# Patient Record
Sex: Female | Born: 1935 | Race: Black or African American | Hispanic: No | Smoking: Never smoker
Health system: Southern US, Community
[De-identification: ages and names within clinical notes are randomized; demographics above are authoritative.]

## PROBLEM LIST (undated history)

## (undated) ENCOUNTER — Emergency Department (HOSPITAL_COMMUNITY): Admission: EM | Payer: PRIVATE HEALTH INSURANCE | Source: Home / Self Care

## (undated) DIAGNOSIS — E785 Hyperlipidemia, unspecified: Secondary | ICD-10-CM

## (undated) DIAGNOSIS — I639 Cerebral infarction, unspecified: Secondary | ICD-10-CM

## (undated) DIAGNOSIS — E119 Type 2 diabetes mellitus without complications: Secondary | ICD-10-CM

## (undated) DIAGNOSIS — Z86718 Personal history of other venous thrombosis and embolism: Secondary | ICD-10-CM

## (undated) DIAGNOSIS — H409 Unspecified glaucoma: Secondary | ICD-10-CM

## (undated) DIAGNOSIS — D649 Anemia, unspecified: Secondary | ICD-10-CM

## (undated) DIAGNOSIS — H5440 Blindness, one eye, unspecified eye: Secondary | ICD-10-CM

## (undated) DIAGNOSIS — M109 Gout, unspecified: Secondary | ICD-10-CM

## (undated) DIAGNOSIS — N183 Chronic kidney disease, stage 3 unspecified: Secondary | ICD-10-CM

## (undated) DIAGNOSIS — I10 Essential (primary) hypertension: Secondary | ICD-10-CM

## (undated) DIAGNOSIS — N189 Chronic kidney disease, unspecified: Secondary | ICD-10-CM

## (undated) DIAGNOSIS — K8061 Calculus of gallbladder and bile duct with cholecystitis, unspecified, with obstruction: Secondary | ICD-10-CM

## (undated) HISTORY — DX: Chronic kidney disease, unspecified: N18.9

## (undated) HISTORY — DX: Essential (primary) hypertension: I10

## (undated) HISTORY — DX: Cerebral infarction, unspecified: I63.9

## (undated) HISTORY — DX: Blindness, one eye, unspecified eye: H54.40

## (undated) HISTORY — DX: Personal history of other venous thrombosis and embolism: Z86.718

## (undated) HISTORY — PX: CATARACT EXTRACTION: SUR2

## (undated) HISTORY — PX: ABDOMINAL HYSTERECTOMY: SHX81

## (undated) HISTORY — DX: Unspecified glaucoma: H40.9

## (undated) HISTORY — DX: Hyperlipidemia, unspecified: E78.5

## (undated) HISTORY — DX: Anemia, unspecified: D64.9

---

## 1989-03-26 DIAGNOSIS — I639 Cerebral infarction, unspecified: Secondary | ICD-10-CM

## 1989-03-26 HISTORY — DX: Cerebral infarction, unspecified: I63.9

## 1997-11-01 ENCOUNTER — Encounter: Admission: RE | Admit: 1997-11-01 | Discharge: 1997-11-01 | Payer: Self-pay | Admitting: Internal Medicine

## 1997-11-11 ENCOUNTER — Encounter: Admission: RE | Admit: 1997-11-11 | Discharge: 1997-11-11 | Payer: Self-pay | Admitting: Internal Medicine

## 1997-11-22 ENCOUNTER — Encounter: Admission: RE | Admit: 1997-11-22 | Discharge: 1997-11-22 | Payer: Self-pay | Admitting: Internal Medicine

## 1997-12-24 ENCOUNTER — Encounter: Admission: RE | Admit: 1997-12-24 | Discharge: 1997-12-24 | Payer: Self-pay | Admitting: Internal Medicine

## 1998-02-17 ENCOUNTER — Encounter: Admission: RE | Admit: 1998-02-17 | Discharge: 1998-02-17 | Payer: Self-pay | Admitting: Internal Medicine

## 1998-02-28 ENCOUNTER — Encounter: Admission: RE | Admit: 1998-02-28 | Discharge: 1998-02-28 | Payer: Self-pay | Admitting: Internal Medicine

## 1998-04-18 ENCOUNTER — Encounter: Admission: RE | Admit: 1998-04-18 | Discharge: 1998-04-18 | Payer: Self-pay | Admitting: Internal Medicine

## 1998-04-28 ENCOUNTER — Ambulatory Visit (HOSPITAL_COMMUNITY): Admission: RE | Admit: 1998-04-28 | Discharge: 1998-04-28 | Payer: Self-pay | Admitting: Internal Medicine

## 1998-05-06 ENCOUNTER — Encounter: Payer: Self-pay | Admitting: Hematology and Oncology

## 1998-05-06 ENCOUNTER — Ambulatory Visit (HOSPITAL_COMMUNITY): Admission: RE | Admit: 1998-05-06 | Discharge: 1998-05-06 | Payer: Self-pay | Admitting: Hematology and Oncology

## 1998-05-06 ENCOUNTER — Encounter: Admission: RE | Admit: 1998-05-06 | Discharge: 1998-05-06 | Payer: Self-pay | Admitting: Hematology and Oncology

## 1998-07-08 ENCOUNTER — Encounter: Admission: RE | Admit: 1998-07-08 | Discharge: 1998-07-08 | Payer: Self-pay | Admitting: Internal Medicine

## 1998-08-11 ENCOUNTER — Encounter: Admission: RE | Admit: 1998-08-11 | Discharge: 1998-08-11 | Payer: Self-pay | Admitting: Internal Medicine

## 1998-10-15 ENCOUNTER — Encounter: Admission: RE | Admit: 1998-10-15 | Discharge: 1998-10-15 | Payer: Self-pay | Admitting: Internal Medicine

## 1998-12-11 ENCOUNTER — Encounter: Admission: RE | Admit: 1998-12-11 | Discharge: 1998-12-11 | Payer: Self-pay | Admitting: Internal Medicine

## 1998-12-31 ENCOUNTER — Encounter: Payer: Self-pay | Admitting: Internal Medicine

## 1998-12-31 ENCOUNTER — Ambulatory Visit (HOSPITAL_COMMUNITY): Admission: RE | Admit: 1998-12-31 | Discharge: 1998-12-31 | Payer: Self-pay

## 1999-01-26 ENCOUNTER — Encounter: Admission: RE | Admit: 1999-01-26 | Discharge: 1999-01-26 | Payer: Self-pay | Admitting: Internal Medicine

## 1999-02-24 ENCOUNTER — Encounter: Admission: RE | Admit: 1999-02-24 | Discharge: 1999-02-24 | Payer: Self-pay | Admitting: Hematology and Oncology

## 1999-03-11 ENCOUNTER — Encounter: Admission: RE | Admit: 1999-03-11 | Discharge: 1999-03-11 | Payer: Self-pay | Admitting: *Deleted

## 1999-04-22 ENCOUNTER — Encounter: Admission: RE | Admit: 1999-04-22 | Discharge: 1999-04-22 | Payer: Self-pay | Admitting: Hematology and Oncology

## 1999-05-29 ENCOUNTER — Encounter: Admission: RE | Admit: 1999-05-29 | Discharge: 1999-05-29 | Payer: Self-pay | Admitting: Internal Medicine

## 1999-06-12 ENCOUNTER — Encounter: Admission: RE | Admit: 1999-06-12 | Discharge: 1999-06-12 | Payer: Self-pay | Admitting: Hematology and Oncology

## 1999-07-13 ENCOUNTER — Encounter: Admission: RE | Admit: 1999-07-13 | Discharge: 1999-07-13 | Payer: Self-pay | Admitting: Internal Medicine

## 1999-08-21 ENCOUNTER — Encounter: Admission: RE | Admit: 1999-08-21 | Discharge: 1999-08-21 | Payer: Self-pay | Admitting: Internal Medicine

## 1999-09-02 ENCOUNTER — Encounter: Admission: RE | Admit: 1999-09-02 | Discharge: 1999-09-02 | Payer: Self-pay | Admitting: Internal Medicine

## 1999-09-11 ENCOUNTER — Encounter: Admission: RE | Admit: 1999-09-11 | Discharge: 1999-09-11 | Payer: Self-pay | Admitting: Internal Medicine

## 1999-09-20 ENCOUNTER — Inpatient Hospital Stay (HOSPITAL_COMMUNITY): Admission: EM | Admit: 1999-09-20 | Discharge: 1999-09-21 | Payer: Self-pay | Admitting: Emergency Medicine

## 1999-09-20 ENCOUNTER — Encounter: Payer: Self-pay | Admitting: Emergency Medicine

## 1999-11-02 ENCOUNTER — Encounter: Admission: RE | Admit: 1999-11-02 | Discharge: 1999-11-02 | Payer: Self-pay | Admitting: Internal Medicine

## 1999-12-24 ENCOUNTER — Encounter: Admission: RE | Admit: 1999-12-24 | Discharge: 1999-12-24 | Payer: Self-pay | Admitting: Internal Medicine

## 2000-02-11 ENCOUNTER — Encounter: Admission: RE | Admit: 2000-02-11 | Discharge: 2000-02-11 | Payer: Self-pay | Admitting: Hematology and Oncology

## 2000-03-24 ENCOUNTER — Encounter: Admission: RE | Admit: 2000-03-24 | Discharge: 2000-03-24 | Payer: Self-pay | Admitting: Hematology and Oncology

## 2000-03-31 ENCOUNTER — Emergency Department (HOSPITAL_COMMUNITY): Admission: EM | Admit: 2000-03-31 | Discharge: 2000-03-31 | Payer: Self-pay | Admitting: Emergency Medicine

## 2000-03-31 ENCOUNTER — Encounter: Payer: Self-pay | Admitting: Emergency Medicine

## 2000-04-04 ENCOUNTER — Encounter: Admission: RE | Admit: 2000-04-04 | Discharge: 2000-04-04 | Payer: Self-pay | Admitting: Internal Medicine

## 2000-04-25 ENCOUNTER — Encounter: Admission: RE | Admit: 2000-04-25 | Discharge: 2000-04-25 | Payer: Self-pay | Admitting: Internal Medicine

## 2000-05-26 ENCOUNTER — Encounter: Admission: RE | Admit: 2000-05-26 | Discharge: 2000-05-26 | Payer: Self-pay | Admitting: Internal Medicine

## 2000-06-06 ENCOUNTER — Encounter: Admission: RE | Admit: 2000-06-06 | Discharge: 2000-06-06 | Payer: Self-pay | Admitting: Hematology and Oncology

## 2000-06-23 ENCOUNTER — Encounter: Admission: RE | Admit: 2000-06-23 | Discharge: 2000-06-23 | Payer: Self-pay | Admitting: Internal Medicine

## 2000-07-07 ENCOUNTER — Encounter: Admission: RE | Admit: 2000-07-07 | Discharge: 2000-07-07 | Payer: Self-pay | Admitting: Internal Medicine

## 2000-08-01 ENCOUNTER — Encounter: Admission: RE | Admit: 2000-08-01 | Discharge: 2000-08-01 | Payer: Self-pay | Admitting: Internal Medicine

## 2000-09-01 ENCOUNTER — Encounter: Admission: RE | Admit: 2000-09-01 | Discharge: 2000-09-01 | Payer: Self-pay | Admitting: Internal Medicine

## 2000-10-03 ENCOUNTER — Encounter: Admission: RE | Admit: 2000-10-03 | Discharge: 2000-10-03 | Payer: Self-pay | Admitting: Internal Medicine

## 2000-11-03 ENCOUNTER — Encounter: Admission: RE | Admit: 2000-11-03 | Discharge: 2000-11-03 | Payer: Self-pay

## 2000-12-22 ENCOUNTER — Encounter: Admission: RE | Admit: 2000-12-22 | Discharge: 2000-12-22 | Payer: Self-pay | Admitting: Internal Medicine

## 2001-01-09 ENCOUNTER — Encounter: Admission: RE | Admit: 2001-01-09 | Discharge: 2001-01-09 | Payer: Self-pay | Admitting: Internal Medicine

## 2001-02-06 ENCOUNTER — Encounter: Admission: RE | Admit: 2001-02-06 | Discharge: 2001-02-06 | Payer: Self-pay | Admitting: Internal Medicine

## 2001-02-22 ENCOUNTER — Encounter: Admission: RE | Admit: 2001-02-22 | Discharge: 2001-02-22 | Payer: Self-pay | Admitting: Internal Medicine

## 2001-03-01 ENCOUNTER — Encounter: Admission: RE | Admit: 2001-03-01 | Discharge: 2001-03-01 | Payer: Self-pay | Admitting: Internal Medicine

## 2001-03-03 ENCOUNTER — Ambulatory Visit (HOSPITAL_COMMUNITY): Admission: RE | Admit: 2001-03-03 | Discharge: 2001-03-03 | Payer: Self-pay | Admitting: Internal Medicine

## 2001-03-09 ENCOUNTER — Encounter: Payer: Self-pay | Admitting: Obstetrics

## 2001-03-09 ENCOUNTER — Encounter: Admission: RE | Admit: 2001-03-09 | Discharge: 2001-03-09 | Payer: Self-pay | Admitting: Obstetrics

## 2001-03-13 ENCOUNTER — Encounter: Admission: RE | Admit: 2001-03-13 | Discharge: 2001-03-13 | Payer: Self-pay | Admitting: Internal Medicine

## 2001-03-30 ENCOUNTER — Encounter: Admission: RE | Admit: 2001-03-30 | Discharge: 2001-03-30 | Payer: Self-pay | Admitting: Internal Medicine

## 2001-05-01 ENCOUNTER — Encounter: Admission: RE | Admit: 2001-05-01 | Discharge: 2001-05-01 | Payer: Self-pay | Admitting: Internal Medicine

## 2001-05-22 ENCOUNTER — Encounter: Admission: RE | Admit: 2001-05-22 | Discharge: 2001-05-22 | Payer: Self-pay | Admitting: Internal Medicine

## 2001-06-12 ENCOUNTER — Encounter: Admission: RE | Admit: 2001-06-12 | Discharge: 2001-06-12 | Payer: Self-pay | Admitting: Internal Medicine

## 2001-06-19 ENCOUNTER — Encounter: Admission: RE | Admit: 2001-06-19 | Discharge: 2001-06-19 | Payer: Self-pay | Admitting: Internal Medicine

## 2001-06-26 ENCOUNTER — Encounter: Admission: RE | Admit: 2001-06-26 | Discharge: 2001-06-26 | Payer: Self-pay | Admitting: Internal Medicine

## 2001-07-17 ENCOUNTER — Encounter: Admission: RE | Admit: 2001-07-17 | Discharge: 2001-07-17 | Payer: Self-pay | Admitting: Internal Medicine

## 2001-07-24 ENCOUNTER — Encounter: Admission: RE | Admit: 2001-07-24 | Discharge: 2001-07-24 | Payer: Self-pay

## 2001-08-14 ENCOUNTER — Encounter: Admission: RE | Admit: 2001-08-14 | Discharge: 2001-08-14 | Payer: Self-pay

## 2001-08-28 ENCOUNTER — Encounter: Admission: RE | Admit: 2001-08-28 | Discharge: 2001-08-28 | Payer: Self-pay | Admitting: Internal Medicine

## 2001-09-18 ENCOUNTER — Encounter: Admission: RE | Admit: 2001-09-18 | Discharge: 2001-09-18 | Payer: Self-pay | Admitting: Internal Medicine

## 2001-09-25 ENCOUNTER — Encounter: Admission: RE | Admit: 2001-09-25 | Discharge: 2001-09-25 | Payer: Self-pay | Admitting: Internal Medicine

## 2001-10-04 ENCOUNTER — Encounter: Admission: RE | Admit: 2001-10-04 | Discharge: 2001-10-04 | Payer: Self-pay | Admitting: *Deleted

## 2001-10-05 ENCOUNTER — Encounter: Admission: RE | Admit: 2001-10-05 | Discharge: 2001-10-05 | Payer: Self-pay

## 2001-10-30 ENCOUNTER — Encounter: Admission: RE | Admit: 2001-10-30 | Discharge: 2001-10-30 | Payer: Self-pay | Admitting: Internal Medicine

## 2001-11-21 ENCOUNTER — Ambulatory Visit (HOSPITAL_COMMUNITY): Admission: RE | Admit: 2001-11-21 | Discharge: 2001-11-21 | Payer: Self-pay | Admitting: *Deleted

## 2001-11-21 ENCOUNTER — Encounter: Payer: Self-pay | Admitting: *Deleted

## 2001-11-27 ENCOUNTER — Encounter: Admission: RE | Admit: 2001-11-27 | Discharge: 2001-11-27 | Payer: Self-pay | Admitting: Internal Medicine

## 2002-01-04 ENCOUNTER — Encounter: Admission: RE | Admit: 2002-01-04 | Discharge: 2002-01-04 | Payer: Self-pay | Admitting: Internal Medicine

## 2002-01-08 ENCOUNTER — Encounter: Admission: RE | Admit: 2002-01-08 | Discharge: 2002-01-08 | Payer: Self-pay | Admitting: Internal Medicine

## 2002-02-05 ENCOUNTER — Encounter: Admission: RE | Admit: 2002-02-05 | Discharge: 2002-02-05 | Payer: Self-pay | Admitting: Internal Medicine

## 2002-02-19 ENCOUNTER — Encounter: Admission: RE | Admit: 2002-02-19 | Discharge: 2002-02-19 | Payer: Self-pay | Admitting: Internal Medicine

## 2002-03-19 ENCOUNTER — Encounter: Admission: RE | Admit: 2002-03-19 | Discharge: 2002-03-19 | Payer: Self-pay | Admitting: Internal Medicine

## 2002-05-10 ENCOUNTER — Encounter: Admission: RE | Admit: 2002-05-10 | Discharge: 2002-05-10 | Payer: Self-pay | Admitting: Internal Medicine

## 2002-05-21 ENCOUNTER — Encounter: Admission: RE | Admit: 2002-05-21 | Discharge: 2002-05-21 | Payer: Self-pay | Admitting: Internal Medicine

## 2002-06-11 ENCOUNTER — Encounter: Admission: RE | Admit: 2002-06-11 | Discharge: 2002-06-11 | Payer: Self-pay | Admitting: Internal Medicine

## 2002-06-25 ENCOUNTER — Encounter: Admission: RE | Admit: 2002-06-25 | Discharge: 2002-06-25 | Payer: Self-pay | Admitting: Internal Medicine

## 2002-06-27 ENCOUNTER — Encounter: Admission: RE | Admit: 2002-06-27 | Discharge: 2002-09-25 | Payer: Self-pay | Admitting: *Deleted

## 2002-07-09 ENCOUNTER — Encounter: Admission: RE | Admit: 2002-07-09 | Discharge: 2002-07-09 | Payer: Self-pay | Admitting: Internal Medicine

## 2002-09-18 ENCOUNTER — Encounter: Admission: RE | Admit: 2002-09-18 | Discharge: 2002-09-18 | Payer: Self-pay | Admitting: Internal Medicine

## 2002-09-27 ENCOUNTER — Encounter: Admission: RE | Admit: 2002-09-27 | Discharge: 2002-09-27 | Payer: Self-pay | Admitting: Internal Medicine

## 2002-10-02 ENCOUNTER — Ambulatory Visit (HOSPITAL_COMMUNITY): Admission: RE | Admit: 2002-10-02 | Discharge: 2002-10-02 | Payer: Self-pay | Admitting: Internal Medicine

## 2002-10-02 ENCOUNTER — Encounter (INDEPENDENT_AMBULATORY_CARE_PROVIDER_SITE_OTHER): Payer: Self-pay | Admitting: Cardiology

## 2002-10-04 ENCOUNTER — Encounter: Admission: RE | Admit: 2002-10-04 | Discharge: 2002-10-04 | Payer: Self-pay | Admitting: Internal Medicine

## 2002-11-05 ENCOUNTER — Encounter: Admission: RE | Admit: 2002-11-05 | Discharge: 2002-11-05 | Payer: Self-pay | Admitting: Infectious Diseases

## 2002-11-26 ENCOUNTER — Encounter: Admission: RE | Admit: 2002-11-26 | Discharge: 2002-11-26 | Payer: Self-pay | Admitting: Internal Medicine

## 2002-12-24 ENCOUNTER — Encounter: Admission: RE | Admit: 2002-12-24 | Discharge: 2002-12-24 | Payer: Self-pay | Admitting: Internal Medicine

## 2003-01-21 ENCOUNTER — Encounter: Admission: RE | Admit: 2003-01-21 | Discharge: 2003-01-21 | Payer: Self-pay | Admitting: Internal Medicine

## 2003-02-11 ENCOUNTER — Encounter: Admission: RE | Admit: 2003-02-11 | Discharge: 2003-02-11 | Payer: Self-pay | Admitting: Internal Medicine

## 2003-03-11 ENCOUNTER — Encounter: Admission: RE | Admit: 2003-03-11 | Discharge: 2003-03-11 | Payer: Self-pay | Admitting: Internal Medicine

## 2003-04-18 ENCOUNTER — Encounter: Admission: RE | Admit: 2003-04-18 | Discharge: 2003-04-18 | Payer: Self-pay | Admitting: Internal Medicine

## 2003-04-24 ENCOUNTER — Ambulatory Visit (HOSPITAL_COMMUNITY): Admission: RE | Admit: 2003-04-24 | Discharge: 2003-04-24 | Payer: Self-pay | Admitting: Internal Medicine

## 2003-05-01 ENCOUNTER — Ambulatory Visit (HOSPITAL_COMMUNITY): Admission: RE | Admit: 2003-05-01 | Discharge: 2003-05-01 | Payer: Self-pay | Admitting: Internal Medicine

## 2003-05-06 ENCOUNTER — Encounter: Payer: Self-pay | Admitting: Internal Medicine

## 2003-05-06 ENCOUNTER — Ambulatory Visit (HOSPITAL_COMMUNITY): Admission: RE | Admit: 2003-05-06 | Discharge: 2003-05-06 | Payer: Self-pay | Admitting: Internal Medicine

## 2003-05-16 ENCOUNTER — Encounter: Admission: RE | Admit: 2003-05-16 | Discharge: 2003-05-16 | Payer: Self-pay | Admitting: Internal Medicine

## 2003-05-24 ENCOUNTER — Encounter: Admission: RE | Admit: 2003-05-24 | Discharge: 2003-05-24 | Payer: Self-pay | Admitting: Internal Medicine

## 2003-05-29 ENCOUNTER — Encounter: Admission: RE | Admit: 2003-05-29 | Discharge: 2003-05-29 | Payer: Self-pay | Admitting: Internal Medicine

## 2003-06-13 ENCOUNTER — Encounter: Admission: RE | Admit: 2003-06-13 | Discharge: 2003-06-13 | Payer: Self-pay | Admitting: Internal Medicine

## 2003-06-24 ENCOUNTER — Encounter: Admission: RE | Admit: 2003-06-24 | Discharge: 2003-06-24 | Payer: Self-pay | Admitting: Internal Medicine

## 2003-07-15 ENCOUNTER — Encounter: Admission: RE | Admit: 2003-07-15 | Discharge: 2003-07-15 | Payer: Self-pay | Admitting: Internal Medicine

## 2003-08-12 ENCOUNTER — Encounter: Admission: RE | Admit: 2003-08-12 | Discharge: 2003-08-12 | Payer: Self-pay | Admitting: Internal Medicine

## 2003-08-19 ENCOUNTER — Encounter: Admission: RE | Admit: 2003-08-19 | Discharge: 2003-08-19 | Payer: Self-pay | Admitting: Internal Medicine

## 2003-09-02 ENCOUNTER — Encounter: Admission: RE | Admit: 2003-09-02 | Discharge: 2003-09-02 | Payer: Self-pay | Admitting: Internal Medicine

## 2003-09-23 ENCOUNTER — Encounter: Admission: RE | Admit: 2003-09-23 | Discharge: 2003-09-23 | Payer: Self-pay | Admitting: Internal Medicine

## 2003-09-26 ENCOUNTER — Encounter: Admission: RE | Admit: 2003-09-26 | Discharge: 2003-09-26 | Payer: Self-pay | Admitting: Internal Medicine

## 2003-10-21 ENCOUNTER — Encounter: Admission: RE | Admit: 2003-10-21 | Discharge: 2003-10-21 | Payer: Self-pay | Admitting: Internal Medicine

## 2003-11-14 ENCOUNTER — Encounter: Admission: RE | Admit: 2003-11-14 | Discharge: 2003-11-14 | Payer: Self-pay | Admitting: Internal Medicine

## 2003-12-02 ENCOUNTER — Encounter: Admission: RE | Admit: 2003-12-02 | Discharge: 2003-12-02 | Payer: Self-pay | Admitting: Internal Medicine

## 2003-12-26 ENCOUNTER — Encounter: Admission: RE | Admit: 2003-12-26 | Discharge: 2003-12-26 | Payer: Self-pay | Admitting: Internal Medicine

## 2004-01-20 ENCOUNTER — Encounter: Admission: RE | Admit: 2004-01-20 | Discharge: 2004-01-20 | Payer: Self-pay | Admitting: Internal Medicine

## 2004-03-02 ENCOUNTER — Encounter: Admission: RE | Admit: 2004-03-02 | Discharge: 2004-03-02 | Payer: Self-pay | Admitting: Internal Medicine

## 2004-03-23 ENCOUNTER — Encounter: Admission: RE | Admit: 2004-03-23 | Discharge: 2004-03-23 | Payer: Self-pay | Admitting: Internal Medicine

## 2004-04-20 ENCOUNTER — Ambulatory Visit: Payer: Self-pay | Admitting: Internal Medicine

## 2004-05-13 ENCOUNTER — Ambulatory Visit: Payer: Self-pay | Admitting: Internal Medicine

## 2004-05-18 ENCOUNTER — Ambulatory Visit: Payer: Self-pay | Admitting: Internal Medicine

## 2004-06-08 ENCOUNTER — Ambulatory Visit: Payer: Self-pay | Admitting: Internal Medicine

## 2004-07-06 ENCOUNTER — Ambulatory Visit: Payer: Self-pay | Admitting: Internal Medicine

## 2004-07-26 DIAGNOSIS — N189 Chronic kidney disease, unspecified: Secondary | ICD-10-CM

## 2004-07-26 HISTORY — DX: Chronic kidney disease, unspecified: N18.9

## 2004-07-30 ENCOUNTER — Ambulatory Visit: Payer: Self-pay | Admitting: Internal Medicine

## 2004-07-30 ENCOUNTER — Ambulatory Visit (HOSPITAL_COMMUNITY): Admission: RE | Admit: 2004-07-30 | Discharge: 2004-07-30 | Payer: Self-pay | Admitting: Internal Medicine

## 2004-08-03 ENCOUNTER — Ambulatory Visit: Payer: Self-pay | Admitting: Internal Medicine

## 2004-08-05 ENCOUNTER — Ambulatory Visit: Payer: Self-pay | Admitting: Internal Medicine

## 2004-08-12 ENCOUNTER — Ambulatory Visit: Payer: Self-pay | Admitting: Internal Medicine

## 2004-08-17 ENCOUNTER — Ambulatory Visit: Payer: Self-pay | Admitting: Internal Medicine

## 2004-09-01 ENCOUNTER — Ambulatory Visit: Payer: Self-pay | Admitting: Internal Medicine

## 2004-09-14 ENCOUNTER — Ambulatory Visit: Payer: Self-pay | Admitting: Internal Medicine

## 2004-10-12 ENCOUNTER — Ambulatory Visit: Payer: Self-pay | Admitting: Internal Medicine

## 2004-10-20 ENCOUNTER — Ambulatory Visit: Payer: Self-pay | Admitting: Internal Medicine

## 2004-11-02 ENCOUNTER — Ambulatory Visit: Payer: Self-pay | Admitting: Internal Medicine

## 2004-11-12 ENCOUNTER — Emergency Department (HOSPITAL_COMMUNITY): Admission: EM | Admit: 2004-11-12 | Discharge: 2004-11-12 | Payer: Self-pay | Admitting: Emergency Medicine

## 2004-11-13 ENCOUNTER — Emergency Department (HOSPITAL_COMMUNITY): Admission: EM | Admit: 2004-11-13 | Discharge: 2004-11-13 | Payer: Self-pay | Admitting: *Deleted

## 2004-11-30 ENCOUNTER — Ambulatory Visit: Payer: Self-pay | Admitting: Internal Medicine

## 2004-12-10 ENCOUNTER — Ambulatory Visit: Payer: Self-pay | Admitting: Internal Medicine

## 2004-12-17 ENCOUNTER — Ambulatory Visit: Payer: Self-pay | Admitting: Internal Medicine

## 2004-12-23 ENCOUNTER — Ambulatory Visit (HOSPITAL_COMMUNITY): Admission: RE | Admit: 2004-12-23 | Discharge: 2004-12-23 | Payer: Self-pay | Admitting: Internal Medicine

## 2004-12-28 ENCOUNTER — Ambulatory Visit: Payer: Self-pay | Admitting: Internal Medicine

## 2005-01-21 ENCOUNTER — Ambulatory Visit: Payer: Self-pay | Admitting: Internal Medicine

## 2005-03-01 ENCOUNTER — Ambulatory Visit: Payer: Self-pay | Admitting: Internal Medicine

## 2005-03-10 ENCOUNTER — Ambulatory Visit: Payer: Self-pay | Admitting: Internal Medicine

## 2005-03-24 ENCOUNTER — Ambulatory Visit: Payer: Self-pay | Admitting: Internal Medicine

## 2005-04-02 ENCOUNTER — Ambulatory Visit: Payer: Self-pay | Admitting: Cardiology

## 2005-04-05 ENCOUNTER — Ambulatory Visit: Payer: Self-pay | Admitting: Internal Medicine

## 2005-04-21 ENCOUNTER — Ambulatory Visit (HOSPITAL_COMMUNITY): Admission: RE | Admit: 2005-04-21 | Discharge: 2005-04-21 | Payer: Self-pay | Admitting: Internal Medicine

## 2005-04-26 ENCOUNTER — Ambulatory Visit: Payer: Self-pay | Admitting: Hospitalist

## 2005-05-31 ENCOUNTER — Ambulatory Visit: Payer: Self-pay | Admitting: Internal Medicine

## 2005-06-14 ENCOUNTER — Ambulatory Visit: Payer: Self-pay | Admitting: Internal Medicine

## 2005-07-05 ENCOUNTER — Ambulatory Visit: Payer: Self-pay | Admitting: Internal Medicine

## 2005-07-29 ENCOUNTER — Ambulatory Visit: Payer: Self-pay | Admitting: Internal Medicine

## 2005-08-23 ENCOUNTER — Ambulatory Visit: Payer: Self-pay | Admitting: Internal Medicine

## 2005-09-01 ENCOUNTER — Ambulatory Visit: Payer: Self-pay | Admitting: Cardiology

## 2005-09-27 ENCOUNTER — Ambulatory Visit: Payer: Self-pay | Admitting: Internal Medicine

## 2005-10-18 ENCOUNTER — Ambulatory Visit: Payer: Self-pay | Admitting: Internal Medicine

## 2005-10-27 ENCOUNTER — Ambulatory Visit: Payer: Self-pay | Admitting: Internal Medicine

## 2005-11-11 ENCOUNTER — Ambulatory Visit: Payer: Self-pay | Admitting: Internal Medicine

## 2005-11-18 ENCOUNTER — Ambulatory Visit (HOSPITAL_COMMUNITY): Admission: RE | Admit: 2005-11-18 | Discharge: 2005-11-18 | Payer: Self-pay | Admitting: Internal Medicine

## 2005-11-29 ENCOUNTER — Ambulatory Visit: Payer: Self-pay | Admitting: Internal Medicine

## 2005-11-29 ENCOUNTER — Ambulatory Visit: Payer: Self-pay | Admitting: Gastroenterology

## 2005-12-27 ENCOUNTER — Ambulatory Visit: Payer: Self-pay | Admitting: Gastroenterology

## 2006-01-10 ENCOUNTER — Ambulatory Visit: Payer: Self-pay | Admitting: Internal Medicine

## 2006-01-31 ENCOUNTER — Ambulatory Visit: Payer: Self-pay | Admitting: Hospitalist

## 2006-02-28 ENCOUNTER — Ambulatory Visit: Payer: Self-pay | Admitting: Internal Medicine

## 2006-03-21 ENCOUNTER — Ambulatory Visit: Payer: Self-pay | Admitting: Hospitalist

## 2006-04-12 ENCOUNTER — Ambulatory Visit: Payer: Self-pay | Admitting: Internal Medicine

## 2006-04-25 DIAGNOSIS — I639 Cerebral infarction, unspecified: Secondary | ICD-10-CM

## 2006-04-25 HISTORY — DX: Cerebral infarction, unspecified: I63.9

## 2006-05-04 ENCOUNTER — Ambulatory Visit: Payer: Self-pay | Admitting: Hospitalist

## 2006-05-04 ENCOUNTER — Observation Stay (HOSPITAL_COMMUNITY): Admission: AD | Admit: 2006-05-04 | Discharge: 2006-05-05 | Payer: Self-pay | Admitting: Hospitalist

## 2006-05-04 ENCOUNTER — Ambulatory Visit: Payer: Self-pay | Admitting: Internal Medicine

## 2006-05-09 ENCOUNTER — Ambulatory Visit: Payer: Self-pay | Admitting: Internal Medicine

## 2006-05-18 ENCOUNTER — Encounter (INDEPENDENT_AMBULATORY_CARE_PROVIDER_SITE_OTHER): Payer: Self-pay | Admitting: Unknown Physician Specialty

## 2006-05-18 ENCOUNTER — Ambulatory Visit: Payer: Self-pay | Admitting: Internal Medicine

## 2006-05-18 LAB — CONVERTED CEMR LAB
BUN: 28 mg/dL — ABNORMAL HIGH (ref 6–23)
Calcium: 8.9 mg/dL (ref 8.4–10.5)
Creatinine, Ser: 1.5 mg/dL — ABNORMAL HIGH (ref 0.40–1.20)
Glucose, Bld: 181 mg/dL — ABNORMAL HIGH (ref 70–99)
Hemoglobin: 9.6 g/dL — ABNORMAL LOW (ref 12.0–15.0)
MCHC: 33 g/dL (ref 30.0–36.0)
MCV: 93.6 fL (ref 78.0–100.0)
Potassium: 4.8 meq/L (ref 3.5–5.3)
RBC: 3.11 M/uL — ABNORMAL LOW (ref 3.87–5.11)
RDW: 13.6 % (ref 11.5–14.0)

## 2006-06-06 ENCOUNTER — Ambulatory Visit: Payer: Self-pay | Admitting: Hospitalist

## 2006-07-04 ENCOUNTER — Ambulatory Visit: Payer: Self-pay | Admitting: Internal Medicine

## 2006-07-14 ENCOUNTER — Emergency Department (HOSPITAL_COMMUNITY): Admission: EM | Admit: 2006-07-14 | Discharge: 2006-07-14 | Payer: Self-pay | Admitting: Emergency Medicine

## 2006-07-21 ENCOUNTER — Ambulatory Visit: Payer: Self-pay | Admitting: Internal Medicine

## 2006-07-27 ENCOUNTER — Ambulatory Visit: Payer: Self-pay | Admitting: Internal Medicine

## 2006-07-27 ENCOUNTER — Encounter (INDEPENDENT_AMBULATORY_CARE_PROVIDER_SITE_OTHER): Payer: Self-pay | Admitting: Pulmonary Disease

## 2006-07-27 LAB — CONVERTED CEMR LAB
Calcium: 9.3 mg/dL (ref 8.4–10.5)
Potassium: 5.1 meq/L (ref 3.5–5.3)
Sodium: 137 meq/L (ref 135–145)

## 2006-08-01 ENCOUNTER — Ambulatory Visit: Payer: Self-pay | Admitting: Internal Medicine

## 2006-08-03 ENCOUNTER — Ambulatory Visit: Payer: Self-pay | Admitting: Hospitalist

## 2006-08-22 ENCOUNTER — Ambulatory Visit: Payer: Self-pay | Admitting: Hospitalist

## 2006-09-19 ENCOUNTER — Ambulatory Visit: Payer: Self-pay | Admitting: Hospitalist

## 2006-09-19 DIAGNOSIS — I6930 Unspecified sequelae of cerebral infarction: Secondary | ICD-10-CM

## 2006-09-19 LAB — CONVERTED CEMR LAB: INR: 1.1

## 2006-10-03 ENCOUNTER — Ambulatory Visit: Payer: Self-pay | Admitting: Internal Medicine

## 2006-10-03 LAB — CONVERTED CEMR LAB: INR: 2.5

## 2006-10-31 ENCOUNTER — Ambulatory Visit: Payer: Self-pay | Admitting: Internal Medicine

## 2006-10-31 LAB — CONVERTED CEMR LAB

## 2006-11-16 ENCOUNTER — Encounter (INDEPENDENT_AMBULATORY_CARE_PROVIDER_SITE_OTHER): Payer: Self-pay | Admitting: Pulmonary Disease

## 2006-11-21 ENCOUNTER — Ambulatory Visit: Payer: Self-pay | Admitting: Hospitalist

## 2006-11-24 HISTORY — PX: TRANSTHORACIC ECHOCARDIOGRAM: SHX275

## 2006-12-01 ENCOUNTER — Telehealth (INDEPENDENT_AMBULATORY_CARE_PROVIDER_SITE_OTHER): Payer: Self-pay | Admitting: *Deleted

## 2006-12-12 ENCOUNTER — Ambulatory Visit: Payer: Self-pay | Admitting: Hospitalist

## 2006-12-12 ENCOUNTER — Encounter (INDEPENDENT_AMBULATORY_CARE_PROVIDER_SITE_OTHER): Payer: Self-pay | Admitting: Pulmonary Disease

## 2006-12-12 DIAGNOSIS — E785 Hyperlipidemia, unspecified: Secondary | ICD-10-CM

## 2006-12-12 DIAGNOSIS — I10 Essential (primary) hypertension: Secondary | ICD-10-CM

## 2006-12-12 DIAGNOSIS — D638 Anemia in other chronic diseases classified elsewhere: Secondary | ICD-10-CM

## 2006-12-12 DIAGNOSIS — E1159 Type 2 diabetes mellitus with other circulatory complications: Secondary | ICD-10-CM

## 2006-12-12 DIAGNOSIS — E119 Type 2 diabetes mellitus without complications: Secondary | ICD-10-CM | POA: Insufficient documentation

## 2006-12-12 LAB — CONVERTED CEMR LAB
BUN: 45 mg/dL — ABNORMAL HIGH (ref 6–23)
Creatinine, Ser: 2.24 mg/dL — ABNORMAL HIGH (ref 0.40–1.20)
Glucose, Bld: 127 mg/dL — ABNORMAL HIGH (ref 70–99)
HCT: 29.4 % — ABNORMAL LOW (ref 36.0–46.0)
Hemoglobin: 9.8 g/dL — ABNORMAL LOW (ref 12.0–15.0)
Hgb A1c MFr Bld: 6.8 %
MCHC: 33.2 g/dL (ref 30.0–36.0)
MCV: 92.5 fL (ref 78.0–100.0)
Potassium: 4.9 meq/L (ref 3.5–5.3)
Pro B Natriuretic peptide (BNP): 94 pg/mL (ref 0.0–100.0)
RBC: 3.18 M/uL — ABNORMAL LOW (ref 3.87–5.11)
RDW: 13.6 % (ref 11.5–14.0)

## 2006-12-22 ENCOUNTER — Ambulatory Visit (HOSPITAL_COMMUNITY): Admission: RE | Admit: 2006-12-22 | Discharge: 2006-12-22 | Payer: Self-pay | Admitting: Pulmonary Disease

## 2006-12-22 ENCOUNTER — Encounter: Payer: Self-pay | Admitting: Internal Medicine

## 2006-12-26 ENCOUNTER — Ambulatory Visit: Payer: Self-pay | Admitting: Internal Medicine

## 2006-12-26 LAB — CONVERTED CEMR LAB

## 2007-01-23 ENCOUNTER — Telehealth: Payer: Self-pay | Admitting: *Deleted

## 2007-01-30 ENCOUNTER — Ambulatory Visit: Payer: Self-pay | Admitting: Internal Medicine

## 2007-03-06 ENCOUNTER — Ambulatory Visit: Payer: Self-pay | Admitting: Infectious Disease

## 2007-03-06 LAB — CONVERTED CEMR LAB: INR: 3.6

## 2007-03-20 ENCOUNTER — Ambulatory Visit: Payer: Self-pay | Admitting: Internal Medicine

## 2007-03-20 LAB — CONVERTED CEMR LAB: INR: 2

## 2007-04-17 ENCOUNTER — Ambulatory Visit: Payer: Self-pay | Admitting: Internal Medicine

## 2007-04-17 LAB — CONVERTED CEMR LAB: INR: 3.2

## 2007-04-26 ENCOUNTER — Encounter: Payer: Self-pay | Admitting: Internal Medicine

## 2007-04-26 ENCOUNTER — Ambulatory Visit: Payer: Self-pay | Admitting: Hospitalist

## 2007-04-26 DIAGNOSIS — R609 Edema, unspecified: Secondary | ICD-10-CM

## 2007-04-26 DIAGNOSIS — I82409 Acute embolism and thrombosis of unspecified deep veins of unspecified lower extremity: Secondary | ICD-10-CM | POA: Insufficient documentation

## 2007-04-26 LAB — CONVERTED CEMR LAB: Blood Glucose, Fingerstick: 390

## 2007-05-02 ENCOUNTER — Telehealth: Payer: Self-pay | Admitting: *Deleted

## 2007-05-02 LAB — CONVERTED CEMR LAB
Alkaline Phosphatase: 197 units/L — ABNORMAL HIGH (ref 39–117)
BUN: 19 mg/dL (ref 6–23)
Basophils Relative: 1 % (ref 0–1)
Bilirubin Urine: NEGATIVE
Creatinine, Ser: 1.5 mg/dL — ABNORMAL HIGH (ref 0.40–1.20)
Eosinophils Absolute: 0.1 10*3/uL (ref 0.0–0.7)
Eosinophils Relative: 2 % (ref 0–5)
Glucose, Bld: 355 mg/dL — ABNORMAL HIGH (ref 70–99)
HCT: 35.5 % — ABNORMAL LOW (ref 36.0–46.0)
HDL: 47 mg/dL (ref >39)
Hemoglobin, Urine: NEGATIVE
Ketones, ur: NEGATIVE mg/dL
LDL Cholesterol: 111 mg/dL — ABNORMAL HIGH (ref 0–99)
Lymphs Abs: 3.1 10*3/uL (ref 0.7–3.3)
MCHC: 32.7 g/dL (ref 30.0–36.0)
MCV: 90.1 fL (ref 78.0–100.0)
Monocytes Relative: 7 % (ref 3–11)
Protein, ur: 30 mg/dL — AB
RBC: 3.94 M/uL (ref 3.87–5.11)
Specific Gravity, Urine: 1.023 (ref 1.005–1.03)
Total Bilirubin: 0.6 mg/dL (ref 0.3–1.2)
Total CHOL/HDL Ratio: 4
Triglycerides: 156 mg/dL — ABNORMAL HIGH (ref <150)
Urobilinogen, UA: 1 (ref 0.0–1.0)
VLDL: 31 mg/dL (ref 0–40)
WBC: 7 10*3/uL (ref 4.0–10.5)

## 2007-05-17 ENCOUNTER — Encounter: Payer: Self-pay | Admitting: Internal Medicine

## 2007-05-29 ENCOUNTER — Ambulatory Visit: Payer: Self-pay | Admitting: Hospitalist

## 2007-05-29 LAB — CONVERTED CEMR LAB: INR: 2.5

## 2007-06-19 ENCOUNTER — Ambulatory Visit: Payer: Self-pay | Admitting: Hospitalist

## 2007-07-24 ENCOUNTER — Ambulatory Visit: Payer: Self-pay | Admitting: Internal Medicine

## 2007-07-24 LAB — CONVERTED CEMR LAB

## 2007-08-28 ENCOUNTER — Ambulatory Visit: Payer: Self-pay | Admitting: Hospitalist

## 2007-09-12 ENCOUNTER — Telehealth (INDEPENDENT_AMBULATORY_CARE_PROVIDER_SITE_OTHER): Payer: Self-pay | Admitting: *Deleted

## 2007-09-12 ENCOUNTER — Encounter: Payer: Self-pay | Admitting: Internal Medicine

## 2007-09-12 ENCOUNTER — Ambulatory Visit: Payer: Self-pay | Admitting: Internal Medicine

## 2007-09-12 LAB — CONVERTED CEMR LAB
ALT: 11 units/L (ref 0–35)
AST: 14 units/L (ref 0–37)
Albumin: 4.1 g/dL (ref 3.5–5.2)
BUN: 18 mg/dL (ref 6–23)
Blood Glucose, Fingerstick: 236
Calcium: 9.2 mg/dL (ref 8.4–10.5)
Chloride: 104 meq/L (ref 96–112)
Hgb A1c MFr Bld: 8.5 %
Potassium: 4.4 meq/L (ref 3.5–5.3)

## 2007-09-18 ENCOUNTER — Ambulatory Visit: Payer: Self-pay | Admitting: Internal Medicine

## 2007-09-28 ENCOUNTER — Encounter: Payer: Self-pay | Admitting: Internal Medicine

## 2007-10-13 ENCOUNTER — Ambulatory Visit: Payer: Self-pay | Admitting: Hospitalist

## 2007-10-16 ENCOUNTER — Ambulatory Visit: Payer: Self-pay | Admitting: Hospitalist

## 2007-11-13 ENCOUNTER — Ambulatory Visit: Payer: Self-pay | Admitting: Internal Medicine

## 2007-11-13 LAB — CONVERTED CEMR LAB: INR: 2

## 2007-12-14 ENCOUNTER — Ambulatory Visit: Payer: Self-pay | Admitting: Internal Medicine

## 2007-12-14 LAB — CONVERTED CEMR LAB: INR: 5.1

## 2007-12-19 ENCOUNTER — Telehealth: Payer: Self-pay | Admitting: Internal Medicine

## 2007-12-25 ENCOUNTER — Encounter: Payer: Self-pay | Admitting: Internal Medicine

## 2007-12-25 ENCOUNTER — Ambulatory Visit: Payer: Self-pay | Admitting: *Deleted

## 2007-12-25 ENCOUNTER — Ambulatory Visit: Admission: RE | Admit: 2007-12-25 | Discharge: 2007-12-25 | Payer: Self-pay | Admitting: Internal Medicine

## 2007-12-25 ENCOUNTER — Ambulatory Visit: Payer: Self-pay | Admitting: Internal Medicine

## 2007-12-25 DIAGNOSIS — M79609 Pain in unspecified limb: Secondary | ICD-10-CM | POA: Insufficient documentation

## 2007-12-25 LAB — CONVERTED CEMR LAB: INR: 2.7

## 2007-12-26 ENCOUNTER — Telehealth: Payer: Self-pay | Admitting: Internal Medicine

## 2008-01-23 ENCOUNTER — Telehealth: Payer: Self-pay | Admitting: Internal Medicine

## 2008-01-29 ENCOUNTER — Ambulatory Visit: Payer: Self-pay | Admitting: Infectious Diseases

## 2008-02-07 ENCOUNTER — Encounter: Payer: Self-pay | Admitting: Internal Medicine

## 2008-02-26 ENCOUNTER — Ambulatory Visit: Payer: Self-pay | Admitting: Infectious Diseases

## 2008-02-26 ENCOUNTER — Telehealth: Payer: Self-pay | Admitting: Internal Medicine

## 2008-02-28 ENCOUNTER — Ambulatory Visit: Payer: Self-pay | Admitting: Internal Medicine

## 2008-03-25 ENCOUNTER — Ambulatory Visit: Payer: Self-pay | Admitting: Internal Medicine

## 2008-03-25 LAB — CONVERTED CEMR LAB: INR: 3.6

## 2008-04-22 ENCOUNTER — Telehealth: Payer: Self-pay | Admitting: Internal Medicine

## 2008-04-22 ENCOUNTER — Ambulatory Visit: Payer: Self-pay | Admitting: Internal Medicine

## 2008-04-22 LAB — CONVERTED CEMR LAB: INR: 2.4

## 2008-05-20 ENCOUNTER — Encounter: Payer: Self-pay | Admitting: Internal Medicine

## 2008-05-20 ENCOUNTER — Ambulatory Visit: Payer: Self-pay | Admitting: Internal Medicine

## 2008-05-20 LAB — CONVERTED CEMR LAB

## 2008-06-17 ENCOUNTER — Telehealth: Payer: Self-pay | Admitting: Internal Medicine

## 2008-06-24 ENCOUNTER — Ambulatory Visit: Payer: Self-pay | Admitting: Internal Medicine

## 2008-06-26 ENCOUNTER — Encounter: Payer: Self-pay | Admitting: Internal Medicine

## 2008-06-27 ENCOUNTER — Encounter: Payer: Self-pay | Admitting: Internal Medicine

## 2008-06-27 ENCOUNTER — Ambulatory Visit: Payer: Self-pay | Admitting: Internal Medicine

## 2008-07-01 ENCOUNTER — Telehealth: Payer: Self-pay | Admitting: Internal Medicine

## 2008-07-01 ENCOUNTER — Ambulatory Visit: Payer: Self-pay | Admitting: Internal Medicine

## 2008-07-01 LAB — CONVERTED CEMR LAB

## 2008-07-02 LAB — CONVERTED CEMR LAB
AST: 14 units/L (ref 0–37)
BUN: 29 mg/dL — ABNORMAL HIGH (ref 6–23)
Basophils Relative: 1 % (ref 0–1)
Calcium: 9 mg/dL (ref 8.4–10.5)
Chloride: 107 meq/L (ref 96–112)
Creatinine, Ser: 1.38 mg/dL — ABNORMAL HIGH (ref 0.40–1.20)
Eosinophils Absolute: 0.2 10*3/uL (ref 0.0–0.7)
Glucose, Bld: 138 mg/dL — ABNORMAL HIGH (ref 70–99)
HDL: 41 mg/dL (ref >39)
Hemoglobin: 10.6 g/dL — ABNORMAL LOW (ref 12.0–15.0)
Lymphs Abs: 2.7 10*3/uL (ref 0.7–4.0)
MCHC: 32.1 g/dL (ref 30.0–36.0)
MCV: 91.2 fL (ref 78.0–100.0)
Monocytes Absolute: 0.7 10*3/uL (ref 0.1–1.0)
Monocytes Relative: 11 % (ref 3–12)
Neutro Abs: 2.4 10*3/uL (ref 1.7–7.7)
RBC: 3.62 M/uL — ABNORMAL LOW (ref 3.87–5.11)
Total CHOL/HDL Ratio: 3.2
Triglycerides: 94 mg/dL (ref <150)

## 2008-07-10 ENCOUNTER — Encounter: Payer: Self-pay | Admitting: Internal Medicine

## 2008-07-29 ENCOUNTER — Ambulatory Visit: Payer: Self-pay | Admitting: Internal Medicine

## 2008-07-29 LAB — CONVERTED CEMR LAB: INR: 2.5

## 2008-08-27 ENCOUNTER — Telehealth: Payer: Self-pay | Admitting: Internal Medicine

## 2008-09-02 ENCOUNTER — Ambulatory Visit: Payer: Self-pay | Admitting: Internal Medicine

## 2008-09-02 ENCOUNTER — Encounter: Payer: Self-pay | Admitting: Internal Medicine

## 2008-09-02 LAB — CONVERTED CEMR LAB: Blood Glucose, Fingerstick: 227

## 2008-09-06 ENCOUNTER — Telehealth: Payer: Self-pay | Admitting: Internal Medicine

## 2008-09-09 ENCOUNTER — Ambulatory Visit: Payer: Self-pay | Admitting: *Deleted

## 2008-09-09 LAB — CONVERTED CEMR LAB: INR: 2.4

## 2008-09-11 ENCOUNTER — Encounter: Payer: Self-pay | Admitting: Internal Medicine

## 2008-09-22 ENCOUNTER — Encounter: Payer: Self-pay | Admitting: Internal Medicine

## 2008-09-30 ENCOUNTER — Telehealth: Payer: Self-pay | Admitting: Internal Medicine

## 2008-10-07 ENCOUNTER — Ambulatory Visit: Payer: Self-pay | Admitting: Internal Medicine

## 2008-10-07 LAB — CONVERTED CEMR LAB: INR: 3.1

## 2008-11-04 ENCOUNTER — Ambulatory Visit: Payer: Self-pay | Admitting: Internal Medicine

## 2008-11-04 ENCOUNTER — Telehealth: Payer: Self-pay | Admitting: Internal Medicine

## 2008-12-02 ENCOUNTER — Ambulatory Visit: Payer: Self-pay | Admitting: *Deleted

## 2008-12-02 LAB — CONVERTED CEMR LAB: INR: 2.8

## 2008-12-30 ENCOUNTER — Ambulatory Visit: Payer: Self-pay | Admitting: Internal Medicine

## 2008-12-30 LAB — CONVERTED CEMR LAB: INR: 2.9

## 2009-01-09 ENCOUNTER — Ambulatory Visit: Payer: Self-pay | Admitting: Internal Medicine

## 2009-01-09 DIAGNOSIS — H6120 Impacted cerumen, unspecified ear: Secondary | ICD-10-CM

## 2009-01-09 LAB — CONVERTED CEMR LAB: Blood Glucose, Fingerstick: 196

## 2009-01-20 ENCOUNTER — Ambulatory Visit: Payer: Self-pay | Admitting: Internal Medicine

## 2009-01-20 LAB — CONVERTED CEMR LAB

## 2009-02-13 ENCOUNTER — Telehealth: Payer: Self-pay | Admitting: Internal Medicine

## 2009-02-17 ENCOUNTER — Ambulatory Visit: Payer: Self-pay | Admitting: Internal Medicine

## 2009-02-17 LAB — CONVERTED CEMR LAB: INR: 1.6

## 2009-02-24 ENCOUNTER — Telehealth: Payer: Self-pay | Admitting: Internal Medicine

## 2009-03-10 ENCOUNTER — Ambulatory Visit: Payer: Self-pay | Admitting: Internal Medicine

## 2009-03-20 ENCOUNTER — Encounter: Payer: Self-pay | Admitting: Internal Medicine

## 2009-04-07 ENCOUNTER — Ambulatory Visit: Payer: Self-pay | Admitting: Infectious Diseases

## 2009-04-07 LAB — CONVERTED CEMR LAB

## 2009-04-28 ENCOUNTER — Telehealth: Payer: Self-pay | Admitting: Internal Medicine

## 2009-05-05 ENCOUNTER — Ambulatory Visit: Payer: Self-pay | Admitting: Internal Medicine

## 2009-05-05 LAB — CONVERTED CEMR LAB: INR: 4.8

## 2009-06-02 ENCOUNTER — Ambulatory Visit: Payer: Self-pay | Admitting: Internal Medicine

## 2009-06-02 LAB — CONVERTED CEMR LAB

## 2009-07-07 ENCOUNTER — Ambulatory Visit: Payer: Self-pay | Admitting: Internal Medicine

## 2009-07-07 LAB — CONVERTED CEMR LAB: INR: 3.2

## 2009-07-09 ENCOUNTER — Ambulatory Visit: Payer: Self-pay | Admitting: Internal Medicine

## 2009-07-09 DIAGNOSIS — M25569 Pain in unspecified knee: Secondary | ICD-10-CM

## 2009-07-09 LAB — CONVERTED CEMR LAB
BUN: 36 mg/dL — ABNORMAL HIGH (ref 6–23)
Basophils Relative: 0 % (ref 0–1)
CO2: 24 meq/L (ref 19–32)
Creatinine, Ser: 1.58 mg/dL — ABNORMAL HIGH (ref 0.40–1.20)
Eosinophils Absolute: 0.3 10*3/uL (ref 0.0–0.7)
Eosinophils Relative: 4 % (ref 0–5)
Glucose, Bld: 180 mg/dL — ABNORMAL HIGH (ref 70–99)
MCHC: 33 g/dL (ref 30.0–36.0)
MCV: 89.5 fL (ref >=78.0)
Monocytes Absolute: 0.7 10*3/uL (ref 0.1–1.0)
Monocytes Relative: 11 % (ref 3–12)
Neutrophils Relative %: 41 % — ABNORMAL LOW (ref 43–77)
RBC: 3.72 M/uL — ABNORMAL LOW (ref 3.87–5.11)
Total Bilirubin: 0.5 mg/dL (ref 0.3–1.2)
Vitamin B-12: 530 pg/mL (ref 211–911)

## 2009-08-04 ENCOUNTER — Ambulatory Visit: Payer: Self-pay | Admitting: Internal Medicine

## 2009-08-04 LAB — CONVERTED CEMR LAB

## 2009-08-14 ENCOUNTER — Ambulatory Visit: Payer: Self-pay | Admitting: Internal Medicine

## 2009-08-14 LAB — CONVERTED CEMR LAB: Blood Glucose, Fingerstick: 284

## 2009-08-22 ENCOUNTER — Telehealth: Payer: Self-pay | Admitting: Internal Medicine

## 2009-08-25 ENCOUNTER — Ambulatory Visit: Payer: Self-pay | Admitting: Internal Medicine

## 2009-08-25 LAB — CONVERTED CEMR LAB

## 2009-09-22 ENCOUNTER — Ambulatory Visit: Payer: Self-pay | Admitting: Internal Medicine

## 2009-10-20 ENCOUNTER — Ambulatory Visit: Payer: Self-pay | Admitting: Infectious Diseases

## 2009-11-10 ENCOUNTER — Ambulatory Visit: Payer: Self-pay | Admitting: Internal Medicine

## 2009-11-10 LAB — CONVERTED CEMR LAB: INR: 4.4

## 2009-11-24 ENCOUNTER — Ambulatory Visit: Payer: Self-pay | Admitting: Internal Medicine

## 2009-11-24 LAB — CONVERTED CEMR LAB
Blood Glucose, Fingerstick: 293
Hgb A1c MFr Bld: 7.7 %
INR: 2.7

## 2009-11-29 ENCOUNTER — Encounter: Payer: Self-pay | Admitting: Internal Medicine

## 2009-12-01 ENCOUNTER — Telehealth (INDEPENDENT_AMBULATORY_CARE_PROVIDER_SITE_OTHER): Payer: Self-pay | Admitting: *Deleted

## 2009-12-01 ENCOUNTER — Encounter: Payer: Self-pay | Admitting: Internal Medicine

## 2009-12-01 LAB — CONVERTED CEMR LAB
AST: 16 units/L (ref 0–37)
Albumin: 4.1 g/dL (ref 3.5–5.2)
Alkaline Phosphatase: 91 units/L (ref 39–117)
BUN: 39 mg/dL — ABNORMAL HIGH (ref 6–23)
Basophils Absolute: 0.1 10*3/uL (ref 0.0–0.1)
Basophils Relative: 1 % (ref 0–1)
Eosinophils Relative: 3 % (ref 0–5)
Glucose, Bld: 332 mg/dL — ABNORMAL HIGH (ref 70–99)
HCT: 34.3 % — ABNORMAL LOW (ref 36.0–46.0)
Lymphocytes Relative: 35 % (ref 12–46)
MCHC: 31.2 g/dL (ref 30.0–36.0)
Neutro Abs: 2.5 10*3/uL (ref 1.7–7.7)
Platelets: 257 10*3/uL (ref 150–400)
Potassium: 4.8 meq/L (ref 3.5–5.3)
RDW: 13 % (ref 11.5–15.5)
Total Bilirubin: 0.7 mg/dL (ref 0.3–1.2)

## 2009-12-29 ENCOUNTER — Ambulatory Visit: Payer: Self-pay | Admitting: Internal Medicine

## 2009-12-29 LAB — CONVERTED CEMR LAB
Blood Glucose, Fingerstick: 226
Blood Glucose, Home Monitor: 1 mg/dL
INR: 3.5

## 2010-01-19 ENCOUNTER — Ambulatory Visit: Payer: Self-pay | Admitting: Internal Medicine

## 2010-01-19 LAB — CONVERTED CEMR LAB: INR: 4.1

## 2010-02-23 ENCOUNTER — Ambulatory Visit: Payer: Self-pay | Admitting: Internal Medicine

## 2010-02-23 LAB — CONVERTED CEMR LAB

## 2010-03-16 ENCOUNTER — Ambulatory Visit: Payer: Self-pay | Admitting: Internal Medicine

## 2010-03-16 LAB — CONVERTED CEMR LAB: INR: 2

## 2010-04-13 ENCOUNTER — Ambulatory Visit: Payer: Self-pay | Admitting: Internal Medicine

## 2010-04-13 LAB — CONVERTED CEMR LAB: INR: 2.5

## 2010-04-16 ENCOUNTER — Ambulatory Visit: Payer: Self-pay | Admitting: Internal Medicine

## 2010-04-16 LAB — CONVERTED CEMR LAB
Basophils Absolute: 0.1 10*3/uL (ref 0.0–0.1)
Blood Glucose, Fingerstick: 168
CO2: 27 meq/L (ref 19–32)
Calcium: 9.4 mg/dL (ref 8.4–10.5)
Chloride: 102 meq/L (ref 96–112)
Creatinine, Ser: 1.99 mg/dL — ABNORMAL HIGH (ref 0.40–1.20)
Eosinophils Relative: 2 % (ref 0–5)
Glucose, Bld: 200 mg/dL — ABNORMAL HIGH (ref 70–99)
HCT: 33.3 % — ABNORMAL LOW (ref 36.0–46.0)
Hemoglobin: 10.6 g/dL — ABNORMAL LOW (ref 12.0–15.0)
Hgb A1c MFr Bld: 7.2 %
Lymphocytes Relative: 30 % (ref 12–46)
Lymphs Abs: 2.1 10*3/uL (ref 0.7–4.0)
Monocytes Absolute: 0.6 10*3/uL (ref 0.1–1.0)
Neutro Abs: 4.1 10*3/uL (ref 1.7–7.7)
RBC: 3.64 M/uL — ABNORMAL LOW (ref 3.87–5.11)
RDW: 12.6 % (ref 11.5–15.5)
WBC: 7 10*3/uL (ref 4.0–10.5)

## 2010-04-20 ENCOUNTER — Encounter: Payer: Self-pay | Admitting: Internal Medicine

## 2010-04-26 ENCOUNTER — Emergency Department (HOSPITAL_COMMUNITY): Admission: EM | Admit: 2010-04-26 | Discharge: 2010-04-26 | Payer: Self-pay | Admitting: Family Medicine

## 2010-05-25 ENCOUNTER — Ambulatory Visit: Payer: Self-pay | Admitting: Internal Medicine

## 2010-06-15 ENCOUNTER — Ambulatory Visit: Payer: Self-pay | Admitting: Internal Medicine

## 2010-06-15 LAB — CONVERTED CEMR LAB: INR: 2.4

## 2010-07-06 ENCOUNTER — Ambulatory Visit: Payer: Self-pay | Admitting: Internal Medicine

## 2010-07-06 LAB — CONVERTED CEMR LAB: INR: 2.6

## 2010-08-03 ENCOUNTER — Ambulatory Visit: Admission: RE | Admit: 2010-08-03 | Discharge: 2010-08-03 | Payer: Self-pay | Source: Home / Self Care

## 2010-08-03 LAB — CONVERTED CEMR LAB: INR: 2.1

## 2010-08-11 DIAGNOSIS — I639 Cerebral infarction, unspecified: Secondary | ICD-10-CM

## 2010-08-11 DIAGNOSIS — Z86718 Personal history of other venous thrombosis and embolism: Secondary | ICD-10-CM

## 2010-08-11 DIAGNOSIS — Z7901 Long term (current) use of anticoagulants: Secondary | ICD-10-CM | POA: Insufficient documentation

## 2010-08-11 HISTORY — DX: Personal history of other venous thrombosis and embolism: Z86.718

## 2010-08-25 NOTE — Progress Notes (Signed)
Summary: Refill/gh  Phone Note Refill Request   Refills Requested: Medication #1:  COLCHICINE 0.6 MG TABS One tab PO every 8 hours for 3 days Colchicine is no longer available.  Pt will need a prescription for something else.   Method Requested: Electronic Initial call taken by: Sander Nephew RN,  August 22, 2009 4:52 PM  Follow-up for Phone Call        Changed to colchrys brand name. Follow-up by: Rhea Pink  DO,  August 28, 2009 2:31 PM    New/Updated Medications: COLCRYS 0.6 MG TABS (COLCHICINE) Take 1 tablet by mouth two times a day as needed for gout flare Prescriptions: COLCRYS 0.6 MG TABS (COLCHICINE) Take 1 tablet by mouth two times a day as needed for gout flare  #30 x 0   Entered and Authorized by:   Rhea Pink  DO   Signed by:   Rhea Pink  DO on 08/28/2009   Method used:   Electronically to        [facility] [phone]* (retail)       [address]       [place], [place]  [address]       Ph: [phone]       Fax: [phone]   RxID:   [id number]

## 2010-08-25 NOTE — Assessment & Plan Note (Signed)
Summary: EST-1 MONTH CHECKUP/CH   Vital Signs:  Patient profile:   77 year old female Height:      64 inches (162.56 cm) Weight:      179.05 pounds (81.39 kg) BMI:     30.84 Temp:     97.1 degrees F (36.17 degrees C) oral Pulse rate:   73 / minute BP sitting:   164 / 69  (right arm)  Vitals Entered By: Sander Nephew RN (August 14, 2009 10:55 AM) Is Patient Diabetic? Yes Did you bring your meter with you today? Yes Pain Assessment Patient in pain? no      Nutritional Status BMI of > 30 = obese CBG Result 284  Have you ever been in a relationship where you felt threatened, hurt or afraid?No   Does patient need assistance? Functional Status Self care Ambulation Normal Comments Check up.  Did not have her Coreg all last week.  Wants meds set to go all at 1 time.   Primary Care Provider:  Rhea Pink  DO   History of Present Illness: Ms. Moroney come in for follow-up on her DM and Hypertension. She complains of wrist pain today. Otherwise she is doing well.  Depression History:      The patient denies a depressed mood most of the day and a diminished interest in her usual daily activities.         Preventive Screening-Counseling & Management  Alcohol-Tobacco     Smoking Status: never     Passive Smoke Exposure: no  Allergies: No Known Drug Allergies  Physical Exam  General:  Well-developed,well-nourished,in no acute distress; alert,appropriate and cooperative throughout examination Lungs:  Normal respiratory effort, chest expands symmetrically. Lungs are clear to auscultation, no crackles or wheezes. Heart:  Normal rate and regular rhythm. S1 and S2 normal without gallop, murmur, click, rub or other extra sounds. Abdomen:  Bowel sounds positive,abdomen soft and non-tender without masses, organomegaly or hernias noted. Msk:  tender bilateral wrists to palpation- crepitus, OA changes noted Extremities:  no edema   Impression & Recommendations:  Problem # 1:   DIABETES MELLITUS (ICD-250.00) Significantly worse HbA1C. She was intolerant to Metformin. She did not have her ACE-I in her medication bag(not taking it). She is confused about her medications today. I think she will likely need to to go on Insulin soon. I have straightened out her meds and sent scripts to the pharmacy for her complete med list so she can get them all picked up on the same schedule. I have encouraged her to meet with Barnabas Harries for DM education and MNT ASAP. Given her age and other comorbidities- I would at least like to keep her A1C between 7-8.  Her updated medication list for this problem includes:    Benazepril Hcl 40 Mg Tabs (Benazepril hcl) .Marland Kitchen... Take 1 tablet by mouth once daily    Glipizide 10 Mg Xr24h-tab (Glipizide) .Marland Kitchen... Take 1 tablet by mouth once a day    Januvia 100 Mg Tabs (Sitagliptin phosphate) .Marland Kitchen... Take 1/2(50mg ) tablet by mouth once daily unless directed otherwise.  Orders: Capillary Blood Glucose/CBG [phone])  Labs Reviewed: Creat: 1.58 (07/09/2009)     Last Eye Exam: Mild non-proliferative diabetic retinopathy.   Glaucoma- mild  (10/01/2008) Reviewed HgBA1c results: 8.5 (07/09/2009)  7.0 (01/09/2009)  Problem # 2:  HYPERTENSION, ESSENTIAL NOS (ICD-401.9) BP high today but she has not been taking her Benazepril. Will restart this and help her with mEd rec today. The following medications were removed from  the medication list:    Coreg 6.25 Mg Tabs (Carvedilol) .Marland Kitchen... Take 1 tablet by mouth two times a day Her updated medication list for this problem includes:    Furosemide 40 Mg Tabs (Furosemide) .Marland Kitchen... Take 1 tablet by mouth once a day    Benazepril Hcl 40 Mg Tabs (Benazepril hcl) .Marland Kitchen... Take 1 tablet by mouth once daily    Norvasc 10 Mg Tabs (Amlodipine besylate) .Marland Kitchen... Take 1 tablet by mouth once daily  BP today: 164/69 Prior BP: 158/78 (07/09/2009)  Labs Reviewed: K+: 5.2 (07/09/2009) Creat: : 1.58 (07/09/2009)   Chol: 131 (06/27/2008)   HDL:  41 (06/27/2008)   LDL: 71 (06/27/2008)   TG: 94 (06/27/2008)  Complete Medication List: 1)  Furosemide 40 Mg Tabs (Furosemide) .... Take 1 tablet by mouth once a day 2)  Benazepril Hcl 40 Mg Tabs (Benazepril hcl) .... Take 1 tablet by mouth once daily 3)  Glipizide 10 Mg Xr24h-tab (Glipizide) .... Take 1 tablet by mouth once a day 4)  Norvasc 10 Mg Tabs (Amlodipine besylate) .... Take 1 tablet by mouth once daily 5)  Simvastatin 40 Mg Tabs (Simvastatin) .... Take 1 tablet by mouth once daily 6)  Januvia 100 Mg Tabs (Sitagliptin phosphate) .... Take 1/2(50mg ) tablet by mouth once daily unless directed otherwise. 7)  Coumadin 7.5 Mg Tabs (Warfarin sodium) .... One tablet po daily or take as directed 8)  Colchicine 0.6 Mg Tabs (Colchicine) .... One tab po every 8 hours for 3 days 9)  Indomethacin 25 Mg Caps (Indomethacin) .... Take one tablet three times a day for 5 days 10)  Flintstones Plus Calcium Chew (Pediatric multiple vit-c-fa) .... Take as directed 11)  Chewable Calcium/d 300-100 Mg-unit Chew (Calcium-vitamin d) .... Take as directed 12)  Voltaren 1 % Gel (Diclofenac sodium) .... Apply to knees two times a day as directed  Patient Instructions: 1)  Please schedule a follow-up appointment in 1-2 months. Prescriptions: INDOMETHACIN 25 MG CAPS (INDOMETHACIN) Take one tablet three times a day for 5 days  #15 x 0   Entered and Authorized by:   Rhea Pink  DO   Signed by:   Rhea Pink  DO on 08/14/2009   Method used:   Print then Give to Patient   RxID:   [id number] COLCHICINE 0.6 MG TABS (COLCHICINE) One tab PO every 8 hours for 3 days  #12 x 0   Entered and Authorized by:   Rhea Pink  DO   Signed by:   Rhea Pink  DO on 08/14/2009   Method used:   Print then Give to Patient   [id number] COUMADIN 7.5 MG TABS (WARFARIN SODIUM) One tablet po daily or take as directed Brand medically necessary #60 Tablet x 11   Entered and Authorized by:   Rhea Pink  DO    Signed by:   Rhea Pink  DO on 08/14/2009   Method used:   Print then Give to Patient   RxID:   [id number] JANUVIA 100 MG TABS (SITAGLIPTIN PHOSPHATE) take 1/2(50mg ) tablet by mouth once daily unless directed otherwise.  #30 x 11   Entered and Authorized by:   Rhea Pink  DO   Signed by:   Rhea Pink  DO on 08/14/2009   Method used:   Print then Give to Patient   RxID:   [id number] SIMVASTATIN 40 MG TABS (SIMVASTATIN) take 1 tablet by mouth once daily  #30 x 11   Entered and Authorized by:  Rhea Pink  DO   Signed by:   Rhea Pink  DO on 08/14/2009   Method used:   Print then Give to Patient   RxID:   [id number] [place] 10 MG TABS (AMLODIPINE BESYLATE) take 1 tablet by mouth once daily  #30 x 11   Entered and Authorized by:   Rhea Pink  DO   Signed by:   Rhea Pink  DO on 08/14/2009   Method used:   Print then Give to Patient   RxID:   [id number] GLIPIZIDE 10 MG XR24H-TAB (GLIPIZIDE) Take 1 tablet by mouth once a day  #30 x 11   Entered and Authorized by:   Rhea Pink  DO   Signed by:   Rhea Pink  DO on 08/14/2009   Method used:   Print then Give to Patient   RxID:   [id number] BENAZEPRIL HCL 40 MG TABS (BENAZEPRIL HCL) take 1 tablet by mouth once daily  #30 x 11   Entered and Authorized by:   Rhea Pink  DO   Signed by:   Rhea Pink  DO on 08/14/2009   Method used:   Print then Give to Patient   RxID:   [id number] FUROSEMIDE 40 MG TABS (FUROSEMIDE) Take 1 tablet by mouth once a day  #30 x 11   Entered and Authorized by:   Rhea Pink  DO   Signed by:   Rhea Pink  DO on 08/14/2009   Method used:   Print then Give to Patient   RxID:   [id number] VOLTAREN 1 % GEL (DICLOFENAC SODIUM) Apply to knees two times a day as directed  #1 tube x 3   Entered and Authorized by:   Rhea Pink  DO   Signed by:   Rhea Pink  DO on 08/14/2009   Method used:   Print then Give to Patient   RxID:    [id number]   Prevention & Chronic Care Immunizations   Influenza vaccine: Not documented   Influenza vaccine deferral: Refused  (07/09/2009)   Influenza vaccine due: 03/26/2009    Tetanus booster: Not documented   Td booster deferral: Deferred  (01/09/2009)    Pneumococcal vaccine: Not documented   Pneumococcal vaccine deferral: Deferred  (01/09/2009)    H. zoster vaccine: Not documented   H. zoster vaccine deferral: Deferred  (01/09/2009)  Colorectal Screening   Hemoccult: Not documented   Hemoccult action/deferral: Deferred  (01/09/2009)    Colonoscopy: Not documented   Colonoscopy action/deferral: Deferred  (01/09/2009)  Other Screening   Pap smear: Not documented   Pap smear action/deferral: Not indicated-other  (01/09/2009)    Mammogram: Not documented    DXA bone density scan: Not documented   Smoking status: never  (08/14/2009)  Diabetes Mellitus   HgbA1C: 8.5  (07/09/2009)   HgbA1C action/deferral: Ordered  (01/09/2009)   Hemoglobin A1C due: 04/11/2009    Eye exam: Mild non-proliferative diabetic retinopathy.   Glaucoma- mild   (10/01/2008)   Eye exam due: 10/01/2009    Foot exam: yes  (06/27/2008)   Foot exam action/deferral: Deferred   High risk foot: Yes  (12/12/2006)   Foot care education: Done  (12/12/2006)   Foot exam due: 04/11/2009    Urine microalbumin/creatinine ratio: 196.0  (04/26/2007)   Urine microalbumin action/deferral: Not indicated  Lipids   Total Cholesterol: 131  (06/27/2008)   LDL: 71  (06/27/2008)   LDL Direct: Not documented   HDL: 41  (06/27/2008)   Triglycerides: 94  (06/27/2008)  SGOT (AST): 15  (07/09/2009)   SGPT (ALT): 10  (07/09/2009)   Alkaline phosphatase: 97  (07/09/2009)   Total bilirubin: 0.5  (07/09/2009)  Hypertension   Last Blood Pressure: 164 / 69  (08/14/2009)   Serum creatinine: 1.58  (07/09/2009)   BMP action: Deferred   Serum potassium 5.2  (A999333)   Basic metabolic panel due:  Q000111Q  Self-Management Support :   Personal Goals (by the next clinic visit) :     Personal A1C goal: 7  (07/09/2009)     Personal blood pressure goal: 130/80  (07/09/2009)     Personal LDL goal: 100  (07/09/2009)    Patient will work on the following items until the next clinic visit to reach self-care goals:     Medications and monitoring: take my medicines every day, check my blood sugar, bring all of my medications to every visit, examine my feet every day  (08/14/2009)     Eating: drink diet soda or water instead of juice or soda, eat foods that are low in salt, eat baked foods instead of fried foods  (08/14/2009)     Activity: take a 30 minute walk every day  (08/14/2009)    Diabetes self-management support: Copy of home glucose meter record, CBG self-monitoring log  (08/14/2009)   Last diabetes self-management training by diabetes educator: 07/29/2008   Last medical nutrition therapy: 09/02/2008    Hypertension self-management support: Education handout  (07/09/2009)    Lipid self-management support: Education handout  (07/09/2009)

## 2010-08-25 NOTE — Letter (Signed)
Summary: BLOOD SUGAR  BLOOD SUGAR   Imported By: Garlan Fillers 01/02/2010 08:25:45  _____________________________________________________________________  External Attachment:    Type:   Image     Comment:   External Document

## 2010-08-25 NOTE — Assessment & Plan Note (Signed)
Summary: COU/SB.  Anticoagulant Therapy Managed by: [name]. Ceasar Lund  PharmD CACP PCP: [facility] Attending: Philbert Riser MD, [place] Indication 1: TIA/CVA Indication 2: Aftercare long term use Anticoagulants V58.61, V58.83 Start date: 07/09/1992 Duration: Indefinite  Patient Assessment Reviewed by: [name] PharmD  May 25, 2010 Medication review: verified warfarin dosage & schedule,verified previous prescription medications, verified doses & any changes, verified new medications, reviewed OTC medications, reviewed OTC health products-vitamins supplements etc Complications: none Dietary changes: none   Health status changes: none   Lifestyle changes: none   Recent/future hospitalizations: none   Recent/future procedures: none   Recent/future dental: none Patient Assessment Part 2:  Have you MISSED ANY DOSES or CHANGED TABLETS?  No missed Warfarin doses or changed tablets.  Have you had any BRUISING or BLEEDING ( nose or gum bleeds,blood in urine or stool)?  No reported bruising or bleeding in nose, gums, urine, stool.  Have you STARTED or STOPPED any MEDICATIONS, including OTC meds,herbals or supplements?  No other medications or herbal supplements were started or stopped.  Have you CHANGED your DIET, especially green vegetables,or ALCOHOL intake?  No changes in diet or alcohol intake.  Have you had any ILLNESSES or HOSPITALIZATIONS?  No reported illnesses or hospitalizations  Have you had any signs of CLOTTING?(chest discomfort,dizziness,shortness of breath,arms tingling,slurred speech,swelling or redness in leg)    No chest discomfort, dizziness, shortness of breath, tingling in arm, slurred speech, swelling, or redness in leg.     Treatment  Target INR: 2.0-3.0 INR: 3.5  Date: 05/25/2010 Regimen In:  37.5mg /week INR reflects regimen in: 3.5  New  Tablet strength: : 7.5mg  Regimen Out:     Sunday: 1/2 Tablet     Monday: 1 Tablet     Tuesday: 1/2  Tablet     Wednesday: 1/2 Tablet     Thursday: 1 Tablet      Friday: 1/2 Tablet     Saturday: 1/2 Tablet Total Weekly: 33.75mg /week mg  Next INR Due: 06/15/2010 Adjusted by: [name]. Elie Confer III PharmD CACP   Return to anticoagulation clinic:  06/15/2010 Time of next visit: 1415  Hold:  1 Days     Allergies: No Known Drug Allergies

## 2010-08-25 NOTE — Assessment & Plan Note (Signed)
Summary: checkup/pcp-Chelsea Mcdaniel/hla   Vital Signs:  Patient profile:   77 year old female Height:      64 inches (162.56 cm) Weight:      178.03 pounds (80.92 kg) BMI:     30.67 Temp:     97.3 degrees F (36.28 degrees C) oral Pulse rate:   77 / minute BP sitting:   146 / 74  (right arm)  Vitals Entered By: Chelsea Nephew RN (Nov 24, 2009 11:35 AM) Is Patient Diabetic? Yes Did you bring your meter with you today? Yes Pain Assessment Patient in pain? no      Nutritional Status BMI of > 30 = obese CBG Result 293  Have you ever been in a relationship where you felt threatened, hurt or afraid?No   Does patient need assistance? Functional Status Self care Ambulation Normal Comments Check up.     Primary Care Provider:  Rhea Pink  DO   History of Present Illness: Chelsea Mcdaniel comes in today for routine check up and DM care. no complaints today. Her A1C was up at her last visit and likely related to stress and teh recent dath of her sister. she is here today to get back on track with her medicine and self management.   Depression History:      The patient denies a depressed mood most of the day and a diminished interest in her usual daily activities.        The patient denies that she feels like life is not worth living, denies that she wishes that she were dead, and denies that she has thought about ending her life.         Preventive Screening-Counseling & Management  Alcohol-Tobacco     Smoking Status: never     Passive Smoke Exposure: no  Current Medications (verified): 1)  Furosemide 40 Mg Tabs (Furosemide) .... Take 1 Tablet By Mouth Once A Day 2)  Benazepril Hcl 40 Mg Tabs (Benazepril Hcl) .... Take 1 Tablet By Mouth Once Daily 3)  Glipizide 10 Mg Xr24h-Tab (Glipizide) .... Take 1 Tablet By Mouth Once A Day 4)  Norvasc 10 Mg Tabs (Amlodipine Besylate) .... Take 1 Tablet By Mouth Once Daily 5)  Simvastatin 40 Mg Tabs (Simvastatin) .... Take 1 Tablet By Mouth Once  Daily 6)  Januvia 100 Mg Tabs (Sitagliptin Phosphate) .... Take 1/2(50mg ) Tablet By Mouth Once Daily Unless Directed Otherwise. 7)  Coumadin 7.5 Mg Tabs (Warfarin Sodium) .... One Tablet Po Daily or Take As Directed 8)  Colcrys 0.6 Mg Tabs (Colchicine) .... Take 1 Tablet By Mouth Two Times A Day As Needed For Gout Flare 9)  Indomethacin 25 Mg Caps (Indomethacin) .... Take One Tablet Three Times A Day For 5 Days 10)  Flintstones Plus Calcium  Chew (Pediatric Multiple Vit-C-Fa) .... Take As Directed 11)  Chewable Calcium/d 300-100 Mg-Unit Chew (Calcium-Vitamin D) .... Take As Directed 12)  Voltaren 1 % Gel (Diclofenac Sodium) .... Apply To Knees Two Times A Day As Directed  Allergies (verified): No Known Drug Allergies  Review of Systems      See HPI  Physical Exam  General:  Well-developed,well-nourished,in no acute distress; alert,appropriate and cooperative throughout examination Lungs:  Normal respiratory effort, chest expands symmetrically. Lungs are clear to auscultation, no crackles or wheezes. Heart:  Normal rate and regular rhythm. S1 and S2 normal without gallop, murmur, click, rub or other extra sounds. Abdomen:  Bowel sounds positive,abdomen soft and non-tender without masses, organomegaly or hernias noted.  Msk:  tender bilateral wrists to palpation- crepitus, OA changes noted Extremities:  no edema Skin:  no rashes.     Impression & Recommendations:  Problem # 1:  HYPERTENSION, ESSENTIAL NOS (ICD-401.9) Bp still more elevated than usual. If her next reading is high I will start her on additional medication. Her updated medication list for this problem includes:    Furosemide 40 Mg Tabs (Furosemide) .Marland Kitchen... Take 1 tablet by mouth once a day    Benazepril Hcl 40 Mg Tabs (Benazepril hcl) .Marland Kitchen... Take 1 tablet by mouth once daily    Norvasc 10 Mg Tabs (Amlodipine besylate) .Marland Kitchen... Take 1 tablet by mouth once daily  BP today: 146/74 Prior BP: 164/69 (08/14/2009)  Labs  Reviewed: K+: 5.2 (07/09/2009) Creat: : 1.58 (07/09/2009)   Chol: 131 (06/27/2008)   HDL: 41 (06/27/2008)   LDL: 71 (06/27/2008)   TG: 94 (06/27/2008)  Problem # 2:  DIABETES MELLITUS (ICD-250.00) A1C is improved since December. Now 7.7. Still trying to avoid insulin per patient request although I think we are likely heading in that direction sooner rather than later. She is Metform intolerant.  Her updated medication list for this problem includes:    Benazepril Hcl 40 Mg Tabs (Benazepril hcl) .Marland Kitchen... Take 1 tablet by mouth once daily    Glipizide 10 Mg Xr24h-tab (Glipizide) .Marland Kitchen... Take 1 tablet by mouth once a day    Januvia 100 Mg Tabs (Sitagliptin phosphate) .Marland Kitchen... Take 1/2(50mg ) tablet by mouth once daily unless directed otherwise.  Orders: T- Capillary Blood Glucose [id number]) T-Hgb A1C (in-house) [id number]) T-Comprehensive Metabolic Panel ([id number]) T-CBC w/Diff [id number])  Labs Reviewed: Creat: 1.58 (07/09/2009)     Last Eye Exam: Mild non-proliferative diabetic retinopathy.   Glaucoma- mild  (10/01/2008) Reviewed HgBA1c results: 7.7 (11/24/2009)  8.5 (07/09/2009)  Problem # 3:  EMBOLISM/THROMBOSIS, DEEP VSL LWR EXTRM NOS (ICD-453.40) On chronic coumadin. No hx of A-fib. Hx of CVA. She has been on coumadin for years. I am not sure what the details were behind starting it - she tells me it is was for her stroke. ideally would be better for her to stop the coumadin, in teh absence of A-Fib, but I cannot be sure she doesnt have a hypercoagulable state with a history of CVA and DVT. Likely those records would be hard to locate. Will continue for now.  Problem # 4:  HYPERLIPIDEMIA ([id number]) At goal. No change. Her updated medication list for this problem includes:    Simvastatin 40 Mg Tabs (Simvastatin) .Marland Kitchen... Take 1 tablet by mouth once daily  Labs Reviewed: SGOT: 15 (07/09/2009)   SGPT: 10 (07/09/2009)   HDL:41 (06/27/2008), 47 (04/26/2007)  LDL:71 (06/27/2008), 111  (04/26/2007)  Chol:131 (06/27/2008), 189 (04/26/2007)  Trig:94 (06/27/2008), 156 (04/26/2007)  Complete Medication List: 1)  Furosemide 40 Mg Tabs (Furosemide) .... Take 1 tablet by mouth once a day 2)  Benazepril Hcl 40 Mg Tabs (Benazepril hcl) .... Take 1 tablet by mouth once daily 3)  Glipizide 10 Mg Xr24h-tab (Glipizide) .... Take 1 tablet by mouth once a day 4)  Norvasc 10 Mg Tabs (Amlodipine besylate) .... Take 1 tablet by mouth once daily 5)  Simvastatin 40 Mg Tabs (Simvastatin) .... Take 1 tablet by mouth once daily 6)  Januvia 100 Mg Tabs (Sitagliptin phosphate) .... Take 1/2(50mg ) tablet by mouth once daily unless directed otherwise. 7)  Coumadin 7.5 Mg Tabs (Warfarin sodium) .... One tablet po daily or take as directed 8)  Colcrys 0.6 Mg Tabs (Colchicine) .... Take 1  tablet by mouth two times a day as needed for gout flare 9)  Indomethacin 25 Mg Caps (Indomethacin) .... Take one tablet three times a day for 5 days 10)  Flintstones Plus Calcium Chew (Pediatric multiple vit-c-fa) .... Take as directed 11)  Chewable Calcium/d 300-100 Mg-unit Chew (Calcium-vitamin d) .... Take as directed 12)  Voltaren 1 % Gel (Diclofenac sodium) .... Apply to knees two times a day as directed  Patient Instructions: 1)  Please schedule a follow-up appointment in 3 months.  Prevention & Chronic Care Immunizations   Influenza vaccine: Not documented   Influenza vaccine deferral: Refused  (07/09/2009)   Influenza vaccine due: 03/26/2009    Tetanus booster: Not documented   Td booster deferral: Deferred  (01/09/2009)    Pneumococcal vaccine: Not documented   Pneumococcal vaccine deferral: Deferred  (01/09/2009)    H. zoster vaccine: Not documented   H. zoster vaccine deferral: Deferred  (01/09/2009)  Colorectal Screening   Hemoccult: Not documented   Hemoccult action/deferral: Deferred  (01/09/2009)    Colonoscopy: Not documented   Colonoscopy action/deferral: Deferred  (01/09/2009)  Other  Screening   Pap smear: Not documented   Pap smear action/deferral: Not indicated-other  (01/09/2009)    Mammogram: Not documented    DXA bone density scan: Not documented   Smoking status: never  (11/24/2009)  Diabetes Mellitus   HgbA1C: 7.7  (11/24/2009)   HgbA1C action/deferral: Ordered  (01/09/2009)   Hemoglobin A1C due: 04/11/2009    Eye exam: Mild non-proliferative diabetic retinopathy.   Glaucoma- mild   (10/01/2008)   Eye exam due: 10/01/2009    Foot exam: yes  (06/27/2008)   Foot exam action/deferral: Deferred   High risk foot: Yes  (12/12/2006)   Foot care education: Done  (12/12/2006)   Foot exam due: 04/11/2009    Urine microalbumin/creatinine ratio: 196.0  (04/26/2007)   Urine microalbumin action/deferral: Not indicated  Lipids   Total Cholesterol: 131  (06/27/2008)   LDL: 71  (06/27/2008)   LDL Direct: Not documented   HDL: 41  (06/27/2008)   Triglycerides: 94  (06/27/2008)    SGOT (AST): 15  (07/09/2009)   SGPT (ALT): 10  (07/09/2009) CMP ordered    Alkaline phosphatase: 97  (07/09/2009)   Total bilirubin: 0.5  (07/09/2009)  Hypertension   Last Blood Pressure: 146 / 74  (11/24/2009)   Serum creatinine: 1.58  (07/09/2009)   BMP action: Deferred   Serum potassium 5.2  (07/09/2009) CMP ordered    Basic metabolic panel due: Q000111Q  Self-Management Support :   Personal Goals (by the next clinic visit) :     Personal A1C goal: 7  (07/09/2009)     Personal blood pressure goal: 130/80  (07/09/2009)     Personal LDL goal: 100  (07/09/2009)    Patient will work on the following items until the next clinic visit to reach self-care goals:     Medications and monitoring: take my medicines every day, check my blood sugar, examine my feet every day  (11/24/2009)     Eating: drink diet soda or water instead of juice or soda, eat more vegetables, use fresh or frozen vegetables, eat foods that are low in salt, eat baked foods instead of fried foods, eat  fruit for snacks and desserts, limit or avoid alcohol  (11/24/2009)     Activity: take a 30 minute walk every day  (11/24/2009)    Diabetes self-management support: Written self-care plan, Education handout, Pre-printed educational material, Resources for patients handout  (  11/24/2009)   Diabetes care plan printed   Diabetes education handout printed   Last diabetes self-management training by diabetes educator: 07/29/2008   Last medical nutrition therapy: 09/02/2008    Hypertension self-management support: Written self-care plan, Education handout, Pre-printed educational material, Resources for patients handout  (11/24/2009)   Hypertension self-care plan printed.   Hypertension education handout printed    Lipid self-management support: Written self-care plan, Education handout, Pre-printed educational material, Resources for patients handout  (11/24/2009)   Lipid self-care plan printed.   Lipid education handout printed      Resource handout printed.  Process Orders Check Orders Results:     [facility]: Check successful Tests Sent for requisitioning (Dec 01, 2009 10:47 PM):     11/24/2009: [facility] -- T-Comprehensive Metabolic Panel [id number] (signed)     11/24/2009: [facility] -- T-CBC w/Diff [id number] (signed)     Vital Signs:  Patient profile:   77 year old female Height:      64 inches (162.56 cm) Weight:      178.03 pounds (80.92 kg) BMI:     30.67 Temp:     97.3 degrees F (36.28 degrees C) oral Pulse rate:   77 / minute BP sitting:   146 / 74  (right arm)  Vitals Entered By: Chelsea Nephew RN (Nov 24, 2009 11:35 AM)      Laboratory Results   Blood Tests   Date/Time Received: Nov 24, 2009 12:11 PM  Date/Time Reported: Maryan Rued  Nov 24, 2009 12:11 PM   HGBA1C: 7.7%   (Normal Range: Non-Diabetic - 3-6%   Control Diabetic - 6-8%) CBG Random:: 293mg /dL  INR: 2.7   (Normal Range: 0.88-1.12    Therap INR: 2.0-3.5)

## 2010-08-25 NOTE — Assessment & Plan Note (Signed)
Summary: 261/ds261/ds  Anticoagulant Therapy Managed by: [name]. Ceasar Lund  PharmD CACP PCP: [place] Attending: Nance Pew MD, Laurene Footman Indication 1: TIA/CVA Indication 2: Aftercare long term use Anticoagulants V58.61, V58.83 Start date: 07/09/1992 Duration: Indefinite  Patient Assessment Reviewed by: [name] PharmD  April 13, 2010 Medication review: verified warfarin dosage & schedule,verified previous prescription medications, verified doses & any changes, verified new medications, reviewed OTC medications, reviewed OTC health products-vitamins supplements etc Complications: none Dietary changes: none   Health status changes: none   Lifestyle changes: none   Recent/future hospitalizations: none   Recent/future procedures: none   Recent/future dental: none Patient Assessment Part 2:  Have you MISSED ANY DOSES or CHANGED TABLETS?  No missed Warfarin doses or changed tablets.  Have you had any BRUISING or BLEEDING ( nose or gum bleeds,blood in urine or stool)?  No reported bruising or bleeding in nose, gums, urine, stool.  Have you STARTED or STOPPED any MEDICATIONS, including OTC meds,herbals or supplements?  No other medications or herbal supplements were started or stopped.  Have you CHANGED your DIET, especially green vegetables,or ALCOHOL intake?  No changes in diet or alcohol intake.  Have you had any ILLNESSES or HOSPITALIZATIONS?  No reported illnesses or hospitalizations  Have you had any signs of CLOTTING?(chest discomfort,dizziness,shortness of breath,arms tingling,slurred speech,swelling or redness in leg)    No chest discomfort, dizziness, shortness of breath, tingling in arm, slurred speech, swelling, or redness in leg.     Treatment  Target INR: 2.0-3.0 INR: 2.5  Date: 04/13/2010 Regimen In:  37.5mg /week INR reflects regimen in: 2.5  New  Tablet strength: : 7.5mg  Regimen Out:     Sunday: 1/2 Tablet     Monday: 1 Tablet     Tuesday:  1/2 Tablet     Wednesday: 1 Tablet     Thursday: 1/2 Tablet      Friday: 1 Tablet     Saturday: 1/2 Tablet Total Weekly: 37.5mg /week mg  Next INR Due: 05/11/2010 Adjusted by: [name]. Elie Confer III PharmD CACP   Return to anticoagulation clinic:  05/11/2010 Time of next visit: 115    Allergies: No Known Drug Allergies

## 2010-08-25 NOTE — Assessment & Plan Note (Signed)
Summary: COU/CH  Anticoagulant Therapy Managed by: [name]. Ceasar Lund  PharmD CACP PCP: [place] Attending: Nance Pew MD, Laurene Footman Indication 1: TIA/CVA Indication 2: Aftercare long term use Anticoagulants V58.61, V58.83 Start date: 07/09/1992 Duration: Indefinite  Patient Assessment Reviewed by: [name] PharmD  December 29, 2009 Medication review: verified warfarin dosage & schedule,verified previous prescription medications, verified doses & any changes, verified new medications, reviewed OTC medications, reviewed OTC health products-vitamins supplements etc Complications: none Dietary changes: none   Health status changes: none   Lifestyle changes: none   Recent/future hospitalizations: none   Recent/future procedures: none   Recent/future dental: none Patient Assessment Part 2:  Have you MISSED ANY DOSES or CHANGED TABLETS?  No missed Warfarin doses or changed tablets.  Have you had any BRUISING or BLEEDING ( nose or gum bleeds,blood in urine or stool)?  No reported bruising or bleeding in nose, gums, urine, stool.  Have you STARTED or STOPPED any MEDICATIONS, including OTC meds,herbals or supplements?  No other medications or herbal supplements were started or stopped.  Have you CHANGED your DIET, especially green vegetables,or ALCOHOL intake?  No changes in diet or alcohol intake.  Have you had any ILLNESSES or HOSPITALIZATIONS?  No reported illnesses or hospitalizations  Have you had any signs of CLOTTING?(chest discomfort,dizziness,shortness of breath,arms tingling,slurred speech,swelling or redness in leg)    No chest discomfort, dizziness, shortness of breath, tingling in arm, slurred speech, swelling, or redness in leg.     Treatment  Target INR: 2.0-3.0 INR: 3.5  Date: 12/29/2009 Regimen In:  45.0mg /week INR reflects regimen in: 3.5  New  Tablet strength: : 7.5mg  Regimen Out:     Sunday: 1 Tablet     Monday: 1/2 Tablet     Tuesday: 1 Tablet     Wednesday: 1/2 Tablet     Thursday: 1 Tablet      Friday: 1/2 Tablet     Saturday: 1 Tablet Total Weekly: 41.25mg /week mg  Next INR Due: 01/19/2010 Adjusted by: [name]. Elie Confer III PharmD CACP   Return to anticoagulation clinic:  01/19/2010 Time of next visit: 0945    Allergies: No Known Drug Allergies

## 2010-08-25 NOTE — Assessment & Plan Note (Signed)
Summary: COU/CH  Anticoagulant Therapy Managed by: [name]. Ceasar Lund  PharmD CACP PCP: [place] Attending: Eppie Gibson MD, [place] Indication 1: TIA/CVA Indication 2: Aftercare long term use Anticoagulants V58.61, V58.83 Start date: 07/09/1992 Duration: Indefinite  Patient Assessment Reviewed by: [name] PharmD  August 25, 2009 Medication review: verified warfarin dosage & schedule,verified previous prescription medications, verified doses & any changes, verified new medications, reviewed OTC medications, reviewed OTC health products-vitamins supplements etc Complications: none Dietary changes: none   Health status changes: none   Lifestyle changes: none   Recent/future hospitalizations: none   Recent/future procedures: none   Recent/future dental: none Patient Assessment Part 2:  Have you MISSED ANY DOSES or CHANGED TABLETS?  No missed Warfarin doses or changed tablets.  Have you had any BRUISING or BLEEDING ( nose or gum bleeds,blood in urine or stool)?  No reported bruising or bleeding in nose, gums, urine, stool.  Have you STARTED or STOPPED any MEDICATIONS, including OTC meds,herbals or supplements?  No other medications or herbal supplements were started or stopped.  Have you CHANGED your DIET, especially green vegetables,or ALCOHOL intake?  No changes in diet or alcohol intake.  Have you had any ILLNESSES or HOSPITALIZATIONS?  No reported illnesses or hospitalizations  Have you had any signs of CLOTTING?(chest discomfort,dizziness,shortness of breath,arms tingling,slurred speech,swelling or redness in leg)    No chest discomfort, dizziness, shortness of breath, tingling in arm, slurred speech, swelling, or redness in leg.     Treatment  Target INR: 2.0-3.0 INR: 2.2  Date: 08/25/2009 Regimen In:  41.25mg /week INR reflects regimen in: 2.2  New  Tablet strength: : 7.5mg  Regimen Out:     Sunday: 1 Tablet     Monday: 1/2 Tablet     Tuesday: 1  Tablet     Wednesday: 1 Tablet     Thursday: 1/2 Tablet      Friday: 1 Tablet     Saturday: 1 Tablet Total Weekly: 45.0mg /week mg  Next INR Due: 09/22/2009 Adjusted by: [name]. Elie Confer III PharmD CACP   Return to anticoagulation clinic:  09/22/2009 Time of next visit: 1100    Allergies: No Known Drug Allergies

## 2010-08-25 NOTE — Assessment & Plan Note (Signed)
Summary: FU VISIT/DS   Vital Signs:  Patient profile:   77 year old female Height:      64 inches (162.56 cm) Weight:      174 pounds (79.09 kg) BMI:     29.97 Temp:     97.3 degrees F (36.28 degrees C) oral Pulse rate:   77 / minute BP sitting:   183 / 73  (right arm)  Vitals Entered By: Sander Nephew RN (April 16, 2010 11:10 AM) Is Patient Diabetic? Yes Did you bring your meter with you today? Yes Pain Assessment Patient in pain? no      Nutritional Status BMI of 25 - 29 = overweight CBG Result 168  Have you ever been in a relationship where you felt threatened, hurt or afraid?No   Does patient need assistance? Functional Status Self care Ambulation Normal Comments Check up.   Diabetic Foot Exam  Set Next Diabetic Foot Exam here: 06/27/2009   Primary Care Provider:  Rhea Pink  DO   History of Present Illness: Chelsea Mcdaniel comes in today for a routine blood pressure check. She reports doing well with her medications. She takes her BP at home and reports that SBPs are between 120-140. She has not been on indomethacin recently and in the past this has raised her BP. She continues to have OA pain in her wrists but is able to take tylenol for general joint pain.  Depression History:      The patient denies a depressed mood most of the day and a diminished interest in her usual daily activities.         Preventive Screening-Counseling & Management  Alcohol-Tobacco     Smoking Status: never     Passive Smoke Exposure: no  Current Medications (verified): 1)  Furosemide 40 Mg Tabs (Furosemide) .... Take 1 Tablet By Mouth Once A Day 2)  Benazepril Hcl 40 Mg Tabs (Benazepril Hcl) .... Take 1 Tablet By Mouth Once Daily 3)  Glipizide 10 Mg Xr24h-Tab (Glipizide) .... Take 1 Tablet By Mouth Once A Day 4)  Norvasc 10 Mg Tabs (Amlodipine Besylate) .... Take 1 Tablet By Mouth Once Daily 5)  Simvastatin 40 Mg Tabs (Simvastatin) .... Take 1 Tablet By Mouth Once Daily 6)   Januvia 100 Mg Tabs (Sitagliptin Phosphate) .... Take 1/2(50mg ) Tablet By Mouth Once Daily Unless Directed Otherwise. 7)  Coumadin 7.5 Mg Tabs (Warfarin Sodium) .... One Tablet Po Daily or Take As Directed 8)  Colcrys 0.6 Mg Tabs (Colchicine) .... Take 1 Tablet By Mouth Two Times A Day As Needed For Gout Flare 9)  Indomethacin 25 Mg Caps (Indomethacin) .... Take One Tablet Three Times A Day For 5 Days 10)  Flintstones Plus Calcium  Chew (Pediatric Multiple Vit-C-Fa) .... Take As Directed 11)  Chewable Calcium/d 300-100 Mg-Unit Chew (Calcium-Vitamin D) .... Take As Directed 12)  Voltaren 1 % Gel (Diclofenac Sodium) .... Apply To Knees Two Times A Day As Directed  Allergies (verified): No Known Drug Allergies  Family History: Family History of CAD Female 1st degree relative <60 Family History Diabetes 1st degree relative Family History High cholesterol Family History Hypertension  Father died at the age of 37 from a ruptured cerebral blood vessel Mother died due to complications of Diabetes Mellitus  Social History: She is a widow.  She has 11 children, 5 of whom are living. She retired in 1993 from [employer].  She denies tobacco, alcohol, or drug use.  Physical Exam  General:  Well-developed,well-nourished,in no  acute distress; alert,appropriate and cooperative throughout examination Lungs:  Normal respiratory effort, chest expands symmetrically. Lungs are clear to auscultation, no crackles or wheezes. Heart:  Normal rate and regular rhythm. S1 and S2 normal without gallop, murmur, click, rub or other extra sounds. Abdomen:  Bowel sounds positive,abdomen soft and non-tender without masses, organomegaly or hernias noted. Msk:  tender bilateral wrists to palpation- crepitus, OA changes noted Skin:  no rashes.     Impression & Recommendations:  Problem # 1:  ANEMIA NEC (ICD-285.8) She has a chronic ron deficiency anemia since 2001. She has refused colonoscopy screening. I will  send stool cards and if positive will strongly encourage her to get a colonoscopy.  Hgb: 10.7 (11/24/2009)   Hct: 34.3 (11/24/2009)   Platelets: 257 (11/24/2009) RBC: 3.70 (11/24/2009)   RDW: 13.0 (11/24/2009)   WBC: 5.0 (11/24/2009) MCV: 92.7 (11/24/2009)   MCHC: 31.2 (11/24/2009) B12: 530 (07/09/2009)   TSH: 2.476 (04/26/2007)  Problem # 2:  HYPERTENSION, ESSENTIAL NOS (ICD-401.9)  Reviewed home readings- systolic between [id number] mostly. No changes to BP regimen today. Her updated medication list for this problem includes:    Furosemide 40 Mg Tabs (Furosemide) .Marland Kitchen... Take 1 tablet by mouth once a day    Benazepril Hcl 40 Mg Tabs (Benazepril hcl) .Marland Kitchen... Take 1 tablet by mouth once daily    Norvasc 10 Mg Tabs (Amlodipine besylate) .Marland Kitchen... Take 1 tablet by mouth once daily  Orders: T-Basic Metabolic Panel ([id number]) T-CBC w/Diff [phone])  BP today: 183/73 Prior BP: 146/74 (11/24/2009)  Labs Reviewed: K+: 4.8 (11/24/2009) Creat: : 1.66 (11/24/2009)   Chol: 131 (06/27/2008)   HDL: 41 (06/27/2008)   LDL: 71 (06/27/2008)   TG: 94 (06/27/2008)  Problem # 3:  DIABETES MELLITUS (ICD-250.00) She is due for her yearly eye exam- she tells me she will schedule an appointment. I am happy with her A1C being 7.2 and stable on oral meds. Will followthis in 3 months. On coumadin so no ASA. On an ACE. Following renal function. Needs lots of encouragement with medications.  Her updated medication list for this problem includes:    Benazepril Hcl 40 Mg Tabs (Benazepril hcl) .Marland Kitchen... Take 1 tablet by mouth once daily    Glipizide 10 Mg Xr24h-tab (Glipizide) .Marland Kitchen... Take 1 tablet by mouth once a day    Januvia 100 Mg Tabs (Sitagliptin phosphate) .Marland Kitchen... Take 1/2(50mg ) tablet by mouth once daily unless directed otherwise.  Labs Reviewed: Creat: 1.66 (11/24/2009)     Last Eye Exam: Mild non-proliferative diabetic retinopathy.   Glaucoma- mild  (10/01/2008) Reviewed HgBA1c results: 7.2 (04/16/2010)   7.7 (11/24/2009)  Complete Medication List: 1)  Furosemide 40 Mg Tabs (Furosemide) .... Take 1 tablet by mouth once a day 2)  Benazepril Hcl 40 Mg Tabs (Benazepril hcl) .... Take 1 tablet by mouth once daily 3)  Glipizide 10 Mg Xr24h-tab (Glipizide) .... Take 1 tablet by mouth once a day 4)  Norvasc 10 Mg Tabs (Amlodipine besylate) .... Take 1 tablet by mouth once daily 5)  Simvastatin 40 Mg Tabs (Simvastatin) .... Take 1 tablet by mouth once daily 6)  Januvia 100 Mg Tabs (Sitagliptin phosphate) .... Take 1/2(50mg ) tablet by mouth once daily unless directed otherwise. 7)  Coumadin 7.5 Mg Tabs (Warfarin sodium) .... One tablet po daily or take as directed 8)  Colcrys 0.6 Mg Tabs (Colchicine) .... Take 1 tablet by mouth two times a day as needed for gout flare 9)  Indomethacin 25 Mg Caps (Indomethacin) .... Take one tablet  three times a day for 5 days 10)  Flintstones Plus Calcium Chew (Pediatric multiple vit-c-fa) .... Take as directed 11)  Chewable Calcium/d 300-100 Mg-unit Chew (Calcium-vitamin d) .... Take as directed 12)  Voltaren 1 % Gel (Diclofenac sodium) .... Apply to knees two times a day as directed  Other Orders: Mammogram (Screening) (Mammo) Future Orders: T-Lipid Profile [id number]) ... 04/23/2010  Patient Instructions: 1)  Please schedule a follow-up appointment in 3-4 months. 2)  See Butch Penny in 3 months for 30 minute diabetes education visit.  Laboratory Results   Blood Tests   Date/Time Received: April 16, 2010 10:57 AM Date/Time Reported: Lenoria Farrier  April 16, 2010 10:57 AM   HGBA1C: 7.2%   (Normal Range: Non-Diabetic - 3-6%   Control Diabetic - 6-8%) CBG Random:: 168mg /dL       Vital Signs:  Patient profile:   77 year old female Height:      64 inches (162.56 cm) Weight:      174 pounds (79.09 kg) BMI:     29.97 Temp:     97.3 degrees F (36.28 degrees C) oral Pulse rate:   77 / minute BP sitting:   183 / 73  (right arm)  Vitals Entered  By: Sander Nephew RN (April 16, 2010 11:10 AM)  Prevention & Chronic Care Immunizations   Influenza vaccine: Not documented   Influenza vaccine deferral: Refused  (07/09/2009)   Influenza vaccine due: 03/26/2009    Tetanus booster: Not documented   Td booster deferral: Deferred  (01/09/2009)    Pneumococcal vaccine: Not documented   Pneumococcal vaccine deferral: Deferred  (01/09/2009)    H. zoster vaccine: Not documented   H. zoster vaccine deferral: Deferred  (01/09/2009)  Colorectal Screening   Hemoccult: Not documented   Hemoccult action/deferral: Deferred  (01/09/2009)    Colonoscopy: Not documented   Colonoscopy action/deferral: Deferred  (01/09/2009)  Other Screening   Pap smear: Not documented   Pap smear action/deferral: Not indicated-other  (01/09/2009)    Mammogram: Not documented   Mammogram action/deferral: Ordered  (04/16/2010)    DXA bone density scan: Not documented   DXA bone density action/deferral: Deferred  (04/16/2010)   Smoking status: never  (04/16/2010)  Diabetes Mellitus   HgbA1C: 7.2  (04/16/2010)   HgbA1C action/deferral: Ordered  (01/09/2009)   Hemoglobin A1C due: 04/11/2009    Eye exam: Mild non-proliferative diabetic retinopathy.   Glaucoma- mild   (10/01/2008)   Diabetic eye exam action/deferral: Ophthalmology referral  (04/16/2010)   Eye exam due: 10/01/2009    Foot exam: yes  (06/27/2008)   Foot exam action/deferral: Do today   High risk foot: Yes  (12/12/2006)   Foot care education: Done  (12/12/2006)   Foot exam due: 06/27/2009    Urine microalbumin/creatinine ratio: 196.0  (04/26/2007)   Urine microalbumin action/deferral: Not indicated    Diabetes flowsheet reviewed?: Yes   Progress toward A1C goal: Improved    Stage of readiness to change (diabetes management): Action  Lipids   Total Cholesterol: 131  (06/27/2008)   Lipid panel action/deferral: Lipid Panel ordered   LDL: 71  (06/27/2008)   LDL Direct: Not  documented   HDL: 41  (06/27/2008)   Triglycerides: 94  (06/27/2008)    SGOT (AST): 16  (11/24/2009)   SGPT (ALT): 10  (11/24/2009)   Alkaline phosphatase: 91  (11/24/2009)   Total bilirubin: 0.7  (11/24/2009)    Lipid flowsheet reviewed?: Yes   Progress toward LDL goal: At goal  Stage of readiness to change (lipid management): Action   Lipid comments: Patient will return for fasting labs  Hypertension   Last Blood Pressure: 183 / 73  (04/16/2010)   Serum creatinine: 1.66  (11/24/2009)   BMP action: Deferred   Serum potassium 4.8  ([id number])   Basic metabolic panel due: [id number]    Hypertension flowsheet reviewed?: Yes   Progress toward BP goal: Improved    Stage of readiness to change (hypertension management): Action  Self-Management Support :   Personal Goals (by the next clinic visit) :     Personal A1C goal: 7  (07/09/2009)     Personal blood pressure goal: 130/80  (07/09/2009)     Personal LDL goal: 100  (07/09/2009)    Patient will work on the following items until the next clinic visit to reach self-care goals:     Medications and monitoring: take my medicines every day, check my blood sugar, bring all of my medications to every visit, examine my feet every day  (04/16/2010)     Eating: drink diet soda or water instead of juice or soda, eat more vegetables, use fresh or frozen vegetables, eat foods that are low in salt, eat baked foods instead of fried foods, eat fruit for snacks and desserts, limit or avoid alcohol  (04/16/2010)     Activity: take a 30 minute walk every day  (04/16/2010)    Diabetes self-management support: Copy of home glucose meter record, CBG self-monitoring log, Written self-care plan, Resources for patients handout  (04/16/2010)   Diabetes care plan printed   Last diabetes self-management training by diabetes educator: 12/29/2009   Last medical nutrition therapy: 09/02/2008    Hypertension self-management support: Written self-care plan,  Education handout, Pre-printed educational material, Resources for patients handout  (04/16/2010)   Hypertension self-care plan printed.   Hypertension education handout printed    Lipid self-management support: Written self-care plan, Education handout, Pre-printed educational material, Resources for patients handout  (04/16/2010)   Lipid self-care plan printed.   Lipid education handout printed      Resource handout printed.   Nursing Instructions: Diabetic foot exam today Refer for screening diabetic eye exam (see order) Schedule screening mammogram (see order)    Process Orders Check Orders Results:     [facility]: Check successful Tests Sent for requisitioning (June 15, 2010 10:50 PM):     04/16/2010: [facility] -- T-Basic Metabolic Panel [id number] (signed)     04/16/2010: [facility] -- T-CBC w/Diff [id number] (signed)     04/23/2010: [facility] -- T-Lipid Profile [phone] (signed)

## 2010-08-25 NOTE — Assessment & Plan Note (Signed)
Summary: 945AM COU APPT/CH  Anticoagulant Therapy Managed by: [name]. Ceasar Lund  PharmD CACP PCP: [place] Attending: Nance Pew MD, Laurene Footman Indication 1: TIA/CVA Indication 2: Aftercare long term use Anticoagulants V58.61, V58.83 Start date: 07/09/1992 Duration: Indefinite  Patient Assessment Reviewed by: [name] PharmD  January 19, 2010 Medication review: verified warfarin dosage & schedule,verified previous prescription medications, verified doses & any changes, verified new medications, reviewed OTC medications, reviewed OTC health products-vitamins supplements etc Complications: none Dietary changes: none   Health status changes: none   Lifestyle changes: none   Recent/future hospitalizations: none   Recent/future procedures: none   Recent/future dental: none Patient Assessment Part 2:  Have you MISSED ANY DOSES or CHANGED TABLETS?  No missed Warfarin doses or changed tablets.  Have you had any BRUISING or BLEEDING ( nose or gum bleeds,blood in urine or stool)?  No reported bruising or bleeding in nose, gums, urine, stool.  Have you STARTED or STOPPED any MEDICATIONS, including OTC meds,herbals or supplements?  No other medications or herbal supplements were started or stopped.  Have you CHANGED your DIET, especially green vegetables,or ALCOHOL intake?  No changes in diet or alcohol intake.  Have you had any ILLNESSES or HOSPITALIZATIONS?  No reported illnesses or hospitalizations  Have you had any signs of CLOTTING?(chest discomfort,dizziness,shortness of breath,arms tingling,slurred speech,swelling or redness in leg)    No chest discomfort, dizziness, shortness of breath, tingling in arm, slurred speech, swelling, or redness in leg.     Treatment  Target INR: 2.0-3.0 INR: 4.1  Date: 01/19/2010 Regimen In:  41.25mg /week INR reflects regimen in: 4.1  New  Tablet strength: : 7.5mg  Regimen Out:     Sunday: 1/2 Tablet     Monday: 1 Tablet  Tuesday: 1/2 Tablet     Wednesday: 1 Tablet     Thursday: 1/2 Tablet      Friday: 1 Tablet     Saturday: 1/2 Tablet Total Weekly: 37.5mg /week mg  Next INR Due: 02/23/2010 Adjusted by: [name]. Elie Confer III PharmD CACP   Return to anticoagulation clinic:  02/23/2010 Time of next visit: 1015    Allergies: No Known Drug Allergies

## 2010-08-25 NOTE — Assessment & Plan Note (Signed)
Summary: COU/VS  Anticoagulant Therapy Managed by: [name]. Ceasar Lund  PharmD CACP PCP: [place] Attending: Eppie Gibson MD, [place] Indication 1: TIA/CVA Indication 2: Aftercare long term use Anticoagulants V58.61, V58.83 Start date: 07/09/1992 Duration: Indefinite Patient Assessment Part 2:  Have you MISSED ANY DOSES or CHANGED TABLETS?  No missed Warfarin doses or changed tablets.  Have you had any BRUISING or BLEEDING ( nose or gum bleeds,blood in urine or stool)?  No reported bruising or bleeding in nose, gums, urine, stool.  Have you STARTED or STOPPED any MEDICATIONS, including OTC meds,herbals or supplements?  No other medications or herbal supplements were started or stopped.  Have you CHANGED your DIET, especially green vegetables,or ALCOHOL intake?  No changes in diet or alcohol intake.  Have you had any ILLNESSES or HOSPITALIZATIONS?  No reported illnesses or hospitalizations  Have you had any signs of CLOTTING?(chest discomfort,dizziness,shortness of breath,arms tingling,slurred speech,swelling or redness in leg)    No chest discomfort, dizziness, shortness of breath, tingling in arm, slurred speech, swelling, or redness in leg.     Treatment  Target INR: 2.0-3.0 INR: 4.4  Date: 11/10/2009 Regimen In:  48.75mg /week INR reflects regimen in: 4.4  New  Tablet strength: : 7.5mg  Regimen Out:     Sunday: 1 Tablet     Monday: 1/2 Tablet     Tuesday: 1 Tablet     Wednesday: 1 Tablet     Thursday: 1/2 Tablet      Friday: 1 Tablet     Saturday: 1 Tablet Total Weekly: 45.0mg /week mg  Next INR Due: 11/24/2009 Adjusted by: [name]. Elie Confer III PharmD CACP   Return to anticoagulation clinic:  11/24/2009 Time of next visit: 1130  Hold:  1 Days     Allergies: No Known Drug Allergies

## 2010-08-25 NOTE — Assessment & Plan Note (Signed)
Summary: COU/CH  Anticoagulant Therapy Managed by: [name]. Ceasar Lund  PharmD CACP PCP: [place] Attending: Nance Pew MD, Hebert Soho  Indication 1: TIA/CVA Indication 2: Aftercare long term use Anticoagulants V58.61, V58.83 Start date: 07/09/1992 Duration: Indefinite  Patient Assessment Reviewed by: [name] PharmD  February 23, 2010 Medication review: verified warfarin dosage & schedule,verified previous prescription medications, verified doses & any changes, verified new medications, reviewed OTC medications, reviewed OTC health products-vitamins supplements etc Complications: none Dietary changes: none   Health status changes: none   Lifestyle changes: none   Recent/future hospitalizations: none   Recent/future procedures: none   Recent/future dental: none Patient Assessment Part 2:  Have you MISSED ANY DOSES or CHANGED TABLETS?  No missed Warfarin doses or changed tablets.  Have you had any BRUISING or BLEEDING ( nose or gum bleeds,blood in urine or stool)?  No reported bruising or bleeding in nose, gums, urine, stool.  Have you STARTED or STOPPED any MEDICATIONS, including OTC meds,herbals or supplements?  No other medications or herbal supplements were started or stopped.  Have you CHANGED your DIET, especially green vegetables,or ALCOHOL intake?  No changes in diet or alcohol intake.  Have you had any ILLNESSES or HOSPITALIZATIONS?  No reported illnesses or hospitalizations  Have you had any signs of CLOTTING?(chest discomfort,dizziness,shortness of breath,arms tingling,slurred speech,swelling or redness in leg)    No chest discomfort, dizziness, shortness of breath, tingling in arm, slurred speech, swelling, or redness in leg.     Treatment  Target INR: 2.0-3.0 INR: 2.7  Date: 02/23/2010 Regimen In:  37.5mg /week INR reflects regimen in: 2.7  New  Tablet strength: : 7.5mg  Regimen Out:     Sunday: 1/2 Tablet     Monday: 1 Tablet     Tuesday: 1/2  Tablet     Wednesday: 1 Tablet     Thursday: 1/2 Tablet      Friday: 1 Tablet     Saturday: 1/2 Tablet Total Weekly: 37.5mg /week mg  Next INR Due: 03/16/2010 Adjusted by: [name]. Elie Confer III PharmD CACP   Return to anticoagulation clinic:  03/16/2010 Time of next visit: 1015    Allergies: No Known Drug Allergies

## 2010-08-25 NOTE — Assessment & Plan Note (Signed)
Summary: DM TEACHING/DS   Vital Signs:  Patient profile:   77 year old female Weight:      174 pounds Is Patient Diabetic? Yes Did you bring your meter with you today? Yes Comments 1 slice bread with sausage, banana, coffee, vegetable juice after 9 AM    Allergies: No Known Drug Allergies  Diabetes Management Exam:    Foot Exam (with socks and/or shoes not present):       Sensory-Monofilament:          Left foot: normal          Right foot: normal   Complete Medication List: 1)  Furosemide 40 Mg Tabs (Furosemide) .... Take 1 tablet by mouth once a day 2)  Benazepril Hcl 40 Mg Tabs (Benazepril hcl) .... Take 1 tablet by mouth once daily 3)  Glipizide 10 Mg Xr24h-tab (Glipizide) .... Take 1 tablet by mouth once a day 4)  Norvasc 10 Mg Tabs (Amlodipine besylate) .... Take 1 tablet by mouth once daily 5)  Simvastatin 40 Mg Tabs (Simvastatin) .... Take 1 tablet by mouth once daily 6)  Januvia 100 Mg Tabs (Sitagliptin phosphate) .... Take 1/2(50mg ) tablet by mouth once daily unless directed otherwise. 7)  Coumadin 7.5 Mg Tabs (Warfarin sodium) .... One tablet po daily or take as directed 8)  Colcrys 0.6 Mg Tabs (Colchicine) .... Take 1 tablet by mouth two times a day as needed for gout flare 9)  Indomethacin 25 Mg Caps (Indomethacin) .... Take one tablet three times a day for 5 days 10)  Flintstones Plus Calcium Chew (Pediatric multiple vit-c-fa) .... Take as directed 11)  Chewable Calcium/d 300-100 Mg-unit Chew (Calcium-vitamin d) .... Take as directed 12)  Voltaren 1 % Gel (Diclofenac sodium) .... Apply to knees two times a day as directed  Other Orders: T-Hgb A1C (in-house) [id number]) T- Capillary Blood Glucose [id number]) DSMT(Medicare) Individual, 30 Minutes [id number])  Diabetes Self Management Training  PCP: Rhea Pink  DO Date diagnosed with diabetes: 07/27/2003 Diabetes Type: Type 2 non-insulin Current smoking Status: never  Vital Signs Todays Weight: 174lb  in in-lbs    Diabetes Medications:  Comments: Brought meter whihc was downloaded: averge of fasting blood sugars x 3 months is 114, highest readings 213 x1 later in teh day. CBg after breakast appropriate today discussed with patient    Last Physician foot exam:  04/16/2010 Next foot exam due: 04/17/2011  Estimated /Usual Carb Intake Breakfast # of Carbs/Grams oatmeal or 1 slice bread, banana, vegetable juice Lunch # of Carbs/Grams 1 sandwich Dinner # of Carbs/Grams well balanced meal  Nutrition assessment Weight change: Loss Amount of change: 5 pounds which is approrpaite over 3 months with dietary and physical activity changes she has made ETOH : No What beverages do you drink?  has cut way back on sweet tea and limits bread to 2 slice a day Diabetes Disease Process  Discussed today  Medications State name-action-dose-duration-side effects-and time to take medication: Needs review/assistance    Nutritional Management Identify what foods most often affect blood glucose: Needs review/assistance    Verbalize importance of controlling food portions: Demonstrates competencyState changes planned for home meals/snacks: Demonstrates competency    Monitoring State purpose and frequency of monitoring BG-ketones-HgbA1C  : Needs review/assistance   State target blood glucose and HgbA1C goals: Needs review/assistance    Complications  Exercise States importance of exercise: Demonstrates competencyStates effect of exercise on blood glucose: Demonstrates competencyVerbalizes safety measures for exercise related to diabetes: Demonstrates competency   Diabetes  Management Education Done: 04/16/2010    BEHAVIORAL GOAL FOLLOW UP Incorporating physical activity into lifestyle: 50%of the time  Goal attained Incorporating appropriate nutritional management: Most of the time Specific goal set today: Doesn't like water, has been drinking less sweet tea and sugar free lemade, less coffee. only 1-2  slice bread a day.  would like ot walk more, but is walking       Diabetes Self Management Support: family and clinic staff Follow-up:every 3 months for 30 minutes    Last LDL:                                                 71 (06/27/2008 9:51:00 PM)        Diabetic Foot Exam Foot Inspection Is there a history of a foot ulcer?              No Is there a foot ulcer now?              No Can the patient see the bottom of their feet?          No Are the shoes appropriate in style and fit?          Yes Is there swelling or an abnormal foot shape?          No Are the toenails long?                No Are the toenails thick?                No Are the toenails ingrown?              No Is there heavy callous build-up?              No Is there a claw toe deformity?                          No Is there elevated skin temperature?            No Is there limited ankle dorsiflexion?            No Is there foot or ankle muscle weakness?            No Do you have pain in calf while walking?           No      Diabetic Foot Care Education :Patient educated on appropriate care of diabetic feet.  Pulse Check          Right Foot          Left Foot Posterior Tibial:        not assessed            Dorsalis Pedis:        present            not assessed  High Risk Feet? No Set Next Diabetic Foot Exam here: 04/17/2011   10-g (5.07) Semmes-Weinstein Monofilament Test Performed by: Barnabas Harries RD          Right Foot          Left Foot Site 1         normal         normal Site 4         normal  normal Site 5         normal         normal Site 6         normal         normal  Impression      normal         normal

## 2010-08-25 NOTE — Assessment & Plan Note (Signed)
Summary: COU/VS  Anticoagulant Therapy Managed by: [name]. Ceasar Lund  PharmD CACP PCP: [place] Attending: Eppie Gibson MD, Lawrence Indication 1: TIA/CVA Indication 2: Aftercare long term use Anticoagulants V58.61, V58.83 Start date: 07/09/1992 Duration: Indefinite  Patient Assessment Reviewed by: [name] PharmD  August 04, 2009 Medication review: verified warfarin dosage & schedule,verified previous prescription medications, verified doses & any changes, verified new medications, reviewed OTC medications, reviewed OTC health products-vitamins supplements etc Complications: none Dietary changes: none   Health status changes: none   Lifestyle changes: none   Recent/future hospitalizations: none   Recent/future procedures: none   Recent/future dental: none Patient Assessment Part 2:  Have you MISSED ANY DOSES or CHANGED TABLETS?  No missed Warfarin doses or changed tablets.  Have you had any BRUISING or BLEEDING ( nose or gum bleeds,blood in urine or stool)?  No reported bruising or bleeding in nose, gums, urine, stool.  Have you STARTED or STOPPED any MEDICATIONS, including OTC meds,herbals or supplements?  No other medications or herbal supplements were started or stopped.  Have you CHANGED your DIET, especially green vegetables,or ALCOHOL intake?  No changes in diet or alcohol intake.  Have you had any ILLNESSES or HOSPITALIZATIONS?  No reported illnesses or hospitalizations  Have you had any signs of CLOTTING?(chest discomfort,dizziness,shortness of breath,arms tingling,slurred speech,swelling or redness in leg)    No chest discomfort, dizziness, shortness of breath, tingling in arm, slurred speech, swelling, or redness in leg.     Treatment  Target INR: 2.0-3.0 INR: 2.7  Date: 08/04/2009 Regimen In:  37.5mg /week INR reflects regimen in: 2.7  New  Tablet strength: : 7.5mg  Regimen Out:     Sunday: 1 Tablet     Monday: 1/2 Tablet     Tuesday: 1  Tablet     Wednesday: 1/2 Tablet     Thursday: 1 Tablet      Friday: 1/2 Tablet     Saturday: 1 Tablet Total Weekly: 41.25mg /week mg  Next INR Due: 08/25/2009 Adjusted by: [name]. Elie Confer III PharmD CACP   Return to anticoagulation clinic:  08/25/2009 Time of next visit: 1045   Comments: Patient counseled/cautioned regarding falls avoidance with impending inclement/icy conditions expected within next 1-2 days.  Allergies: No Known Drug Allergies

## 2010-08-25 NOTE — Assessment & Plan Note (Signed)
Summary: COU/CH  Anticoagulant Therapy Managed by: [name]. Ceasar Lund  PharmD CACP PCP: [place] Attending: Lynnae January MD, Benjamine Mola Indication 1: TIA/CVA Indication 2: Aftercare long term use Anticoagulants V58.61, V58.83 Start date: 07/09/1992 Duration: Indefinite  Patient Assessment Reviewed by: [name] PharmD  June 15, 2010 Medication review: verified warfarin dosage & schedule,verified previous prescription medications, verified doses & any changes, verified new medications, reviewed OTC medications, reviewed OTC health products-vitamins supplements etc Complications: none Dietary changes: none   Health status changes: none   Lifestyle changes: none   Recent/future hospitalizations: none   Recent/future procedures: none   Recent/future dental: none Patient Assessment Part 2:  Have you MISSED ANY DOSES or CHANGED TABLETS?  No missed Warfarin doses or changed tablets.  Have you had any BRUISING or BLEEDING ( nose or gum bleeds,blood in urine or stool)?  No reported bruising or bleeding in nose, gums, urine, stool.  Have you STARTED or STOPPED any MEDICATIONS, including OTC meds,herbals or supplements?  No other medications or herbal supplements were started or stopped.  Have you CHANGED your DIET, especially green vegetables,or ALCOHOL intake?  No changes in diet or alcohol intake.  Have you had any ILLNESSES or HOSPITALIZATIONS?  No reported illnesses or hospitalizations  Have you had any signs of CLOTTING?(chest discomfort,dizziness,shortness of breath,arms tingling,slurred speech,swelling or redness in leg)    No chest discomfort, dizziness, shortness of breath, tingling in arm, slurred speech, swelling, or redness in leg.     Treatment  Target INR: 2.0-3.0 INR: 2.4  Date: 06/15/2010 Regimen In:  33.75mg /week INR reflects regimen in: 2.4  New  Tablet strength: : 7.5mg  Regimen Out:     Sunday: 1/2 Tablet     Monday: 1 Tablet     Tuesday: 1/2  Tablet     Wednesday: 1 Tablet     Thursday: 1/2 Tablet      Friday: 1 Tablet     Saturday: 1/2 Tablet Total Weekly: 37.5mg /week mg  Next INR Due: 07/06/2010 Adjusted by: [name]. Elie Confer III PharmD CACP   Return to anticoagulation clinic:  07/06/2010 Time of next visit: 1515    Allergies: No Known Drug Allergies

## 2010-08-25 NOTE — Assessment & Plan Note (Signed)
Summary: diabetes/dmr   Vital Signs:  Patient profile:   77 year old female Weight:      179.3 pounds BMI:     30.89 Is Patient Diabetic? Yes Did you bring your meter with you today? Yes CBG Result 226 CBG Device ID [employer] Contour 2   Allergies: No Known Drug Allergies   Complete Medication List: 1)  Furosemide 40 Mg Tabs (Furosemide) .... Take 1 tablet by mouth once a day 2)  Benazepril Hcl 40 Mg Tabs (Benazepril hcl) .... Take 1 tablet by mouth once daily 3)  Glipizide 10 Mg Xr24h-tab (Glipizide) .... Take 1 tablet by mouth once a day 4)  Norvasc 10 Mg Tabs (Amlodipine besylate) .... Take 1 tablet by mouth once daily 5)  Simvastatin 40 Mg Tabs (Simvastatin) .... Take 1 tablet by mouth once daily 6)  Januvia 100 Mg Tabs (Sitagliptin phosphate) .... Take 1/2(50mg ) tablet by mouth once daily unless directed otherwise. 7)  Coumadin 7.5 Mg Tabs (Warfarin sodium) .... One tablet po daily or take as directed 8)  Colcrys 0.6 Mg Tabs (Colchicine) .... Take 1 tablet by mouth two times a day as needed for gout flare 9)  Indomethacin 25 Mg Caps (Indomethacin) .... Take one tablet three times a day for 5 days 10)  Flintstones Plus Calcium Chew (Pediatric multiple vit-c-fa) .... Take as directed 11)  Chewable Calcium/d 300-100 Mg-unit Chew (Calcium-vitamin d) .... Take as directed 12)  Voltaren 1 % Gel (Diclofenac sodium) .... Apply to knees two times a day as directed  Other Orders: DSMT(Medicare) Individual, 30 Minutes ([id number])  Diabetes Self Management Training  PCP: Rhea Pink  DO Diabetes Type: Type 2 non-insulin Other persons present: dtr in law in waiting room Current smoking Status: never  Vital Signs Todays Weight: 179.3lb  in BMI 30.89in-lbs   Assessment Daily activities: watches TV, housework Sources of Support: family, aide Special needs or Barriers: prefers wlkaer or cart to push to walk. needed refresher about foods that riase blood sugar  Potential Barriers  Visual Impairment  Risk for falls  Transportation  Economic/Supplies   transportation  Diabetes Medications:  Comments: brought meter- downloaded, average 125- mostly fasting CBGs and likely that her problem is post meal with current A1C,note A1C improved, but still above goal. tested CBG twice after meals in past months and both were in the 200s, fogot meeicnie this am in a hurry to get here, CBG was 226 3 hours after small brekafast of oatmeal. Fasting CBgs are at goal < 100- 130s x 1 month, before this were 130-150 range. she dis not want to do more than briefly look at the insulin pen today sayoing that she lans to not need insulin. Discussed how following recommendations dosiucssed today for eating healthier nad moing more may help reach ths goal.     Monitoring Self monitoring blood glucose 1 time a day Name of Meter  Ascencia Contour 2 Measures urine ketones? No  Recent Episodes of: Requiring Help from another person  Hyperglycemia : Yes Hypoglycemia: No   Wears Medical I.D. No   Estimated /Usual Carb Intake Breakfast # of Carbs/Grams oatmeal with raisins, coffee Dinner # of Carbs/Grams 2 tomato sandwiches for snack or peanutbutter crackers on ritz crackers Bedtime # of Carbs/Grams fried spam, vegetables,   Nutrition assessment ETOH : No What beverages do you drink?  sweet tea from [employer]- 12-20 ounces a day, 1 boost milkshake per day, 3 large mugs regula coffee with little b=cream, sometimes sugar sometimes splenda Diabetes  Disease Process  Discussed today State own type of diabetes: Demonstrates competencyState diabetes is treated by meal plan-exercise-medication-monitoring-education: Needs review/assistance Medications State name-action-dose-duration-side effects-and time to take medication: Needs review/assistance   State appropriate timing of food related to medication: Needs review/assistance    Nutritional Management Identify what foods most often affect  blood glucose: Needs review/assistance    Verbalize importance of controlling food portions: Needs review/assistance   State importance of spacing and not omitting meals and snacks: Demonstrates competency   State changes planned for home meals/snacks: Needs review/assistance    Monitoring State purpose and frequency of monitoring BG-ketones-HgbA1C  : Needs review/assistance   Perform glucose monitoring/ketone testing and record results correctly: Needs review/assistance    State target blood glucose and HgbA1C goals: Needs review/assistance    Complications State the causes-signs and symptoms and prevention of Hyperglycemia: Needs review/assistanceExplain proper treatment of hyperglycemia: Needs review/assistance    Exercise States effect of exercise on blood glucose: Needs review/assistance   Verbalizes safety measures for exercise related to diabetes: Needs review/assistance Lifestyle changes:Goal setting and Problem solving Identify Family/SO role in managing diabetes: Needs review/assistance Psychosocial Adjustment Name two ways of obtaining support from family/friends: Needs review/assistanceDiabetes Management Education Done: 12/29/2009    BEHAVIORAL GOALS INITIAL Incorporating appropriate nutritional management: drink 1/2 can boost per day. limit bread to 2 slices a day and ta to 1/2- 1 cup per day        Diabetes Self Management Support: family and clinic staff Follow-up:will call to arrange follow up by phone &  in office on same day as coumadin appointment

## 2010-08-25 NOTE — Assessment & Plan Note (Signed)
Summary: COU/VS  Anticoagulant Therapy Managed by: [name]. Ceasar Lund  PharmD CACP PCP: [place] Attending: Ola Spurr MD, David Indication 1: TIA/CVA Indication 2: Aftercare long term use Anticoagulants V58.61, V58.83 Start date: 07/09/1992 Duration: Indefinite  Patient Assessment Reviewed by: [name] PharmD  October 20, 2009 Medication review: verified warfarin dosage & schedule,verified previous prescription medications, verified doses & any changes, verified new medications, reviewed OTC medications, reviewed OTC health products-vitamins supplements etc Complications: none Dietary changes: none   Health status changes: none   Lifestyle changes: none   Recent/future hospitalizations: none   Recent/future procedures: none   Recent/future dental: none Patient Assessment Part 2:  Have you MISSED ANY DOSES or CHANGED TABLETS?  No missed Warfarin doses or changed tablets.  Have you had any BRUISING or BLEEDING ( nose or gum bleeds,blood in urine or stool)?  No reported bruising or bleeding in nose, gums, urine, stool.  Have you STARTED or STOPPED any MEDICATIONS, including OTC meds,herbals or supplements?  No other medications or herbal supplements were started or stopped.  Have you CHANGED your DIET, especially green vegetables,or ALCOHOL intake?  No changes in diet or alcohol intake.  Have you had any ILLNESSES or HOSPITALIZATIONS?  No reported illnesses or hospitalizations  Have you had any signs of CLOTTING?(chest discomfort,dizziness,shortness of breath,arms tingling,slurred speech,swelling or redness in leg)    No chest discomfort, dizziness, shortness of breath, tingling in arm, slurred speech, swelling, or redness in leg.     Treatment  Target INR: 2.0-3.0 INR: 3.7  Date: 09/22/2009 Regimen In:  41.25mg /week  New  Tablet strength: : 7.5mg  Regimen Out:     Sunday: 1 Tablet     Monday: 1 Tablet     Tuesday: 1 Tablet     Wednesday: 1/2 Tablet  Thursday: 1 Tablet      Friday: 1 Tablet     Saturday: 1 Tablet Total Weekly: 48.75mg /week mg  Next INR Due: 11/10/2009 Adjusted by: [name]. Daiya Tamer III PharmD CACP   Return to anticoagulation clinic:  11/10/2009 Time of next visit: 1115    Allergies: No Known Drug Allergies  Appended Document: COU/VS Patient's INR for 28-Mar-11 was 1.5 (Goal = 2. 0 - 3.0). This value was for a regimen of 41.25mg /wk; accordingly, her regimen was INCREASED to 48.75mg /wk. She had called--citing she heard me state her blood was "too THIN"---when in fact, it had been reported to her--that her blood was "too THICK". She now states she understands.

## 2010-08-25 NOTE — Assessment & Plan Note (Signed)
Summary: COU/CH  Anticoagulant Therapy Managed by: [name]. Ceasar Lund  PharmD CACP PCP: [place] Attending: Lynnae January MD, Benjamine Mola Indication 1: TIA/CVA Indication 2: Aftercare long term use Anticoagulants V58.61, V58.83 Start date: 07/09/1992 Duration: Indefinite  Patient Assessment Reviewed by: [name] PharmD  March 16, 2010 Medication review: verified warfarin dosage & schedule,verified previous prescription medications, verified doses & any changes, verified new medications, reviewed OTC medications, reviewed OTC health products-vitamins supplements etc Complications: none Dietary changes: none   Health status changes: none   Lifestyle changes: none   Recent/future hospitalizations: none   Recent/future procedures: none   Recent/future dental: none Patient Assessment Part 2:  Have you MISSED ANY DOSES or CHANGED TABLETS?  No missed Warfarin doses or changed tablets.  Have you had any BRUISING or BLEEDING ( nose or gum bleeds,blood in urine or stool)?  No reported bruising or bleeding in nose, gums, urine, stool.  Have you STARTED or STOPPED any MEDICATIONS, including OTC meds,herbals or supplements?  No other medications or herbal supplements were started or stopped.  Have you CHANGED your DIET, especially green vegetables,or ALCOHOL intake?  No changes in diet or alcohol intake.  Have you had any ILLNESSES or HOSPITALIZATIONS?  No reported illnesses or hospitalizations  Have you had any signs of CLOTTING?(chest discomfort,dizziness,shortness of breath,arms tingling,slurred speech,swelling or redness in leg)    No chest discomfort, dizziness, shortness of breath, tingling in arm, slurred speech, swelling, or redness in leg.     Treatment  Target INR: 2.0-3.0 INR: 2.0  Date: 03/16/2010 Regimen In:  37.5mg /week INR reflects regimen in: 2.0  New  Tablet strength: : 7.5mg  Regimen Out:     Sunday: 1/2 Tablet     Monday: 1 Tablet     Tuesday: 1/2  Tablet     Wednesday: 1 Tablet     Thursday: 1/2 Tablet      Friday: 1 Tablet     Saturday: 1/2 Tablet Total Weekly: 37.5mg /week mg  Next INR Due: 04/13/2010 Adjusted by: [name]. Elie Confer III PharmD CACP   Return to anticoagulation clinic:  04/13/2010 Time of next visit: 1030    Allergies: No Known Drug Allergies

## 2010-08-25 NOTE — Assessment & Plan Note (Signed)
Summary: COU/CH  Anticoagulant Therapy Managed by: [name]. Chelsea Mcdaniel  PharmD CACP PCP: [place] Attending: Rhea Pink DO Indication 1: TIA/CVA Indication 2: Aftercare long term use Anticoagulants V58.61, V58.83 Start date: 07/09/1992 Duration: Indefinite  Patient Assessment Reviewed by: [name] PharmD  Nov 24, 2009 Medication review: verified warfarin dosage & schedule,verified previous prescription medications, verified doses & any changes, verified new medications, reviewed OTC medications, reviewed OTC health products-vitamins supplements etc Complications: none Dietary changes: none   Health status changes: none   Lifestyle changes: none   Recent/future hospitalizations: none   Recent/future procedures: none   Recent/future dental: none Patient Assessment Part 2:  Have you MISSED ANY DOSES or CHANGED TABLETS?  No missed Warfarin doses or changed tablets.  Have you had any BRUISING or BLEEDING ( nose or gum bleeds,blood in urine or stool)?  No reported bruising or bleeding in nose, gums, urine, stool.  Have you STARTED or STOPPED any MEDICATIONS, including OTC meds,herbals or supplements?  No other medications or herbal supplements were started or stopped.  Have you CHANGED your DIET, especially green vegetables,or ALCOHOL intake?  No changes in diet or alcohol intake.  Have you had any ILLNESSES or HOSPITALIZATIONS?  No reported illnesses or hospitalizations  Have you had any signs of CLOTTING?(chest discomfort,dizziness,shortness of breath,arms tingling,slurred speech,swelling or redness in leg)    No chest discomfort, dizziness, shortness of breath, tingling in arm, slurred speech, swelling, or redness in leg.     Treatment  Target INR: 2.0-3.0 INR: 2.7  Date: 11/24/2009 Regimen In:  45.0mg /week INR reflects regimen in: 2.7  New  Tablet strength: : 7.5mg  Regimen Out:     Sunday: 1 Tablet     Monday: 1/2 Tablet     Tuesday: 1 Tablet  Wednesday: 1 Tablet     Thursday: 1/2 Tablet      Friday: 1 Tablet     Saturday: 1 Tablet Total Weekly: 45.0mg /week mg  Next INR Due: 12/29/2009 Adjusted by: [name]. Elie Confer III PharmD CACP   Return to anticoagulation clinic:  12/29/2009 Time of next visit: 1115    Allergies: No Known Drug Allergies

## 2010-08-25 NOTE — Letter (Signed)
Summary: DIABETIC METER DOWNLOAD  DIABETIC METER DOWNLOAD   Imported By: Enedina Finner 04/20/2010 15:45:09  _____________________________________________________________________  External Attachment:    Type:   Image     Comment:   External Document

## 2010-08-25 NOTE — Progress Notes (Signed)
Summary: diabetes support/dmr  Phone Note Outgoing Call   Call placed by: Barnabas Harries RD,CDE,  Dec 01, 2009 8:59 AM Summary of Call: called patient to let her know we had a new meter mailed ot her. She told me she found her original one. Asked her to keep the second one as a back up and to always bring both to her doctors appointments if she is using both. We also scheduled MNT for carb reveiwfor same day as DR. Groce's coumadin appointment (A1C is 7.7%)

## 2010-08-25 NOTE — Assessment & Plan Note (Signed)
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

## 2010-08-25 NOTE — Miscellaneous (Signed)
  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 (03/16/2010 12:38)      Past History:  Past Medical History: ANEMIA NEC (ICD-285.8) BLINDNESS, 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

## 2010-08-25 NOTE — Assessment & Plan Note (Signed)
Summary: COU/VS  Anticoagulant Therapy Managed by: [name]. Chelsea Mcdaniel  PharmD CACP PCP: [place] Attending: Lazarus Salines, Beth Indication 1: TIA/CVA Indication 2: Aftercare long term use Anticoagulants V58.61, V58.83 Start date: 07/09/1992 Duration: Indefinite  Patient Assessment Reviewed by: [name] PharmD  September 22, 2009 Medication review: verified warfarin dosage & schedule,verified previous prescription medications, verified doses & any changes, verified new medications, reviewed OTC medications, reviewed OTC health products-vitamins supplements etc Complications: none Dietary changes: none   Health status changes: none   Lifestyle changes: none   Recent/future hospitalizations: none   Recent/future procedures: none   Recent/future dental: none Patient Assessment Part 2:  Have you MISSED ANY DOSES or CHANGED TABLETS?  No missed Warfarin doses or changed tablets.  Have you had any BRUISING or BLEEDING ( nose or gum bleeds,blood in urine or stool)?  No reported bruising or bleeding in nose, gums, urine, stool.  Have you STARTED or STOPPED any MEDICATIONS, including OTC meds,herbals or supplements?  No other medications or herbal supplements were started or stopped.  Have you CHANGED your DIET, especially green vegetables,or ALCOHOL intake?  No changes in diet or alcohol intake.  Have you had any ILLNESSES or HOSPITALIZATIONS?  No reported illnesses or hospitalizations  Have you had any signs of CLOTTING?(chest discomfort,dizziness,shortness of breath,arms tingling,slurred speech,swelling or redness in leg)    No chest discomfort, dizziness, shortness of breath, tingling in arm, slurred speech, swelling, or redness in leg.     Treatment  Target INR: 2.0-3.0 INR: 3.7  Date: 09/22/2009 Regimen In:  45.0mg /week INR reflects regimen in: 3.7  New  Tablet strength: : 7.5mg  Regimen Out:     Sunday: 1 Tablet     Monday: 1/2 Tablet     Tuesday: 1  Tablet     Wednesday: 1/2 Tablet     Thursday: 1 Tablet      Friday: 1/2 Tablet     Saturday: 1 Tablet Total Weekly: 41.25mg /week mg  Next INR Due: 10/20/2009 Adjusted by: [name]. Elie Confer III PharmD CACP   Return to anticoagulation clinic:  10/20/2009 Time of next visit: 1130    Allergies: No Known Drug Allergies

## 2010-08-27 ENCOUNTER — Other Ambulatory Visit: Payer: Self-pay | Admitting: *Deleted

## 2010-08-27 NOTE — Assessment & Plan Note (Signed)
Summary: COU/CH  Anticoagulant Therapy Managed by: [name]. Ceasar Lund  PharmD CACP PCP: [place] Attending: Philbert Riser MD, [place] Indication 1: TIA/CVA Indication 2: Aftercare long term use Anticoagulants V58.61, V58.83 Start date: 07/09/1992 Duration: Indefinite  Patient Assessment Reviewed by: [name] PharmD  July 06, 2010 Medication review: verified warfarin dosage & schedule,verified previous prescription medications, verified doses & any changes, verified new medications, reviewed OTC medications, reviewed OTC health products-vitamins supplements etc Complications: none Dietary changes: none   Health status changes: none   Lifestyle changes: none   Recent/future hospitalizations: none   Recent/future procedures: none   Recent/future dental: none Patient Assessment Part 2:  Have you MISSED ANY DOSES or CHANGED TABLETS?  No missed Warfarin doses or changed tablets.  Have you had any BRUISING or BLEEDING ( nose or gum bleeds,blood in urine or stool)?  No reported bruising or bleeding in nose, gums, urine, stool.  Have you STARTED or STOPPED any MEDICATIONS, including OTC meds,herbals or supplements?  No other medications or herbal supplements were started or stopped.  Have you CHANGED your DIET, especially green vegetables,or ALCOHOL intake?  No changes in diet or alcohol intake.  Have you had any ILLNESSES or HOSPITALIZATIONS?  No reported illnesses or hospitalizations  Have you had any signs of CLOTTING?(chest discomfort,dizziness,shortness of breath,arms tingling,slurred speech,swelling or redness in leg)    No chest discomfort, dizziness, shortness of breath, tingling in arm, slurred speech, swelling, or redness in leg.     Treatment  Target INR: 2.0-3.0 INR: 2.6  Date: 07/06/2010 Regimen In:  37.5mg /week INR reflects regimen in: 2.6  New  Tablet strength: : 7.5mg  Regimen Out:     Sunday: 1/2 Tablet     Monday: 1 Tablet     Tuesday: 1/2  Tablet     Wednesday: 1 Tablet     Thursday: 1/2 Tablet      Friday: 1 Tablet     Saturday: 1/2 Tablet Total Weekly: 37.5mg /week mg  Next INR Due: 08/03/2010 Adjusted by: [name]. Elie Confer III PharmD CACP   Return to anticoagulation clinic:  08/03/2010 Time of next visit: 1545    Allergies: No Known Drug Allergies

## 2010-08-27 NOTE — Assessment & Plan Note (Signed)
Summary: COU/CH  Anticoagulant Therapy Managed by: [name]. Ceasar Lund  PharmD CACP PCP: [place] Attending: Lynnae January MD, Benjamine Mola Indication 1: TIA/CVA Indication 2: Aftercare long term use Anticoagulants V58.61, V58.83 Start date: 07/09/1992 Duration: Indefinite  Patient Assessment Reviewed by: [name] PharmD  August 03, 2010 Medication review: verified warfarin dosage & schedule,verified previous prescription medications, verified doses & any changes, verified new medications, reviewed OTC medications, reviewed OTC health products-vitamins supplements etc Complications: none Dietary changes: none   Health status changes: none   Lifestyle changes: none   Recent/future hospitalizations: none   Recent/future procedures: none   Recent/future dental: none Patient Assessment Part 2:  Have you MISSED ANY DOSES or CHANGED TABLETS?  No missed Warfarin doses or changed tablets.  Have you had any BRUISING or BLEEDING ( nose or gum bleeds,blood in urine or stool)?  No reported bruising or bleeding in nose, gums, urine, stool.  Have you STARTED or STOPPED any MEDICATIONS, including OTC meds,herbals or supplements?  No other medications or herbal supplements were started or stopped.  Have you CHANGED your DIET, especially green vegetables,or ALCOHOL intake?  No changes in diet or alcohol intake.  Have you had any ILLNESSES or HOSPITALIZATIONS?  No reported illnesses or hospitalizations  Have you had any signs of CLOTTING?(chest discomfort,dizziness,shortness of breath,arms tingling,slurred speech,swelling or redness in leg)    No chest discomfort, dizziness, shortness of breath, tingling in arm, slurred speech, swelling, or redness in leg.     Treatment  Target INR: 2.0-3.0 INR: 2.1  Date: 08/03/2010 Regimen In:  37.5mg /week INR reflects regimen in: 2.1  New  Tablet strength: : 7.5mg  Regimen Out:     Sunday: 1 Tablet     Monday: 1/2 Tablet     Tuesday: 1  Tablet     Wednesday: 1/2 Tablet     Thursday: 1 Tablet      Friday: 1/2 Tablet     Saturday: 1 Tablet Total Weekly: 41.25mg /week mg  Next INR Due: 08/31/2010 Adjusted by: [name]. Elie Confer III PharmD CACP   Return to anticoagulation clinic:  08/31/2010 Time of next visit: 1600    Allergies: No Known Drug Allergies

## 2010-08-29 ENCOUNTER — Other Ambulatory Visit: Payer: Self-pay | Admitting: *Deleted

## 2010-08-31 ENCOUNTER — Ambulatory Visit (INDEPENDENT_AMBULATORY_CARE_PROVIDER_SITE_OTHER): Payer: Medicare Other | Admitting: Pharmacist

## 2010-08-31 ENCOUNTER — Encounter: Payer: Self-pay | Admitting: Internal Medicine

## 2010-08-31 DIAGNOSIS — I639 Cerebral infarction, unspecified: Secondary | ICD-10-CM

## 2010-08-31 DIAGNOSIS — I6350 Cerebral infarction due to unspecified occlusion or stenosis of unspecified cerebral artery: Secondary | ICD-10-CM

## 2010-08-31 DIAGNOSIS — Z7901 Long term (current) use of anticoagulants: Secondary | ICD-10-CM

## 2010-08-31 DIAGNOSIS — Z86718 Personal history of other venous thrombosis and embolism: Secondary | ICD-10-CM

## 2010-08-31 LAB — POCT INR: INR: 1.6

## 2010-08-31 MED ORDER — WARFARIN SODIUM 7.5 MG PO TABS: 7.5000 mg | ORAL_TABLET | ORAL | Status: DC

## 2010-08-31 MED ORDER — SITAGLIPTIN PHOSPHATE 100 MG PO TABS: 50.0000 mg | ORAL_TABLET | Freq: Every day | ORAL | Status: DC

## 2010-08-31 NOTE — Progress Notes (Signed)
Anti-Coagulation Progress Note  Chelsea Mcdaniel is a 76 y.o. female who is currently on an anti-coagulation 41.25 mg per week regimen.  RECENT RESULTS: Recent results are below, the most recent result is correlated with a dose of  mg. per week: Lab Results  Component Value Date   INR 1.6 08/31/2010   INR 2.1 08/03/2010   INR 2.6 07/06/2010    ANTI-COAG DOSE:   Latest dosing instructions   Total Sun Mon Tue Wed Thu Fri Sat   45 7.5 mg 7.5 mg 7.5 mg 3.75 mg 7.5 mg 7.5 mg 3.75 mg    (7.5 mg1) (7.5 mg1) (7.5 mg1) (7.5 mg0.5) (7.5 mg1) (7.5 mg1) (7.5 mg0.5)         ANTICOAG SUMMARY: Anticoagulation Episode Summary              Current INR goal  Next INR check 09/28/2010   INR from last check 1.6 (08/31/2010)     Weekly max dose (mg)  Target end date    Indications Long-term (current) use of anticoagulants, CVA (cerebral vascular accident), Personal history of DVT (deep vein thrombosis)   INR check location  Preferred lab    Send INR reminders to [facility]   Comments        Provider Role Specialty Phone number   [name]  Internal Medicine [phone]        ANTICOAG TODAY: Anticoagulation Summary as of 08/31/2010              INR goal      Selected INR 1.6 (08/31/2010) Next INR check 09/28/2010   Weekly max dose (mg)  Target end date    Indications Long-term (current) use of anticoagulants, CVA (cerebral vascular accident), Personal history of DVT (deep vein thrombosis)    Anticoagulation Episode Summary              INR check location  Preferred lab    Send INR reminders to [facility]   Comments        Provider Role Specialty Phone number   [name]  Internal Medicine [phone]        PATIENT INSTRUCTIONS: Patient Instructions  Patient instructed to take medications as defined in the Anti-coagulation Track section of this encounter.  Patient instructed to take  today's dose.  Patient verbalized understanding of these instructions.         FOLLOW-UP Return in 4 weeks (on 09/28/2010).  [name], III Pharm.D., CACP

## 2010-08-31 NOTE — Patient Instructions (Signed)
Patient instructed to take medications as defined in the Anti-coagulation Track section of this encounter.  Patient instructed to take today's dose.  Patient verbalized understanding of these instructions.    

## 2010-09-02 ENCOUNTER — Other Ambulatory Visit: Payer: Self-pay | Admitting: Pharmacist

## 2010-09-02 ENCOUNTER — Other Ambulatory Visit: Payer: Self-pay | Admitting: Internal Medicine

## 2010-09-02 MED ORDER — GLIPIZIDE ER 10 MG PO TB24: 10.0000 mg | ORAL_TABLET | Freq: Every day | ORAL | Status: DC

## 2010-09-02 NOTE — Telephone Encounter (Signed)
Last seen 04/16/10 and 3 month F/U requested. No appt in EMR. Sent flag to front desk to sch appt.

## 2010-09-03 ENCOUNTER — Other Ambulatory Visit: Payer: Self-pay | Admitting: *Deleted

## 2010-09-07 MED ORDER — AMLODIPINE BESYLATE 10 MG PO TABS: 10.0000 mg | ORAL_TABLET | Freq: Every day | ORAL | Status: DC

## 2010-09-07 MED ORDER — BENAZEPRIL HCL 40 MG PO TABS: 40.0000 mg | ORAL_TABLET | Freq: Every day | ORAL | Status: DC

## 2010-09-18 ENCOUNTER — Encounter: Payer: Self-pay | Admitting: Internal Medicine

## 2010-09-28 ENCOUNTER — Ambulatory Visit (INDEPENDENT_AMBULATORY_CARE_PROVIDER_SITE_OTHER): Payer: Medicare Other | Admitting: Pharmacist

## 2010-09-28 DIAGNOSIS — I6350 Cerebral infarction due to unspecified occlusion or stenosis of unspecified cerebral artery: Secondary | ICD-10-CM

## 2010-09-28 DIAGNOSIS — I639 Cerebral infarction, unspecified: Secondary | ICD-10-CM

## 2010-09-28 DIAGNOSIS — Z7901 Long term (current) use of anticoagulants: Secondary | ICD-10-CM

## 2010-09-28 DIAGNOSIS — Z86718 Personal history of other venous thrombosis and embolism: Secondary | ICD-10-CM

## 2010-09-28 NOTE — Progress Notes (Signed)
Anti-Coagulation Progress Note  Chelsea Mcdaniel is a 77 y.o. female who is currently on an anti-coagulation regimen.    RECENT RESULTS: Recent results are below, the most recent result is correlated with a dose of 45 mg. per week: Lab Results  Component Value Date   INR 2.6 09/28/2010   INR 1.6 08/31/2010   INR 2.1 08/03/2010    ANTI-COAG DOSE:   Latest dosing instructions   Total Sun Mon Tue Wed Thu Fri Sat   45 7.5 mg 7.5 mg 7.5 mg 3.75 mg 7.5 mg 7.5 mg 3.75 mg    (7.5 mg1) (7.5 mg1) (7.5 mg1) (7.5 mg0.5) (7.5 mg1) (7.5 mg1) (7.5 mg0.5)         ANTICOAG SUMMARY: Anticoagulation Episode Summary              Current INR goal 2.0-3.0 Next INR check 10/26/2010   INR from last check 2.6 (09/28/2010)     Weekly max dose (mg)  Target end date Indefinite   Indications Long-term (current) use of anticoagulants, Personal history of DVT (deep vein thrombosis)   INR check location [facility] Preferred lab    Send INR reminders to [facility]   Comments        Provider Role Specialty Phone number   [name]  Internal Medicine [phone]        ANTICOAG TODAY: Anticoagulation Summary as of 09/28/2010              INR goal 2.0-3.0     Selected INR 2.6 (09/28/2010) Next INR check 10/26/2010   Weekly max dose (mg)  Target end date Indefinite   Indications Long-term (current) use of anticoagulants, Personal history of DVT (deep vein thrombosis)    Anticoagulation Episode Summary              INR check location [facility] Preferred lab    Send INR reminders to [facility]   Comments        Provider Role Specialty Phone number   [name]  Internal Medicine [phone]        PATIENT INSTRUCTIONS: Patient Instructions  Patient instructed to take medications as defined in the Anti-coagulation Track section of this encounter.  Patient instructed to take today's dose.  Patient verbalized understanding of these instructions.         FOLLOW-UP Return in 4 weeks (on 10/26/2010) for Follow up INR.  [name], III Pharm.D., CACP

## 2010-09-28 NOTE — Patient Instructions (Signed)
Patient instructed to take medications as defined in the Anti-coagulation Track section of this encounter.  Patient instructed to take today's dose.  Patient verbalized understanding of these instructions.    

## 2010-10-05 ENCOUNTER — Other Ambulatory Visit: Payer: Self-pay | Admitting: Internal Medicine

## 2010-10-08 LAB — GLUCOSE, CAPILLARY: Glucose-Capillary: 168 mg/dL — ABNORMAL HIGH (ref 70–99)

## 2010-10-12 LAB — GLUCOSE, CAPILLARY: Glucose-Capillary: 284 mg/dL — ABNORMAL HIGH (ref 70–99)

## 2010-10-13 LAB — GLUCOSE, CAPILLARY: Glucose-Capillary: 293 mg/dL — ABNORMAL HIGH (ref 70–99)

## 2010-10-26 ENCOUNTER — Ambulatory Visit: Payer: Medicare Other

## 2010-10-27 LAB — GLUCOSE, CAPILLARY: Glucose-Capillary: 225 mg/dL — ABNORMAL HIGH (ref 70–99)

## 2010-11-02 ENCOUNTER — Ambulatory Visit (INDEPENDENT_AMBULATORY_CARE_PROVIDER_SITE_OTHER): Payer: Medicare Other | Admitting: Pharmacist

## 2010-11-02 DIAGNOSIS — I639 Cerebral infarction, unspecified: Secondary | ICD-10-CM

## 2010-11-02 DIAGNOSIS — Z7901 Long term (current) use of anticoagulants: Secondary | ICD-10-CM

## 2010-11-02 DIAGNOSIS — I6350 Cerebral infarction due to unspecified occlusion or stenosis of unspecified cerebral artery: Secondary | ICD-10-CM

## 2010-11-02 DIAGNOSIS — Z86718 Personal history of other venous thrombosis and embolism: Secondary | ICD-10-CM

## 2010-11-02 NOTE — Progress Notes (Signed)
Anti-Coagulation Progress Note  Chelsea Mcdaniel is a 77 y.o. female who is currently on an anti-coagulation regimen.    RECENT RESULTS: Recent results are below, the most recent result is correlated with a dose of 45 mg. per week: Lab Results  Component Value Date   INR 2.3 11/02/2010   INR 2.6 09/28/2010   INR 1.6 08/31/2010    ANTI-COAG DOSE:   Latest dosing instructions   Total Sun Mon Tue Wed Thu Fri Sat   45 7.5 mg 7.5 mg 7.5 mg 3.75 mg 7.5 mg 7.5 mg 3.75 mg    (7.5 mg1) (7.5 mg1) (7.5 mg1) (7.5 mg0.5) (7.5 mg1) (7.5 mg1) (7.5 mg0.5)         ANTICOAG SUMMARY: Anticoagulation Episode Summary              Current INR goal 2.0-3.0 Next INR check 11/30/2010   INR from last check 2.3 (11/02/2010)     Weekly max dose (mg)  Target end date Indefinite   Indications Long-term (current) use of anticoagulants, Personal history of DVT (deep vein thrombosis)   INR check location [facility] Preferred lab    Send INR reminders to [facility]   Comments        Provider Role Specialty Phone number   [name]  Internal Medicine [phone]        ANTICOAG TODAY: Anticoagulation Summary as of 11/02/2010              INR goal 2.0-3.0     Selected INR 2.3 (11/02/2010) Next INR check 11/30/2010   Weekly max dose (mg)  Target end date Indefinite   Indications Long-term (current) use of anticoagulants, Personal history of DVT (deep vein thrombosis)    Anticoagulation Episode Summary              INR check location [facility] Preferred lab    Send INR reminders to [facility]   Comments        Provider Role Specialty Phone number   [name]  Internal Medicine [phone]        PATIENT INSTRUCTIONS: Patient Instructions  Patient instructed to take medications as defined in the Anti-coagulation Track section of this encounter.  Patient instructed to take today's dose.  Patient verbalized understanding of these instructions.         FOLLOW-UP Return in 4 weeks (on 11/30/2010) for Follow up INR.  [name], III Pharm.D., CACP

## 2010-11-02 NOTE — Patient Instructions (Signed)
Patient instructed to take medications as defined in the Anti-coagulation Track section of this encounter.  Patient instructed to take today's dose.  Patient verbalized understanding of these instructions.    

## 2010-11-03 ENCOUNTER — Other Ambulatory Visit (INDEPENDENT_AMBULATORY_CARE_PROVIDER_SITE_OTHER): Payer: Medicare Other | Admitting: *Deleted

## 2010-11-03 DIAGNOSIS — I10 Essential (primary) hypertension: Secondary | ICD-10-CM

## 2010-11-06 MED ORDER — SIMVASTATIN 40 MG PO TABS: 40.0000 mg | ORAL_TABLET | Freq: Every day | ORAL | Status: DC

## 2010-11-06 MED ORDER — AMLODIPINE BESYLATE 10 MG PO TABS: 10.0000 mg | ORAL_TABLET | Freq: Every day | ORAL | Status: DC

## 2010-11-30 ENCOUNTER — Ambulatory Visit (INDEPENDENT_AMBULATORY_CARE_PROVIDER_SITE_OTHER): Payer: Medicare Other | Admitting: Pharmacist

## 2010-11-30 DIAGNOSIS — I6350 Cerebral infarction due to unspecified occlusion or stenosis of unspecified cerebral artery: Secondary | ICD-10-CM

## 2010-11-30 DIAGNOSIS — Z7901 Long term (current) use of anticoagulants: Secondary | ICD-10-CM

## 2010-11-30 DIAGNOSIS — I639 Cerebral infarction, unspecified: Secondary | ICD-10-CM

## 2010-11-30 DIAGNOSIS — Z86718 Personal history of other venous thrombosis and embolism: Secondary | ICD-10-CM

## 2010-11-30 LAB — POCT INR: INR: 2.9

## 2010-11-30 NOTE — Patient Instructions (Signed)
Patient instructed to take medications as defined in the Anti-coagulation Track section of this encounter.  Patient instructed to take today's dose.  Patient verbalized understanding of these instructions.    

## 2010-11-30 NOTE — Progress Notes (Signed)
Reviewed Chelsea Mcdaniel's history.  Apparently she was started on coumadin several years ago for DVT and CVA.  Records are not available and this was previously reviewed by Dr. Hilma Favors.  She felt it may be better to continue the coumadin as she was presumably started on it for appropriate reasons,was tolerating it well, and we are not completely sure, if we every will be, that it is safe to stop.  We may revisit this issue further in the future if we can get hold of her old records  (A task to assign someone in the clinic given transformation to a [facility]?).  Agree with Dr. Gladstone Pih assessment and plan regarding anticoagulation management at this time.

## 2010-11-30 NOTE — Progress Notes (Signed)
Anti-Coagulation Progress Note  Chelsea Mcdaniel is a 77 y.o. female who is currently on an anti-coagulation regimen.    RECENT RESULTS: Recent results are below, the most recent result is correlated with a dose of 45 mg. per week: Lab Results  Component Value Date   INR 2.9 11/30/2010   INR 2.3 11/02/2010   INR 2.6 09/28/2010    ANTI-COAG DOSE:   Latest dosing instructions   Total Sun Mon Tue Wed Thu Fri Sat   41.25 7.5 mg 3.75 mg 7.5 mg 3.75 mg 7.5 mg 3.75 mg 7.5 mg    (7.5 mg1) (7.5 mg0.5) (7.5 mg1) (7.5 mg0.5) (7.5 mg1) (7.5 mg0.5) (7.5 mg1)         ANTICOAG SUMMARY: Anticoagulation Episode Summary              Current INR goal 2.0-3.0 Next INR check 01/04/2011   INR from last check 2.9 (11/30/2010)     Weekly max dose (mg)  Target end date Indefinite   Indications Long-term (current) use of anticoagulants, Personal history of DVT (deep vein thrombosis)   INR check location [facility] Preferred lab    Send INR reminders to [facility]   Comments        Provider Role Specialty Phone number   [name]  Internal Medicine [phone]        ANTICOAG TODAY: Anticoagulation Summary as of 11/30/2010              INR goal 2.0-3.0     Selected INR 2.9 (11/30/2010) Next INR check 01/04/2011   Weekly max dose (mg)  Target end date Indefinite   Indications Long-term (current) use of anticoagulants, Personal history of DVT (deep vein thrombosis)    Anticoagulation Episode Summary              INR check location [facility] Preferred lab    Send INR reminders to [facility]   Comments        Provider Role Specialty Phone number   [name]  Internal Medicine [phone]        PATIENT INSTRUCTIONS: Patient Instructions  Patient instructed to take medications as defined in the Anti-coagulation Track section of this encounter.  Patient instructed to take today's dose.  Patient verbalized understanding of these instructions.          FOLLOW-UP Return in 5 weeks (on 01/04/2011) for Follow up INR.  [name], III Pharm.D., CACP

## 2010-12-07 ENCOUNTER — Telehealth: Payer: Self-pay | Admitting: Licensed Clinical Social Worker

## 2010-12-07 NOTE — Telephone Encounter (Signed)
Called agency to let them know patient had appmt on 5/17 and we will be able to complete inhome paperwork at that time.

## 2010-12-09 ENCOUNTER — Ambulatory Visit: Payer: Medicare Other | Admitting: Internal Medicine

## 2010-12-10 ENCOUNTER — Ambulatory Visit (INDEPENDENT_AMBULATORY_CARE_PROVIDER_SITE_OTHER): Payer: Medicare Other | Admitting: Internal Medicine

## 2010-12-10 ENCOUNTER — Encounter: Payer: Self-pay | Admitting: Internal Medicine

## 2010-12-10 ENCOUNTER — Ambulatory Visit: Payer: Self-pay | Admitting: Internal Medicine

## 2010-12-10 VITALS — BP 195/74 | HR 73 | Temp 97.2°F | Ht 64.0 in | Wt 173.0 lb

## 2010-12-10 DIAGNOSIS — E119 Type 2 diabetes mellitus without complications: Secondary | ICD-10-CM

## 2010-12-10 DIAGNOSIS — M1A00X0 Idiopathic chronic gout, unspecified site, without tophus (tophi): Secondary | ICD-10-CM

## 2010-12-10 DIAGNOSIS — I6350 Cerebral infarction due to unspecified occlusion or stenosis of unspecified cerebral artery: Secondary | ICD-10-CM

## 2010-12-10 DIAGNOSIS — I639 Cerebral infarction, unspecified: Secondary | ICD-10-CM | POA: Insufficient documentation

## 2010-12-10 DIAGNOSIS — D6489 Other specified anemias: Secondary | ICD-10-CM

## 2010-12-10 DIAGNOSIS — I10 Essential (primary) hypertension: Secondary | ICD-10-CM

## 2010-12-10 DIAGNOSIS — M1A9XX0 Chronic gout, unspecified, without tophus (tophi): Secondary | ICD-10-CM

## 2010-12-10 DIAGNOSIS — E785 Hyperlipidemia, unspecified: Secondary | ICD-10-CM

## 2010-12-10 LAB — CBC
MCH: 29.7 pg (ref 26.0–34.0)
MCV: 90.3 fL (ref 78.0–100.0)
Platelets: 258 10*3/uL (ref 150–400)
RBC: 3.81 MIL/uL — ABNORMAL LOW (ref 3.87–5.11)
RDW: 12.8 % (ref 11.5–15.5)
WBC: 6.8 10*3/uL (ref 4.0–10.5)

## 2010-12-10 LAB — LIPID PANEL
Cholesterol: 133 mg/dL (ref 0–200)
HDL: 38 mg/dL — ABNORMAL LOW (ref >39)
Total CHOL/HDL Ratio: 3.5 Ratio

## 2010-12-10 MED ORDER — SITAGLIPTIN PHOSPHATE 100 MG PO TABS: 100.0000 mg | ORAL_TABLET | Freq: Every day | ORAL | Status: DC

## 2010-12-10 MED ORDER — SITAGLIPTIN PHOSPHATE 100 MG PO TABS: 50.0000 mg | ORAL_TABLET | Freq: Every day | ORAL | Status: DC

## 2010-12-10 MED ORDER — INDOMETHACIN 25 MG PO CAPS: 25.0000 mg | ORAL_CAPSULE | Freq: Three times a day (TID) | ORAL | Status: DC

## 2010-12-10 MED ORDER — LATANOPROST 0.005 % OP SOLN: 1.0000 [drp] | Freq: Every day | OPHTHALMIC | Status: AC

## 2010-12-10 MED ORDER — BENAZEPRIL HCL 40 MG PO TABS: 40.0000 mg | ORAL_TABLET | Freq: Every day | ORAL | Status: DC

## 2010-12-10 MED ORDER — SIMVASTATIN 40 MG PO TABS: 40.0000 mg | ORAL_TABLET | Freq: Every day | ORAL | Status: DC

## 2010-12-10 MED ORDER — COLCHICINE 0.6 MG PO TABS: 0.6000 mg | ORAL_TABLET | Freq: Two times a day (BID) | ORAL | Status: DC | PRN

## 2010-12-10 MED ORDER — GLIPIZIDE ER 10 MG PO TB24: 10.0000 mg | ORAL_TABLET | Freq: Every day | ORAL | Status: DC

## 2010-12-10 MED ORDER — FUROSEMIDE 40 MG PO TABS: 40.0000 mg | ORAL_TABLET | Freq: Every day | ORAL | Status: DC

## 2010-12-10 MED ORDER — AMLODIPINE BESYLATE 10 MG PO TABS: 10.0000 mg | ORAL_TABLET | Freq: Every day | ORAL | Status: DC

## 2010-12-10 MED ORDER — INDOMETHACIN 25 MG PO CAPS: 25.0000 mg | ORAL_CAPSULE | Freq: Two times a day (BID) | ORAL | Status: AC

## 2010-12-10 NOTE — Progress Notes (Signed)
  Subjective:    Patient ID: Chelsea Mcdaniel, female    DOB: 1927-08-14, 77 y.o.   MRN: [id number]  HPI Chelsea Mcdaniel is here today on routine follow up-Dr Hilma Favors is out today so I am seeing her. She has as her concerns primarily the need to refill her medications-has kept up wth them well, brought all of them with her to clinic, and we reviewed them together today. No issues with weakness, falls, or issues with bruising or bleeding. Remote history of large right frontal/parietal CVA with no new issues, and is currently taking coumadin HbA1C is 7.5 today, which would be more than adequate control given her age and history cerebrovascular disease. She has a good appetite, stable functional status   Review of Systems     Objective:   Physical Exam        Assessment & Plan:  Medications updated today Check CMP, CBC and Lipid profile F/u with Dr Hilma Favors in 3 months.

## 2010-12-11 LAB — COMPLETE METABOLIC PANEL WITH GFR
Albumin: 4.2 g/dL (ref 3.5–5.2)
BUN: 40 mg/dL — ABNORMAL HIGH (ref 6–23)
CO2: 22 mEq/L (ref 19–32)
Calcium: 9.4 mg/dL (ref 8.4–10.5)
Chloride: 101 mEq/L (ref 96–112)
Creat: 1.66 mg/dL — ABNORMAL HIGH (ref 0.40–1.20)
GFR, Est African American: 36 mL/min — ABNORMAL LOW (ref >60)
Potassium: 4.7 mEq/L (ref 3.5–5.3)

## 2010-12-11 NOTE — Discharge Summary (Signed)
[place]  Patient:    Chelsea Mcdaniel, Chelsea Mcdaniel                      MRN: [id number] Adm. Date:  [id number] Disc. Date: [id number] Attending:  Thomes Lolling Dictator:   Oda Kilts, M.D. CC:         Oda Kilts, M.D., [facility]                           Discharge Summary  SERVICE:  Medical teaching service.  DISCHARGE DIAGNOSES: 1. Atypical chest pain of noncardiac origin. 2. Hypertension. 3. Type 2 diabetes mellitus. 4. Hyperlipidemia. 5. Anemia.  DISCHARGE MEDICATIONS: 1. Coumadin 6 mg p.o. q.d. except on Monday and Friday on which days she    takes 6.5 mg p.o. q.d. 2. Lotensin 80 mg p.o. q.d. 3. Hydrochlorothiazide 25 mg p.o. q.d. 4. Glyburide 10 mg p.o. b.i.d. 5. Glucophage 500 mg p.o. b.i.d. 6. Cardizem 240 mg p.o. q.d. 7. Zocor 20 mg p.o. q.d.  CONSULTS:  None.  PROCEDURES:  None.  CHIEF COMPLAINT:  Right-sided chest pain x 1 day.  HISTORY OF PRESENT ILLNESS:  Chelsea Mcdaniel is a 77 year old African-American female with a history of hypertension, diabetes mellitus, obesity, and hypercholesterolemia and prior CVAs who presents complaining of a one-day history of a right-sided chest pain.  The patient reports that she woke up on the morning of admission with pain in the right lateral upper chest that is described as an ache that comes on with movement of the right arm or coughing. The pain is not associated with other physical exertion and subsides when she stops moving her arm or stops coughing.  The pain was present when she woke up at 8:30 a.m. and has been present off and on throughout the day.  She denies associated nausea, vomiting, palpitations, or diaphoresis.  The pain does slightly extend into her right shoulder and supraclavicular neck region.  PAST MEDICAL HISTORY: 1. Hypertension. 2. Type 2 diabetes mellitus with a hemoglobin A1C of 6.8 on September 02, 1999. 3. DJD. 4. 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 5. Status post CVAs in 1985 and 1993.  In 1993, she had CVAs while on aspirin    and Plavix.  She is also status post right CEA.  She denies chronic    anticoagulation.  Her INR was 2.7 on August 21, 1999. 6. Status post TAH-BSO in 1970 secondary to fibroids. 7. Hyperlipidemia with total cholesterol of 236, triglycerides 118, HDL 46,    and LDL of 166 on September 11, 1999. 8. Left lower extremity DVT with left greater saphenous vein thrombus in    1993. 9. 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  ALLERGIES:  No known drug allergies.  MEDICATIONS: 1. Coumadin 6 mg p.o. q.d. except on Mondays and Fridays on which days she    takes 6.5 mg p.o. q.d. 2. Lotensin 80 mg p.o. q.d. 3. Hydrochlorothiazide 25 mg p.o. q.d. 4. Glyburide 10 mg p.o. b.i.d. 5. Glucophage 500 mg p.o. b.i.d. 6. Diltiazem 120 mg p.o. q.d.  SOCIAL HISTORY:  She is a widow.  She has 11 children, 5 of whom are living. She retired in 1993 from [employer].  She denies tobacco, alcohol, or drug use.  FAMILY HISTORY:  Her mom is living  at the age 76 and has a pacemaker.  She is status post BKA bilaterally and has diabetes mellitus.  Her father died at the age of 54 from a ruptured cerebral blood vessel.  She has five brothers who are hypertensive.  Three of her brothers, in their 30s had MIs and coronary artery bypass grafting.  She has five sisters, all of whom have hypertension.  REVIEW OF SYSTEMS:  As per HPI.  Positive for productive cough and chest pain. Negative for shortness of breath, nausea, vomiting, fevers, chills, diarrhea, constipation, myalgias, arthralgias, headaches, weakness, paresthesias, or diaphoresis.  PHYSICAL EXAMINATION:  VITAL SIGNS:  Temperature 99.5, blood pressure 193/82, pulse 73, respiratory rate 13, O2 saturation 97% on room air.  GENERAL: She is a pleasant, jovial, overweight  African-American female lying comfortably in bed.  SKIN:  Warm and dry except for her back which is moist.  NECK:  Supple with a well-healed right CEA scar.  No lymphadenopathy, JVD, or thyromegaly.  HEENT:  Normocephalic, atraumatic.  Right pupil is round and reactive.  The left is irregular secondary to cataract extraction.  Sclerae anicteric. Extraocular movements are intact.  The oropharynx shows an upper plate of dentures.  There is no erythema or exudate.  The nares are patent bilaterally.  LUNGS:  Clear to auscultation bilaterally with good air movement.  CARDIAC:  Regular rhythm and rate.  Normal S1, S2, no murmurs or rubs.  There is an S3.  ABDOMEN:  Soft, obese.  Normoactive bowel sounds.  Nontender with no hepatosplenomegaly appreciated.  EXTREMITIES:  Show 2+ bilateral pulses throughout and no edema.  NEUROLOGIC:  Cranial nerves II-XII grossly intact.  Motor 5/5.  Sensory is intact.  Toes are downgoing bilaterally.  Deep tendon reflexes 2+.  The only exception to her cranial nerve exam is the fact that she is blind in her left eye.  LABORATORY DATA:  Portable chest x-ray shows no acute disease.   She has CEA surgical clips in the right neck.  White blood cell 7.7, hemoglobin 11.1 with MCV of 88, platelet count 296. Sodium 137, potassium 4.2, chloride 107, CO2 25, BUN 23, creatinine 1.1, glucose 136, calcium 8.8, SGOT 26, SGPT 15, alkaline phosphatase 82, total bilirubin 0.4.  PT 19.2, INR 2.1, PTT 33.  Troponin I 0.03, CK 237, MB 1.2. Total protein 7.2, albumin 3.6.  EKG shows normal sinus rhythm with a heart rate of 67.  There is first degree A-V block.  There is left axis deviation.  There are no ST-T changes.  ASSESSMENT:  Chelsea Mcdaniel is a 77 year old African-American female with multiple cardiac risk factors including diabetes, hypertension, obesity, and hyperlipidemia. Although she likely has degree of coronary artery disease, it was not suspected that the  etiology of her chest pain was secondary to  myocardial ischemia.  Her story was very atypical for myocardial ischemia as her pain was associated with movement of the right arm or coughing.  Thus it was suspected that a musculoskeletal origin was likely despite the lack of history of recent trauma or strain.  It was felt that, because of her multiple risk factors, the patient should be admitted to a telemetry bed and ruled out for an acute myocardial infarction with serial enzymes and EKGs.  All of her home medications were to be continued with the exception of Glucophage which was held in case of cardiac catheterization.  Her Cardizem was increased to 240 mg p.o. q.d. to better control her blood pressure.  HOSPITAL COURSE:  #  1 - CHEST PAIN: The patient was admitted to the medicine teaching service under the direction of Dr. Thomes Lolling for rule out of myocardial infarction with serial enzymes and EKGs.  She was not started on heparin or nitroglycerin drip.  At this time, she was also started on Celebrex 2 mg p.o. q.d. for the possibility of treating musculoskeletal pain.  Her first set of CK and MB were 237/1.2.  A repeat CK was 206 with an MB of 1.1.  A repeat EKG showed normal sinus rhythm, left axis deviation, and a heart rate of 63.  It was otherwise unchanged from the day of admission.  Because the patients enzymes were negative and the patients chest pain had resolved, it was felt that she did not require further risk stratification and did not require further workup of the chest pain as it was very suspicious for musculoskeletal chest pain. Thus, the patient was discharged on her previous medications with the exception of the change in her Cardizem to 240 mg p.o. q.d.  She was requested to report any further chest pain, shortness of breath, diaphoresis, nausea, vomiting should these occur.  #2 - HYPERTENSION:  During the hospitalization, the patients Cardizem was increased  from 120 mg p.o. q.d. to 240 mg p.o. q.d.  Her Lotensin and hydrochlorothiazide were continued at her home regimen.  On hospital #2, her blood pressure was 142/68, showing better control with this new dose of Cardizem.  Thus, the patient was discharged at this dose.  #3 - DIABETES MELLITUS:  During the patients hospitalization, Glucophage was on hold pending the need for further cardiac evaluation.  Her glyburide was continued, and the patient maintained good glucose control through her hospitalization.  She was discharged on her previous home regimen.  #4 - HYPERLIPIDEMIA:  The patient has hyperlipidemia and had not been started on a Statin agent as she was due for an hepatic panel in the [facility].  At the time of admission, she had LFTs checked that were normal.  The patient was started on Zocor at a dose of 20 mg p.o. q.d. and was discharged on this medication.  #5 - CHRONIC ANTICOAGULATION:  The patients INR was therapeutic and, thus, the patient was continued on her home regimen of 6 mg p.o. q.d. with the exception of Monday and Friday on which days her dose is 6.5 mg p.o. q.d.  DISPOSITION:  The patient was discharged to home with instructions to report any recurrence of symptoms of shortness of breath, chest pain, nausea or vomiting.  DIET:  The patient is to maintain a low-salt, low-cholesterol, low-sugar diet.  FOLLOWUP:  The patient is to follow up with Dr. Oda Kilts in [facility] as needed and as previously scheduled. DD:  10/27/99 TD:  10/27/99 Job: [id number]

## 2010-12-23 NOTE — Telephone Encounter (Signed)
Faxed on 12/10/10 to [place].

## 2011-01-04 ENCOUNTER — Ambulatory Visit (INDEPENDENT_AMBULATORY_CARE_PROVIDER_SITE_OTHER): Payer: Medicare Other | Admitting: Pharmacist

## 2011-01-04 DIAGNOSIS — I639 Cerebral infarction, unspecified: Secondary | ICD-10-CM

## 2011-01-04 DIAGNOSIS — Z86718 Personal history of other venous thrombosis and embolism: Secondary | ICD-10-CM

## 2011-01-04 DIAGNOSIS — Z7901 Long term (current) use of anticoagulants: Secondary | ICD-10-CM

## 2011-01-04 DIAGNOSIS — I6350 Cerebral infarction due to unspecified occlusion or stenosis of unspecified cerebral artery: Secondary | ICD-10-CM

## 2011-01-04 LAB — POCT INR: INR: 2.2

## 2011-01-04 NOTE — Patient Instructions (Signed)
Patient instructed to take medications as defined in the Anti-coagulation Track section of this encounter.  Patient instructed to take today's dose.  Patient verbalized understanding of these instructions.    

## 2011-01-04 NOTE — Progress Notes (Signed)
Anti-Coagulation Progress Note  Chelsea Mcdaniel is a 77 y.o. female who is currently on an anti-coagulation regimen.    RECENT RESULTS: Recent results are below, the most recent result is correlated with a dose of 41.25 mg. per week: Lab Results  Component Value Date   INR 2.20 01/04/2011   INR 2.9 11/30/2010   INR 2.3 11/02/2010    ANTI-COAG DOSE:   Latest dosing instructions   Total Sun Mon Tue Wed Thu Fri Sat   41.25 7.5 mg 3.75 mg 7.5 mg 3.75 mg 7.5 mg 3.75 mg 7.5 mg    (7.5 mg1) (7.5 mg0.5) (7.5 mg1) (7.5 mg0.5) (7.5 mg1) (7.5 mg0.5) (7.5 mg1)         ANTICOAG SUMMARY: Anticoagulation Episode Summary              Current INR goal 2.0-3.0 Next INR check 02/01/2011   INR from last check 2.20 (01/04/2011)     Weekly max dose (mg)  Target end date Indefinite   Indications Long-term (current) use of anticoagulants, Personal history of DVT (deep vein thrombosis)   [facility]    Send INR reminders to [facility]   Comments        Provider Role Specialty Phone number   [name]  Internal Medicine [phone]        ANTICOAG TODAY: Anticoagulation Summary as of 01/04/2011              INR goal 2.0-3.0     Selected INR 2.20 (01/04/2011) Next INR check 02/01/2011   Weekly max dose (mg)  Target end date Indefinite   Indications Long-term (current) use of anticoagulants, Personal history of DVT (deep vein thrombosis)    Anticoagulation Episode Summary              IN[facility] check [facility] Pref[facility]    Send INR reminders to [facility]   Comments        Provider Role Specialty Phone number   [name]  Internal Medicine [phone]        PATIENT INSTRUCTIONS: Patient Instructions  Patient instructed to take medications as defined in the Anti-coagulation Track section of this encounter.  Patient instructed to take today's dose.  Patient verbalized understanding of these instructions.          FOLLOW-UP Return in 4 weeks (on 02/01/2011) for Follow up INR.  [name], III Pharm.D., CACP

## 2011-01-05 ENCOUNTER — Telehealth: Payer: Self-pay | Admitting: Licensed Clinical Social Worker

## 2011-01-05 NOTE — Telephone Encounter (Signed)
Called patient to let her know I would follow on request for inhome aide which was faxed on 5/17.  Called [facility] and they said they never recd.  Refaxed to Richardson Landry at [place] at [phone] who said he would follow for [place] and call me back.

## 2011-01-12 ENCOUNTER — Other Ambulatory Visit: Payer: Self-pay | Admitting: Internal Medicine

## 2011-02-01 ENCOUNTER — Ambulatory Visit (INDEPENDENT_AMBULATORY_CARE_PROVIDER_SITE_OTHER): Payer: Medicare Other | Admitting: Pharmacist

## 2011-02-01 DIAGNOSIS — Z7901 Long term (current) use of anticoagulants: Secondary | ICD-10-CM

## 2011-02-01 DIAGNOSIS — I639 Cerebral infarction, unspecified: Secondary | ICD-10-CM

## 2011-02-01 DIAGNOSIS — Z86718 Personal history of other venous thrombosis and embolism: Secondary | ICD-10-CM

## 2011-02-01 DIAGNOSIS — I6350 Cerebral infarction due to unspecified occlusion or stenosis of unspecified cerebral artery: Secondary | ICD-10-CM

## 2011-02-01 LAB — POCT INR: INR: 2.5

## 2011-02-01 NOTE — Patient Instructions (Signed)
Patient instructed to take medications as defined in the Anti-coagulation Track section of this encounter.  Patient instructed to take today's dose.  Patient verbalized understanding of these instructions.    

## 2011-02-01 NOTE — Progress Notes (Signed)
Anti-Coagulation Progress Note  Chelsea Mcdaniel is a 77 y.o. female who is currently on an anti-coagulation regimen.    RECENT RESULTS: Recent results are below, the most recent result is correlated with a dose of 41.25 mg. per week: Lab Results  Component Value Date   INR 2.50 02/01/2011   INR 2.20 01/04/2011   INR 2.9 11/30/2010    ANTI-COAG DOSE:   Latest dosing instructions   Total Sun Mon Tue Wed Thu Fri Sat   41.25 7.5 mg 3.75 mg 7.5 mg 3.75 mg 7.5 mg 3.75 mg 7.5 mg    (7.5 mg1) (7.5 mg0.5) (7.5 mg1) (7.5 mg0.5) (7.5 mg1) (7.5 mg0.5) (7.5 mg1)         ANTICOAG SUMMARY: Anticoagulation Episode Summary              Current INR goal 2.0-3.0 Next INR check 02/22/2011   INR from last check 2.50 (02/01/2011)     Weekly max dose (mg)  Target end date Indefinite   Indications Long-term (current) use of anticoagulants, Personal history of DVT (deep vein thrombosis)   IN[facility] check location [facility] Preferred lab    Send INR reminders to [facility]   Comments        Provider Role Specialty Phone number   [name]  Internal Medicine [phone]        ANTICOAG TODAY: Anticoagulation Summary as of 02/01/2011              INR goal 2.0-3.0     Selected INR 2.50 (02/01/2011) Next INR check 02/22/2011   Weekly max dose (mg)  Target end date Indefinite   Indications Long-term (current) use of anticoagulants, Personal history of DVT (deep vein thrombosis)    Anticoagulation Episode Summary              INR check location [facility] Pref[facility]    Send INR reminders to [facility]   Comments        Provider Role Specialty Phone number   [name]  Internal Medicine [phone]        PATIENT INSTRUCTIONS: Patient Instructions  Patient instructed to take medications as defined in the Anti-coagulation Track section of this encounter.  Patient instructed to take today's dose.  Patient verbalized understanding of these instructions.          FOLLOW-UP Return in 3 weeks (on 02/22/2011) for Follow up INR.  [name], III Pharm.D., CACP

## 2011-02-22 ENCOUNTER — Ambulatory Visit (INDEPENDENT_AMBULATORY_CARE_PROVIDER_SITE_OTHER): Payer: Medicare Other | Admitting: Pharmacist

## 2011-02-22 ENCOUNTER — Other Ambulatory Visit: Payer: Self-pay | Admitting: Licensed Clinical Social Worker

## 2011-02-22 ENCOUNTER — Ambulatory Visit: Payer: Medicare Other | Admitting: Licensed Clinical Social Worker

## 2011-02-22 DIAGNOSIS — R2681 Unsteadiness on feet: Secondary | ICD-10-CM

## 2011-02-22 DIAGNOSIS — I639 Cerebral infarction, unspecified: Secondary | ICD-10-CM

## 2011-02-22 DIAGNOSIS — Z7901 Long term (current) use of anticoagulants: Secondary | ICD-10-CM

## 2011-02-22 DIAGNOSIS — I6350 Cerebral infarction due to unspecified occlusion or stenosis of unspecified cerebral artery: Secondary | ICD-10-CM

## 2011-02-22 DIAGNOSIS — Z86718 Personal history of other venous thrombosis and embolism: Secondary | ICD-10-CM

## 2011-02-22 DIAGNOSIS — M25561 Pain in right knee: Secondary | ICD-10-CM

## 2011-02-22 NOTE — Progress Notes (Signed)
Met with patient and daughter in law in conference room.  Patient told me that her agency [place] [facility] has gone out of business and she has been without an aide for three months.  She is needing help with medication mgmt, bathing, dressing.  Patient has blindness in left eye and also low vision in right.  She walks with unsteady gait.   Family and friends have been helping in meantime.   Best phone numbers are [phone] for daughter in law Blakleigh Waychoff and patient phone # is [phone].  Advised patient that I would facilitate referral order to [facility] immediately and that their nurse will call in one to two weeks.  Patient already has agency she would like to use and instructed her to let nurse know at assessment.

## 2011-02-22 NOTE — Progress Notes (Signed)
Anti-Coagulation Progress Note  Chelsea Mcdaniel is a 77 y.o. female who is currently on an anti-coagulation regimen.    RECENT RESULTS: Recent results are below, the most recent result is correlated with a dose of 41.25 mg. per week: Lab Results  Component Value Date   INR 2.0 02/22/2011   INR 2.50 02/01/2011   INR 2.20 01/04/2011    ANTI-COAG DOSE:   Latest dosing instructions   Total Sun Mon Tue Wed Thu Fri Sat   45 7.5 mg 7.5 mg 7.5 mg 3.75 mg 7.5 mg 3.75 mg 7.5 mg    (7.5 mg1) (7.5 mg1) (7.5 mg1) (7.5 mg0.5) (7.5 mg1) (7.5 mg0.5) (7.5 mg1)         ANTICOAG SUMMARY: Anticoagulation Episode Summary              Current INR goal 2.0-3.0 Next INR check 03/22/2011   INR from last check 2.0 (02/22/2011)     Weekly max dose (mg)  Target end date Indefinite   Indications Long-term (current) use of anticoagulants, Personal history of DVT (deep vein thrombosis)   [facility]    Send INR reminders to [facility]   Comments        Provider Role Specialty Phone number   [name]  Internal Medicine [phone]        ANTICOAG TODAY: Anticoagulation Summary as of 02/22/2011              INR goal 2.0-3.0     Selected INR 2.0 (02/22/2011) Next INR check 03/22/2011   Weekly max dose (mg)  Target end date Indefinite   Indications Long-term (current) use of anticoagulants, Personal history of DVT (deep vein thrombosis)    Anticoagulation Episode Summary              IN[facility]    Send INR reminders to [facility]   Comments        Provider Role Specialty Phone number   [name]  Internal Medicine [phone]        PATIENT INSTRUCTIONS: Patient Instructions  Patient instructed to take medications as defined in the Anti-coagulation Track section of this encounter.  Patient instructed to take today's dose.  Patient verbalized understanding of these instructions.          FOLLOW-UP Return in 4 weeks (on 03/22/2011) for Follow up INR.  [name], III Pharm.D., CACP

## 2011-02-22 NOTE — Patient Instructions (Signed)
Patient instructed to take medications as defined in the Anti-coagulation Track section of this encounter.  Patient instructed to take today's dose.  Patient verbalized understanding of these instructions.    

## 2011-02-22 NOTE — Progress Notes (Signed)
Order for inhome aide signed by attending Dr. Nance Pew and faxed this PM.

## 2011-03-01 ENCOUNTER — Telehealth: Payer: Self-pay | Admitting: Licensed Clinical Social Worker

## 2011-03-01 NOTE — Telephone Encounter (Signed)
Checked on [facility] referral for inhome aide.  Lennette Bihari said assessment had already been done and patient had chosen agency-[facility].

## 2011-03-22 ENCOUNTER — Ambulatory Visit (INDEPENDENT_AMBULATORY_CARE_PROVIDER_SITE_OTHER): Payer: Medicare Other | Admitting: Pharmacist

## 2011-03-22 DIAGNOSIS — Z86718 Personal history of other venous thrombosis and embolism: Secondary | ICD-10-CM

## 2011-03-22 DIAGNOSIS — Z7901 Long term (current) use of anticoagulants: Secondary | ICD-10-CM

## 2011-03-22 DIAGNOSIS — I639 Cerebral infarction, unspecified: Secondary | ICD-10-CM

## 2011-03-22 DIAGNOSIS — I6350 Cerebral infarction due to unspecified occlusion or stenosis of unspecified cerebral artery: Secondary | ICD-10-CM

## 2011-03-22 LAB — POCT INR: INR: 2.3

## 2011-03-22 NOTE — Patient Instructions (Signed)
Patient instructed to take medications as defined in the Anti-coagulation Track section of this encounter.  Patient instructed to take today's dose.  Patient verbalized understanding of these instructions.    

## 2011-03-22 NOTE — Progress Notes (Signed)
Anti-Coagulation Progress Note  Chelsea Mcdaniel is a 77 y.o. female who is currently on an anti-coagulation regimen.    RECENT RESULTS: Recent results are below, the most recent result is correlated with a dose of 45 mg. per week: Lab Results  Component Value Date   INR 2.30 03/22/2011   INR 2.0 02/22/2011   INR 2.50 02/01/2011    ANTI-COAG DOSE:   Latest dosing instructions   Total Sun Mon Tue Wed Thu Fri Sat   45 7.5 mg 7.5 mg 7.5 mg 3.75 mg 7.5 mg 7.5 mg 3.75 mg    (7.5 mg1) (7.5 mg1) (7.5 mg1) (7.5 mg0.5) (7.5 mg1) (7.5 mg1) (7.5 mg0.5)         ANTICOAG SUMMARY: Anticoagulation Episode Summary              Current INR goal 2.0-3.0 Next INR check 04/19/2011   INR from last check 2.30 (03/22/2011)     Weekly max dose (mg)  Target end date Indefinite   Indications Long-term (current) use of anticoagulants, Personal history of DVT (deep vein thrombosis)   [facility]    Send INR reminders to [facility]   Comments        Provider Role Specialty Phone number   [name]  Internal Medicine [phone]        ANTICOAG TODAY: Anticoagulation Summary as of 03/22/2011              INR goal 2.0-3.0     Selected INR 2.30 (03/22/2011) Next INR check 04/19/2011   Weekly max dose (mg)  Target end date Indefinite   Indications Long-term (current) use of anticoagulants, Personal history of DVT (deep vein thrombosis)    Anticoagulation Episode Summary              INR check location [facility] Preferred lab    Send INR reminders to [facility]   Comments        Provider Role Specialty Phone number   [name]  Internal Medicine [phone]        PATIENT INSTRUCTIONS: Patient Instructions  Patient instructed to take medications as defined in the Anti-coagulation Track section of this encounter.  Patient instructed to take today's dose.  Patient verbalized understanding of these instructions.          FOLLOW-UP Return in about 4 weeks (around 04/19/2011) for Follow up INR.  [name], III Pharm.D., CACP

## 2011-03-22 NOTE — Progress Notes (Signed)
Agree with the above.  Apparently, long term anticoagulation is the default posture as specifics of her event are very remote and unclear.

## 2011-03-31 ENCOUNTER — Encounter: Payer: Medicare Other | Admitting: Internal Medicine

## 2011-04-07 ENCOUNTER — Encounter: Payer: Self-pay | Admitting: Internal Medicine

## 2011-04-07 ENCOUNTER — Ambulatory Visit (INDEPENDENT_AMBULATORY_CARE_PROVIDER_SITE_OTHER): Payer: Medicare Other | Admitting: Internal Medicine

## 2011-04-07 VITALS — BP 162/78 | HR 71 | Temp 98.6°F | Wt 169.4 lb

## 2011-04-07 DIAGNOSIS — M25569 Pain in unspecified knee: Secondary | ICD-10-CM

## 2011-04-07 DIAGNOSIS — D6489 Other specified anemias: Secondary | ICD-10-CM

## 2011-04-07 DIAGNOSIS — E785 Hyperlipidemia, unspecified: Secondary | ICD-10-CM

## 2011-04-07 DIAGNOSIS — E119 Type 2 diabetes mellitus without complications: Secondary | ICD-10-CM

## 2011-04-07 DIAGNOSIS — I6350 Cerebral infarction due to unspecified occlusion or stenosis of unspecified cerebral artery: Secondary | ICD-10-CM

## 2011-04-07 DIAGNOSIS — I10 Essential (primary) hypertension: Secondary | ICD-10-CM

## 2011-04-07 LAB — GLUCOSE, CAPILLARY: Glucose-Capillary: 269 mg/dL — ABNORMAL HIGH (ref 70–99)

## 2011-04-07 NOTE — Progress Notes (Signed)
  Subjective:    Patient ID: Chelsea Mcdaniel, female    DOB: 1928-01-05, 77 y.o.   MRN: [id number]  HPI Patient is a 77 yo female with history of DM, HLD, anemia, HTN & CVA that presents today for DM check up.  She currently has no complaints.    She denies having symptoms of hypoglycemia such as sweats/palpitations/dizziness, although she does feel lightheaded if she sleeps in.  The lightheadedness resolves after eating.  She denies symptoms of hyperglycemia such as polyuria and polydipsia.    She also denies feelings of SOB, CP, tinnitus, fatigue, or joint pain.   She does endorse some mild numbness of her fingers that impedes her fine motor function, but she attributes this to finger sticks and notes that it is not an overly bothersome issue.  She also endorses occasional constipation but does not want any treatment at this time.  Review of Systems General: no fevers, chills, changes in weight, changes in appetite Skin: no rash HEENT: no vision in left eye, right eye has cataract (chronic problems s/p stroke), no hearing changes, no sore throat Pulm: no dyspnea, coughing, wheezing CV: no chest pain, palpitations, shortness of breath Abd: no abdominal pain, nausea/vomiting, diarrhea GU: no dysuria, hematuria, polyuria Ext: no arthralgias, myalgias Neuro: no weakness or tingling    Objective:   Physical Exam  Vitals: reviewd General: sitting comfortably in chair, well nourished  HEENT: no scleral icterus, no conjunctival pallor  Cardiac: RRR, no rubs, murmurs or gallops Pulm: clear to auscultation bilaterally, moving normal volumes of air, no wheezing/rales/rhonchi Abd: soft, nontender, nondistended, BS normoactive Ext: warm and well perfused, no pedal edema bilaterally Neuro: alert and oriented X3, strength and sensation to light touch equal in bilateral upper and lower extremities, normal gait & cerebellar function     Assessment & Plan:  Case and care discussed with Dr.  Nance Pew. Please see problem oriented charting for further detail.

## 2011-04-07 NOTE — Assessment & Plan Note (Signed)
Patient currently denies symptoms of anemia & does not want to pursue a colonoscopy at this point.  Plan: If patient becomes symptomatic, we will pursue further work up.

## 2011-04-07 NOTE — Assessment & Plan Note (Signed)
Blood pressure elevated today, but she notes that she has not taken her medications today since she was worried about getting her things together to come to doctor.  She denies symptoms of HA/dizziness/CP.  Echo in 2008 suggests abnormalities with LV relaxation, but EF was 55-60%.  Patient reports history of pedal edema that improved after lasix initiation.   Plan: Monitor BP and continue current regimen of Amlodipine, Benazepril & lasix.

## 2011-04-07 NOTE — Assessment & Plan Note (Signed)
Labs reviewed at this visit from May 2012.  Cholesterol/Lipids are at goal.  Plan: continue statin therapy.

## 2011-04-07 NOTE — Assessment & Plan Note (Addendum)
HbA1c is elevated today at 8.4.  Patient is compliant with medications.  No new complaints.  Given that risk of hypoglycemia is a greater risk in someone of her age group, I will not alter her anti-glycemic regimen at this time.  Plan: Continue current diabetes regimen of januvia & glipizide. Eye exam scheduled for May 24, 2011- she will have records forwarded to [place]. Lipid panal was drawn at last visit in May and is WNL. Foot exam was done today. Patient refused flu, pneumovax & tetanus vaccinations at this time. Patient is not a smoker. There is no role for checking urine microalbumin at this time given that patient has mild baseline chronic renal insufficiency and she is already on an ACE inhibitor, which we will continue.

## 2011-04-07 NOTE — Assessment & Plan Note (Signed)
Denies knee pain today.  No active issues.

## 2011-04-07 NOTE — Assessment & Plan Note (Signed)
No new problems.  Patient is currently on chronic anticoagulation and is being seen at our coumadin clinic.  Her next appointment is 04/19/11.  Plan: Clarify reason for coumadin vs ASA anticoagulation given patient's age.

## 2011-04-07 NOTE — Patient Instructions (Signed)
-  Great job taking all of your medications!   -Please continue all of your current medications.  If you experience any new problems or symptoms, please call the clinic at [phone].  -Please return in 3 months for a diabetes follow up.  -Please have the results from your eye exam sent to [place] after your appointment in October.

## 2011-04-08 NOTE — Progress Notes (Signed)
I agree with assessment and plan as per Dr. Kapadia. 

## 2011-04-19 ENCOUNTER — Ambulatory Visit: Payer: Medicare Other

## 2011-05-03 ENCOUNTER — Ambulatory Visit (INDEPENDENT_AMBULATORY_CARE_PROVIDER_SITE_OTHER): Payer: Medicare Other | Admitting: Pharmacist

## 2011-05-03 ENCOUNTER — Other Ambulatory Visit: Payer: Self-pay | Admitting: Internal Medicine

## 2011-05-03 DIAGNOSIS — I639 Cerebral infarction, unspecified: Secondary | ICD-10-CM

## 2011-05-03 DIAGNOSIS — I6350 Cerebral infarction due to unspecified occlusion or stenosis of unspecified cerebral artery: Secondary | ICD-10-CM

## 2011-05-03 DIAGNOSIS — Z7901 Long term (current) use of anticoagulants: Secondary | ICD-10-CM

## 2011-05-03 DIAGNOSIS — Z86718 Personal history of other venous thrombosis and embolism: Secondary | ICD-10-CM

## 2011-05-03 MED ORDER — ACCU-CHEK ADVANTAGE DIABETES KIT: PACK | Status: AC

## 2011-05-03 MED ORDER — ACCU-CHEK SOFT TOUCH LANCETS MISC: Status: DC

## 2011-05-03 MED ORDER — GLUCOSE BLOOD VI STRP: ORAL_STRIP | Status: AC

## 2011-05-03 NOTE — Patient Instructions (Signed)
Patient instructed to take medications as defined in the Anti-coagulation Track section of this encounter.  Patient instructed to take today's dose.  Patient verbalized understanding of these instructions.    

## 2011-05-03 NOTE — Progress Notes (Signed)
Anti-Coagulation Progress Note  Chelsea Mcdaniel is a 77 y.o. female who is currently on an anti-coagulation regimen.    RECENT RESULTS: Recent results are below, the most recent result is correlated with a dose of 45 mg. per week: Lab Results  Component Value Date   INR 3.40 05/03/2011   INR 2.30 03/22/2011   INR 2.0 02/22/2011    ANTI-COAG DOSE:   Latest dosing instructions   Total Sun Mon Tue Wed Thu Fri Sat   41.25 7.5 mg 3.75 mg 7.5 mg 3.75 mg 7.5 mg 3.75 mg 7.5 mg    (7.5 mg1) (7.5 mg0.5) (7.5 mg1) (7.5 mg0.5) (7.5 mg1) (7.5 mg0.5) (7.5 mg1)         ANTICOAG SUMMARY: Anticoagulation Episode Summary              Current INR goal 2.0-3.0 Next INR check 05/24/2011   INR from last check 3.40! (05/03/2011)     Weekly max dose (mg)  Target end date Indefinite   Indications Long-term (current) use of anticoagulants, Personal history of DVT (deep vein thrombosis)   [facility] check [facility]    Send INR reminders to [facility]   Comments        Provider Role Specialty Phone number   [name]  Internal Medicine [phone]        ANTICOAG TODAY: Anticoagulation Summary as of 05/03/2011              INR goal 2.0-3.0     Selected INR 3.40! (05/03/2011) Next INR check 05/24/2011   Weekly max dose (mg)  Target end date Indefinite   Indications Long-term (current) use of anticoagulants, Personal history of DVT (deep vein thrombosis)    Anticoagulation Episode Summary              INR check location [facility] Preferred lab    Send INR reminders to [facility]   Comments        Provider Role Specialty Phone number   [name]  Internal Medicine [phone]        PATIENT INSTRUCTIONS: Patient Instructions  Patient instructed to take medications as defined in the Anti-coagulation Track section of this encounter.  Patient instructed to take today's dose.  Patient verbalized understanding of these instructions.          FOLLOW-UP Return in 3 weeks (on 05/24/2011) for Follow up INR.  [name], III Pharm.D., CACP

## 2011-05-24 ENCOUNTER — Ambulatory Visit (INDEPENDENT_AMBULATORY_CARE_PROVIDER_SITE_OTHER): Payer: Medicare Other | Admitting: Pharmacist

## 2011-05-24 DIAGNOSIS — Z7901 Long term (current) use of anticoagulants: Secondary | ICD-10-CM

## 2011-05-24 DIAGNOSIS — Z86718 Personal history of other venous thrombosis and embolism: Secondary | ICD-10-CM

## 2011-05-24 DIAGNOSIS — I639 Cerebral infarction, unspecified: Secondary | ICD-10-CM

## 2011-05-24 DIAGNOSIS — I6350 Cerebral infarction due to unspecified occlusion or stenosis of unspecified cerebral artery: Secondary | ICD-10-CM

## 2011-05-24 NOTE — Patient Instructions (Signed)
Patient instructed to take medications as defined in the Anti-coagulation Track section of this encounter.  Patient instructed to take today's dose.  Patient verbalized understanding of these instructions.    

## 2011-05-24 NOTE — Progress Notes (Signed)
Anti-Coagulation Progress Note  Chelsea Mcdaniel is a 77 y.o. female who is currently on an anti-coagulation regimen.    RECENT RESULTS: Recent results are below, the most recent result is correlated with a dose of 41.25 mg. per week: Lab Results  Component Value Date   INR 2.40 05/24/2011   INR 3.40 05/03/2011   INR 2.30 03/22/2011    ANTI-COAG DOSE:   Latest dosing instructions   Total Sun Mon Tue Wed Thu Fri Sat   41.25 7.5 mg 3.75 mg 7.5 mg 3.75 mg 7.5 mg 3.75 mg 7.5 mg    (7.5 mg1) (7.5 mg0.5) (7.5 mg1) (7.5 mg0.5) (7.5 mg1) (7.5 mg0.5) (7.5 mg1)         ANTICOAG SUMMARY: Anticoagulation Episode Summary              Current INR goal 2.0-3.0 Next INR check 06/21/2011   INR from last check 2.40 (05/24/2011)     Weekly max dose (mg)  Target end date Indefinite   Indications Long-term (current) use of anticoagulants, Personal history of DVT (deep vein thrombosis)   INR check location [facility] Preferred lab    Send INR reminders to [facility]   Comments        Provider Role Specialty Phone number   [name]  Internal Medicine [phone]        ANTICOAG TODAY: Anticoagulation Summary as of 05/24/2011              INR goal 2.0-3.0     Selected INR 2.40 (05/24/2011) Next INR check 06/21/2011   Weekly max dose (mg)  Target end date Indefinite   Indications Long-term (current) use of anticoagulants, Personal history of DVT (deep vein thrombosis)    Anticoagulation Episode Summary              INR check location [facility] lab    Send INR reminders to [facility]   Comments        Provider Role Specialty Phone number   [name]  Internal Medicine [phone]        PATIENT INSTRUCTIONS: Patient Instructions  Patient instructed to take medications as defined in the Anti-coagulation Track section of this encounter.  Patient instructed to take today's dose.  Patient verbalized understanding of these instructions.         FOLLOW-UP Return in 4 weeks (on 06/21/2011) for Follow up INR.  [name], III Pharm.D., CACP

## 2011-06-21 ENCOUNTER — Ambulatory Visit (INDEPENDENT_AMBULATORY_CARE_PROVIDER_SITE_OTHER): Payer: Medicare Other | Admitting: Pharmacist

## 2011-06-21 DIAGNOSIS — I6350 Cerebral infarction due to unspecified occlusion or stenosis of unspecified cerebral artery: Secondary | ICD-10-CM

## 2011-06-21 DIAGNOSIS — I639 Cerebral infarction, unspecified: Secondary | ICD-10-CM

## 2011-06-21 DIAGNOSIS — Z86718 Personal history of other venous thrombosis and embolism: Secondary | ICD-10-CM

## 2011-06-21 DIAGNOSIS — Z7901 Long term (current) use of anticoagulants: Secondary | ICD-10-CM

## 2011-06-21 LAB — POCT INR: INR: 2.1

## 2011-06-21 MED ORDER — WARFARIN SODIUM 7.5 MG PO TABS: ORAL_TABLET | ORAL | Status: DC

## 2011-06-21 NOTE — Progress Notes (Signed)
Anti-Coagulation Progress Note  Chelsea Mcdaniel is a 77 y.o. female who is currently on an anti-coagulation regimen.    RECENT RESULTS: Recent results are below, the most recent result is correlated with a dose of 41.25 mg. per week: Lab Results  Component Value Date   INR 2.1 06/21/2011   INR 2.40 05/24/2011   INR 3.40 05/03/2011    ANTI-COAG DOSE:   Latest dosing instructions   Total Sun Mon Tue Wed Thu Fri Sat   48.75 7.5 mg 7.5 mg 7.5 mg 3.75 mg 7.5 mg 7.5 mg 7.5 mg    (7.5 mg1) (7.5 mg1) (7.5 mg1) (7.5 mg0.5) (7.5 mg1) (7.5 mg1) (7.5 mg1)         ANTICOAG SUMMARY: Anticoagulation Episode Summary              Current INR goal 2.0-3.0 Next INR check 07/12/2011   INR from last check 2.1 (06/21/2011)     Weekly max dose (mg)  Target end date Indefinite   Indications Long-term (current) use of anticoagulants, Personal history of DVT (deep vein thrombosis)   IN[facility] check location [facility] Pref[facility]    Send INR reminders to [facility] IMP   Comments        Provider Role Specialty Phone number   [name]  Internal Medicine [phone]        ANTICOAG TODAY: Anticoagulation Summary as of 06/21/2011              INR goal 2.0-3.0     Selected INR 2.1 (06/21/2011) Next INR check 07/12/2011   Weekly max dose (mg)  Target end date Indefinite   Indications Long-term (current) use of anticoagulants, Personal history of DVT (deep vein thrombosis)    Anticoagulation Episode Summary              INR check location [facility] Pref[facility] lab    Send INR reminders to [facility]   Comments        Provider Role Specialty Phone number   [name]  Internal Medicine [phone]        PATIENT INSTRUCTIONS: Patient Instructions  Patient instructed to take medications as defined in the Anti-coagulation Track section of this encounter.  Patient instructed to take today's dose.  Patient verbalized understanding of these instructions.    Patient instructed to take 1 tablet by mouth on Sundays; Mondays; Tuesdays; Thursdays; Fridays and Saturdays; on Wednesday of each week--take ONE-HALF (1/2) tablet.       FOLLOW-UP Return in 3 weeks (on 07/12/2011) for Follow up INR.  [name], III Pharm.D., CACP

## 2011-06-21 NOTE — Patient Instructions (Signed)
Patient instructed to take medications as defined in the Anti-coagulation Track section of this encounter.  Patient instructed to take today's dose.  Patient verbalized understanding of these instructions.  Patient instructed to take 1 tablet by mouth on Sundays; Mondays; Tuesdays; Thursdays; Fridays and Saturdays; on Wednesday of each week--take ONE-HALF (1/2) tablet.

## 2011-06-22 NOTE — Progress Notes (Signed)
RX CALLED TO PHARMACY  

## 2011-06-22 NOTE — Progress Notes (Signed)
E-PRESCRIBE WAS DOWN YESTERDAY; WARFARIN RX CALLED TO [facility].

## 2011-07-12 ENCOUNTER — Ambulatory Visit (INDEPENDENT_AMBULATORY_CARE_PROVIDER_SITE_OTHER): Payer: Medicare Other | Admitting: Pharmacist

## 2011-07-12 DIAGNOSIS — I639 Cerebral infarction, unspecified: Secondary | ICD-10-CM

## 2011-07-12 DIAGNOSIS — Z86718 Personal history of other venous thrombosis and embolism: Secondary | ICD-10-CM

## 2011-07-12 DIAGNOSIS — Z7901 Long term (current) use of anticoagulants: Secondary | ICD-10-CM

## 2011-07-12 DIAGNOSIS — I6350 Cerebral infarction due to unspecified occlusion or stenosis of unspecified cerebral artery: Secondary | ICD-10-CM

## 2011-07-12 NOTE — Progress Notes (Signed)
Anti-Coagulation Progress Note  Chelsea Mcdaniel is a 77 y.o. female who is currently on an anti-coagulation regimen.    RECENT RESULTS: Recent results are below, the most recent result is correlated with a dose of 41.25 mg. per week: Lab Results  Component Value Date   INR 4.3 07/12/2011   INR 2.1 06/21/2011   INR 2.40 05/24/2011    ANTI-COAG DOSE:   Latest dosing instructions   Total Sun Mon Tue Wed Thu Fri Sat   37.5 7.5 mg 3.75 mg 3.75 mg 3.75 mg 7.5 mg 3.75 mg 7.5 mg    (7.5 mg1) (7.5 mg0.5) (7.5 mg0.5) (7.5 mg0.5) (7.5 mg1) (7.5 mg0.5) (7.5 mg1)         ANTICOAG SUMMARY: Anticoagulation Episode Summary              Current INR goal 2.0-3.0 Next INR check 08/02/2011   INR from last check 4.3! (07/12/2011)     Weekly max dose (mg)  Target end date Indefinite   Indications Long-term (current) use of anticoagulants, Personal history of DVT (deep vein thrombosis)   INR check location [facility] Pref[facility]    Send INR reminders to [facility]   Comments        Provider Role Specialty Phone number   [name]  Internal Medicine [phone]        ANTICOAG TODAY: Anticoagulation Summary as of 07/12/2011              INR goal 2.0-3.0     Selected INR 4.3! (07/12/2011) Next INR check 08/02/2011   Weekly max dose (mg)  Target end date Indefinite   Indications Long-term (current) use of anticoagulants, Personal history of DVT (deep vein thrombosis)    Anticoagulation Episode Summary              IN[facility] check [facility] Pref[facility]    Send INR reminders to [facility]   Comments        Provider Role Specialty Phone number   [name]  Internal Medicine [phone]        PATIENT INSTRUCTIONS: Patient Instructions  Patient instructed to take medications as defined in the Anti-coagulation Track section of this encounter.  Patient instructed to take today's dose.  Patient verbalized understanding of these instructions.          FOLLOW-UP Return in 3 weeks (on 08/02/2011) for Follow up INR.  [name], III Pharm.D., CACP

## 2011-07-12 NOTE — Patient Instructions (Signed)
Patient instructed to take medications as defined in the Anti-coagulation Track section of this encounter.  Patient instructed to take today's dose.  Patient verbalized understanding of these instructions.    

## 2011-07-28 ENCOUNTER — Ambulatory Visit (INDEPENDENT_AMBULATORY_CARE_PROVIDER_SITE_OTHER): Payer: Medicare Other | Admitting: Internal Medicine

## 2011-07-28 ENCOUNTER — Encounter: Payer: Self-pay | Admitting: Internal Medicine

## 2011-07-28 VITALS — BP 166/84 | HR 79 | Temp 97.1°F | Wt 173.7 lb

## 2011-07-28 DIAGNOSIS — Z86718 Personal history of other venous thrombosis and embolism: Secondary | ICD-10-CM

## 2011-07-28 DIAGNOSIS — M25569 Pain in unspecified knee: Secondary | ICD-10-CM

## 2011-07-28 DIAGNOSIS — I10 Essential (primary) hypertension: Secondary | ICD-10-CM

## 2011-07-28 DIAGNOSIS — E785 Hyperlipidemia, unspecified: Secondary | ICD-10-CM

## 2011-07-28 DIAGNOSIS — E119 Type 2 diabetes mellitus without complications: Secondary | ICD-10-CM

## 2011-07-28 DIAGNOSIS — M25561 Pain in right knee: Secondary | ICD-10-CM

## 2011-07-28 LAB — GLUCOSE, CAPILLARY: Glucose-Capillary: 240 mg/dL — ABNORMAL HIGH (ref 70–99)

## 2011-07-28 MED ORDER — DICLOFENAC SODIUM 1 % TD GEL: 1.0000 "application " | Freq: Two times a day (BID) | TRANSDERMAL | Status: DC

## 2011-07-28 NOTE — Progress Notes (Addendum)
I discussed patient's care with resident Dr. Victorio Palm.  I agree with the clinical findings and plans as outlined in her note.

## 2011-07-28 NOTE — Patient Instructions (Addendum)
-  Great job taking all of your medications!   -Please be sure to schedule a visit with Butch Penny Plyler to help with your diet.  Your sugar is a little higher than we would like, so let's try to change your diet.  -Continue monitoring your blood pressure at home and bring the diary with you.  -Please continue all of your current medications. If you experience any new problems or symptoms, please call the clinic at [phone].   -Please return in 6-8 weeks for a hypertension follow up and in 3 months for a diabetes follow up.   -Please be sure to visit your eye doctor as they recommended.   -At your next appointment, we will discuss any role for discontinuing warfarin.

## 2011-07-28 NOTE — Assessment & Plan Note (Signed)
Lab Results  Component Value Date   NA 136 12/10/2010   K 4.7 12/10/2010   CL 101 12/10/2010   CO2 22 12/10/2010   BUN 40* 12/10/2010   CREATININE 1.66* 12/10/2010   CREATININE 1.99* 04/16/2010    BP Readings from Last 3 Encounters:  07/28/11 166/84  04/07/11 162/78  12/10/10 195/74    Assessment: Hypertension control:  moderately elevated  Progress toward goals:  unchanged Barriers to meeting goals:  no barriers identified  Plan: Hypertension treatment:  continue current medications; Patient has a log of her BP measurements from home (which her RN aid takes on MWF).  Some BPs are significantly above gao (SBP=170s), but majority are within goal (SBP 100-130), with even a few below goal (90/60 this AM).  Because of these values, I am hesitant to start another antihypertensive at this point.  I asked her to continue her diary, and return in 6-8 weeks for BP f/u.  I suspect she may have a component of white coat HTN, but of note, she tells me that the monitor used by her RN aid is one that checks BP at the wrist.

## 2011-07-28 NOTE — Assessment & Plan Note (Signed)
Stable, pt taking simvastatin as directed.  Will check CMET to assess liver fxn today.

## 2011-07-28 NOTE — Progress Notes (Signed)
Subjective:   Patient ID: Chelsea Mcdaniel female   DOB: 10/01/1927 78 y.o.   MRN: [id number]  HPI: [name] is a 78 y.o. woman who presents for DM follow up.  She has no complaints today.  DM: -RN aid checks CBGs MWF before breakfast and around 4pm; Glucometer-Contour -No low CBGs; denies HA, dizziness, palpitations, chills, polyuria, polydipsia, burning of feet -Blurry vision in right eye occasionally, but she has cataract in this eye -Good appetite with dietary indiscretions (Thanksgiving/Christmas) -Has not been attending silver sneakers for exercise because of transportation issues  HTN: -RN aid checks BP MWF.  She brought log with her today.  Highest SBP = 178 over last 4-5 months.  Lowest SBP = 90.  Patient has had several normotensive readings.  Will have copy of diary scanned into system.    Chronic Anticoagulation: Patient recalls h/o DVT about 20 years ago and has been on coumadin since.  She would like to discontinue if possible.   B/l Knee Pain: Worse with standing, not so bad that she wants pain killers.  Voltaryn gel relieves pain.  Of note, she has recently had a cough (around Christmas) that has since resolved.  She used diabetic tussin during the illness.  She did not have fever.   Past Medical History  Diagnosis Date  . Diabetes mellitus   . Hyperlipidemia   . Hypertension   . CVA (cerebrovascular accident)  October 2007     CT of the head atrophy with multiple remote insults noted but no definite acute findings  . Blindness of left eye      likely related to stroke, left cataract removed from that eye with complications  . Anemia      normocytic anemia with baseline hemoglobin 10-11  . Chronic kidney disease 2006     left renal artery stenosis with probable hemodynamic significance, kidneys are normal in morphology without focal lesions or hydronephrosis this is based but cannot A. of the abdomen with and without contrast done on the 31st 2006    Current Outpatient Prescriptions  Medication Sig Dispense Refill  . amLODipine (NORVASC) 10 MG tablet Take 1 tablet (10 mg total) by mouth daily. For blood pressure  30 tablet  11  . benazepril (LOTENSIN) 40 MG tablet Take 1 tablet (40 mg total) by mouth daily.  90 tablet  3  . Blood Glucose Monitoring Suppl (ACCU-CHEK ADVANTAGE DIABETES) kit Use as instructed  1 each  0  . Calcium-Vitamin D (CHEWABLE CALCIUM/D) 300 MG CHEW Chew by mouth. Take as directed       . diclofenac sodium (VOLTAREN) 1 % GEL Apply 1 application topically 2 (two) times daily. To knees  1 Tube  5  . furosemide (LASIX) 40 MG tablet Take 1 tablet (40 mg total) by mouth daily.  30 tablet  11  . glipiZIDE (GLUCOTROL) 10 MG 24 hr tablet Take 1 tablet (10 mg total) by mouth daily.  30 tablet  11  . glucose blood (ACCU-CHEK AVIVA) test strip Use as instructed  100 each  12  . Lancets (ACCU-CHEK SOFT TOUCH) lancets Use as instructed  100 each  12  . latanoprost (XALATAN) 0.005 % ophthalmic solution Place 1 drop into both eyes at bedtime. Prescribed by Optho  2.5 mL  11  . simvastatin (ZOCOR) 40 MG tablet Take 1 tablet (40 mg total) by mouth at bedtime.  30 tablet  11  . sitaGLIPtan (JANUVIA) 100 MG tablet Take 0.5 tablets (50 mg  total) by mouth daily.  15 tablet  11  . warfarin (COUMADIN) 7.5 MG tablet Take as directed by anticoagulation clinic provider.  60 tablet  11  . colchicine (COLCRYS) 0.6 MG tablet Take 1 tablet (0.6 mg total) by mouth 2 (two) times daily as needed. For gout flare  30 tablet  11  . indomethacin (INDOCIN) 25 MG capsule Take 1 capsule (25 mg total) by mouth 3 (three) times daily. For 5 days  30 capsule  11   Family History  Problem Relation Age of Onset  . Heart disease Mother   . Diabetes Mother   . Hyperlipidemia Mother   . Hypertension Mother   . Heart disease Father   . Diabetes Father   . Hyperlipidemia Father   . Hypertension Father    History   Social History  . Marital Status: Widowed     Spouse Name: N/A    Number of Children: N/A  . Years of Education: N/A   Social History Main Topics  . Smoking status: Never Smoker   . Smokeless tobacco: None  . Alcohol Use: No  . Drug Use: No  . Sexually Active: None   Other Topics Concern  . None   Social History Narrative    Patient is a widow. She has 11 children 5 of who are living. She is retired in 1993 from [employer]. She denies tobacco alcohol or drug use.   Review of Systems: Constitutional: Denies fever, chills, diaphoresis, appetite change and fatigue.  HEENT: Denies photophobia, eye pain, redness, hearing loss, ear pain and tinnitus.   Respiratory: Denies SOB, DOE, cough, chest tightness,  and wheezing.   Cardiovascular: Denies chest pain, palpitations and leg swelling.  Gastrointestinal: Denies nausea, vomiting, abdominal pain, diarrhea, constipation, blood in stool and abdominal distention.  Genitourinary: Denies dysuria, urgency, frequency, hematuria, flank pain and difficulty urinating.  Musculoskeletal: Denies myalgias, back pain, and gait problem.  Skin: Denies pallor, rash and wound.  Neurological: Denies dizziness, seizures, syncope, weakness, light-headedness, numbness and headaches.   Objective:  Physical Exam: Filed Vitals:   07/28/11 1353 07/28/11 1458  BP: 187/86 166/84  Pulse: 79   Temp: 97.1 F (36.2 C)   TempSrc: Oral   Weight: 173 lb 11.2 oz (78.79 kg)    Constitutional: Vital signs reviewed.  Patient is a well-developed and well-nourished woman in no acute distress and cooperative with exam.  Mouth: no erythema or exudates, MMM Eyes: Left eye not reactive to light (chronically blind in left eye), EOMI, conjunctivae normal, No scleral icterus.  Neck: Supple, Trachea midline normal ROM, No JVD, mass, thyromegaly  Cardiovascular: RRR, S1 normal, S2 normal, no MRG, pulses symmetric and intact bilaterally Pulmonary/Chest: CTAB, no wheezes, rales, or rhonchi Abdominal: Soft. Non-tender,  non-distended, bowel sounds are normal, no masses, organomegaly, or guarding present.  Musculoskeletal: No joint deformities, erythema, or stiffness, ROM full and no nontender (in b/l knees) Hematology: no cervical, inginal, or axillary adenopathy.  Neurological: A&O x3, Strength is normal and symmetric bilaterally, cranial nerve II-XII are grossly intact, no focal motor deficit, sensory intact to light touch bilaterally.  Skin: Warm, dry and intact. No rash, cyanosis, or clubbing.   Assessment & Plan:  Case and care discussed with Dr. Marinda Elk.  Please see problem oriented charting for further detail.  Patient to return in 6-8 weeks for HTN check.

## 2011-07-28 NOTE — Assessment & Plan Note (Signed)
Patient is on coumadin therapy for h/o DVT.  She does not recall the exact history surrounding this event, but has been on coumadin for 20 years.  I will have to check echart/centricity/paper chart to further evaluate need for continued anticoagulation.  Given her age and vision impairment, she would likely benefit from discontinuing.

## 2011-07-28 NOTE — Assessment & Plan Note (Signed)
Lab Results  Component Value Date   HGBA1C 8.2 07/28/2011   HGBA1C 7.2 04/16/2010   CREATININE 1.66* 12/10/2010   CREATININE 1.99* 04/16/2010   MICROALBUR 12.60* 04/26/2007   MICRALBCREAT 196.0* 04/26/2007   CHOL 133 12/10/2010   HDL 38* 12/10/2010   TRIG 104 12/10/2010   Last eye exam and foot exam:    Component Value Date/Time   HMDIABEYEEXA mild non-proliferative diabetic retinopathy;glaucoma mils 07/13/2011   HMDIABFOOTEX done 03/2011    Assessment: Diabetes control: not controlled Progress toward goals: improved Barriers to meeting goals: visual impairment and dietary indiscretions   Plan: Diabetes treatment: continue current medications Refer to: diabetes educator for medical nutrition therapy Instruction/counseling given: reminded to bring blood glucose meter & log to each visit, reminded to bring medications to each visit, discussed the need for weight loss and discussed diet At this visit, we will also check microalbumin-Cr ratio & CMET to reassess renal fxn.  LDL was 74 in May 2012.   Patient to return in 3 months for DM follow up.  She is not a great candidate for insulin therapy given visual impairment and inability to check frequent CBGs.  She is also fearful of using a needle on her own.  She notes that she will try to eat better, and has agreed to visit with Debera Lat.  Given her renal function, antiglycemic options are limited, and she is already on 2 oral medications.

## 2011-07-29 LAB — MICROALBUMIN / CREATININE URINE RATIO: Microalb Creat Ratio: 195.2 mg/g — ABNORMAL HIGH (ref 0.0–30.0)

## 2011-07-29 LAB — COMPREHENSIVE METABOLIC PANEL
ALT: 9 U/L (ref 0–35)
Albumin: 4.2 g/dL (ref 3.5–5.2)
CO2: 27 mEq/L (ref 19–32)
Calcium: 9.2 mg/dL (ref 8.4–10.5)
Chloride: 100 mEq/L (ref 96–112)
Glucose, Bld: 189 mg/dL — ABNORMAL HIGH (ref 70–99)
Potassium: 5.1 mEq/L (ref 3.5–5.3)
Sodium: 135 mEq/L (ref 135–145)
Total Bilirubin: 0.5 mg/dL (ref 0.3–1.2)
Total Protein: 6.8 g/dL (ref 6.0–8.3)

## 2011-08-02 ENCOUNTER — Ambulatory Visit (INDEPENDENT_AMBULATORY_CARE_PROVIDER_SITE_OTHER): Payer: Medicare Other | Admitting: Pharmacist

## 2011-08-02 DIAGNOSIS — Z86718 Personal history of other venous thrombosis and embolism: Secondary | ICD-10-CM

## 2011-08-02 DIAGNOSIS — Z7901 Long term (current) use of anticoagulants: Secondary | ICD-10-CM

## 2011-08-02 DIAGNOSIS — I6350 Cerebral infarction due to unspecified occlusion or stenosis of unspecified cerebral artery: Secondary | ICD-10-CM

## 2011-08-02 DIAGNOSIS — I639 Cerebral infarction, unspecified: Secondary | ICD-10-CM

## 2011-08-02 LAB — POCT INR: INR: 1.9

## 2011-08-02 NOTE — Patient Instructions (Signed)
Patient instructed to take medications as defined in the Anti-coagulation Track section of this encounter.  Patient instructed to take today's dose.  Patient verbalized understanding of these instructions.    

## 2011-08-02 NOTE — Progress Notes (Signed)
Anti-Coagulation Progress Note  Chelsea Mcdaniel is a 78 y.o. female who is currently on an anti-coagulation regimen.    RECENT RESULTS: Recent results are below, the most recent result is correlated with a dose of 37.5 mg. per week: Lab Results  Component Value Date   INR 1.90 08/02/2011   INR 4.3 07/12/2011   INR 2.1 06/21/2011    ANTI-COAG DOSE:   Latest dosing instructions   Total Sun Mon Tue Wed Thu Fri Sat   41.25 7.5 mg 3.75 mg 7.5 mg 3.75 mg 7.5 mg 3.75 mg 7.5 mg    (7.5 mg1) (7.5 mg0.5) (7.5 mg1) (7.5 mg0.5) (7.5 mg1) (7.5 mg0.5) (7.5 mg1)         ANTICOAG SUMMARY: Anticoagulation Episode Summary              Current INR goal 2.0-3.0 Next INR check 08/23/2011   INR from last check 1.90! (08/02/2011)     Weekly max dose (mg)  Target end date Indefinite   Indications Long-term (current) use of anticoagulants, Personal history of DVT (deep vein thrombosis)   [facility] Pref[facility]    Send INR reminders to [facility]   Comments        Provider Role Specialty Phone number   [name]  Internal Medicine [phone]        ANTICOAG TODAY: Anticoagulation Summary as of 08/02/2011              INR goal 2.0-3.0     Selected INR 1.90! (08/02/2011) Next INR check 08/23/2011   Weekly max dose (mg)  Target end date Indefinite   Indications Long-term (current) use of anticoagulants, Personal history of DVT (deep vein thrombosis)    Anticoagulation Episode Summary              INR check location [facility] lab    Send INR reminders to [facility]   Comments        Provider Role Specialty Phone number   [name]  Internal Medicine [phone]        PATIENT INSTRUCTIONS: Patient Instructions  Patient instructed to take medications as defined in the Anti-coagulation Track section of this encounter.  Patient instructed to take today's dose.  Patient verbalized understanding of these instructions.        FOLLOW-UP Return in 3 weeks (on 08/23/2011) for Follow up INR.  [name], III Pharm.D., CACP

## 2011-08-23 ENCOUNTER — Ambulatory Visit (INDEPENDENT_AMBULATORY_CARE_PROVIDER_SITE_OTHER): Payer: Medicare Other | Admitting: Pharmacist

## 2011-08-23 DIAGNOSIS — Z86718 Personal history of other venous thrombosis and embolism: Secondary | ICD-10-CM

## 2011-08-23 DIAGNOSIS — Z7901 Long term (current) use of anticoagulants: Secondary | ICD-10-CM

## 2011-08-23 LAB — POCT INR: INR: 3.2

## 2011-08-23 NOTE — Progress Notes (Signed)
Anti-Coagulation Progress Note  Chelsea Mcdaniel is a 78 y.o. female who is currently on an anti-coagulation regimen.    RECENT RESULTS: Recent results are below, the most recent result is correlated with a dose of 41.25 mg. per week: Lab Results  Component Value Date   INR 3.20 08/23/2011   INR 1.90 08/02/2011   INR 4.3 07/12/2011    ANTI-COAG DOSE:   Latest dosing instructions   Total Sun Mon Tue Wed Thu Fri Sat   37.5 7.5 mg 3.75 mg 3.75 mg 3.75 mg 7.5 mg 3.75 mg 7.5 mg    (7.5 mg1) (7.5 mg0.5) (7.5 mg0.5) (7.5 mg0.5) (7.5 mg1) (7.5 mg0.5) (7.5 mg1)         ANTICOAG SUMMARY: Anticoagulation Episode Summary              Current INR goal 2.0-3.0 Next INR check 09/20/2011   INR from last check 3.20! (08/23/2011)     Weekly max dose (mg)  Target end date Indefinite   Indications Long-term (current) use of anticoagulants, Personal history of DVT (deep vein thrombosis)   INR check location [facility] Preferred lab    Send INR reminders to [facility]   Comments        Provider Role Specialty Phone number   [name], DO  Internal Medicine [phone]        ANTICOAG TODAY: Anticoagulation Summary as of 08/23/2011              INR goal 2.0-3.0     Selected INR 3.20! (08/23/2011) Next INR check 09/20/2011   Weekly max dose (mg)  Target end date Indefinite   Indications Long-term (current) use of anticoagulants, Personal history of DVT (deep vein thrombosis)    Anticoagulation Episode Summary              INR check location [facility] lab    Send INR reminders to [facility]   Comments        Provider Role Specialty Phone number   [name], [place]  Internal Medicine [phone]        PATIENT INSTRUCTIONS: Patient Instructions  Patient instructed to take medications as defined in the Anti-coagulation Track section of this encounter.  Patient instructed to take today's dose.  Patient verbalized understanding of these  instructions.        FOLLOW-UP Return in 4 weeks (on 09/20/2011) for Follow up INR.  [name], III Pharm.D., CACP

## 2011-08-23 NOTE — Patient Instructions (Signed)
Patient instructed to take medications as defined in the Anti-coagulation Track section of this encounter.  Patient instructed to take today's dose.  Patient verbalized understanding of these instructions.    

## 2011-09-09 ENCOUNTER — Encounter: Payer: Medicare Other | Admitting: Dietician

## 2011-09-20 ENCOUNTER — Ambulatory Visit (INDEPENDENT_AMBULATORY_CARE_PROVIDER_SITE_OTHER): Payer: Medicare Other | Admitting: Pharmacist

## 2011-09-20 DIAGNOSIS — Z7901 Long term (current) use of anticoagulants: Secondary | ICD-10-CM

## 2011-09-20 DIAGNOSIS — Z86718 Personal history of other venous thrombosis and embolism: Secondary | ICD-10-CM

## 2011-09-20 NOTE — Progress Notes (Signed)
Anti-Coagulation Progress Note  Chelsea Mcdaniel is a 78 y.o. female who is currently on an anti-coagulation regimen.    RECENT RESULTS: Recent results are below, the most recent result is correlated with a dose of 37.5 mg. per week: Lab Results  Component Value Date   INR 2.5 09/20/2011   INR 3.20 08/23/2011   INR 1.90 08/02/2011    ANTI-COAG DOSE:   Latest dosing instructions   Total Sun Mon Tue Wed Thu Fri Sat   37.5 7.5 mg 3.75 mg 3.75 mg 3.75 mg 7.5 mg 3.75 mg 7.5 mg    (7.5 mg1) (7.5 mg0.5) (7.5 mg0.5) (7.5 mg0.5) (7.5 mg1) (7.5 mg0.5) (7.5 mg1)         ANTICOAG SUMMARY: Anticoagulation Episode Summary              Current INR goal 2.0-3.0 Next INR check 10/18/2011   INR from last check 2.5 (09/20/2011)     Weekly max dose (mg)  Target end date Indefinite   Indications Long-term (current) use of anticoagulants, Personal history of DVT (deep vein thrombosis)   INR check location [facility] Preferred [facility]    Send INR reminders to [facility]   Comments        Provider Role Specialty Phone number   [name], DO  Internal Medicine [phone]        ANTICOAG TODAY: Anticoagulation Summary as of 09/20/2011              INR goal 2.0-3.0     Selected INR 2.5 (09/20/2011) Next INR check 10/18/2011   Weekly max dose (mg)  Target end date Indefinite   Indications Long-term (current) use of anticoagulants, Personal history of DVT (deep vein thrombosis)    Anticoagulation Episode Summary              INR check location [facility] Preferred lab    Send INR reminders to [facility]   Comments        Provider Role Specialty Phone number   [name], DO  Internal Medicine [phone]        PATIENT INSTRUCTIONS: Patient Instructions  Patient instructed to take medications as defined in the Anti-coagulation Track section of this encounter.  Patient instructed to take today's dose.  Patient verbalized understanding of these instructions.          FOLLOW-UP Return in 4 weeks (on 10/18/2011) for Follow up INR.  [name], III Pharm.D., CACP

## 2011-09-20 NOTE — Patient Instructions (Signed)
Patient instructed to take medications as defined in the Anti-coagulation Track section of this encounter.  Patient instructed to take today's dose.  Patient verbalized understanding of these instructions.    

## 2011-09-22 ENCOUNTER — Encounter: Payer: Medicare Other | Admitting: Dietician

## 2011-10-01 ENCOUNTER — Encounter: Payer: Self-pay | Admitting: Internal Medicine

## 2011-10-18 ENCOUNTER — Ambulatory Visit: Payer: Medicare Other

## 2011-10-18 ENCOUNTER — Ambulatory Visit: Payer: Medicare Other | Admitting: Dietician

## 2011-10-18 ENCOUNTER — Telehealth: Payer: Self-pay | Admitting: Dietician

## 2011-10-18 ENCOUNTER — Ambulatory Visit (INDEPENDENT_AMBULATORY_CARE_PROVIDER_SITE_OTHER): Payer: Medicare Other | Admitting: Pharmacist

## 2011-10-18 DIAGNOSIS — Z86718 Personal history of other venous thrombosis and embolism: Secondary | ICD-10-CM

## 2011-10-18 DIAGNOSIS — Z7901 Long term (current) use of anticoagulants: Secondary | ICD-10-CM

## 2011-10-18 NOTE — Progress Notes (Signed)
Patient left prior to being seen by CDE today

## 2011-10-18 NOTE — Patient Instructions (Signed)
Patient instructed to take medications as defined in the Anti-coagulation Track section of this encounter.  Patient instructed to take today's dose.  Patient verbalized understanding of these instructions.    

## 2011-10-18 NOTE — Progress Notes (Signed)
Anti-Coagulation Progress Note  Chelsea Mcdaniel is a 78 y.o. female who is currently on an anti-coagulation regimen.    RECENT RESULTS: Recent results are below, the most recent result is correlated with a dose of 37.5 mg. per week: Lab Results  Component Value Date   INR 2.0 10/18/2011   INR 2.5 09/20/2011   INR 3.20 08/23/2011    ANTI-COAG DOSE:   Latest dosing instructions   Total Sun Mon Tue Wed Thu Fri Sat   41.25 7.5 mg 3.75 mg 7.5 mg 3.75 mg 7.5 mg 3.75 mg 7.5 mg    (7.5 mg1) (7.5 mg0.5) (7.5 mg1) (7.5 mg0.5) (7.5 mg1) (7.5 mg0.5) (7.5 mg1)         ANTICOAG SUMMARY: Anticoagulation Episode Summary              Current INR goal 2.0-3.0 Next INR check 11/15/2011   INR from last check 2.0 (10/18/2011)     Weekly max dose (mg)  Target end date Indefinite   Indications Long-term (current) use of anticoagulants, Personal history of DVT (deep vein thrombosis)   [facility] check location [facility] Preferred lab    Send INR reminders to [facility]   Comments        Provider Role Specialty Phone number   Acquanetta Chain, DO  Internal Medicine [phone]        ANTICOAG TODAY: Anticoagulation Summary as of 10/18/2011              INR goal 2.0-3.0     Selected INR 2.0 (10/18/2011) Next INR check 11/15/2011   Weekly max dose (mg)  Target end date Indefinite   Indications Long-term (current) use of anticoagulants, Personal history of DVT (deep vein thrombosis)    Anticoagulation Episode Summary              INR check location [facility] Pref[facility]    Send INR reminders to [facility]   Comments        Provider Role Specialty Phone number   Acquanetta Chain, DO  Internal Medicine [phone]        PATIENT INSTRUCTIONS: Patient Instructions  Patient instructed to take medications as defined in the Anti-coagulation Track section of this encounter.  Patient instructed to take today's dose.  Patient verbalized understanding of these  instructions.        FOLLOW-UP Return in 4 weeks (on 11/15/2011) for Follow up INR.  [name], III Pharm.D., CACP

## 2011-10-18 NOTE — Telephone Encounter (Signed)
Per Dr. Margarita Grizzle office her Last eye exam 09-08-2010. Will ask patient to schedule her yearly diabetes eye exam.

## 2011-10-18 NOTE — Progress Notes (Signed)
Addended by: Hulan Fray on: 10/18/2011 06:28 PM   Modules accepted: Orders

## 2011-11-15 ENCOUNTER — Ambulatory Visit (INDEPENDENT_AMBULATORY_CARE_PROVIDER_SITE_OTHER): Payer: PRIVATE HEALTH INSURANCE | Admitting: Pharmacist

## 2011-11-15 DIAGNOSIS — Z86718 Personal history of other venous thrombosis and embolism: Secondary | ICD-10-CM

## 2011-11-15 DIAGNOSIS — Z7901 Long term (current) use of anticoagulants: Secondary | ICD-10-CM

## 2011-11-15 LAB — POCT INR: INR: 2.1

## 2011-11-15 NOTE — Progress Notes (Signed)
Anti-Coagulation Progress Note  Chelsea MELDOY Mcdaniel is a 78 y.o. female who is currently on an anti-coagulation regimen.    RECENT RESULTS: Recent results are below, the most recent result is correlated with a dose of 41.25 mg. per week: Lab Results  Component Value Date   INR 2.10 11/15/2011   INR 2.0 10/18/2011   INR 2.5 09/20/2011    ANTI-COAG DOSE:   Latest dosing instructions   Total Sun Mon Tue Wed Thu Fri Sat   41.25 7.5 mg 3.75 mg 7.5 mg 3.75 mg 7.5 mg 3.75 mg 7.5 mg    (7.5 mg1) (7.5 mg0.5) (7.5 mg1) (7.5 mg0.5) (7.5 mg1) (7.5 mg0.5) (7.5 mg1)         ANTICOAG SUMMARY: Anticoagulation Episode Summary              Current INR goal 2.0-3.0 Next INR check 12/06/2011   INR from last check 2.10 (11/15/2011)     Weekly max dose (mg)  Target end date Indefinite   Indications Long-term (current) use of anticoagulants, Personal history of DVT (deep vein thrombosis)   INR check location [facility] Pref[facility]    Send INR reminders to [facility]   Comments        Provider Role Specialty Phone number   Acquanetta Chain, DO  Internal Medicine [phone]        ANTICOAG TODAY: Anticoagulation Summary as of 11/15/2011              INR goal 2.0-3.0     Selected INR 2.10 (11/15/2011) Next INR check 12/06/2011   Weekly max dose (mg)  Target end date Indefinite   Indications Long-term (current) use of anticoagulants, Personal history of DVT (deep vein thrombosis)    Anticoagulation Episode Summary              INR check location [facility]    Send INR reminders to [facility]   Comments        Provider Role Specialty Phone number   Acquanetta Chain, DO  Internal Medicine [phone]        PATIENT INSTRUCTIONS: Patient Instructions  Patient instructed to take medications as defined in the Anti-coagulation Track section of this encounter.  Patient instructed to tak3 today's dose.  Patient verbalized understanding of these  instructions.        FOLLOW-UP Return in 3 weeks (on 12/06/2011) for Follow up INR.  [name], III Pharm.D., CACP

## 2011-11-15 NOTE — Patient Instructions (Signed)
Patient instructed to take medications as defined in the Anti-coagulation Track section of this encounter.  Patient instructed to tak3 today's dose.  Patient verbalized understanding of these instructions.

## 2011-12-06 ENCOUNTER — Ambulatory Visit (INDEPENDENT_AMBULATORY_CARE_PROVIDER_SITE_OTHER): Payer: PRIVATE HEALTH INSURANCE | Admitting: Pharmacist

## 2011-12-06 DIAGNOSIS — Z86718 Personal history of other venous thrombosis and embolism: Secondary | ICD-10-CM

## 2011-12-06 DIAGNOSIS — Z7901 Long term (current) use of anticoagulants: Secondary | ICD-10-CM

## 2011-12-06 LAB — POCT INR: INR: 1.8

## 2011-12-06 MED ORDER — WARFARIN SODIUM 7.5 MG PO TABS: ORAL_TABLET | ORAL | Status: DC

## 2011-12-06 NOTE — Progress Notes (Signed)
Anti-Coagulation Progress Note  Chelsea Mcdaniel is a 78 y.o. female who is currently on an anti-coagulation regimen.    RECENT RESULTS: Recent results are below, the most recent result is correlated with a dose of 41.25 mg. per week: Lab Results  Component Value Date   INR 1.80 12/06/2011   INR 2.10 11/15/2011   INR 2.0 10/18/2011    ANTI-COAG DOSE:   Latest dosing instructions   Total Sun Mon Tue Wed Thu Fri Sat   45 7.5 mg 7.5 mg 7.5 mg 3.75 mg 7.5 mg 3.75 mg 7.5 mg    (7.5 mg1) (7.5 mg1) (7.5 mg1) (7.5 mg0.5) (7.5 mg1) (7.5 mg0.5) (7.5 mg1)         ANTICOAG SUMMARY: Anticoagulation Episode Summary              Current INR goal 2.0-3.0 Next INR check 12/27/2011   INR from last check 1.80! (12/06/2011)     Weekly max dose (mg)  Target end date Indefinite   Indications Long-term (current) use of anticoagulants, Personal history of DVT (deep vein thrombosis)   INR check location [facility] Pref[facility]    Send INR reminders to [facility]   Comments        Provider Role Specialty Phone number   Acquanetta Chain, DO  Internal Medicine [phone]        ANTICOAG TODAY: Anticoagulation Summary as of 12/06/2011              INR goal 2.0-3.0     Selected INR 1.80! (12/06/2011) Next INR check 12/27/2011   Weekly max dose (mg)  Target end date Indefinite   Indications Long-term (current) use of anticoagulants, Personal history of DVT (deep vein thrombosis)    Anticoagulation Episode Summary              INR check location [facility]    Send INR reminders to [facility]   Comments        Provider Role Specialty Phone number   Acquanetta Chain, DO  Internal Medicine [phone]        PATIENT INSTRUCTIONS: Patient Instructions  Patient instructed to take medications as defined in the Anti-coagulation Track section of this encounter.  Patient instructed to take today's dose.  Patient verbalized understanding of these instructions.          FOLLOW-UP Return in 3 weeks (on 12/27/2011) for Follow up INR.  [name], III Pharm.D., CACP

## 2011-12-06 NOTE — Patient Instructions (Signed)
Patient instructed to take medications as defined in the Anti-coagulation Track section of this encounter.  Patient instructed to take today's dose.  Patient verbalized understanding of these instructions.    

## 2011-12-27 ENCOUNTER — Ambulatory Visit (INDEPENDENT_AMBULATORY_CARE_PROVIDER_SITE_OTHER): Payer: PRIVATE HEALTH INSURANCE | Admitting: Pharmacist

## 2011-12-27 DIAGNOSIS — Z7901 Long term (current) use of anticoagulants: Secondary | ICD-10-CM

## 2011-12-27 DIAGNOSIS — Z86718 Personal history of other venous thrombosis and embolism: Secondary | ICD-10-CM

## 2011-12-27 NOTE — Patient Instructions (Signed)
Patient instructed to take medications as defined in the Anti-coagulation Track section of this encounter.  Patient instructed to OMIT today's dose.  Patient verbalized understanding of these instructions.    

## 2011-12-27 NOTE — Progress Notes (Signed)
Anti-Coagulation Progress Note  Chelsea Mcdaniel is a 78 y.o. female who is currently on an anti-coagulation regimen.    RECENT RESULTS: Recent results are below, the most recent result is correlated with a dose of 41.25 mg. per week: Lab Results  Component Value Date   INR 3.90 12/27/2011   INR 1.80 12/06/2011   INR 2.10 11/15/2011    ANTI-COAG DOSE:   Latest dosing instructions   Total Sun Mon Tue Wed Thu Fri Sat   37.5 3.75 mg 7.5 mg 3.75 mg 7.5 mg 3.75 mg 7.5 mg 3.75 mg    (7.5 mg0.5) (7.5 mg1) (7.5 mg0.5) (7.5 mg1) (7.5 mg0.5) (7.5 mg1) (7.5 mg0.5)         ANTICOAG SUMMARY: Anticoagulation Episode Summary              Current INR goal 2.0-3.0 Next INR check 01/17/2012   INR from last check 3.90! (12/27/2011)     Weekly max dose (mg)  Target end date Indefinite   Indications Long-term (current) use of anticoagulants, Personal history of DVT (deep vein thrombosis)   [facility]R check location [facility]    Send INR reminders to [facility]   Comments        Provider Role Specialty Phone number   Acquanetta Chain, DO  Internal Medicine [phone]        ANTICOAG TODAY: Anticoagulation Summary as of 12/27/2011              INR goal 2.0-3.0     Selected INR 3.90! (12/27/2011) Next INR check 01/17/2012   Weekly max dose (mg)  Target end date Indefinite   Indications Long-term (current) use of anticoagulants, Personal history of DVT (deep vein thrombosis)    Anticoagulation Episode Summary              INR check location [facility] Pref[facility]    Send INR reminders to [facility]   Comments        Provider Role Specialty Phone number   Acquanetta Chain, DO  Internal Medicine [phone]        PATIENT INSTRUCTIONS: Patient Instructions  Patient instructed to take medications as defined in the Anti-coagulation Track section of this encounter.  Patient instructed to OMIT today's dose.  Patient verbalized understanding of these  instructions.        FOLLOW-UP Return in 3 weeks (on 01/17/2012) for Follow up INR at 3:30PM.  [name], III Pharm.D., CACP

## 2012-01-04 ENCOUNTER — Telehealth: Payer: Self-pay | Admitting: *Deleted

## 2012-01-04 NOTE — Telephone Encounter (Signed)
Agree with plan to try tylenol.  If no relief she will call in for a formal appointment.  She is already scheduled to see Dr. Cathren Laine on 6/18 so if the tylenol is effective she will still be examined in 1 week.

## 2012-01-04 NOTE — Telephone Encounter (Signed)
Pt called with c/o rt side wrist pain.  No injury, onset last night.  Denies redness, swelling. Hurts with movement. No history of pain in this area.  No radiation of pain. She has not taken any med for pain so I suggested tylenol and if she does not have any relief please call back and we will see her in clinic to evaluate.   Please advise.

## 2012-01-05 ENCOUNTER — Other Ambulatory Visit: Payer: Self-pay | Admitting: *Deleted

## 2012-01-05 MED ORDER — AMLODIPINE BESYLATE 10 MG PO TABS: 10.0000 mg | ORAL_TABLET | Freq: Every day | ORAL | Status: DC

## 2012-01-11 ENCOUNTER — Encounter: Payer: PRIVATE HEALTH INSURANCE | Admitting: Internal Medicine

## 2012-01-17 ENCOUNTER — Ambulatory Visit (INDEPENDENT_AMBULATORY_CARE_PROVIDER_SITE_OTHER): Payer: PRIVATE HEALTH INSURANCE | Admitting: Pharmacist

## 2012-01-17 ENCOUNTER — Other Ambulatory Visit: Payer: Self-pay | Admitting: *Deleted

## 2012-01-17 DIAGNOSIS — Z86718 Personal history of other venous thrombosis and embolism: Secondary | ICD-10-CM

## 2012-01-17 DIAGNOSIS — Z7901 Long term (current) use of anticoagulants: Secondary | ICD-10-CM

## 2012-01-17 LAB — POCT INR: INR: 2.1

## 2012-01-17 NOTE — Patient Instructions (Signed)
Patient instructed to take medications as defined in the Anti-coagulation Track section of this encounter.  Patient instructed to take today's dose.  Patient verbalized understanding of these instructions.    

## 2012-01-17 NOTE — Progress Notes (Signed)
Anti-Coagulation Progress Note  Shizuko HAIDYNN JINKERSON is a 78 y.o. female who is currently on an anti-coagulation regimen.    RECENT RESULTS: Recent results are below, the most recent result is correlated with a dose of 37.5 mg. per week: Lab Results  Component Value Date   INR 2.10 01/17/2012   INR 3.90 12/27/2011   INR 1.80 12/06/2011    ANTI-COAG DOSE:   Latest dosing instructions   Total Sun Mon Tue Wed Thu Fri Sat   41.25 3.75 mg 7.5 mg 7.5 mg 7.5 mg 3.75 mg 7.5 mg 3.75 mg    (7.5 mg0.5) (7.5 mg1) (7.5 mg1) (7.5 mg1) (7.5 mg0.5) (7.5 mg1) (7.5 mg0.5)         ANTICOAG SUMMARY: Anticoagulation Episode Summary              Current INR goal 2.0-3.0 Next INR check 02/14/2012   INR from last check 2.10 (01/17/2012)     Weekly max dose (mg)  Target end date Indefinite   Indications Long-term (current) use of anticoagulants, Personal history of DVT (deep vein thrombosis)   [facility]R check location [facility]    Send INR reminders to [facility]   Comments        Provider Role Specialty Phone number   Acquanetta Chain, DO  Internal Medicine [phone]        ANTICOAG TODAY: Anticoagulation Summary as of 01/17/2012              INR goal 2.0-3.0     Selected INR 2.10 (01/17/2012) Next INR check 02/14/2012   Weekly max dose (mg)  Target end date Indefinite   Indications Long-term (current) use of anticoagulants, Personal history of DVT (deep vein thrombosis)    Anticoagulation Episode Summary              INR check location [facility] Pref[facility]    Send INR reminders to [facility]   Comments        Provider Role Specialty Phone number   Acquanetta Chain, DO  Internal Medicine [phone]        PATIENT INSTRUCTIONS: Patient Instructions  Patient instructed to take medications as defined in the Anti-coagulation Track section of this encounter.  Patient instructed to take today's dose.  Patient verbalized understanding of these  instructions.        FOLLOW-UP Return in 4 weeks (on 02/14/2012) for Follow up INR at 3:45PM.  [name], III Pharm.D., CACP

## 2012-01-18 MED ORDER — BENAZEPRIL HCL 40 MG PO TABS: 40.0000 mg | ORAL_TABLET | Freq: Every day | ORAL | Status: DC

## 2012-02-14 ENCOUNTER — Ambulatory Visit (INDEPENDENT_AMBULATORY_CARE_PROVIDER_SITE_OTHER): Payer: PRIVATE HEALTH INSURANCE | Admitting: Pharmacist

## 2012-02-14 ENCOUNTER — Other Ambulatory Visit: Payer: Self-pay | Admitting: *Deleted

## 2012-02-14 DIAGNOSIS — Z86718 Personal history of other venous thrombosis and embolism: Secondary | ICD-10-CM

## 2012-02-14 DIAGNOSIS — E119 Type 2 diabetes mellitus without complications: Secondary | ICD-10-CM

## 2012-02-14 DIAGNOSIS — E785 Hyperlipidemia, unspecified: Secondary | ICD-10-CM

## 2012-02-14 DIAGNOSIS — I10 Essential (primary) hypertension: Secondary | ICD-10-CM

## 2012-02-14 DIAGNOSIS — Z7901 Long term (current) use of anticoagulants: Secondary | ICD-10-CM

## 2012-02-14 LAB — POCT INR: INR: 2.3

## 2012-02-14 NOTE — Progress Notes (Signed)
Anti-Coagulation Progress Note  Chelsea Mcdaniel is a 78 y.o. female who is currently on an anti-coagulation regimen.    RECENT RESULTS: Recent results are below, the most recent result is correlated with a dose of 41.25 mg. per week: Lab Results  Component Value Date   INR 2.30 02/14/2012   INR 2.10 01/17/2012   INR 3.90 12/27/2011    ANTI-COAG DOSE:   Latest dosing instructions   Total Sun Mon Tue Wed Thu Fri Sat   41.25 3.75 mg 7.5 mg 7.5 mg 7.5 mg 3.75 mg 7.5 mg 3.75 mg    (7.5 mg0.5) (7.5 mg1) (7.5 mg1) (7.5 mg1) (7.5 mg0.5) (7.5 mg1) (7.5 mg0.5)         ANTICOAG SUMMARY: Anticoagulation Episode Summary              Current INR goal 2.0-3.0 Next INR check 03/13/2012   INR from last check 2.30 (02/14/2012)     Weekly max dose (mg)  Target end date Indefinite   Indications Long-term (current) use of anticoagulants, Personal history of DVT (deep vein thrombosis)   INR check location [facility]    Send INR reminders to [facility]   Comments        Provider Role Specialty Phone number   Acquanetta Chain, DO  Internal Medicine [phone]        ANTICOAG TODAY: Anticoagulation Summary as of 02/14/2012              INR goal 2.0-3.0     Selected INR 2.30 (02/14/2012) Next INR check 03/13/2012   Weekly max dose (mg)  Target end date Indefinite   Indications Long-term (current) use of anticoagulants, Personal history of DVT (deep vein thrombosis)    Anticoagulation Episode Summary              INR check location [facility]    Send INR reminders to [facility]   Comments        Provider Role Specialty Phone number   Acquanetta Chain, DO  Internal Medicine [phone]        PATIENT INSTRUCTIONS: Patient Instructions  Patient instructed to take medications as defined in the Anti-coagulation Track section of this encounter.  Patient instructed to take today's dose.  Patient verbalized understanding of these  instructions.        FOLLOW-UP Return in 4 weeks (on 03/13/2012) for Follow up INR at 1615h.  [name], III Pharm.D., CACP

## 2012-02-14 NOTE — Patient Instructions (Signed)
Patient instructed to take medications as defined in the Anti-coagulation Track section of this encounter.  Patient instructed to take today's dose.  Patient verbalized understanding of these instructions.    

## 2012-02-15 NOTE — Progress Notes (Signed)
Agree with plan 

## 2012-02-16 ENCOUNTER — Encounter: Payer: Self-pay | Admitting: Internal Medicine

## 2012-02-16 ENCOUNTER — Ambulatory Visit (INDEPENDENT_AMBULATORY_CARE_PROVIDER_SITE_OTHER): Payer: PRIVATE HEALTH INSURANCE | Admitting: Internal Medicine

## 2012-02-16 VITALS — BP 148/62 | HR 74 | Temp 97.0°F | Ht 64.0 in | Wt 166.2 lb

## 2012-02-16 DIAGNOSIS — M79643 Pain in unspecified hand: Secondary | ICD-10-CM

## 2012-02-16 DIAGNOSIS — M25569 Pain in unspecified knee: Secondary | ICD-10-CM

## 2012-02-16 DIAGNOSIS — E119 Type 2 diabetes mellitus without complications: Secondary | ICD-10-CM

## 2012-02-16 DIAGNOSIS — Z79899 Other long term (current) drug therapy: Secondary | ICD-10-CM

## 2012-02-16 DIAGNOSIS — D649 Anemia, unspecified: Secondary | ICD-10-CM

## 2012-02-16 DIAGNOSIS — I129 Hypertensive chronic kidney disease with stage 1 through stage 4 chronic kidney disease, or unspecified chronic kidney disease: Secondary | ICD-10-CM

## 2012-02-16 DIAGNOSIS — I10 Essential (primary) hypertension: Secondary | ICD-10-CM

## 2012-02-16 DIAGNOSIS — N184 Chronic kidney disease, stage 4 (severe): Secondary | ICD-10-CM | POA: Insufficient documentation

## 2012-02-16 DIAGNOSIS — M79609 Pain in unspecified limb: Secondary | ICD-10-CM

## 2012-02-16 DIAGNOSIS — M19049 Primary osteoarthritis, unspecified hand: Secondary | ICD-10-CM | POA: Insufficient documentation

## 2012-02-16 DIAGNOSIS — E785 Hyperlipidemia, unspecified: Secondary | ICD-10-CM

## 2012-02-16 DIAGNOSIS — D6489 Other specified anemias: Secondary | ICD-10-CM

## 2012-02-16 DIAGNOSIS — Z7901 Long term (current) use of anticoagulants: Secondary | ICD-10-CM

## 2012-02-16 MED ORDER — ROSUVASTATIN CALCIUM 5 MG PO TABS: 5.0000 mg | ORAL_TABLET | Freq: Every day | ORAL | Status: DC

## 2012-02-16 MED ORDER — SIMVASTATIN 20 MG PO TABS: 20.0000 mg | ORAL_TABLET | Freq: Every day | ORAL | Status: DC

## 2012-02-16 MED ORDER — GLIPIZIDE ER 10 MG PO TB24: 10.0000 mg | ORAL_TABLET | Freq: Every day | ORAL | Status: DC

## 2012-02-16 MED ORDER — FUROSEMIDE 40 MG PO TABS: 40.0000 mg | ORAL_TABLET | Freq: Every day | ORAL | Status: DC

## 2012-02-16 NOTE — Assessment & Plan Note (Addendum)
Patient's d/c summary from 09/20/99 reads:  Discharge summary from 09/20/1999 reads: Status post CVAs in 1985 and 1993.  In 1993, she had CVAs while on aspirin and Plavix.  She is also status post right CEA. Left lower extremity DVT with left greater saphenous vein thrombus in 1993.  For this reason, we will continue anticoagulation for now, but as she continues to age, given her vision impairment, we may soon d/c anticoagulation if her fall risk increases.

## 2012-02-16 NOTE — Assessment & Plan Note (Addendum)
Lab Results  Component Value Date   HGBA1C 7.6 02/16/2012   HGBA1C 7.2 04/16/2010   CREATININE 1.58* 07/28/2011   CREATININE 1.99* 04/16/2010   MICROALBUR 23.75* 07/28/2011   MICRALBCREAT 195.2* 07/28/2011   CHOL 133 12/10/2010   HDL 38* 12/10/2010   TRIG 104 12/10/2010    Last eye exam and foot exam:    Component Value Date/Time   HMDIABEYEEXA mild non-proliferative diabetic retinopathy;glaucoma mils 10/01/2008   HMDIABFOOTEX done 04/16/2010    Assessment: Diabetes control: not controlled Progress toward goals: improved Barriers to meeting goals: transportation problems, lack of understanding of disease management and visual impairment  Plan: Diabetes treatment: continue current medications Refer to: diabetes educator for medical nutrition therapy Instruction/counseling given: reminded to bring blood glucose meter & log to each visit, reminded to bring medications to each visit, discussed diet and other instruction/counseling: encouraged patient to decrease dessert intake and try to re-schedule with Debera Lat; Given her age and visual limitations, my HbA1c goal for her is <8.  Insulin is not an ideal option as she cannot see and needs help from RN's aid.    Patient to have records from Dr. Ricki Miller sent to [place], as she follows with him q21months given h/o primary open angle glaucoma.

## 2012-02-16 NOTE — Assessment & Plan Note (Signed)
GFR = 40, baseline Cr ~1.6.  Likely related to diabetic & hypertensive nephropathy.    Check BMET today.

## 2012-02-16 NOTE — Patient Instructions (Signed)
-  Please stop taking simvastatin, and pick up rosuvastatin (also called Crestor) - we are changing this medication because of its interaction with another one of your medications.  -Please be sure to follow up with your opthalmologist, Dr. Ricki Miller, this month and have records sent to [place].  -Please try to schedule an appointment with Butch Penny, our dietician.   Please be sure to bring all of your medications with you to every visit.  Should you have any new or worsening symptoms, please be sure to call the clinic at [phone].

## 2012-02-16 NOTE — Progress Notes (Signed)
Subjective:   Patient ID: Chelsea Mcdaniel female   DOB: [birth year] 78 y.o.   MRN: [id number]  HPI: [name] is a pleasant 78 y.o. woman with history of DM, HTN, HLD, CVA and CKD that presents for routine follow up.  Her only complaint today is a sensation of her fingers feeling swollen, though have not appeared swollen.  She denies any loss of strength or feelings of numbness or tingling.  She reports that she has felt this problem for months, if not years.  Right hand feels worse than left.  Her wedding band does not feel any tighter than usual.    Regarding her DM, her health aid continues to check her CBGs BID on MWF (once before breakfast, and a second time in the evening).  We could not download her glucometer, but review of the meter suggests CBGs no lower than 130 and as high as the 300s.  She has tried to meet with [facility] twice, but reports that it hasn't worked out for one reason or another.  She notes she does regularly eat pies/cakes/pudding/etc.  She has no symptoms of hypoglycemia including HA, dizziness, or diaphoresis.  No symptoms of hyperglycemia such has polyuria, polydipsia or polyphagia.    Regarding HTN, the RN aid continues to check her BP MWF, but she does not have her log with her.  At her last visit, patient has SBP as low as 90.    Chronic Anticoagulation: Patient recalls h/o DVT about 20 years ago and has been on coumadin since.  She also has h/o of 2 CVAs per her report.  B/l knee and back pain: knee pain responds well to voltaren gel.  Low back pain only occurs when doing chores, such as making the bed.  Finally, she reports a recent gout flare 2 weeks ago, that has since resolved after taking colchicine and indomethicin.   Current Outpatient Prescriptions  Medication Sig Dispense Refill  . benazepril (LOTENSIN) 40 MG tablet Take 1 tablet (40 mg total) by mouth daily.  90 tablet  0  . Blood Glucose Monitoring Suppl (ACCU-CHEK ADVANTAGE DIABETES) kit Use as  instructed  1 each  0  . Calcium-Vitamin D (CHEWABLE CALCIUM/D) 300 MG CHEW Chew by mouth. Take as directed       . colchicine 0.6 MG tablet Take 0.6 mg by mouth 2 (two) times daily as needed. For gout flare up      . diclofenac sodium (VOLTAREN) 1 % GEL Apply 1 application topically 2 (two) times daily. To knees  1 Tube  5  . furosemide (LASIX) 40 MG tablet Take 1 tablet (40 mg total) by mouth daily.  90 tablet  3  . glipiZIDE (GLUCOTROL XL) 10 MG 24 hr tablet Take 1 tablet (10 mg total) by mouth daily.  90 tablet  3  . glucose blood (ACCU-CHEK AVIVA) test strip Use as instructed  100 each  12  . indomethacin (INDOCIN) 25 MG capsule Take 1 capsule (25 mg total) by mouth 3 (three) times daily. For 5 days  30 capsule  11  . Lancets (ACCU-CHEK SOFT TOUCH) lancets Use as instructed  100 each  12  . warfarin (COUMADIN) 7.5 MG tablet Take as directed by anticoagulation clinic provider.  60 tablet  11  . DISCONTD: simvastatin (ZOCOR) 20 MG tablet Take 1 tablet (20 mg total) by mouth at bedtime.  90 tablet  3  . amLODipine (NORVASC) 10 MG tablet Take 1 tablet (10 mg total) by  mouth daily. For blood pressure  30 tablet  11  . benazepril (LOTENSIN) 40 MG tablet Take 1 tablet (40 mg total) by mouth daily.  90 tablet  3  . colchicine (COLCRYS) 0.6 MG tablet Take 1 tablet (0.6 mg total) by mouth 2 (two) times daily as needed. For gout flare  30 tablet  11  . furosemide (LASIX) 40 MG tablet Take 1 tablet (40 mg total) by mouth daily.  30 tablet  0  . rosuvastatin (CRESTOR) 5 MG tablet Take 1 tablet (5 mg total) by mouth at bedtime.  90 tablet  3  . simvastatin (ZOCOR) 40 MG tablet Take 1 tablet (40 mg total) by mouth at bedtime.  30 tablet  0  . sitaGLIPtan (JANUVIA) 100 MG tablet Take 0.5 tablets (50 mg total) by mouth daily.  15 tablet  11  . DISCONTD: amLODipine (NORVASC) 10 MG tablet Take 1 tablet (10 mg total) by mouth daily.  90 tablet  0   Review of Systems: Constitutional: Denies fever, chills,  diaphoresis, appetite change and fatigue.  HEENT: Denies photophobia, eye pain, redness, hearing loss, ear pain, congestion, sore throat, rhinorrhea, sneezing, mouth sores, trouble swallowing, neck pain, neck stiffness and tinnitus.   Respiratory: Denies SOB, DOE, cough, chest tightness,  and wheezing.   Cardiovascular: Denies chest pain, palpitations and leg swelling.  Gastrointestinal: Denies nausea, vomiting, abdominal pain, diarrhea, constipation, blood in stool and abdominal distention.  Genitourinary: Denies dysuria, urgency, frequency, hematuria, flank pain and difficulty urinating.  Musculoskeletal: Denies myalgias and gait problem.  Skin: Denies pallor, rash and wound.  Neurological: Denies dizziness, seizures, syncope, weakness, light-headedness, numbness and headaches.  Psychiatric/Behavioral: Denies suicidal ideation, mood changes, confusion, nervousness, sleep disturbance and agitation  Objective:  Physical Exam: Filed Vitals:   02/16/12 1536 02/16/12 1637  BP: 156/72 148/62  Pulse: 83 74  Temp: 97 F (36.1 C)   TempSrc: Oral   Height: 5\' 4"  (1.626 m)   Weight: 166 lb 3.2 oz (75.388 kg)    Constitutional: Vital signs reviewed.  Patient is a well-developed and well-nourished woman in no acute distress and cooperative with exam.  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 Non-tender, non-distended, bowel sounds are normal, no masses, organomegaly, or guarding present.  Musculoskeletal: No joint deformities, erythema, or stiffness, ROM full and no nontender  Neurological: A&O x3, Strength is normal and symmetric bilaterally, cranial nerve II-XII are grossly intact, no focal motor deficit,  sensory intact to light touch bilaterally.  Skin: Warm, dry and intact. No rash, cyanosis, or clubbing.  Psychiatric: Normal mood and affect. speech and behavior is normal. Judgment and thought content normal. Cognition and memory are normal.   Assessment & Plan:   Case and care discussed with Dr. Murlean Caller.  Patient to return in 3 months for DM follow up.  Please see problem oriented charting for further details.

## 2012-02-16 NOTE — Assessment & Plan Note (Signed)
Well managed with Voltaren gel.  Asked that she try gel when back hurts as well.

## 2012-02-16 NOTE — Assessment & Plan Note (Signed)
Lab Results  Component Value Date   NA 135 07/28/2011   K 5.1 07/28/2011   CL 100 07/28/2011   CO2 27 07/28/2011   BUN 27* 07/28/2011   CREATININE 1.58* 07/28/2011   CREATININE 1.99* 04/16/2010    BP Readings from Last 3 Encounters:  02/16/12 148/62  07/28/11 166/84  04/07/11 162/78    Assessment: Hypertension control:  mildly elevated  Progress toward goals: improved Barriers to meeting goals:  visual impairment  Plan: Hypertension treatment:  continue current medications Patient does not have BP log with her today - will bring at next visit.  Given that most of the pressures from her log at her last visit were wnl, we will make no changes today.  Also, today she had intially gone to the ED and checked in, and then rushed to clinic once clinic RN realized she was in the wrong place.  Will continue to monitor.

## 2012-02-16 NOTE — Assessment & Plan Note (Signed)
In 11/2010, patient's LDL=74, which is at goal of <100 given h/o CVA and DM.  Patient is taking simvastatin 40 as well as amlodipine, which interact.  Today I will d/c simvastatin 40 and start rosuvastatin 5 mg qHS.  Check lipid panel in 3 months.

## 2012-02-16 NOTE — Assessment & Plan Note (Signed)
No s/s of anemia.  No role for colonoscopy.  May consider rechecking CBC at next visit.

## 2012-02-16 NOTE — Assessment & Plan Note (Signed)
Patient reports right hand is worse than left.  Patient has full strength and sensation in b/l hands.  Hands appear grossly normal without any swelling.  No neck or back pain pain.  Will continue to monitor.Marland Kitchen

## 2012-02-17 ENCOUNTER — Telehealth: Payer: Self-pay | Admitting: Dietician

## 2012-02-17 LAB — BASIC METABOLIC PANEL
BUN: 34 mg/dL — ABNORMAL HIGH (ref 6–23)
CO2: 24 mEq/L (ref 19–32)
Glucose, Bld: 179 mg/dL — ABNORMAL HIGH (ref 70–99)
Potassium: 4.8 mEq/L (ref 3.5–5.3)
Sodium: 139 mEq/L (ref 135–145)

## 2012-02-18 NOTE — Telephone Encounter (Signed)
Scheduled for Mnt for her diabetes on Monday the 5th of august.

## 2012-02-25 ENCOUNTER — Other Ambulatory Visit: Payer: Self-pay | Admitting: *Deleted

## 2012-02-25 MED ORDER — COLCHICINE 0.6 MG PO TABS: 0.6000 mg | ORAL_TABLET | Freq: Two times a day (BID) | ORAL | Status: DC | PRN

## 2012-02-28 ENCOUNTER — Ambulatory Visit (INDEPENDENT_AMBULATORY_CARE_PROVIDER_SITE_OTHER): Payer: PRIVATE HEALTH INSURANCE | Admitting: Dietician

## 2012-02-28 ENCOUNTER — Other Ambulatory Visit: Payer: Self-pay | Admitting: *Deleted

## 2012-02-28 DIAGNOSIS — E119 Type 2 diabetes mellitus without complications: Secondary | ICD-10-CM

## 2012-02-28 MED ORDER — INDOMETHACIN 25 MG PO CAPS: 25.0000 mg | ORAL_CAPSULE | Freq: Three times a day (TID) | ORAL | Status: DC

## 2012-02-28 NOTE — Progress Notes (Signed)
Medical Nutrition Therapy:  Appt start time: 1430 end time:  [id number]. last visit with patient:   Assessment:  Primary concerns today: Blood sugar control.  78 year old woman with type 2 diabetes, A1C of 7.6% recently on glipizide and unsure about januvia dose She is hear for MNT for her diabetes which is currently at said goal of < 8.0%.  Blood sugar 189 today in office @ 3 PM with no food since 930 am today. Patient reports she forgot her diabetes pill today.  Usual eating pattern includes 3 meals and 3 snacks per day. Diet recall reveals foods that are calorically dense and nutrient poor such as sweet tea/cakes, crackers, sausage and is lacking vegetables, high quality protein and fruit.  Usual physical activity includes does not walk or do much physical activity and is willing to start. Lowest blood sugar 131, most other in past 2 weeks > 150 fasting and midday high 200s and 300s.   Progress Towards Goal(s):  In progress.   Nutritional Diagnosis:  NB-1.1 Food and nutrition-related knowledge deficit As related to lack of prior exposure to nutrtion informaion and competing values.  As evidenced by patient report. NB-2.1 Physical inactivity As related to sedentary activities and limited sight.  As evidenced by patient report of daily activities.    Intervention:   1-Nutrition education about importance of activity in diabetes control and overall health and appropriate activity and amounts for her. 2- Nutrition counseling about acceptable portions and amounts of sweet and fatty foods 3- coordination of care- clarify januvia dose  Monitoring/Evaluation:  Dietary intake, exercise, blood sugar, and body weight in 4 week(s).

## 2012-02-28 NOTE — Patient Instructions (Addendum)
Please be sure to walk every day.  Instead of [employer] sweet tea- try unsweetened tea with splenda or equal  Instead of [employer] sausage and egg biscuit- try egg biscuit  Limit sweets to once a day- eats fruit instead.  Best to limit chocolate milk to 1/2 cup 3-4 times a day- this is enough for a snack with one peanut butter cracker  Eggs are fine to eat instead of meat. Boiled eggs make great snacks, add to sandwich, eat for breakfast and eats mixed with vegetables and cheese for dinner.  Let's make a follow up in 4 weeks. I should have appointment in September 16th- 19th. Or after septmber 22nd.  Low fat chocolate milk

## 2012-03-13 ENCOUNTER — Ambulatory Visit (INDEPENDENT_AMBULATORY_CARE_PROVIDER_SITE_OTHER): Payer: PRIVATE HEALTH INSURANCE | Admitting: Pharmacist

## 2012-03-13 DIAGNOSIS — Z7901 Long term (current) use of anticoagulants: Secondary | ICD-10-CM

## 2012-03-13 DIAGNOSIS — Z86718 Personal history of other venous thrombosis and embolism: Secondary | ICD-10-CM

## 2012-03-13 LAB — POCT INR: INR: 2.6

## 2012-03-13 NOTE — Progress Notes (Signed)
Anti-Coagulation Progress Note  Chelsea Mcdaniel is a 78 y.o. female who is currently on an anti-coagulation regimen.    RECENT RESULTS: Recent results are below, the most recent result is correlated with a dose of 41.25 mg. per week: Lab Results  Component Value Date   INR 2.60 03/13/2012   INR 2.30 02/14/2012   INR 2.10 01/17/2012    ANTI-COAG DOSE:   Latest dosing instructions   Total Sun Mon Tue Wed Thu Fri Sat   41.25 3.75 mg 7.5 mg 7.5 mg 7.5 mg 3.75 mg 7.5 mg 3.75 mg    (7.5 mg0.5) (7.5 mg1) (7.5 mg1) (7.5 mg1) (7.5 mg0.5) (7.5 mg1) (7.5 mg0.5)         ANTICOAG SUMMARY: Anticoagulation Episode Summary              Current INR goal 2.0-3.0 Next INR check 04/10/2012   INR from last check 2.60 (03/13/2012)     Weekly max dose (mg)  Target end date Indefinite   Indications Long-term (current) use of anticoagulants, Personal history of DVT (deep vein thrombosis)   INR check location [facility] Preferred lab    Send INR reminders to [facility]   Comments        Provider Role Specialty Phone number   Acquanetta Chain, DO  Internal Medicine [phone]        ANTICOAG TODAY: Anticoagulation Summary as of 03/13/2012              INR goal 2.0-3.0     Selected INR 2.60 (03/13/2012) Next INR check 04/10/2012   Weekly max dose (mg)  Target end date Indefinite   Indications Long-term (current) use of anticoagulants, Personal history of DVT (deep vein thrombosis)    Anticoagulation Episode Summary              INR check location [facility] Preferred lab    Send INR reminders to [facility]   Comments        Provider Role Specialty Phone number   Acquanetta Chain, DO  Internal Medicine [phone]        PATIENT INSTRUCTIONS: Patient Instructions  Patient instructed to take medications as defined in the Anti-coagulation Track section of this encounter.  Patient instructed to take today's dose.  Patient verbalized understanding of these  instructions.        FOLLOW-UP Return in 4 weeks (on 04/10/2012) for Follow up INR at 4:00PM.  [name], III Pharm.D., CACP

## 2012-03-13 NOTE — Patient Instructions (Signed)
Patient instructed to take medications as defined in the Anti-coagulation Track section of this encounter.  Patient instructed to take today's dose.  Patient verbalized understanding of these instructions.    

## 2012-04-10 ENCOUNTER — Ambulatory Visit (INDEPENDENT_AMBULATORY_CARE_PROVIDER_SITE_OTHER): Payer: PRIVATE HEALTH INSURANCE | Admitting: Pharmacist

## 2012-04-10 DIAGNOSIS — Z86718 Personal history of other venous thrombosis and embolism: Secondary | ICD-10-CM

## 2012-04-10 DIAGNOSIS — Z7901 Long term (current) use of anticoagulants: Secondary | ICD-10-CM

## 2012-04-10 NOTE — Progress Notes (Signed)
Anti-Coagulation Progress Note  Chelsea Mcdaniel is a 78 y.o. female who is currently on an anti-coagulation regimen.    RECENT RESULTS: Recent results are below, the most recent result is correlated with a dose of 41.25 mg. per week: Lab Results  Component Value Date   INR 2.10 04/10/2012   INR 2.60 03/13/2012   INR 2.30 02/14/2012    ANTI-COAG DOSE:   Latest dosing instructions   Total Sun Mon Tue Wed Thu Fri Sat   45 3.75 mg 7.5 mg 7.5 mg 7.5 mg 7.5 mg 7.5 mg 3.75 mg    (7.5 mg0.5) (7.5 mg1) (7.5 mg1) (7.5 mg1) (7.5 mg1) (7.5 mg1) (7.5 mg0.5)         ANTICOAG SUMMARY: Anticoagulation Episode Summary              Current INR goal 2.0-3.0 Next INR check 05/08/2012   INR from last check 2.10 (04/10/2012)     Weekly max dose (mg)  Target end date Indefinite   Indications Long-term (current) use of anticoagulants, Personal history of DVT (deep vein thrombosis)   INR check location [facility] Preferred lab    Send INR reminders to [facility]   Comments        Provider Role Specialty Phone number   Acquanetta Chain, DO  Internal Medicine [phone]        ANTICOAG TODAY: Anticoagulation Summary as of 04/10/2012              INR goal 2.0-3.0     Selected INR 2.10 (04/10/2012) Next INR check 05/08/2012   Weekly max dose (mg)  Target end date Indefinite   Indications Long-term (current) use of anticoagulants, Personal history of DVT (deep vein thrombosis)    Anticoagulation Episode Summary              INR check location [facility] Preferred lab    Send INR reminders to [facility]   Comments        Provider Role Specialty Phone number   Acquanetta Chain, DO  Internal Medicine [phone]        PATIENT INSTRUCTIONS: Patient Instructions  Patient instructed to take medications as defined in the Anti-coagulation Track section of this encounter.  Patient instructed to take today's dose.  Patient verbalized understanding of these  instructions.        FOLLOW-UP Return in 4 weeks (on 05/08/2012) for Follow up INR at 3:15PM.  [name], III Pharm.D., CACP

## 2012-04-10 NOTE — Patient Instructions (Signed)
Patient instructed to take medications as defined in the Anti-coagulation Track section of this encounter.  Patient instructed to take today's dose.  Patient verbalized understanding of these instructions.    

## 2012-04-11 NOTE — Progress Notes (Signed)
Agree 

## 2012-04-24 ENCOUNTER — Other Ambulatory Visit: Payer: Self-pay | Admitting: Internal Medicine

## 2012-05-01 ENCOUNTER — Other Ambulatory Visit: Payer: Self-pay | Admitting: *Deleted

## 2012-05-01 DIAGNOSIS — I10 Essential (primary) hypertension: Secondary | ICD-10-CM

## 2012-05-01 DIAGNOSIS — E119 Type 2 diabetes mellitus without complications: Secondary | ICD-10-CM

## 2012-05-01 MED ORDER — AMLODIPINE BESYLATE 10 MG PO TABS: 10.0000 mg | ORAL_TABLET | Freq: Every day | ORAL | Status: DC

## 2012-05-01 MED ORDER — SITAGLIPTIN PHOSPHATE 100 MG PO TABS: 50.0000 mg | ORAL_TABLET | Freq: Every day | ORAL | Status: DC

## 2012-05-08 ENCOUNTER — Ambulatory Visit (INDEPENDENT_AMBULATORY_CARE_PROVIDER_SITE_OTHER): Payer: PRIVATE HEALTH INSURANCE | Admitting: Pharmacist

## 2012-05-08 DIAGNOSIS — Z7901 Long term (current) use of anticoagulants: Secondary | ICD-10-CM

## 2012-05-08 DIAGNOSIS — Z86718 Personal history of other venous thrombosis and embolism: Secondary | ICD-10-CM

## 2012-05-08 LAB — POCT INR: INR: 2.7

## 2012-05-08 NOTE — Patient Instructions (Signed)
Patient instructed to take medications as defined in the Anti-coagulation Track section of this encounter.  Patient instructed to take today's dose.  Patient verbalized understanding of these instructions.    

## 2012-05-08 NOTE — Progress Notes (Signed)
Anti-Coagulation Progress Note  Chelsea Mcdaniel is a 78 y.o. female who is currently on an anti-coagulation regimen.    RECENT RESULTS: Recent results are below, the most recent result is correlated with a dose of 45 mg. per week: Lab Results  Component Value Date   INR 2.70 05/08/2012   INR 2.10 04/10/2012   INR 2.60 03/13/2012    ANTI-COAG DOSE:   Latest dosing instructions   Total Sun Mon Tue Wed Thu Fri Sat   45 3.75 mg 7.5 mg 7.5 mg 7.5 mg 7.5 mg 7.5 mg 3.75 mg    (7.5 mg0.5) (7.5 mg1) (7.5 mg1) (7.5 mg1) (7.5 mg1) (7.5 mg1) (7.5 mg0.5)         ANTICOAG SUMMARY: Anticoagulation Episode Summary              Current INR goal 2.0-3.0 Next INR check 06/05/2012   INR from last check 2.70 (05/08/2012)     Weekly max dose (mg)  Target end date Indefinite   Indications Long-term (current) use of anticoagulants, Personal history of DVT (deep vein thrombosis)   INR check location [facility] Preferred lab    Send INR reminders to [facility]   Comments        Provider Role Specialty Phone number   Acquanetta Chain, DO  Internal Medicine [phone]        ANTICOAG TODAY: Anticoagulation Summary as of 05/08/2012              INR goal 2.0-3.0     Selected INR 2.70 (05/08/2012) Next INR check 06/05/2012   Weekly max dose (mg)  Target end date Indefinite   Indications Long-term (current) use of anticoagulants, Personal history of DVT (deep vein thrombosis)    Anticoagulation Episode Summary              INR check location [facility] Preferred lab    Send INR reminders to [facility]   Comments        Provider Role Specialty Phone number   Acquanetta Chain, DO  Internal Medicine [phone]        PATIENT INSTRUCTIONS: Patient Instructions  Patient instructed to take medications as defined in the Anti-coagulation Track section of this encounter.  Patient instructed to take today's dose.  Patient verbalized understanding of these  instructions.        FOLLOW-UP Return in 4 weeks (on 06/05/2012) for Follow up INR at 2:30PM.  [name], III Pharm.D., CACP

## 2012-05-08 NOTE — Progress Notes (Signed)
Agree with Dr. Gladstone Pih Assessment/plan for this patient.

## 2012-05-17 ENCOUNTER — Encounter: Payer: Self-pay | Admitting: Internal Medicine

## 2012-05-17 ENCOUNTER — Ambulatory Visit (INDEPENDENT_AMBULATORY_CARE_PROVIDER_SITE_OTHER): Payer: PRIVATE HEALTH INSURANCE | Admitting: Internal Medicine

## 2012-05-17 VITALS — BP 152/74 | HR 78 | Temp 98.2°F | Ht 64.0 in | Wt 166.6 lb

## 2012-05-17 DIAGNOSIS — Z79899 Other long term (current) drug therapy: Secondary | ICD-10-CM

## 2012-05-17 DIAGNOSIS — I10 Essential (primary) hypertension: Secondary | ICD-10-CM

## 2012-05-17 DIAGNOSIS — E119 Type 2 diabetes mellitus without complications: Secondary | ICD-10-CM

## 2012-05-17 DIAGNOSIS — Z7901 Long term (current) use of anticoagulants: Secondary | ICD-10-CM

## 2012-05-17 LAB — GLUCOSE, CAPILLARY: Glucose-Capillary: 194 mg/dL — ABNORMAL HIGH (ref 70–99)

## 2012-05-17 MED ORDER — METOPROLOL SUCCINATE 12.5 MG HALF TABLET: 12.5000 mg | ORAL_TABLET | Freq: Every day | ORAL | Status: DC

## 2012-05-17 NOTE — Progress Notes (Signed)
Subjective:   Patient ID: Chelsea Mcdaniel female   DOB: 1928-05-10 78 y.o.   MRN: [id number]  HPI: Chelsea Mcdaniel is a pleasant  78 y.o. woman with history of DM, HTN, HLD, CVA and CKD that presents for routine follow up.  She denies any complaints at this time.  She is feeling well and has been working on her DM by improving her diet & tries to walk more.  She reports a few episodes of feeling "funny" or lightheaded, that resolves after she eats something, but did not take her blood sugar during these episodes.  She denies CP/SOB/HA.  She denies recent falls.     Past Medical History  Diagnosis Date  . Diabetes mellitus   . Hyperlipidemia   . Hypertension   . CVA (cerebrovascular accident)  October 2007     CT of the head atrophy with multiple remote insults noted but no definite acute findings  . Blindness of left eye      likely related to stroke, left cataract removed from that eye with complications  . Anemia      normocytic anemia with baseline hemoglobin 10-11  . Chronic kidney disease 2006     left renal artery stenosis with probable hemodynamic significance, kidneys are normal in morphology without focal lesions or hydronephrosis this is based but cannot A. of the abdomen with and without contrast done on the 31st 2006   Current Outpatient Prescriptions  Medication Sig Dispense Refill  . amLODipine (NORVASC) 10 MG tablet Take 1 tablet (10 mg total) by mouth daily. For blood pressure  30 tablet  11  . benazepril (LOTENSIN) 40 MG tablet Take 1 tablet (40 mg total) by mouth daily.  90 tablet  3  . benazepril (LOTENSIN) 40 MG tablet TAKE ONE TABLET BY MOUTH EVERY DAY  90 tablet  0  . Calcium-Vitamin D (CHEWABLE CALCIUM/D) 300 MG CHEW Chew by mouth. Take as directed       . colchicine (COLCRYS) 0.6 MG tablet Take 1 tablet (0.6 mg total) by mouth 2 (two) times daily as needed. For gout flare  30 tablet  11  . colchicine 0.6 MG tablet Take 1 tablet (0.6 mg total) by mouth 2 (two)  times daily as needed. For gout flare up  30 tablet  1  . diclofenac sodium (VOLTAREN) 1 % GEL Apply 1 application topically 2 (two) times daily. To knees  1 Tube  5  . furosemide (LASIX) 40 MG tablet Take 1 tablet (40 mg total) by mouth daily.  30 tablet  0  . furosemide (LASIX) 40 MG tablet Take 1 tablet (40 mg total) by mouth daily.  90 tablet  3  . glipiZIDE (GLUCOTROL XL) 10 MG 24 hr tablet Take 1 tablet (10 mg total) by mouth daily.  90 tablet  3  . indomethacin (INDOCIN) 25 MG capsule Take 1 capsule (25 mg total) by mouth 3 (three) times daily. For 5 days  90 capsule  3  . rosuvastatin (CRESTOR) 5 MG tablet Take 1 tablet (5 mg total) by mouth at bedtime.  90 tablet  3  . simvastatin (ZOCOR) 40 MG tablet Take 1 tablet (40 mg total) by mouth at bedtime.  30 tablet  0  . sitaGLIPtin (JANUVIA) 100 MG tablet Take 0.5 tablets (50 mg total) by mouth daily.  15 tablet  11  . warfarin (COUMADIN) 7.5 MG tablet Take as directed by anticoagulation clinic provider.  60 tablet  11   Family  History  Problem Relation Age of Onset  . Heart disease Mother   . Diabetes Mother   . Hyperlipidemia Mother   . Hypertension Mother   . Heart disease Father   . Diabetes Father   . Hyperlipidemia Father   . Hypertension Father    History   Social History  . Marital Status: Widowed    Spouse Name: N/A    Number of Children: N/A  . Years of Education: N/A   Social History Main Topics  . Smoking status: Never Smoker   . Smokeless tobacco: None  . Alcohol Use: No  . Drug Use: No  . Sexually Active: None   Other Topics Concern  . None   Social History Narrative    Patient is a widow. She has 11 children 5 of who are living. She is retired in 1993 from [employer]. She denies tobacco alcohol or drug use.   Review of Systems: Constitutional: Denies fever, chills, diaphoresis, appetite change and fatigue.  HEENT: Denies photophobia, eye pain, redness, hearing loss, ear pain, congestion, sore  throat, rhinorrhea, sneezing, mouth sores, trouble swallowing, neck pain, neck stiffness and tinnitus.  Respiratory: Denies SOB, DOE, cough, chest tightness, and wheezing.  Cardiovascular: Denies chest pain, palpitations and leg swelling.  Gastrointestinal: Denies nausea, vomiting, abdominal pain, diarrhea, constipation, blood in stool and abdominal distention.  Genitourinary: Denies dysuria, urgency, frequency, hematuria, flank pain and difficulty urinating.  Musculoskeletal: Denies myalgias and gait problem.  +joint pain in hands  Skin: Denies pallor, rash and wound.  Neurological: Denies seizures, syncope, weakness,numbness and headaches.  Psychiatric/Behavioral: Denies suicidal ideation, mood changes, confusion, nervousness, sleep disturbance and agitation    Objective:  Physical Exam: Filed Vitals:   05/17/12 1551  BP: 152/74  Pulse: 78  Temp: 98.2 F (36.8 C)  TempSrc: Oral  Height: 5\' 4"  (1.626 m)  Weight: 166 lb 9.6 oz (75.569 kg)  SpO2: 97%   Constitutional: Vital signs reviewed. Patient is a well-developed and well-nourished woman in no acute distress and cooperative with exam.  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 Non-tender, non-distended, bowel sounds are normal, no masses, organomegaly, or guarding present.  Musculoskeletal: good strength, sensation & ROM in b/l hands Neurological: A&O x3, Strength is normal and symmetric bilaterally, cranial nerve II-XII are grossly intact, no focal motor deficit, sensory intact to light touch bilaterally.  Skin: Warm, dry and intact. No rash, cyanosis, or clubbing.  Psychiatric: Normal mood and affect. speech and behavior is normal. Judgment and thought content normal.  Cognition and memory are normal.    Assessment & Plan:  Case and care discussed with Dr. Ellwood Dense. Please see problem oriented charting for further details. Patient to return in 3-6 months for routine follow up.

## 2012-05-17 NOTE — Patient Instructions (Signed)
-  You may discontinue Januvia  -Start metoprolol 12.5 mg (half tablet) daily, if you have any side effects such as dizziness, heart palpitations, or fatigue, please call the clinic.  Please be sure to bring all of your medications with you to every visit.  Should you have any new or worsening symptoms, please be sure to call the clinic at [phone].

## 2012-05-18 NOTE — Assessment & Plan Note (Signed)
At this point, she has had no decline in functional status, and it is reasonable to continue anticoagulation.  Will reassess at each follow up.

## 2012-05-18 NOTE — Assessment & Plan Note (Signed)
Lab Results  Component Value Date   HGBA1C 6.9 05/17/2012   HGBA1C 7.2 04/16/2010   CREATININE 1.83* 02/16/2012   CREATININE 1.99* 04/16/2010   MICROALBUR 23.75* 07/28/2011   MICRALBCREAT 195.2* 07/28/2011   CHOL 133 12/10/2010   HDL 38* 12/10/2010   TRIG 104 12/10/2010    Last eye exam and foot exam:    Component Value Date/Time   HMDIABEYEEXA mild non-proliferative diabetic retinopathy;glaucoma mils 10/01/2008   HMDIABFOOTEX done 04/16/2010    Assessment: Diabetes control: controlled Progress toward goals: at goal Barriers to meeting goals: no barriers identified  Plan: Diabetes treatment:  Continue Glipizide 10; in setting of reported likely hypoglycemia and goal A1c<8, as well as addition of low dose beta blocker, will discontinue jaunuvia 50mg  daily and monitor. Refer to: none Instruction/counseling given: reminded to bring blood glucose meter & log to each visit and reminded to bring medications to each visit; She is followed regularly by Dr. Ricki Miller for open angle glaucoma

## 2012-05-18 NOTE — Assessment & Plan Note (Addendum)
Lab Results  Component Value Date   NA 139 02/16/2012   K 4.8 02/16/2012   CL 104 02/16/2012   CO2 24 02/16/2012   BUN 34* 02/16/2012   CREATININE 1.83* 02/16/2012   CREATININE 1.99* 04/16/2010    BP Readings from Last 3 Encounters:  05/17/12 152/74  02/16/12 148/62  07/28/11 166/84    Assessment: Hypertension control:  mildly elevated  Progress toward goals:  unchanged Barriers to meeting goals:  no barriers identified  Plan: Hypertension treatment: Patient is already on lasix 40, amlodipine 10 and benazepril 40; she prefers not to return for 6 months.  I have she is maxed out on amlodipine and benazepril.  Her HR is 78, and can tolerate betablockade.  I will have her start metoprolol XL 12.5mg  daily and she promises to return sooner if she feels ANY side effects (including but not limited to dizziness, fatigue, or diarrhea).  With initiation of this medication, I am not as concerned about monitoring her electrolytes.

## 2012-06-05 ENCOUNTER — Ambulatory Visit (INDEPENDENT_AMBULATORY_CARE_PROVIDER_SITE_OTHER): Payer: PRIVATE HEALTH INSURANCE | Admitting: Pharmacist

## 2012-06-05 DIAGNOSIS — Z86718 Personal history of other venous thrombosis and embolism: Secondary | ICD-10-CM

## 2012-06-05 DIAGNOSIS — Z7901 Long term (current) use of anticoagulants: Secondary | ICD-10-CM

## 2012-06-05 LAB — POCT INR: INR: 3.7

## 2012-06-05 NOTE — Progress Notes (Signed)
Anti-Coagulation Progress Note  Chelsea Mcdaniel is a 78 y.o. female who is currently on an anti-coagulation regimen.    RECENT RESULTS: Recent results are below, the most recent result is correlated with a dose of 45 mg. per week: Lab Results  Component Value Date   INR 3.70 06/05/2012   INR 2.70 05/08/2012   INR 2.10 04/10/2012    ANTI-COAG DOSE:   Latest dosing instructions   Total Sun Mon Tue Wed Thu Fri Sat   41.25 7.5 mg 3.75 mg 7.5 mg 3.75 mg 7.5 mg 3.75 mg 7.5 mg    (7.5 mg1) (7.5 mg0.5) (7.5 mg1) (7.5 mg0.5) (7.5 mg1) (7.5 mg0.5) (7.5 mg1)         ANTICOAG SUMMARY: Anticoagulation Episode Summary              Current INR goal 2.0-3.0 Next INR check 06/19/2012   INR from last check 3.70! (06/05/2012)     Weekly max dose (mg)  Target end date Indefinite   Indications Long-term (current) use of anticoagulants [V58.61], Personal history of DVT (deep vein thrombosis) [V12.51]   INR check location [facility] Preferred [facility]    Send INR reminders to [facility]   Comments        Provider Role Specialty Phone number   Acquanetta Chain, DO  Internal Medicine [phone]        ANTICOAG TODAY: Anticoagulation Summary as of 06/05/2012              INR goal 2.0-3.0     Selected INR 3.70! (06/05/2012) Next INR check 06/19/2012   Weekly max dose (mg)  Target end date Indefinite   Indications Long-term (current) use of anticoagulants [V58.61], Personal history of DVT (deep vein thrombosis) [V12.51]    Anticoagulation Episode Summary              INR check location [facility] Preferred lab    Send INR reminders to [facility]   Comments        Provider Role Specialty Phone number   Acquanetta Chain, DO  Internal Medicine [phone]        PATIENT INSTRUCTIONS: Patient Instructions  Patient instructed to take medications as defined in the Anti-coagulation Track section of this encounter.  Patient instructed to take today's dose.  Patient  verbalized understanding of these instructions.        FOLLOW-UP Return in 2 weeks (on 06/19/2012) for Follow up INR at [place], III Pharm.D., CACP

## 2012-06-05 NOTE — Patient Instructions (Signed)
Patient instructed to take medications as defined in the Anti-coagulation Track section of this encounter.  Patient instructed to take today's dose.  Patient verbalized understanding of these instructions.    

## 2012-06-19 ENCOUNTER — Ambulatory Visit (INDEPENDENT_AMBULATORY_CARE_PROVIDER_SITE_OTHER): Payer: PRIVATE HEALTH INSURANCE | Admitting: Pharmacist

## 2012-06-19 DIAGNOSIS — Z7901 Long term (current) use of anticoagulants: Secondary | ICD-10-CM

## 2012-06-19 DIAGNOSIS — Z86718 Personal history of other venous thrombosis and embolism: Secondary | ICD-10-CM

## 2012-06-19 NOTE — Progress Notes (Signed)
Anti-Coagulation Progress Note  Nigel KAYELYN MATTIA is a 78 y.o. female who is currently on an anti-coagulation regimen.    RECENT RESULTS: Recent results are below, the most recent result is correlated with a dose of 41.25 mg. per week: Lab Results  Component Value Date   INR 2.50 06/19/2012   INR 3.70 06/05/2012   INR 2.70 05/08/2012    ANTI-COAG DOSE:   Latest dosing instructions   Total Sun Mon Tue Wed Thu Fri Sat   41.25 7.5 mg 3.75 mg 7.5 mg 3.75 mg 7.5 mg 3.75 mg 7.5 mg    (7.5 mg1) (7.5 mg0.5) (7.5 mg1) (7.5 mg0.5) (7.5 mg1) (7.5 mg0.5) (7.5 mg1)         ANTICOAG SUMMARY: Anticoagulation Episode Summary              Current INR goal 2.0-3.0 Next INR check 07/24/2012   INR from last check 2.50 (06/19/2012)     Weekly max dose (mg)  Target end date Indefinite   Indications Long-term (current) use of anticoagulants [V58.61], Personal history of DVT (deep vein thrombosis) [V12.51]   INR check location [facility] Preferred lab    Send INR reminders to [facility]   Comments        Provider Role Specialty Phone number   Acquanetta Chain, DO  Internal Medicine [phone]        ANTICOAG TODAY: Anticoagulation Summary as of 06/19/2012              INR goal 2.0-3.0     Selected INR 2.50 (06/19/2012) Next INR check 07/24/2012   Weekly max dose (mg)  Target end date Indefinite   Indications Long-term (current) use of anticoagulants [V58.61], Personal history of DVT (deep vein thrombosis) [V12.51]    Anticoagulation Episode Summary              INR check location [facility] Preferred lab    Send INR reminders to [facility]   Comments        Provider Role Specialty Phone number   Acquanetta Chain, DO  Internal Medicine [phone]        PATIENT INSTRUCTIONS: Patient Instructions  Patient instructed to take medications as defined in the Anti-coagulation Track section of this encounter.  Patient instructed to take today's dose.    Patient verbalized understanding of these instructions.        FOLLOW-UP Return in 5 weeks (on 07/24/2012) for Follow up INR at [place], III Pharm.D., CACP

## 2012-06-19 NOTE — Patient Instructions (Signed)
Patient instructed to take medications as defined in the Anti-coagulation Track section of this encounter.  Patient instructed to take today's dose.  Patient verbalized understanding of these instructions.    

## 2012-07-24 ENCOUNTER — Ambulatory Visit (INDEPENDENT_AMBULATORY_CARE_PROVIDER_SITE_OTHER): Payer: PRIVATE HEALTH INSURANCE | Admitting: Pharmacist

## 2012-07-24 DIAGNOSIS — Z7901 Long term (current) use of anticoagulants: Secondary | ICD-10-CM

## 2012-07-24 DIAGNOSIS — Z86718 Personal history of other venous thrombosis and embolism: Secondary | ICD-10-CM

## 2012-07-24 NOTE — Patient Instructions (Signed)
Patient instructed to take medications as defined in the Anti-coagulation Track section of this encounter.  Patient instructed to take today's dose.  Patient verbalized understanding of these instructions.    

## 2012-07-24 NOTE — Progress Notes (Signed)
Anti-Coagulation Progress Note  Chelsea Mcdaniel is a 78 y.o. female who is currently on an anti-coagulation regimen.    RECENT RESULTS: Recent results are below, the most recent result is correlated with a dose of 41.25 mg. per week: Lab Results  Component Value Date   INR 1.80 07/24/2012   INR 2.50 06/19/2012   INR 3.70 06/05/2012    ANTI-COAG DOSE:   Latest dosing instructions   Total Sun Mon Tue Wed Thu Fri Sat   45 7.5 mg 3.75 mg 7.5 mg 7.5 mg 3.75 mg 7.5 mg 7.5 mg    (7.5 mg1) (7.5 mg0.5) (7.5 mg1) (7.5 mg1) (7.5 mg0.5) (7.5 mg1) (7.5 mg1)         ANTICOAG SUMMARY: Anticoagulation Episode Summary              Current INR goal 2.0-3.0 Next INR check 08/21/2012   INR from last check 1.80! (07/24/2012)     Weekly max dose (mg)  Target end date Indefinite   Indications Long-term (current) use of anticoagulants [V58.61], Personal history of DVT (deep vein thrombosis) [V12.51]   INR check location [facility] Preferred lab    Send INR reminders to [facility]   Comments        Provider Role Specialty Phone number   Acquanetta Chain, DO  Internal Medicine [phone]        ANTICOAG TODAY: Anticoagulation Summary as of 07/24/2012              INR goal 2.0-3.0     Selected INR 1.80! (07/24/2012) Next INR check 08/21/2012   Weekly max dose (mg)  Target end date Indefinite   Indications Long-term (current) use of anticoagulants [V58.61], Personal history of DVT (deep vein thrombosis) [V12.51]    Anticoagulation Episode Summary              INR check location [facility] Preferred lab    Send INR reminders to [facility]   Comments        Provider Role Specialty Phone number   Acquanetta Chain, DO  Internal Medicine [phone]        PATIENT INSTRUCTIONS: Patient Instructions  Patient instructed to take medications as defined in the Anti-coagulation Track section of this encounter.  Patient instructed to take today's dose.  Patient  verbalized understanding of these instructions.        FOLLOW-UP Return in 4 weeks (on 08/21/2012) for Follow up INR at [place], III Pharm.D., CACP

## 2012-08-02 ENCOUNTER — Other Ambulatory Visit: Payer: Self-pay | Admitting: *Deleted

## 2012-08-03 MED ORDER — BENAZEPRIL HCL 40 MG PO TABS: 40.0000 mg | ORAL_TABLET | Freq: Every day | ORAL | Status: DC

## 2012-08-21 ENCOUNTER — Ambulatory Visit (INDEPENDENT_AMBULATORY_CARE_PROVIDER_SITE_OTHER): Payer: PRIVATE HEALTH INSURANCE | Admitting: Pharmacist

## 2012-08-21 DIAGNOSIS — Z86718 Personal history of other venous thrombosis and embolism: Secondary | ICD-10-CM

## 2012-08-21 DIAGNOSIS — Z7901 Long term (current) use of anticoagulants: Secondary | ICD-10-CM

## 2012-08-21 LAB — POCT INR: INR: 3.9

## 2012-08-21 NOTE — Progress Notes (Signed)
Anti-Coagulation Progress Note  Chelsea Mcdaniel is a 79 y.o. female who is currently on an anti-coagulation regimen.    RECENT RESULTS: Recent results are below, the most recent result is correlated with a dose of 45 mg. per week: Lab Results  Component Value Date   INR 3.90 08/21/2012   INR 1.80 07/24/2012   INR 2.50 06/19/2012    ANTI-COAG DOSE:   Latest dosing instructions   Total Sun Mon Tue Wed Thu Fri Sat   41.25 7.5 mg 3.75 mg 7.5 mg 3.75 mg 7.5 mg 3.75 mg 7.5 mg    (7.5 mg1) (7.5 mg0.5) (7.5 mg1) (7.5 mg0.5) (7.5 mg1) (7.5 mg0.5) (7.5 mg1)         ANTICOAG SUMMARY: Anticoagulation Episode Summary              Current INR goal 2.0-3.0 Next INR check 09/11/2012   INR from last check 3.90! (08/21/2012)     Weekly max dose (mg)  Target end date Indefinite   Indications Long-term (current) use of anticoagulants [V58.61], Personal history of DVT (deep vein thrombosis) [V12.51]   INR check location [facility] Preferred lab    Send INR reminders to [facility]   Comments        Provider Role Specialty Phone number   Acquanetta Chain, DO  Internal Medicine [phone]        ANTICOAG TODAY: Anticoagulation Summary as of 08/21/2012              INR goal 2.0-3.0     Selected INR 3.90! (08/21/2012) Next INR check 09/11/2012   Weekly max dose (mg)  Target end date Indefinite   Indications Long-term (current) use of anticoagulants [V58.61], Personal history of DVT (deep vein thrombosis) [V12.51]    Anticoagulation Episode Summary              INR check location [facility] Preferred lab    Send INR reminders to [facility]   Comments        Provider Role Specialty Phone number   Acquanetta Chain, DO  Internal Medicine [phone]        PATIENT INSTRUCTIONS: Patient Instructions  Patient instructed to take medications as defined in the Anti-coagulation Track section of this encounter.  Patient instructed to take today's dose.  Patient  verbalized understanding of these instructions.        FOLLOW-UP Return in about 3 weeks (around 09/11/2012) for Follow up INR at 2:15PM.  [name], III Pharm.D., CACP

## 2012-08-21 NOTE — Patient Instructions (Signed)
Patient instructed to take medications as defined in the Anti-coagulation Track section of this encounter.  Patient instructed to take today's dose.  Patient verbalized understanding of these instructions.    

## 2012-08-25 ENCOUNTER — Other Ambulatory Visit: Payer: Self-pay | Admitting: *Deleted

## 2012-08-25 DIAGNOSIS — I10 Essential (primary) hypertension: Secondary | ICD-10-CM

## 2012-08-25 MED ORDER — AMLODIPINE BESYLATE 10 MG PO TABS: 10.0000 mg | ORAL_TABLET | Freq: Every day | ORAL | Status: DC

## 2012-09-11 ENCOUNTER — Ambulatory Visit (INDEPENDENT_AMBULATORY_CARE_PROVIDER_SITE_OTHER): Payer: PRIVATE HEALTH INSURANCE | Admitting: Pharmacist

## 2012-09-11 DIAGNOSIS — Z7901 Long term (current) use of anticoagulants: Secondary | ICD-10-CM

## 2012-09-11 DIAGNOSIS — Z86718 Personal history of other venous thrombosis and embolism: Secondary | ICD-10-CM

## 2012-09-11 NOTE — Progress Notes (Signed)
Anti-Coagulation Progress Note  Chelsea Mcdaniel is a 79 y.o. female who is currently on an anti-coagulation regimen.    RECENT RESULTS: Recent results are below, the most recent result is correlated with a dose of 41.25 mg. per week: Lab Results  Component Value Date   INR 4.50 09/11/2012   INR 3.90 08/21/2012   INR 1.80 07/24/2012    ANTI-COAG DOSE: Anticoagulation Dose Instructions as of 09/11/2012     Sun Mon Tue Wed Thu Fri Sat   New Dose 7.5 mg 3.75 mg 3.75 mg 3.75 mg 7.5 mg 3.75 mg 3.75 mg       ANTICOAG SUMMARY: Anticoagulation Episode Summary   Current INR goal 2.0-3.0  Next INR check 09/25/2012  INR from last check 4.50! (09/11/2012)  Weekly max dose   Target end date Indefinite  [facility] check location [facility]  Preferred lab   Send INR reminders to [facility]   Indications  Long-term (current) use of anticoagulants [V58.61] Personal history of DVT (deep vein thrombosis) [V12.51]        Comments       Anticoagulation Care Providers   Provider Role Specialty Phone number   Acquanetta Chain, DO  Internal Medicine [phone]      ANTICOAG TODAY: Anticoagulation Summary as of 09/11/2012   INR goal 2.0-3.0  Selected INR 4.50! (09/11/2012)  Next INR check 09/25/2012  Target end date Indefinite   Indications  Long-term (current) use of anticoagulants [V58.61] Personal history of DVT (deep vein thrombosis) [V12.51]      Anticoagulation Episode Summary   [facility] check location [facility]   Preferred lab    Send INR reminders to [facility]   Comments     Anticoagulation Care Providers   Provider Role Specialty Phone number   Acquanetta Chain, DO  Internal Medicine [phone]      PATIENT INSTRUCTIONS: Patient Instructions  Patient instructed to take medications as defined in the Anti-coagulation Track section of this encounter.  Patient instructed to OMIT/HOLD today's dose.  Patient verbalized understanding of these instructions.        FOLLOW-UP Return in 2 weeks (on 09/25/2012) for Follow up INR at 2:45PM.  [name], III Pharm.D., CACP

## 2012-09-11 NOTE — Patient Instructions (Signed)
Patient instructed to take medications as defined in the Anti-coagulation Track section of this encounter.  Patient instructed to OMIT/HOLD today's dose.  Patient verbalized understanding of these instructions.    

## 2012-09-25 ENCOUNTER — Ambulatory Visit: Payer: PRIVATE HEALTH INSURANCE

## 2012-09-26 ENCOUNTER — Ambulatory Visit: Payer: PRIVATE HEALTH INSURANCE

## 2012-10-02 ENCOUNTER — Ambulatory Visit (INDEPENDENT_AMBULATORY_CARE_PROVIDER_SITE_OTHER): Payer: PRIVATE HEALTH INSURANCE | Admitting: Pharmacist

## 2012-10-02 DIAGNOSIS — Z7901 Long term (current) use of anticoagulants: Secondary | ICD-10-CM

## 2012-10-02 DIAGNOSIS — Z86718 Personal history of other venous thrombosis and embolism: Secondary | ICD-10-CM

## 2012-10-02 MED ORDER — WARFARIN SODIUM 7.5 MG PO TABS: ORAL_TABLET | ORAL | Status: DC

## 2012-10-02 NOTE — Addendum Note (Signed)
Addended by: [name] B on: 10/02/2012 04:22 PM   Modules accepted: Orders

## 2012-10-02 NOTE — Patient Instructions (Signed)
Patient instructed to take medications as defined in the Anti-coagulation Track section of this encounter.  Patient instructed to take today's dose.  Patient verbalized understanding of these instructions.    

## 2012-10-02 NOTE — Progress Notes (Signed)
Anti-Coagulation Progress Note  Chelsea Mcdaniel is a 79 y.o. female who is currently on an anti-coagulation regimen.    RECENT RESULTS: Recent results are below, the most recent result is correlated with a dose of 33.75 mg. per week: Lab Results  Component Value Date   INR 3.40 10/02/2012   INR 4.50 09/11/2012   INR 3.90 08/21/2012    ANTI-COAG DOSE: Anticoagulation Dose Instructions as of 10/02/2012     Sun Mon Tue Wed Thu Fri Sat   New Dose 7.5 mg 3.75 mg 3.75 mg 3.75 mg 3.75 mg 3.75 mg 3.75 mg       ANTICOAG SUMMARY: Anticoagulation Episode Summary   Current INR goal 2.0-3.0  Next INR check 10/23/2012  INR from last check 3.40! (10/02/2012)  Weekly max dose   Target end date Indefinite  [facility] check location [facility]  Preferred lab   Send INR reminders to [facility]   Indications  Long-term (current) use of anticoagulants [V58.61] Personal history of DVT (deep vein thrombosis) [V12.51]        Comments       Anticoagulation Care Providers   Provider Role Specialty Phone number   Acquanetta Chain, DO  Internal Medicine [phone]      ANTICOAG TODAY: Anticoagulation Summary as of 10/02/2012   INR goal 2.0-3.0  Selected INR 3.40! (10/02/2012)  Next INR check 10/23/2012  Target end date Indefinite   Indications  Long-term (current) use of anticoagulants [V58.61] Personal history of DVT (deep vein thrombosis) [V12.51]      Anticoagulation Episode Summary   [facility] check location [facility]   Preferred lab    Send INR reminders to [facility]   Comments     Anticoagulation Care Providers   Provider Role Specialty Phone number   Acquanetta Chain, DO  Internal Medicine [phone]      PATIENT INSTRUCTIONS: Patient Instructions  Patient instructed to take medications as defined in the Anti-coagulation Track section of this encounter.  Patient instructed to take today's dose.  Patient verbalized understanding of these instructions.        FOLLOW-UP Return in 3 weeks (on 10/23/2012) for Follow up INR at 3:15PM.  [name], III Pharm.D., CACP

## 2012-10-03 NOTE — Progress Notes (Signed)
Dr. Gladstone Pih assessment and plan were reviewed and I agree with his documented plan.

## 2012-10-04 ENCOUNTER — Ambulatory Visit (INDEPENDENT_AMBULATORY_CARE_PROVIDER_SITE_OTHER): Payer: PRIVATE HEALTH INSURANCE | Admitting: Internal Medicine

## 2012-10-04 ENCOUNTER — Encounter: Payer: Self-pay | Admitting: Internal Medicine

## 2012-10-04 VITALS — BP 130/61 | HR 64 | Temp 97.0°F | Wt 164.2 lb

## 2012-10-04 DIAGNOSIS — I10 Essential (primary) hypertension: Secondary | ICD-10-CM

## 2012-10-04 DIAGNOSIS — Z79899 Other long term (current) drug therapy: Secondary | ICD-10-CM

## 2012-10-04 DIAGNOSIS — Z7901 Long term (current) use of anticoagulants: Secondary | ICD-10-CM

## 2012-10-04 DIAGNOSIS — E119 Type 2 diabetes mellitus without complications: Secondary | ICD-10-CM

## 2012-10-04 DIAGNOSIS — M19049 Primary osteoarthritis, unspecified hand: Secondary | ICD-10-CM

## 2012-10-04 DIAGNOSIS — E785 Hyperlipidemia, unspecified: Secondary | ICD-10-CM

## 2012-10-04 LAB — BASIC METABOLIC PANEL
CO2: 27 mEq/L (ref 19–32)
Calcium: 9.2 mg/dL (ref 8.4–10.5)
Sodium: 136 mEq/L (ref 135–145)

## 2012-10-04 LAB — LIPID PANEL
HDL: 44 mg/dL (ref >39)
Total CHOL/HDL Ratio: 3.1 Ratio

## 2012-10-04 LAB — GLUCOSE, CAPILLARY: Glucose-Capillary: 262 mg/dL — ABNORMAL HIGH (ref 70–99)

## 2012-10-04 MED ORDER — SITAGLIPTIN PHOSPHATE 50 MG PO TABS: 50.0000 mg | ORAL_TABLET | Freq: Every day | ORAL | Status: DC

## 2012-10-04 NOTE — Assessment & Plan Note (Signed)
Continues to c/o bilateral hand pain, most likely secondary to osteoarthritis. Would benefit from PT/OT to help her with picking up objects/buttoning her clothes etc.

## 2012-10-04 NOTE — Progress Notes (Signed)
Subjective:   Patient ID: Chelsea Mcdaniel female   DOB: March 07, 1928 79 y.o.   MRN: [id number]  HPI: Chelsea Mcdaniel is a 79 y.o. woman with history of DM, HTN, HLD, CVA and CKD that presents for routine follow up.   She denies any complaints at this time. She is feeling well and continues to work on her DM by improving her diet & tries to walk more. She denies sx of hypoglycemia today, such as feeling lightheaded or with diaphoresis.  She denies CP/SOB/HA. She denies recent falls.    Past Medical History  Diagnosis Date  . Diabetes mellitus   . Hyperlipidemia   . Hypertension   . CVA (cerebrovascular accident)  October 2007     CT of the head atrophy with multiple remote insults noted but no definite acute findings  . Blindness of left eye      likely related to stroke, left cataract removed from that eye with complications  . Anemia      normocytic anemia with baseline hemoglobin 10-11  . Chronic kidney disease 2006     left renal artery stenosis with probable hemodynamic significance, kidneys are normal in morphology without focal lesions or hydronephrosis this is based but cannot A. of the abdomen with and without contrast done on the 31st 2006   Current Outpatient Prescriptions  Medication Sig Dispense Refill  . amLODipine (NORVASC) 10 MG tablet Take 1 tablet (10 mg total) by mouth daily. For blood pressure  90 tablet  1  . benazepril (LOTENSIN) 40 MG tablet Take 1 tablet (40 mg total) by mouth daily.  90 tablet  0  . Calcium-Vitamin D (CHEWABLE CALCIUM/D) 300 MG CHEW Chew by mouth. Take as directed       . colchicine 0.6 MG tablet Take 1 tablet (0.6 mg total) by mouth 2 (two) times daily as needed. For gout flare up  30 tablet  1  . diclofenac sodium (VOLTAREN) 1 % GEL Apply 1 application topically 2 (two) times daily. To knees  1 Tube  5  . furosemide (LASIX) 40 MG tablet Take 1 tablet (40 mg total) by mouth daily.  90 tablet  3  . glipiZIDE (GLUCOTROL XL) 10 MG 24 hr tablet  Take 1 tablet (10 mg total) by mouth daily.  90 tablet  3  . indomethacin (INDOCIN) 25 MG capsule Take 1 capsule (25 mg total) by mouth 3 (three) times daily. For 5 days  90 capsule  3  . metoprolol succinate (TOPROL XL) 12.5 mg TB24 Take 0.5 tablets (12.5 mg total) by mouth daily.  30 tablet  11  . rosuvastatin (CRESTOR) 5 MG tablet Take 1 tablet (5 mg total) by mouth at bedtime.  90 tablet  3  . warfarin (COUMADIN) 7.5 MG tablet Take as directed by anticoagulation clinic provider.  60 tablet  11   No current facility-administered medications for this visit.   Family History  Problem Relation Age of Onset  . Heart disease Mother   . Diabetes Mother   . Hyperlipidemia Mother   . Hypertension Mother   . Heart disease Father   . Diabetes Father   . Hyperlipidemia Father   . Hypertension Father    History   Social History  . Marital Status: Widowed    Spouse Name: N/A    Number of Children: N/A  . Years of Education: N/A   Social History Main Topics  . Smoking status: Never Smoker   . Smokeless tobacco: Not  on file  . Alcohol Use: No  . Drug Use: No  . Sexually Active: Not on file   Other Topics Concern  . Not on file   Social History Narrative    Patient is a widow. She has 11 children 5 of who are living. She is retired in 1993 from [employer]. She denies tobacco alcohol or drug use.   Review of Systems: Constitutional: Denies fever, chills, diaphoresis, appetite change and fatigue.  HEENT: Denies photophobia, eye pain, redness, hearing loss, ear pain, congestion, sore throat, rhinorrhea, sneezing, mouth sores, trouble swallowing, neck pain, neck stiffness and tinnitus.   Respiratory: Denies SOB, DOE, cough, chest tightness,  and wheezing.   Cardiovascular: Denies chest pain, palpitations and leg swelling.  Gastrointestinal: Denies nausea, vomiting, abdominal pain, diarrhea, constipation, blood in stool and abdominal distention.  Genitourinary: Denies dysuria, urgency,  frequency, hematuria, flank pain and difficulty urinating.  Musculoskeletal: Denies myalgias and gait problem. Chronic b/l joint pain in hands Skin: Denies pallor, rash and wound.  Neurological: Denies dizziness, seizures, syncope, weakness, light-headedness, numbness and headaches.  Psychiatric/Behavioral: Denies suicidal ideation, mood changes, confusion, nervousness, sleep disturbance and agitation  Objective:  Physical Exam: Filed Vitals:   10/04/12 1511  BP: 130/61  Pulse: 64  Temp: 97 F (36.1 C)  TempSrc: Oral  Weight: 164 lb 3.2 oz (74.481 kg)  SpO2: 95%   Constitutional: Vital signs reviewed.  Patient is a well-developed and well-nourished woman in no acute distress and cooperative with exam.  Head: Normocephalic and atraumatic Mouth: no erythema or exudates, MMM Eyes: Left eye not reactive to light (chronically blind in left eye)  Neck: Supple, Trachea midline normal ROM, No JVD, mass, thyromegaly, or carotid bruit present.  Cardiovascular: RRR, S1 normal, S2 normal, no MRG, pulses symmetric and intact bilaterally Pulmonary/Chest: CTAB, no wheezes, rales, or rhonchi Abdominal: Soft. Non-tender, non-distended, bowel sounds are normal, no masses, organomegaly, or guarding present.  Musculoskeletal: No joint deformities, erythema, or stiffness, ROM full and no nontender Hematology: no cervical, inginal, or axillary adenopathy.  Neurological: A&O x3, Strength is normal and symmetric bilaterally, cranial nerve II (except left eye)-XII are grossly intact, no focal motor deficit, sensory intact to light touch bilaterally.  Skin: Warm, dry and intact. No rash, cyanosis, or clubbing.  Psychiatric: Normal mood and affect. speech and behavior is normal. Judgment and thought content normal. Cognition and memory are normal.   Assessment & Plan:  Case and care discussed with Dr.  Lynnae January Please see problem oriented charting for further details. Patient to return in 3 months for DM  follow up.

## 2012-10-04 NOTE — Assessment & Plan Note (Signed)
Continues to do well without decline in functional status or fall risk. Reasonable to continue anticoagulation at this time.

## 2012-10-04 NOTE — Patient Instructions (Signed)
General Instructions: -Restart Januvia 50mg  daily.  Continue to check your blood sugars daily.  -We are going to have you go to physical therapy to help with your hand function  -We are going to check your cholesterol today.  Please be sure to bring all of your medications with you to every visit.  Should you have any new or worsening symptoms, please be sure to call the clinic at [phone].   Treatment Goals:  Goals (1 Years of Data) as of 10/04/12         As of Today 05/17/12 02/16/12 02/16/12 07/28/11     Blood Pressure    . Blood Pressure < 140/90  130/61 152/74 148/62 156/72 166/84     Result Component    . HEMOGLOBIN A1C < 8.0  9.2 6.9 7.6  8.2    . LDL CALC < 100            Progress Toward Treatment Goals:  Treatment Goal 10/04/2012  Hemoglobin A1C deteriorated  Blood pressure at goal    Self Care Goals & Plans:  Self Care Goal 10/04/2012  Manage my medications take my medicines as prescribed; bring my medications to every visit  Monitor my health keep track of my blood glucose; bring my glucose meter and log to each visit  Eat healthy foods eat foods that are low in salt; eat baked foods instead of fried foods    Home Blood Glucose Monitoring 10/04/2012  Check my blood sugar once a day  When to check my blood sugar before breakfast     Care Management & Community Referrals: Physical Therapy

## 2012-10-04 NOTE — Assessment & Plan Note (Signed)
Baseline Cr ~1.6, likely related to diabetic and hypertensive nephropathy. Check BMET today.

## 2012-10-04 NOTE — Assessment & Plan Note (Signed)
Compliant with crestor daily. Check lipid profile today.

## 2012-10-04 NOTE — Assessment & Plan Note (Signed)
BP Readings from Last 3 Encounters:  10/04/12 130/61  05/17/12 152/74  02/16/12 148/62    Lab Results  Component Value Date   NA 139 02/16/2012   K 4.8 02/16/2012   CREATININE 1.83* 02/16/2012    Assessment:  Blood pressure control: controlled  Progress toward BP goal:  at goal  Comments: without dizziness/hypotension  Plan:  Medications:  continue current medications - amlodipine 10, metoprolol 12.5, benazepril 40  Educational resources provided: brochure;video  Self management tools provided: home blood pressure logbook

## 2012-10-04 NOTE — Assessment & Plan Note (Signed)
Lab Results  Component Value Date   HGBA1C 9.2 10/04/2012   HGBA1C 6.9 05/17/2012   HGBA1C 7.6 02/16/2012     Assessment:  Diabetes control: poor control (HgbA1C >9%)  Progress toward A1C goal:  deteriorated  Comments: deteriorated because januvia 50mg  was d/c at last visit  Plan:  Medications: Continue glipizide 10, restart januvia 10 (will not increase glipizide because increased risk of hypoglycemia)  Home glucose monitoring:   Frequency: once a day   Timing: before breakfast  Instruction/counseling given: reminded to bring blood glucose meter & log to each visit and reminded to bring medications to each visit  Educational resources provided: brochure  Self management tools provided: copy of home glucose meter download;home glucose logbook  Other plans: She is followed regularly by Dr. Ricki Miller for open angle glaucoma

## 2012-10-23 ENCOUNTER — Ambulatory Visit (INDEPENDENT_AMBULATORY_CARE_PROVIDER_SITE_OTHER): Payer: PRIVATE HEALTH INSURANCE | Admitting: Pharmacist

## 2012-10-23 DIAGNOSIS — Z7901 Long term (current) use of anticoagulants: Secondary | ICD-10-CM

## 2012-10-23 DIAGNOSIS — Z86718 Personal history of other venous thrombosis and embolism: Secondary | ICD-10-CM

## 2012-10-23 LAB — POCT INR: INR: 1.2

## 2012-10-23 NOTE — Progress Notes (Signed)
Anti-Coagulation Progress Note  Chelsea Mcdaniel is a 79 y.o. female who is currently on an anti-coagulation regimen.    RECENT RESULTS: Recent results are below, the most recent result is correlated with a dose of 30 mg. per week: Lab Results  Component Value Date   INR 1.20 10/23/2012   INR 3.40 10/02/2012   INR 4.50 09/11/2012    ANTI-COAG DOSE: Anticoagulation Dose Instructions as of 10/23/2012     Sun Mon Tue Wed Thu Fri Sat   New Dose 3.75 mg 7.5 mg 3.75 mg 7.5 mg 3.75 mg 7.5 mg 3.75 mg       ANTICOAG SUMMARY: Anticoagulation Episode Summary   Current INR goal 2.0-3.0  Next INR check 11/13/2012  INR from last check 1.20! (10/23/2012)  Weekly max dose   Target end date Indefinite  [facility] check location [facility]  Preferred lab   Send INR reminders to [facility]   Indications  Long-term (current) use of anticoagulants [V58.61] Personal history of DVT (deep vein thrombosis) [V12.51]        Comments       Anticoagulation Care Providers   Provider Role Specialty Phone number   Acquanetta Chain, DO  Internal Medicine [phone]      ANTICOAG TODAY: Anticoagulation Summary as of 10/23/2012   INR goal 2.0-3.0  Selected INR 1.20! (10/23/2012)  Next INR check 11/13/2012  Target end date Indefinite   Indications  Long-term (current) use of anticoagulants [V58.61] Personal history of DVT (deep vein thrombosis) [V12.51]      Anticoagulation Episode Summary   [facility] check location [facility]   Preferred lab    Send INR reminders to [facility]   Comments     Anticoagulation Care Providers   Provider Role Specialty Phone number   Acquanetta Chain, DO  Internal Medicine [phone]      PATIENT INSTRUCTIONS: Patient Instructions  Patient instructed to take medications as defined in the Anti-coagulation Track section of this encounter.  Patient instructed to take today's dose.  Patient verbalized understanding of these instructions.        FOLLOW-UP Return in 3 weeks (on 11/13/2012) for Follow up INR at [place], III Pharm.D., CACP

## 2012-10-23 NOTE — Patient Instructions (Signed)
Patient instructed to take medications as defined in the Anti-coagulation Track section of this encounter.  Patient instructed to take today's dose.  Patient verbalized understanding of these instructions.    

## 2012-10-26 NOTE — Progress Notes (Signed)
I have reviewed the note and plan as documented by Dr. Elie Confer.

## 2012-11-02 ENCOUNTER — Ambulatory Visit: Payer: PRIVATE HEALTH INSURANCE | Attending: Internal Medicine | Admitting: Occupational Therapy

## 2012-11-02 ENCOUNTER — Ambulatory Visit: Payer: PRIVATE HEALTH INSURANCE | Admitting: Occupational Therapy

## 2012-11-02 DIAGNOSIS — IMO0001 Reserved for inherently not codable concepts without codable children: Secondary | ICD-10-CM | POA: Insufficient documentation

## 2012-11-02 DIAGNOSIS — R279 Unspecified lack of coordination: Secondary | ICD-10-CM | POA: Insufficient documentation

## 2012-11-02 DIAGNOSIS — M6281 Muscle weakness (generalized): Secondary | ICD-10-CM | POA: Insufficient documentation

## 2012-11-02 NOTE — Addendum Note (Signed)
Addended by: Othella Boyer on: 11/02/2012 07:31 PM   Modules accepted: Orders

## 2012-11-13 ENCOUNTER — Ambulatory Visit (INDEPENDENT_AMBULATORY_CARE_PROVIDER_SITE_OTHER): Payer: PRIVATE HEALTH INSURANCE | Admitting: Pharmacist

## 2012-11-13 DIAGNOSIS — Z86718 Personal history of other venous thrombosis and embolism: Secondary | ICD-10-CM

## 2012-11-13 DIAGNOSIS — Z7901 Long term (current) use of anticoagulants: Secondary | ICD-10-CM

## 2012-11-13 MED ORDER — WARFARIN SODIUM 7.5 MG PO TABS: ORAL_TABLET | ORAL | Status: DC

## 2012-11-13 NOTE — Progress Notes (Signed)
Anti-Coagulation Progress Note  Chelsea Mcdaniel is a 79 y.o. female who is currently on an anti-coagulation regimen.    RECENT RESULTS: Recent results are below, the most recent result is correlated with a dose of 37.5 mg. per week: Lab Results  Component Value Date   INR 2.10 11/13/2012   INR 1.20 10/23/2012   INR 3.40 10/02/2012    ANTI-COAG DOSE: Anticoagulation Dose Instructions as of 11/13/2012     Sun Mon Tue Wed Thu Fri Sat   New Dose 3.75 mg 7.5 mg 7.5 mg 7.5 mg 3.75 mg 7.5 mg 3.75 mg       ANTICOAG SUMMARY: Anticoagulation Episode Summary   Current INR goal 2.0-3.0  Next INR check 12/04/2012  INR from last check 2.10 (11/13/2012)  Weekly max dose   Target end date Indefinite  [facility]  Preferred lab   Send INR reminders to [facility]   Indications  Long-term (current) use of anticoagulants [V58.61] Personal history of DVT (deep vein thrombosis) [V12.51]        Comments       Anticoagulation Care Providers   Provider Role Specialty Phone number   Acquanetta Chain, DO  Internal Medicine [phone]      ANTICOAG TODAY: Anticoagulation Summary as of 11/13/2012   INR goal 2.0-3.0  Selected INR 2.10 (11/13/2012)  Next INR check 12/04/2012  Target end date Indefinite   Indications  Long-term (current) use of anticoagulants [V58.61] Personal history of DVT (deep vein thrombosis) [V12.51]      Anticoagulation Episode Summary   [facility] check location [facility]   Preferred lab    Send INR reminders to [facility]   Comments     Anticoagulation Care Providers   Provider Role Specialty Phone number   Acquanetta Chain, DO  Internal Medicine [phone]      PATIENT INSTRUCTIONS: Patient Instructions  Patient instructed to take medications as defined in the Anti-coagulation Track section of this encounter.  Patient instructed to take today's dose.  Patient verbalized understanding of these instructions.        FOLLOW-UP Return in 3 weeks (on 12/04/2012) for Follow up INR at 3:30PM.  [name], III Pharm.D., CACP

## 2012-11-13 NOTE — Patient Instructions (Signed)
Patient instructed to take medications as defined in the Anti-coagulation Track section of this encounter.  Patient instructed to take today's dose.  Patient verbalized understanding of these instructions.    

## 2012-11-14 ENCOUNTER — Ambulatory Visit: Payer: PRIVATE HEALTH INSURANCE | Admitting: *Deleted

## 2012-11-16 ENCOUNTER — Ambulatory Visit: Payer: PRIVATE HEALTH INSURANCE | Admitting: *Deleted

## 2012-11-20 ENCOUNTER — Ambulatory Visit: Payer: PRIVATE HEALTH INSURANCE | Admitting: *Deleted

## 2012-11-22 ENCOUNTER — Ambulatory Visit: Payer: PRIVATE HEALTH INSURANCE | Admitting: *Deleted

## 2012-11-28 ENCOUNTER — Ambulatory Visit: Payer: PRIVATE HEALTH INSURANCE | Attending: Internal Medicine | Admitting: Occupational Therapy

## 2012-11-28 DIAGNOSIS — R279 Unspecified lack of coordination: Secondary | ICD-10-CM | POA: Insufficient documentation

## 2012-11-28 DIAGNOSIS — IMO0001 Reserved for inherently not codable concepts without codable children: Secondary | ICD-10-CM | POA: Insufficient documentation

## 2012-11-28 DIAGNOSIS — M6281 Muscle weakness (generalized): Secondary | ICD-10-CM | POA: Insufficient documentation

## 2012-11-30 ENCOUNTER — Ambulatory Visit: Payer: PRIVATE HEALTH INSURANCE | Admitting: Occupational Therapy

## 2012-12-01 ENCOUNTER — Other Ambulatory Visit: Payer: Self-pay | Admitting: *Deleted

## 2012-12-03 MED ORDER — BENAZEPRIL HCL 40 MG PO TABS: 40.0000 mg | ORAL_TABLET | Freq: Every day | ORAL | Status: DC

## 2012-12-04 ENCOUNTER — Ambulatory Visit (INDEPENDENT_AMBULATORY_CARE_PROVIDER_SITE_OTHER): Payer: PRIVATE HEALTH INSURANCE | Admitting: Pharmacist

## 2012-12-04 DIAGNOSIS — Z7901 Long term (current) use of anticoagulants: Secondary | ICD-10-CM

## 2012-12-04 DIAGNOSIS — Z86718 Personal history of other venous thrombosis and embolism: Secondary | ICD-10-CM

## 2012-12-04 LAB — POCT INR: INR: 2.7

## 2012-12-04 NOTE — Patient Instructions (Signed)
Patient instructed to take medications as defined in the Anti-coagulation Track section of this encounter.  Patient instructed to take today's dose.  Patient verbalized understanding of these instructions.    

## 2012-12-04 NOTE — Progress Notes (Signed)
Anti-Coagulation Progress Note  Chelsea Mcdaniel is a 79 y.o. female who is currently on an anti-coagulation regimen.    RECENT RESULTS: Recent results are below, the most recent result is correlated with a dose of 41.25 mg. per week: Lab Results  Component Value Date   INR 2.70 12/04/2012   INR 2.10 11/13/2012   INR 1.20 10/23/2012    ANTI-COAG DOSE: Anticoagulation Dose Instructions as of 12/04/2012     Sun Mon Tue Wed Thu Fri Sat   New Dose 3.75 mg 7.5 mg 7.5 mg 7.5 mg 3.75 mg 7.5 mg 3.75 mg       ANTICOAG SUMMARY: Anticoagulation Episode Summary   Current INR goal 2.0-3.0  Next INR check 01/01/2013  INR from last check 2.70 (12/04/2012)  Weekly max dose   Target end date Indefinite  [facility] check location [facility]  Preferred lab   Send INR reminders to [facility]   Indications  Long-term (current) use of anticoagulants [V58.61] Personal history of DVT (deep vein thrombosis) [V12.51]        Comments       Anticoagulation Care Providers   Provider Role Specialty Phone number   Acquanetta Chain, DO  Internal Medicine [phone]      ANTICOAG TODAY: Anticoagulation Summary as of 12/04/2012   INR goal 2.0-3.0  Selected INR 2.70 (12/04/2012)  Next INR check 01/01/2013  Target end date Indefinite   Indications  Long-term (current) use of anticoagulants [V58.61] Personal history of DVT (deep vein thrombosis) [V12.51]      Anticoagulation Episode Summary   [facility] check location [facility]   Preferred lab    Send INR reminders to [facility]   Comments     Anticoagulation Care Providers   Provider Role Specialty Phone number   Acquanetta Chain, DO  Internal Medicine [phone]      PATIENT INSTRUCTIONS: Patient Instructions  Patient instructed to take medications as defined in the Anti-coagulation Track section of this encounter.  Patient instructed to take today's dose.  Patient verbalized understanding of these instructions.        FOLLOW-UP Return in 4 weeks (on 01/01/2013) for Follow up INR at [place], III Pharm.D., CACP

## 2012-12-05 ENCOUNTER — Ambulatory Visit: Payer: PRIVATE HEALTH INSURANCE | Admitting: Occupational Therapy

## 2012-12-07 ENCOUNTER — Ambulatory Visit: Payer: PRIVATE HEALTH INSURANCE | Admitting: Occupational Therapy

## 2013-01-01 ENCOUNTER — Ambulatory Visit (INDEPENDENT_AMBULATORY_CARE_PROVIDER_SITE_OTHER): Payer: PRIVATE HEALTH INSURANCE | Admitting: Pharmacist

## 2013-01-01 DIAGNOSIS — Z7901 Long term (current) use of anticoagulants: Secondary | ICD-10-CM

## 2013-01-01 DIAGNOSIS — Z86718 Personal history of other venous thrombosis and embolism: Secondary | ICD-10-CM

## 2013-01-01 LAB — POCT INR: INR: 3

## 2013-01-01 NOTE — Progress Notes (Signed)
Anti-Coagulation Progress Note  Chelsea Mcdaniel is a 79 y.o. female who is currently on an anti-coagulation regimen.    RECENT RESULTS: Recent results are below, the most recent result is correlated with a dose of 41.25 mg. per week: Lab Results  Component Value Date   INR 3.00 01/01/2013   INR 2.70 12/04/2012   INR 2.10 11/13/2012    ANTI-COAG DOSE: Anticoagulation Dose Instructions as of 01/01/2013     Sun Mon Tue Wed Thu Fri Sat   New Dose 3.75 mg 7.5 mg 3.75 mg 7.5 mg 3.75 mg 7.5 mg 3.75 mg       ANTICOAG SUMMARY: Anticoagulation Episode Summary   Current INR goal 2.0-3.0  Next INR check 01/22/2013  INR from last check 3.00 (01/01/2013)  Weekly max dose   Target end date Indefinite  [facility] check location [facility]  Preferred lab   Send INR reminders to [facility]   Indications  Long-term (current) use of anticoagulants [V58.61] Personal history of DVT (deep vein thrombosis) [V12.51]        Comments       Anticoagulation Care Providers   Provider Role Specialty Phone number   Acquanetta Chain, DO  Internal Medicine [phone]      ANTICOAG TODAY: Anticoagulation Summary as of 01/01/2013   INR goal 2.0-3.0  Selected INR 3.00 (01/01/2013)  Next INR check 01/22/2013  Target end date Indefinite   Indications  Long-term (current) use of anticoagulants [V58.61] Personal history of DVT (deep vein thrombosis) [V12.51]      Anticoagulation Episode Summary   [facility] check location [facility]   Preferred lab    Send INR reminders to [facility]   Comments     Anticoagulation Care Providers   Provider Role Specialty Phone number   Acquanetta Chain, DO  Internal Medicine [phone]      PATIENT INSTRUCTIONS: Patient Instructions  Patient instructed to take medications as defined in the Anti-coagulation Track section of this encounter.  Patient instructed to take today's dose.  Patient verbalized understanding of these instructions.        FOLLOW-UP Return in 3 weeks (on 01/22/2013) for Follow up INR at 3:45PM.  [name], III Pharm.D., CACP

## 2013-01-01 NOTE — Patient Instructions (Signed)
Patient instructed to take medications as defined in the Anti-coagulation Track section of this encounter.  Patient instructed to take today's dose.  Patient verbalized understanding of these instructions.    

## 2013-01-22 ENCOUNTER — Ambulatory Visit (INDEPENDENT_AMBULATORY_CARE_PROVIDER_SITE_OTHER): Payer: PRIVATE HEALTH INSURANCE | Admitting: Pharmacist

## 2013-01-22 DIAGNOSIS — Z7901 Long term (current) use of anticoagulants: Secondary | ICD-10-CM

## 2013-01-22 DIAGNOSIS — Z86718 Personal history of other venous thrombosis and embolism: Secondary | ICD-10-CM

## 2013-01-22 MED ORDER — WARFARIN SODIUM 7.5 MG PO TABS: ORAL_TABLET | ORAL | Status: DC

## 2013-01-22 NOTE — Patient Instructions (Signed)
Patient instructed to take medications as defined in the Anti-coagulation Track section of this encounter.  Patient instructed to take today's dose.  Patient verbalized understanding of these instructions.    

## 2013-01-22 NOTE — Progress Notes (Signed)
Anti-Coagulation Progress Note  Chelsea Mcdaniel is a 79 y.o. female who is currently on an anti-coagulation regimen.    RECENT RESULTS: Recent results are below, the most recent result is correlated with a dose of 37.5 mg. per week: Lab Results  Component Value Date   INR 3.30 01/22/2013   INR 3.00 01/01/2013   INR 2.70 12/04/2012    ANTI-COAG DOSE: Anticoagulation Dose Instructions as of 01/22/2013     Sun Mon Tue Wed Thu Fri Sat   New Dose 3.75 mg 7.5 mg 3.75 mg 3.75 mg 7.5 mg 3.75 mg 3.75 mg       ANTICOAG SUMMARY: Anticoagulation Episode Summary   Current INR goal 2.0-3.0  Next INR check 02/12/2013  INR from last check 3.30! (01/22/2013)  Weekly max dose   Target end date Indefinite  [facility] check location [facility]  Preferred lab   Send INR reminders to [facility]   Indications  Long-term (current) use of anticoagulants [V58.61] Personal history of DVT (deep vein thrombosis) [V12.51]        Comments       Anticoagulation Care Providers   Provider Role Specialty Phone number   Acquanetta Chain, DO  Internal Medicine [phone]      ANTICOAG TODAY: Anticoagulation Summary as of 01/22/2013   INR goal 2.0-3.0  Selected INR 3.30! (01/22/2013)  Next INR check 02/12/2013  Target end date Indefinite   Indications  Long-term (current) use of anticoagulants [V58.61] Personal history of DVT (deep vein thrombosis) [V12.51]      Anticoagulation Episode Summary   [facility] check location [facility]   Preferred lab    Send INR reminders to [facility]   Comments     Anticoagulation Care Providers   Provider Role Specialty Phone number   Acquanetta Chain, DO  Internal Medicine [phone]      PATIENT INSTRUCTIONS: Patient Instructions  Patient instructed to take medications as defined in the Anti-coagulation Track section of this encounter.  Patient instructed to take today's dose.  Patient verbalized understanding of these instructions.        FOLLOW-UP Return in 3 weeks (on 02/12/2013) for Follow up INR at 3:15PM.  [name], III Pharm.D., CACP

## 2013-01-24 ENCOUNTER — Other Ambulatory Visit: Payer: Self-pay | Admitting: *Deleted

## 2013-01-24 DIAGNOSIS — I10 Essential (primary) hypertension: Secondary | ICD-10-CM

## 2013-01-24 MED ORDER — FUROSEMIDE 40 MG PO TABS: 40.0000 mg | ORAL_TABLET | Freq: Every day | ORAL | Status: DC

## 2013-02-01 ENCOUNTER — Other Ambulatory Visit: Payer: Self-pay

## 2013-02-12 ENCOUNTER — Ambulatory Visit (INDEPENDENT_AMBULATORY_CARE_PROVIDER_SITE_OTHER): Payer: PRIVATE HEALTH INSURANCE | Admitting: Pharmacist

## 2013-02-12 DIAGNOSIS — Z7901 Long term (current) use of anticoagulants: Secondary | ICD-10-CM

## 2013-02-12 DIAGNOSIS — Z86718 Personal history of other venous thrombosis and embolism: Secondary | ICD-10-CM

## 2013-02-12 NOTE — Progress Notes (Signed)
Anti-Coagulation Progress Note  Chelsea Mcdaniel is a 79 y.o. female who is currently on an anti-coagulation regimen.    RECENT RESULTS: Recent results are below, the most recent result is correlated with a dose of 33.75 mg. per week: Lab Results  Component Value Date   INR 1.50 02/12/2013   INR 3.30 01/22/2013   INR 3.00 01/01/2013    ANTI-COAG DOSE: Anticoagulation Dose Instructions as of 02/12/2013     [name] Tue Wed Thu Fri Sat   New Dose 3.75 mg 7.5 mg 7.5 mg 3.75 mg 7.5 mg 3.75 mg 3.75 mg       ANTICOAG SUMMARY: Anticoagulation Episode Summary   Current INR goal 2.0-3.0  Next INR check 02/26/2013  INR from last check 1.50! (02/12/2013)  Weekly max dose   Target end date Indefinite  [facility] check location [facility]  Preferred lab   Send INR reminders to [facility]   Indications  Long-term (current) use of anticoagulants [V58.61] Personal history of DVT (deep vein thrombosis) [V12.51]        Comments       Anticoagulation Care Providers   Provider Role Specialty Phone number   Acquanetta Chain, DO  Internal Medicine [phone]      ANTICOAG TODAY: Anticoagulation Summary as of 02/12/2013   INR goal 2.0-3.0  Selected INR 1.50! (02/12/2013)  Next INR check 02/26/2013  Target end date Indefinite   Indications  Long-term (current) use of anticoagulants [V58.61] Personal history of DVT (deep vein thrombosis) [V12.51]      Anticoagulation Episode Summary   [facility] check location [facility]   Preferred lab    Send INR reminders to [facility]   Comments     Anticoagulation Care Providers   Provider Role Specialty Phone number   Acquanetta Chain, DO  Internal Medicine [phone]      PATIENT INSTRUCTIONS: Patient Instructions  Patient instructed to take medications as defined in the Anti-coagulation Track section of this encounter.  Patient instructed to take today's dose.  Patient verbalized understanding of these instructions.        FOLLOW-UP Return in 2 weeks (on 02/26/2013) for Follow up INR at 3:15PM.  [name], III Pharm.D., CACP

## 2013-02-12 NOTE — Patient Instructions (Signed)
Patient instructed to take medications as defined in the Anti-coagulation Track section of this encounter.  Patient instructed to take today's dose.  Patient verbalized understanding of these instructions.    

## 2013-02-26 ENCOUNTER — Ambulatory Visit (INDEPENDENT_AMBULATORY_CARE_PROVIDER_SITE_OTHER): Payer: PRIVATE HEALTH INSURANCE | Admitting: Pharmacist

## 2013-02-26 DIAGNOSIS — Z86718 Personal history of other venous thrombosis and embolism: Secondary | ICD-10-CM

## 2013-02-26 DIAGNOSIS — Z7901 Long term (current) use of anticoagulants: Secondary | ICD-10-CM

## 2013-02-26 LAB — POCT INR: INR: 2

## 2013-02-26 NOTE — Progress Notes (Signed)
Anti-Coagulation Progress Note  Chelsea Mcdaniel is a 79 y.o. female who is currently on an anti-coagulation regimen.    RECENT RESULTS: Recent results are below, the most recent result is correlated with a dose of 37.5 mg. per week: Lab Results  Component Value Date   INR 2.0 02/26/2013   INR 1.50 02/12/2013   INR 3.30 01/22/2013    ANTI-COAG DOSE: Anticoagulation Dose Instructions as of 02/26/2013     Sun Mon Tue Wed Thu Fri Sat   New Dose 7.5 mg 3.75 mg 7.5 mg 3.75 mg 7.5 mg 3.75 mg 7.5 mg       ANTICOAG SUMMARY: Anticoagulation Episode Summary   Current INR goal 2.0-3.0  Next INR check 03/19/2013  INR from last check 2.0 (02/26/2013)  Weekly max dose   Target end date Indefinite  [facility] check location [facility]  Preferred lab   Send INR reminders to [facility]   Indications  Long-term (current) use of anticoagulants [V58.61] Personal history of DVT (deep vein thrombosis) [V12.51]        Comments       Anticoagulation Care Providers   Provider Role Specialty Phone number   Acquanetta Chain, DO  Internal Medicine [phone]      ANTICOAG TODAY: Anticoagulation Summary as of 02/26/2013   INR goal 2.0-3.0  Selected INR 2.0 (02/26/2013)  Next INR check 03/19/2013  Target end date Indefinite   Indications  Long-term (current) use of anticoagulants [V58.61] Personal history of DVT (deep vein thrombosis) [V12.51]      Anticoagulation Episode Summary   [facility] check location [facility]   Preferred lab    Send INR reminders to [facility]   Comments     Anticoagulation Care Providers   Provider Role Specialty Phone number   Acquanetta Chain, DO  Internal Medicine [phone]      PATIENT INSTRUCTIONS: Patient Instructions  Patient instructed to take medications as defined in the Anti-coagulation Track section of this encounter.  Patient instructed to take today's dose.  Patient verbalized understanding of these instructions.       FOLLOW-UP Return  in 3 weeks (on 03/19/2013) for Follow up INR at 3:30PM.  [name], III Pharm.D., CACP

## 2013-02-26 NOTE — Patient Instructions (Signed)
Patient instructed to take medications as defined in the Anti-coagulation Track section of this encounter.  Patient instructed to take today's dose.  Patient verbalized understanding of these instructions.    

## 2013-03-07 ENCOUNTER — Encounter: Payer: PRIVATE HEALTH INSURANCE | Admitting: Internal Medicine

## 2013-03-09 ENCOUNTER — Other Ambulatory Visit: Payer: Self-pay | Admitting: Internal Medicine

## 2013-03-14 ENCOUNTER — Other Ambulatory Visit: Payer: Self-pay | Admitting: Internal Medicine

## 2013-03-16 NOTE — Progress Notes (Signed)
This encounter was created in error - please disregard.

## 2013-03-19 ENCOUNTER — Other Ambulatory Visit: Payer: Self-pay | Admitting: Internal Medicine

## 2013-03-19 ENCOUNTER — Ambulatory Visit (INDEPENDENT_AMBULATORY_CARE_PROVIDER_SITE_OTHER): Payer: PRIVATE HEALTH INSURANCE | Admitting: Pharmacist

## 2013-03-19 DIAGNOSIS — Z86718 Personal history of other venous thrombosis and embolism: Secondary | ICD-10-CM

## 2013-03-19 DIAGNOSIS — Z7901 Long term (current) use of anticoagulants: Secondary | ICD-10-CM

## 2013-03-19 LAB — POCT INR: INR: 3.1

## 2013-03-19 MED ORDER — WARFARIN SODIUM 7.5 MG PO TABS: ORAL_TABLET | ORAL | Status: DC

## 2013-03-19 NOTE — Progress Notes (Signed)
Anti-Coagulation Progress Note  Chelsea Mcdaniel is a 79 y.o. female who is currently on an anti-coagulation regimen.    RECENT RESULTS: Recent results are below, the most recent result is correlated with a dose of 41.25 mg. per week: Lab Results  Component Value Date   INR 3.10 03/19/2013   INR 2.0 02/26/2013   INR 1.50 02/12/2013    ANTI-COAG DOSE: Anticoagulation Dose Instructions as of 03/19/2013     Sun Mon Tue Wed Thu Fri Sat   New Dose 3.75 mg 3.75 mg 7.5 mg 3.75 mg 7.5 mg 3.75 mg 7.5 mg       ANTICOAG SUMMARY: Anticoagulation Episode Summary   Current INR goal 2.0-3.0  Next INR check 04/09/2013  INR from last check 3.10! (03/19/2013)  Weekly max dose   Target end date Indefinite  [facility] check location [facility]  Preferred lab   Send INR reminders to [facility]   Indications  Long-term (current) use of anticoagulants [V58.61] Personal history of DVT (deep vein thrombosis) [V12.51]        Comments       Anticoagulation Care Providers   Provider Role Specialty Phone number   Acquanetta Chain, DO  Internal Medicine [phone]      ANTICOAG TODAY: Anticoagulation Summary as of 03/19/2013   INR goal 2.0-3.0  Selected INR 3.10! (03/19/2013)  Next INR check 04/09/2013  Target end date Indefinite   Indications  Long-term (current) use of anticoagulants [V58.61] Personal history of DVT (deep vein thrombosis) [V12.51]      Anticoagulation Episode Summary   [facility] check location [facility]   Preferred lab    Send INR reminders to [facility]   Comments     Anticoagulation Care Providers   Provider Role Specialty Phone number   Acquanetta Chain, DO  Internal Medicine [phone]      PATIENT INSTRUCTIONS: Patient Instructions  Patient instructed to take medications as defined in the Anti-coagulation Track section of this encounter.  Patient instructed to take today's dose.  Patient verbalized understanding of these instructions.        FOLLOW-UP Return in 3 weeks (on 04/09/2013) for Follow up INR at [place], III Pharm.D., CACP

## 2013-03-19 NOTE — Patient Instructions (Signed)
Patient instructed to take medications as defined in the Anti-coagulation Track section of this encounter.  Patient instructed to take today's dose.  Patient verbalized understanding of these instructions.    

## 2013-04-09 ENCOUNTER — Ambulatory Visit (INDEPENDENT_AMBULATORY_CARE_PROVIDER_SITE_OTHER): Payer: PRIVATE HEALTH INSURANCE | Admitting: Pharmacist

## 2013-04-09 DIAGNOSIS — Z7901 Long term (current) use of anticoagulants: Secondary | ICD-10-CM

## 2013-04-09 DIAGNOSIS — Z86718 Personal history of other venous thrombosis and embolism: Secondary | ICD-10-CM

## 2013-04-09 NOTE — Progress Notes (Signed)
Anti-Coagulation Progress Note  Vergil Gosch is a 79 y.o. female who is currently on an anti-coagulation regimen.    RECENT RESULTS: Recent results are below, the most recent result is correlated with a dose of 37.5 mg. per week: Lab Results  Component Value Date   INR 1.50 04/09/2013   INR 3.10 03/19/2013   INR 2.0 02/26/2013    ANTI-COAG DOSE: Anticoagulation Dose Instructions as of 04/09/2013     [name] Tue Wed Thu Fri Sat   New Dose 3.75 mg 7.5 mg 7.5 mg 7.5 mg 7.5 mg 3.75 mg 7.5 mg       ANTICOAG SUMMARY: Anticoagulation Episode Summary   Current INR goal 2.0-3.0  Next INR check 04/23/2013  INR from last check 1.50! (04/09/2013)  Weekly max dose   Target end date Indefinite  [facility] check location [facility]  Preferred lab   Send INR reminders to [facility]   Indications  Long-term (current) use of anticoagulants [V58.61] Personal history of DVT (deep vein thrombosis) [V12.51]        Comments       Anticoagulation Care Providers   Provider Role Specialty Phone number   Acquanetta Chain, DO  Internal Medicine [phone]      ANTICOAG TODAY: Anticoagulation Summary as of 04/09/2013   INR goal 2.0-3.0  Selected INR 1.50! (04/09/2013)  Next INR check 04/23/2013  Target end date Indefinite   Indications  Long-term (current) use of anticoagulants [V58.61] Personal history of DVT (deep vein thrombosis) [V12.51]      Anticoagulation Episode Summary   [facility] check location [facility]   Preferred lab    Send INR reminders to [facility]   Comments     Anticoagulation Care Providers   Provider Role Specialty Phone number   Acquanetta Chain, DO  Internal Medicine [phone]      PATIENT INSTRUCTIONS: Patient Instructions  Patient instructed to take medications as defined in the Anti-coagulation Track section of this encounter.  Patient instructed to take today's dose.  Patient verbalized understanding of these instructions.        FOLLOW-UP Return in 2 weeks (on 04/23/2013) for Follow up INR at [place], III Pharm.D., CACP

## 2013-04-09 NOTE — Patient Instructions (Signed)
Patient instructed to take medications as defined in the Anti-coagulation Track section of this encounter.  Patient instructed to take today's dose.  Patient verbalized understanding of these instructions.    

## 2013-04-11 ENCOUNTER — Ambulatory Visit (INDEPENDENT_AMBULATORY_CARE_PROVIDER_SITE_OTHER): Payer: PRIVATE HEALTH INSURANCE | Admitting: Internal Medicine

## 2013-04-11 ENCOUNTER — Encounter: Payer: Self-pay | Admitting: Internal Medicine

## 2013-04-11 VITALS — BP 174/72 | HR 70 | Temp 97.1°F | Ht 62.5 in | Wt 167.7 lb

## 2013-04-11 DIAGNOSIS — E119 Type 2 diabetes mellitus without complications: Secondary | ICD-10-CM

## 2013-04-11 DIAGNOSIS — I10 Essential (primary) hypertension: Secondary | ICD-10-CM

## 2013-04-11 LAB — GLUCOSE, CAPILLARY: Glucose-Capillary: 163 mg/dL — ABNORMAL HIGH (ref 70–99)

## 2013-04-11 MED ORDER — FUROSEMIDE 40 MG PO TABS: 40.0000 mg | ORAL_TABLET | Freq: Every day | ORAL | Status: DC

## 2013-04-11 MED ORDER — FUROSEMIDE 20 MG PO TABS: ORAL_TABLET | ORAL | Status: DC

## 2013-04-11 MED ORDER — FUROSEMIDE 20 MG PO TABS: 20.0000 mg | ORAL_TABLET | Freq: Every day | ORAL | Status: DC

## 2013-04-11 NOTE — Patient Instructions (Addendum)
General Instructions: -Today I am going to check your kidney function -We are adding another dose of lasix to help control your blood pressure - continue taking 40mg  lasix in the morning, and then take 20mg  of lasix before supper - continue to record your blood pressures  Please be sure to bring all of your medications with you to every visit.  Should you have any new or worsening symptoms, please be sure to call the clinic at [phone].    Treatment Goals:  Goals (1 Years of Data) as of 04/11/13         As of Today 10/04/12 05/17/12 02/16/12 12/10/10     Blood Pressure    . Blood Pressure < 140/90  174/72 130/61 152/74 148/62      Result Component    . HEMOGLOBIN A1C < 8.0  6.9 9.2 6.9 7.6     . LDL CALC < 100   69   74      Progress Toward Treatment Goals:  Treatment Goal 04/11/2013  Hemoglobin A1C -  Blood pressure unchanged    Self Care Goals & Plans:  Self Care Goal 04/11/2013  Manage my medications take my medicines as prescribed; bring my medications to every visit; refill my medications on time  Monitor my health -  Eat healthy foods eat foods that are low in salt; eat baked foods instead of fried foods    Home Blood Glucose Monitoring 10/04/2012  Check my blood sugar once a day  When to check my blood sugar before breakfast     Care Management & Community Referrals:

## 2013-04-11 NOTE — Assessment & Plan Note (Addendum)
BP Readings from Last 3 Encounters:  04/11/13 174/72  10/04/12 130/61  05/17/12 152/74    Lab Results  Component Value Date   NA 136 10/04/2012   K 4.6 10/04/2012   CREATININE 1.43* 10/04/2012    Assessment: Blood pressure control: moderately elevated Progress toward BP goal:  unchanged (based on log brought with patient with SBPs consistently around 160-180)  Plan: Medications:  given h/o stroke & high functional status, I believe we should be a bit more aggressive with her BP mgmt; she is already on benazepril 40, amlodipine 10, metoprolol 12.5, and lasix 40; not likely to get much benefit by increasing benazepril, and do not want increase BB given DM (risk not to recognize hypoglycemia) & increased risk of orthostasis, plus it wouldn't be very useful for BP; we will have her take lasix 40mg  qAM and 20mg  qSupper (to avoid having to urinate through the night) and recheck in 1 month (also with b/l LE edema) Educational resources provided: brochure Self management tools provided: home blood pressure logbook Other plans: BMET today, and repeat at follow up

## 2013-04-11 NOTE — Progress Notes (Signed)
Subjective:   Patient ID: Chelsea Mcdaniel female   DOB: May 25, 1928 79 y.o.   MRN: [id number]  HPI: [name] is a 79 y.o. woman with history of DM, HTN, HLD, CVA and CKD that presents for routine follow up.   She has no complaints. No falls/dizziness/symptomatic hypoglycemia. Brings all of her medications with her, which her aid helps with. No new pains, but has chronic difficulties with holding items with b/l hands (has undergone OT which has helped some).  Lives alone, but daughter near by, and brings her to her appts. Able to feed self, aid helps with cooking/bathing. Sleeping well. No problems with mood.   On long term coumadin, follows with Dr. Elie Confer - knows about recent dose changes given low INR. No over s/s of bleeding.  Review of Systems: Constitutional: Denies fever, chills, diaphoresis, appetite change and fatigue.  HEENT: Denies photophobia, eye pain, redness, hearing loss, ear pain, congestion, sore throat, rhinorrhea, sneezing, mouth sores, trouble swallowing, neck pain, neck stiffness and tinnitus.  Respiratory: Denies SOB, DOE, cough, chest tightness, and wheezing.  Cardiovascular: Denies chest pain, palpitations and leg swelling.  Gastrointestinal: Denies nausea, vomiting, abdominal pain, diarrhea, constipation,blood in stool and abdominal distention.  Genitourinary: Denies dysuria, urgency, frequency, hematuria, flank pain and difficulty urinating.  Musculoskeletal: Denies myalgias, back pain, joint swelling, arthralgias and gait problem.  Skin: Denies pallor, rash and wound.  Neurological: Denies dizziness, seizures, syncope, weakness, lightheadedness, numbness and headaches.   Past Medical History  Diagnosis Date  . Diabetes mellitus   . Hyperlipidemia   . Hypertension   . CVA (cerebrovascular accident)  October 2007     CT of the head atrophy with multiple remote insults noted but no definite acute findings  . Blindness of left eye      likely related to  stroke, left cataract removed from that eye with complications  . Anemia      normocytic anemia with baseline hemoglobin 10-11  . Chronic kidney disease 2006     left renal artery stenosis with probable hemodynamic significance, kidneys are normal in morphology without focal lesions or hydronephrosis this is based but cannot A. of the abdomen with and without contrast done on the 31st 2006   Current Outpatient Prescriptions  Medication Sig Dispense Refill  . amLODipine (NORVASC) 10 MG tablet Take 1 tablet (10 mg total) by mouth daily. For blood pressure  90 tablet  1  . benazepril (LOTENSIN) 40 MG tablet Take 1 tablet (40 mg total) by mouth daily.  90 tablet  1  . Calcium-Vitamin D (CHEWABLE CALCIUM/D) 300 MG CHEW Chew by mouth. Take as directed       . colchicine 0.6 MG tablet Take 1 tablet (0.6 mg total) by mouth 2 (two) times daily as needed. For gout flare up  30 tablet  1  . CRESTOR 5 MG tablet TAKE ONE TABLET BY MOUTH AT BEDTIME  90 tablet  0  . diclofenac sodium (VOLTAREN) 1 % GEL Apply 1 application topically 2 (two) times daily. To knees  1 Tube  5  . furosemide (LASIX) 40 MG tablet Take 1 tablet (40 mg total) by mouth daily.  90 tablet  3  . glipiZIDE (GLUCOTROL XL) 10 MG 24 hr tablet TAKE ONE TABLET BY MOUTH EVERY DAY  90 tablet  0  . indomethacin (INDOCIN) 25 MG capsule Take 1 capsule (25 mg total) by mouth 3 (three) times daily. For 5 days  90 capsule  3  . latanoprost (XALATAN)  0.005 % ophthalmic solution       . metoprolol succinate (TOPROL XL) 12.5 mg TB24 Take 0.5 tablets (12.5 mg total) by mouth daily.  30 tablet  11  . sitaGLIPtin (JANUVIA) 50 MG tablet Take 1 tablet (50 mg total) by mouth daily.  90 tablet  2  . warfarin (COUMADIN) 7.5 MG tablet Take as directed by anticoagulation clinic provider. Take 1/2 tablet daily EXCEPT on Tues/Thurs/Sat--then take 1 tablet.  30 tablet  2   No current facility-administered medications for this visit.   Family History  Problem  Relation Age of Onset  . Heart disease Mother   . Diabetes Mother   . Hyperlipidemia Mother   . Hypertension Mother   . Heart disease Father   . Diabetes Father   . Hyperlipidemia Father   . Hypertension Father    History   Social History  . Marital Status: Widowed    Spouse Name: N/A    Number of Children: N/A  . Years of Education: N/A   Social History Main Topics  . Smoking status: Never Smoker   . Smokeless tobacco: None  . Alcohol Use: No  . Drug Use: No  . Sexual Activity: None   Other Topics Concern  . None   Social History Narrative    Patient is a widow. She has 11 children 5 of who are living. She is retired in 1993 from [employer]. She denies tobacco alcohol or drug use.    Objective:  Physical Exam: Filed Vitals:   04/11/13 1537 04/11/13 1538  BP: 174/72   Pulse: 70   Temp: 97.1 F (36.2 C)   TempSrc: Oral   Height: 5' 2.5" (1.588 m)   Weight: 167 lb 11.2 oz (76.068 kg)   SpO2:  98%   General: pleasant, appears as stated age HEENT: nonreactive left pupil (s/p stroke) Cardiac: RRR, no rubs, murmurs or gallops Pulm: clear to auscultation bilaterally, moving normal volumes of air Abd: soft, nontender, nondistended, BS normoactive Ext: warm and well perfused, b/l 1+ pitting pretibial edema Neuro: alert and oriented X3, cranial nerves II-XII grossly intact  Assessment & Plan:   Case and care discussed with Dr. Ellwood Dense.  Please see problem oriented charting for further details. Patient to return in 1 month for BP follow up.

## 2013-04-11 NOTE — Assessment & Plan Note (Signed)
Lab Results  Component Value Date   HGBA1C 6.9 04/11/2013   HGBA1C 9.2 10/04/2012   HGBA1C 6.9 05/17/2012     Assessment: Diabetes control: good control (HgbA1C at goal) Progress toward A1C goal:   improved  Plan: Medications:  cont januvia & glipizide Home glucose monitoring: Frequency:  1-3 times daily as needed Instruction/counseling given: reminded to bring blood glucose meter & log to each visit and reminded to bring medications to each visit Educational resources provided: brochure Self management tools provided: copy of home glucose meter download

## 2013-04-12 ENCOUNTER — Telehealth: Payer: Self-pay | Admitting: Dietician

## 2013-04-12 ENCOUNTER — Encounter: Payer: Self-pay | Admitting: Internal Medicine

## 2013-04-12 LAB — BASIC METABOLIC PANEL
CO2: 28 mEq/L (ref 19–32)
Chloride: 102 mEq/L (ref 96–112)
Glucose, Bld: 151 mg/dL — ABNORMAL HIGH (ref 70–99)
Potassium: 5.2 mEq/L (ref 3.5–5.3)
Sodium: 139 mEq/L (ref 135–145)

## 2013-04-12 NOTE — Telephone Encounter (Signed)
Dr.Brewington's office said they'd fax [place] her most recent eye exam result as soon as possible.

## 2013-04-20 ENCOUNTER — Other Ambulatory Visit: Payer: Self-pay | Admitting: *Deleted

## 2013-04-20 DIAGNOSIS — I10 Essential (primary) hypertension: Secondary | ICD-10-CM

## 2013-04-20 MED ORDER — AMLODIPINE BESYLATE 10 MG PO TABS: 10.0000 mg | ORAL_TABLET | Freq: Every day | ORAL | Status: DC

## 2013-04-23 ENCOUNTER — Ambulatory Visit (INDEPENDENT_AMBULATORY_CARE_PROVIDER_SITE_OTHER): Payer: PRIVATE HEALTH INSURANCE | Admitting: Pharmacist

## 2013-04-23 DIAGNOSIS — Z86718 Personal history of other venous thrombosis and embolism: Secondary | ICD-10-CM

## 2013-04-23 DIAGNOSIS — Z7901 Long term (current) use of anticoagulants: Secondary | ICD-10-CM

## 2013-04-23 NOTE — Patient Instructions (Signed)
Patient instructed to take medications as defined in the Anti-coagulation Track section of this encounter.  Patient instructed to take today's dose.  Patient verbalized understanding of these instructions.    

## 2013-04-23 NOTE — Progress Notes (Signed)
Anti-Coagulation Progress Note  Chelsea Mcdaniel is a 79 y.o. female who is currently on an anti-coagulation regimen.    RECENT RESULTS: Recent results are below, the most recent result is correlated with a dose of 45 mg. per week: Lab Results  Component Value Date   INR 3.40 04/23/2013   INR 1.50 04/09/2013   INR 3.10 03/19/2013    ANTI-COAG DOSE: Anticoagulation Dose Instructions as of 04/23/2013     [name] Tue Wed Thu Fri Sat   New Dose 7.5 mg 3.75 mg 7.5 mg 3.75 mg 7.5 mg 3.75 mg 7.5 mg       ANTICOAG SUMMARY: Anticoagulation Episode Summary   Current INR goal 2.0-3.0  Next INR check 05/14/2013  INR from last check 3.40! (04/23/2013)  Weekly max dose   Target end date Indefinite  [facility]  Preferred lab   Send INR reminders to [facility]   Indications  Long-term (current) use of anticoagulants [V58.61] Personal history of DVT (deep vein thrombosis) [V12.51]        Comments       Anticoagulation Care Providers   Provider Role Specialty Phone number   Acquanetta Chain, DO  Internal Medicine [phone]      ANTICOAG TODAY: Anticoagulation Summary as of 04/23/2013   INR goal 2.0-3.0  Selected INR 3.40! (04/23/2013)  Next INR check 05/14/2013  Target end date Indefinite   Indications  Long-term (current) use of anticoagulants [V58.61] Personal history of DVT (deep vein thrombosis) [V12.51]      Anticoagulation Episode Summary   [facility] check [facility]   Preferred lab    Send INR reminders to [facility]   Comments     Anticoagulation Care Providers   Provider Role Specialty Phone number   Acquanetta Chain, DO  Internal Medicine [phone]      PATIENT INSTRUCTIONS: Patient Instructions  Patient instructed to take medications as defined in the Anti-coagulation Track section of this encounter.  Patient instructed to take today's dose.  Patient verbalized understanding of these instructions.        FOLLOW-UP Return in 3 weeks (on 05/14/2013) for Follow up INR at 4:15pm.  [name], III Pharm.D., CACP

## 2013-04-25 ENCOUNTER — Other Ambulatory Visit: Payer: Self-pay | Admitting: *Deleted

## 2013-04-30 NOTE — Telephone Encounter (Signed)
closed

## 2013-05-14 ENCOUNTER — Ambulatory Visit (INDEPENDENT_AMBULATORY_CARE_PROVIDER_SITE_OTHER): Payer: Medicare Other | Admitting: Pharmacist

## 2013-05-14 DIAGNOSIS — Z86718 Personal history of other venous thrombosis and embolism: Secondary | ICD-10-CM

## 2013-05-14 DIAGNOSIS — Z7901 Long term (current) use of anticoagulants: Secondary | ICD-10-CM

## 2013-05-14 NOTE — Patient Instructions (Signed)
Anti-Coagulation Progress Note  Chelsea Mcdaniel is a 79 y.o. female who is currently on an anti-coagulation regimen.    RECENT RESULTS: Recent results are below, the most recent result is correlated with a dose of 41.25 mg. per week: Lab Results  Component Value Date   INR 2.50 05/14/2013   INR 3.40 04/23/2013   INR 1.50 04/09/2013    ANTI-COAG DOSE: Anticoagulation Dose Instructions as of 05/14/2013     [name] Tue Wed Thu Fri Sat   New Dose 7.5 mg 3.75 mg 7.5 mg 3.75 mg 7.5 mg 3.75 mg 7.5 mg       ANTICOAG SUMMARY: @ANTICOAGSUMMARY @  ANTICOAG TODAY: @ANTICOAGTODAY @  PATIENT INSTRUCTIONS: @PATINSTR @   FOLLOW-UP No Follow-up on file.  [name], III Pharm.D., CACP

## 2013-05-31 ENCOUNTER — Other Ambulatory Visit: Payer: Self-pay

## 2013-06-04 ENCOUNTER — Ambulatory Visit (INDEPENDENT_AMBULATORY_CARE_PROVIDER_SITE_OTHER): Payer: PRIVATE HEALTH INSURANCE | Admitting: Pharmacist

## 2013-06-04 DIAGNOSIS — Z86718 Personal history of other venous thrombosis and embolism: Secondary | ICD-10-CM

## 2013-06-04 DIAGNOSIS — Z7901 Long term (current) use of anticoagulants: Secondary | ICD-10-CM

## 2013-06-04 LAB — POCT INR: INR: 2.5

## 2013-06-04 NOTE — Progress Notes (Signed)
Anti-Coagulation Progress Note  Chelsea Mcdaniel is a 80 y.o. female who is currently on an anti-coagulation regimen.    RECENT RESULTS: Recent results are below, the most recent result is correlated with a dose of 41.25 mg. per week: Lab Results  Component Value Date   INR 2.50 06/04/2013   INR 2.50 05/14/2013   INR 3.40 04/23/2013    ANTI-COAG DOSE: Anticoagulation Dose Instructions as of 06/04/2013     [name] Tue Wed Thu Fri Sat   New Dose 7.5 mg 3.75 mg 7.5 mg 3.75 mg 7.5 mg 3.75 mg 7.5 mg       ANTICOAG SUMMARY: Anticoagulation Episode Summary   Current INR goal 2.0-3.0  Next INR check 06/25/2013  INR from last check 2.50 (06/04/2013)  Weekly max dose   Target end date Indefinite  [facility]  Preferred lab   Send INR reminders to [facility]   Indications  Long-term (current) use of anticoagulants [V58.61] Personal history of DVT (deep vein thrombosis) [V12.51]        Comments       Anticoagulation Care Providers   Provider Role Specialty Phone number   Acquanetta Chain, DO  Internal Medicine [phone]      ANTICOAG TODAY: Anticoagulation Summary as of 06/04/2013   INR goal 2.0-3.0  Selected INR 2.50 (06/04/2013)  Next INR check 06/25/2013  Target end date Indefinite   Indications  Long-term (current) use of anticoagulants [V58.61] Personal history of DVT (deep vein thrombosis) [V12.51]      Anticoagulation Episode Summary   [facility] check [facility]   Preferred lab    Send INR reminders to [facility]   Comments     Anticoagulation Care Providers   Provider Role Specialty Phone number   Acquanetta Chain, DO  Internal Medicine [phone]      PATIENT INSTRUCTIONS: Patient Instructions  Patient instructed to take medications as defined in the Anti-coagulation Track section of this encounter.  Patient instructed to take today's dose.  Patient verbalized understanding of these instructions.        FOLLOW-UP Return in 3 weeks (on 06/25/2013) for Follow up INR at [place], III Pharm.D., CACP

## 2013-06-04 NOTE — Patient Instructions (Signed)
Patient instructed to take medications as defined in the Anti-coagulation Track section of this encounter.  Patient instructed to take today's dose.  Patient verbalized understanding of these instructions.    

## 2013-06-05 NOTE — Progress Notes (Signed)
I have reviewed Dr. Gladstone Pih note.  Reason for anticoagulation is DVT.  INR is therapeutic.

## 2013-06-08 ENCOUNTER — Other Ambulatory Visit: Payer: Self-pay | Admitting: *Deleted

## 2013-06-08 DIAGNOSIS — I10 Essential (primary) hypertension: Secondary | ICD-10-CM

## 2013-06-09 MED ORDER — METOPROLOL SUCCINATE 12.5 MG HALF TABLET: 12.5000 mg | ORAL_TABLET | Freq: Every day | ORAL | Status: DC

## 2013-06-11 NOTE — Telephone Encounter (Signed)
Metoprolol rx called to [facility].

## 2013-06-20 ENCOUNTER — Other Ambulatory Visit: Payer: Self-pay | Admitting: *Deleted

## 2013-06-20 MED ORDER — BENAZEPRIL HCL 40 MG PO TABS: 40.0000 mg | ORAL_TABLET | Freq: Every day | ORAL | Status: DC

## 2013-06-25 ENCOUNTER — Ambulatory Visit (INDEPENDENT_AMBULATORY_CARE_PROVIDER_SITE_OTHER): Payer: PRIVATE HEALTH INSURANCE | Admitting: Pharmacist

## 2013-06-25 DIAGNOSIS — Z86718 Personal history of other venous thrombosis and embolism: Secondary | ICD-10-CM

## 2013-06-25 DIAGNOSIS — Z7901 Long term (current) use of anticoagulants: Secondary | ICD-10-CM

## 2013-06-25 LAB — POCT INR: INR: 2.4

## 2013-06-25 MED ORDER — WARFARIN SODIUM 7.5 MG PO TABS: ORAL_TABLET | ORAL | Status: DC

## 2013-06-25 NOTE — Patient Instructions (Signed)
Patient instructed to take medications as defined in the Anti-coagulation Track section of this encounter.  Patient instructed to take today's dose.  Patient verbalized understanding of these instructions.    

## 2013-06-25 NOTE — Progress Notes (Signed)
Anti-Coagulation Progress Note  Chelsea Mcdaniel is a 79 y.o. female who is currently on an anti-coagulation regimen.    RECENT RESULTS: Recent results are below, the most recent result is correlated with a dose of 41.25 mg. per week: Lab Results  Component Value Date   INR 2.40 06/25/2013   INR 2.50 06/04/2013   INR 2.50 05/14/2013    ANTI-COAG DOSE: Anticoagulation Dose Instructions as of 06/25/2013     [name] Tue Wed Thu Fri Sat   New Dose 7.5 mg 3.75 mg 7.5 mg 3.75 mg 7.5 mg 3.75 mg 7.5 mg       ANTICOAG SUMMARY: Anticoagulation Episode Summary   Current INR goal 2.0-3.0  Next INR check 07/23/2013  INR from last check 2.40 (06/25/2013)  Weekly max dose   Target end date Indefinite  [facility]  Preferred lab   Send INR reminders to [facility]   Indications  Long-term (current) use of anticoagulants [V58.61] Personal history of DVT (deep vein thrombosis) [V12.51]        Comments       Anticoagulation Care Providers   Provider Role Specialty Phone number   Acquanetta Chain, DO  Internal Medicine [phone]      ANTICOAG TODAY: Anticoagulation Summary as of 06/25/2013   INR goal 2.0-3.0  Selected INR 2.40 (06/25/2013)  Next INR check 07/23/2013  Target end date Indefinite   Indications  Long-term (current) use of anticoagulants [V58.61] Personal history of DVT (deep vein thrombosis) [V12.51]      Anticoagulation Episode Summary   [facility] check [facility]   Preferred lab    Send INR reminders to [facility]   Comments     Anticoagulation Care Providers   Provider Role Specialty Phone number   Acquanetta Chain, DO  Internal Medicine [phone]      PATIENT INSTRUCTIONS: Patient Instructions  Patient instructed to take medications as defined in the Anti-coagulation Track section of this encounter.  Patient instructed to take today's dose.  Patient verbalized understanding of these instructions.        FOLLOW-UP Return in 4 weeks (on 07/23/2013) for Follow  up INR at [place], III Pharm.D., CACP

## 2013-06-27 ENCOUNTER — Other Ambulatory Visit: Payer: Self-pay | Admitting: *Deleted

## 2013-06-27 MED ORDER — ROSUVASTATIN CALCIUM 5 MG PO TABS: ORAL_TABLET | ORAL | Status: DC

## 2013-07-02 ENCOUNTER — Other Ambulatory Visit: Payer: Self-pay | Admitting: *Deleted

## 2013-07-02 MED ORDER — GLIPIZIDE ER 10 MG PO TB24: ORAL_TABLET | ORAL | Status: DC

## 2013-07-02 NOTE — Telephone Encounter (Signed)
Pt states she's out of med; thought she had requested a refill on the 4th. Thanks

## 2013-07-02 NOTE — Telephone Encounter (Signed)
Pt informed of refill. °

## 2013-07-23 ENCOUNTER — Ambulatory Visit (INDEPENDENT_AMBULATORY_CARE_PROVIDER_SITE_OTHER): Payer: PRIVATE HEALTH INSURANCE | Admitting: Pharmacist

## 2013-07-23 DIAGNOSIS — Z7901 Long term (current) use of anticoagulants: Secondary | ICD-10-CM

## 2013-07-23 DIAGNOSIS — Z86718 Personal history of other venous thrombosis and embolism: Secondary | ICD-10-CM

## 2013-07-23 NOTE — Patient Instructions (Signed)
Patient instructed to take medications as defined in the Anti-coagulation Track section of this encounter.  Patient instructed to take today's dose.  Patient verbalized understanding of these instructions.    

## 2013-07-23 NOTE — Progress Notes (Signed)
Anti-Coagulation Progress Note  Chelsea Mcdaniel is a 79 y.o. female who is currently on an anti-coagulation regimen.    RECENT RESULTS: Recent results are below, the most recent result is correlated with a dose of 41.25 mg. per week: Lab Results  Component Value Date   INR 2.10 07/23/2013   INR 2.40 06/25/2013   INR 2.50 06/04/2013    ANTI-COAG DOSE: Anticoagulation Dose Instructions as of 07/23/2013     [name] Tue Wed Thu Fri Sat   New Dose 7.5 mg 7.5 mg 7.5 mg 3.75 mg 7.5 mg 3.75 mg 7.5 mg       ANTICOAG SUMMARY: Anticoagulation Episode Summary   Current INR goal 2.0-3.0  Next INR check 08/20/2013  INR from last check 2.10 (07/23/2013)  Weekly max dose   Target end date Indefinite  [facility]  Preferred lab   Send INR reminders to [facility]   Indications  Long-term (current) use of anticoagulants [V58.61] Personal history of DVT (deep vein thrombosis) [V12.51]        Comments       Anticoagulation Care Providers   Provider Role Specialty Phone number   Acquanetta Chain, DO  Internal Medicine [phone]      ANTICOAG TODAY: Anticoagulation Summary as of 07/23/2013   INR goal 2.0-3.0  Selected INR 2.10 (07/23/2013)  Next INR check 08/20/2013  Target end date Indefinite   Indications  Long-term (current) use of anticoagulants [V58.61] Personal history of DVT (deep vein thrombosis) [V12.51]      Anticoagulation Episode Summary   [facility] check [facility]   Preferred lab    Send INR reminders to [facility]TICOAG [facility]   Comments     Anticoagulation Care Providers   Provider Role Specialty Phone number   Acquanetta Chain, DO  Internal Medicine [phone]      PATIENT INSTRUCTIONS: Patient Instructions  Patient instructed to take medications as defined in the Anti-coagulation Track section of this encounter.  Patient instructed to take today's dose.  Patient verbalized understanding of these instructions.        FOLLOW-UP Return in 4 weeks (on 08/20/2013) for Follow up INR at [place], III Pharm.D., CACP

## 2013-08-01 ENCOUNTER — Other Ambulatory Visit: Payer: Self-pay | Admitting: *Deleted

## 2013-08-01 DIAGNOSIS — E119 Type 2 diabetes mellitus without complications: Secondary | ICD-10-CM

## 2013-08-02 MED ORDER — SITAGLIPTIN PHOSPHATE 50 MG PO TABS: 50.0000 mg | ORAL_TABLET | Freq: Every day | ORAL | Status: DC

## 2013-08-15 ENCOUNTER — Encounter: Payer: Self-pay | Admitting: Internal Medicine

## 2013-08-15 ENCOUNTER — Ambulatory Visit (INDEPENDENT_AMBULATORY_CARE_PROVIDER_SITE_OTHER): Payer: PRIVATE HEALTH INSURANCE | Admitting: Internal Medicine

## 2013-08-15 VITALS — BP 148/78 | HR 92 | Temp 97.2°F | Ht 62.75 in | Wt 173.1 lb

## 2013-08-15 DIAGNOSIS — Z0000 Encounter for general adult medical examination without abnormal findings: Secondary | ICD-10-CM

## 2013-08-15 DIAGNOSIS — I10 Essential (primary) hypertension: Secondary | ICD-10-CM

## 2013-08-15 DIAGNOSIS — E119 Type 2 diabetes mellitus without complications: Secondary | ICD-10-CM

## 2013-08-15 LAB — POCT GLYCOSYLATED HEMOGLOBIN (HGB A1C): Hemoglobin A1C: 8.1

## 2013-08-15 LAB — GLUCOSE, CAPILLARY: GLUCOSE-CAPILLARY: 203 mg/dL — AB (ref 70–99)

## 2013-08-15 MED ORDER — FUROSEMIDE 40 MG PO TABS: 40.0000 mg | ORAL_TABLET | Freq: Two times a day (BID) | ORAL | Status: DC

## 2013-08-15 NOTE — Assessment & Plan Note (Signed)
Lab Results  Component Value Date   HGBA1C 8.1 08/15/2013   HGBA1C 6.9 04/11/2013   HGBA1C 9.2 10/04/2012     Assessment: Diabetes control: fair control Progress toward A1C goal:  at goal (near goal)  Plan: Medications:  continue current medications - januvia 50, glipizide 10 Home glucose monitoring: Frequency:  daily Timing:  before breakfast Instruction/counseling given: reminded to bring blood glucose meter & log to each visit and reminded to bring medications to each visit Educational resources provided: brochure;handout Self management tools provided: copy of home glucose meter download

## 2013-08-15 NOTE — Progress Notes (Signed)
Subjective:   Patient ID: Chelsea Mcdaniel female   DOB: 10/05/1927 80 y.o.   MRN: [id number]  Chief Complaint  Patient presents with  . Follow-up    Doing well.  . Medication Refill    HPI: [name] is a 80 y.o. woman with history of DM, HTN, HLD, CVA and CKD that presents for routine follow up.   She has no complaints today. She has been out of lasix x1 month because was taking 1 pill qAM and 2pill qPM and could only get 90 - interestingly, she was given 40mg  pills from the pharmacy, but our records had 20mg  pills.  She denies dizziness or lightheadedness.  Review of home BPs suggest SBPs as high as 190, but better controlled in Nov/Dec (SBP 107-160) when she had lasix.    Regarding DM, she checks CBGs daily, no s/s of hypoglycemia. Min 107, Max 238, avg 178.   HM: Tdap, pneumonia, flu, foot, A1c  Review of Systems: Constitutional: Denies fever, chills, diaphoresis, appetite change and fatigue.  HEENT: Denies photophobia, eye pain, redness, hearing loss, ear pain, congestion, sore throat, rhinorrhea, sneezing, mouth sores, trouble swallowing, neck pain, neck stiffness and tinnitus.  Respiratory: Denies SOB, DOE, cough, chest tightness, and wheezing.  Cardiovascular: Denies chest pain, palpitations and leg swelling.  Gastrointestinal: Denies nausea, vomiting, abdominal pain, diarrhea, constipation,blood in stool and abdominal distention.  Genitourinary: Denies dysuria, urgency, frequency, hematuria, flank pain and difficulty urinating.   Skin: Denies pallor, rash and wound.  Neurological: Denies dizziness, seizures, syncope, weakness, lightheadedness, numbness and headaches.     Past Medical History  Diagnosis Date  . Diabetes mellitus   . Hyperlipidemia   . Hypertension   . CVA (cerebrovascular accident)  October 2007     CT of the head atrophy with multiple remote insults noted but no definite acute findings  . Blindness of left eye      likely related to stroke, left  cataract removed from that eye with complications  . Anemia      normocytic anemia with baseline hemoglobin 10-11  . Chronic kidney disease 2006     left renal artery stenosis with probable hemodynamic significance, kidneys are normal in morphology without focal lesions or hydronephrosis this is based but cannot A. of the abdomen with and without contrast done on the 31st 2006  . Glaucoma    Current Outpatient Prescriptions  Medication Sig Dispense Refill  . amLODipine (NORVASC) 10 MG tablet Take 1 tablet (10 mg total) by mouth daily. For blood pressure  90 tablet  1  . benazepril (LOTENSIN) 40 MG tablet Take 1 tablet (40 mg total) by mouth daily.  90 tablet  1  . Calcium-Vitamin D (CHEWABLE CALCIUM/D) 300 MG CHEW Chew by mouth. Take as directed       . colchicine 0.6 MG tablet Take 1 tablet (0.6 mg total) by mouth 2 (two) times daily as needed. For gout flare up  30 tablet  1  . diclofenac sodium (VOLTAREN) 1 % GEL Apply 1 application topically 2 (two) times daily. To knees  1 Tube  5  . furosemide (LASIX) 20 MG tablet Take 40mg  (2 tablets) in the morning, and 20mg  (1 tablet) with supper.  90 tablet  11  . glipiZIDE (GLUCOTROL XL) 10 MG 24 hr tablet TAKE ONE TABLET BY MOUTH EVERY DAY  90 tablet  2  . indomethacin (INDOCIN) 25 MG capsule Take 1 capsule (25 mg total) by mouth 3 (three) times daily. For 5  days  90 capsule  3  . latanoprost (XALATAN) 0.005 % ophthalmic solution       . metoprolol succinate (TOPROL-XL) 12.5 mg TB24 24 hr tablet Take 0.5 tablets (12.5 mg total) by mouth daily.  90 tablet  4  . rosuvastatin (CRESTOR) 5 MG tablet TAKE ONE TABLET BY MOUTH AT BEDTIME  90 tablet  2  . sitaGLIPtin (JANUVIA) 50 MG tablet Take 1 tablet (50 mg total) by mouth daily.  90 tablet  4  . warfarin (COUMADIN) 7.5 MG tablet Take as directed by anticoagulation clinic provider. Take 1/2 tablet daily Monday/Wednesday; 1 tablet all other days.  30 tablet  2   No current facility-administered  medications for this visit.   Family History  Problem Relation Age of Onset  . Heart disease Mother   . Diabetes Mother   . Hyperlipidemia Mother   . Hypertension Mother   . Heart disease Father   . Diabetes Father   . Hyperlipidemia Father   . Hypertension Father    History   Social History  . Marital Status: Widowed    Spouse Name: N/A    Number of Children: N/A  . Years of Education: N/A   Social History Main Topics  . Smoking status: Never Smoker   . Smokeless tobacco: None  . Alcohol Use: No  . Drug Use: No  . Sexual Activity: None   Other Topics Concern  . None   Social History Narrative    Patient is a widow. She has 11 children 5 of who are living. She is retired in 1993 from [employer]. She denies tobacco alcohol or drug use.    Objective:  Physical Exam: Filed Vitals:   08/15/13 1423  BP: 148/78  Pulse: 92  Temp: 97.2 F (36.2 C)  TempSrc: Oral  Height: 5' 2.75" (1.594 m)  Weight: 173 lb 1.6 oz (78.518 kg)  SpO2: 98%   General: pleasant, appears as stated age  HEENT: nonreactive left pupil (s/p stroke)  Cardiac: RRR, no rubs, murmurs or gallops  Pulm: clear to auscultation bilaterally, moving normal volumes of air  Abd: soft, nontender, nondistended, BS normoactive  Ext: warm and well perfused Neuro: alert and oriented X3, cranial nerves II-XII grossly intact   Assessment & Plan:  Case and care discussed with Dr. Lynnae January.  Please see problem oriented charting for further details. Patient to return in 3 months for routine f/u.

## 2013-08-15 NOTE — Assessment & Plan Note (Signed)
Patient declines all vaccinations

## 2013-08-15 NOTE — Assessment & Plan Note (Signed)
BP Readings from Last 3 Encounters:  08/15/13 148/78  04/11/13 174/72  10/04/12 130/61    Lab Results  Component Value Date   NA 139 04/11/2013   K 5.2 04/11/2013   CREATININE 1.65* 04/11/2013    Assessment: Blood pressure control:   mildly elevated Progress toward BP goal:   improved Comments: Out of lasix x 1 month - had been taking 40 qAM and 80  qPM  Plan: Medications:  continue amlodipine 10, benazepril 40 ,restart lasix at 40 BID and check BMET tdoay Educational resources provided: brochure;handout;video

## 2013-08-15 NOTE — Patient Instructions (Signed)
General Instructions:  -Continue taking your diabetes medications as you have been -Regarding your blood pressure, continue amlodipine 10mg  daily and benazepril 40mg  daily, and restart lasix (also called furosemide, your water pill) 40mg  twice daily  -Thanks for bringing your medications and your blood pressure and glucose logs!!!!  Please be sure to bring all of your medications with you to every visit.  Should you have any new or worsening symptoms, please be sure to call the clinic at [phone].   Treatment Goals:  Goals (1 Years of Data) as of 08/15/13         As of Today 04/11/13 10/04/12 05/17/12 12/10/10     Blood Pressure    . Blood Pressure < 140/90  148/78 174/72 130/61 152/74      Result Component    . HEMOGLOBIN A1C < 8.0  8.1 6.9 9.2 6.9     . LDL CALC < 100    69  74      Progress Toward Treatment Goals:  Treatment Goal 08/15/2013  Hemoglobin A1C at goal  Blood pressure improved    Self Care Goals & Plans:  Self Care Goal 08/15/2013  Manage my medications take my medicines as prescribed; bring my medications to every visit; refill my medications on time; follow the sick day instructions if I am sick  Monitor my health keep track of my blood glucose; bring my glucose meter and log to each visit; keep track of my blood pressure; check my feet daily  Eat healthy foods eat more vegetables; eat fruit for snacks and desserts; eat foods that are low in salt; eat smaller portions; drink diet soda or water instead of juice or soda  Be physically active find an activity I enjoy; take a walk every day  Meeting treatment goals maintain the current self-care plan    Home Blood Glucose Monitoring 10/04/2012  Check my blood sugar once a day  When to check my blood sugar before breakfast

## 2013-08-16 LAB — BASIC METABOLIC PANEL
BUN: 28 mg/dL — AB (ref 6–23)
CO2: 25 mEq/L (ref 19–32)
Calcium: 8.9 mg/dL (ref 8.4–10.5)
Chloride: 107 mEq/L (ref 96–112)
Creat: 1.46 mg/dL — ABNORMAL HIGH (ref 0.50–1.10)
Glucose, Bld: 189 mg/dL — ABNORMAL HIGH (ref 70–99)
Potassium: 4.5 mEq/L (ref 3.5–5.3)
Sodium: 138 mEq/L (ref 135–145)

## 2013-08-16 NOTE — Progress Notes (Signed)
Case discussed with Dr. Sharda soon after the resident saw the patient.  We reviewed the resident's history and exam and pertinent patient test results.  I agree with the assessment, diagnosis, and plan of care documented in the resident's note. 

## 2013-08-20 ENCOUNTER — Ambulatory Visit: Payer: PRIVATE HEALTH INSURANCE

## 2013-08-27 ENCOUNTER — Ambulatory Visit (INDEPENDENT_AMBULATORY_CARE_PROVIDER_SITE_OTHER): Payer: PRIVATE HEALTH INSURANCE | Admitting: Pharmacist

## 2013-08-27 DIAGNOSIS — Z86718 Personal history of other venous thrombosis and embolism: Secondary | ICD-10-CM

## 2013-08-27 DIAGNOSIS — Z7901 Long term (current) use of anticoagulants: Secondary | ICD-10-CM

## 2013-08-27 LAB — POCT INR: INR: 2.3

## 2013-08-27 MED ORDER — WARFARIN SODIUM 7.5 MG PO TABS: ORAL_TABLET | ORAL | Status: DC

## 2013-08-27 NOTE — Progress Notes (Signed)
Anti-Coagulation Progress Note  Chelsea Mcdaniel is a 80 y.o. female who is currently on an anti-coagulation regimen.    RECENT RESULTS: Recent results are below, the most recent result is correlated with a dose of 45 mg. per week: Lab Results  Component Value Date   INR 2.30 08/27/2013   INR 2.10 07/23/2013   INR 2.40 06/25/2013    ANTI-COAG DOSE: Anticoagulation Dose Instructions as of 08/27/2013     Sun Mon Tue Wed Thu Fri Sat   New Dose 7.5 mg 7.5 mg 7.5 mg 3.75 mg 7.5 mg 7.5 mg 7.5 mg       ANTICOAG SUMMARY: Anticoagulation Episode Summary   Current INR goal 2.0-3.0  Next INR check 09/24/2013  INR from last check 2.30 (08/27/2013)  Weekly max dose   Target end date Indefinite  [facility] check location [facility]  Preferred lab   Send INR reminders to ANTICOAG [facility]   Indications  Long-term (current) use of anticoagulants [V58.61] Personal history of DVT (deep vein thrombosis) [V12.51]        Comments       Anticoagulation Care Providers   Provider Role Specialty Phone number   Acquanetta Chain, DO  Internal Medicine [phone]      ANTICOAG TODAY: Anticoagulation Summary as of 08/27/2013   INR goal 2.0-3.0  Selected INR 2.30 (08/27/2013)  Next INR check 09/24/2013  Target end date Indefinite   Indications  Long-term (current) use of anticoagulants [V58.61] Personal history of DVT (deep vein thrombosis) [V12.51]      Anticoagulation Episode Summary   [facility] check location [facility]   Preferred lab    Send INR reminders to ANTICOAG [facility]   Comments     Anticoagulation Care Providers   Provider Role Specialty Phone number   Acquanetta Chain, DO  Internal Medicine [phone]      PATIENT INSTRUCTIONS: Patient Instructions  Patient instructed to take medications as defined in the Anti-coagulation Track section of this encounter.  Patient instructed to take today's dose.  Patient verbalized understanding of these instructions.       FOLLOW-UP Return in  4 weeks (on 09/24/2013) for Follow up INR at 4:15PM.  [name], III Pharm.D., CACP

## 2013-08-27 NOTE — Patient Instructions (Signed)
Patient instructed to take medications as defined in the Anti-coagulation Track section of this encounter.  Patient instructed to take today's dose.  Patient verbalized understanding of these instructions.    

## 2013-09-24 ENCOUNTER — Ambulatory Visit: Payer: PRIVATE HEALTH INSURANCE

## 2013-10-01 ENCOUNTER — Other Ambulatory Visit: Payer: Self-pay | Admitting: *Deleted

## 2013-10-01 ENCOUNTER — Ambulatory Visit (INDEPENDENT_AMBULATORY_CARE_PROVIDER_SITE_OTHER): Payer: PRIVATE HEALTH INSURANCE | Admitting: Pharmacist

## 2013-10-01 DIAGNOSIS — Z86718 Personal history of other venous thrombosis and embolism: Secondary | ICD-10-CM

## 2013-10-01 DIAGNOSIS — Z7901 Long term (current) use of anticoagulants: Secondary | ICD-10-CM

## 2013-10-02 LAB — POCT INR: INR: 2.5

## 2013-10-02 NOTE — Progress Notes (Signed)
Anti-Coagulation Progress Note  Chelsea Mcdaniel is a 80 y.o. female who is currently on an anti-coagulation regimen.    RECENT RESULTS: Recent results are below, the most recent result is correlated with a dose of 48.75 mg. per week: Lab Results  Component Value Date   INR 2.50 10/02/2013   INR 2.30 08/27/2013   INR 2.10 07/23/2013    ANTI-COAG DOSE: Anticoagulation Dose Instructions as of 10/01/2013     Sun Mon Tue Wed Thu Fri Sat   New Dose 7.5 mg 7.5 mg 7.5 mg 3.75 mg 7.5 mg 7.5 mg 7.5 mg       ANTICOAG SUMMARY: Anticoagulation Episode Summary   Current INR goal 2.0-3.0  Next INR check 10/29/2013  INR from last check 2.30 (08/27/2013)  Most recent INR 2.50 (10/02/2013)  Weekly max dose   Target end date Indefinite  [facility] check [facility]  Preferred lab   Send INR reminders to ANTICOAG [facility]   Indications  Long-term (current) use of anticoagulants [V58.61] Personal history of DVT (deep vein thrombosis) [V12.51]        Comments       Anticoagulation Care Providers   Provider Role Specialty Phone number   Acquanetta Chain, DO  Internal Medicine [phone]      ANTICOAG TODAY: Anticoagulation Summary as of 10/01/2013   INR goal 2.0-3.0  Selected INR 2.30 (08/27/2013)  Next INR check 10/29/2013  Target end date Indefinite   Indications  Long-term (current) use of anticoagulants [V58.61] Personal history of DVT (deep vein thrombosis) [V12.51]      Anticoagulation Episode Summary   [facility] check location [facility]   Preferred lab    Send INR reminders to ANTICOAG [facility]   Comments     Anticoagulation Care Providers   Provider Role Specialty Phone number   Acquanetta Chain, DO  Internal Medicine [phone]      PATIENT INSTRUCTIONS: Patient Instructions  Patient instructed to take medications as defined in the Anti-coagulation Track section of this encounter.  Patient instructed to take today's dose.  Patient verbalized understanding of these  instructions.       FOLLOW-UP Return in 4 weeks (on 10/29/2013) for Follow up INR at 3:30PM.  [name], III Pharm.D., CACP

## 2013-10-02 NOTE — Patient Instructions (Signed)
Patient instructed to take medications as defined in the Anti-coagulation Track section of this encounter.  Patient instructed to take today's dose.  Patient verbalized understanding of these instructions.    

## 2013-10-03 MED ORDER — COLCHICINE 0.6 MG PO TABS: ORAL_TABLET | ORAL | Status: DC

## 2013-10-29 ENCOUNTER — Ambulatory Visit (INDEPENDENT_AMBULATORY_CARE_PROVIDER_SITE_OTHER): Payer: PRIVATE HEALTH INSURANCE | Admitting: Pharmacist

## 2013-10-29 DIAGNOSIS — Z86718 Personal history of other venous thrombosis and embolism: Secondary | ICD-10-CM

## 2013-10-29 DIAGNOSIS — Z7901 Long term (current) use of anticoagulants: Secondary | ICD-10-CM

## 2013-10-29 LAB — POCT INR: INR: 1.8

## 2013-10-29 NOTE — Progress Notes (Signed)
INTERNAL MEDICINE TEACHING ATTENDING ADDENDUM - Aldine Contes M.D  Duration- indefinite, Indication- DVT, INR- subtherapeutic. Agree with Dr. Gladstone Pih recommendations as outlined in his note. Pt to f/u in 14 days for repeat INR

## 2013-10-29 NOTE — Patient Instructions (Signed)
Patient instructed to take medications as defined in the Anti-coagulation Track section of this encounter.  Patient instructed to take today's dose.  Patient verbalized understanding of these instructions.    

## 2013-10-29 NOTE — Progress Notes (Signed)
Anti-Coagulation Progress Note  Chelsea Mcdaniel is a 80 y.o. female who is currently on an anti-coagulation regimen.    RECENT RESULTS: Recent results are below, the most recent result is correlated with a dose of 48.75 mg. per week: Lab Results  Component Value Date   INR 1.80 10/29/2013   INR 2.50 10/02/2013   INR 2.30 08/27/2013    ANTI-COAG DOSE: Anticoagulation Dose Instructions as of 10/29/2013     Sun Mon Tue Wed Thu Fri Sat   New Dose 7.5 mg 7.5 mg 7.5 mg 7.5 mg 7.5 mg 7.5 mg 7.5 mg       ANTICOAG SUMMARY: Anticoagulation Episode Summary   Current INR goal 2.0-3.0  Next INR check 11/12/2013  INR from last check 1.80! (10/29/2013)  Weekly max dose   Target end date Indefinite  [facility] check [facility]  Preferred lab   Send INR reminders to ANTICOAG [facility]   Indications  Long-term (current) use of anticoagulants [V58.61] Personal history of DVT (deep vein thrombosis) [V12.51]        Comments       Anticoagulation Care Providers   Provider Role Specialty Phone number   Acquanetta Chain, DO  Internal Medicine [phone]      ANTICOAG TODAY: Anticoagulation Summary as of 10/29/2013   INR goal 2.0-3.0  Selected INR 1.80! (10/29/2013)  Next INR check 11/12/2013  Target end date Indefinite   Indications  Long-term (current) use of anticoagulants [V58.61] Personal history of DVT (deep vein thrombosis) [V12.51]      Anticoagulation Episode Summary   [facility] check [facility]   Preferred lab    Send INR reminders to ANTICOAG [facility]   Comments     Anticoagulation Care Providers   Provider Role Specialty Phone number   Acquanetta Chain, DO  Internal Medicine [phone]      PATIENT INSTRUCTIONS: Patient Instructions  Patient instructed to take medications as defined in the Anti-coagulation Track section of this encounter.  Patient instructed to take today's dose.  Patient verbalized understanding of these instructions.        FOLLOW-UP Return in 2 weeks (on 11/12/2013) for Follow up INR at [place], III Pharm.D., CACP

## 2013-10-30 ENCOUNTER — Other Ambulatory Visit: Payer: Self-pay | Admitting: *Deleted

## 2013-10-30 DIAGNOSIS — M109 Gout, unspecified: Secondary | ICD-10-CM

## 2013-10-30 NOTE — Telephone Encounter (Signed)
Last filled 4/7 (written 3/11 with 1 refilled)

## 2013-11-01 DIAGNOSIS — M1A30X0 Chronic gout due to renal impairment, unspecified site, without tophus (tophi): Secondary | ICD-10-CM | POA: Insufficient documentation

## 2013-11-01 MED ORDER — COLCHICINE 0.6 MG PO TABS: ORAL_TABLET | ORAL | Status: DC

## 2013-11-01 NOTE — Assessment & Plan Note (Signed)
Patient with history of gout-  Requesting refill for colchicine.  Given her renal function, I called to make sure she is not taking medication more than every 2 weeks - her last flare was in March, and prior to that, she cannot recall.  She assures me she does not take medication frequently but likes to keep it on hand.  Will send refill today.

## 2013-11-12 ENCOUNTER — Ambulatory Visit (INDEPENDENT_AMBULATORY_CARE_PROVIDER_SITE_OTHER): Payer: PRIVATE HEALTH INSURANCE | Admitting: Pharmacist

## 2013-11-12 DIAGNOSIS — Z7901 Long term (current) use of anticoagulants: Secondary | ICD-10-CM

## 2013-11-12 DIAGNOSIS — Z86718 Personal history of other venous thrombosis and embolism: Secondary | ICD-10-CM

## 2013-11-12 LAB — POCT INR: INR: 3.3

## 2013-11-12 NOTE — Progress Notes (Signed)
Anti-Coagulation Progress Note  Chelsea Mcdaniel is a 80 y.o. female who is currently on an anti-coagulation regimen.    RECENT RESULTS: Recent results are below, the most recent result is correlated with a dose of 52.5 mg. per week: Lab Results  Component Value Date   INR 3.30 11/12/2013   INR 1.80 10/29/2013   INR 2.50 10/02/2013    ANTI-COAG DOSE: Anticoagulation Dose Instructions as of 11/12/2013     Sun Mon Tue Wed Thu Fri Sat   New Dose 7.5 mg 3.75 mg 7.5 mg 7.5 mg 7.5 mg 7.5 mg 7.5 mg       ANTICOAG SUMMARY: Anticoagulation Episode Summary   Current INR goal 2.0-3.0  Next INR check 12/03/2013  INR from last check 3.30! (11/12/2013)  Weekly max dose   Target end date Indefinite  [facility] check location [facility]  Preferred lab   Send INR reminders to ANTICOAG [facility]   Indications  Long-term (current) use of anticoagulants [V58.61] Personal history of DVT (deep vein thrombosis) [V12.51]        Comments       Anticoagulation Care Providers   Provider Role Specialty Phone number   Acquanetta Chain, DO  Internal Medicine [phone]      ANTICOAG TODAY: Anticoagulation Summary as of 11/12/2013   INR goal 2.0-3.0  Selected INR 3.30! (11/12/2013)  Next INR check 12/03/2013  Target end date Indefinite   Indications  Long-term (current) use of anticoagulants [V58.61] Personal history of DVT (deep vein thrombosis) [V12.51]      Anticoagulation Episode Summary   [facility] check [facility]   Preferred lab    Send INR reminders to ANTICOAG [facility]   Comments     Anticoagulation Care Providers   Provider Role Specialty Phone number   Acquanetta Chain, DO  Internal Medicine [phone]      PATIENT INSTRUCTIONS: Patient Instructions  Patient instructed to take medications as defined in the Anti-coagulation Track section of this encounter.  Patient instructed to take today's dose.  Patient verbalized understanding of these instructions.        FOLLOW-UP Return in 3 weeks (on 12/03/2013) for Follow up INR at [place], III Pharm.D., CACP

## 2013-11-12 NOTE — Patient Instructions (Signed)
Patient instructed to take medications as defined in the Anti-coagulation Track section of this encounter.  Patient instructed to take today's dose.  Patient verbalized understanding of these instructions.    

## 2013-11-13 NOTE — Progress Notes (Signed)
[name] has had an unprovoked DVT as well as several CVAs with failure of ASA/plavix and a CEA, for this reason, patient has been on chronic anticoag but with plans to reassess her risks at each visit given her age and vision impairment.

## 2013-11-13 NOTE — Progress Notes (Signed)
Indication: History of deep venous thrombosis.  Duration: Unclear.  INR: Above target.  Agree with Chelsea Mcdaniel assessment and plan.  It is unclear if Chelsea Mcdaniel has been informed of the recommendations on anticoagulation duration for her deep venous thrombosis so that she can make a decision on continuing if she feels this is in her best interest.  I will ask Chelsea Mcdaniel to address with Chelsea Mcdaniel at the next follow-up visit.

## 2013-12-03 ENCOUNTER — Ambulatory Visit (INDEPENDENT_AMBULATORY_CARE_PROVIDER_SITE_OTHER): Payer: PRIVATE HEALTH INSURANCE | Admitting: Pharmacist

## 2013-12-03 DIAGNOSIS — Z86718 Personal history of other venous thrombosis and embolism: Secondary | ICD-10-CM

## 2013-12-03 DIAGNOSIS — Z7901 Long term (current) use of anticoagulants: Secondary | ICD-10-CM

## 2013-12-03 LAB — POCT INR: INR: 3.6

## 2013-12-03 NOTE — Progress Notes (Signed)
Anti-Coagulation Progress Note  Chelsea Mcdaniel is a 80 y.o. female who is currently on an anti-coagulation regimen.    RECENT RESULTS: Recent results are below, the most recent result is correlated with a dose of 45.75 mg. per week: Lab Results  Component Value Date   INR 3.60 12/03/2013   INR 3.30 11/12/2013   INR 1.80 10/29/2013    ANTI-COAG DOSE: Anticoagulation Dose Instructions as of 12/03/2013     [name] Tue Wed Thu Fri Sat   New Dose 7.5 mg 3.75 mg 7.5 mg 3.75 mg 7.5 mg 7.5 mg 7.5 mg       ANTICOAG SUMMARY: Anticoagulation Episode Summary   Current INR goal 2.0-3.0  Next INR check 12/31/2013  INR from last check 3.60! (12/03/2013)  Weekly max dose   Target end date Indefinite  [facility] check [facility]  Preferred lab   Send INR reminders to ANTICOAG [facility]   Indications  Long-term (current) use of anticoagulants [V58.61] Personal history of DVT (deep vein thrombosis) [V12.51]        Comments       Anticoagulation Care Providers   Provider Role Specialty Phone number   Acquanetta Chain, DO  Internal Medicine [phone]      ANTICOAG TODAY: Anticoagulation Summary as of 12/03/2013   INR goal 2.0-3.0  Selected INR 3.60! (12/03/2013)  Next INR check 12/31/2013  Target end date Indefinite   Indications  Long-term (current) use of anticoagulants [V58.61] Personal history of DVT (deep vein thrombosis) [V12.51]      Anticoagulation Episode Summary   [facility] check [facility]   Preferred lab    Send INR reminders to ANTICOAG [facility]   Comments     Anticoagulation Care Providers   Provider Role Specialty Phone number   Acquanetta Chain, DO  Internal Medicine [phone]      PATIENT INSTRUCTIONS: Patient Instructions  Patient instructed to take medications as defined in the Anti-coagulation Track section of this encounter.  Patient instructed to OMIT/HOLD today's dose.  Patient verbalized understanding of these instructions.        FOLLOW-UP Return in 4 weeks (on 12/31/2013) for Follow up INR at [place], III Pharm.D., CACP

## 2013-12-03 NOTE — Patient Instructions (Signed)
Patient instructed to take medications as defined in the Anti-coagulation Track section of this encounter.  Patient instructed to OMIT/HOLD today's dose.  Patient verbalized understanding of these instructions.    

## 2013-12-04 NOTE — Progress Notes (Signed)
Indication: History of deep venous thrombosis. Duration: Unclear. INR: Above target. Agree with Dr. Gladstone Pih assessment and plan.  It is unclear if Chelsea Mcdaniel has been informed of the recommendations on anticoagulation duration for her deep venous thrombosis so that she can make a decision on continuing if she feels this is in her best interest. This will be discussed with Dr. Burnard Bunting at the follow-up visit scheduled for May 21.

## 2013-12-06 ENCOUNTER — Encounter: Payer: Self-pay | Admitting: Internal Medicine

## 2013-12-13 ENCOUNTER — Encounter: Payer: Self-pay | Admitting: Internal Medicine

## 2013-12-13 ENCOUNTER — Ambulatory Visit (INDEPENDENT_AMBULATORY_CARE_PROVIDER_SITE_OTHER): Payer: PRIVATE HEALTH INSURANCE | Admitting: Internal Medicine

## 2013-12-13 VITALS — BP 167/74 | HR 61 | Temp 97.1°F | Wt 162.9 lb

## 2013-12-13 DIAGNOSIS — I10 Essential (primary) hypertension: Secondary | ICD-10-CM

## 2013-12-13 DIAGNOSIS — N183 Chronic kidney disease, stage 3 unspecified: Secondary | ICD-10-CM

## 2013-12-13 DIAGNOSIS — E785 Hyperlipidemia, unspecified: Secondary | ICD-10-CM

## 2013-12-13 DIAGNOSIS — I129 Hypertensive chronic kidney disease with stage 1 through stage 4 chronic kidney disease, or unspecified chronic kidney disease: Secondary | ICD-10-CM

## 2013-12-13 DIAGNOSIS — E119 Type 2 diabetes mellitus without complications: Secondary | ICD-10-CM

## 2013-12-13 LAB — COMPREHENSIVE METABOLIC PANEL
ALBUMIN: 3.8 g/dL (ref 3.5–5.2)
ALT: 8 U/L (ref 0–35)
AST: 16 U/L (ref 0–37)
Alkaline Phosphatase: 87 U/L (ref 39–117)
BUN: 34 mg/dL — AB (ref 6–23)
CHLORIDE: 102 meq/L (ref 96–112)
CO2: 26 meq/L (ref 19–32)
CREATININE: 1.4 mg/dL — AB (ref 0.50–1.10)
Calcium: 9.4 mg/dL (ref 8.4–10.5)
Glucose, Bld: 151 mg/dL — ABNORMAL HIGH (ref 70–99)
POTASSIUM: 4.6 meq/L (ref 3.5–5.3)
Sodium: 137 mEq/L (ref 135–145)
Total Bilirubin: 0.5 mg/dL (ref 0.2–1.2)
Total Protein: 7 g/dL (ref 6.0–8.3)

## 2013-12-13 LAB — CBC
HCT: 33.4 % — ABNORMAL LOW (ref 36.0–46.0)
HEMOGLOBIN: 10.8 g/dL — AB (ref 12.0–15.0)
MCH: 29.3 pg (ref 26.0–34.0)
MCHC: 32.3 g/dL (ref 30.0–36.0)
MCV: 90.5 fL (ref 78.0–100.0)
Platelets: 272 10*3/uL (ref 150–400)
RBC: 3.69 MIL/uL — AB (ref 3.87–5.11)
RDW: 13.3 % (ref 11.5–15.5)
WBC: 5.5 10*3/uL (ref 4.0–10.5)

## 2013-12-13 LAB — GLUCOSE, CAPILLARY: GLUCOSE-CAPILLARY: 170 mg/dL — AB (ref 70–99)

## 2013-12-13 LAB — POCT GLYCOSYLATED HEMOGLOBIN (HGB A1C): Hemoglobin A1C: 6.9

## 2013-12-13 MED ORDER — AMLODIPINE BESYLATE 10 MG PO TABS: 10.0000 mg | ORAL_TABLET | Freq: Every day | ORAL | Status: DC

## 2013-12-13 NOTE — Patient Instructions (Signed)
General Instructions: Your blood pressure medications include : Metoprolol 12.5mg  (half a pill) daily, Furosemide 40mg  twice daily (water pill), Benazepril 40mg  daily and amlodipine 10mg  daily - you don't have the amlodipine with you today, please get this medication from the pharmacy!  Your diabetes is doing great! You are taking glipizide 10mg  daily and januvia 50mg  daily for this - keep it up!  For cholesterol, you are taking crestor 5mg  daily.  Thank you for bringing your medicines today. This helps [place] keep you safe from mistakes.  Please be sure to bring all of your medications with you to every visit.  Should you have any new or worsening symptoms, please be sure to call the clinic at [phone].    Progress Toward Treatment Goals:  Treatment Goal 12/13/2013  Hemoglobin A1C at goal  Blood pressure -    Self Care Goals & Plans:  Self Care Goal 12/13/2013  Manage my medications -  Monitor my health -  Eat healthy foods -  Be physically active -  Meeting treatment goals maintain the current self-care plan    Home Blood Glucose Monitoring 12/13/2013  Check my blood sugar no home glucose monitoring  When to check my blood sugar -

## 2013-12-13 NOTE — Assessment & Plan Note (Signed)
Continue statin, cmet & lipid panel today

## 2013-12-13 NOTE — Assessment & Plan Note (Signed)
Renal function and Hb today.

## 2013-12-13 NOTE — Assessment & Plan Note (Signed)
Lab Results  Component Value Date   HGBA1C 6.9 12/13/2013   HGBA1C 8.1 08/15/2013   HGBA1C 6.9 04/11/2013     Assessment: Diabetes control: good control (HgbA1C at goal) Progress toward A1C goal:  at goal  Plan: Medications:  continue current medications - glipizide 10, januvia 50 Home glucose monitoring: Frequency: no home glucose monitoring Timing:   Instruction/counseling given: reminded to bring medications to each visit

## 2013-12-13 NOTE — Assessment & Plan Note (Signed)
BP Readings from Last 3 Encounters:  12/13/13 167/74  08/15/13 148/78  04/11/13 174/72    Lab Results  Component Value Date   NA 138 08/15/2013   K 4.5 08/15/2013   CREATININE 1.46* 08/15/2013    Assessment: Blood pressure control: moderately elevated Progress toward BP goal:   deteriorated  Comments: did not have amlodipine medication  Plan: Medications:  continue current medications - Benazepril 40, lasix 40 bid, metoprolol 12.5mg  daily, restart amlodipine 10 Other plans: CMET, CBC today

## 2013-12-13 NOTE — Progress Notes (Signed)
Subjective:   Patient ID: Chelsea Mcdaniel female   DOB: [birth year] 80 y.o.   MRN: [id number]   HPI: [name] is a 80 y.o. woman with history of DM, HTN, HLD, CVA and CKD that presents for routine follow up.   Hasn't been checking CBG because no aid currently.  No s/s of hypoglycemia such as dizziness, heart palpitations, light headedness, confusion.  No s/s of hyperglycemia such as polyuria or polydipsia.    INR 3.6 on 12/03/13  No falls ,sleeping well.  No recent gout flare.  Review of Systems: Constitutional: Denies fever, chills, diaphoresis, appetite change and fatigue.  HEENT: Denies photophobia, eye pain, redness, hearing loss, ear pain, congestion, sore throat, rhinorrhea, sneezing, mouth sores, trouble swallowing, neck pain, neck stiffness and tinnitus.  Respiratory: Denies SOB, DOE, cough, chest tightness, and wheezing.  Cardiovascular: Denies chest pain, palpitations and leg swelling.  Gastrointestinal: Denies nausea, vomiting, abdominal pain, diarrhea, constipation,blood in stool and abdominal distention.  Genitourinary: Denies dysuria, urgency, frequency, hematuria, flank pain and difficulty urinating.  Musculoskeletal: Denies myalgias, back pain, joint swelling, arthralgias and gait problem.  Skin: Denies pallor, rash and wound.  Neurological: Denies dizziness, seizures, syncope, weakness, lightheadedness, numbness and headaches.  Hematological: No obvious s/s of bleeding   Past Medical History  Diagnosis Date  . Diabetes mellitus   . Hyperlipidemia   . Hypertension   . CVA (cerebrovascular accident)  October 2007     CT of the head atrophy with multiple remote insults noted but no definite acute findings  . Blindness of left eye      likely related to stroke, left cataract removed from that eye with complications  . Anemia      normocytic anemia with baseline hemoglobin 10-11  . Chronic kidney disease 2006     left renal artery stenosis with probable  hemodynamic significance, kidneys are normal in morphology without focal lesions or hydronephrosis this is based but cannot A. of the abdomen with and without contrast done on the 31st 2006  . Glaucoma   . Personal history of DVT (deep vein thrombosis) 08/11/2010   Current Outpatient Prescriptions  Medication Sig Dispense Refill  . amLODipine (NORVASC) 10 MG tablet Take 1 tablet (10 mg total) by mouth daily. For blood pressure  90 tablet  1  . benazepril (LOTENSIN) 40 MG tablet Take 1 tablet (40 mg total) by mouth daily.  90 tablet  1  . Calcium-Vitamin D (CHEWABLE CALCIUM/D) 300 MG CHEW Chew by mouth. Take as directed       . colchicine 0.6 MG tablet Initial 1.2mg  (2 tabs) at first sign of gout flare, followed in 1 hour with 0.6mg   10 tablet  1  . diclofenac sodium (VOLTAREN) 1 % GEL Apply 1 application topically 2 (two) times daily. To knees  1 Tube  5  . furosemide (LASIX) 40 MG tablet Take 1 tablet (40 mg total) by mouth 2 (two) times daily. Take 40mg  (2 tablets) in the morning, and 20mg  (1 tablet) with supper.  90 tablet  11  . glipiZIDE (GLUCOTROL XL) 10 MG 24 hr tablet TAKE ONE TABLET BY MOUTH EVERY DAY  90 tablet  2  . latanoprost (XALATAN) 0.005 % ophthalmic solution       . metoprolol succinate (TOPROL-XL) 12.5 mg TB24 24 hr tablet Take 0.5 tablets (12.5 mg total) by mouth daily.  90 tablet  4  . rosuvastatin (CRESTOR) 5 MG tablet TAKE ONE TABLET BY MOUTH AT BEDTIME  90 tablet  2  . sitaGLIPtin (JANUVIA) 50 MG tablet Take 1 tablet (50 mg total) by mouth daily.  90 tablet  4  . warfarin (COUMADIN) 7.5 MG tablet Take as directed by anticoagulation clinic provider. Take 1/2 tablet on Wednesdays; 1 tablet all other days.  30 tablet  2   No current facility-administered medications for this visit.   Family History  Problem Relation Age of Onset  . Heart disease Mother   . Diabetes Mother   . Hyperlipidemia Mother   . Hypertension Mother   . Heart disease Father   . Diabetes Father     . Hyperlipidemia Father   . Hypertension Father    History   Social History  . Marital Status: Widowed    Spouse Name: N/A    Number of Children: N/A  . Years of Education: N/A   Social History Main Topics  . Smoking status: Never Smoker   . Smokeless tobacco: None  . Alcohol Use: No  . Drug Use: No  . Sexual Activity: None   Other Topics Concern  . None   Social History Narrative    Patient is a widow. She has 11 children 5 of who are living. She is retired in 1993 from [employer]. She denies tobacco alcohol or drug use.    Objective:  Physical Exam: Filed Vitals:   12/13/13 1550  BP: 187/79  Pulse: 60  Temp: 97.1 F (36.2 C)  TempSrc: Oral  Weight: 162 lb 14.4 oz (73.891 kg)  SpO2: 98%   General: pleasant, appear as stated age, no distress HEENT: no scleral icterus, no conjunctival pallor, left eye blind Cardiac: RRR, no rubs, murmurs or gallops Pulm: clear to auscultation bilaterally, moving normal volumes of air Abd: soft, nontender, nondistended, BS present Ext: warm and well perfused, 1+ pretibial edema Neuro: alert and oriented X3, cranial nerves II-XII grossly intact  Assessment & Plan:  Case and care discussed with Dr. Marinda Elk.  Please see problem oriented charting for further details. Patient to return in 1 month for blood pressure follow up.

## 2013-12-14 NOTE — Progress Notes (Signed)
Case discussed with Dr. Sharda soon after the resident saw the patient.  We reviewed the resident's history and exam and pertinent patient test results.  I agree with the assessment, diagnosis, and plan of care documented in the resident's note. 

## 2013-12-20 ENCOUNTER — Encounter: Payer: Self-pay | Admitting: Internal Medicine

## 2013-12-24 ENCOUNTER — Ambulatory Visit (INDEPENDENT_AMBULATORY_CARE_PROVIDER_SITE_OTHER): Payer: PRIVATE HEALTH INSURANCE | Admitting: Pharmacist

## 2013-12-24 DIAGNOSIS — Z7901 Long term (current) use of anticoagulants: Secondary | ICD-10-CM

## 2013-12-24 DIAGNOSIS — Z86718 Personal history of other venous thrombosis and embolism: Secondary | ICD-10-CM

## 2013-12-24 LAB — POCT INR: INR: 1.8

## 2013-12-24 NOTE — Progress Notes (Signed)
Anti-Coagulation Progress Note  Chelsea Mcdaniel is a 80 y.o. female who reports to the clinic for monitoring of anticoagulation treatment.    RECENT RESULTS: Recent results are below, the most recent result is correlated with a dose of 48.75 mg. per week (increased weekly dose by approx. 8%). Of note, patient reports increased dietary vitamin K intake and noncompliance. Education reinforced around consistent diet and the importance of medication adherence. Lab Results  Component Value Date   INR 1.8 12/24/2013   INR 3.60 12/03/2013   INR 3.30 11/12/2013    ANTI-COAG DOSE: Anticoagulation Dose Instructions as of 12/24/2013     [name] Tue Wed Thu Fri Sat   New Dose 7.5 mg 7.5 mg 7.5 mg 3.75 mg 7.5 mg 7.5 mg 7.5 mg       ANTICOAG SUMMARY: Anticoagulation Episode Summary   Current INR goal    Next INR check 01/07/2014   INR from last check 1.8 (12/24/2013)   Weekly max dose    Target end date    INR check location    Preferred lab    Send INR reminders to       Comments         ANTICOAG TODAY: Anticoagulation Summary as of 12/24/2013   INR goal    Selected INR 1.8 (12/24/2013)   Next INR check 01/07/2014   Target end date     Anticoagulation Episode Summary   INR check location    Preferred lab    Send INR reminders to    Comments       PATIENT INSTRUCTIONS: Patient Instructions  Patient instructed to take medications as defined in the Anti-coagulation Track section of this encounter.  Patient instructed to take today's dose.  Patient verbalized understanding of these instructions.       FOLLOW-UP Return in about 2 weeks (around 01/07/2014) for Follow up INR at 1530.  Flossie Dibble, PharmD BCPS, BCACP

## 2013-12-24 NOTE — Patient Instructions (Signed)
Patient instructed to take medications as defined in the Anti-coagulation Track section of this encounter.  Patient instructed to take today's dose.  Patient verbalized understanding of these instructions.    

## 2013-12-25 ENCOUNTER — Other Ambulatory Visit: Payer: Self-pay | Admitting: *Deleted

## 2013-12-25 MED ORDER — BENAZEPRIL HCL 40 MG PO TABS: 40.0000 mg | ORAL_TABLET | Freq: Every day | ORAL | Status: DC

## 2013-12-26 ENCOUNTER — Other Ambulatory Visit: Payer: Self-pay | Admitting: *Deleted

## 2013-12-26 DIAGNOSIS — I10 Essential (primary) hypertension: Secondary | ICD-10-CM

## 2013-12-27 MED ORDER — METOPROLOL SUCCINATE ER 25 MG PO TB24: 12.5000 mg | ORAL_TABLET | Freq: Every day | ORAL | Status: DC

## 2013-12-27 MED ORDER — METOPROLOL SUCCINATE 12.5 MG HALF TABLET
12.5000 mg | ORAL_TABLET | Freq: Every day | ORAL | Status: DC
Start: ? — End: 2013-12-27

## 2013-12-27 NOTE — Telephone Encounter (Signed)
Refill approved - nurse to call in. 

## 2013-12-27 NOTE — Telephone Encounter (Signed)
I corrected the metoprolol prescription - patient has been on metoprolol XL 25 mg one-half tablet daily for a daily dose of 12.5 mg.

## 2013-12-27 NOTE — Telephone Encounter (Signed)
Talked with pharmacy - discard Rx 12.5mg  #90 and instead use correct Rx 25mg  #45 per Dr Marinda Elk.

## 2013-12-31 ENCOUNTER — Ambulatory Visit: Payer: PRIVATE HEALTH INSURANCE

## 2014-01-07 ENCOUNTER — Encounter: Payer: Self-pay | Admitting: Internal Medicine

## 2014-01-07 ENCOUNTER — Ambulatory Visit: Payer: PRIVATE HEALTH INSURANCE

## 2014-01-07 ENCOUNTER — Ambulatory Visit (INDEPENDENT_AMBULATORY_CARE_PROVIDER_SITE_OTHER): Payer: PRIVATE HEALTH INSURANCE | Admitting: Internal Medicine

## 2014-01-07 VITALS — BP 188/83 | HR 66 | Temp 98.0°F | Ht 62.0 in | Wt 163.2 lb

## 2014-01-07 DIAGNOSIS — I129 Hypertensive chronic kidney disease with stage 1 through stage 4 chronic kidney disease, or unspecified chronic kidney disease: Secondary | ICD-10-CM

## 2014-01-07 DIAGNOSIS — I10 Essential (primary) hypertension: Secondary | ICD-10-CM

## 2014-01-07 DIAGNOSIS — E119 Type 2 diabetes mellitus without complications: Secondary | ICD-10-CM

## 2014-01-07 DIAGNOSIS — M62541 Muscle wasting and atrophy, not elsewhere classified, right hand: Secondary | ICD-10-CM

## 2014-01-07 DIAGNOSIS — G5600 Carpal tunnel syndrome, unspecified upper limb: Secondary | ICD-10-CM | POA: Insufficient documentation

## 2014-01-07 LAB — GLUCOSE, CAPILLARY: GLUCOSE-CAPILLARY: 242 mg/dL — AB (ref 70–99)

## 2014-01-07 NOTE — Progress Notes (Addendum)
Subjective:   Patient ID: Chelsea Mcdaniel female   DOB: 08-20-1927 80 y.o.   MRN: [id number]  HPI: Chelsea Mcdaniel is a 80 y.o. woman with PMH significant for HTN, DM, HLD, H/O CVA in [id number] s/p right CEA, CKD stage III, and H/O DVT X2 on indefinite anticoagulation comes to the office for a follow up on her BP.  Patient reports that she "forgot" to take all her medications today, including her BP medications.  Patient reports that she has been having problems using her right hand for the last 5-6 months. Symptoms started insidiously. First she noticed difficulty picking up the weights with her right hand. Then gradually she noticed having difficulty to write with her right hand and she states that she can "no longer" write and if she could write it is not readable. She also reports difficulty eating using fork and knife with her right hand. She states that she could no longer comb her hair using right hand. She reports that her symptoms have been progressively getting worse and that she is concerned about her symptoms. She denies any pain in her right wrist/elbow/shoulder/neck and denies any trauma. She denies any tingling/numbness in her hands and denies any weakness in right upper extremity.  She denies any history of carpal tunnel syndrome.   She denies any other symptoms.  Past Medical History  Diagnosis Date  . Diabetes mellitus   . Hyperlipidemia   . Hypertension   . CVA (cerebrovascular accident)  October 2007     CT of the head atrophy with multiple remote insults noted but no definite acute findings  . Blindness of left eye      likely related to stroke, left cataract removed from that eye with complications  . Anemia      normocytic anemia with baseline hemoglobin 10-11  . Chronic kidney disease 2006     left renal artery stenosis with probable hemodynamic significance, kidneys are normal in morphology without focal lesions or hydronephrosis this is based but cannot A. of the  abdomen with and without contrast done on the 31st 2006  . Glaucoma   . Personal history of DVT (deep vein thrombosis) 08/11/2010   Current Outpatient Prescriptions  Medication Sig Dispense Refill  . amLODipine (NORVASC) 10 MG tablet Take 1 tablet (10 mg total) by mouth daily. For blood pressure  90 tablet  1  . benazepril (LOTENSIN) 40 MG tablet Take 1 tablet (40 mg total) by mouth daily.  90 tablet  0  . Calcium-Vitamin D (CHEWABLE CALCIUM/D) 300 MG CHEW Chew by mouth. Take as directed       . colchicine 0.6 MG tablet Initial 1.2mg  (2 tabs) at first sign of gout flare, followed in 1 hour with 0.6mg   10 tablet  1  . diclofenac sodium (VOLTAREN) 1 % GEL Apply 1 application topically 2 (two) times daily. To knees  1 Tube  5  . furosemide (LASIX) 40 MG tablet Take 1 tablet (40 mg total) by mouth 2 (two) times daily. Take 40mg  (2 tablets) in the morning, and 20mg  (1 tablet) with supper.  90 tablet  11  . glipiZIDE (GLUCOTROL XL) 10 MG 24 hr tablet TAKE ONE TABLET BY MOUTH EVERY DAY  90 tablet  2  . latanoprost (XALATAN) 0.005 % ophthalmic solution       . LUMIGAN 0.01 % SOLN       . metoprolol succinate (TOPROL-XL) 25 MG 24 hr tablet Take 0.5 tablets (12.5 mg total) by mouth  daily.  45 tablet  0  . rosuvastatin (CRESTOR) 5 MG tablet TAKE ONE TABLET BY MOUTH AT BEDTIME  90 tablet  2  . sitaGLIPtin (JANUVIA) 50 MG tablet Take 1 tablet (50 mg total) by mouth daily.  90 tablet  4  . warfarin (COUMADIN) 7.5 MG tablet Take as directed by anticoagulation clinic provider. Take 1/2 tablet on Wednesdays; 1 tablet all other days.  30 tablet  2   No current facility-administered medications for this visit.   Family History  Problem Relation Age of Onset  . Heart disease Mother   . Diabetes Mother   . Hyperlipidemia Mother   . Hypertension Mother   . Heart disease Father   . Diabetes Father   . Hyperlipidemia Father   . Hypertension Father    History   Social History  . Marital Status: Widowed     Spouse Name: N/A    Number of Children: N/A  . Years of Education: N/A   Social History Main Topics  . Smoking status: Never Smoker   . Smokeless tobacco: None  . Alcohol Use: No  . Drug Use: No  . Sexual Activity: None   Other Topics Concern  . None   Social History Narrative    Patient is a widow. She has 11 children 5 of who are living. She is retired in 1993 from [employer]. She denies tobacco alcohol or drug use.   Review of Systems: Pertinent items are noted in HPI. Objective:  Physical Exam: Filed Vitals:   01/07/14 1415  BP: 188/83  Pulse: 66  Temp: 98 F (36.7 C)  TempSrc: Oral  Height: 5\' 2"  (1.575 m)  Weight: 163 lb 3.2 oz (74.027 kg)  SpO2: 98%   Constitutional: Vital signs reviewed.  Patient is a well-developed and well-nourished and is in no acute distress and cooperative with exam.  Alert and oriented x3.  Neck: Supple, normal ROM. No TTP noted over the C-spine. Cardiovascular: RRR, S1 normal, S2 normal, no MRG Pulmonary/Chest: normal respiratory effort, CTAB, no wheezes, rales, or rhonchi Extremities: No pedal edema.  Neurological: A&O x3, Strength is normal and symmetric bilaterally in all four extremities. Prominent and visible thenar atrophy noted in the right hand.  Compared to the left hand, the hypothenar eminence on the left hand is less prominent. The overall bulk of the forearm group of muscles appears to be symmetrical in both hands. Patient unable to attain full supination of the right forearm, unable to do flexion at the right wrist. Slightly decreased ROM with extension in right hand. The movement of the right thumb appear to be moderately affected. Phalen's test unable to asses because of inability to flex the right wrist. The Tinel's test on the right hand triggers slight pain along the median nerve distribution. Manual compression of the carpal tunnel on the right wrist was negative. Patient has difficulty picking up objects and taking it to  the mouth, mimicking eating. Patient has difficulty combing her hair. Patients has difficulty holding the pain and writing and is difficult to understand what she wrote.   After a written consent from the patient, two pictures of the patients hand demonstrating thenar atrophy, were taken with my mobile device in the presence of my nurse, Lela Sturdivant. Unfortnately, as I didn't have an existing haiku app on my phone, I could not transfer the images to EPIC. After consulting with Micheal Likens and Catarina Hartshorn of IT department (who stated that there is no other alternative  to transfer the images to [facility]), the images from the mobile were deleted.  Assessment & Plan:

## 2014-01-07 NOTE — Assessment & Plan Note (Addendum)
See my HPI and Physical examination for further details and examination. Unclear etiology. Possibilities etiologies include - Carpal tunnel syndrome (cannot explain inability to supinate), Brachial Plexopathy involving C8/T1 nerve roots, Cervical compression myelopathy involving lower cervical nerve roots vs Chronic compression of the medial nerve vs Pure LMN type motor neuron disease . Discussed with the attending regarding further management and plan.  Plans: EMG, NCS to further differentiate between the possibilities. Neurology referral to perform EMG and also for further recommendations and management. Follow up in 6 weeks.

## 2014-01-07 NOTE — Assessment & Plan Note (Signed)
Persistent Isolated Systolic HTN. SBP elevated, in the setting of non-compliance (patient did not take any of her BP meds today). Patient is on a multidrug regimen for HTN. Discussed with the attending regarding further management and plan.  Plans: Continue current regimen. Educated about medication compliance. Follow up in 6 weeks.

## 2014-01-07 NOTE — Assessment & Plan Note (Signed)
Well controlled.  Plans: Continue current regimen. 

## 2014-01-07 NOTE — Patient Instructions (Signed)
Take all the medications as recommended. 

## 2014-01-08 NOTE — Progress Notes (Signed)
Patient ID: Chelsea Mcdaniel, female   DOB: 08/18/1927, 80 y.o.   MRN: [id number]

## 2014-01-08 NOTE — Progress Notes (Signed)
Case discussed with Dr. Boggala at the time of the visit.  We reviewed the resident's history and exam and pertinent patient test results.  I agree with the assessment, diagnosis, and plan of care documented in the resident's note. 

## 2014-01-28 ENCOUNTER — Ambulatory Visit (INDEPENDENT_AMBULATORY_CARE_PROVIDER_SITE_OTHER): Payer: PRIVATE HEALTH INSURANCE | Admitting: Pharmacist

## 2014-01-28 DIAGNOSIS — Z7901 Long term (current) use of anticoagulants: Secondary | ICD-10-CM

## 2014-01-28 LAB — POCT INR: INR: 3

## 2014-01-28 NOTE — Progress Notes (Signed)
Anti-Coagulation Progress Note  Chelsea Mcdaniel is a 80 y.o. female who is currently on an anti-coagulation regimen.    RECENT RESULTS: Recent results are below, the most recent result is correlated with a dose of 48.75 mg. per week: Lab Results  Component Value Date   INR 3.00 01/28/2014   INR 1.8 12/24/2013   INR 3.60 12/03/2013    ANTI-COAG DOSE: Anticoagulation Dose Instructions as of 01/28/2014     Sun Mon Tue Wed Thu Fri Sat   New Dose 7.5 mg 7.5 mg 3.75 mg 7.5 mg 7.5 mg 3.75 mg 7.5 mg    Description       OMIT today's dose.        ANTICOAG SUMMARY: Anticoagulation Episode Summary   Current INR goal    Next INR check 02/11/2014   INR from last check 3.00 (01/28/2014)   Weekly max dose    Target end date    INR check location    Preferred lab    Send INR reminders to       Comments         ANTICOAG TODAY: Anticoagulation Summary as of 01/28/2014   INR goal    Selected INR 3.00 (01/28/2014)   Next INR check 02/11/2014   Target end date     Anticoagulation Episode Summary   INR check location    Preferred lab    Send INR reminders to    Comments       PATIENT INSTRUCTIONS: Patient Instructions  Patient instructed to take medications as defined in the Anti-coagulation Track section of this encounter.  Patient instructed to OMIT today's dose.  Patient verbalized understanding of these instructions.       FOLLOW-UP Return in 2 weeks (on 02/11/2014) for Follow up INR at 3:15PM.  [name], III Pharm.D., CACP

## 2014-01-28 NOTE — Patient Instructions (Signed)
Patient instructed to take medications as defined in the Anti-coagulation Track section of this encounter.  Patient instructed to OMIT today's dose.  Patient verbalized understanding of these instructions.    

## 2014-01-28 NOTE — Progress Notes (Signed)
INTERNAL MEDICINE TEACHING ATTENDING ADDENDUM - Maximillian Habibi M.D  Duration- indefinite, Indication- recurrent CVAs, DVT, INR- therapeutic. Agree with Dr. Gladstone Pih recommendations as outlined in his note.

## 2014-02-08 ENCOUNTER — Ambulatory Visit (INDEPENDENT_AMBULATORY_CARE_PROVIDER_SITE_OTHER): Payer: PRIVATE HEALTH INSURANCE | Admitting: Neurology

## 2014-02-08 ENCOUNTER — Telehealth: Payer: Self-pay | Admitting: Neurology

## 2014-02-08 ENCOUNTER — Encounter (INDEPENDENT_AMBULATORY_CARE_PROVIDER_SITE_OTHER): Payer: Self-pay

## 2014-02-08 ENCOUNTER — Encounter: Payer: Self-pay | Admitting: Neurology

## 2014-02-08 DIAGNOSIS — E119 Type 2 diabetes mellitus without complications: Secondary | ICD-10-CM

## 2014-02-08 DIAGNOSIS — R29898 Other symptoms and signs involving the musculoskeletal system: Secondary | ICD-10-CM | POA: Insufficient documentation

## 2014-02-08 DIAGNOSIS — I6350 Cerebral infarction due to unspecified occlusion or stenosis of unspecified cerebral artery: Secondary | ICD-10-CM

## 2014-02-08 NOTE — Telephone Encounter (Signed)
Spoke to patient and explained that she needs to have MRI first before nerve conduction and EMG test per DR. Krista Blue. Advised patient to call back once her MRI has been scheduled so we can get her scheduled for her NCV/EMG test.

## 2014-02-08 NOTE — Progress Notes (Signed)
PATIENT: Chelsea Mcdaniel DOB: 28-May-1928  HISTORICAL  Chelsea Mcdaniel  is a 80 y.o. Right handed woman referred by her primary care physician Boggala for evaluation of right arm weakness, right hand muscle atrophy  She PMH significant for HTN, DM, HLD, H/O CVA in 1985, involving her left side,1993 s/p right CEA, CKD stage III, and H/O DVT X2 on indefinite anticoagulation.  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 She has no significant numbness of her right hand.  She denies neck pain, no gait difficulty, no incontinence.   She denies right elbow, shoulder, wrist, hand pain, now she has left elbow, shoulder pain 1 weeks, she attributed it to gout,   She denies significant gait difficulty, no bowel bladder incontinence, no bilateral lower extremity paresthesia,  REVIEW OF SYSTEMS: Full 14 system review of systems performed and notable only for swelling, loss of left vision, constipation, joint pain, feeling cold,  ALLERGIES: No Known Allergies  HOME MEDICATIONS: Current Outpatient Prescriptions on File Prior to Visit  Medication Sig Dispense Refill  . amLODipine (NORVASC) 10 MG tablet Take 1 tablet (10 mg total) by mouth daily. For blood pressure  90 tablet  1  . benazepril (LOTENSIN) 40 MG tablet Take 1 tablet (40 mg total) by mouth daily.  90 tablet  0  . Calcium-Vitamin D (CHEWABLE CALCIUM/D) 300 MG CHEW Chew by mouth. Take as directed       . colchicine 0.6 MG tablet Initial 1.2mg  (2 tabs) at first sign of gout flare, followed in 1 hour with 0.6mg   10 tablet  1  . diclofenac sodium (VOLTAREN) 1 % GEL Apply 1 application topically 2 (two) times daily. To knees  1 Tube  5  . furosemide (LASIX) 40 MG tablet Take 1 tablet (40 mg total) by mouth 2 (two) times  daily. Take 40mg  (2 tablets) in the morning, and 20mg  (1 tablet) with supper.  90 tablet  11  . glipiZIDE (GLUCOTROL XL) 10 MG 24 hr tablet TAKE ONE TABLET BY MOUTH EVERY DAY  90 tablet  2  . latanoprost (XALATAN) 0.005 % ophthalmic solution       . LUMIGAN 0.01 % SOLN       . metoprolol succinate (TOPROL-XL) 25 MG 24 hr tablet Take 0.5 tablets (12.5 mg total) by mouth daily.  45 tablet  0  . rosuvastatin (CRESTOR) 5 MG tablet TAKE ONE TABLET BY MOUTH AT BEDTIME  90 tablet  2  . sitaGLIPtin (JANUVIA) 50 MG tablet Take 1 tablet (50 mg total) by mouth daily.  90 tablet  4  . warfarin (COUMADIN) 7.5 MG tablet Take as directed by anticoagulation clinic provider. Take 1/2 tablet on Wednesdays; 1 tablet all other days.  30 tablet  2   No current facility-administered medications on file prior to visit.    PAST MEDICAL HISTORY: Past Medical History  Diagnosis Date  . Diabetes mellitus   . Hyperlipidemia   . Hypertension   . CVA (cerebrovascular accident)  October 2007     CT of the head atrophy with multiple remote insults noted but no definite acute findings  . Blindness of left eye      likely related to stroke, left cataract removed from that  eye with complications  . Anemia      normocytic anemia with baseline hemoglobin 10-11  . Chronic kidney disease 2006     left renal artery stenosis with probable hemodynamic significance, kidneys are normal in morphology without focal lesions or hydronephrosis this is based but cannot A. of the abdomen with and without contrast done on the 31st 2006  . Glaucoma   . Personal history of DVT (deep vein thrombosis) 08/11/2010    PAST SURGICAL HISTORY: Past Surgical History  Procedure Laterality Date  . Transthoracic echocardiogram   May 2008     left ventricular systolic function normal EF estimated range of 55-60%, no definite diagnostic evidence of left ventricular regional wall motion abnormalities, Doppler parameters consistent with abnormal left  ventricular relaxation, findings suggestive of possible bicuspid aortic valve and aortic valve thickness mildly increase    FAMILY HISTORY: Family History  Problem Relation Age of Onset  . Heart disease Mother   . Diabetes Mother   . Hyperlipidemia Mother   . Hypertension Mother   . Heart disease Father   . Diabetes Father   . Hyperlipidemia Father   . Hypertension Father     SOCIAL HISTORY:  History   Social History  . Marital Status: Widowed    Spouse Name: N/A    Number of Children: N/A  . Years of Education: N/A   Occupational History  . Not on file.   Social History Main Topics  . Smoking status: Never Smoker   . Smokeless tobacco: Not on file  . Alcohol Use: No  . Drug Use: No  . Sexual Activity: Not on file   Other Topics Concern  . Not on file   Social History Narrative    Patient is a widow. She has 11 children 5 of who are living. She is retired in 1993 from [employer]. She denies tobacco alcohol or drug use.     PHYSICAL EXAM   Filed Vitals:    Not recorded    Cannot calculate BMI with a height equal to zero.   Generalized: In no acute distress  Neck: Supple, no carotid bruits   Cardiac: Regular rate rhythm  Pulmonary: Clear to auscultation bilaterally  Musculoskeletal: No deformity  Neurological examination  Mentation: Alert oriented to time, place, history taking, and causual conversation  Cranial nerve II-XII: Right Pupil was round round reactive to light, able to read moderate print. Left pupil was irregular, left exotropia, no light perception. Visual field were full on confrontational test. Bilateral fundi were sharp.  Facial sensation and strength were normal. Hearing was intact to finger rubbing bilaterally. Uvula tongue midline.  Head turning and shoulder shrug and were normal and symmetric.Tongue protrusion into cheek strength was normal.  Motor: limited exam on left ar due to left shoulder elbow pain, no significant  weakness noticed, right elbow flexion 4+, shoulder abduction 4, external rotation4, elbow extension 4+ wrist flexion and extension 5 grip 5-, ,   Sensory: Intact to fine touch, pinprick, preserved vibratory sensation, and proprioception at toes.  Coordination: Normal finger to nose, heel-to-shin bilaterally there was no truncal ataxia  Gait: Rising up from seated position without assistance, normal stance,  mild Mcdaniel gait,  smooth turning, able to perform tiptoe, and heel walking Romberg signs: Negative  Deep tendon reflexes: Brachioradialis 2/2, biceps 2/2, triceps 2/2, patellar 2/2, Achilles 2/2, plantar responses were flexor bilaterally.   DIAGNOSTIC DATA (LABS, IMAGING, TESTING) - I reviewed patient records, labs, notes, testing and imaging myself  where available.  Lab Results  Component Value Date   WBC 5.5 12/13/2013   HGB 10.8* 12/13/2013   HCT 33.4* 12/13/2013   MCV 90.5 12/13/2013   PLT 272 12/13/2013      Component Value Date/Time   NA 137 12/13/2013 1635   K 4.6 12/13/2013 1635   CL 102 12/13/2013 1635   CO2 26 12/13/2013 1635   GLUCOSE 151* 12/13/2013 1635   BUN 34* 12/13/2013 1635   CREATININE 1.40* 12/13/2013 1635   CREATININE 1.99* 04/16/2010 1724   CALCIUM 9.4 12/13/2013 1635   PROT 7.0 12/13/2013 1635   ALBUMIN 3.8 12/13/2013 1635   AST 16 12/13/2013 1635   ALT 8 12/13/2013 1635   ALKPHOS 87 12/13/2013 1635   BILITOT 0.5 12/13/2013 1635   GFRNONAA 30* 12/10/2010 1117   GFRAA 36* 12/10/2010 1117   Lab Results  Component Value Date   CHOL 135 10/04/2012   HDL 44 10/04/2012   LDLCALC 69 10/04/2012   TRIG 109 10/04/2012   CHOLHDL 3.1 10/04/2012   Lab Results  Component Value Date   HGBA1C 6.9 12/13/2013   Lab Results  Component Value Date   VITAMINB12 530 07/09/2009   Lab Results  Component Value Date   TSH 2.476 04/26/2007    ASSESSMENT AND PLAN  Chelsea Mcdaniel is a 80 y.o. female complains of Left shoulder, left elbow pain, six-month history of gradual worsening  right arm weakness, on today's examination, no sensory loss found, weakness was mainly in the right proximal arm muscles, brief survey of right first dorsal interossei, pronator teres, deltoid, biceps, showed no significant active denervations, mild chronic neuropathic changes.  Ddx, right cervical radiculopathy, left hemisphere pathology,  EMG nerve conduction study   MRI cervical spine   return to clinic in 3-4 weeks   Marcial Pacas, M.D. Ph.D.  [facility] [address] [place], [place] [address] [phone]

## 2014-02-11 ENCOUNTER — Ambulatory Visit (INDEPENDENT_AMBULATORY_CARE_PROVIDER_SITE_OTHER): Payer: PRIVATE HEALTH INSURANCE | Admitting: Pharmacist

## 2014-02-11 DIAGNOSIS — Z5181 Encounter for therapeutic drug level monitoring: Secondary | ICD-10-CM

## 2014-02-11 DIAGNOSIS — Z7902 Long term (current) use of antithrombotics/antiplatelets: Secondary | ICD-10-CM

## 2014-02-11 LAB — POCT INR: INR: 3

## 2014-02-11 NOTE — Progress Notes (Signed)
Anti-Coagulation Progress Note  Chelsea Mcdaniel is a 80 y.o. female who is currently on an anti-coagulation regimen.    RECENT RESULTS: Recent results are below, the most recent result is correlated with a dose of 45 mg. per week: Lab Results  Component Value Date   INR 3.00 02/11/2014   INR 3.00 01/28/2014   INR 1.8 12/24/2013    ANTI-COAG DOSE: Anticoagulation Dose Instructions as of 02/11/2014     [name] Tue Wed Thu Fri Sat   New Dose 7.5 mg 3.75 mg 3.75 mg 7.5 mg 7.5 mg 3.75 mg 7.5 mg       ANTICOAG SUMMARY: Anticoagulation Episode Summary   Current INR goal    Next INR check 03/04/2014   INR from last check 3.00 (02/11/2014)   Weekly max dose    Target end date    INR check location    Preferred lab    Send INR reminders to       Comments         ANTICOAG TODAY: Anticoagulation Summary as of 02/11/2014   INR goal    Selected INR 3.00 (02/11/2014)   Next INR check 03/04/2014   Target end date     Anticoagulation Episode Summary   INR check location    Preferred lab    Send INR reminders to    Comments       PATIENT INSTRUCTIONS: Patient Instructions  Patient instructed to take medications as defined in the Anti-coagulation Track section of this encounter.  Patient instructed to OMIT today's dose.  Patient verbalized understanding of these instructions.       FOLLOW-UP Return in about 3 weeks (around 03/04/2014) for Follow up INR at 3:45PM.  [name], III Pharm.D., CACP

## 2014-02-11 NOTE — Patient Instructions (Signed)
Patient instructed to take medications as defined in the Anti-coagulation Track section of this encounter.  Patient instructed to OMIT today's dose.  Patient verbalized understanding of these instructions.    

## 2014-02-12 ENCOUNTER — Other Ambulatory Visit: Payer: Self-pay | Admitting: *Deleted

## 2014-02-12 DIAGNOSIS — E119 Type 2 diabetes mellitus without complications: Secondary | ICD-10-CM

## 2014-02-12 MED ORDER — GLUCOSE BLOOD VI STRP: ORAL_STRIP | Status: DC

## 2014-02-12 MED ORDER — ACCU-CHEK SOFT TOUCH LANCETS MISC: Status: DC

## 2014-02-13 ENCOUNTER — Telehealth: Payer: Self-pay | Admitting: *Deleted

## 2014-02-13 NOTE — Telephone Encounter (Signed)
Dr Redmond Pulling, pt has an old contour meter, [place] aide states sometimes it does not work well and someone called and said pt could get a new one, she would like a new one but wants it sent to her pharmacy, could we go ahead and order a new meter?

## 2014-02-14 ENCOUNTER — Other Ambulatory Visit: Payer: Self-pay | Admitting: *Deleted

## 2014-02-14 DIAGNOSIS — I10 Essential (primary) hypertension: Secondary | ICD-10-CM

## 2014-02-14 NOTE — Telephone Encounter (Signed)
Refill Rx from [facility] states Lasix 40 mg one tab daily.  Pt called and she is taking Lasix 40 mg 1 in AM and 2 in PM ( she prefers to take the 2 at night )

## 2014-02-15 ENCOUNTER — Other Ambulatory Visit: Payer: Self-pay | Admitting: Internal Medicine

## 2014-02-15 MED ORDER — ACCU-CHEK AVIVA DEVI: Status: AC

## 2014-02-15 NOTE — Telephone Encounter (Signed)
Accu-Chek Aviva meter ordered.

## 2014-02-19 ENCOUNTER — Encounter: Payer: PRIVATE HEALTH INSURANCE | Admitting: Neurology

## 2014-02-19 ENCOUNTER — Encounter: Payer: PRIVATE HEALTH INSURANCE | Admitting: Radiology

## 2014-02-19 ENCOUNTER — Ambulatory Visit
Admission: RE | Admit: 2014-02-19 | Discharge: 2014-02-19 | Disposition: A | Discharge status: Home / Self Care | Payer: PRIVATE HEALTH INSURANCE | Source: Ambulatory Visit | Attending: Neurology | Admitting: Neurology

## 2014-02-19 DIAGNOSIS — I6350 Cerebral infarction due to unspecified occlusion or stenosis of unspecified cerebral artery: Secondary | ICD-10-CM

## 2014-02-19 DIAGNOSIS — R29898 Other symptoms and signs involving the musculoskeletal system: Secondary | ICD-10-CM

## 2014-02-19 DIAGNOSIS — E119 Type 2 diabetes mellitus without complications: Secondary | ICD-10-CM

## 2014-02-19 NOTE — Telephone Encounter (Signed)
This refill will be resent with corrected directions.

## 2014-02-20 ENCOUNTER — Other Ambulatory Visit: Payer: Self-pay | Admitting: *Deleted

## 2014-02-20 DIAGNOSIS — I10 Essential (primary) hypertension: Secondary | ICD-10-CM

## 2014-02-20 MED ORDER — FUROSEMIDE 40 MG PO TABS: ORAL_TABLET | ORAL | Status: DC

## 2014-02-20 MED ORDER — GLIPIZIDE ER 10 MG PO TB24: ORAL_TABLET | ORAL | Status: DC

## 2014-02-20 NOTE — Telephone Encounter (Signed)
Please correct the sig.

## 2014-02-21 ENCOUNTER — Telehealth: Payer: Self-pay | Admitting: Neurology

## 2014-02-21 DIAGNOSIS — R29898 Other symptoms and signs involving the musculoskeletal system: Secondary | ICD-10-CM

## 2014-02-21 DIAGNOSIS — M62541 Muscle wasting and atrophy, not elsewhere classified, right hand: Secondary | ICD-10-CM

## 2014-02-21 NOTE — Telephone Encounter (Signed)
I fail to reach patient, could not Leave message,   Hinton Dyer, Please give her an appt for EMG/NCS, also tell her abnormal MRI cervical, will go over result at her follow up visit.    At C3-4: disc bulging and facet hypertrophy with moderate spinal stenosis and severe biforaminal stenosis; hazy T2 hyperintensity at C3 and C4 levels may indicate edema or gliosis  2. At C5-6: disc bulging and facet hypertrophy with mild spinal stenosis and severe biforaminal stenosis  3. At C4-5: disc bulging and facet hypertrophy with mild spinal stenosis and moderate biforaminal stenosis  4. At C6-7: uncovertebral joint hypertrophy and facet hypertrophy with moderate biforaminal stenosis  5. At C2-3: facet hypertrophy and uncovertebral joint hypertrophy with severe right foraminal stenosis  6. Multi-level degenerative changes including: Degenerative pannus formation posterior to the dens, without contact upon the spinal cord. 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 Degenerative spondylosis and disc bulging from C2-3 to C5-6. Reversal of normal cervical curvature. Degenerative endplate disease with marrow edema at C5-6 anteriorly.

## 2014-02-22 NOTE — Telephone Encounter (Signed)
Patient is sch for her ncv/ emg Ivin Booty has contacted and scheduled.

## 2014-02-22 NOTE — Addendum Note (Signed)
Addended by: Truddie Crumble on: 02/22/2014 04:55 PM   Modules accepted: Orders

## 2014-02-27 ENCOUNTER — Ambulatory Visit (INDEPENDENT_AMBULATORY_CARE_PROVIDER_SITE_OTHER): Payer: PRIVATE HEALTH INSURANCE | Admitting: Internal Medicine

## 2014-02-27 ENCOUNTER — Other Ambulatory Visit: Payer: Self-pay | Admitting: *Deleted

## 2014-02-27 ENCOUNTER — Encounter: Payer: Self-pay | Admitting: Internal Medicine

## 2014-02-27 VITALS — BP 135/67 | HR 65 | Temp 97.2°F | Ht 62.0 in | Wt 163.1 lb

## 2014-02-27 DIAGNOSIS — M62541 Muscle wasting and atrophy, not elsewhere classified, right hand: Secondary | ICD-10-CM

## 2014-02-27 DIAGNOSIS — M25569 Pain in unspecified knee: Secondary | ICD-10-CM

## 2014-02-27 DIAGNOSIS — Z0000 Encounter for general adult medical examination without abnormal findings: Secondary | ICD-10-CM

## 2014-02-27 DIAGNOSIS — E119 Type 2 diabetes mellitus without complications: Secondary | ICD-10-CM

## 2014-02-27 DIAGNOSIS — M19049 Primary osteoarthritis, unspecified hand: Secondary | ICD-10-CM

## 2014-02-27 DIAGNOSIS — I10 Essential (primary) hypertension: Secondary | ICD-10-CM

## 2014-02-27 DIAGNOSIS — M109 Gout, unspecified: Secondary | ICD-10-CM

## 2014-02-27 DIAGNOSIS — G5600 Carpal tunnel syndrome, unspecified upper limb: Secondary | ICD-10-CM

## 2014-02-27 LAB — GLUCOSE, CAPILLARY: GLUCOSE-CAPILLARY: 200 mg/dL — AB (ref 70–99)

## 2014-02-27 LAB — POCT GLYCOSYLATED HEMOGLOBIN (HGB A1C): Hemoglobin A1C: 6.8

## 2014-02-27 MED ORDER — COLCHICINE 0.6 MG PO TABS: ORAL_TABLET | ORAL | Status: DC

## 2014-02-27 NOTE — Assessment & Plan Note (Signed)
Right hand weakness but no hand pain.  No joint swelling appreciable in the hands.  3rd digit PIP of the right hand is minimally TTP.

## 2014-02-27 NOTE — Assessment & Plan Note (Signed)
BP Readings from Last 3 Encounters:  02/27/14 135/67  01/07/14 188/83  12/13/13 167/74    Lab Results  Component Value Date   NA 137 12/13/2013   K 4.6 12/13/2013   CREATININE 1.40* 12/13/2013    Assessment: Blood pressure control: mildly elevated Progress toward BP goal:   unchanged.   Comments: Initial BP 173/26mmHg, repeat 135/45mmHg.  She has not taken her BP medications yet.  She reports compliance but seems to take them later in the day.  She cannot name them and has left the medications at home but she knows there is one pill that she only takes half of (Toprol XL).  Furthermore, she has a nurse who comes to her home to check BP and assist with medications.  Her medications are delivered from the pharmacy in blister packs.  I will not increase medications as she reports compliance and repeat BP improved.  Goal BP in her age range is < 150/90 and I would not want to risk her dropping too low.  Plan: Medications:  continue current medications:  Toprol XL 12.5mg  daily, Lasix 20mg  in AM and 40mg  in PM, Lotensin 40mg  daily, Norvasc 10mg  daily Educational resources provided: brochure Other plans: RTC in 3 months for BP check

## 2014-02-27 NOTE — Assessment & Plan Note (Signed)
Lab Results  Component Value Date   HGBA1C 6.8 02/27/2014   HGBA1C 6.9 12/13/2013   HGBA1C 8.1 08/15/2013     Assessment: Diabetes control: good control (HgbA1C at goal) Progress toward A1C goal:  at goal Comments:  Reports compliance with meds.  Did not bring her meter but says she checks daily and fasting values are usually around 160.  She denies lows (below 80) or symptoms of hypoglycemia or hyperglycemia.   A nurse also visits her home to assist with this.  Plan: Medications:  continue current medications:  Januvia 50mg  daily and glipizide 10mg  daily Home glucose monitoring: Frequency: 2 times a day Timing:   Instruction/counseling given: reminded to bring blood glucose meter & log to each visit and reminded to bring medications to each visit Educational resources provided: brochure Other plans: RTC in 3 months for follow-up and HgbA1c check

## 2014-02-27 NOTE — Assessment & Plan Note (Addendum)
No knee pain currently.  Knees are non-tender to palpation with full ROM.

## 2014-02-27 NOTE — Assessment & Plan Note (Addendum)
She has been evaluated by neuro, Dr. Krista Blue who ordered MRI followed by EMG.  MRI is abnormal and reveals multilevel degenerative changes which may be contributing to the patients complaints.  Negative tinel's, no hand pain making CTS unlikely.  - EMG/NCS and follow-up with neuro is scheduled for 03/21/14 - will await neuro recommendations after MRI

## 2014-02-27 NOTE — Assessment & Plan Note (Signed)
Patient has declined vaccines in the past.  Today I offered Pneumococcal and Shingles and she declines due to fear of shots.  I will continue to address at future visits.

## 2014-02-27 NOTE — Patient Instructions (Signed)
1. Please follow-up with the neurologist on 03/21/2014.   2. Please take all medications as prescribed.     3. If you have worsening of your symptoms or new symptoms arise, please call the clinic [phone] or go to the ER immediately if symptoms are severe.   Return to see me in 3 months or sooner if you need to.  Please try to bring all your medicines next time. This helps [place] take good care of you and stops mistakes from medicines that could hurt you.

## 2014-02-27 NOTE — Assessment & Plan Note (Addendum)
Patient w/o gout symptoms at present but has flares occasionally. Last flare was a month ago.  She would like refill of Colcrys.  Recent creatinine better than baseline so I am comfortable prescribing a small amount.  She has been advised to only use during a flare and not long-term.

## 2014-02-27 NOTE — Progress Notes (Signed)
   Subjective:    Patient ID: Chelsea Mcdaniel, female    DOB: [birth year], 80 y.o.   MRN: [id number]  HPI Comments: Chelsea Mcdaniel is an 80 year old with PMH of HTN, CVA, DM (HgbA1c 6.01 Dec 2013), OA, CKD3, anemia, gout and hx of DVT (on chronic coumadin) here for routine check-up and c/o loss of grip strength in right hand 5-6 months and soreness in left biceps x 1 month.  No preceding trauma.  No joint pain or swelling (except when she gets gout, last attack was last month).  She has been seen for this complaint before and has been evaluated by Neuro recently.  She had a cervical MRI c/w multilevel degenerative disease w/ disc bulging and foraminal stenosis.  She will follow-up with neuro for EMG in three weeks.  Walking around on her own at home w/o problem.  Uses cane for long distance.  No recent falls.   Lives alone but children, grandkids and other family in [place].  Please see problem oriented charting for update of chronic medical conditions.         Review of Systems  Constitutional: Negative for fever, chills and appetite change.  Respiratory: Negative for shortness of breath.   Cardiovascular: Positive for leg swelling. Negative for chest pain and palpitations.       Occasional, responds to elevation  Gastrointestinal: Positive for constipation. Negative for nausea, vomiting, abdominal pain, diarrhea and blood in stool.       Occasional   Genitourinary: Negative for dysuria, frequency and hematuria.  Musculoskeletal: Positive for myalgias. Negative for arthralgias, back pain, joint swelling and neck pain.       Left upper arm.  Neurological: Negative for dizziness, syncope and light-headedness.  Hematological: Does not bruise/bleed easily.  Psychiatric/Behavioral: Negative for sleep disturbance and dysphoric mood.       Objective:   Physical Exam  Vitals reviewed. Constitutional: She is oriented to person, place, and time. She appears well-developed. No distress.  HENT:  Head:  Normocephalic and atraumatic.  Mouth/Throat: Oropharynx is clear and moist. No oropharyngeal exudate.  Eyes: EOM are normal.  Right eye PERRL; non-reactive left pupil noted on prior exams.  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  Neck: Normal range of motion. Neck supple.  Cardiovascular: Normal rate, regular rhythm and normal heart sounds.  Exam reveals no gallop and no friction rub.   No murmur heard. Pulmonary/Chest: Effort normal and breath sounds normal. No respiratory distress. She has no wheezes. She has no rales.  Abdominal: Soft. Bowel sounds are normal. She exhibits no distension. There is no tenderness.  Musculoskeletal: Normal range of motion. She exhibits no edema and no tenderness.  Upper arm strength is intact.  She has full ROM.  Slightly TTP just above left antecubital fossa; no bruising, no bicep atrophy.  Neurological: She is alert and oriented to person, place, and time. No cranial nerve deficit. She exhibits normal muscle tone.  Slightly decreased left hand grip; otherwise strength and sensation equal and intact upper and lower extremities.  She does exhibit thenar atropy (B/L); negative Tinel's.  Skin: Skin is warm. She is not diaphoretic.  Psychiatric: She has a normal mood and affect. Her behavior is normal.          Assessment & Plan:  Please see problem based assessment and plan.

## 2014-02-28 ENCOUNTER — Other Ambulatory Visit: Payer: Self-pay | Admitting: Internal Medicine

## 2014-02-28 DIAGNOSIS — E785 Hyperlipidemia, unspecified: Secondary | ICD-10-CM

## 2014-02-28 DIAGNOSIS — M791 Myalgia, unspecified site: Secondary | ICD-10-CM

## 2014-02-28 NOTE — Progress Notes (Signed)
Attending physician note: Presenting problems, physical findings, medications, reviewed with resident physician Dr. Duwaine Maxin on the day of the patient's visit and I concur with her management plan. Murriel Hopper, M.D., [place]

## 2014-03-01 ENCOUNTER — Ambulatory Visit: Payer: PRIVATE HEALTH INSURANCE | Admitting: Neurology

## 2014-03-04 ENCOUNTER — Ambulatory Visit (INDEPENDENT_AMBULATORY_CARE_PROVIDER_SITE_OTHER): Payer: PRIVATE HEALTH INSURANCE | Admitting: Pharmacist

## 2014-03-04 DIAGNOSIS — I639 Cerebral infarction, unspecified: Secondary | ICD-10-CM

## 2014-03-04 DIAGNOSIS — I6350 Cerebral infarction due to unspecified occlusion or stenosis of unspecified cerebral artery: Secondary | ICD-10-CM

## 2014-03-04 DIAGNOSIS — Z7901 Long term (current) use of anticoagulants: Secondary | ICD-10-CM

## 2014-03-04 LAB — POCT INR: INR: 3

## 2014-03-04 NOTE — Progress Notes (Signed)
Anti-Coagulation Progress Note  Chelsea Mcdaniel is a 80 y.o. female who is currently on an anti-coagulation regimen.    RECENT RESULTS: Recent results are below, the most recent result is correlated with a dose of 41.25 mg. per week: Lab Results  Component Value Date   INR 3.00 03/04/2014   INR 3.00 02/11/2014   INR 3.00 01/28/2014    ANTI-COAG DOSE: Anticoagulation Dose Instructions as of 03/04/2014     [name] Tue Wed Thu Fri Sat   New Dose 7.5 mg 3.75 mg 7.5 mg 3.75 mg 7.5 mg 3.75 mg 3.75 mg       ANTICOAG SUMMARY: Anticoagulation Episode Summary   Current INR goal    Next INR check 03/25/2014   INR from last check 3.00 (03/04/2014)   Weekly max dose    Target end date    INR check location    Preferred lab    Send INR reminders to       Comments         ANTICOAG TODAY: Anticoagulation Summary as of 03/04/2014   INR goal    Selected INR 3.00 (03/04/2014)   Next INR check 03/25/2014   Target end date     Anticoagulation Episode Summary   INR check location    Preferred lab    Send INR reminders to    Comments       PATIENT INSTRUCTIONS: Patient Instructions  Patient instructed to take medications as defined in the Anti-coagulation Track section of this encounter.  Patient instructed to take today's dose.  Patient verbalized understanding of these instructions.       FOLLOW-UP Return in 3 weeks (on 03/25/2014) for Follow up INR at 4:30pm.  [name], III Pharm.D., CACP

## 2014-03-04 NOTE — Patient Instructions (Signed)
Patient instructed to take medications as defined in the Anti-coagulation Track section of this encounter.  Patient instructed to take today's dose.  Patient verbalized understanding of these instructions.    

## 2014-03-05 MED ORDER — ROSUVASTATIN CALCIUM 5 MG PO TABS: ORAL_TABLET | ORAL | Status: DC

## 2014-03-20 ENCOUNTER — Other Ambulatory Visit: Payer: Self-pay | Admitting: Internal Medicine

## 2014-03-21 ENCOUNTER — Encounter (INDEPENDENT_AMBULATORY_CARE_PROVIDER_SITE_OTHER): Payer: Self-pay

## 2014-03-21 ENCOUNTER — Ambulatory Visit (INDEPENDENT_AMBULATORY_CARE_PROVIDER_SITE_OTHER): Payer: PRIVATE HEALTH INSURANCE | Admitting: Neurology

## 2014-03-21 DIAGNOSIS — I6350 Cerebral infarction due to unspecified occlusion or stenosis of unspecified cerebral artery: Secondary | ICD-10-CM

## 2014-03-21 DIAGNOSIS — M62541 Muscle wasting and atrophy, not elsewhere classified, right hand: Secondary | ICD-10-CM

## 2014-03-21 DIAGNOSIS — Z0289 Encounter for other administrative examinations: Secondary | ICD-10-CM

## 2014-03-21 DIAGNOSIS — M545 Low back pain, unspecified: Secondary | ICD-10-CM

## 2014-03-21 DIAGNOSIS — G5600 Carpal tunnel syndrome, unspecified upper limb: Secondary | ICD-10-CM

## 2014-03-21 DIAGNOSIS — E119 Type 2 diabetes mellitus without complications: Secondary | ICD-10-CM

## 2014-03-21 DIAGNOSIS — R29898 Other symptoms and signs involving the musculoskeletal system: Secondary | ICD-10-CM

## 2014-03-21 NOTE — Progress Notes (Signed)
PATIENT: Chelsea Mcdaniel DOB: 07/12/1928  HISTORICAL  Chelsea Mcdaniel  is a 80 y.o. Right handed woman referred by her primary care physician Chelsea Mcdaniel for evaluation of right arm weakness, right hand muscle atrophy  She PMH significant for HTN, DM, HLD, H/O CVA in 1985, involving her left side,1993 s/p right CEA, CKD stage III, and H/O DVT X2 on indefinite anticoagulation.  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 She has no significant numbness of her right hand.  She denies neck pain, no gait difficulty, no incontinence.   She denies right elbow, shoulder, wrist, hand pain, now she has left elbow, shoulder pain 1 weeks, she attributed it to gout,   She denies significant gait difficulty, no bowel bladder incontinence, no bilateral lower extremity paresthesia,  Update March 21 2014  Patient return for electrodiagnostic study today, which has demonstrates severe bilateral carpal tunnel syndromes, also chronic right cervical radiculopathy  I also reviewed MRI cervical spine with patient and her family 1. At C3-4: disc bulging and facet hypertrophy with moderate spinal stenosis and severe biforaminal stenosis; hazy T2 hyperintensity at C3 and C4 levels may indicate edema or gliosis  2. At C5-6: disc bulging and facet hypertrophy with mild spinal stenosis and severe biforaminal stenosis  3. At C4-5: disc bulging and facet hypertrophy with mild spinal stenosis and moderate biforaminal stenosis  4. At C6-7: uncovertebral joint hypertrophy and facet hypertrophy with moderate biforaminal stenosis  5. At C2-3: facet hypertrophy and uncovertebral joint hypertrophy with severe right foraminal stenosis  6. Multi-level degenerative changes including: Degenerative pannus  formation posterior to the dens, without contact upon the spinal cord. 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 Degenerative spondylosis and disc bulging from C2-3 to C5-6. Reversal of normal cervical curvature. Degenerative endplate disease with marrow edema at C5-6 anteriorly.    She denies significant neck pain, complains of chronic low back pain, mild worsening gait difficulty, occasionally bladder incontinence, also complains of low back pain,  REVIEW OF SYSTEMS: Full 14 system review of systems performed and notable only for swelling, loss of left vision, constipation, joint pain, feeling cold,  ALLERGIES: No Known Allergies  HOME MEDICATIONS: Current Outpatient Prescriptions on File Prior to Visit  Medication Sig Dispense Refill  . amLODipine (NORVASC) 10 MG tablet Take 1 tablet (10 mg total) by mouth daily. For blood pressure  90 tablet  1  . benazepril (LOTENSIN) 40 MG tablet Take 1 tablet (40 mg total) by mouth daily.  90 tablet  0  . Blood Glucose Monitoring Suppl (ACCU-CHEK AVIVA) device Use as instructed  1 each  0  . Calcium-Vitamin D (CHEWABLE CALCIUM/D) 300 MG CHEW Chew by mouth. Take as directed       . colchicine 0.6 MG tablet Initial 1.2mg  (2 tabs) at first sign of gout flare, followed in 1 hour with 0.6mg   10 tablet  1  . diclofenac sodium (VOLTAREN) 1 % GEL Apply 1 application topically 2 (two) times daily. To knees  1 Tube  5  . furosemide (LASIX) 40 MG tablet Take 40mg  in the morning and 20mg  with supper.  60 tablet  0  . glipiZIDE (GLUCOTROL XL) 10 MG 24 hr tablet TAKE ONE TABLET BY  MOUTH EVERY DAY  90 tablet  0  . glucose blood (ACCU-CHEK AVIVA PLUS) test strip Use 1 time daily to check blood sugar. diag code 250.00. Non-insulin dependent  100 each  3  . Lancets (ACCU-CHEK SOFT TOUCH) lancets Use 1 time daily to check blood sugar. diag code 250.00.non-insulin dependent  100 each  3  . latanoprost (XALATAN) 0.005 % ophthalmic solution       .  LUMIGAN 0.01 % SOLN       . metoprolol succinate (TOPROL-XL) 25 MG 24 hr tablet Take 0.5 tablets (12.5 mg total) by mouth daily.  45 tablet  0  . rosuvastatin (CRESTOR) 5 MG tablet TAKE ONE TABLET BY MOUTH AT BEDTIME  90 tablet  2  . sitaGLIPtin (JANUVIA) 50 MG tablet Take 1 tablet (50 mg total) by mouth daily.  90 tablet  4  . warfarin (COUMADIN) 7.5 MG tablet Take as directed by anticoagulation clinic provider. Take 1/2 tablet on Wednesdays; 1 tablet all other days.  30 tablet  2   No current facility-administered medications on file prior to visit.    PAST MEDICAL HISTORY: Past Medical History  Diagnosis Date  . Diabetes mellitus   . Hyperlipidemia   . Hypertension   . CVA (cerebrovascular accident)  October 2007     CT of the head atrophy with multiple remote insults noted but no definite acute findings  . Blindness of left eye      likely related to stroke, left cataract removed from that eye with complications  . Anemia      normocytic anemia with baseline hemoglobin 10-11  . Chronic kidney disease 2006     left renal artery stenosis with probable hemodynamic significance, kidneys are normal in morphology without focal lesions or hydronephrosis this is based but cannot A. of the abdomen with and without contrast done on the 31st 2006  . Glaucoma   . Personal history of DVT (deep vein thrombosis) 08/11/2010    PAST SURGICAL HISTORY: Past Surgical History  Procedure Laterality Date  . Transthoracic echocardiogram   May 2008     left ventricular systolic function normal EF estimated range of 55-60%, no definite diagnostic evidence of left ventricular regional wall motion abnormalities, Doppler parameters consistent with abnormal left ventricular relaxation, findings suggestive of possible bicuspid aortic valve and aortic valve thickness mildly increase    FAMILY HISTORY: Family History  Problem Relation Age of Onset  . Heart disease Mother   . Diabetes Mother   .  Hyperlipidemia Mother   . Hypertension Mother   . Heart disease Father   . Diabetes Father   . Hyperlipidemia Father   . Hypertension Father     SOCIAL HISTORY:  History   Social History  . Marital Status: Widowed    Spouse Name: N/A    Number of Children: N/A  . Years of Education: N/A   Occupational History  . Not on file.   Social History Main Topics  . Smoking status: Never Smoker   . Smokeless tobacco: Not on file  . Alcohol Use: No  . Drug Use: No  . Sexual Activity: Not on file   Other Topics Concern  . Not on file   Social History Narrative    Patient is a widow. She has 11 children 5 of who are living. She is retired in 1993 from [employer]. She denies tobacco alcohol or drug use.     PHYSICAL EXAM   There were no vitals filed  for this visit.  Not recorded    There is no weight on file to calculate BMI.   Generalized: In no acute distress  Neck: Supple, no carotid bruits   Cardiac: Regular rate rhythm  Pulmonary: Clear to auscultation bilaterally  Musculoskeletal: No deformity  Neurological examination  Mentation: Alert oriented to time, place, history taking, and causual conversation  Cranial nerve II-XII: Right Pupil was round round reactive to light, able to read moderate print. Left pupil was irregular, left exotropia, no light perception. Visual field were full on confrontational test. Bilateral fundi were sharp.  Facial sensation and strength were normal. Hearing was intact to finger rubbing bilaterally. Uvula tongue midline.  Head turning and shoulder shrug and were normal and symmetric.Tongue protrusion into cheek strength was normal.  Motor: limited exam on left ar due to left shoulder elbow pain, no significant weakness noticed, right elbow flexion 4+, shoulder abduction 4, external rotation4, elbow extension 4+ wrist flexion and extension 5 grip 5-, ,   Sensory: Intact to fine touch, pinprick, preserved vibratory sensation, and  proprioception at toes.  Coordination: Normal finger to nose, heel-to-shin bilaterally there was no truncal ataxia  Gait: Rising up from seated position without assistance, normal stance,  mild stiff gait,  smooth turning, able to perform tiptoe, and heel walking Romberg signs: Negative  Deep tendon reflexes: Brachioradialis 2/2, biceps 2/2, triceps 2/2, patellar 2/2, Achilles 2/2, plantar responses were flexor bilaterally.   DIAGNOSTIC DATA (LABS, IMAGING, TESTING) - I reviewed patient records, labs, notes, testing and imaging myself where available.  Lab Results  Component Value Date   WBC 5.5 12/13/2013   HGB 10.8* 12/13/2013   HCT 33.4* 12/13/2013   MCV 90.5 12/13/2013   PLT 272 12/13/2013      Component Value Date/Time   NA 137 12/13/2013 1635   K 4.6 12/13/2013 1635   CL 102 12/13/2013 1635   CO2 26 12/13/2013 1635   GLUCOSE 151* 12/13/2013 1635   BUN 34* 12/13/2013 1635   CREATININE 1.40* 12/13/2013 1635   CREATININE 1.99* 04/16/2010 1724   CALCIUM 9.4 12/13/2013 1635   PROT 7.0 12/13/2013 1635   ALBUMIN 3.8 12/13/2013 1635   AST 16 12/13/2013 1635   ALT 8 12/13/2013 1635   ALKPHOS 87 12/13/2013 1635   BILITOT 0.5 12/13/2013 1635   GFRNONAA 30* 12/10/2010 1117   GFRAA 36* 12/10/2010 1117   Lab Results  Component Value Date   CHOL 135 10/04/2012   HDL 44 10/04/2012   LDLCALC 69 10/04/2012   TRIG 109 10/04/2012   CHOLHDL 3.1 10/04/2012   Lab Results  Component Value Date   HGBA1C 6.8 02/27/2014   Lab Results  Component Value Date   VITAMINB12 530 07/09/2009   Lab Results  Component Value Date   TSH 2.476 04/26/2007    ASSESSMENT AND PLAN  Chelsea Mcdaniel is a 80 y.o. female complains of Left shoulder, left elbow pain, six-month history of gradual worsening right arm weakness, mild gait difficulty, bladder incontinence, electrodiagnostic study has demonstrated severe bilateral carpal tunnel syndromes, cervical spine has demonstrated severe degenerative disc disease, multilevel  foraminal stenosis, mild to moderate canal stenosis, most severe at C3-4, C4-5, C5-6 level, there was also evidence of chronic right lumbar radiculopathy  After discussed with patient and her family, I will complete evaluation with MRI of lumbar spine, Return to clinic in one month   Chelsea Mcdaniel, M.D. Ph.D.  [facility] [address] [place], [place] [address] [phone]

## 2014-03-22 NOTE — Procedures (Addendum)
   NCS (NERVE CONDUCTION STUDY) WITH EMG (ELECTROMYOGRAPHY) REPORT   STUDY DATE: March 21 2014 PATIENT NAME: Chelsea Mcdaniel DOB: 06-28-1928 MRN: [id number]    TECHNOLOGIST: Laretta Alstrom ELECTROMYOGRAPHER: Marcial Pacas M.D.  CLINICAL INFORMATION:  80 years old [place] American female, with past medical history of chronic neck pain, presenting progressive worsening bilateral hands muscle weakness, atrophy  FINDINGS: NERVE CONDUCTION STUDY: Bilateral median sensory, and motor responses were absent.  Bilateral ulnar sensory and motor responses were present, and within normal limits.  Right peroneal sensory response was absent, right peroneal, tibial motor responses showed moderately decreased C. map amplitude, with normal distal latency, conduction velocity. Right tibial H. reflexes were absent.   NEEDLE ELECTROMYOGRAPHY: Selected needle examination was performed at right upper extremity muscles, right cervical paraspinal muscles. Right lower extremity muscles, right lumbosacral  Right abductor pollicis brevis: Increased insertion activity, no spontaneous activity, enlarged complex motor unit potential, with decreased recruitment patterns  Right first dorsal interossei: Increased insertion activity, 1 plus spontaneous activity,enlarged complex motor unit potential, with decreased recruitment patterns  Right extensor digital communis, pronator teres, biceps, triceps,: Normally insertion activity, no spontaneous activity, mildly enlarged complex motor unit potential, with decreased recruitment patterns   There was no spontaneous activity at right cervical paraspinal muscles, right C5, 6, 7  Selected needle examination was also performed at the right lower extremity muscles, there was evidence of chronic neuropathic changes at right tibialis anterior tibialis posterior, medial gastrocnemius, vastus lateralis  There was no spontaneous activity at right lumbosacral paraspinal muscles right  L4-5 S1  IMPRESSION:   This is an abnormal study. There was electrodiagnostic evidence of chronic right cervical radiculopathy, mainly involving right C8 T1, there was also evidence of severe bilateral carpal tunnel syndromes.  INTERPRETING PHYSICIAN:   Marcial Pacas M.D. Ph.D. [facility] [address] [place], [place] [address] [phone]

## 2014-03-25 ENCOUNTER — Ambulatory Visit (INDEPENDENT_AMBULATORY_CARE_PROVIDER_SITE_OTHER): Payer: PRIVATE HEALTH INSURANCE | Admitting: Pharmacist

## 2014-03-25 DIAGNOSIS — Z7902 Long term (current) use of antithrombotics/antiplatelets: Secondary | ICD-10-CM

## 2014-03-25 DIAGNOSIS — I6330 Cerebral infarction due to thrombosis of unspecified cerebral artery: Secondary | ICD-10-CM

## 2014-03-25 LAB — POCT INR: INR: 1.4

## 2014-03-25 NOTE — Patient Instructions (Signed)
Patient instructed to take medications as defined in the Anti-coagulation Track section of this encounter.  Patient instructed to take today's dose.  Patient verbalized understanding of these instructions.    

## 2014-03-25 NOTE — Progress Notes (Signed)
Anti-Coagulation Progress Note  Chelsea Mcdaniel is a 80 y.o. female who is currently on an anti-coagulation regimen.    RECENT RESULTS: Recent results are below, the most recent result is correlated with a dose of 30 mg per week (Patient was supposed to have been on 37.5mg /week but missed one dose by mistake 8/30): Lab Results  Component Value Date   INR 1.4 03/25/2014   INR 3.00 03/04/2014   INR 3.00 02/11/2014    ANTI-COAG DOSE: Anticoagulation Dose Instructions as of 03/25/2014     [name] Tue Wed Thu Fri Sat   New Dose 7.5 mg 3.75 mg 7.5 mg 3.75 mg 7.5 mg 3.75 mg 3.75 mg       ANTICOAG SUMMARY: Anticoagulation Episode Summary   Current INR goal    Next INR check 04/15/2014   INR from last check 1.4 (03/25/2014)   Weekly max dose    Target end date    INR check location    Preferred lab    Send INR reminders to       Comments         ANTICOAG TODAY: Anticoagulation Summary as of 03/25/2014   INR goal    Selected INR 1.4 (03/25/2014)   Next INR check 04/15/2014   Target end date     Anticoagulation Episode Summary   INR check location    Preferred lab    Send INR reminders to    Comments       PATIENT INSTRUCTIONS: Patient Instructions  Patient instructed to take medications as defined in the Anti-coagulation Track section of this encounter.  Patient instructed to take today's dose.  Patient verbalized understanding of these instructions.       FOLLOW-UP Return in 3 weeks (on 04/15/2014) for Follow-up INR at [place], PharmD Clinical Pharmacy Resident

## 2014-03-27 NOTE — Progress Notes (Signed)
Indication: Previus DVT and CVAs on plavix and ASA. Duration: As long as benefits outweigh risks. INR: Below target. Agree with Dr. Halina Maidens assessment and plan.

## 2014-03-29 ENCOUNTER — Ambulatory Visit
Admission: RE | Admit: 2014-03-29 | Discharge: 2014-03-29 | Disposition: A | Discharge status: Home / Self Care | Payer: PRIVATE HEALTH INSURANCE | Source: Ambulatory Visit | Attending: Neurology | Admitting: Neurology

## 2014-03-29 DIAGNOSIS — M545 Low back pain, unspecified: Secondary | ICD-10-CM

## 2014-03-29 DIAGNOSIS — M62541 Muscle wasting and atrophy, not elsewhere classified, right hand: Secondary | ICD-10-CM

## 2014-03-29 DIAGNOSIS — R29898 Other symptoms and signs involving the musculoskeletal system: Secondary | ICD-10-CM

## 2014-03-29 DIAGNOSIS — M549 Dorsalgia, unspecified: Secondary | ICD-10-CM

## 2014-04-02 ENCOUNTER — Telehealth: Payer: Self-pay | Admitting: Neurology

## 2014-04-02 NOTE — Telephone Encounter (Signed)
I have called her,  abnormal MRI scan the lumbar spine showing prominent spondylitic changes about most severe at L4-5 where there is severe right-sided foraminal narrowing and likely encroachment on the L4 nerve root. There is also bilateral foraminal narrowing at L3-4 and L2-3.  She has an appt in Apr 25 2014.

## 2014-04-15 ENCOUNTER — Ambulatory Visit (INDEPENDENT_AMBULATORY_CARE_PROVIDER_SITE_OTHER): Payer: PRIVATE HEALTH INSURANCE | Admitting: Pharmacist

## 2014-04-15 ENCOUNTER — Other Ambulatory Visit: Payer: Self-pay | Admitting: Internal Medicine

## 2014-04-15 DIAGNOSIS — Z7901 Long term (current) use of anticoagulants: Secondary | ICD-10-CM

## 2014-04-15 DIAGNOSIS — I10 Essential (primary) hypertension: Secondary | ICD-10-CM

## 2014-04-15 LAB — POCT INR: INR: 3

## 2014-04-15 NOTE — Progress Notes (Signed)
Indication: Previus DVT and CVAs while on plavix and ASA. Duration: As long as benefits outweigh risks. INR: At target. Agree with Dr. Gladstone Pih assessment and plan.

## 2014-04-15 NOTE — Patient Instructions (Signed)
Patient instructed to take medications as defined in the Anti-coagulation Track section of this encounter.  Patient instructed to take today's dose.  Patient verbalized understanding of these instructions.    

## 2014-04-15 NOTE — Progress Notes (Signed)
Anti-Coagulation Progress Note  Chelsea Mcdaniel is a 80 y.o. female who is currently on an anti-coagulation regimen.    RECENT RESULTS: Recent results are below, the most recent result is correlated with a dose of 41.25 mg. per week: Lab Results  Component Value Date   INR 3.00 04/15/2014   INR 1.4 03/25/2014   INR 3.00 03/04/2014    ANTI-COAG DOSE: Anticoagulation Dose Instructions as of 04/15/2014     [name] Tue Wed Thu Fri Sat   New Dose 3.75 mg 7.5 mg 3.75 mg 7.5 mg 3.75 mg 7.5 mg 3.75 mg       ANTICOAG SUMMARY: Anticoagulation Episode Summary   Current INR goal    Next INR check 05/06/2014   INR from last check 3.00 (04/15/2014)   Weekly max dose    Target end date    INR check location    Preferred lab    Send INR reminders to       Comments         ANTICOAG TODAY: Anticoagulation Summary as of 04/15/2014   INR goal    Selected INR 3.00 (04/15/2014)   Next INR check 05/06/2014   Target end date     Anticoagulation Episode Summary   INR check location    Preferred lab    Send INR reminders to    Comments       PATIENT INSTRUCTIONS: Patient Instructions  Patient instructed to take medications as defined in the Anti-coagulation Track section of this encounter.  Patient instructed to take today's dose.  Patient verbalized understanding of these instructions.       FOLLOW-UP Return in 3 weeks (on 05/06/2014) for Follow up INR at 4:15PM.  [name], III Pharm.D., CACP

## 2014-04-23 ENCOUNTER — Other Ambulatory Visit: Payer: Self-pay | Admitting: *Deleted

## 2014-04-23 DIAGNOSIS — I10 Essential (primary) hypertension: Secondary | ICD-10-CM

## 2014-04-24 MED ORDER — AMLODIPINE BESYLATE 10 MG PO TABS: 10.0000 mg | ORAL_TABLET | Freq: Every day | ORAL | Status: DC

## 2014-04-25 ENCOUNTER — Ambulatory Visit (INDEPENDENT_AMBULATORY_CARE_PROVIDER_SITE_OTHER): Payer: PRIVATE HEALTH INSURANCE | Admitting: Neurology

## 2014-04-25 ENCOUNTER — Encounter: Payer: Self-pay | Admitting: Neurology

## 2014-04-25 ENCOUNTER — Encounter (INDEPENDENT_AMBULATORY_CARE_PROVIDER_SITE_OTHER): Payer: Self-pay

## 2014-04-25 VITALS — BP 154/69 | HR 69 | Ht 62.0 in | Wt 160.0 lb

## 2014-04-25 DIAGNOSIS — M62541 Muscle wasting and atrophy, not elsewhere classified, right hand: Secondary | ICD-10-CM

## 2014-04-25 DIAGNOSIS — G5603 Carpal tunnel syndrome, bilateral upper limbs: Secondary | ICD-10-CM

## 2014-04-25 DIAGNOSIS — G5602 Carpal tunnel syndrome, left upper limb: Secondary | ICD-10-CM

## 2014-04-25 DIAGNOSIS — G5601 Carpal tunnel syndrome, right upper limb: Secondary | ICD-10-CM

## 2014-04-25 DIAGNOSIS — M5412 Radiculopathy, cervical region: Secondary | ICD-10-CM

## 2014-04-25 DIAGNOSIS — M5416 Radiculopathy, lumbar region: Secondary | ICD-10-CM

## 2014-04-25 NOTE — Progress Notes (Signed)
PATIENT: Chelsea Mcdaniel DOB: 1927-09-04  HISTORICAL  Chelsea Mcdaniel  is a 80 y.o. Right handed woman referred by her primary care physician Boggala for evaluation of right arm weakness, right hand muscle atrophy  She PMH significant for HTN, DM, HLD, H/O CVA in 1985, involving her left side,1993 s/p right CEA, CKD stage III, and H/O DVT X2 on indefinite anticoagulation.  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 She has no significant numbness of her right hand.  She denies neck pain, no gait difficulty, no incontinence.   She denies right elbow, shoulder, wrist, hand pain, now she has left elbow, shoulder pain 1 weeks, she attributed it to gout,   She denies significant gait difficulty, no bowel bladder incontinence, no bilateral lower extremity paresthesia,  Update March 21 2014  Patient return for electrodiagnostic study today, which has demonstrates severe bilateral carpal tunnel syndromes, also chronic right cervical radiculopathy  I also reviewed MRI cervical spine with patient and her family 1. At C3-4: disc bulging and facet hypertrophy with moderate spinal stenosis and severe biforaminal stenosis; hazy T2 hyperintensity at C3 and C4 levels may indicate edema or gliosis  2. At C5-6: disc bulging and facet hypertrophy with mild spinal stenosis and severe biforaminal stenosis  3. At C4-5: disc bulging and facet hypertrophy with mild spinal stenosis and moderate biforaminal stenosis  4. At C6-7: uncovertebral joint hypertrophy and facet hypertrophy with moderate biforaminal stenosis  5. At C2-3: facet hypertrophy and uncovertebral joint hypertrophy with severe right foraminal stenosis  6. Multi-level degenerative changes including: Degenerative pannus  formation posterior to the dens, without contact upon the spinal cord. 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 Degenerative spondylosis and disc bulging from C2-3 to C5-6. Reversal of normal cervical curvature. Degenerative endplate disease with marrow edema at C5-6 anteriorly.    She denies significant neck pain, complains of chronic low back pain, mild worsening gait difficulty, occasionally bladder incontinence, also complains of low back pain,  UPDATE Apr 25 2014: She denies significant pain, she is not as active as she used to be, she lives by herself in an apartment, she has an Environmental consultant coming in 5 days a week about 2 hours,   She has mild gait difficulty, but denies bowel bladder incontinence, no significant neck, lower back pain,  We have reviewed MRI of cervical, and MRI lumbar spine: prominent spondylitic changes about most severe at L4-5 where there is severe right-sided foraminal narrowing and likely encroachment on the L4 nerve root. There is also bilateral foraminal narrowing at L3-4 and L2-3.  MRI cervical: Multi-level degenerative changes including: Degenerative pannus formation posterior to the dens, without contact upon the spinal cord. 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 Degenerative spondylosis and disc bulging from C2-3 to C5-6. Reversal of normal cervical curvature. Degenerative endplate disease with marrow edema at C5-6 anteriorly, moderate canal stenosis at C 3 and 4, severe bilateral foraminal stenosis, at C3-4, C 5 sixths, hazy T 2 hyperdensity,  Her complains of right hand weakness, are a combination of right cervical radiculopathy, and severe right carpal tunnel syndromes . REVIEW OF SYSTEMS: Full 14 system review of systems performed and notable only for swelling, loss of left  vision, constipation, joint pain, feeling cold,  ALLERGIES: No Known Allergies  HOME MEDICATIONS: Current Outpatient Prescriptions on  File Prior to Visit  Medication Sig Dispense Refill  . amLODipine (NORVASC) 10 MG tablet Take 1 tablet (10 mg total) by mouth daily. For blood pressure  90 tablet  1  . benazepril (LOTENSIN) 40 MG tablet Take 1 tablet (40 mg total) by mouth daily.  90 tablet  3  . Blood Glucose Monitoring Suppl (ACCU-CHEK AVIVA) device Use as instructed  1 each  0  . Calcium-Vitamin D (CHEWABLE CALCIUM/D) 300 MG CHEW Chew by mouth. Take as directed       . colchicine 0.6 MG tablet Initial 1.2mg  (2 tabs) at first sign of gout flare, followed in 1 hour with 0.6mg   10 tablet  1  . diclofenac sodium (VOLTAREN) 1 % GEL Apply 1 application topically 2 (two) times daily. To knees  1 Tube  5  . furosemide (LASIX) 40 MG tablet Take 40mg  in the morning and 20mg  with supper.  60 tablet  0  . glipiZIDE (GLUCOTROL XL) 10 MG 24 hr tablet TAKE ONE TABLET BY MOUTH EVERY DAY  90 tablet  0  . glucose blood (ACCU-CHEK AVIVA PLUS) test strip Use 1 time daily to check blood sugar. diag code 250.00. Non-insulin dependent  100 each  3  . Lancets (ACCU-CHEK SOFT TOUCH) lancets Use 1 time daily to check blood sugar. diag code 250.00.non-insulin dependent  100 each  3  . latanoprost (XALATAN) 0.005 % ophthalmic solution       . LUMIGAN 0.01 % SOLN       . metoprolol succinate (TOPROL-XL) 25 MG 24 hr tablet Take 0.5 tablets (12.5 mg total) by mouth daily.  45 tablet  0  . rosuvastatin (CRESTOR) 5 MG tablet TAKE ONE TABLET BY MOUTH AT BEDTIME  90 tablet  2  . sitaGLIPtin (JANUVIA) 50 MG tablet Take 1 tablet (50 mg total) by mouth daily.  90 tablet  4  . warfarin (COUMADIN) 7.5 MG tablet Take as directed by anticoagulation clinic provider. Take 1/2 tablet on Wednesdays; 1 tablet all other days.  30 tablet  2   No current facility-administered medications on file prior to visit.    PAST MEDICAL HISTORY: Past Medical History  Diagnosis Date  . Diabetes mellitus   . Hyperlipidemia   . Hypertension   . CVA (cerebrovascular accident)   October 2007     CT of the head atrophy with multiple remote insults noted but no definite acute findings  . Blindness of left eye      likely related to stroke, left cataract removed from that eye with complications  . Anemia      normocytic anemia with baseline hemoglobin 10-11  . Chronic kidney disease 2006     left renal artery stenosis with probable hemodynamic significance, kidneys are normal in morphology without focal lesions or hydronephrosis this is based but cannot A. of the abdomen with and without contrast done on the 31st 2006  . Glaucoma   . Personal history of DVT (deep vein thrombosis) 08/11/2010    PAST SURGICAL HISTORY: Past Surgical History  Procedure Laterality Date  . Transthoracic echocardiogram   May 2008     left ventricular systolic function normal EF estimated range of 55-60%, no definite diagnostic evidence of left ventricular regional wall motion abnormalities, Doppler parameters consistent with abnormal left ventricular relaxation, findings suggestive of possible bicuspid aortic valve and aortic valve thickness mildly increase  FAMILY HISTORY: Family History  Problem Relation Age of Onset  . Heart disease Mother   . Diabetes Mother   . Hyperlipidemia Mother   . Hypertension Mother   . Heart disease Father   . Diabetes Father   . Hyperlipidemia Father   . Hypertension Father     SOCIAL HISTORY:  History   Social History  . Marital Status: Widowed    Spouse Name: N/A    Number of Children: N/A  . Years of Education: N/A   Occupational History  . Not on file.   Social History Main Topics  . Smoking status: Never Smoker   . Smokeless tobacco: Never Used  . Alcohol Use: No  . Drug Use: No  . Sexual Activity: Not on file   Other Topics Concern  . Not on file   Social History Narrative    Patient is a widow. She has 11 children 5 of who are living. She is retired in 1993 from [employer]. She denies tobacco alcohol or drug use.      PHYSICAL EXAM   Filed Vitals:   04/25/14 1436  BP: 154/69  Pulse: 69  Height: 5\' 2"  (1.575 m)  Weight: 160 lb (72.576 kg)    Not recorded    Body mass index is 29.26 kg/(m^2).   Generalized: In no acute distress  Neck: Supple, no carotid bruits   Cardiac: Regular rate rhythm  Pulmonary: Clear to auscultation bilaterally  Musculoskeletal: No deformity  Neurological examination  Mentation: Alert oriented to time, place, history taking, and causual conversation  Cranial nerve II-XII: Right Pupil was round round reactive to light, able to read moderate print. Left pupil was irregular, left exotropia, no light perception. Visual field were full on confrontational test. Bilateral fundi were sharp.  Facial sensation and strength were normal. Hearing was intact to finger rubbing bilaterally. Uvula tongue midline.  Head turning and shoulder shrug and were normal and symmetric.Tongue protrusion into cheek strength was normal.  Motor: limited exam on left ar due to left shoulder elbow pain, no significant weakness noticed, right elbow flexion 4+, shoulder abduction 4, external rotation4, elbow extension 4+ wrist flexion and extension 5 grip 5-, she has no significant bilateral lower extremity proximal and distal weakness  Sensory: Intact to fine touch, pinprick, preserved vibratory sensation, and proprioception at toes.  Coordination: Normal finger to nose, heel-to-shin bilaterally there was no truncal ataxia  Gait: Rising up from seated position without assistance, normal stance,  mild stiff gait,  smooth turning, she was able to stand on tiptoe, heels.  Deep tendon reflexes: Brachioradialis 2/2, biceps 2/2, triceps 2/2, patellar 2/2, Achilles trace, plantar responses were flexor bilaterally.   DIAGNOSTIC DATA (LABS, IMAGING, TESTING) - I reviewed patient records, labs, notes, testing and imaging myself where available.  Lab Results  Component Value Date   WBC 5.5  12/13/2013   HGB 10.8* 12/13/2013   HCT 33.4* 12/13/2013   MCV 90.5 12/13/2013   PLT 272 12/13/2013      Component Value Date/Time   NA 137 12/13/2013 1635   K 4.6 12/13/2013 1635   CL 102 12/13/2013 1635   CO2 26 12/13/2013 1635   GLUCOSE 151* 12/13/2013 1635   BUN 34* 12/13/2013 1635   CREATININE 1.40* 12/13/2013 1635   CREATININE 1.99* 04/16/2010 1724   CALCIUM 9.4 12/13/2013 1635   PROT 7.0 12/13/2013 1635   ALBUMIN 3.8 12/13/2013 1635   AST 16 12/13/2013 1635   ALT 8 12/13/2013 1635  ALKPHOS 87 12/13/2013 1635   BILITOT 0.5 12/13/2013 1635   GFRNONAA 30* 12/10/2010 1117   GFRAA 36* 12/10/2010 1117   Lab Results  Component Value Date   CHOL 135 10/04/2012   HDL 44 10/04/2012   LDLCALC 69 10/04/2012   TRIG 109 10/04/2012   CHOLHDL 3.1 10/04/2012   Lab Results  Component Value Date   HGBA1C 6.8 02/27/2014   Lab Results  Component Value Date   VITAMINB12 530 07/09/2009   Lab Results  Component Value Date   TSH 2.476 04/26/2007    ASSESSMENT AND PLAN  Princie Ivens is a 80 y.o. female complains of Left shoulder, left elbow pain, six-month history of gradual worsening right arm weakness, mild gait difficulty, bladder incontinence, electrodiagnostic study has demonstrated severe bilateral carpal tunnel syndromes, cervical spine has demonstrated severe degenerative disc disease, multilevel foraminal stenosis, moderate canal stenosis, most severe at C3-4, C4-5, C5-6 level, there was also evidence of chronic right lumbar radiculopathy, MRI lumbar spine: prominent spondylitic changes about most severe at L4-5 where there is severe right-sided foraminal narrowing and likely encroachment on the L4 nerve root. There is also bilateral foraminal narrowing at L3-4 and L2-3.  Her complains of right arm weakness, combination of right cervical radiculopathy, severe right carpal tunnel syndromes, she also has severe left-sided carpal tunnel syndromes,  She is not in significant pain, symptoms has been fairly  stable, I have advised her to return to clinic for new issues,  Marcial Pacas, M.D. Ph.D.  [facility] [address], [place], [place] [address] [phone]

## 2014-05-03 ENCOUNTER — Other Ambulatory Visit: Payer: Self-pay | Admitting: Internal Medicine

## 2014-05-06 ENCOUNTER — Ambulatory Visit (INDEPENDENT_AMBULATORY_CARE_PROVIDER_SITE_OTHER): Payer: PRIVATE HEALTH INSURANCE | Admitting: Pharmacist

## 2014-05-06 DIAGNOSIS — Z7901 Long term (current) use of anticoagulants: Secondary | ICD-10-CM

## 2014-05-06 LAB — POCT INR: INR: 2.1

## 2014-05-06 NOTE — Patient Instructions (Signed)
Patient instructed to take medications as defined in the Anti-coagulation Track section of this encounter.  Patient instructed to take today's dose.  Patient verbalized understanding of these instructions.    

## 2014-05-06 NOTE — Progress Notes (Signed)
Anti-Coagulation Progress Note  Chelsea Mcdaniel is a 80 y.o. female who is currently on an anti-coagulation regimen.    RECENT RESULTS: Recent results are below, the most recent result is correlated with a dose of 37.5 mg. per week: Lab Results  Component Value Date   INR 2.1 05/06/2014   INR 3.00 04/15/2014   INR 1.4 03/25/2014    ANTI-COAG DOSE: Anticoagulation Dose Instructions as of 05/06/2014     [name] Tue Wed Thu Fri Sat   New Dose 3.75 mg 7.5 mg 3.75 mg 7.5 mg 3.75 mg 7.5 mg 3.75 mg       ANTICOAG SUMMARY: Anticoagulation Episode Summary   Current INR goal    Next INR check 05/27/2014   INR from last check 2.1 (05/06/2014)   Weekly max dose    Target end date    INR check location    Preferred lab    Send INR reminders to       Comments         ANTICOAG TODAY: Anticoagulation Summary as of 05/06/2014   INR goal    Selected INR 2.1 (05/06/2014)   Next INR check 05/27/2014   Target end date     Anticoagulation Episode Summary   INR check location    Preferred lab    Send INR reminders to    Comments       PATIENT INSTRUCTIONS: Patient Instructions  Patient instructed to take medications as defined in the Anti-coagulation Track section of this encounter.  Patient instructed to take today's dose.  Patient verbalized understanding of these instructions.        FOLLOW-UP Return in 3 weeks (on 05/27/2014) for follow-up INR at [place], PharmD Clinical Pharmacy Resident

## 2014-05-10 NOTE — Progress Notes (Signed)
On coumadin for VTE and CVA.  INR at goal.  I have reviewed the anticoagulation note.

## 2014-05-15 ENCOUNTER — Other Ambulatory Visit: Payer: Self-pay | Admitting: Internal Medicine

## 2014-05-15 MED ORDER — WARFARIN SODIUM 7.5 MG PO TABS: ORAL_TABLET | ORAL | Status: DC

## 2014-05-16 NOTE — Telephone Encounter (Signed)
rec'd call from dr Loura Back, per dr Redmond Pulling called the pharmacy and clarified pt's warfarin script.

## 2014-05-27 ENCOUNTER — Ambulatory Visit (INDEPENDENT_AMBULATORY_CARE_PROVIDER_SITE_OTHER): Payer: PRIVATE HEALTH INSURANCE | Admitting: Pharmacist

## 2014-05-27 DIAGNOSIS — Z7901 Long term (current) use of anticoagulants: Secondary | ICD-10-CM

## 2014-05-27 DIAGNOSIS — I639 Cerebral infarction, unspecified: Secondary | ICD-10-CM

## 2014-05-27 LAB — POCT INR: INR: 2.3

## 2014-05-27 NOTE — Progress Notes (Signed)
Anti-Coagulation Progress Note  Chelsea Mcdaniel is a 80 y.o. female who is currently on an anti-coagulation regimen.    RECENT RESULTS: Recent results are below, the most recent result is correlated with a dose of 37.5 mg. per week: Lab Results  Component Value Date   INR 2.30 05/27/2014   INR 2.1 05/06/2014   INR 3.00 04/15/2014    ANTI-COAG DOSE: Anticoagulation Dose Instructions as of 05/27/2014      [name] Tue Wed Thu Fri Sat   New Dose 3.75 mg 7.5 mg 3.75 mg 7.5 mg 3.75 mg 7.5 mg 3.75 mg       ANTICOAG SUMMARY: Anticoagulation Episode Summary    Current INR goal    Next INR check 06/17/2014   INR from last check 2.30 (05/27/2014)   Weekly max dose    Target end date    INR check location    Preferred lab    Send INR reminders to       Comments         ANTICOAG TODAY: Anticoagulation Summary as of 05/27/2014    INR goal    Selected INR 2.30 (05/27/2014)   Next INR check 06/17/2014   Target end date     Anticoagulation Episode Summary    INR check location    Preferred lab    Send INR reminders to    Comments       PATIENT INSTRUCTIONS: Patient Instructions  Patient instructed to take medications as defined in the Anti-coagulation Track section of this encounter.  Patient instructed to take today's dose.  Patient verbalized understanding of these instructions.       FOLLOW-UP Return in 3 weeks (on 06/17/2014) for Follow up INR at 3:15PM.  [name], III Pharm.D., CACP

## 2014-05-27 NOTE — Patient Instructions (Signed)
Patient instructed to take medications as defined in the Anti-coagulation Track section of this encounter.  Patient instructed to take today's dose.  Patient verbalized understanding of these instructions.    

## 2014-05-28 NOTE — Progress Notes (Signed)
Patient on anticoagulation for DVT and CVA.  INR at goal.  I have reviewed Dr. Gladstone Pih note.

## 2014-06-12 ENCOUNTER — Encounter: Payer: Self-pay | Admitting: Internal Medicine

## 2014-06-12 ENCOUNTER — Ambulatory Visit (INDEPENDENT_AMBULATORY_CARE_PROVIDER_SITE_OTHER): Payer: PRIVATE HEALTH INSURANCE | Admitting: Internal Medicine

## 2014-06-12 VITALS — BP 143/55 | HR 66 | Temp 97.5°F | Ht 62.0 in | Wt 160.4 lb

## 2014-06-12 DIAGNOSIS — E785 Hyperlipidemia, unspecified: Secondary | ICD-10-CM

## 2014-06-12 DIAGNOSIS — N183 Chronic kidney disease, stage 3 unspecified: Secondary | ICD-10-CM

## 2014-06-12 DIAGNOSIS — Z0000 Encounter for general adult medical examination without abnormal findings: Secondary | ICD-10-CM

## 2014-06-12 DIAGNOSIS — I10 Essential (primary) hypertension: Secondary | ICD-10-CM

## 2014-06-12 DIAGNOSIS — D649 Anemia, unspecified: Secondary | ICD-10-CM

## 2014-06-12 DIAGNOSIS — E119 Type 2 diabetes mellitus without complications: Secondary | ICD-10-CM

## 2014-06-12 LAB — POCT GLYCOSYLATED HEMOGLOBIN (HGB A1C): HEMOGLOBIN A1C: 6.6

## 2014-06-12 LAB — GLUCOSE, CAPILLARY: Glucose-Capillary: 234 mg/dL — ABNORMAL HIGH (ref 70–99)

## 2014-06-12 NOTE — Progress Notes (Signed)
Case discussed with Dr. Wilson soon after the resident saw the patient. We reviewed the resident's history and exam and pertinent patient test results. I agree with the assessment, diagnosis, and plan of care documented in the resident's note. 

## 2014-06-12 NOTE — Progress Notes (Signed)
   Subjective:    Patient ID: Chelsea Mcdaniel, female    DOB: 1928-05-07, 80 y.o.   MRN: [id number]  HPI Comments: Chelsea Mcdaniel is an 80 year old with PMH of HTN, hx of DVT and CVA (on chronic coumadin), DM (HgbA1c 6.02 Mar 2014), OA, CKD3, anemia and gout here for routine check-up.  Please see problem based assessment and plan for update of chronic conditions.      Review of Systems  Constitutional: Negative for fever, chills and appetite change.  Respiratory: Negative for shortness of breath.   Cardiovascular: Negative for chest pain and palpitations.  Gastrointestinal: Negative for nausea, vomiting, diarrhea, constipation and blood in stool.  Genitourinary: Negative for dysuria and hematuria.  Neurological: Negative for syncope and light-headedness.       Objective:   Physical Exam  Constitutional: She is oriented to person, place, and time. She appears well-developed. No distress.  HENT:  Head: Normocephalic and atraumatic.  Mouth/Throat: Oropharynx is clear and moist. No oropharyngeal exudate.  Eyes: EOM are normal.  Right eye PERRL; non-reactive left pupil noted on prior exams.  Cardiovascular: Normal rate and regular rhythm.  Exam reveals no gallop and no friction rub.   No murmur heard. Pulmonary/Chest: Effort normal and breath sounds normal. No respiratory distress. She has no wheezes. She has no rales.  Abdominal: Soft. Bowel sounds are normal. She exhibits no distension. There is no tenderness. There is no rebound.  Musculoskeletal: Normal range of motion. She exhibits no edema or tenderness.  Neurological: She is alert and oriented to person, place, and time. No cranial nerve deficit.  Skin: Skin is warm. She is not diaphoretic.  Psychiatric: She has a normal mood and affect. Her behavior is normal.  Vitals reviewed.         Assessment & Plan:  Please see problem based assessment and plan.

## 2014-06-13 NOTE — Assessment & Plan Note (Signed)
She declines flu, shingles vaccines.  Will refer to ophthalmology.

## 2014-06-13 NOTE — Assessment & Plan Note (Addendum)
Lab closed prior to end of visit.  No S&S of blood loss.  She follows with Dr. Elie Confer for management of coumadin/INR checks.  Will need CBC next visit.

## 2014-06-13 NOTE — Assessment & Plan Note (Signed)
Lab closed prior to end of our visit.  Will need lipid panel next visit and consideration of need to continue statin in patient of this age. If she continues to tolerate will plan to continue.

## 2014-06-13 NOTE — Assessment & Plan Note (Signed)
Lab closed prior to the end of our visit.  Will need BMP next visit.

## 2014-06-13 NOTE — Assessment & Plan Note (Addendum)
Lab Results  Component Value Date   HGBA1C 6.6 06/12/2014   HGBA1C 6.8 02/27/2014   HGBA1C 6.9 12/13/2013     Assessment: Diabetes control:  well controlled Progress toward A1C goal:   at goal Comments: She brought her meter but it was not downloaded prior to visit.  A1c continues to improve and she reports no CBGs < 100 or > 200 so will continue current therapy.  Plan: Medications:  continue current medications: Januvia 50mg  daily, glipizide 10mg  daily Home glucose monitoring: 2-3 times per day Frequency:   Timing:   Instruction/counseling given: reminded to get eye exam Educational resources provided: brochure (has information ) Self management tools provided: copy of home glucose meter download Other plans: Recheck A1c in 3 months.  Check BMP next visit.  CrCl 34 based on most recent Cr (6 months ago).  Januvia 50mg  can be used with Cr > 30.  If her renal function has worsened, will need to decrease to 25mg  daily.  Will plan to check labs next month (or sooner if she needs to come to clinic for another reason).

## 2014-06-13 NOTE — Assessment & Plan Note (Signed)
BP Readings from Last 3 Encounters:  06/12/14 143/55  04/25/14 154/69  02/27/14 135/67    Lab Results  Component Value Date   NA 137 12/13/2013   K 4.6 12/13/2013   CREATININE 1.40* 12/13/2013    Assessment: Blood pressure control:  controlled Progress toward BP goal:   at goal (<150/90 for > age 78)  Plan: Medications:  continue current medications:Toprol XL 12.5mg  daily, Lasix 40mg  AM 20mg  PM, Lotensin 40mg  daily, Norvasc 10mg  daily Educational resources provided:  (has information) Other plans: Lab closed before our visit ended.  Will plan for BMP next visit.

## 2014-06-17 ENCOUNTER — Ambulatory Visit (INDEPENDENT_AMBULATORY_CARE_PROVIDER_SITE_OTHER): Payer: PRIVATE HEALTH INSURANCE | Admitting: Pharmacist

## 2014-06-17 ENCOUNTER — Other Ambulatory Visit: Payer: Self-pay | Admitting: Internal Medicine

## 2014-06-17 DIAGNOSIS — Z7901 Long term (current) use of anticoagulants: Secondary | ICD-10-CM

## 2014-06-17 DIAGNOSIS — Z86718 Personal history of other venous thrombosis and embolism: Secondary | ICD-10-CM

## 2014-06-17 LAB — POCT INR: INR: 2

## 2014-06-17 NOTE — Progress Notes (Signed)
Anti-Coagulation Progress Note  Rhen Marck is a 80 y.o. female who is currently on an anti-coagulation regimen.    RECENT RESULTS: Recent results are below, the most recent result is correlated with a dose of 37.5 mg. per week: Lab Results  Component Value Date   INR 2.00 06/17/2014   INR 2.30 05/27/2014   INR 2.1 05/06/2014    ANTI-COAG DOSE: Anticoagulation Dose Instructions as of 06/17/2014      [name] Tue Wed Thu Fri Sat   New Dose 3.75 mg 7.5 mg 3.75 mg 7.5 mg 3.75 mg 7.5 mg 3.75 mg       ANTICOAG SUMMARY: Anticoagulation Episode Summary    Current INR goal    Next INR check 07/08/2014   INR from last check 2.00 (06/17/2014)   Weekly max dose    Target end date    INR check location    Preferred lab    Send INR reminders to       Comments         ANTICOAG TODAY: Anticoagulation Summary as of 06/17/2014    INR goal    Selected INR 2.00 (06/17/2014)   Next INR check 07/08/2014   Target end date     Anticoagulation Episode Summary    INR check location    Preferred lab    Send INR reminders to    Comments       PATIENT INSTRUCTIONS: Patient Instructions  Patient instructed to take medications as defined in the Anti-coagulation Track section of this encounter.  Patient instructed to take today's dose.  Patient verbalized understanding of these instructions.       FOLLOW-UP Return in 3 weeks (on 07/08/2014) for Follow up INR at 3:30PM.  [name], III Pharm.D., CACP

## 2014-06-17 NOTE — Patient Instructions (Signed)
Patient instructed to take medications as defined in the Anti-coagulation Track section of this encounter.  Patient instructed to take today's dose.  Patient verbalized understanding of these instructions.    

## 2014-07-08 ENCOUNTER — Ambulatory Visit (INDEPENDENT_AMBULATORY_CARE_PROVIDER_SITE_OTHER): Payer: PRIVATE HEALTH INSURANCE | Admitting: Pharmacist

## 2014-07-08 DIAGNOSIS — Z7901 Long term (current) use of anticoagulants: Secondary | ICD-10-CM

## 2014-07-08 LAB — POCT INR: INR: 1.3

## 2014-07-08 NOTE — Progress Notes (Signed)
Anti-Coagulation Progress Note  Chelsea Mcdaniel is a 80 y.o. female who is currently on an anti-coagulation regimen.    RECENT RESULTS: Recent results are below, the most recent result is correlated with a dose of 37.5 mg. per week: Lab Results  Component Value Date   INR 1.30 07/08/2014   INR 2.00 06/17/2014   INR 2.30 05/27/2014    ANTI-COAG DOSE: Anticoagulation Dose Instructions as of 07/08/2014      [name] Tue Wed Thu Fri Sat   New Dose 3.75 mg 7.5 mg 7.5 mg 7.5 mg 3.75 mg 7.5 mg 3.75 mg       ANTICOAG SUMMARY: Anticoagulation Episode Summary    Current INR goal    Next INR check 08/05/2014   INR from last check 1.30 (07/08/2014)   Weekly max dose    Target end date    INR check location    Preferred lab    Send INR reminders to       Comments         ANTICOAG TODAY: Anticoagulation Summary as of 07/08/2014    INR goal    Selected INR 1.30 (07/08/2014)   Next INR check 08/05/2014   Target end date     Anticoagulation Episode Summary    INR check location    Preferred lab    Send INR reminders to    Comments       PATIENT INSTRUCTIONS: Patient Instructions  Patient instructed to take medications as defined in the Anti-coagulation Track section of this encounter.  Patient instructed to take today's dose.  Patient verbalized understanding of these instructions.       FOLLOW-UP Return in 4 weeks (on 08/05/2014) for Follow up INR at 3:30PM.  [name], III Pharm.D., CACP

## 2014-07-08 NOTE — Patient Instructions (Signed)
Patient instructed to take medications as defined in the Anti-coagulation Track section of this encounter.  Patient instructed to take today's dose.  Patient verbalized understanding of these instructions.    

## 2014-07-29 ENCOUNTER — Other Ambulatory Visit: Payer: Self-pay | Admitting: Internal Medicine

## 2014-07-29 DIAGNOSIS — E119 Type 2 diabetes mellitus without complications: Secondary | ICD-10-CM | POA: Diagnosis not present

## 2014-07-30 DIAGNOSIS — E119 Type 2 diabetes mellitus without complications: Secondary | ICD-10-CM | POA: Diagnosis not present

## 2014-07-31 DIAGNOSIS — E119 Type 2 diabetes mellitus without complications: Secondary | ICD-10-CM | POA: Diagnosis not present

## 2014-08-02 DIAGNOSIS — E119 Type 2 diabetes mellitus without complications: Secondary | ICD-10-CM | POA: Diagnosis not present

## 2014-08-05 ENCOUNTER — Ambulatory Visit: Payer: Medicare Other

## 2014-08-05 ENCOUNTER — Other Ambulatory Visit: Payer: Self-pay | Admitting: Internal Medicine

## 2014-08-05 DIAGNOSIS — E119 Type 2 diabetes mellitus without complications: Secondary | ICD-10-CM | POA: Diagnosis not present

## 2014-08-06 DIAGNOSIS — E119 Type 2 diabetes mellitus without complications: Secondary | ICD-10-CM | POA: Diagnosis not present

## 2014-08-07 DIAGNOSIS — E119 Type 2 diabetes mellitus without complications: Secondary | ICD-10-CM | POA: Diagnosis not present

## 2014-08-08 DIAGNOSIS — E119 Type 2 diabetes mellitus without complications: Secondary | ICD-10-CM | POA: Diagnosis not present

## 2014-08-09 DIAGNOSIS — E119 Type 2 diabetes mellitus without complications: Secondary | ICD-10-CM | POA: Diagnosis not present

## 2014-08-12 ENCOUNTER — Other Ambulatory Visit: Payer: Self-pay | Admitting: Internal Medicine

## 2014-08-12 DIAGNOSIS — E119 Type 2 diabetes mellitus without complications: Secondary | ICD-10-CM | POA: Diagnosis not present

## 2014-08-13 DIAGNOSIS — E119 Type 2 diabetes mellitus without complications: Secondary | ICD-10-CM | POA: Diagnosis not present

## 2014-08-14 DIAGNOSIS — E119 Type 2 diabetes mellitus without complications: Secondary | ICD-10-CM | POA: Diagnosis not present

## 2014-08-15 DIAGNOSIS — E119 Type 2 diabetes mellitus without complications: Secondary | ICD-10-CM | POA: Diagnosis not present

## 2014-08-16 DIAGNOSIS — E119 Type 2 diabetes mellitus without complications: Secondary | ICD-10-CM | POA: Diagnosis not present

## 2014-08-20 DIAGNOSIS — E119 Type 2 diabetes mellitus without complications: Secondary | ICD-10-CM | POA: Diagnosis not present

## 2014-08-21 DIAGNOSIS — E119 Type 2 diabetes mellitus without complications: Secondary | ICD-10-CM | POA: Diagnosis not present

## 2014-08-22 ENCOUNTER — Encounter: Payer: Self-pay | Admitting: Internal Medicine

## 2014-08-22 ENCOUNTER — Ambulatory Visit (INDEPENDENT_AMBULATORY_CARE_PROVIDER_SITE_OTHER): Payer: Medicare Other | Admitting: Internal Medicine

## 2014-08-22 VITALS — BP 155/49 | HR 69 | Temp 97.4°F | Ht 62.0 in

## 2014-08-22 DIAGNOSIS — D631 Anemia in chronic kidney disease: Secondary | ICD-10-CM

## 2014-08-22 DIAGNOSIS — N183 Chronic kidney disease, stage 3 unspecified: Secondary | ICD-10-CM

## 2014-08-22 DIAGNOSIS — E785 Hyperlipidemia, unspecified: Secondary | ICD-10-CM

## 2014-08-22 DIAGNOSIS — M25562 Pain in left knee: Secondary | ICD-10-CM

## 2014-08-22 DIAGNOSIS — E119 Type 2 diabetes mellitus without complications: Secondary | ICD-10-CM | POA: Diagnosis not present

## 2014-08-22 DIAGNOSIS — I129 Hypertensive chronic kidney disease with stage 1 through stage 4 chronic kidney disease, or unspecified chronic kidney disease: Secondary | ICD-10-CM

## 2014-08-22 DIAGNOSIS — D649 Anemia, unspecified: Secondary | ICD-10-CM

## 2014-08-22 DIAGNOSIS — I10 Essential (primary) hypertension: Secondary | ICD-10-CM | POA: Diagnosis not present

## 2014-08-22 DIAGNOSIS — D539 Nutritional anemia, unspecified: Secondary | ICD-10-CM | POA: Diagnosis not present

## 2014-08-22 LAB — CBC WITH DIFFERENTIAL/PLATELET
BASOS ABS: 0.1 10*3/uL (ref 0.0–0.1)
BASOS PCT: 1 % (ref 0–1)
EOS PCT: 1 % (ref 0–5)
Eosinophils Absolute: 0.1 10*3/uL (ref 0.0–0.7)
HCT: 31.6 % — ABNORMAL LOW (ref 36.0–46.0)
Hemoglobin: 10 g/dL — ABNORMAL LOW (ref 12.0–15.0)
LYMPHS ABS: 1.8 10*3/uL (ref 0.7–4.0)
Lymphocytes Relative: 28 % (ref 12–46)
MCH: 29.2 pg (ref 26.0–34.0)
MCHC: 31.6 g/dL (ref 30.0–36.0)
MCV: 92.4 fL (ref 78.0–100.0)
MONO ABS: 0.6 10*3/uL (ref 0.1–1.0)
MPV: 9 fL (ref 8.6–12.4)
Monocytes Relative: 9 % (ref 3–12)
NEUTROS ABS: 4 10*3/uL (ref 1.7–7.7)
Neutrophils Relative %: 61 % (ref 43–77)
Platelets: 307 10*3/uL (ref 150–400)
RBC: 3.42 MIL/uL — ABNORMAL LOW (ref 3.87–5.11)
RDW: 13.4 % (ref 11.5–15.5)
WBC: 6.5 10*3/uL (ref 4.0–10.5)

## 2014-08-22 LAB — COMPLETE METABOLIC PANEL WITH GFR
ALBUMIN: 3.7 g/dL (ref 3.5–5.2)
ALK PHOS: 95 U/L (ref 39–117)
ALT: 9 U/L (ref 0–35)
AST: 15 U/L (ref 0–37)
BUN: 33 mg/dL — AB (ref 6–23)
CO2: 25 mEq/L (ref 19–32)
Calcium: 8.7 mg/dL (ref 8.4–10.5)
Chloride: 103 mEq/L (ref 96–112)
Creat: 1.74 mg/dL — ABNORMAL HIGH (ref 0.50–1.10)
GFR, EST AFRICAN AMERICAN: 31 mL/min — AB
GFR, EST NON AFRICAN AMERICAN: 27 mL/min — AB
GLUCOSE: 257 mg/dL — AB (ref 70–99)
Potassium: 4.7 mEq/L (ref 3.5–5.3)
Sodium: 137 mEq/L (ref 135–145)
Total Bilirubin: 0.7 mg/dL (ref 0.2–1.2)
Total Protein: 6.6 g/dL (ref 6.0–8.3)

## 2014-08-22 LAB — ANEMIA PANEL
%SAT: 9 % — ABNORMAL LOW (ref 20–55)
ABS RETIC: 44.5 10*3/uL (ref 19.0–186.0)
FERRITIN: 283 ng/mL (ref 10–291)
Folate: >20 ng/mL
Iron: 20 ug/dL — ABNORMAL LOW (ref 42–145)
RBC.: 3.42 MIL/uL — AB (ref 3.87–5.11)
RETIC CT PCT: 1.3 % (ref 0.4–2.3)
TIBC: 217 ug/dL — AB (ref 250–470)
UIBC: 197 ug/dL (ref 125–400)
Vitamin B-12: 456 pg/mL (ref 211–911)

## 2014-08-22 LAB — LIPID PANEL
Cholesterol: 122 mg/dL (ref 0–200)
HDL: 40 mg/dL (ref >39)
LDL CALC: 64 mg/dL (ref 0–99)
TRIGLYCERIDES: 89 mg/dL (ref <150)
Total CHOL/HDL Ratio: 3.1 Ratio
VLDL: 18 mg/dL (ref 0–40)

## 2014-08-22 MED ORDER — ACETAMINOPHEN 325 MG PO TABS: 650.0000 mg | ORAL_TABLET | Freq: Four times a day (QID) | ORAL | Status: DC | PRN

## 2014-08-22 NOTE — Patient Instructions (Signed)
-  Take tylenol 650 mg every 4-6 hrs as needed for pain -Will check you bloodwork today -Try to rest and put an ace wrap on your knee -If your pain does not improve, we can refer you to sports medicine where they can do imaging/steroid injection -We can also prescribe you tramadol for pain if you need it, please let us know -Nice meeting you today, you are due back in 1 month for diabetes check-up   General Instructions:   Please bring your medicines with you each time you come to clinic.  Medicines may include prescription medications, over-the-counter medications, herbal remedies, eye drops, vitamins, or other pills.   Progress Toward Treatment Goals:  Treatment Goal 02/27/2014  Hemoglobin A1C at goal  Blood pressure -    Self Care Goals & Plans:  Self Care Goal 06/12/2014  Manage my medications take my medicines as prescribed; bring my medications to every visit; refill my medications on time  Monitor my health keep track of my blood glucose; bring my glucose meter and log to each visit  Eat healthy foods drink diet soda or water instead of juice or soda; eat more vegetables; eat foods that are low in salt; eat baked foods instead of fried foods; eat fruit for snacks and desserts  Be physically active -  Meeting treatment goals -    Home Blood Glucose Monitoring 02/27/2014  Check my blood sugar 2 times a day  When to check my blood sugar -     Care Management & Community Referrals:  No flowsheet data found.

## 2014-08-22 NOTE — Progress Notes (Signed)
Patient ID: Chelsea Mcdaniel, female   DOB: March 22, 1928, 81 y.o.   MRN: [id number]     Subjective:   Patient ID: Chelsea Mcdaniel female   DOB: Mar 07, 1928 81 y.o.   MRN: [id number]  HPI: [name] is a 81 y.o. pleasant woman with past medical history of hypertension, hyperlipidemia, non-insulin Type 2 DM, CKD Stage 3, chronic normocytic anemia, non-tophaceous gout, CVA, and history of DVT on coumadin who presents with chief complaint of left knee pain.   She reports having left knee pain that began two days ago without preceding fall/injury/trauma. The pain is located on the anterior knee with more pain on the medial side and worse with getting up from a sitted position and better with rest. She denies fever, chills,  swelling, warmth, or erythema of her knee. She denies locking, giving out of her knee, or difficulty with ambulation which she uses a walker at baseline. She had xray imaging of left left knee in 1999 that revealed meniscal calcification (laterally) and spurring of the patella. She denies LE weakness, paraesthesias, calf pain, or hip/back pain. She has history of DVT and is compliant with taking coumadin. She has history of gout of her wrist and great toe with no recent flares and takes colchicine as needed. She denies having chronic knee pain however per chart review she has had history of bilateral knee pain thought to be due to osteoarthritis. She has never had intra-articular steroid knee injection before. She has been using OTC tylenol with mild relief and ace bandage with moderate relief. She has not applied warm or cold compresses.    She is compliant with taking lasix, norvasc, toprol-xl, and benazepril for hypertension. She has chronic blurry vision but denies chest pain, headache, LE edema, or lightheadedness.   She is compliant with taking crestor for hyperlipidemia with no myalgias.   She has history of chronic normocytic anemia with no prior anemia panel. She is s/p  hysterectomy and has never had screening colonoscopy. She has CKD Stage 3. She denies GI/GU bleeding and is not currently on iron replacement.     Past Medical History  Diagnosis Date  . Diabetes mellitus   . Hyperlipidemia   . Hypertension   . CVA (cerebrovascular accident)  October 2007     CT of the head atrophy with multiple remote insults noted but no definite acute findings  . Blindness of left eye      likely related to stroke, left cataract removed from that eye with complications  . Anemia      normocytic anemia with baseline hemoglobin 10-11  . Chronic kidney disease 2006     left renal artery stenosis with probable hemodynamic significance, kidneys are normal in morphology without focal lesions or hydronephrosis this is based but cannot A. of the abdomen with and without contrast done on the 31st 2006  . Glaucoma   . Personal history of DVT (deep vein thrombosis) 08/11/2010   Current Outpatient Prescriptions  Medication Sig Dispense Refill  . ACCU-CHEK SOFTCLIX LANCETS lancets Use 1 time daily to check blood sugar. 100 each 1  . amLODipine (NORVASC) 10 MG tablet Take 1 tablet (10 mg total) by mouth daily. For blood pressure 90 tablet 1  . benazepril (LOTENSIN) 40 MG tablet Take 1 tablet (40 mg total) by mouth daily. 90 tablet 3  . Blood Glucose Monitoring Suppl (ACCU-CHEK AVIVA) device Use as instructed 1 each 0  . Calcium-Vitamin D (CHEWABLE CALCIUM/D) 300 MG CHEW Chew by mouth. Take  as directed     . colchicine 0.6 MG tablet take 2 tabs at first sign of gout flare, followed in 1 hour with 1 tab 10 tablet 0  . diclofenac sodium (VOLTAREN) 1 % GEL Apply 1 application topically 2 (two) times daily. To knees 1 Tube 5  . furosemide (LASIX) 40 MG tablet Take 40mg  in the morning and 20mg  with supper. 60 tablet 2  . glipiZIDE (GLUCOTROL XL) 10 MG 24 hr tablet TAKE ONE TABLET BY MOUTH EVERY DAY 90 tablet 0  . glucose blood (ACCU-CHEK AVIVA PLUS) test strip Use 1 time daily to check  blood sugar. diag code 250.00. Non-insulin dependent 100 each 3  . latanoprost (XALATAN) 0.005 % ophthalmic solution     . LUMIGAN 0.01 % SOLN     . metoprolol succinate (TOPROL-XL) 25 MG 24 hr tablet Take 0.5 tablets (12.5 mg total) by mouth daily. 45 tablet 0  . rosuvastatin (CRESTOR) 5 MG tablet TAKE ONE TABLET BY MOUTH AT BEDTIME 90 tablet 2  . sitaGLIPtin (JANUVIA) 50 MG tablet Take 1 tablet (50 mg total) by mouth daily. 90 tablet 4  . warfarin (COUMADIN) 7.5 MG tablet Take as directed by anticoagulation clinic provider. Take 1/2 tablet on Sunday, Tues, Thurs and Saturday.  Take 1 tablet on Mon, Wed, Friday. 30 tablet 2   No current facility-administered medications for this visit.   Family History  Problem Relation Age of Onset  . Heart disease Mother   . Diabetes Mother   . Hyperlipidemia Mother   . Hypertension Mother   . Heart disease Father   . Diabetes Father   . Hyperlipidemia Father   . Hypertension Father    History   Social History  . Marital Status: Widowed    Spouse Name: N/A    Number of Children: N/A  . Years of Education: N/A   Social History Main Topics  . Smoking status: Never Smoker   . Smokeless tobacco: Never Used  . Alcohol Use: No  . Drug Use: No  . Sexual Activity: None   Other Topics Concern  . None   Social History Narrative    Patient is a widow. She has 11 children 5 of who are living. She is retired in 1993 from [employer]. She denies tobacco alcohol or drug use.   Review of Systems: Review of Systems  Constitutional: Negative for fever, chills and weight loss.  HENT: Negative for congestion.   Eyes: Positive for blurred vision (chronic).  Respiratory: Negative for cough, shortness of breath and wheezing.   Cardiovascular: Negative for chest pain, palpitations and leg swelling.  Gastrointestinal: Negative for nausea, vomiting, diarrhea and blood in stool.  Genitourinary: Negative for dysuria, urgency, frequency and hematuria.    Musculoskeletal: Positive for joint pain (left knee for past 2 days). Negative for myalgias, back pain and falls.  Neurological: Negative for dizziness, sensory change, focal weakness and headaches.  Endo/Heme/Allergies: Negative for polydipsia. Does not bruise/bleed easily.    Objective:  Physical Exam: Filed Vitals:   08/22/14 1343  BP: 155/49  Pulse: 69  Temp: 97.4 F (36.3 C)  TempSrc: Oral  Height: 5\' 2"  (1.575 m)  SpO2: 100%   Physical Exam  Constitutional: She is oriented to person, place, and time. She appears well-developed and well-nourished. No distress.  HENT:  Head: Normocephalic and atraumatic.  Eyes: EOM are normal.  Neck: Normal range of motion. Neck supple.  Cardiovascular: Normal rate and regular rhythm.   Pulmonary/Chest: Effort normal  and breath sounds normal. No respiratory distress. She has no wheezes. She has no rales.  Abdominal: Soft. Bowel sounds are normal. She exhibits no distension. There is no tenderness. There is no rebound.  Musculoskeletal: She exhibits tenderness (anterior left knee (worse at mediial side)). She exhibits no edema.  Reduced ROM of left knee with no erythema, warmth, or swelling. No instability.   Neurological: She is alert and oriented to person, place, and time.  Normal 5/5 muscle strength and normal sensation to light touch.   Skin: Skin is warm and dry. No rash noted. She is not diaphoretic. No erythema. No pallor.  Psychiatric: She has a normal mood and affect. Her behavior is normal. Judgment normal.    Assessment & Plan:   Please see problem list for problem-based assessment and plan

## 2014-08-23 DIAGNOSIS — E119 Type 2 diabetes mellitus without complications: Secondary | ICD-10-CM | POA: Diagnosis not present

## 2014-08-23 LAB — VITAMIN D 25 HYDROXY (VIT D DEFICIENCY, FRACTURES): Vit D, 25-Hydroxy: 27 ng/mL — ABNORMAL LOW (ref 30–100)

## 2014-08-23 MED ORDER — VITAMIN D 50 MCG (2000 UT) PO TABS: 2000.0000 [IU] | ORAL_TABLET | Freq: Every day | ORAL | Status: DC

## 2014-08-24 DIAGNOSIS — M25562 Pain in left knee: Secondary | ICD-10-CM | POA: Insufficient documentation

## 2014-08-24 DIAGNOSIS — E559 Vitamin D deficiency, unspecified: Secondary | ICD-10-CM | POA: Insufficient documentation

## 2014-08-24 NOTE — Assessment & Plan Note (Addendum)
Assessment: Pt with history of non-tophaceous gout and chronic b/l knee pain due to probable osteoarthritis with 2-day history of acute left knee pain without preceding injury/fall/trauma and no signs of infection/inflammation most likely due to patellofemoral pain syndrome vs  worsening of knee osteoarthritis vs medial meniscus tear.      Plan: -Pt declines xray imaging and sports medicine referral at this time -Pt instructed to take OTC acetaminophen 650 mg Q 6 hr PRN pain (avoid NSAIDs in setting of CKD Stage 3 and aspirin in setting of history of gout) -Pt instructed to continue using walker, limit weight bearing, and wear ace strap on knee -Pt instructed to return if symptoms worsen or do not improve

## 2014-08-24 NOTE — Assessment & Plan Note (Signed)
Assessment: Pt with last lipid panel on 10/04/12 that was normal compliant with moderate-intensity statin therapy with no 10-yr calculated ASCVD risk due to age >82.   Plan:  -Obtain annual lipid panel ---> normal -Continue rosuvastatin 5 mg daily  -Obtain CMP---> normal liver function -Continue to monitor for myalgias

## 2014-08-24 NOTE — Assessment & Plan Note (Signed)
Assessment: Pt is s/p hysterectomy with CKD Stage 3 on chronic AC therapy with no prior screening colonoscopy with chronic normocytic anemia with baseline Hg 11 with no prior anemia panel who presents with no active bleeding or hemodynamic instability.    Plan: -Obtain CBC w/diff ---> Hg 10 below baseline 11 -Obtain anemia panel ----> consistent with ACD due to CKD Stage 3, no indication for iron replacement   -Pt follows with Dr. Elie Confer for coumadin management -Pt declines screening colonoscopy (pt is 81 yo old)

## 2014-08-24 NOTE — Assessment & Plan Note (Addendum)
Assessment: Pt with CKD Stage 3 and associated ACD on ACEi therapy for proteinuria who presents with stable renal function and no oliguria.    Plan:  -Obtain CMP ---> Cr 1.74 with GFR 31 -Last microalbumin on 07/29/11 with 195.2 mg of protein, consider repeating at next visit  -Continue benazepril 40 mg daily for proteinuria -Obtain 25-OH vitamin D level ---->  27 (vitamin D insufficiency), start vitamin D 2000 U daily and obtain DEXA scan if pt agreeable at next visit

## 2014-08-24 NOTE — Assessment & Plan Note (Signed)
Assessment: Pt with moderately well-controlled hypertension compliant with four-class (BB, ACEi, diuretic, & CCB) anti-hypertensive therapy who presents with blood pressure of 155/49.  Plan:  -BP 155/49 not at goal <140/90 -Continue amlodipine 10 mg daily, benazepril 40 mg daily, metoprolol succinate 12.5 mg daily, and furosemide 40 mg in AM and 20 mg in PM -Obtain CMP ---> stable CKD Stage 3 with Cr 1.74 and GFR 31

## 2014-08-26 DIAGNOSIS — M109 Gout, unspecified: Secondary | ICD-10-CM | POA: Diagnosis not present

## 2014-08-26 NOTE — Addendum Note (Signed)
Addended by: Truddie Crumble on: 08/26/2014 10:42 AM   Modules accepted: Orders

## 2014-08-27 DIAGNOSIS — M109 Gout, unspecified: Secondary | ICD-10-CM | POA: Diagnosis not present

## 2014-08-28 DIAGNOSIS — M109 Gout, unspecified: Secondary | ICD-10-CM | POA: Diagnosis not present

## 2014-08-29 DIAGNOSIS — M109 Gout, unspecified: Secondary | ICD-10-CM | POA: Diagnosis not present

## 2014-08-30 DIAGNOSIS — M109 Gout, unspecified: Secondary | ICD-10-CM | POA: Diagnosis not present

## 2014-08-31 NOTE — Progress Notes (Signed)
Case discussed with Dr. Rabbani soon after the resident saw the patient.  We reviewed the resident's history and exam and pertinent patient test results.  I agree with the assessment, diagnosis, and plan of care documented in the resident's note. 

## 2014-09-02 ENCOUNTER — Ambulatory Visit (INDEPENDENT_AMBULATORY_CARE_PROVIDER_SITE_OTHER): Payer: Medicare Other | Admitting: Pharmacist

## 2014-09-02 DIAGNOSIS — M109 Gout, unspecified: Secondary | ICD-10-CM | POA: Diagnosis not present

## 2014-09-02 DIAGNOSIS — Z7901 Long term (current) use of anticoagulants: Secondary | ICD-10-CM

## 2014-09-02 LAB — POCT INR: INR: 3

## 2014-09-02 NOTE — Progress Notes (Signed)
Anti-Coagulation Progress Note  Chelsea Mcdaniel is a 81 y.o. female who is currently on an anti-coagulation regimen.    RECENT RESULTS: Recent results are below, the most recent result is correlated with a dose of 41.25 mg. per week: Lab Results  Component Value Date   INR 3.0 09/02/2014   INR 1.30 07/08/2014   INR 2.00 06/17/2014    ANTI-COAG DOSE: Anticoagulation Dose Instructions as of 09/02/2014      [name] Tue Wed Thu Fri Sat   New Dose 3.75 mg 7.5 mg 7.5 mg 7.5 mg 3.75 mg 7.5 mg 3.75 mg       ANTICOAG SUMMARY: Anticoagulation Episode Summary    Current INR goal    Next INR check 09/23/2014   INR from last check 3.0 (09/02/2014)   Weekly max dose    Target end date    INR check location    Preferred lab    Send INR reminders to       Comments         ANTICOAG TODAY: Anticoagulation Summary as of 09/02/2014    INR goal    Selected INR 3.0 (09/02/2014)   Next INR check 09/23/2014   Target end date     Anticoagulation Episode Summary    INR check location    Preferred lab    Send INR reminders to    Comments       PATIENT INSTRUCTIONS: Patient Instructions  Patient instructed to take medications as defined in the Anti-coagulation Track section of this encounter.  Patient instructed to take today's dose.  Patient verbalized understanding of these instructions.       FOLLOW-UP Return in 3 weeks (on 09/23/2014) for Follow up INR at 3:15pm.  [name], III Pharm.D., CACP

## 2014-09-02 NOTE — Patient Instructions (Signed)
Patient instructed to take medications as defined in the Anti-coagulation Track section of this encounter.  Patient instructed to take today's dose.  Patient verbalized understanding of these instructions.    

## 2014-09-03 DIAGNOSIS — M109 Gout, unspecified: Secondary | ICD-10-CM | POA: Diagnosis not present

## 2014-09-04 ENCOUNTER — Other Ambulatory Visit: Payer: Self-pay | Admitting: Internal Medicine

## 2014-09-04 DIAGNOSIS — M109 Gout, unspecified: Secondary | ICD-10-CM | POA: Diagnosis not present

## 2014-09-05 DIAGNOSIS — M109 Gout, unspecified: Secondary | ICD-10-CM | POA: Diagnosis not present

## 2014-09-06 DIAGNOSIS — M109 Gout, unspecified: Secondary | ICD-10-CM | POA: Diagnosis not present

## 2014-09-09 DIAGNOSIS — M109 Gout, unspecified: Secondary | ICD-10-CM | POA: Diagnosis not present

## 2014-09-10 DIAGNOSIS — M109 Gout, unspecified: Secondary | ICD-10-CM | POA: Diagnosis not present

## 2014-09-11 DIAGNOSIS — M109 Gout, unspecified: Secondary | ICD-10-CM | POA: Diagnosis not present

## 2014-09-12 DIAGNOSIS — M109 Gout, unspecified: Secondary | ICD-10-CM | POA: Diagnosis not present

## 2014-09-13 DIAGNOSIS — M109 Gout, unspecified: Secondary | ICD-10-CM | POA: Diagnosis not present

## 2014-09-16 DIAGNOSIS — M109 Gout, unspecified: Secondary | ICD-10-CM | POA: Diagnosis not present

## 2014-09-17 DIAGNOSIS — M109 Gout, unspecified: Secondary | ICD-10-CM | POA: Diagnosis not present

## 2014-09-18 DIAGNOSIS — M109 Gout, unspecified: Secondary | ICD-10-CM | POA: Diagnosis not present

## 2014-09-19 DIAGNOSIS — M109 Gout, unspecified: Secondary | ICD-10-CM | POA: Diagnosis not present

## 2014-09-20 DIAGNOSIS — M109 Gout, unspecified: Secondary | ICD-10-CM | POA: Diagnosis not present

## 2014-09-23 ENCOUNTER — Ambulatory Visit (INDEPENDENT_AMBULATORY_CARE_PROVIDER_SITE_OTHER): Payer: Medicare Other | Admitting: Pharmacist

## 2014-09-23 DIAGNOSIS — Z7901 Long term (current) use of anticoagulants: Secondary | ICD-10-CM | POA: Diagnosis not present

## 2014-09-23 DIAGNOSIS — M109 Gout, unspecified: Secondary | ICD-10-CM | POA: Diagnosis not present

## 2014-09-23 LAB — POCT INR: INR: 3.2

## 2014-09-23 NOTE — Patient Instructions (Signed)
Patient instructed to take medications as defined in the Anti-coagulation Track section of this encounter.  Patient instructed to take today's dose.  Patient verbalized understanding of these instructions.    

## 2014-09-23 NOTE — Progress Notes (Signed)
Anti-Coagulation Progress Note  Chelsea Mcdaniel is a 81 y.o. female who is currently on an anti-coagulation regimen.  RECENT RESULTS:  Recent results are below, the most recent result is correlated with a dose of 45 mg. per week:  Lab Results  Component Value Date   INR 3.2 09/23/2014   INR 3.0 09/02/2014   INR 1.30 07/08/2014    ANTI-COAG DOSE:  Anticoagulation Dose Instructions as of 09/23/2014      Sun Mon Tue Wed Thu Fri Sat   New Dose 7.5 mg 3.75 mg 7.5 mg 3.75 mg 7.5 mg 3.75 mg 7.5 mg      ANTICOAG SUMMARY:  Anticoagulation Episode Summary    Current INR goal    Next INR check 10/14/2014   INR from last check 3.2 (09/23/2014)   Weekly max dose    Target end date    INR check location    Preferred lab    Send INR reminders to       Comments         ANTICOAG TODAY:  Anticoagulation Summary as of 09/23/2014    INR goal    Selected INR 3.2 (09/23/2014)   Next INR check 10/14/2014   Target end date     Anticoagulation Episode Summary    INR check location    Preferred lab    Send INR reminders to    Comments       PATIENT INSTRUCTIONS:  Patient Instructions  Patient instructed to take medications as defined in the Anti-coagulation Track section of this encounter.  Patient instructed to take today's dose.  Patient verbalized understanding of these instructions.        FOLLOW-UP  Return in 3 weeks (on 10/14/2014) for Follow up INR at 3:30pm.     Thank you for allowing pharmacy to be part of this patient's care team  [place], Pharm.D Clinical Pharmacy Resident Pager: [phone] 09/23/2014 .3:58 PM

## 2014-09-24 DIAGNOSIS — M109 Gout, unspecified: Secondary | ICD-10-CM | POA: Diagnosis not present

## 2014-09-25 ENCOUNTER — Ambulatory Visit (INDEPENDENT_AMBULATORY_CARE_PROVIDER_SITE_OTHER): Payer: Medicare Other | Admitting: Internal Medicine

## 2014-09-25 ENCOUNTER — Encounter: Payer: Self-pay | Admitting: Internal Medicine

## 2014-09-25 VITALS — BP 133/58 | HR 68 | Temp 97.5°F | Ht 62.0 in | Wt 157.3 lb

## 2014-09-25 DIAGNOSIS — E119 Type 2 diabetes mellitus without complications: Secondary | ICD-10-CM

## 2014-09-25 DIAGNOSIS — Z0000 Encounter for general adult medical examination without abnormal findings: Secondary | ICD-10-CM

## 2014-09-25 DIAGNOSIS — I10 Essential (primary) hypertension: Secondary | ICD-10-CM | POA: Diagnosis not present

## 2014-09-25 DIAGNOSIS — M109 Gout, unspecified: Secondary | ICD-10-CM | POA: Diagnosis not present

## 2014-09-25 DIAGNOSIS — N189 Chronic kidney disease, unspecified: Secondary | ICD-10-CM

## 2014-09-25 DIAGNOSIS — E1122 Type 2 diabetes mellitus with diabetic chronic kidney disease: Secondary | ICD-10-CM

## 2014-09-25 DIAGNOSIS — Z780 Asymptomatic menopausal state: Secondary | ICD-10-CM | POA: Diagnosis not present

## 2014-09-25 LAB — POCT GLYCOSYLATED HEMOGLOBIN (HGB A1C): HEMOGLOBIN A1C: 6.9

## 2014-09-25 LAB — GLUCOSE, CAPILLARY: GLUCOSE-CAPILLARY: 263 mg/dL — AB (ref 70–99)

## 2014-09-25 MED ORDER — AMLODIPINE BESYLATE 10 MG PO TABS: 10.0000 mg | ORAL_TABLET | Freq: Every day | ORAL | Status: DC

## 2014-09-25 MED ORDER — GLIPIZIDE ER 10 MG PO TB24: ORAL_TABLET | ORAL | Status: DC

## 2014-09-25 MED ORDER — SITAGLIPTIN PHOSPHATE 50 MG PO TABS: 50.0000 mg | ORAL_TABLET | Freq: Every day | ORAL | Status: DC

## 2014-09-25 NOTE — Assessment & Plan Note (Signed)
She declines flu, shingles vaccines.   DEXA:  Referral placed today Ophthalmology:  She has an appt this month.

## 2014-09-25 NOTE — Assessment & Plan Note (Addendum)
BP Readings from Last 3 Encounters:  09/25/14 133/58  08/22/14 155/49  06/12/14 143/55    Lab Results  Component Value Date   NA 137 08/22/2014   K 4.7 08/22/2014   CREATININE 1.74* 08/22/2014    Assessment: Blood pressure control:  well controlled Progress toward BP goal:   at goal Comments: Patient's BP log reviewed for past few months.  Most values normotensive and reflect excellent control (avg 120-130 SBP, 60-70 DBP with a few moderately elevated values).  I have instructed her to take her time when going from sitting to standing because abrupt position changes may cause her lightheadedness that she occasionally experiences.  She is not lightheaded today.  Plan: Medications:  continue current medications:  Amlodipine 10mg  daily, benazepril 40mg  daily, metoprolol 12.5mg  daily, Lasix 40mg  qAM and 20mg  qPM Educational resources provided: brochure (denies) Other plans: RTC in 3 months for follow-up.

## 2014-09-25 NOTE — Progress Notes (Signed)
Internal Medicine Clinic Attending  Case discussed with Dr. Wilson soon after the resident saw the patient.  We reviewed the resident's history and exam and pertinent patient test results.  I agree with the assessment, diagnosis, and plan of care documented in the resident's note.  

## 2014-09-25 NOTE — Assessment & Plan Note (Addendum)
Lab Results  Component Value Date   HGBA1C 6.6 06/12/2014   HGBA1C 6.8 02/27/2014   HGBA1C 6.9 12/13/2013     Assessment: Diabetes control:  well controlled   Progress toward A1C goal:   at goal Comments: Compliant with medications.  She has brought her pill bottles and her pill box (her daughter in-law fills it for her weekly).  Her log book has no CBGs below 100, most are 100-200 at breakfast time.  She occasionally experiences lightheadedness that responds to a meal but she does not check her CBG at these times.  She only checks her CBG when her aide comes in the morning because she has difficulty using the machine on her own.  I have asked her to try and check it the next time she feels this way.  She will continue to eat a snack since it helps her feel better.  Neuro exam unremarkable.  If lightheadedness becomes more frequent or persists, I have asked her to come back to see me sooner.  Plan: Medications:  continue current medications:  Glipizide 10mg  daily and januvia 50mg  daily (CrCl > 30 but will keep a close eye on it; if it drops below 30, will need to decrease dose to 25mg ). Home glucose monitoring:  1-2x daily Frequency:   Timing:   Instruction/counseling given: reminded to bring medications to each visit Educational resources provided: brochure (denies) Self management tools provided: home glucose logbook Other plans: RTC in 3 months for follow-up and BMP to check renal function.

## 2014-09-25 NOTE — Patient Instructions (Signed)
General Instructions: 1. I will refer you for DEXA scan to see how strong your bones are.  Someone will call you with the appointment time.     2. Please take all medications as prescribed.  Try and check your blood sugar at least once per day and when you feel your sugar may be low.    3. If you have worsening of your symptoms or new symptoms arise, please call the clinic [phone]), or go to the ER immediately if symptoms are severe.  Please return to see me in 3 months or sooner if you have problems.    Treatment Goals:  Goals (1 Years of Data) as of 09/25/14          As of Today 08/22/14 06/12/14 04/25/14 02/27/14     Blood Pressure   . Blood Pressure < 140/90  133/58 155/49 143/55 154/69 135/67     Result Component   . HEMOGLOBIN A1C < 8.0  6.9  6.6  6.8   . LDL CALC < 100   64         Progress Toward Treatment Goals:  Treatment Goal 09/25/2014  Hemoglobin A1C at goal  Blood pressure improved    Self Care Goals & Plans:  Self Care Goal 09/25/2014  Manage my medications take my medicines as prescribed; bring my medications to every visit; refill my medications on time  Monitor my health -  Eat healthy foods drink diet soda or water instead of juice or soda; eat more vegetables; eat foods that are low in salt; eat baked foods instead of fried foods; eat fruit for snacks and desserts  Be physically active -  Meeting treatment goals -    Home Blood Glucose Monitoring 09/25/2014  Check my blood sugar 2 times a day  When to check my blood sugar before breakfast; at bedtime     Care Management & Community Referrals:  No flowsheet data found.     Hypoglycemia Hypoglycemia occurs when the glucose in your blood is too low. Glucose is a type of sugar that is your body's main energy source. Hormones, such as insulin and glucagon, control the level of glucose in the blood. Insulin lowers blood glucose and glucagon increases blood glucose. Having too much insulin in your blood  stream, or not eating enough food containing sugar, can result in hypoglycemia. Hypoglycemia can happen to people with or without diabetes. It can develop quickly and can be a medical emergency.  CAUSES   Missing or delaying meals.  Not eating enough carbohydrates at meals.  Taking too much diabetes medicine.  Not timing your oral diabetes medicine or insulin doses with meals, snacks, and exercise.  Nausea and vomiting.  Certain medicines.  Severe illnesses, such as hepatitis, kidney disorders, and certain eating disorders.  Increased activity or exercise without eating something extra or adjusting medicines.  Drinking too much alcohol.  A nerve disorder that affects body functions like your heart rate, blood pressure, and digestion (autonomic neuropathy).  A condition where the stomach muscles do not function properly (gastroparesis). Therefore, medicines and food may not absorb properly.  Rarely, a tumor of the pancreas can produce too much insulin. SYMPTOMS   Hunger.  Sweating (diaphoresis).  Change in body temperature.  Shakiness.  Headache.  Anxiety.  Lightheadedness.  Irritability.  Difficulty concentrating.  Dry mouth.  Tingling or numbness in the hands or feet.  Restless sleep or sleep disturbances.  Altered speech and coordination.  Change in mental status.  Seizures or prolonged convulsions.  Combativeness.  Drowsiness (lethargic).  Weakness.  Increased heart rate or palpitations.  Confusion.  Pale, gray skin color.  Blurred or double vision.  Fainting. DIAGNOSIS  A physical exam and medical history will be performed. Your caregiver may make a diagnosis based on your symptoms. Blood tests and other lab tests may be performed to confirm a diagnosis. Once the diagnosis is made, your caregiver will see if your signs and symptoms go away once your blood glucose is raised.  TREATMENT  Usually, you can easily treat your hypoglycemia when  you notice symptoms.  Check your blood glucose. If it is less than 70 mg/dl, take one of the following:   3-4 glucose tablets.    cup juice.    cup regular soda.   1 cup skim milk.   -1 tube of glucose gel.   5-6 hard candies.   Avoid high-fat drinks or food that may delay a rise in blood glucose levels.  Do not take more than the recommended amount of sugary foods, drinks, gel, or tablets. Doing so will cause your blood glucose to go too high.   Wait 10-15 minutes and recheck your blood glucose. If it is still less than 70 mg/dl or below your target range, repeat treatment.   Eat a snack if it is more than 1 hour until your next meal.  There may be a time when your blood glucose may go so low that you are unable to treat yourself at home when you start to notice symptoms. You may need someone to help you. You may even faint or be unable to swallow. If you cannot treat yourself, someone will need to bring you to the hospital.  [place]  If you have diabetes, follow your diabetes management plan by:  Taking your medicines as directed.  Following your exercise plan.  Following your meal plan. Do not skip meals. Eat on time.  Testing your blood glucose regularly. Check your blood glucose before and after exercise. If you exercise longer or different than usual, be sure to check blood glucose more frequently.  Wearing your medical alert jewelry that says you have diabetes.  Identify the cause of your hypoglycemia. Then, develop ways to prevent the recurrence of hypoglycemia.  Do not take a hot bath or shower right after an insulin shot.  Always carry treatment with you. Glucose tablets are the easiest to carry.  If you are going to drink alcohol, drink it only with meals.  Tell friends or family members ways to keep you safe during a seizure. This may include removing hard or sharp objects from the area or turning you on your side.  Maintain a  healthy weight. SEEK MEDICAL CARE IF:   You are having problems keeping your blood glucose in your target range.  You are having frequent episodes of hypoglycemia.  You feel you might be having side effects from your medicines.  You are not sure why your blood glucose is dropping so low.  You notice a change in vision or a new problem with your vision. SEEK IMMEDIATE MEDICAL CARE IF:   Confusion develops.  A change in mental status occurs.  The inability to swallow develops.  Fainting occurs. Document Released: 07/12/2005 Document Revised: 07/17/2013 Document Reviewed: 11/08/2011 [facility] Patient Information 2015 [place], [place]. This information is not intended to replace advice given to you by your health care provider. Make sure you discuss any questions you have with your health  care provider.   

## 2014-09-25 NOTE — Progress Notes (Signed)
   Subjective:    Patient ID: Chelsea Mcdaniel, female    DOB: Oct 27, 1927, 81 y.o.   MRN: [id number]  HPI Comments: [name] is an 81 year old with PMH of HTN, hx of DVT and CVAs (one while on ASA/plavix, now on chronic coumadin), DM (HgbA1c 6.31 May 2014), OA, CKD3, AoCD and gout here for routine check-up. Please see problem based assessment and plan for update of chronic conditions.      Review of Systems  Constitutional: Negative for fever, activity change and appetite change.  Eyes: Negative for visual disturbance.       Chronic left eye blindness but no new deficits.  Respiratory: Negative for cough and shortness of breath.   Cardiovascular: Negative for chest pain, palpitations and leg swelling.  Gastrointestinal: Negative for nausea, vomiting, abdominal pain, diarrhea, constipation and blood in stool.  Genitourinary: Negative for dysuria and hematuria.  Neurological: Positive for light-headedness. Negative for syncope and weakness.       Occasional lightheadedness, resolves when she eats a snack, sometimes positional from sitting to standing  Psychiatric/Behavioral: Negative for dysphoric mood.       Filed Vitals:   09/25/14 1323  BP: 133/58  Pulse: 68  Temp: 97.5 F (36.4 C)  TempSrc: Oral  Height: 5\' 2"  (1.575 m)  Weight: 157 lb 4.8 oz (71.351 kg)  SpO2: 100%   Objective:   Physical Exam  Constitutional: She is oriented to person, place, and time. She appears well-developed. No distress.  HENT:  Head: Normocephalic and atraumatic.  Mouth/Throat: Oropharynx is clear and moist. No oropharyngeal exudate.  Eyes: EOM are normal. No scleral icterus.  Right eye PERRL, left eye with post-op changes and unresponsive (unchanged from prior exams)  Neck: Neck supple.  Cardiovascular: Normal rate, regular rhythm, normal heart sounds and intact distal pulses.  Exam reveals no gallop and no friction rub.   No murmur heard. Pulmonary/Chest: Effort normal and breath sounds normal.  No respiratory distress. She has no wheezes. She has no rales.  Abdominal: Soft. Bowel sounds are normal. She exhibits no distension. There is no tenderness. There is no rebound.  Musculoskeletal: Normal range of motion. She exhibits no edema or tenderness.  Neurological: She is alert and oriented to person, place, and time. No cranial nerve deficit.  She is able to ambulate unassisted without difficulty.     Skin: Skin is warm. She is not diaphoretic.  She is wearing sheer stockings and there are no obvious ulcers or wounds on the feet; skin appears intact.  Psychiatric: She has a normal mood and affect. Her behavior is normal.  Vitals reviewed.         Assessment & Plan:  Please see problem based assessment and plan.

## 2014-09-26 ENCOUNTER — Telehealth: Payer: Self-pay | Admitting: *Deleted

## 2014-09-26 DIAGNOSIS — M109 Gout, unspecified: Secondary | ICD-10-CM | POA: Diagnosis not present

## 2014-09-26 NOTE — Telephone Encounter (Signed)
Call to patient to notify her about her Dexa Scan appointment on 10/02/2014 at 1:00 PM to arriver by 12:45 PM at the [facility].  Patient had family member with her to get information about the appointment.  Patient was asked not to take any Calcium or vitamins the day before the Scan and not until after the Scan is completed.  Patient and family member voiced understanding of the plan.  Sander Nephew, RN 09/26/2014 9:29 AM

## 2014-09-27 DIAGNOSIS — M109 Gout, unspecified: Secondary | ICD-10-CM | POA: Diagnosis not present

## 2014-09-30 DIAGNOSIS — M109 Gout, unspecified: Secondary | ICD-10-CM | POA: Diagnosis not present

## 2014-10-01 DIAGNOSIS — M109 Gout, unspecified: Secondary | ICD-10-CM | POA: Diagnosis not present

## 2014-10-02 ENCOUNTER — Ambulatory Visit
Admission: RE | Admit: 2014-10-02 | Discharge: 2014-10-02 | Disposition: A | Discharge status: Home / Self Care | Payer: Medicare Other | Source: Ambulatory Visit | Attending: Internal Medicine | Admitting: Internal Medicine

## 2014-10-02 DIAGNOSIS — Z780 Asymptomatic menopausal state: Secondary | ICD-10-CM

## 2014-10-02 DIAGNOSIS — M810 Age-related osteoporosis without current pathological fracture: Secondary | ICD-10-CM | POA: Diagnosis not present

## 2014-10-02 DIAGNOSIS — M109 Gout, unspecified: Secondary | ICD-10-CM | POA: Diagnosis not present

## 2014-10-03 DIAGNOSIS — H338 Other retinal detachments: Secondary | ICD-10-CM | POA: Diagnosis not present

## 2014-10-03 DIAGNOSIS — M109 Gout, unspecified: Secondary | ICD-10-CM | POA: Diagnosis not present

## 2014-10-03 DIAGNOSIS — H2702 Aphakia, left eye: Secondary | ICD-10-CM | POA: Diagnosis not present

## 2014-10-03 DIAGNOSIS — H4011X4 Primary open-angle glaucoma, indeterminate stage: Secondary | ICD-10-CM | POA: Diagnosis not present

## 2014-10-03 DIAGNOSIS — H2511 Age-related nuclear cataract, right eye: Secondary | ICD-10-CM | POA: Diagnosis not present

## 2014-10-03 LAB — HM DIABETES EYE EXAM

## 2014-10-04 DIAGNOSIS — M109 Gout, unspecified: Secondary | ICD-10-CM | POA: Diagnosis not present

## 2014-10-07 DIAGNOSIS — M109 Gout, unspecified: Secondary | ICD-10-CM | POA: Diagnosis not present

## 2014-10-08 DIAGNOSIS — M109 Gout, unspecified: Secondary | ICD-10-CM | POA: Diagnosis not present

## 2014-10-09 DIAGNOSIS — M109 Gout, unspecified: Secondary | ICD-10-CM | POA: Diagnosis not present

## 2014-10-10 DIAGNOSIS — M109 Gout, unspecified: Secondary | ICD-10-CM | POA: Diagnosis not present

## 2014-10-11 ENCOUNTER — Other Ambulatory Visit: Payer: Self-pay | Admitting: Internal Medicine

## 2014-10-11 DIAGNOSIS — M109 Gout, unspecified: Secondary | ICD-10-CM | POA: Diagnosis not present

## 2014-10-11 DIAGNOSIS — M8580 Other specified disorders of bone density and structure, unspecified site: Secondary | ICD-10-CM

## 2014-10-14 ENCOUNTER — Other Ambulatory Visit (INDEPENDENT_AMBULATORY_CARE_PROVIDER_SITE_OTHER): Payer: Medicare Other

## 2014-10-14 ENCOUNTER — Telehealth: Payer: Self-pay | Admitting: *Deleted

## 2014-10-14 ENCOUNTER — Ambulatory Visit (INDEPENDENT_AMBULATORY_CARE_PROVIDER_SITE_OTHER): Payer: Medicare Other | Admitting: Pharmacist

## 2014-10-14 DIAGNOSIS — Z7901 Long term (current) use of anticoagulants: Secondary | ICD-10-CM

## 2014-10-14 DIAGNOSIS — Z8673 Personal history of transient ischemic attack (TIA), and cerebral infarction without residual deficits: Secondary | ICD-10-CM

## 2014-10-14 DIAGNOSIS — M8580 Other specified disorders of bone density and structure, unspecified site: Secondary | ICD-10-CM

## 2014-10-14 DIAGNOSIS — M109 Gout, unspecified: Secondary | ICD-10-CM | POA: Diagnosis not present

## 2014-10-14 DIAGNOSIS — Z86718 Personal history of other venous thrombosis and embolism: Secondary | ICD-10-CM | POA: Diagnosis not present

## 2014-10-14 DIAGNOSIS — N183 Chronic kidney disease, stage 3 (moderate): Secondary | ICD-10-CM | POA: Diagnosis not present

## 2014-10-14 LAB — BASIC METABOLIC PANEL WITH GFR
BUN: 21 mg/dL (ref 6–23)
CALCIUM: 9 mg/dL (ref 8.4–10.5)
CO2: 22 meq/L (ref 19–32)
Chloride: 103 mEq/L (ref 96–112)
Creat: 1.32 mg/dL — ABNORMAL HIGH (ref 0.50–1.10)
GFR, Est African American: 43 mL/min — ABNORMAL LOW
GFR, Est Non African American: 37 mL/min — ABNORMAL LOW
GLUCOSE: 230 mg/dL — AB (ref 70–99)
Potassium: 4.1 mEq/L (ref 3.5–5.3)
Sodium: 137 mEq/L (ref 135–145)

## 2014-10-14 LAB — POCT INR: INR: 1.8

## 2014-10-14 LAB — PHOSPHORUS: PHOSPHORUS: 3 mg/dL (ref 2.3–4.6)

## 2014-10-14 MED ORDER — WARFARIN SODIUM 5 MG PO TABS
ORAL_TABLET | ORAL | Status: DC
Start: 2014-10-14 — End: 2015-03-25

## 2014-10-14 NOTE — Telephone Encounter (Signed)
Call to patient to inform her of the need for lab work .  Her Dexa Scan showed Bone weakness and Dr. Redmond Pulling would like for her to get lab work and to return for an appointment the first week of April.  Fraser Din would like to speak  with her daughter-in-law first about transportation and will call tomorrow to schedule labs and an appointment for follow up.  Patient voiced understanding of the plan.  Sander Nephew, RN 10/14/2014 2:06 PM

## 2014-10-14 NOTE — Progress Notes (Signed)
CLINICAL PHARMACY ANTICOAGULATION MANAGEMENT Chelsea Mcdaniel is a 81 y.o. female who reports to the clinic for monitoring of warfarin treatment.    Indication: CVA and DVT Duration: indefinite  Anticoagulation Clinic Visit History: Anticoagulation Episode Summary    Current INR goal    Next INR check 11/04/2014   INR from last check 1.8 (10/14/2014)   Weekly max dose    Target end date    INR check location    Preferred lab    Send INR reminders to       Comments        ASSESSMENT Recent Results: Recent results are below, the most recent result is correlated with a dose of 41.25 mg per week: Lab Results  Component Value Date   INR 1.8 10/14/2014   INR 3.2 09/23/2014   INR 3.0 09/02/2014    INR today: Subtherapeutic  Anticoagulation Dosing: INR as of 10/14/2014 and Previous Dosing Information    INR Dt INR Goal [employer] Sun Mon Tue Wed Thu Fri Sat   10/14/2014 1.8 - 41.25 mg 7.5 mg 3.75 mg 7.5 mg 3.75 mg 7.5 mg 3.75 mg 7.5 mg    Anticoagulation Dose Instructions as of 10/14/2014      Total Sun Mon Tue Wed Thu Fri Sat   New Dose 42.5 mg 5 mg 7.5 mg 5 mg 7.5 mg 5 mg 7.5 mg 5 mg     (5 mg x 1)  (5 mg x 1.5)  (5 mg x 1)  (5 mg x 1.5)  (5 mg x 1)  (5 mg x 1.5)  (5 mg x 1)                         Description        Changed tablet strength for improved flexibility with dose titration      PLAN Weekly dose was increased by 3% to 42.5 mg per week  Patient Instructions  Patient educated about medication as defined in this encounter and verbalized understanding by repeating back instructions provided.     Follow-up Return in about 3 weeks (around 11/04/2014) for 11/04/14 Follow up INR 2 pm.  Flossie Dibble, PharmD BCPS, BCACP

## 2014-10-14 NOTE — Patient Instructions (Signed)
Patient educated about medication as defined in this encounter and verbalized understanding by repeating back instructions provided.   

## 2014-10-15 ENCOUNTER — Other Ambulatory Visit: Payer: Self-pay | Admitting: Internal Medicine

## 2014-10-15 DIAGNOSIS — M1A30X0 Chronic gout due to renal impairment, unspecified site, without tophus (tophi): Secondary | ICD-10-CM

## 2014-10-15 DIAGNOSIS — M109 Gout, unspecified: Secondary | ICD-10-CM | POA: Diagnosis not present

## 2014-10-15 LAB — PTH, INTACT AND CALCIUM
CALCIUM TOTAL (PTH): 8.8 mg/dL (ref 8.7–10.3)
PTH: 209 pg/mL — AB (ref 15–65)

## 2014-10-15 LAB — VITAMIN D 25 HYDROXY (VIT D DEFICIENCY, FRACTURES): Vit D, 25-Hydroxy: 29 ng/mL — ABNORMAL LOW (ref 30–100)

## 2014-10-15 NOTE — Progress Notes (Signed)
Indication: Previous DVT and CVAs while on plavix and ASA. Duration: As long as benefits outweigh risks. INR: Below target. Agree with Dr. Julianne Rice assessment and plan.

## 2014-10-16 DIAGNOSIS — M109 Gout, unspecified: Secondary | ICD-10-CM | POA: Diagnosis not present

## 2014-10-17 DIAGNOSIS — M109 Gout, unspecified: Secondary | ICD-10-CM | POA: Diagnosis not present

## 2014-10-18 DIAGNOSIS — M109 Gout, unspecified: Secondary | ICD-10-CM | POA: Diagnosis not present

## 2014-10-21 ENCOUNTER — Other Ambulatory Visit: Payer: Self-pay | Admitting: *Deleted

## 2014-10-21 DIAGNOSIS — M109 Gout, unspecified: Secondary | ICD-10-CM | POA: Diagnosis not present

## 2014-10-22 DIAGNOSIS — M109 Gout, unspecified: Secondary | ICD-10-CM | POA: Diagnosis not present

## 2014-10-23 DIAGNOSIS — M109 Gout, unspecified: Secondary | ICD-10-CM | POA: Diagnosis not present

## 2014-10-24 ENCOUNTER — Emergency Department (HOSPITAL_COMMUNITY)
Admission: EM | Admit: 2014-10-24 | Discharge: 2014-10-24 | Disposition: A | Discharge status: Home / Self Care | Payer: Medicare Other | Attending: Emergency Medicine | Admitting: Emergency Medicine

## 2014-10-24 ENCOUNTER — Emergency Department (HOSPITAL_COMMUNITY): Payer: Medicare Other

## 2014-10-24 ENCOUNTER — Encounter (HOSPITAL_COMMUNITY): Payer: Self-pay

## 2014-10-24 DIAGNOSIS — R1013 Epigastric pain: Secondary | ICD-10-CM | POA: Diagnosis not present

## 2014-10-24 DIAGNOSIS — K8020 Calculus of gallbladder without cholecystitis without obstruction: Secondary | ICD-10-CM | POA: Diagnosis not present

## 2014-10-24 DIAGNOSIS — Z7901 Long term (current) use of anticoagulants: Secondary | ICD-10-CM | POA: Insufficient documentation

## 2014-10-24 DIAGNOSIS — I129 Hypertensive chronic kidney disease with stage 1 through stage 4 chronic kidney disease, or unspecified chronic kidney disease: Secondary | ICD-10-CM | POA: Insufficient documentation

## 2014-10-24 DIAGNOSIS — H5442 Blindness, left eye, normal vision right eye: Secondary | ICD-10-CM | POA: Diagnosis not present

## 2014-10-24 DIAGNOSIS — E785 Hyperlipidemia, unspecified: Secondary | ICD-10-CM | POA: Diagnosis not present

## 2014-10-24 DIAGNOSIS — E119 Type 2 diabetes mellitus without complications: Secondary | ICD-10-CM | POA: Insufficient documentation

## 2014-10-24 DIAGNOSIS — Z8673 Personal history of transient ischemic attack (TIA), and cerebral infarction without residual deficits: Secondary | ICD-10-CM | POA: Diagnosis not present

## 2014-10-24 DIAGNOSIS — N189 Chronic kidney disease, unspecified: Secondary | ICD-10-CM | POA: Diagnosis not present

## 2014-10-24 DIAGNOSIS — Z86718 Personal history of other venous thrombosis and embolism: Secondary | ICD-10-CM | POA: Diagnosis not present

## 2014-10-24 DIAGNOSIS — Z862 Personal history of diseases of the blood and blood-forming organs and certain disorders involving the immune mechanism: Secondary | ICD-10-CM | POA: Diagnosis not present

## 2014-10-24 DIAGNOSIS — Z79899 Other long term (current) drug therapy: Secondary | ICD-10-CM | POA: Insufficient documentation

## 2014-10-24 DIAGNOSIS — H409 Unspecified glaucoma: Secondary | ICD-10-CM | POA: Diagnosis not present

## 2014-10-24 DIAGNOSIS — K8080 Other cholelithiasis without obstruction: Secondary | ICD-10-CM | POA: Diagnosis not present

## 2014-10-24 DIAGNOSIS — K429 Umbilical hernia without obstruction or gangrene: Secondary | ICD-10-CM | POA: Diagnosis not present

## 2014-10-24 LAB — URINALYSIS, ROUTINE W REFLEX MICROSCOPIC
BILIRUBIN URINE: NEGATIVE
GLUCOSE, UA: 250 mg/dL — AB
KETONES UR: NEGATIVE mg/dL
Leukocytes, UA: NEGATIVE
Nitrite: NEGATIVE
PH: 5.5 (ref 5.0–8.0)
Protein, ur: 100 mg/dL — AB
SPECIFIC GRAVITY, URINE: 1.016 (ref 1.005–1.030)
Urobilinogen, UA: 0.2 mg/dL (ref 0.0–1.0)

## 2014-10-24 LAB — I-STAT TROPONIN, ED: Troponin i, poc: 0 ng/mL (ref 0.00–0.08)

## 2014-10-24 LAB — CBC
HCT: 31.3 % — ABNORMAL LOW (ref 36.0–46.0)
HEMOGLOBIN: 10.2 g/dL — AB (ref 12.0–15.0)
MCH: 29.4 pg (ref 26.0–34.0)
MCHC: 32.6 g/dL (ref 30.0–36.0)
MCV: 90.2 fL (ref 78.0–100.0)
Platelets: 248 10*3/uL (ref 150–400)
RBC: 3.47 MIL/uL — ABNORMAL LOW (ref 3.87–5.11)
RDW: 13.3 % (ref 11.5–15.5)
WBC: 11 10*3/uL — ABNORMAL HIGH (ref 4.0–10.5)

## 2014-10-24 LAB — COMPREHENSIVE METABOLIC PANEL
ALBUMIN: 3.9 g/dL (ref 3.5–5.2)
ALT: 14 U/L (ref 0–35)
AST: 22 U/L (ref 0–37)
Alkaline Phosphatase: 101 U/L (ref 39–117)
Anion gap: 10 (ref 5–15)
BUN: 22 mg/dL (ref 6–23)
CALCIUM: 9.5 mg/dL (ref 8.4–10.5)
CO2: 22 mmol/L (ref 19–32)
Chloride: 103 mmol/L (ref 96–112)
Creatinine, Ser: 1.41 mg/dL — ABNORMAL HIGH (ref 0.50–1.10)
GFR, EST AFRICAN AMERICAN: 38 mL/min — AB (ref >90)
GFR, EST NON AFRICAN AMERICAN: 33 mL/min — AB (ref >90)
GLUCOSE: 238 mg/dL — AB (ref 70–99)
Potassium: 4 mmol/L (ref 3.5–5.1)
SODIUM: 135 mmol/L (ref 135–145)
TOTAL PROTEIN: 7.6 g/dL (ref 6.0–8.3)
Total Bilirubin: 0.8 mg/dL (ref 0.3–1.2)

## 2014-10-24 LAB — URINE MICROSCOPIC-ADD ON

## 2014-10-24 LAB — LIPASE, BLOOD: LIPASE: 43 U/L (ref 11–59)

## 2014-10-24 MED ORDER — FENTANYL CITRATE 0.05 MG/ML IJ SOLN
25.0000 ug | Freq: Once | INTRAMUSCULAR | Status: AC
  Administered 2014-10-24: 25 ug via INTRAVENOUS
  Filled 2014-10-24: qty 2

## 2014-10-24 MED ORDER — IOHEXOL 300 MG/ML  SOLN: 25.0000 mL | INTRAMUSCULAR | Status: DC

## 2014-10-24 MED ORDER — HYDROCODONE-ACETAMINOPHEN 5-325 MG PO TABS: 1.0000 | ORAL_TABLET | Freq: Four times a day (QID) | ORAL | Status: DC | PRN

## 2014-10-24 MED ORDER — IOHEXOL 300 MG/ML  SOLN: 25.0000 mL | INTRAMUSCULAR | Status: AC

## 2014-10-24 MED ORDER — SODIUM CHLORIDE 0.9 % IV BOLUS (SEPSIS)
1000.0000 mL | Freq: Once | INTRAVENOUS | Status: AC
  Administered 2014-10-24: 1000 mL via INTRAVENOUS

## 2014-10-24 MED ORDER — HYDROMORPHONE HCL 1 MG/ML IJ SOLN
1.0000 mg | Freq: Once | INTRAMUSCULAR | Status: AC
  Administered 2014-10-24: 1 mg via INTRAVENOUS
  Filled 2014-10-24: qty 1

## 2014-10-24 MED ORDER — ONDANSETRON 4 MG PO TBDP: ORAL_TABLET | ORAL | Status: DC

## 2014-10-24 MED ORDER — IOHEXOL 300 MG/ML  SOLN
80.0000 mL | Freq: Once | INTRAMUSCULAR | Status: AC | PRN
  Administered 2014-10-24: 80 mL via INTRAVENOUS

## 2014-10-24 NOTE — ED Provider Notes (Signed)
CSN: [id number]     Arrival date & time 10/24/14  [id number] History   First MD Initiated Contact with Patient 10/24/14 [id number]     Chief Complaint  Patient presents with  . Abdominal Pain     (Consider location/radiation/quality/duration/timing/severity/associated sxs/prior Treatment) Patient is a 81 y.o. female presenting with abdominal pain. The history is provided by the patient.  Abdominal Pain Pain location:  Epigastric, periumbilical, LUQ and RUQ Pain quality: aching   Pain radiation: lower abdomen. Pain severity:  Moderate Onset quality:  Gradual Timing:  Constant Progression:  Waxing and waning Chronicity:  New Context: not previous surgeries, not recent illness, not retching, not sick contacts and not suspicious food intake   Relieved by: Mylanta. Worsened by:  Nothing tried Associated symptoms: nausea (intermittent)   Associated symptoms: no chest pain, no chills, no cough, no diarrhea, no fever, no shortness of breath and no vomiting     Past Medical History  Diagnosis Date  . Diabetes mellitus   . Hyperlipidemia   . Hypertension   . CVA (cerebrovascular accident)  October 2007     CT of the head atrophy with multiple remote insults noted but no definite acute findings  . Blindness of left eye      likely related to stroke, left cataract removed from that eye with complications  . Anemia      normocytic anemia with baseline hemoglobin 10-11  . Chronic kidney disease 2006     left renal artery stenosis with probable hemodynamic significance, kidneys are normal in morphology without focal lesions or hydronephrosis this is based but cannot A. of the abdomen with and without contrast done on the 31st 2006  . Glaucoma   . Personal history of DVT (deep vein thrombosis) 08/11/2010   Past Surgical History  Procedure Laterality Date  . Transthoracic echocardiogram   May 2008     left ventricular systolic function normal EF estimated range of 55-60%, no definite diagnostic  evidence of left ventricular regional wall motion abnormalities, Doppler parameters consistent with abnormal left ventricular relaxation, findings suggestive of possible bicuspid aortic valve and aortic valve thickness mildly increase   Family History  Problem Relation Age of Onset  . Heart disease Mother   . Diabetes Mother   . Hyperlipidemia Mother   . Hypertension Mother   . Heart disease Father   . Diabetes Father   . Hyperlipidemia Father   . Hypertension Father    History  Substance Use Topics  . Smoking status: Never Smoker   . Smokeless tobacco: Never Used  . Alcohol Use: No   OB History    No data available     Review of Systems  Constitutional: Negative for fever and chills.  Respiratory: Negative for cough and shortness of breath.   Cardiovascular: Negative for chest pain and leg swelling.  Gastrointestinal: Positive for nausea (intermittent) and abdominal pain. Negative for vomiting and diarrhea.  All other systems reviewed and are negative.     Allergies  Review of patient's allergies indicates no known allergies.  Home Medications   Prior to Admission medications   Medication Sig Start Date End Date Taking? Authorizing Provider  ACCU-CHEK SOFTCLIX LANCETS lancets Use 1 time daily to check blood sugar. 08/13/14   Francesca Oman, DO  acetaminophen (TYLENOL) 325 MG tablet Take 2 tablets (650 mg total) by mouth every 6 (six) hours as needed. 08/22/14   Juluis Mire, MD  amLODipine (NORVASC) 10 MG tablet Take 1 tablet (10 mg total)  by mouth daily. For blood pressure 09/25/14 09/24/15  Francesca Oman, DO  benazepril (LOTENSIN) 40 MG tablet Take 1 tablet (40 mg total) by mouth daily. 04/15/14   Oval Linsey, MD  Blood Glucose Monitoring Suppl (ACCU-CHEK AVIVA) device Use as instructed 02/15/14 02/15/15  Francesca Oman, DO  Calcium-Vitamin D (CHEWABLE CALCIUM/D) 300 MG CHEW Chew by mouth. Take as directed     Historical Provider, MD  Cholecalciferol (VITAMIN D) 2000 UNITS  tablet Take 1 tablet (2,000 Units total) by mouth daily. 08/23/14   Juluis Mire, MD  colchicine 0.6 MG tablet take 2 tabs at first sign of gout flare, followed in 1 hour with 1 tab 10/15/14   Oval Linsey, MD  furosemide (LASIX) 40 MG tablet Take 40mg  in the morning and 20mg  with supper. 09/04/14   Francesca Oman, DO  glipiZIDE (GLUCOTROL XL) 10 MG 24 hr tablet TAKE ONE TABLET BY MOUTH EVERY DAY 09/25/14   Francesca Oman, DO  glucose blood (ACCU-CHEK AVIVA PLUS) test strip Use 1 time daily to check blood sugar. diag code 250.00. Non-insulin dependent 02/12/14   Francesca Oman, DO  LUMIGAN 0.01 % SOLN  12/05/13   Historical Provider, MD  metoprolol succinate (TOPROL-XL) 25 MG 24 hr tablet TAKE ONE-HALF TABLET BY MOUTH EVERY DAY 09/13/14   Aldine Contes, MD  rosuvastatin (CRESTOR) 5 MG tablet TAKE ONE TABLET BY MOUTH AT BEDTIME    Nischal Narendra, MD  sitaGLIPtin (JANUVIA) 50 MG tablet Take 1 tablet (50 mg total) by mouth daily. 09/25/14   Francesca Oman, DO  warfarin (COUMADIN) 5 MG tablet Take 1 tablet by mouth daily, except 1.5 mg on Mondays, Wednesdays, and Fridays 10/14/14   Oval Linsey, MD   BP 171/71 mmHg  Pulse 94  Temp(Src) 97.9 F (36.6 C) (Oral)  Resp 20  Ht 5\' 3"  (1.6 m)  Wt 165 lb (74.844 kg)  BMI 29.24 kg/m2  SpO2 98% Physical Exam  Constitutional: She is oriented to person, place, and time. She appears well-developed and well-nourished. No distress.  HENT:  Head: Normocephalic and atraumatic.  Mouth/Throat: Oropharynx is clear and moist.  Eyes: EOM are normal. Pupils are equal, round, and reactive to light.  Neck: Normal range of motion. Neck supple.  Cardiovascular: Normal rate and regular rhythm.  Exam reveals no friction rub.   No murmur heard. Pulmonary/Chest: Effort normal and breath sounds normal. No respiratory distress. She has no wheezes. She has no rales.  Abdominal: Soft. She exhibits distension (mild). There is tenderness (RUQ, LUQ, periumbilical). There is no  rebound.  Musculoskeletal: Normal range of motion. She exhibits no edema.  Neurological: She is alert and oriented to person, place, and time.  Skin: No rash noted. She is not diaphoretic.  Nursing note and vitals reviewed.   ED Course  Procedures (including critical care time) Labs Review Labs Reviewed  CBC  COMPREHENSIVE METABOLIC PANEL  LIPASE, BLOOD  URINALYSIS, ROUTINE W REFLEX MICROSCOPIC  I-STAT [place], ED    Imaging Review Ct Abdomen Pelvis W Contrast  10/24/2014   CLINICAL DATA:  Abdominal pain  EXAM: CT ABDOMEN AND PELVIS WITH CONTRAST  TECHNIQUE: Multidetector CT imaging of the abdomen and pelvis was performed using the standard protocol following bolus administration of intravenous contrast.  CONTRAST:  31mL OMNIPAQUE IOHEXOL 300 MG/ML  SOLN  COMPARISON:  None.  FINDINGS: Lung bases are clear. Mild cardiac enlargement. Coronary calcification.  Liver and spleen are normal. Cholelithiasis without gallbladder thickening. No biliary dilatation  Kidneys and adrenal glands are normal. No renal mass or obstruction. No renal calculi. Urinary bladder normal.  Diffuse atherosclerotic calcification. No aortic aneurysm. Aorta and iliacs are patent bilaterally.  Negative for bowel obstruction. No bowel thickening or mass. Appendiceal stump is noted. This may be a small appendix or there may have been prior appendectomy. No evidence of acute appendicitis.  Moderately large periumbilical hernia containing fat. No acute findings in the hernia.  Diffuse lumbar degenerative change without focal bony lesion.  IMPRESSION: Cholelithiasis  Umbilical hernia containing fat.  No fluid or edema in the fat.   Electronically Signed   By: Franchot Gallo M.D.   On: 10/24/2014 09:15   US Abdomen Limited Ruq  10/24/2014   CLINICAL DATA:  81 year old female with right abdominal pain since last night. Cholelithiasis. Initial encounter.  EXAM: US ABDOMEN LIMITED - RIGHT UPPER QUADRANT  COMPARISON:  CT Abdomen and  Pelvis 0846 hours today.  FINDINGS: Gallbladder:  The gallbladder appears mildly to moderately distended, but the gallbladder wall remains normal at 1 mm. There is dependent sludge and/or tiny stones (image 7). No pericholecystic fluid. No sonographic Murphy sign elicited.  Common bile duct:  Diameter: 4 mm, normal  Liver:  No focal lesion identified. Within normal limits in parenchymal echogenicity. No intrahepatic biliary ductal dilatation.  Other findings: Visible right kidney remarkable for increased renal cortical echogenicity.  IMPRESSION: 1. Gallbladder sludge and gravel like stones but no strong sonographic evidence of acute cholecystitis. No biliary obstruction. 2. Evidence of chronic medical renal disease.   Electronically Signed   By: Genevie Ann M.D.   On: 10/24/2014 11:03     EKG Interpretation   Date/Time:  Thursday October 24 2014 07:16:41 EDT Ventricular Rate:  89 PR Interval:  245 QRS Duration: 95 QT Interval:  386 QTC Calculation: 470 R Axis:   -66 Text Interpretation:  Sinus rhythm Prolonged PR interval Left anterior  fascicular block Abnormal R-wave progression, early transition Left  ventricular hypertrophy Anterior Q waves, possibly due to LVH Nonspecific  T abnormalities, lateral leads T wave inversion laterally, new Confirmed  by [place] ([id number]) on 10/24/2014 7:40:44 AM      MDM   Final diagnoses:  Cholelithiasis    5F here with abdominal pain. Began last night, mildly better with Mylanta, no worsening factors. AFVSS here. LUQ, RUQ, periumbilical pain on exam with mild distention.  CT shows some GB distention, no pericholecystic fluid, no wall thickening. Cholelithiasis noted. US done, no signs of cholecystitis. Given Surgery f/u. Stable for discharge.    Evelina Bucy, MD 10/24/14 [phone]

## 2014-10-24 NOTE — ED Notes (Signed)
Pt states she started having severe achy pain on right abdominal on last evening; pt has increased throughout the night; pt c/o 7/10 pain; pt states she still have gallbladder and appendix; pt lives alone and normally ambulates by self with occasional use of walker/cane; pt A&Ox4 on arrival; pt c/o of nausea but denies vomiting; last BM 2 days ago; positive bowels sounds on arrival.

## 2014-10-24 NOTE — Discharge Instructions (Signed)
Cholelithiasis °Cholelithiasis (also called gallstones) is a form of gallbladder disease in which gallstones form in your gallbladder. The gallbladder is an organ that stores bile made in the liver, which helps digest fats. Gallstones begin as small crystals and slowly grow into stones. Gallstone pain occurs when the gallbladder spasms and a gallstone is blocking the duct. Pain can also occur when a stone passes out of the duct.  °RISK FACTORS °· Being female.   °· Having multiple pregnancies. Health care providers sometimes advise removing diseased gallbladders before future pregnancies.   °· Being obese. °· Eating a diet heavy in fried foods and fat.   °· Being older than 60 years and increasing age.   °· Prolonged use of medicines containing female hormones.   °· Having diabetes mellitus.   °· Rapidly losing weight.   °· Having a family history of gallstones (heredity).   °SYMPTOMS °· Nausea.   °· Vomiting. °· Abdominal pain.   °· Yellowing of the skin (jaundice).   °· Sudden pain. It may persist from several minutes to several hours. °· Fever.   °· Tenderness to the touch.  °In some cases, when gallstones do not move into the bile duct, people have no pain or symptoms. These are called "silent" gallstones.  °TREATMENT °Silent gallstones do not need treatment. In severe cases, emergency surgery may be required. Options for treatment include: °· Surgery to remove the gallbladder. This is the most common treatment. °· Medicines. These do not always work and may take 6-12 months or more to work. °· Shock wave treatment (extracorporeal biliary lithotripsy). In this treatment an ultrasound machine sends shock waves to the gallbladder to break gallstones into smaller pieces that can pass into the intestines or be dissolved by medicine. °HOME CARE INSTRUCTIONS  °· Only take over-the-counter or prescription medicines for pain, discomfort, or fever as directed by your health care provider.   °· Follow a low-fat diet until  seen again by your health care provider. Fat causes the gallbladder to contract, which can result in pain.   °· Follow up with your health care provider as directed. Attacks are almost always recurrent and surgery is usually required for permanent treatment.   °SEEK IMMEDIATE MEDICAL CARE IF:  °· Your pain increases and is not controlled by medicines.   °· You have a fever or persistent symptoms for more than 2-3 days.   °· You have a fever and your symptoms suddenly get worse.   °· You have persistent nausea and vomiting.   °MAKE SURE YOU:  °· Understand these instructions. °· Will watch your condition. °· Will get help right away if you are not doing well or get worse. °Document Released: 07/08/2005 Document Revised: 03/14/2013 Document Reviewed: 01/03/2013 °Exit[employer]® Patient Information ©2015 [employer]. This information is not intended to replace advice given to you by your health care provider. Make sure you discuss any questions you have with your health care provider. ° °

## 2014-10-24 NOTE — ED Notes (Signed)
MD at bedside. 

## 2014-10-25 ENCOUNTER — Telehealth: Payer: Self-pay | Admitting: Dietician

## 2014-10-25 ENCOUNTER — Telehealth: Payer: Self-pay | Admitting: Internal Medicine

## 2014-10-25 DIAGNOSIS — M109 Gout, unspecified: Secondary | ICD-10-CM | POA: Diagnosis not present

## 2014-10-25 NOTE — Telephone Encounter (Signed)
Call to patient to confirm appointment for 10/28/14 at 3:15 no vm box

## 2014-10-25 NOTE — Telephone Encounter (Signed)
Patient says she went to eye doctor recently across the street from the hospital on [address]. Will call to request the records.  Saw  Dr. Katy Fitch in march 2016. Results requested

## 2014-10-28 ENCOUNTER — Encounter: Payer: Self-pay | Admitting: Internal Medicine

## 2014-10-28 ENCOUNTER — Ambulatory Visit (INDEPENDENT_AMBULATORY_CARE_PROVIDER_SITE_OTHER): Payer: Medicare Other | Admitting: Internal Medicine

## 2014-10-28 VITALS — BP 145/62 | HR 80 | Temp 99.0°F | Wt 160.3 lb

## 2014-10-28 DIAGNOSIS — M859 Disorder of bone density and structure, unspecified: Secondary | ICD-10-CM

## 2014-10-28 DIAGNOSIS — M8580 Other specified disorders of bone density and structure, unspecified site: Secondary | ICD-10-CM | POA: Diagnosis not present

## 2014-10-28 DIAGNOSIS — M109 Gout, unspecified: Secondary | ICD-10-CM | POA: Diagnosis not present

## 2014-10-28 MED ORDER — CHOLECALCIFEROL 25 MCG (1000 UT) PO TBDP: 1000.0000 [IU] | ORAL_TABLET | Freq: Every day | ORAL | Status: DC

## 2014-10-28 NOTE — Progress Notes (Signed)
   Subjective:    Patient ID: Chelsea Mcdaniel, female    DOB: [birth year], 81 y.o.   MRN: [id number]  HPI Comments: [name] is an 81 year old woman with a PMH as below here for follow-up of lab results.  Please see problem based charting for details.    Past Medical History  Diagnosis Date  . Diabetes mellitus   . Hyperlipidemia   . Hypertension   . CVA (cerebrovascular accident)  October 2007     CT of the head atrophy with multiple remote insults noted but no definite acute findings  . Blindness of left eye      likely related to stroke, left cataract removed from that eye with complications  . Anemia      normocytic anemia with baseline hemoglobin 10-11  . Chronic kidney disease 2006     left renal artery stenosis with probable hemodynamic significance, kidneys are normal in morphology without focal lesions or hydronephrosis this is based but cannot A. of the abdomen with and without contrast done on the 31st 2006  . Glaucoma   . Personal history of DVT (deep vein thrombosis) 08/11/2010    Review of Systems  Constitutional: Negative for fever, chills and appetite change.  Respiratory: Negative for shortness of breath.   Cardiovascular: Negative for chest pain.  Gastrointestinal: Negative for nausea, vomiting, diarrhea and constipation.  Genitourinary: Negative for dysuria.       Filed Vitals:   10/28/14 1607  BP: 145/62  Pulse: 80  Temp: 99 F (37.2 C)  TempSrc: Oral  Weight: 160 lb 4.8 oz (72.712 kg)  SpO2: 98%   Objective:   Physical Exam  Constitutional: She is oriented to person, place, and time. She appears well-developed. No distress.  HENT:  Head: Normocephalic and atraumatic.  Mouth/Throat: Oropharynx is clear and moist. No oropharyngeal exudate.  Eyes: EOM are normal.  Right eye PERRL, left eye with post-op changes and unresponsive (unchanged from prior exams)   Neck: Neck supple.  Cardiovascular: Normal rate and regular rhythm.  Exam reveals no gallop  and no friction rub.   No murmur heard. Pulmonary/Chest: Effort normal and breath sounds normal. No respiratory distress. She has no wheezes. She has no rales.  Abdominal: Soft. Bowel sounds are normal. She exhibits no distension and no mass. There is no tenderness. There is no rebound and no guarding.  Musculoskeletal: Normal range of motion. She exhibits no edema or tenderness.  Neurological: She is alert and oriented to person, place, and time. No cranial nerve deficit.  Skin: Skin is warm. She is not diaphoretic.  Psychiatric: She has a normal mood and affect. Her behavior is normal. Thought content normal.  Vitals reviewed.         Assessment & Plan:  Please see problem based assessment and plan.

## 2014-10-28 NOTE — Patient Instructions (Signed)
1. Please take vitamin D 1000 units daily and calcium 1200mg  daily. We will follow-up with labs at our next visit in June.    2. Please take all medications as prescribed.     3. If you have worsening of your symptoms or new symptoms arise, please call the clinic [phone]), or go to the ER immediately if symptoms are severe.

## 2014-10-29 DIAGNOSIS — M8580 Other specified disorders of bone density and structure, unspecified site: Secondary | ICD-10-CM | POA: Insufficient documentation

## 2014-10-29 DIAGNOSIS — M109 Gout, unspecified: Secondary | ICD-10-CM | POA: Diagnosis not present

## 2014-10-29 DIAGNOSIS — M859 Disorder of bone density and structure, unspecified: Secondary | ICD-10-CM | POA: Insufficient documentation

## 2014-10-29 NOTE — Assessment & Plan Note (Addendum)
03/21 PTH 209, phosphorous 3, Ca 9, renal function stable (CKD3), vitamin D is 29.  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  She uses a cane for assistance outside of the home and has not had a recent fall. - start vitamin D 1000 units daily and calcium 1200mg  daily OTC - I will not start bisphosphonate at this time as it is not recommended with CrCl < 35 (her current GFR is 43 but it has been lower than 35 recently) and she has evidence of CKD-MBD. - repeat labs (BMP, Ca, Phos, vitamin D) at next visit in 2 months - renal function is stable and I think it is too early for referral to nephrology at this point; we will continue to work on BP and DM control.

## 2014-10-30 DIAGNOSIS — M109 Gout, unspecified: Secondary | ICD-10-CM | POA: Diagnosis not present

## 2014-10-30 NOTE — Progress Notes (Signed)
Case discussed with Dr. Wilson soon after the resident saw the patient. We reviewed the resident's history and exam and pertinent patient test results. I agree with the assessment, diagnosis, and plan of care documented in the resident's note. 

## 2014-10-31 DIAGNOSIS — M109 Gout, unspecified: Secondary | ICD-10-CM | POA: Diagnosis not present

## 2014-11-01 DIAGNOSIS — M109 Gout, unspecified: Secondary | ICD-10-CM | POA: Diagnosis not present

## 2014-11-04 DIAGNOSIS — M109 Gout, unspecified: Secondary | ICD-10-CM | POA: Diagnosis not present

## 2014-11-05 ENCOUNTER — Encounter: Payer: Self-pay | Admitting: Internal Medicine

## 2014-11-05 ENCOUNTER — Ambulatory Visit (INDEPENDENT_AMBULATORY_CARE_PROVIDER_SITE_OTHER): Payer: Medicare Other | Admitting: Internal Medicine

## 2014-11-05 ENCOUNTER — Other Ambulatory Visit: Payer: Self-pay | Admitting: Internal Medicine

## 2014-11-05 VITALS — BP 160/60 | HR 69 | Temp 98.4°F | Ht 62.0 in | Wt 160.0 lb

## 2014-11-05 DIAGNOSIS — M1A3410 Chronic gout due to renal impairment, right hand, without tophus (tophi): Secondary | ICD-10-CM

## 2014-11-05 DIAGNOSIS — M109 Gout, unspecified: Secondary | ICD-10-CM | POA: Diagnosis not present

## 2014-11-05 NOTE — Assessment & Plan Note (Signed)
Patient presentation today, very consistent with a gout flare. HAs not tried colchicine. Pain has been on going for about 72 hrs now. Also has CKD stage 3 with GFR- 38. No prior uric acid levels.  Plan- Patient to take 1.2 mg of colchicine on getting home, and then 0.6mg   One hour later. - Pt to continue at 0.6mg  once a daily, for 5 days if by tomorrow she has started having significant relief of symptoms, otherwise will cont at 0.6 BID for 5 days. - If no relief when treatment is continued or by day 4 to come in to see [place]. - Otherwise appointment in 2 weeks, consider starting uricosuric agent like allorpurinol as she has CKD3 and so uricosurics would be indicated. - Consider checking uric acid level at next visit. - Patient voiced understanding and repeated instructions back to me.

## 2014-11-05 NOTE — Progress Notes (Signed)
Internal Medicine Clinic Attending  Case discussed with Dr. Emokpae at the time of the visit.  We reviewed the resident's history and exam and pertinent patient test results.  I agree with the assessment, diagnosis, and plan of care documented in the resident's note.  

## 2014-11-05 NOTE — Patient Instructions (Signed)
We want tou to take your Gout medication called Colchicine- that 1.2 mg to start, then take another 0.6mg  in 1 hour. Then continue with 0.6mg  once a day for 5 days, if there is improvement. But if no improvement continue taking 0.6mg  two times a day for 5 days. If your pain does not get better after 5 days, call us for an appointment.  We will like to see you in 2 weeks, we will consider starting other treatment for your gout to prevent you from having more gout attacks.

## 2014-11-05 NOTE — Progress Notes (Signed)
Patient ID: Chelsea Mcdaniel, female   DOB: [birth year], 81 y.o.   MRN: [id number]   Subjective:   Patient ID: Chelsea Mcdaniel female   DOB: 26-Jan-1928 81 y.o.   MRN: [id number]  HPI: [name] is a 81 y.o. with PMh listed below. Presented today with c/o of right hand pain and swelling athst started 4 days ago. Has had similar pain in same hand years ago, didn't last this long, was relieved with tylenol. No  Trauma. Usually has gout attackes in her foot, sometimes in her neck. Last gout flare she thinks is 6-7 months ago, which resolved with he gout medication. For this episode of pain  She has taken only tylenol. Denies redness, pain is stable and not increasing in severity.  No associated fever, denies nausea, vomiting or feeling ill.   Past Medical History  Diagnosis Date  . Diabetes mellitus   . Hyperlipidemia   . Hypertension   . CVA (cerebrovascular accident)  October 2007     CT of the head atrophy with multiple remote insults noted but no definite acute findings  . Blindness of left eye      likely related to stroke, left cataract removed from that eye with complications  . Anemia      normocytic anemia with baseline hemoglobin 10-11  . Chronic kidney disease 2006     left renal artery stenosis with probable hemodynamic significance, kidneys are normal in morphology without focal lesions or hydronephrosis this is based but cannot A. of the abdomen with and without contrast done on the 31st 2006  . Glaucoma   . Personal history of DVT (deep vein thrombosis) 08/11/2010   Current Outpatient Prescriptions  Medication Sig Dispense Refill  . ACCU-CHEK SOFTCLIX LANCETS lancets Use 1 time daily to check blood sugar. 100 each 1  . acetaminophen (TYLENOL) 325 MG tablet Take 2 tablets (650 mg total) by mouth every 6 (six) hours as needed.    Marland Kitchen amLODipine (NORVASC) 10 MG tablet Take 1 tablet (10 mg total) by mouth daily. For blood pressure 90 tablet 3  . benazepril (LOTENSIN) 40 MG tablet  Take 1 tablet (40 mg total) by mouth daily. 90 tablet 3  . Blood Glucose Monitoring Suppl (ACCU-CHEK AVIVA) device Use as instructed 1 each 0  . Cholecalciferol (VITAMIN D) 2000 UNITS tablet Take 1 tablet (2,000 Units total) by mouth daily.    . Cholecalciferol 1000 UNITS TBDP Take 1,000 Units by mouth daily. 30 tablet 3  . colchicine 0.6 MG tablet take 2 tabs at first sign of gout flare, followed in 1 hour with 1 tab 10 tablet 1  . furosemide (LASIX) 40 MG tablet Take 40mg  in the morning and 20mg  with supper. 90 tablet 1  . glipiZIDE (GLUCOTROL XL) 10 MG 24 hr tablet TAKE ONE TABLET BY MOUTH EVERY DAY 90 tablet 0  . glucose blood (ACCU-CHEK AVIVA PLUS) test strip Use 1 time daily to check blood sugar. diag code 250.00. Non-insulin dependent 100 each 3  . HYDROcodone-acetaminophen (NORCO/VICODIN) 5-325 MG per tablet Take 1 tablet by mouth every 6 (six) hours as needed for moderate pain. 20 tablet 0  . LUMIGAN 0.01 % SOLN Place 1 drop into both eyes every morning.     . metoprolol succinate (TOPROL-XL) 25 MG 24 hr tablet TAKE ONE-HALF TABLET BY MOUTH EVERY DAY 45 tablet 3  . ondansetron (ZOFRAN ODT) 4 MG disintegrating tablet 1 tab sublingual every 6 PRN nausea/vomiting 10 tablet 0  .  rosuvastatin (CRESTOR) 5 MG tablet TAKE ONE TABLET BY MOUTH AT BEDTIME 90 tablet 2  . sitaGLIPtin (JANUVIA) 50 MG tablet Take 1 tablet (50 mg total) by mouth daily. 90 tablet 0  . warfarin (COUMADIN) 5 MG tablet Take 1 tablet by mouth daily, except 1.5 mg on Mondays, Wednesdays, and Fridays (Patient taking differently: Take 5-7.5 mg by mouth daily. Take 1 tablet by mouth daily, except 1 and 1/2 tablets on Mondays, Wednesdays, and Fridays) 36 tablet 3   No current facility-administered medications for this visit.   Family History  Problem Relation Age of Onset  . Heart disease Mother   . Diabetes Mother   . Hyperlipidemia Mother   . Hypertension Mother   . Heart disease Father   . Diabetes Father   .  Hyperlipidemia Father   . Hypertension Father    History   Social History  . Marital Status: Widowed    Spouse Name: N/A  . Number of Children: N/A  . Years of Education: N/A   Social History Main Topics  . Smoking status: Never Smoker   . Smokeless tobacco: Never Used  . Alcohol Use: No  . Drug Use: No  . Sexual Activity: Not on file   Other Topics Concern  . None   Social History Narrative    Patient is a widow. She has 11 children 5 of who are living. She is retired in 1993 from [employer]. She denies tobacco alcohol or drug use.   Review of Systems: CONSTITUTIONAL- No Fever,or change in appetite. SKIN- No Rash, colour changes or itching. HEAD- No Headache or dizziness. RESPIRATORY- No Cough or SOB. CARDIAC- No  DOE, or chest pain. GI- Novomiting, abd pain. URINARY- No Frequency, or dysuria. NEUROLOGIC- No  syncope.  Objective:  Physical Exam: Filed Vitals:   11/05/14 1510  BP: 160/60  Pulse: 69  Temp: 98.4 F (36.9 C)  TempSrc: Oral  Height: 5\' 2"  (1.575 m)  Weight: 160 lb (72.576 kg)  SpO2: 100%   GENERAL- alert, co-operative, appears as stated age, not in any distress. HEENT- Atraumatic, normocephalic, PERRL, EOMI, oral mucosa appears moist, CARDIAC- RRR, no murmurs, rubs or gallops. RESP- Moving equal volumes of air, and clear to auscultation bilaterally, no wheezes or crackles. ABDOMEN- Soft, nontender. BACK- Normal curvature of the spine, No tenderness along the vertebrae, no CVA tenderness. NEURO- No obvious Cr N abnormality, strenght intact globally, in a wheel chair. EXTREMITIES- Trace pitting pedal edema, right hand- swelling of 1st and 2nd fingers extending to wrist, no streaking, reduced range of motion of fingers 2/2 to pain and swelling, normal skin colour, compared to left hand, slight increased warmth, with tenderness to palpation. Other extremities appear normal.  SKIN- Warm, dry, No rash or lesion. PSYCH- Normal mood and affect,  appropriate thought content and speech.  Assessment & Plan:   The patient's case and plan of care was discussed with attending physician, Dr. Ellwood Dense.  Please see problem based charting for assessment and plan.

## 2014-11-06 DIAGNOSIS — M109 Gout, unspecified: Secondary | ICD-10-CM | POA: Diagnosis not present

## 2014-11-07 DIAGNOSIS — M109 Gout, unspecified: Secondary | ICD-10-CM | POA: Diagnosis not present

## 2014-11-08 DIAGNOSIS — M109 Gout, unspecified: Secondary | ICD-10-CM | POA: Diagnosis not present

## 2014-11-11 ENCOUNTER — Encounter: Payer: Self-pay | Admitting: *Deleted

## 2014-11-11 DIAGNOSIS — M109 Gout, unspecified: Secondary | ICD-10-CM | POA: Diagnosis not present

## 2014-11-12 DIAGNOSIS — M109 Gout, unspecified: Secondary | ICD-10-CM | POA: Diagnosis not present

## 2014-11-13 DIAGNOSIS — M109 Gout, unspecified: Secondary | ICD-10-CM | POA: Diagnosis not present

## 2014-11-14 DIAGNOSIS — M109 Gout, unspecified: Secondary | ICD-10-CM | POA: Diagnosis not present

## 2014-11-15 DIAGNOSIS — M109 Gout, unspecified: Secondary | ICD-10-CM | POA: Diagnosis not present

## 2014-11-18 ENCOUNTER — Ambulatory Visit (INDEPENDENT_AMBULATORY_CARE_PROVIDER_SITE_OTHER): Payer: Medicare Other | Admitting: Pharmacist

## 2014-11-18 DIAGNOSIS — M109 Gout, unspecified: Secondary | ICD-10-CM | POA: Diagnosis not present

## 2014-11-18 DIAGNOSIS — Z8673 Personal history of transient ischemic attack (TIA), and cerebral infarction without residual deficits: Secondary | ICD-10-CM

## 2014-11-18 DIAGNOSIS — I82509 Chronic embolism and thrombosis of unspecified deep veins of unspecified lower extremity: Secondary | ICD-10-CM

## 2014-11-18 DIAGNOSIS — Z7901 Long term (current) use of anticoagulants: Secondary | ICD-10-CM | POA: Diagnosis not present

## 2014-11-18 LAB — POCT INR: INR: 2.5

## 2014-11-18 NOTE — Patient Instructions (Signed)
Patient instructed to take medications as defined in the Anti-coagulation Track section of this encounter.  Patient instructed to take today's dose.  Patient verbalized understanding of these instructions.    

## 2014-11-18 NOTE — Progress Notes (Signed)
INTERNAL MEDICINE TEACHING ATTENDING ADDENDUM - Aldine Contes M.D  Duration- indefinite, Indication- Recurrent CVA's, DVT, INR- therapeutic. Agree with Dr. Gladstone Pih recommendations as outlined in his note.

## 2014-11-18 NOTE — Progress Notes (Signed)
Anti-Coagulation Progress Note  Chelsea Mcdaniel is a 81 y.o. female who is currently on an anti-coagulation regimen.  RECENT RESULTS:  Recent results are below, the most recent result is correlated with a dose of 42.5 mg. per week:  Lab Results  Component Value Date   INR 2.5 11/18/2014   INR 1.8 10/14/2014   INR 3.2 09/23/2014    ANTI-COAG DOSE:  Anticoagulation Dose Instructions as of 11/18/2014      [name] Tue Wed Thu Fri Sat   New Dose 5 mg 7.5 mg 5 mg 7.5 mg 5 mg 7.5 mg 5 mg    Description        Continue same regimen      ANTICOAG SUMMARY:  Anticoagulation Episode Summary    Current INR goal    Next INR check 12/09/2014   INR from last check 2.5 (11/18/2014)   Weekly max dose    Target end date    INR check location    Preferred lab    Send INR reminders to       Comments         ANTICOAG TODAY:  Anticoagulation Summary as of 11/18/2014    INR goal    Selected INR 2.5 (11/18/2014)   Next INR check 12/09/2014   Target end date     Anticoagulation Episode Summary    INR check location    Preferred lab    Send INR reminders to    Comments       PATIENT INSTRUCTIONS:  Patient Instructions  Patient instructed to take medications as defined in the Anti-coagulation Track section of this encounter.  Patient instructed to take today's dose.  Patient verbalized understanding of these instructions.       FOLLOW-UP  Return in 3 weeks (on 12/09/2014) for Follow up INR @ 2:45pm.    Thank you for allowing pharmacy to be part of this patient's care team  [place], Pharm.D Clinical Pharmacy Resident Pager: [phone] 11/18/2014 .3:02 PM

## 2014-11-19 DIAGNOSIS — M109 Gout, unspecified: Secondary | ICD-10-CM | POA: Diagnosis not present

## 2014-11-20 ENCOUNTER — Telehealth: Payer: Self-pay | Admitting: *Deleted

## 2014-11-20 DIAGNOSIS — M109 Gout, unspecified: Secondary | ICD-10-CM | POA: Diagnosis not present

## 2014-11-20 NOTE — Telephone Encounter (Signed)
Pt states she has taken Colchicine as directed and the 5 days are up.  She still is having some hand pain but also pain is up in shoulder.  She is asking if she should continue the Colchicine BID until next appointment 5/4. Pt # [id number]

## 2014-11-21 DIAGNOSIS — M109 Gout, unspecified: Secondary | ICD-10-CM | POA: Diagnosis not present

## 2014-11-21 NOTE — Telephone Encounter (Signed)
Pt informed and voices understanding 

## 2014-11-21 NOTE — Telephone Encounter (Signed)
No, please ask her to stop colchicine for now, especially given her CKD.  The pain is in both her hands and shoulders?  Not sure if this is a gout flare or related to her osteoarthritis.  Can you please ask her to try Tylenol prn for pain until she sees me next week?

## 2014-11-22 DIAGNOSIS — M109 Gout, unspecified: Secondary | ICD-10-CM | POA: Diagnosis not present

## 2014-11-25 DIAGNOSIS — M109 Gout, unspecified: Secondary | ICD-10-CM | POA: Diagnosis not present

## 2014-11-26 DIAGNOSIS — M109 Gout, unspecified: Secondary | ICD-10-CM | POA: Diagnosis not present

## 2014-11-27 ENCOUNTER — Ambulatory Visit (INDEPENDENT_AMBULATORY_CARE_PROVIDER_SITE_OTHER): Payer: Medicare Other | Admitting: Internal Medicine

## 2014-11-27 ENCOUNTER — Encounter: Payer: Self-pay | Admitting: Internal Medicine

## 2014-11-27 VITALS — BP 109/52 | HR 65 | Temp 97.6°F | Ht 62.0 in | Wt 156.3 lb

## 2014-11-27 DIAGNOSIS — M1A30X0 Chronic gout due to renal impairment, unspecified site, without tophus (tophi): Secondary | ICD-10-CM | POA: Diagnosis not present

## 2014-11-27 DIAGNOSIS — E118 Type 2 diabetes mellitus with unspecified complications: Secondary | ICD-10-CM

## 2014-11-27 DIAGNOSIS — M1A3410 Chronic gout due to renal impairment, right hand, without tophus (tophi): Secondary | ICD-10-CM

## 2014-11-27 DIAGNOSIS — N289 Disorder of kidney and ureter, unspecified: Secondary | ICD-10-CM

## 2014-11-27 DIAGNOSIS — M109 Gout, unspecified: Secondary | ICD-10-CM | POA: Diagnosis not present

## 2014-11-27 LAB — BASIC METABOLIC PANEL WITH GFR
BUN: 26 mg/dL — ABNORMAL HIGH (ref 6–23)
CALCIUM: 8.8 mg/dL (ref 8.4–10.5)
CO2: 24 mEq/L (ref 19–32)
CREATININE: 1.49 mg/dL — AB (ref 0.50–1.10)
Chloride: 101 mEq/L (ref 96–112)
GFR, EST NON AFRICAN AMERICAN: 32 mL/min — AB
GFR, Est African American: 37 mL/min — ABNORMAL LOW
Glucose, Bld: 243 mg/dL — ABNORMAL HIGH (ref 70–99)
Potassium: 4.4 mEq/L (ref 3.5–5.3)
Sodium: 138 mEq/L (ref 135–145)

## 2014-11-27 LAB — GLUCOSE, CAPILLARY: Glucose-Capillary: 269 mg/dL — ABNORMAL HIGH (ref 70–99)

## 2014-11-27 LAB — URIC ACID: Uric Acid, Serum: 9.2 mg/dL — ABNORMAL HIGH (ref 2.4–7.0)

## 2014-11-27 MED ORDER — COLCHICINE 0.6 MG PO TABS: 0.6000 mg | ORAL_TABLET | Freq: Every day | ORAL | Status: DC

## 2014-11-27 NOTE — Assessment & Plan Note (Addendum)
She continues to have pain and swelling in right 2nd and 3rd digits and now there is involvement of the right thumb.  There was decrease in pain and swelling while taking the colchicine which returned once she completed the five day course and then the thumb became swollen about 5 days ago.  She denies fevers/chills, N/V or decreased appetite.  I think this is likely gout (given her hx of gout, CKD, Lasix use) and doubt infectious etiology.   - check uric acid level - continue colchicine 0.6mg  daily (careful given CKD) until symptoms resolve but no more than 14 days; I have asked her to call me if she is still having symptoms still present after 14 days - if still symptomatic will try long oral steroid taper - she will return to see me as previously scheduled in one month (01/01/15) - will wait for resolution of acute gout to consider starting prophylactic allopurinol - She asked me for printout of foods to avoid to prevent gout; I forgot to print this prior to visit ending so I will mail it to her home.

## 2014-11-27 NOTE — Progress Notes (Signed)
Subjective:    Patient ID: Chelsea Mcdaniel, female    DOB: 09/03/1927, 81 y.o.   MRN: [id number]  HPI Comments: Chelsea Mcdaniel is an 81 year old woman with PMH as below here for follow-up of gout.  Please see problem based charting for A&P.    Past Medical History  Diagnosis Date  . Diabetes mellitus   . Hyperlipidemia   . Hypertension   . CVA (cerebrovascular accident)  October 2007     CT of the head atrophy with multiple remote insults noted but no definite acute findings  . Blindness of left eye      likely related to stroke, left cataract removed from that eye with complications  . Anemia      normocytic anemia with baseline hemoglobin 10-11  . Chronic kidney disease 2006     left renal artery stenosis with probable hemodynamic significance, kidneys are normal in morphology without focal lesions or hydronephrosis this is based but cannot A. of the abdomen with and without contrast done on the 31st 2006  . Glaucoma   . Personal history of DVT (deep vein thrombosis) 08/11/2010   Current Outpatient Prescriptions on File Prior to Visit  Medication Sig Dispense Refill  . ACCU-CHEK AVIVA PLUS test strip Use 1 time daily to check blood sugar. 100 each 11  . ACCU-CHEK SOFTCLIX LANCETS lancets Use 1 time daily to check blood sugar. 100 each 1  . acetaminophen (TYLENOL) 325 MG tablet Take 2 tablets (650 mg total) by mouth every 6 (six) hours as needed.    Marland Kitchen amLODipine (NORVASC) 10 MG tablet Take 1 tablet (10 mg total) by mouth daily. For blood pressure 90 tablet 3  . benazepril (LOTENSIN) 40 MG tablet Take 1 tablet (40 mg total) by mouth daily. 90 tablet 3  . Blood Glucose Monitoring Suppl (ACCU-CHEK AVIVA) device Use as instructed 1 each 0  . Cholecalciferol (VITAMIN D) 2000 UNITS tablet Take 1 tablet (2,000 Units total) by mouth daily.    . Cholecalciferol 1000 UNITS TBDP Take 1,000 Units by mouth daily. 30 tablet 3  . colchicine 0.6 MG tablet take 2 tabs at first sign of gout flare,  followed in 1 hour with 1 tab 10 tablet 1  . furosemide (LASIX) 40 MG tablet Take 40mg  in the morning and 20mg  with supper. 90 tablet 1  . glipiZIDE (GLUCOTROL XL) 10 MG 24 hr tablet TAKE ONE TABLET BY MOUTH EVERY DAY 90 tablet 0  . HYDROcodone-acetaminophen (NORCO/VICODIN) 5-325 MG per tablet Take 1 tablet by mouth every 6 (six) hours as needed for moderate pain. 20 tablet 0  . LUMIGAN 0.01 % SOLN Place 1 drop into both eyes every morning.     . metoprolol succinate (TOPROL-XL) 25 MG 24 hr tablet TAKE ONE-HALF TABLET BY MOUTH EVERY DAY 45 tablet 3  . ondansetron (ZOFRAN ODT) 4 MG disintegrating tablet 1 tab sublingual every 6 PRN nausea/vomiting 10 tablet 0  . rosuvastatin (CRESTOR) 5 MG tablet TAKE ONE TABLET BY MOUTH AT BEDTIME 90 tablet 2  . sitaGLIPtin (JANUVIA) 50 MG tablet Take 1 tablet (50 mg total) by mouth daily. 90 tablet 0  . warfarin (COUMADIN) 5 MG tablet Take 1 tablet by mouth daily, except 1.5 mg on Mondays, Wednesdays, and Fridays (Patient taking differently: Take 5-7.5 mg by mouth daily. Take 1 tablet by mouth daily, except 1 and 1/2 tablets on Mondays, Wednesdays, and Fridays) 36 tablet 3   No current facility-administered medications on file prior to  visit.   Review of Systems  Constitutional: Negative for fever, chills, appetite change and unexpected weight change.  Respiratory: Negative for shortness of breath.   Cardiovascular: Negative for chest pain, palpitations and leg swelling.  Gastrointestinal: Positive for constipation. Negative for nausea, vomiting and diarrhea.  Genitourinary: Negative for dysuria and hematuria.  Musculoskeletal: Positive for joint swelling.  Neurological: Negative for syncope and light-headedness.       Filed Vitals:   11/27/14 1516  BP: 109/52  Pulse: 65  Temp: 97.6 F (36.4 C)  TempSrc: Oral  Height: 5\' 2"  (1.575 m)  Weight: 156 lb 4.8 oz (70.897 kg)  SpO2: 100%   Objective:   Physical Exam  Constitutional: She is oriented to  person, place, and time. She appears well-developed. No distress.  HENT:  Head: Normocephalic and atraumatic.  Mouth/Throat: Oropharynx is clear and moist. No oropharyngeal exudate.  Eyes: EOM are normal. Pupils are equal, round, and reactive to light.  Neck: Neck supple.  Cardiovascular: Normal rate, regular rhythm, normal heart sounds and intact distal pulses.  Exam reveals no gallop and no friction rub.   No murmur heard. Pulmonary/Chest: Effort normal and breath sounds normal. No respiratory distress. She has no wheezes. She has no rales.  Abdominal: Soft. Bowel sounds are normal. She exhibits no distension and no mass. There is no tenderness. There is no rebound and no guarding.  Musculoskeletal: Normal range of motion. She exhibits edema and tenderness.  There is swelling of the 1st-3rd digits (thumb and first two fingers) of the right hand.  No erythema.  Mildly increased warmth compared to left hand.  She is tender at PIPs and DIPs of the affected digits.  Minimal reduction in strength and ROM 2/2 to pain.  Right 4th and 5th digits are normal.  Small joints of left hand, wrists, elbows, knees, ankles and small joints of feet appear normal.  Neurological: She is alert and oriented to person, place, and time. No cranial nerve deficit.  Skin: Skin is warm. She is not diaphoretic.  Psychiatric: She has a normal mood and affect. Her behavior is normal.  Vitals reviewed.         Assessment & Plan:  Please see problem based charting for A&P.

## 2014-11-27 NOTE — Patient Instructions (Addendum)
1. You can take 1 colchicine daily until your gout symptoms get better (do not take for more than two weeks).  Call me if symptoms come back after stopping colchicine.  Once your symptoms are gone we can start a new medication to help prevent gout attacks.     2. Please take all medications as prescribed.     3. If you have worsening of your symptoms or new symptoms arise, please call the clinic [phone]), or go to the ER immediately if symptoms are sever  Please come back to see me next month.  Gout Gout is an inflammatory arthritis caused by a buildup of uric acid crystals in the joints. Uric acid is a chemical that is normally present in the blood. When the level of uric acid in the blood is too high it can form crystals that deposit in your joints and tissues. This causes joint redness, soreness, and swelling (inflammation). Repeat attacks are common. Over time, uric acid crystals can form into masses (tophi) near a joint, destroying bone and causing disfigurement. Gout is treatable and often preventable. CAUSES  The disease begins with elevated levels of uric acid in the blood. Uric acid is produced by your body when it breaks down a naturally found substance called purines. Certain foods you eat, such as meats and fish, contain high amounts of purines. Causes of an elevated uric acid level include:  Being passed down from parent to child (heredity).  Diseases that cause increased uric acid production (such as obesity, psoriasis, and certain cancers).  Excessive alcohol use.  Diet, especially diets rich in meat and seafood.  Medicines, including certain cancer-fighting medicines (chemotherapy), water pills (diuretics), and aspirin.  Chronic kidney disease. The kidneys are no longer able to remove uric acid well.  Problems with metabolism. Conditions strongly associated with gout include: 1. Obesity. 2. High blood pressure. 3. High cholesterol. 4. Diabetes. Not everyone with  elevated uric acid levels gets gout. It is not understood why some people get gout and others do not. Surgery, joint injury, and eating too much of certain foods are some of the factors that can lead to gout attacks. SYMPTOMS   An attack of gout comes on quickly. It causes intense pain with redness, swelling, and warmth in a joint.  Fever can occur.  Often, only one joint is involved. Certain joints are more commonly involved:  Base of the big toe.  Knee.  Ankle.  Wrist.  Finger. Without treatment, an attack usually goes away in a few days to weeks. Between attacks, you usually will not have symptoms, which is different from many other forms of arthritis. DIAGNOSIS  Your caregiver will suspect gout based on your symptoms and exam. In some cases, tests may be recommended. The tests may include:  Blood tests.  Urine tests.  X-rays.  Joint fluid exam. This exam requires a needle to remove fluid from the joint (arthrocentesis). Using a microscope, gout is confirmed when uric acid crystals are seen in the joint fluid. TREATMENT  There are two phases to gout treatment: treating the sudden onset (acute) attack and preventing attacks (prophylaxis).  Treatment of an Acute Attack.  Medicines are used. These include anti-inflammatory medicines or steroid medicines.  An injection of steroid medicine into the affected joint is sometimes necessary.  The painful joint is rested. Movement can worsen the arthritis.  You may use warm or cold treatments on painful joints, depending which works best for you.  Treatment to Prevent Attacks.  If you suffer from frequent gout attacks, your caregiver may advise preventive medicine. These medicines are started after the acute attack subsides. These medicines either help your kidneys eliminate uric acid from your body or decrease your uric acid production. You may need to stay on these medicines for a very long time.  The early phase of treatment  with preventive medicine can be associated with an increase in acute gout attacks. For this reason, during the first few months of treatment, your caregiver may also advise you to take medicines usually used for acute gout treatment. Be sure you understand your caregiver's directions. Your caregiver may make several adjustments to your medicine dose before these medicines are effective.  Discuss dietary treatment with your caregiver or dietitian. Alcohol and drinks high in sugar and fructose and foods such as meat, poultry, and seafood can increase uric acid levels. Your caregiver or dietitian can advise you on drinks and foods that should be limited. HOME CARE INSTRUCTIONS   Do not take aspirin to relieve pain. This raises uric acid levels.  Only take over-the-counter or prescription medicines for pain, discomfort, or fever as directed by your caregiver.  Rest the joint as much as possible. When in bed, keep sheets and blankets off painful areas.  Keep the affected joint raised (elevated).  Apply warm or cold treatments to painful joints. Use of warm or cold treatments depends on which works best for you.  Use crutches if the painful joint is in your leg.  Drink enough fluids to keep your urine clear or pale yellow. This helps your body get rid of uric acid. Limit alcohol, sugary drinks, and fructose drinks.  Follow your dietary instructions. Pay careful attention to the amount of protein you eat. Your daily diet should emphasize fruits, vegetables, whole grains, and fat-free or low-fat milk products. Discuss the use of coffee, vitamin C, and cherries with your caregiver or dietitian. These may be helpful in lowering uric acid levels.  Maintain a healthy body weight. SEEK MEDICAL CARE IF:   You develop diarrhea, vomiting, or any side effects from medicines.  You do not feel better in 24 hours, or you are getting worse. SEEK IMMEDIATE MEDICAL CARE IF:   Your joint becomes suddenly more  tender, and you have chills or a fever. MAKE SURE YOU:   Understand these instructions.  Will watch your condition.  Will get help right away if you are not doing well or get worse. Document Released: 07/09/2000 Document Revised: 11/26/2013 Document Reviewed: 02/23/2012 [facility] Patient Information 2015 [place], [place]. This information is not intended to replace advice given to you by your health care provider. Make sure you discuss any questions you have with your health care provider.   Certain foods have been studied for their potential to lower uric acid levels, including:  Coffee.  Studies have found an association between coffee drinking (regular and decaf) and lower uric acid levels.  The evidence is not enough to encourage non-coffee drinkers to start, but it may give clues to new ways of treating gout in the future.  Vitamin C.  Supplements containing vitamin C may reduce the levels of uric acid in your blood.  However, vitamin as a treatment for gout. Don't assume that if a little vitamin C is good, than lots is better.  Megadoses of vitamin C may increase your bodies uric acid levels.  Cherries.  Cherries have been associated with lower levels of uric acid in studies, but it isn't clear if  they have any effect on gout signs and symptoms.  Eating more cherries and other dar-colored fruits, such as blackberries, blueberries, purple grapes and raspberries, may be a safe way to support your gout treatment.    Lifestyle/Diet Recommendations:   Drink 8 to 16 cups ( about 2 to 4 liters) of fluid each day, with at least half being water.  Avoid alcohol  Eat a moderate amount of protein, preferably from healthy sources, such as low-fat or fat-free dairy, tofu, eggs, and nut butters.  Limit you daily intake of meat, fish, and poultry to 4 to 6 ounces.  Avoid high fat meats and desserts.  Decrease you intake of shellfish, beef, lamb, pork, eggs and cheese.  Choose a good source of  vitamin C daily such as citrus fruits, strawberries, broccoli,  brussel sprouts, papaya, and cantaloupe.   Choose a good source of vitamin A every other day such as yellow fruits, or dark green/yellow vegetables.  Avoid drastic weigh reduction or fasting.  If weigh loss is desired lose it over a period of several months.  See "dietary considerations.." chart for specific food recommendations.  Dietary Considerations for people with Gout  Food with negligible purine content (0-15 mg of purine nitrogen per 100 grams food)  May use as desired except on calorie variations  Non fat milk Cocoa Cereals (except in list II) Hard candies  Buttermilk Carbonated drinks Vegetables (except in list II) Sherbet  Coffee Fruits Sugar Honey  Tea Cottage Cheese Gelatin-jell-o Salt  Fruit juice Breads Angel food Cake   Herbs/spices Jams/Jellies [employer]    Foods that do not contain excessive purine content, but must be limited due to fat content  Cream Eggs Oil and Salad Dressing  Half and Half Peanut Butter Chocolate  Whole Milk Cakes Potato Chips  Butter Ice Cream Fried Foods  Cheese Nuts Waffles, pancakes   List II: Food with moderate purine content (50-150 mg of purine nitrogen per 100 grams of food)  Limit total amount each day to 5 oz. cooked Lean meat, other than those on list III   Poultry, other than those on list III Fish, other than those on list III   Seafood, other than those on list III  These foods may be used occasionally  Peas Lentils Bran  Spinach Oatmeal Dried Beans and Peas  Asparagus Wheat Germ Mushrooms   Additional information about meat choices  Choose fish and poultry, particularly without skin, often.  Select lean, well trimmed cuts of meat.  Avoid all fatty meats, bacon , sausage, fried meats, fried fish, or poultry, luncheon meats, cold cuts, hot dogs, meats canned or frozen in gravy, spareribs and frozen and packaged prepared meats.   List III:  Foods with HIGH purine content / Foods to AVOID (150-800 mg of purine nitrogen per 100 grams of food)  Anchovies Herring Meat Broths  Liver Mackerel Meat Extracts  Kidney Scallops Meat Drippings  Sardines Wild Game Mincemeat  Sweetbreads Goose Gravy  Heart Tongue Yeast, baker's and brewers   Commercial soups made with any of the foods listed in List II or List III  In addition avoid all alcoholic beverages

## 2014-11-28 DIAGNOSIS — M109 Gout, unspecified: Secondary | ICD-10-CM | POA: Diagnosis not present

## 2014-11-28 NOTE — Progress Notes (Signed)
Medicine attending: Medical history, presenting problems, physical findings, and medications, reviewed with Dr Alex Wilson on the day of the patient visit   and I concur with her evaluation and management plan. 

## 2014-11-29 DIAGNOSIS — M109 Gout, unspecified: Secondary | ICD-10-CM | POA: Diagnosis not present

## 2014-12-02 ENCOUNTER — Other Ambulatory Visit: Payer: Self-pay | Admitting: Internal Medicine

## 2014-12-02 ENCOUNTER — Ambulatory Visit (INDEPENDENT_AMBULATORY_CARE_PROVIDER_SITE_OTHER): Payer: Medicare Other | Admitting: Pharmacist

## 2014-12-02 DIAGNOSIS — Z7901 Long term (current) use of anticoagulants: Secondary | ICD-10-CM

## 2014-12-02 DIAGNOSIS — Z86718 Personal history of other venous thrombosis and embolism: Secondary | ICD-10-CM

## 2014-12-02 DIAGNOSIS — M109 Gout, unspecified: Secondary | ICD-10-CM | POA: Diagnosis not present

## 2014-12-02 LAB — POCT INR: INR: 2.9

## 2014-12-03 DIAGNOSIS — M109 Gout, unspecified: Secondary | ICD-10-CM | POA: Diagnosis not present

## 2014-12-03 NOTE — Progress Notes (Addendum)
CLINICAL PHARMACIST ANTICOAG NOTE Chelsea Mcdaniel is a 81 y.o. female who reports to the clinic for monitoring of warfarin treatment with her daughter-in-law.   Indication: CVA and history of VTE Duration: indefinite  Anticoagulation Clinic Visit History: Anticoagulation Episode Summary    Current INR goal    Next INR check 12/09/2014   INR from last check 2.5 (11/18/2014)   Most recent INR 2.9 (12/02/2014)   Weekly max dose    Target end date    INR check location    Preferred lab    Send INR reminders to       Comments        ASSESSMENT Recent Results: Recent results are below, the most recent result is correlated with a dose of 42.5 mg per week: Lab Results  Component Value Date   INR 2.9 12/02/2014   INR 2.5 11/18/2014   INR 1.8 10/14/2014    INR today: Therapeutic  Anticoagulation Dosing: INR as of 11/18/2014 and Previous Dosing Information    INR Dt INR Goal [employer] Sun Mon Tue Wed Thu Fri Sat   11/18/2014 2.5 - 42.5 mg 5 mg 7.5 mg 5 mg 7.5 mg 5 mg 7.5 mg 5 mg    Previous description        Changed tablet strength for improved flexibility with dose titration    Anticoagulation Dose Instructions as of 11/18/2014      Total Sun Mon Tue Wed Thu Fri Sat   New Dose 42.5 mg 5 mg 7.5 mg 5 mg 7.5 mg 5 mg 7.5 mg 5 mg     (5 mg x 1)  (5 mg x 1.5)  (5 mg x 1)  (5 mg x 1.5)  (5 mg x 1)  (5 mg x 1.5)  (5 mg x 1)                         Description        Continue same regimen      PLAN Weekly dose was unchanged.  Patient reports experiencing dizziness over the past 2 days which she thinks may be due to medication and requested a medication review. She did not bring medications with her to the visit. Only recent medication initiated is colchicine. Patient is not experiencing dizziness today. She also admits she does not drink enough fluids daily. She was advised to increase fluid intake, monitor symptoms and contact clinic if they persist or worsen. She was also  advised to monitor BP and BG at home especially when she experiences these symptoms.  Follow-up No Follow-up on file.  Flossie Dibble Clinical Pharmacist  30 minutes spent face-to-face with the patient during the encounter. 50% of time spent on education. 50% of time was spent on assessment.

## 2014-12-04 DIAGNOSIS — M109 Gout, unspecified: Secondary | ICD-10-CM | POA: Diagnosis not present

## 2014-12-05 DIAGNOSIS — M109 Gout, unspecified: Secondary | ICD-10-CM | POA: Diagnosis not present

## 2014-12-06 DIAGNOSIS — M109 Gout, unspecified: Secondary | ICD-10-CM | POA: Diagnosis not present

## 2014-12-09 ENCOUNTER — Ambulatory Visit: Payer: Medicare Other

## 2014-12-09 DIAGNOSIS — M109 Gout, unspecified: Secondary | ICD-10-CM | POA: Diagnosis not present

## 2014-12-10 ENCOUNTER — Encounter: Payer: Self-pay | Admitting: Internal Medicine

## 2014-12-10 DIAGNOSIS — M109 Gout, unspecified: Secondary | ICD-10-CM | POA: Diagnosis not present

## 2014-12-10 DIAGNOSIS — M5412 Radiculopathy, cervical region: Secondary | ICD-10-CM | POA: Insufficient documentation

## 2014-12-11 DIAGNOSIS — M109 Gout, unspecified: Secondary | ICD-10-CM | POA: Diagnosis not present

## 2014-12-12 DIAGNOSIS — M109 Gout, unspecified: Secondary | ICD-10-CM | POA: Diagnosis not present

## 2014-12-13 DIAGNOSIS — M109 Gout, unspecified: Secondary | ICD-10-CM | POA: Diagnosis not present

## 2014-12-16 DIAGNOSIS — M109 Gout, unspecified: Secondary | ICD-10-CM | POA: Diagnosis not present

## 2014-12-17 DIAGNOSIS — M109 Gout, unspecified: Secondary | ICD-10-CM | POA: Diagnosis not present

## 2014-12-18 DIAGNOSIS — M109 Gout, unspecified: Secondary | ICD-10-CM | POA: Diagnosis not present

## 2014-12-19 ENCOUNTER — Other Ambulatory Visit: Payer: Self-pay | Admitting: Internal Medicine

## 2014-12-19 DIAGNOSIS — M109 Gout, unspecified: Secondary | ICD-10-CM | POA: Diagnosis not present

## 2014-12-20 DIAGNOSIS — M109 Gout, unspecified: Secondary | ICD-10-CM | POA: Diagnosis not present

## 2014-12-23 DIAGNOSIS — M109 Gout, unspecified: Secondary | ICD-10-CM | POA: Diagnosis not present

## 2014-12-24 DIAGNOSIS — M109 Gout, unspecified: Secondary | ICD-10-CM | POA: Diagnosis not present

## 2014-12-25 DIAGNOSIS — M109 Gout, unspecified: Secondary | ICD-10-CM | POA: Diagnosis not present

## 2014-12-26 DIAGNOSIS — M109 Gout, unspecified: Secondary | ICD-10-CM | POA: Diagnosis not present

## 2014-12-27 DIAGNOSIS — M109 Gout, unspecified: Secondary | ICD-10-CM | POA: Diagnosis not present

## 2014-12-30 DIAGNOSIS — M109 Gout, unspecified: Secondary | ICD-10-CM | POA: Diagnosis not present

## 2014-12-31 DIAGNOSIS — M109 Gout, unspecified: Secondary | ICD-10-CM | POA: Diagnosis not present

## 2015-01-01 ENCOUNTER — Ambulatory Visit (INDEPENDENT_AMBULATORY_CARE_PROVIDER_SITE_OTHER): Payer: Medicare Other | Admitting: Internal Medicine

## 2015-01-01 ENCOUNTER — Encounter: Payer: Self-pay | Admitting: Internal Medicine

## 2015-01-01 VITALS — BP 127/73 | HR 65 | Temp 97.6°F | Ht 62.0 in | Wt 154.6 lb

## 2015-01-01 DIAGNOSIS — E559 Vitamin D deficiency, unspecified: Secondary | ICD-10-CM

## 2015-01-01 DIAGNOSIS — M1A3410 Chronic gout due to renal impairment, right hand, without tophus (tophi): Secondary | ICD-10-CM

## 2015-01-01 DIAGNOSIS — M8580 Other specified disorders of bone density and structure, unspecified site: Secondary | ICD-10-CM

## 2015-01-01 DIAGNOSIS — I10 Essential (primary) hypertension: Secondary | ICD-10-CM | POA: Diagnosis not present

## 2015-01-01 DIAGNOSIS — Z79899 Other long term (current) drug therapy: Secondary | ICD-10-CM | POA: Diagnosis not present

## 2015-01-01 DIAGNOSIS — M109 Gout, unspecified: Secondary | ICD-10-CM | POA: Diagnosis not present

## 2015-01-01 DIAGNOSIS — E118 Type 2 diabetes mellitus with unspecified complications: Secondary | ICD-10-CM

## 2015-01-01 DIAGNOSIS — E119 Type 2 diabetes mellitus without complications: Secondary | ICD-10-CM | POA: Diagnosis not present

## 2015-01-01 DIAGNOSIS — K5900 Constipation, unspecified: Secondary | ICD-10-CM

## 2015-01-01 DIAGNOSIS — M859 Disorder of bone density and structure, unspecified: Secondary | ICD-10-CM

## 2015-01-01 DIAGNOSIS — N289 Disorder of kidney and ureter, unspecified: Secondary | ICD-10-CM | POA: Diagnosis not present

## 2015-01-01 LAB — BASIC METABOLIC PANEL WITH GFR
BUN: 27 mg/dL — ABNORMAL HIGH (ref 6–23)
CALCIUM: 9.1 mg/dL (ref 8.4–10.5)
CO2: 28 meq/L (ref 19–32)
Chloride: 101 mEq/L (ref 96–112)
Creat: 1.73 mg/dL — ABNORMAL HIGH (ref 0.50–1.10)
GFR, Est African American: 31 mL/min — ABNORMAL LOW
GFR, Est Non African American: 27 mL/min — ABNORMAL LOW
GLUCOSE: 226 mg/dL — AB (ref 70–99)
Potassium: 5.1 mEq/L (ref 3.5–5.3)
SODIUM: 138 meq/L (ref 135–145)

## 2015-01-01 LAB — GLUCOSE, CAPILLARY: Glucose-Capillary: 225 mg/dL — ABNORMAL HIGH (ref 65–99)

## 2015-01-01 LAB — URIC ACID: Uric Acid, Serum: 7.9 mg/dL — ABNORMAL HIGH (ref 2.4–7.0)

## 2015-01-01 LAB — POCT GLYCOSYLATED HEMOGLOBIN (HGB A1C): Hemoglobin A1C: 6.9

## 2015-01-01 LAB — PHOSPHORUS: PHOSPHORUS: 3.2 mg/dL (ref 2.3–4.6)

## 2015-01-01 MED ORDER — FUROSEMIDE 20 MG PO TABS: 20.0000 mg | ORAL_TABLET | Freq: Two times a day (BID) | ORAL | Status: DC

## 2015-01-01 NOTE — Patient Instructions (Signed)
1. I have decreased your Lasix.  Please take 20mg  in the morning and 20mg  in the evening.  Please stop taking Ex-lax for now and see if your bowel habits return to normal.  Try increasing water and fiber in your diet (there are some tips below).  If you are still having constipation, you can try colace as needed if you are needing it every day please let me know.   2. Please take all medications as prescribed.    3. If you have worsening of your symptoms or new symptoms arise, please call the clinic [phone]), or go to the ER immediately if symptoms are severe.   Constipation Constipation is when a person has fewer than three bowel movements a week, has difficulty having a bowel movement, or has stools that are dry, hard, or larger than normal. As people grow older, constipation is more common. If you try to fix constipation with medicines that make you have a bowel movement (laxatives), the problem may get worse. Long-term laxative use may cause the muscles of the colon to become weak. A low-fiber diet, not taking in enough fluids, and taking certain medicines may make constipation worse.  CAUSES   Certain medicines, such as antidepressants, pain medicine, iron supplements, antacids, and water pills.   Certain diseases, such as diabetes, irritable bowel syndrome (IBS), thyroid disease, or depression.   Not drinking enough water.   Not eating enough fiber-rich foods.   Stress or travel.   Lack of physical activity or exercise.   Ignoring the urge to have a bowel movement.   Using laxatives too much.  SIGNS AND SYMPTOMS   Having fewer than three bowel movements a week.   Straining to have a bowel movement.   Having stools that are hard, dry, or larger than normal.   Feeling full or bloated.   Pain in the lower abdomen.   Not feeling relief after having a bowel movement.  DIAGNOSIS  Your health care provider will take a medical history and perform a physical  exam. Further testing may be done for severe constipation. Some tests may include:  A barium enema X-ray to examine your rectum, colon, and, sometimes, your small intestine.   A sigmoidoscopy to examine your lower colon.   A colonoscopy to examine your entire colon. TREATMENT  Treatment will depend on the severity of your constipation and what is causing it. Some dietary treatments include drinking more fluids and eating more fiber-rich foods. Lifestyle treatments may include regular exercise. If these diet and lifestyle recommendations do not help, your health care provider may recommend taking over-the-counter laxative medicines to help you have bowel movements. Prescription medicines may be prescribed if over-the-counter medicines do not work.  HOME CARE INSTRUCTIONS   Eat foods that have a lot of fiber, such as fruits, vegetables, whole grains, and beans.  Limit foods high in fat and processed sugars, such as french fries, hamburgers, cookies, candies, and soda.   A fiber supplement may be added to your diet if you cannot get enough fiber from foods.   Drink enough fluids to keep your urine clear or pale yellow.   Exercise regularly or as directed by your health care provider.   Go to the restroom when you have the urge to go. Do not hold it.   Only take over-the-counter or prescription medicines as directed by your health care provider. Do not take other medicines for constipation without talking to your health care provider first.  SEEK IMMEDIATE  MEDICAL CARE IF:   You have bright red blood in your stool.   Your constipation lasts for more than 4 days or gets worse.   You have abdominal or rectal pain.   You have thin, pencil-like stools.   You have unexplained weight loss. MAKE SURE YOU:   Understand these instructions.  Will watch your condition.  Will get help right away if you are not doing well or get worse. Document Released: 04/09/2004 Document  Revised: 07/17/2013 Document Reviewed: 04/23/2013 [facility] Patient Information 2015 [place], [place]. This information is not intended to replace advice given to you by your health care provider. Make sure you discuss any questions you have with your health care provider.

## 2015-01-01 NOTE — Assessment & Plan Note (Signed)
Lab Results  Component Value Date   HGBA1C 6.9 01/01/2015   HGBA1C 6.9 09/25/2014   HGBA1C 6.6 06/12/2014     Assessment: Diabetes control:  well controlled Progress toward A1C goal:   at goal Comments: Compliant with medications.  She admits to dietary indiscretions of regular soda 2-3 twelve oz cans per week and daily sweet tea.  Plan: Medications:  continue current medications:  Glipizide 10mg  daily and Januvia 50mg  daily (CrCl> 30 but will keep a close eye on it: if it drops below 30, will need dose decreased to 25mg  daily) Home glucose monitoring: twice daily Frequency:   Timing:   Instruction/counseling given: discussed diet Other plans: RTC in 3 months.

## 2015-01-01 NOTE — Assessment & Plan Note (Addendum)
BP Readings from Last 3 Encounters:  01/01/15 127/73  11/27/14 109/52  11/05/14 160/60    Lab Results  Component Value Date   NA 138 11/27/2014   K 4.4 11/27/2014   CREATININE 1.49* 11/27/2014    Assessment: Blood pressure control:  well controlled Progress toward BP goal:   at goal Comments: Compliant with meds.  Plan: Medications:  continue current medications:  Amlodipine 10mg  daily, benazepril 40mg  daily, Toprol XL 25mg  daily, DECREASE Lasix from 40mg AM/ 20mg PM to 20mg  BID (this may help with the gout and constipation) Other plans: RTC in 3 months

## 2015-01-01 NOTE — Assessment & Plan Note (Signed)
Right third PIP still swollen but only minimally TTP.  Other joint swelling has resolved.  She says her pain improved so she did not have to take the complete two week course of colchicine.  Uric acid is elevated but she is feeling better, reports about two flairs per year.  Will hold off on introducing allopurinol (she still has a tender/swollen joint so would not initiate during this acute phase anyway).  Will continue to monitor and consider addition of allopurinol once symptoms completely resolved.

## 2015-01-01 NOTE — Assessment & Plan Note (Addendum)
x 2 months.  No other GI symptoms.  Benign abd exam.  She reports drinking sweet tea.  She does not like water.  She says she must use ex-lax twice per week to have a normal BM.   - I stressed importance of increased fiber and water - light exercise as tolerated (i.e. walking) - STOP ex-lax and see if bowel habits normalize with above changes - colace prn; she will call me if she needs to use colace daily - decrease Lasix - declined colonoscopy in the past - follow-up symptoms next visit

## 2015-01-01 NOTE — Progress Notes (Signed)
Subjective:    Patient ID: Chelsea Mcdaniel, female    DOB: 04-02-1928, 81 y.o.   MRN: [id number]  HPI Comments: Chelsea Mcdaniel is an 81 year old woman with PMH as below here for follow-up of gout. Please see problem based charting for A&P.    Past Medical History  Diagnosis Date  . Diabetes mellitus   . Hyperlipidemia   . Hypertension   . CVA (cerebrovascular accident)  October 2007     CT of the head atrophy with multiple remote insults noted but no definite acute findings  . Blindness of left eye      likely related to stroke, left cataract removed from that eye with complications  . Anemia      normocytic anemia with baseline hemoglobin 10-11  . Chronic kidney disease 2006     left renal artery stenosis with probable hemodynamic significance, kidneys are normal in morphology without focal lesions or hydronephrosis this is based but cannot A. of the abdomen with and without contrast done on the 31st 2006  . Glaucoma   . Personal history of DVT (deep vein thrombosis) 08/11/2010   Current Outpatient Prescriptions on File Prior to Visit  Medication Sig Dispense Refill  . ACCU-CHEK AVIVA PLUS test strip Use 1 time daily to check blood sugar. 100 each 11  . ACCU-CHEK SOFTCLIX LANCETS lancets Use 1 time daily to check blood sugar. 100 each 1  . acetaminophen (TYLENOL) 325 MG tablet Take 2 tablets (650 mg total) by mouth every 6 (six) hours as needed.    Marland Kitchen amLODipine (NORVASC) 10 MG tablet Take 1 tablet (10 mg total) by mouth daily. For blood pressure 90 tablet 3  . benazepril (LOTENSIN) 40 MG tablet Take 1 tablet (40 mg total) by mouth daily. 90 tablet 3  . Blood Glucose Monitoring Suppl (ACCU-CHEK AVIVA) device Use as instructed 1 each 0  . Calcium Carbonate-Vitamin D 600-400 MG-UNIT per tablet Take 1 tablet by mouth 2 (two) times daily.    . colchicine 0.6 MG tablet Take 1 tablet (0.6 mg total) by mouth daily. 14 tablet 0  . CRESTOR 5 MG tablet take 1 tablet by mouth at bedtime 90 tablet  1  . furosemide (LASIX) 40 MG tablet Take 40mg  in the morning and 20mg  with supper. 90 tablet 1  . glipiZIDE (GLUCOTROL XL) 10 MG 24 hr tablet TAKE ONE TABLET BY MOUTH EVERY DAY 90 tablet 0  . HYDROcodone-acetaminophen (NORCO/VICODIN) 5-325 MG per tablet Take 1 tablet by mouth every 6 (six) hours as needed for moderate pain. 20 tablet 0  . LUMIGAN 0.01 % SOLN Place 1 drop into both eyes every morning.     . metoprolol succinate (TOPROL-XL) 25 MG 24 hr tablet TAKE ONE-HALF TABLET BY MOUTH EVERY DAY 45 tablet 3  . sitaGLIPtin (JANUVIA) 50 MG tablet Take 1 tablet (50 mg total) by mouth daily. 90 tablet 0  . warfarin (COUMADIN) 5 MG tablet Take 1 tablet by mouth daily, except 1.5 mg on Mondays, Wednesdays, and Fridays (Patient taking differently: Take 5-7.5 mg by mouth daily. Take 1 tablet by mouth daily, except 1 and 1/2 tablets on Mondays, Wednesdays, and Fridays) 36 tablet 3   No current facility-administered medications on file prior to visit.    Review of Systems  Constitutional: Negative for fever, chills, appetite change and unexpected weight change.  HENT: Negative for hearing loss.   Eyes: Negative for visual disturbance.  Respiratory: Negative for cough and shortness of breath.  Cardiovascular: Positive for leg swelling. Negative for chest pain and palpitations.       Occasional ankle swelling  Gastrointestinal: Positive for constipation. Negative for nausea, vomiting, abdominal pain, diarrhea and blood in stool.  Genitourinary: Negative for dysuria, hematuria, vaginal bleeding and difficulty urinating.  Neurological: Negative for syncope, light-headedness and headaches.  Psychiatric/Behavioral: Negative for dysphoric mood.       Filed Vitals:   01/01/15 1327  BP: 127/73  Pulse: 65  Temp: 97.6 F (36.4 C)  TempSrc: Oral  Height: 5\' 2"  (1.575 m)  Weight: 154 lb 9.6 oz (70.126 kg)  SpO2: 100%    Objective:   Physical Exam  Constitutional: She is oriented to person, place,  and time. She appears well-developed. No distress.  HENT:  Head: Normocephalic and atraumatic.  Mouth/Throat: No oropharyngeal exudate.  Eyes: EOM are normal.  Neck: Neck supple.  Cardiovascular: Normal rate, regular rhythm and normal heart sounds.  Exam reveals no gallop and no friction rub.   No murmur heard. Pulmonary/Chest: Effort normal and breath sounds normal. No respiratory distress. She has no wheezes. She has no rales.  Abdominal: Soft. Bowel sounds are normal. She exhibits no distension and no mass. There is no tenderness. There is no rebound and no guarding.  Musculoskeletal: Normal range of motion. She exhibits edema. She exhibits no tenderness.  Third digit of right hand swollen at PIP.  Non-tender to palpation.  Other small joints of the hand are normal.  Neurological: She is alert and oriented to person, place, and time. No cranial nerve deficit.  Skin: Skin is warm. She is not diaphoretic.  Psychiatric: She has a normal mood and affect. Her behavior is normal. Judgment and thought content normal.  Vitals reviewed.         Assessment & Plan:  Please see problem based charting for A&P.

## 2015-01-02 DIAGNOSIS — M109 Gout, unspecified: Secondary | ICD-10-CM | POA: Diagnosis not present

## 2015-01-03 ENCOUNTER — Other Ambulatory Visit: Payer: Self-pay | Admitting: Internal Medicine

## 2015-01-03 DIAGNOSIS — M109 Gout, unspecified: Secondary | ICD-10-CM | POA: Diagnosis not present

## 2015-01-03 DIAGNOSIS — R7989 Other specified abnormal findings of blood chemistry: Secondary | ICD-10-CM

## 2015-01-03 NOTE — Progress Notes (Signed)
Internal Medicine Clinic Attending  Case discussed with Dr. Wilson at the time of the visit.  We reviewed the resident's history and exam and pertinent patient test results.  I agree with the assessment, diagnosis, and plan of care documented in the resident's note.  

## 2015-01-04 DIAGNOSIS — M109 Gout, unspecified: Secondary | ICD-10-CM | POA: Diagnosis not present

## 2015-01-04 LAB — VITAMIN D 1,25 DIHYDROXY
VITAMIN D 1, 25 (OH) TOTAL: 35 pg/mL (ref 18–72)
VITAMIN D3 1, 25 (OH): 35 pg/mL

## 2015-01-06 ENCOUNTER — Ambulatory Visit (INDEPENDENT_AMBULATORY_CARE_PROVIDER_SITE_OTHER): Payer: Medicare Other | Admitting: Pharmacist

## 2015-01-06 ENCOUNTER — Ambulatory Visit: Payer: Medicare Other

## 2015-01-06 ENCOUNTER — Other Ambulatory Visit (INDEPENDENT_AMBULATORY_CARE_PROVIDER_SITE_OTHER): Payer: Medicare Other

## 2015-01-06 DIAGNOSIS — R748 Abnormal levels of other serum enzymes: Secondary | ICD-10-CM

## 2015-01-06 DIAGNOSIS — R7989 Other specified abnormal findings of blood chemistry: Secondary | ICD-10-CM

## 2015-01-06 DIAGNOSIS — Z8673 Personal history of transient ischemic attack (TIA), and cerebral infarction without residual deficits: Secondary | ICD-10-CM | POA: Diagnosis not present

## 2015-01-06 DIAGNOSIS — Z7901 Long term (current) use of anticoagulants: Secondary | ICD-10-CM | POA: Diagnosis not present

## 2015-01-06 DIAGNOSIS — M109 Gout, unspecified: Secondary | ICD-10-CM | POA: Diagnosis not present

## 2015-01-06 LAB — POCT INR: INR: 3.2

## 2015-01-06 NOTE — Patient Instructions (Signed)
Patient instructed to take medications as defined in the Anti-coagulation Track section of this encounter.  Patient instructed to take today's dose.  Patient verbalized understanding of these instructions.    

## 2015-01-06 NOTE — Progress Notes (Signed)
Anti-Coagulation Progress Note  Chelsea Mcdaniel is a 81 y.o. female who is currently on an anti-coagulation regimen.    RECENT RESULTS: Recent results are below, the most recent result is correlated with a dose of 42.5 mg. per week: Lab Results  Component Value Date   INR 3.20 01/06/2015   INR 2.9 12/02/2014   INR 2.5 11/18/2014    ANTI-COAG DOSE: Anticoagulation Dose Instructions as of 01/06/2015      [name] Tue Wed Thu Fri Sat   New Dose 5 mg 7.5 mg 5 mg 5 mg 7.5 mg 5 mg 5 mg    Description        Continue same regimen       ANTICOAG SUMMARY: Anticoagulation Episode Summary    Current INR goal    Next INR check 02/03/2015   INR from last check 3.20 (01/06/2015)   Weekly max dose    Target end date    INR check location    Preferred lab    Send INR reminders to       Comments         ANTICOAG TODAY: Anticoagulation Summary as of 01/06/2015    INR goal    Selected INR 3.20 (01/06/2015)   Next INR check 02/03/2015   Target end date     Anticoagulation Episode Summary    INR check location    Preferred lab    Send INR reminders to    Comments       PATIENT INSTRUCTIONS: Patient Instructions  Patient instructed to take medications as defined in the Anti-coagulation Track section of this encounter.  Patient instructed to take today's dose.  Patient verbalized understanding of these instructions.       FOLLOW-UP Return in 4 weeks (on 02/03/2015) for Follow up INR at 2:15PM.  [name], III Pharm.D., CACP

## 2015-01-07 ENCOUNTER — Telehealth: Payer: Self-pay | Admitting: *Deleted

## 2015-01-07 DIAGNOSIS — M109 Gout, unspecified: Secondary | ICD-10-CM | POA: Diagnosis not present

## 2015-01-07 LAB — COMPLETE METABOLIC PANEL WITH GFR
ALT: 12 U/L (ref 0–35)
AST: 20 U/L (ref 0–37)
Albumin: 3.9 g/dL (ref 3.5–5.2)
Alkaline Phosphatase: 111 U/L (ref 39–117)
BUN: 34 mg/dL — AB (ref 6–23)
CO2: 26 mEq/L (ref 19–32)
CREATININE: 2.2 mg/dL — AB (ref 0.50–1.10)
Calcium: 9.2 mg/dL (ref 8.4–10.5)
Chloride: 102 mEq/L (ref 96–112)
GFR, EST NON AFRICAN AMERICAN: 20 mL/min — AB
GFR, Est African American: 23 mL/min — ABNORMAL LOW
Glucose, Bld: 222 mg/dL — ABNORMAL HIGH (ref 70–99)
Potassium: 5 mEq/L (ref 3.5–5.3)
Sodium: 139 mEq/L (ref 135–145)
Total Bilirubin: 0.7 mg/dL (ref 0.2–1.2)
Total Protein: 7 g/dL (ref 6.0–8.3)

## 2015-01-07 NOTE — Telephone Encounter (Signed)
Call to patient to notify her of need to stop and change doses of medications. Due to Kidney function problems.  Spoke with patient who said that see was unable to see well and to call her Daughter-in-law [name] who does her medications.  [name] was call and informed that due to kidney problems shown through recent labs patient would need to stop Benazepril and Lasix medications.  Patient will also need to take 1/2 tablet of her Januvia .  Daughter-in law voiced understanding and repeated changes.  Patient was also given an appointment for 01/08/2015 at 1:45 PM in the Clinics.  Daughter-in-law to bring patient.  Dr. Redmond Pulling was called and informed.  Sander Nephew, RN 01/07/2015 1:57 PM.

## 2015-01-08 ENCOUNTER — Encounter: Payer: Self-pay | Admitting: Internal Medicine

## 2015-01-08 ENCOUNTER — Ambulatory Visit: Payer: Medicare Other | Admitting: Internal Medicine

## 2015-01-08 ENCOUNTER — Ambulatory Visit (INDEPENDENT_AMBULATORY_CARE_PROVIDER_SITE_OTHER): Payer: Medicare Other | Admitting: Internal Medicine

## 2015-01-08 VITALS — BP 146/61 | HR 66 | Temp 97.5°F | Wt 154.7 lb

## 2015-01-08 DIAGNOSIS — E1122 Type 2 diabetes mellitus with diabetic chronic kidney disease: Secondary | ICD-10-CM

## 2015-01-08 DIAGNOSIS — N183 Chronic kidney disease, stage 3 unspecified: Secondary | ICD-10-CM

## 2015-01-08 DIAGNOSIS — K5900 Constipation, unspecified: Secondary | ICD-10-CM | POA: Diagnosis not present

## 2015-01-08 DIAGNOSIS — I129 Hypertensive chronic kidney disease with stage 1 through stage 4 chronic kidney disease, or unspecified chronic kidney disease: Secondary | ICD-10-CM

## 2015-01-08 DIAGNOSIS — K5901 Slow transit constipation: Secondary | ICD-10-CM

## 2015-01-08 DIAGNOSIS — I10 Essential (primary) hypertension: Secondary | ICD-10-CM

## 2015-01-08 DIAGNOSIS — M109 Gout, unspecified: Secondary | ICD-10-CM | POA: Diagnosis not present

## 2015-01-08 LAB — URINALYSIS, ROUTINE W REFLEX MICROSCOPIC
GLUCOSE, UA: 500 mg/dL — AB
Hgb urine dipstick: NEGATIVE
KETONES UR: NEGATIVE mg/dL
Nitrite: NEGATIVE
PH: 5 (ref 5.0–8.0)
Protein, ur: 100 mg/dL — AB
Specific Gravity, Urine: 1.023 (ref 1.005–1.030)
Urobilinogen, UA: 1 mg/dL (ref 0.0–1.0)

## 2015-01-08 LAB — URINE MICROSCOPIC-ADD ON

## 2015-01-08 LAB — BASIC METABOLIC PANEL
ANION GAP: 9 (ref 5–15)
BUN: 32 mg/dL — ABNORMAL HIGH (ref 6–20)
CO2: 23 mmol/L (ref 22–32)
CREATININE: 1.82 mg/dL — AB (ref 0.44–1.00)
Calcium: 9.1 mg/dL (ref 8.9–10.3)
Chloride: 103 mmol/L (ref 101–111)
GFR calc non Af Amer: 24 mL/min — ABNORMAL LOW (ref >60)
GFR, EST AFRICAN AMERICAN: 28 mL/min — AB (ref >60)
Glucose, Bld: 340 mg/dL — ABNORMAL HIGH (ref 65–99)
Potassium: 4.6 mmol/L (ref 3.5–5.1)
Sodium: 135 mmol/L (ref 135–145)

## 2015-01-08 MED ORDER — SITAGLIPTIN PHOSPHATE 25 MG PO TABS: 25.0000 mg | ORAL_TABLET | Freq: Every day | ORAL | Status: DC

## 2015-01-08 MED ORDER — SITAGLIPTIN PHOSPHATE 25 MG PO TABS: 50.0000 mg | ORAL_TABLET | Freq: Every day | ORAL | Status: DC

## 2015-01-08 MED ORDER — SENNOSIDES-DOCUSATE SODIUM 8.6-50 MG PO TABS: 1.0000 | ORAL_TABLET | Freq: Every evening | ORAL | Status: DC | PRN

## 2015-01-08 MED ORDER — SIMETHICONE 80 MG PO CHEW: 80.0000 mg | CHEWABLE_TABLET | Freq: Four times a day (QID) | ORAL | Status: DC | PRN

## 2015-01-08 NOTE — Assessment & Plan Note (Signed)
Lab Results  Component Value Date   HGBA1C 6.9 01/01/2015   HGBA1C 6.9 09/25/2014   HGBA1C 6.6 06/12/2014     Assessment: Diabetes control:  CBG elevated today, otherwise diabetes control is good.  Comments: Given renal function, needs decrease in Januvia  Plan: Medications:  continue current medications, Decrease Januvia to 25 mg daily. Continue Glipizide 10 mg daily.  Instruction/counseling given: discussed diet Other plans: RTC in 1 week. May need adjustment of medication to further compensate for decreased dose of Januvia.

## 2015-01-08 NOTE — Progress Notes (Signed)
Subjective:   Patient ID: Chelsea Mcdaniel female   DOB: 11-04-1927 81 y.o.   MRN: [id number]  HPI: Chelsea Mcdaniel is a 81 y.o. female w/ PMHx of HTN, HLD, DM type II, h/o CVA, anemia, and known CKD, presents to the clinic today for an acute visit regarding worsening renal function. Patient had BMP drawn on 01/06/15 which showed Cr of 2.20, baseline closer to 1.4-1.6. BUN also elevated at 34. Today, patient says she feels okay, she claims to be having some intermittent abdominal pain that comes on rapidly and subsides quickly as well. She describes it as a cramping type pain. She otherwise says she typically does not drink enough water throughout the day and says that her urine is sometimes somewhat concentrated, but otherwise pretty clear. No significant dark or cloudy urine that she can tell. No flank pain that she has noted and no difficulty urinating. She does admit to some dizziness and lightheadedness with standing. Since her Cr was found to be elevated, her Lasix and Benazepril has been held by her PCP yesterday . She denies any fever, chills, SOB, chest pain, nausea, vomiting, or chest pain. She also denies taking any NSAIDS or recent antibiotics.    Current Outpatient Prescriptions  Medication Sig Dispense Refill  . ACCU-CHEK AVIVA PLUS test strip Use 1 time daily to check blood sugar. 100 each 11  . ACCU-CHEK SOFTCLIX LANCETS lancets Use 1 time daily to check blood sugar. 100 each 1  . acetaminophen (TYLENOL) 325 MG tablet Take 2 tablets (650 mg total) by mouth every 6 (six) hours as needed.    Marland Kitchen amLODipine (NORVASC) 10 MG tablet Take 1 tablet (10 mg total) by mouth daily. For blood pressure 90 tablet 3  . benazepril (LOTENSIN) 40 MG tablet Take 1 tablet (40 mg total) by mouth daily. 90 tablet 3  . Blood Glucose Monitoring Suppl (ACCU-CHEK AVIVA) device Use as instructed 1 each 0  . Calcium Carbonate-Vitamin D 600-400 MG-UNIT per tablet Take 1 tablet by mouth 2 (two) times daily.    .  colchicine 0.6 MG tablet Take 1 tablet (0.6 mg total) by mouth daily. 14 tablet 0  . CRESTOR 5 MG tablet take 1 tablet by mouth at bedtime 90 tablet 1  . furosemide (LASIX) 20 MG tablet Take 1 tablet (20 mg total) by mouth 2 (two) times daily. 60 tablet 2  . glipiZIDE (GLUCOTROL XL) 10 MG 24 hr tablet TAKE ONE TABLET BY MOUTH EVERY DAY 90 tablet 0  . HYDROcodone-acetaminophen (NORCO/VICODIN) 5-325 MG per tablet Take 1 tablet by mouth every 6 (six) hours as needed for moderate pain. 20 tablet 0  . LUMIGAN 0.01 % SOLN Place 1 drop into both eyes every morning.     . metoprolol succinate (TOPROL-XL) 25 MG 24 hr tablet TAKE ONE-HALF TABLET BY MOUTH EVERY DAY 45 tablet 3  . senna-docusate (SENOKOT-S) 8.6-50 MG per tablet Take 1 tablet by mouth at bedtime as needed for mild constipation. 30 tablet 2  . simethicone (GAS-X) 80 MG chewable tablet Chew 1 tablet (80 mg total) by mouth every 6 (six) hours as needed for flatulence. 30 tablet 0  . sitaGLIPtin (JANUVIA) 50 MG tablet Take 1 tablet (50 mg total) by mouth daily. 90 tablet 0  . warfarin (COUMADIN) 5 MG tablet Take 1 tablet by mouth daily, except 1.5 mg on Mondays, Wednesdays, and Fridays (Patient taking differently: Take 5-7.5 mg by mouth daily. Take 1 tablet by mouth daily, except 1  and 1/2 tablets on Mondays, Wednesdays, and Fridays) 36 tablet 3   No current facility-administered medications for this visit.   Review of Systems  General: Denies fever, diaphoresis, appetite change, and fatigue.  Respiratory: Denies SOB, cough, and wheezing.   Cardiovascular: Denies chest pain and palpitations.  Gastrointestinal: Positive for intermittent abdominal pain. Denies nausea, vomiting, and diarrhea Musculoskeletal: Denies myalgias, arthralgias, back pain, and gait problem.  Neurological: Positive for intermittent dizziness and lightheadedness. Denies syncope, weakness, and headaches.  Psychiatric/Behavioral: Denies mood changes, sleep disturbance, and  agitation.   Objective:   Physical Exam: Filed Vitals:   01/08/15 1415  BP: 146/61  Pulse: 66  Temp: 97.5 F (36.4 C)  TempSrc: Oral  Weight: 154 lb 11.2 oz (70.171 kg)  SpO2: 100%    General: Elderly AA female, alert, cooperative, NAD. HEENT: PERRL, EOMI. Moist mucus membranes Neck: Full range of motion without pain, supple, no lymphadenopathy or carotid bruits Lungs: Clear to ascultation bilaterally, normal work of respiration, no wheezes, rales, rhonchi Heart: RRR, no murmurs, gallops, or rubs Abdomen: Soft, non-tender, non-distended, BS + Extremities: No cyanosis, clubbing, or edema Neurologic: Alert & oriented x3, cranial nerves II-XII intact, strength grossly intact, sensation intact to light touch   Assessment & Plan:   Please see problem based assessment and plan.

## 2015-01-08 NOTE — Assessment & Plan Note (Signed)
Recent Cr shown to have increased to 2.20 from her baseline which is closer to 1.6. PCP called patient yesterday, told her to hold her ACEI and Lasix. Suspect this is most likely pre-renal AKI as BUN was also quite elevated. States she does not drink much water, unclear as to why she was ever started on Lasix (BP control?). No obvious findings to suggest CHF. No peripheral edema. Repeat BMP shows Cr of 1.88 today. UA shows 100 protein.  -Continue to hold ACEI. Would hold Lasix indefinitely. -Advised adequate hydration.  -See HTN section for BP control -RTC in 1 week for repeat BMP

## 2015-01-08 NOTE — Assessment & Plan Note (Signed)
BP Readings from Last 3 Encounters:  01/08/15 146/61  01/01/15 127/73  11/27/14 109/52    Lab Results  Component Value Date   NA 135 01/08/2015   K 4.6 01/08/2015   CREATININE 1.82* 01/08/2015    Assessment: Blood pressure control:  Mildly elevated.  Comments: Lasix and Benazepril have been held 2/2 recent increase in Cr, therefore BP has increased slightly today. Patient still taking Norvasc 10 mg daily + Toprol-XL 25 mg daily.   Plan: Medications:  continue current medications. Continue to hold Lasix, Benazepril. Would completely forego restarting Lasix as patient has no clear reason for being on this. Most recent ECHO from 2008 shows normal EF. No edema on exam. Continue Norvasc + Toprol-XL.  Other plans: RTC next week. If renal function improves at next visit, can consider restarting lower dose ACEI.

## 2015-01-08 NOTE — Patient Instructions (Signed)
1. Please schedule a follow up appointment for early next week.   2. Please take all medications as previously prescribed with the following changes:  Continue to HOLD Lasix and Benazepril until you are seen in the clinic.   Take Simethicone for gas pain. STOP using Ex-Lax. Use Colace prn for constipation. Also use OTC fiber supplement.   PLEASE DRINK LOTS OF WATER!  3. If you have worsening of your symptoms or new symptoms arise, please call the clinic [phone]), or go to the ER immediately if symptoms are severe.  You have done a great job in taking all your medications. Please continue to do this.

## 2015-01-08 NOTE — Assessment & Plan Note (Signed)
Patient still w/ constipation and gas pain today. Patient describes a crampy pain with rapid onset and offset. Says she is using OTC Ex-Lax. Last bowel movement on Monday (2 days ago).  -Will give Simethicone prn for ags pain -Can try Senokot-S; addition of stool softener will likely benfit her.  -Advised increased water intake as it seems that her constipation is largely driven by dehydration.  -Also advised OTC fiber supplement (benefiber, metamucil, etc.)

## 2015-01-09 DIAGNOSIS — M109 Gout, unspecified: Secondary | ICD-10-CM | POA: Diagnosis not present

## 2015-01-09 NOTE — Addendum Note (Signed)
Addended by: Gilles Chiquito B on: 01/09/2015 11:10 AM   Modules accepted: Level of Service

## 2015-01-09 NOTE — Progress Notes (Signed)
Internal Medicine Clinic Attending  Case discussed with Dr. Jones at the time of the visit.  We reviewed the resident's history and exam and pertinent patient test results.  I agree with the assessment, diagnosis, and plan of care documented in the resident's note.  

## 2015-01-10 DIAGNOSIS — M109 Gout, unspecified: Secondary | ICD-10-CM | POA: Diagnosis not present

## 2015-01-11 DIAGNOSIS — M109 Gout, unspecified: Secondary | ICD-10-CM | POA: Diagnosis not present

## 2015-01-13 DIAGNOSIS — M109 Gout, unspecified: Secondary | ICD-10-CM | POA: Diagnosis not present

## 2015-01-14 ENCOUNTER — Encounter: Payer: Self-pay | Admitting: Internal Medicine

## 2015-01-14 ENCOUNTER — Ambulatory Visit (INDEPENDENT_AMBULATORY_CARE_PROVIDER_SITE_OTHER): Payer: Medicare Other | Admitting: Internal Medicine

## 2015-01-14 VITALS — BP 163/72 | HR 78 | Temp 97.6°F | Ht 62.0 in | Wt 156.4 lb

## 2015-01-14 DIAGNOSIS — N183 Chronic kidney disease, stage 3 unspecified: Secondary | ICD-10-CM

## 2015-01-14 DIAGNOSIS — I129 Hypertensive chronic kidney disease with stage 1 through stage 4 chronic kidney disease, or unspecified chronic kidney disease: Secondary | ICD-10-CM

## 2015-01-14 DIAGNOSIS — M109 Gout, unspecified: Secondary | ICD-10-CM | POA: Diagnosis not present

## 2015-01-14 DIAGNOSIS — I10 Essential (primary) hypertension: Secondary | ICD-10-CM

## 2015-01-14 LAB — BASIC METABOLIC PANEL
Anion gap: 9 (ref 5–15)
BUN: 19 mg/dL (ref 6–20)
CALCIUM: 9.1 mg/dL (ref 8.9–10.3)
CO2: 23 mmol/L (ref 22–32)
Chloride: 103 mmol/L (ref 101–111)
Creatinine, Ser: 1.25 mg/dL — ABNORMAL HIGH (ref 0.44–1.00)
GFR calc Af Amer: 44 mL/min — ABNORMAL LOW (ref >60)
GFR calc non Af Amer: 38 mL/min — ABNORMAL LOW (ref >60)
Glucose, Bld: 256 mg/dL — ABNORMAL HIGH (ref 65–99)
Potassium: 4.8 mmol/L (ref 3.5–5.1)
Sodium: 135 mmol/L (ref 135–145)

## 2015-01-14 NOTE — Progress Notes (Signed)
Subjective:   Patient ID: Chelsea Mcdaniel female   DOB: 1928-01-06 81 y.o.   MRN: [id number]  HPI: Chelsea Mcdaniel is a 81 y.o. female w/ PMHx of HTN, HLD, DM type II, h/o CVA, anemia, and known CKD, presents to the clinic today for a follow up visit regarding her AKI. Patient states she feels great today, absolutely no complaints. Did not take her BP medications today, BP elevated.    Current Outpatient Prescriptions  Medication Sig Dispense Refill  . ACCU-CHEK AVIVA PLUS test strip Use 1 time daily to check blood sugar. 100 each 11  . ACCU-CHEK SOFTCLIX LANCETS lancets Use 1 time daily to check blood sugar. 100 each 1  . acetaminophen (TYLENOL) 325 MG tablet Take 2 tablets (650 mg total) by mouth every 6 (six) hours as needed.    Marland Kitchen amLODipine (NORVASC) 10 MG tablet Take 1 tablet (10 mg total) by mouth daily. For blood pressure 90 tablet 3  . benazepril (LOTENSIN) 40 MG tablet Take 1 tablet (40 mg total) by mouth daily. 90 tablet 3  . Blood Glucose Monitoring Suppl (ACCU-CHEK AVIVA) device Use as instructed 1 each 0  . Calcium Carbonate-Vitamin D 600-400 MG-UNIT per tablet Take 1 tablet by mouth 2 (two) times daily.    . colchicine 0.6 MG tablet Take 1 tablet (0.6 mg total) by mouth daily. 14 tablet 0  . CRESTOR 5 MG tablet take 1 tablet by mouth at bedtime 90 tablet 1  . glipiZIDE (GLUCOTROL XL) 10 MG 24 hr tablet TAKE ONE TABLET BY MOUTH EVERY DAY 90 tablet 0  . HYDROcodone-acetaminophen (NORCO/VICODIN) 5-325 MG per tablet Take 1 tablet by mouth every 6 (six) hours as needed for moderate pain. 20 tablet 0  . LUMIGAN 0.01 % SOLN Place 1 drop into both eyes every morning.     . metoprolol succinate (TOPROL-XL) 25 MG 24 hr tablet TAKE ONE-HALF TABLET BY MOUTH EVERY DAY 45 tablet 3  . senna-docusate (SENOKOT-S) 8.6-50 MG per tablet Take 1 tablet by mouth at bedtime as needed for mild constipation. 30 tablet 2  . simethicone (GAS-X) 80 MG chewable tablet Chew 1 tablet (80 mg total) by  mouth every 6 (six) hours as needed for flatulence. 30 tablet 0  . sitaGLIPtin (JANUVIA) 25 MG tablet Take 1 tablet (25 mg total) by mouth daily. 30 tablet 2  . warfarin (COUMADIN) 5 MG tablet Take 1 tablet by mouth daily, except 1.5 mg on Mondays, Wednesdays, and Fridays (Patient taking differently: Take 5-7.5 mg by mouth daily. Take 1 tablet by mouth daily, except 1 and 1/2 tablets on Mondays, Wednesdays, and Fridays) 36 tablet 3   No current facility-administered medications for this visit.   Review of Systems  General: Denies fever, diaphoresis, appetite change, and fatigue.  Respiratory: Denies SOB, cough, and wheezing.   Cardiovascular: Denies chest pain and palpitations.  Gastrointestinal: Denies nausea, vomiting, abdominal pain, and diarrhea Musculoskeletal: Denies myalgias, arthralgias, back pain, and gait problem.  Neurological: Denies dizziness, syncope, weakness, lightheadedness, and headaches.  Psychiatric/Behavioral: Denies mood changes, sleep disturbance, and agitation.    Objective:   Physical Exam: Filed Vitals:   01/14/15 1507  BP: 202/81; Recheck 160/80's  Pulse: 78  Temp: 97.6 F (36.4 C)  TempSrc: Oral  Height: 5\' 2"  (1.575 m)  Weight: 156 lb 6.4 oz (70.943 kg)  SpO2: 100%    General: Elderly AA female, alert, cooperative, NAD. HEENT: PERRL, EOMI. Moist mucus membranes Neck: Full range of motion without  pain, supple, no lymphadenopathy or carotid bruits Lungs: Clear to ascultation bilaterally, normal work of respiration, no wheezes, rales, rhonchi Heart: RRR, no murmurs, gallops, or rubs Abdomen: Soft, non-tender, non-distended, BS + Extremities: No cyanosis, clubbing, or edema Neurologic: Alert & oriented x3, cranial nerves II-XII intact, strength grossly intact, sensation intact to light touch   Assessment & Plan:   Please see problem based assessment and plan.

## 2015-01-14 NOTE — Patient Instructions (Signed)
1. Please schedule a follow up appointment w/ your PCP in 4 weeks.   2. Please take all medications as previously prescribed with the following changes:  Continue to take Norvasc (amlodipine) 10 mg daily + Toprol-XL 25 mg daily. MAKE SURE YOU TAKE THESE WHEN YOU GET HOME.  I will call you after your labs come back and tell you whether to restart your Lotensin (benazepril).   Do not take your Lasix any more.   3. If you have worsening of your symptoms or new symptoms arise, please call the clinic [phone]), or go to the ER immediately if symptoms are severe.

## 2015-01-15 DIAGNOSIS — M109 Gout, unspecified: Secondary | ICD-10-CM | POA: Diagnosis not present

## 2015-01-15 MED ORDER — BENAZEPRIL HCL 20 MG PO TABS: 20.0000 mg | ORAL_TABLET | Freq: Every day | ORAL | Status: DC

## 2015-01-15 NOTE — Assessment & Plan Note (Signed)
Cr significantly improved today, now 1.25. Suspect AKI was prerenal.  -Resume Benazepril at 20 mg daily for now -Discontinue Lasix indefinitely. No clear indication for this.  -Recheck BMP at next visit in 4 weeks, will likely need increased dose of ACEI at that time.

## 2015-01-15 NOTE — Assessment & Plan Note (Signed)
BP Readings from Last 3 Encounters:  01/14/15 164/86  01/08/15 146/61  01/01/15 127/73    Lab Results  Component Value Date   NA 135 01/14/2015   K 4.8 01/14/2015   CREATININE 1.25* 01/14/2015    Assessment: Blood pressure control:  Elevated Comments: SBP 200's initially, 160's on recheck. Patient did not take her Norvasc or Toprol-XL this AM.   Plan: Medications:  continue current medications; Advised patient to take Norvasc 10 mg daily + Toprol-XL 25 mg daily when she returned home. Based on improved kidney function, can resume benazepril. Will start at 20 mg daily for now.  Educational resources provided: brochure (denies) Other plans: Patient to return in 4 weeks.

## 2015-01-16 DIAGNOSIS — M109 Gout, unspecified: Secondary | ICD-10-CM | POA: Diagnosis not present

## 2015-01-16 NOTE — Addendum Note (Signed)
Addended by: Gilles Chiquito B on: 01/16/2015 02:47 PM   Modules accepted: Level of Service

## 2015-01-16 NOTE — Progress Notes (Signed)
Internal Medicine Clinic Attending  Case discussed with Dr. Jones soon after the resident saw the patient.  We reviewed the resident's history and exam and pertinent patient test results.  I agree with the assessment, diagnosis, and plan of care documented in the resident's note. 

## 2015-01-17 DIAGNOSIS — M109 Gout, unspecified: Secondary | ICD-10-CM | POA: Diagnosis not present

## 2015-01-18 DIAGNOSIS — M109 Gout, unspecified: Secondary | ICD-10-CM | POA: Diagnosis not present

## 2015-01-20 DIAGNOSIS — M109 Gout, unspecified: Secondary | ICD-10-CM | POA: Diagnosis not present

## 2015-01-21 DIAGNOSIS — M109 Gout, unspecified: Secondary | ICD-10-CM | POA: Diagnosis not present

## 2015-01-22 ENCOUNTER — Telehealth: Payer: Self-pay | Admitting: *Deleted

## 2015-01-22 DIAGNOSIS — M109 Gout, unspecified: Secondary | ICD-10-CM | POA: Diagnosis not present

## 2015-01-22 NOTE — Telephone Encounter (Signed)
Pt's CNA called before lunch about recent lab work. Pt has been off benazepril till lab results are back. Talked with Dr Ronnald Ramp - creatinine are better. Pt can resume benazepril at 20mg  daily till next appt in clinic 02/03/15. Pt aware and states she understands. Rx already sent to pharmacy 01/15/15. Hilda Blades Husain Costabile RN 01/22/15 2:35PM

## 2015-01-23 DIAGNOSIS — M109 Gout, unspecified: Secondary | ICD-10-CM | POA: Diagnosis not present

## 2015-01-24 DIAGNOSIS — K8061 Calculus of gallbladder and bile duct with cholecystitis, unspecified, with obstruction: Secondary | ICD-10-CM

## 2015-01-24 DIAGNOSIS — M109 Gout, unspecified: Secondary | ICD-10-CM | POA: Diagnosis not present

## 2015-01-24 HISTORY — DX: Calculus of gallbladder and bile duct with cholecystitis, unspecified, with obstruction: K80.61

## 2015-01-25 DIAGNOSIS — M109 Gout, unspecified: Secondary | ICD-10-CM | POA: Diagnosis not present

## 2015-01-27 DIAGNOSIS — M109 Gout, unspecified: Secondary | ICD-10-CM | POA: Diagnosis not present

## 2015-01-28 DIAGNOSIS — M109 Gout, unspecified: Secondary | ICD-10-CM | POA: Diagnosis not present

## 2015-01-29 DIAGNOSIS — M109 Gout, unspecified: Secondary | ICD-10-CM | POA: Diagnosis not present

## 2015-01-30 ENCOUNTER — Other Ambulatory Visit: Payer: Self-pay | Admitting: Internal Medicine

## 2015-01-30 DIAGNOSIS — M109 Gout, unspecified: Secondary | ICD-10-CM | POA: Diagnosis not present

## 2015-01-31 DIAGNOSIS — M109 Gout, unspecified: Secondary | ICD-10-CM | POA: Diagnosis not present

## 2015-02-01 DIAGNOSIS — M109 Gout, unspecified: Secondary | ICD-10-CM | POA: Diagnosis not present

## 2015-02-03 ENCOUNTER — Encounter: Payer: Self-pay | Admitting: Internal Medicine

## 2015-02-03 ENCOUNTER — Ambulatory Visit (INDEPENDENT_AMBULATORY_CARE_PROVIDER_SITE_OTHER): Payer: Medicare Other | Admitting: Pharmacist

## 2015-02-03 ENCOUNTER — Ambulatory Visit (INDEPENDENT_AMBULATORY_CARE_PROVIDER_SITE_OTHER): Payer: Medicare Other | Admitting: Internal Medicine

## 2015-02-03 VITALS — BP 134/52 | HR 57 | Temp 97.6°F | Ht 62.0 in | Wt 155.7 lb

## 2015-02-03 DIAGNOSIS — N183 Chronic kidney disease, stage 3 unspecified: Secondary | ICD-10-CM

## 2015-02-03 DIAGNOSIS — I10 Essential (primary) hypertension: Secondary | ICD-10-CM

## 2015-02-03 DIAGNOSIS — I129 Hypertensive chronic kidney disease with stage 1 through stage 4 chronic kidney disease, or unspecified chronic kidney disease: Secondary | ICD-10-CM

## 2015-02-03 DIAGNOSIS — M1A3410 Chronic gout due to renal impairment, right hand, without tophus (tophi): Secondary | ICD-10-CM

## 2015-02-03 DIAGNOSIS — K5900 Constipation, unspecified: Secondary | ICD-10-CM | POA: Diagnosis not present

## 2015-02-03 DIAGNOSIS — E118 Type 2 diabetes mellitus with unspecified complications: Secondary | ICD-10-CM

## 2015-02-03 DIAGNOSIS — M1A30X0 Chronic gout due to renal impairment, unspecified site, without tophus (tophi): Secondary | ICD-10-CM

## 2015-02-03 DIAGNOSIS — K5901 Slow transit constipation: Secondary | ICD-10-CM

## 2015-02-03 DIAGNOSIS — M109 Gout, unspecified: Secondary | ICD-10-CM | POA: Diagnosis not present

## 2015-02-03 DIAGNOSIS — Z7901 Long term (current) use of anticoagulants: Secondary | ICD-10-CM

## 2015-02-03 DIAGNOSIS — E1122 Type 2 diabetes mellitus with diabetic chronic kidney disease: Secondary | ICD-10-CM | POA: Diagnosis not present

## 2015-02-03 LAB — POCT INR: INR: 2.1

## 2015-02-03 NOTE — Assessment & Plan Note (Addendum)
No gout symptoms since she saw me last month.  No longer needing colchicine.  Will plan to resume colchicine (low dose) and start allopurinol at next visit for prophylaxis.

## 2015-02-03 NOTE — Progress Notes (Signed)
Anti-Coagulation Progress Note  Chelsea Mcdaniel is a 81 y.o. female who is currently on an anti-coagulation regimen.    RECENT RESULTS: Recent results are below, the most recent result is correlated with a dose of 40 mg. per week: Lab Results  Component Value Date   INR 2.10 02/03/2015   INR 3.20 01/06/2015   INR 2.9 12/02/2014    ANTI-COAG DOSE: Anticoagulation Dose Instructions as of 02/03/2015      [name] Tue Wed Thu Fri Sat   New Dose 5 mg 7.5 mg 5 mg 7.5 mg 5 mg 7.5 mg 5 mg       ANTICOAG SUMMARY: Anticoagulation Episode Summary    Current INR goal    Next INR check 02/24/2015   INR from last check 2.10 (02/03/2015)   Weekly max dose    Target end date    INR check location    Preferred lab    Send INR reminders to       Comments         ANTICOAG TODAY: Anticoagulation Summary as of 02/03/2015    INR goal    Selected INR 2.10 (02/03/2015)   Next INR check 02/24/2015   Target end date     Anticoagulation Episode Summary    INR check location    Preferred lab    Send INR reminders to    Comments       PATIENT INSTRUCTIONS: Patient Instructions  Patient instructed to take medications as defined in the Anti-coagulation Track section of this encounter.  Patient instructed to take today's dose.  Patient verbalized understanding of these instructions.       FOLLOW-UP Return in 3 weeks (on 02/24/2015) for Follow up INR at 2:45PM.  [name], III Pharm.D., CACP

## 2015-02-03 NOTE — Progress Notes (Signed)
Subjective:    Patient ID: Chelsea Mcdaniel, female    DOB: November 03, 1927, 81 y.o.   MRN: [id number]  HPI Comments: [name] is an 81 year old woman with PMH as below here for follow-up chronic conditions.  Please see problem based charting for assessment and plan.     Past Medical History  Diagnosis Date  . Diabetes mellitus   . Hyperlipidemia   . Hypertension   . CVA (cerebrovascular accident)  October 2007     CT of the head atrophy with multiple remote insults noted but no definite acute findings  . Blindness of left eye      likely related to stroke, left cataract removed from that eye with complications  . Anemia      normocytic anemia with baseline hemoglobin 10-11  . Chronic kidney disease 2006     left renal artery stenosis with probable hemodynamic significance, kidneys are normal in morphology without focal lesions or hydronephrosis this is based but cannot A. of the abdomen with and without contrast done on the 31st 2006  . Glaucoma   . Personal history of DVT (deep vein thrombosis) 08/11/2010   Current Outpatient Prescriptions on File Prior to Visit  Medication Sig Dispense Refill  . ACCU-CHEK AVIVA PLUS test strip Use 1 time daily to check blood sugar. 100 each 11  . ACCU-CHEK SOFTCLIX LANCETS lancets Use 1 time daily to check blood sugar. 100 each 1  . acetaminophen (TYLENOL) 325 MG tablet Take 2 tablets (650 mg total) by mouth every 6 (six) hours as needed.    Marland Kitchen amLODipine (NORVASC) 10 MG tablet Take 1 tablet (10 mg total) by mouth daily. For blood pressure 90 tablet 3  . benazepril (LOTENSIN) 20 MG tablet Take 1 tablet (20 mg total) by mouth daily. 30 tablet 2  . Blood Glucose Monitoring Suppl (ACCU-CHEK AVIVA) device Use as instructed 1 each 0  . Calcium Carbonate-Vitamin D 600-400 MG-UNIT per tablet Take 1 tablet by mouth 2 (two) times daily.    . colchicine 0.6 MG tablet Take 1 tablet (0.6 mg total) by mouth daily. 14 tablet 0  . CRESTOR 5 MG tablet take 1 tablet  by mouth at bedtime 90 tablet 1  . glipiZIDE (GLUCOTROL XL) 10 MG 24 hr tablet TAKE ONE TABLET BY MOUTH EVERY DAY 90 tablet 0  . HYDROcodone-acetaminophen (NORCO/VICODIN) 5-325 MG per tablet Take 1 tablet by mouth every 6 (six) hours as needed for moderate pain. 20 tablet 0  . LUMIGAN 0.01 % SOLN Place 1 drop into both eyes every morning.     . metoprolol succinate (TOPROL-XL) 25 MG 24 hr tablet TAKE ONE-HALF TABLET BY MOUTH EVERY DAY 45 tablet 3  . senna-docusate (SENOKOT-S) 8.6-50 MG per tablet Take 1 tablet by mouth at bedtime as needed for mild constipation. 30 tablet 2  . simethicone (GAS-X) 80 MG chewable tablet Chew 1 tablet (80 mg total) by mouth every 6 (six) hours as needed for flatulence. 30 tablet 0  . sitaGLIPtin (JANUVIA) 25 MG tablet Take 1 tablet (25 mg total) by mouth daily. 30 tablet 2  . warfarin (COUMADIN) 5 MG tablet Take 1 tablet by mouth daily, except 1.5 mg on Mondays, Wednesdays, and Fridays (Patient taking differently: Take 5-7.5 mg by mouth daily. Take 1 tablet by mouth daily, except 1 and 1/2 tablets on Mondays, Wednesdays, and Fridays) 36 tablet 3   No current facility-administered medications on file prior to visit.    Review of Systems  Constitutional: Negative for fever, chills and appetite change.  Respiratory: Negative for cough and shortness of breath.   Cardiovascular: Negative for chest pain, palpitations and leg swelling.  Gastrointestinal: Negative for nausea, vomiting, abdominal pain, diarrhea, constipation and blood in stool.  Genitourinary: Negative for dysuria, hematuria and difficulty urinating.  Neurological: Positive for light-headedness. Negative for syncope and headaches.       Occasional lightheaded when "gets up too fast."  Psychiatric/Behavioral: Negative for dysphoric mood.    Filed Vitals:   02/03/15 1557  BP: 134/52  Pulse: 57  Temp: 97.6 F (36.4 C)  TempSrc: Oral  Height: 5\' 2"  (1.575 m)  Weight: 155 lb 11.2 oz (70.625 kg)    SpO2: 100%     Objective:   Physical Exam  Constitutional: She is oriented to person, place, and time. She appears well-developed. No distress.  HENT:  Head: Normocephalic and atraumatic.  Mouth/Throat: Oropharynx is clear and moist. No oropharyngeal exudate.  Eyes: EOM are normal. Pupils are equal, round, and reactive to light.  Neck: Neck supple.  Cardiovascular: Normal rate, regular rhythm and normal heart sounds.  Exam reveals no gallop and no friction rub.   No murmur heard. Pulmonary/Chest: Effort normal and breath sounds normal. No respiratory distress. She has no wheezes. She has no rales.  Abdominal: Soft. Bowel sounds are normal. She exhibits no distension. There is no tenderness. There is no rebound.  Musculoskeletal: Normal range of motion. She exhibits no edema or tenderness.  Neurological: She is alert and oriented to person, place, and time. No cranial nerve deficit.  Skin: Skin is warm. She is not diaphoretic.  Psychiatric: She has a normal mood and affect. Her behavior is normal. Judgment and thought content normal.          Assessment & Plan:  Please see problem based charting for assessment and plan.

## 2015-02-03 NOTE — Patient Instructions (Signed)
1. Please come back to see me on 04/02/15 for follow-up.  Come back sooner if you have problem.   2. Please take all medications as prescribed.    3. If you have worsening of your symptoms or new symptoms arise, please call the clinic [phone] or go to the ER immediately if symptoms are severe.

## 2015-02-03 NOTE — Assessment & Plan Note (Addendum)
CR improved last visit.  BMP today - Cr back up to 1.9 and K 5.4.  I called Chelsea Mcdaniel and asked that she stop benazepril and asked about symptoms.  She is urinating normally, eating/drinkg, denies vomiting or diarrhea.  She asked that I inform her daughter in law (who fills the pill box) about med changes.  I spoke with her daught-in-law Chelsea Mcdaniel and asked that she remove benazepril from the pill box.  She said she will remove.  She confirms that Chelsea Mcdaniel is no longer taking lasix and has not had vomiting or diarrhea.  She says she may not be drinking enough.  I told her I would have the front desk call her to arrange follow-up lab visit.

## 2015-02-03 NOTE — Assessment & Plan Note (Addendum)
BP Readings from Last 3 Encounters:  02/03/15 134/52  01/14/15 163/72  01/08/15 146/61    Lab Results  Component Value Date   NA 135 01/14/2015   K 4.8 01/14/2015   CREATININE 1.25* 01/14/2015    Assessment: Blood pressure control:  well controlled Progress toward BP goal:   at goal Comments: Compliant with medications.  Daughter-in-law fills box every month.  Plan: Medications:  continue current medications:  Amlodipine 10mg  daily, benazepril 20mg  daily, Toprol XL 12.5mg   Educational resources provided: brochure, handout, video Self management tools provided:   Other plans: BMP this visit.  RTC in 3 months.   ADDENDUM:  Will d/c benazepril due to AKI.  She has an aide that checks BP.  Will bring back for repeat labs but may need to keep off of ACEI.

## 2015-02-03 NOTE — Assessment & Plan Note (Signed)
Lab Results  Component Value Date   HGBA1C 6.9 01/01/2015   HGBA1C 6.9 09/25/2014   HGBA1C 6.6 06/12/2014     Assessment: Diabetes control:  well controlled Progress toward A1C goal:   at goal  Comments: She does not think she is still on Januvia.  Will check with her daughter-in-law.  Plan: Medications:  continue current medications:  Continue Glipizide 10mg  daily Home glucose monitoring:  daily Frequency:   Timing:   Instruction/counseling given: reminded to bring blood glucose meter & log to each visit Educational resources provided: brochure, handout Self management tools provided: copy of home glucose meter download Other plans: RTC in 2 months for follow-up.

## 2015-02-03 NOTE — Assessment & Plan Note (Addendum)
Improving with Senokot-S.  Advised increasing water, fruits, veg.

## 2015-02-03 NOTE — Patient Instructions (Signed)
Patient instructed to take medications as defined in the Anti-coagulation Track section of this encounter.  Patient instructed to take today's dose.  Patient verbalized understanding of these instructions.    

## 2015-02-04 ENCOUNTER — Other Ambulatory Visit: Payer: Self-pay | Admitting: Internal Medicine

## 2015-02-04 DIAGNOSIS — H2511 Age-related nuclear cataract, right eye: Secondary | ICD-10-CM | POA: Diagnosis not present

## 2015-02-04 DIAGNOSIS — H2702 Aphakia, left eye: Secondary | ICD-10-CM | POA: Diagnosis not present

## 2015-02-04 DIAGNOSIS — H338 Other retinal detachments: Secondary | ICD-10-CM | POA: Diagnosis not present

## 2015-02-04 DIAGNOSIS — H4011X3 Primary open-angle glaucoma, severe stage: Secondary | ICD-10-CM | POA: Diagnosis not present

## 2015-02-04 DIAGNOSIS — M109 Gout, unspecified: Secondary | ICD-10-CM | POA: Diagnosis not present

## 2015-02-04 LAB — BASIC METABOLIC PANEL WITH GFR
BUN: 25 mg/dL — ABNORMAL HIGH (ref 6–23)
CALCIUM: 9.1 mg/dL (ref 8.4–10.5)
CHLORIDE: 102 meq/L (ref 96–112)
CO2: 26 mEq/L (ref 19–32)
Creat: 1.91 mg/dL — ABNORMAL HIGH (ref 0.50–1.10)
GFR, Est African American: 27 mL/min — ABNORMAL LOW
GFR, Est Non African American: 24 mL/min — ABNORMAL LOW
Glucose, Bld: 153 mg/dL — ABNORMAL HIGH (ref 70–99)
Potassium: 5.4 mEq/L — ABNORMAL HIGH (ref 3.5–5.3)
SODIUM: 139 meq/L (ref 135–145)

## 2015-02-05 DIAGNOSIS — M109 Gout, unspecified: Secondary | ICD-10-CM | POA: Diagnosis not present

## 2015-02-05 NOTE — Progress Notes (Signed)
Internal Medicine Clinic Attending  Case discussed with Dr. Wilson soon after the resident saw the patient.  We reviewed the resident's history and exam and pertinent patient test results.  I agree with the assessment, diagnosis, and plan of care documented in the resident's note.  

## 2015-02-06 DIAGNOSIS — M109 Gout, unspecified: Secondary | ICD-10-CM | POA: Diagnosis not present

## 2015-02-07 ENCOUNTER — Other Ambulatory Visit: Payer: Self-pay | Admitting: Internal Medicine

## 2015-02-07 DIAGNOSIS — M109 Gout, unspecified: Secondary | ICD-10-CM | POA: Diagnosis not present

## 2015-02-08 DIAGNOSIS — M109 Gout, unspecified: Secondary | ICD-10-CM | POA: Diagnosis not present

## 2015-02-10 DIAGNOSIS — M109 Gout, unspecified: Secondary | ICD-10-CM | POA: Diagnosis not present

## 2015-02-11 ENCOUNTER — Emergency Department (HOSPITAL_COMMUNITY): Payer: Medicare Other

## 2015-02-11 ENCOUNTER — Inpatient Hospital Stay (HOSPITAL_COMMUNITY): Payer: Medicare Other

## 2015-02-11 ENCOUNTER — Encounter (HOSPITAL_COMMUNITY): Payer: Self-pay | Admitting: *Deleted

## 2015-02-11 ENCOUNTER — Inpatient Hospital Stay (HOSPITAL_COMMUNITY)
Admission: EM | Admit: 2015-02-11 | Discharge: 2015-02-12 | DRG: 444 | Disposition: A | Discharge status: Home / Self Care | Payer: Medicare Other | Attending: Internal Medicine | Admitting: Internal Medicine

## 2015-02-11 DIAGNOSIS — N184 Chronic kidney disease, stage 4 (severe): Secondary | ICD-10-CM | POA: Diagnosis not present

## 2015-02-11 DIAGNOSIS — Z86718 Personal history of other venous thrombosis and embolism: Secondary | ICD-10-CM | POA: Diagnosis not present

## 2015-02-11 DIAGNOSIS — R933 Abnormal findings on diagnostic imaging of other parts of digestive tract: Secondary | ICD-10-CM | POA: Diagnosis not present

## 2015-02-11 DIAGNOSIS — H5442 Blindness, left eye, normal vision right eye: Secondary | ICD-10-CM | POA: Diagnosis not present

## 2015-02-11 DIAGNOSIS — Z7901 Long term (current) use of anticoagulants: Secondary | ICD-10-CM

## 2015-02-11 DIAGNOSIS — E119 Type 2 diabetes mellitus without complications: Secondary | ICD-10-CM | POA: Diagnosis not present

## 2015-02-11 DIAGNOSIS — I129 Hypertensive chronic kidney disease with stage 1 through stage 4 chronic kidney disease, or unspecified chronic kidney disease: Secondary | ICD-10-CM | POA: Diagnosis present

## 2015-02-11 DIAGNOSIS — Z8673 Personal history of transient ischemic attack (TIA), and cerebral infarction without residual deficits: Secondary | ICD-10-CM

## 2015-02-11 DIAGNOSIS — D631 Anemia in chronic kidney disease: Secondary | ICD-10-CM | POA: Diagnosis present

## 2015-02-11 DIAGNOSIS — M810 Age-related osteoporosis without current pathological fracture: Secondary | ICD-10-CM | POA: Diagnosis not present

## 2015-02-11 DIAGNOSIS — M1A9XX0 Chronic gout, unspecified, without tophus (tophi): Secondary | ICD-10-CM | POA: Diagnosis not present

## 2015-02-11 DIAGNOSIS — Z79899 Other long term (current) drug therapy: Secondary | ICD-10-CM | POA: Diagnosis not present

## 2015-02-11 DIAGNOSIS — K8050 Calculus of bile duct without cholangitis or cholecystitis without obstruction: Secondary | ICD-10-CM | POA: Diagnosis not present

## 2015-02-11 DIAGNOSIS — I6930 Unspecified sequelae of cerebral infarction: Secondary | ICD-10-CM

## 2015-02-11 DIAGNOSIS — D649 Anemia, unspecified: Secondary | ICD-10-CM

## 2015-02-11 DIAGNOSIS — K2970 Gastritis, unspecified, without bleeding: Secondary | ICD-10-CM | POA: Diagnosis not present

## 2015-02-11 DIAGNOSIS — E1122 Type 2 diabetes mellitus with diabetic chronic kidney disease: Secondary | ICD-10-CM | POA: Diagnosis not present

## 2015-02-11 DIAGNOSIS — K851 Biliary acute pancreatitis: Secondary | ICD-10-CM | POA: Diagnosis not present

## 2015-02-11 DIAGNOSIS — E785 Hyperlipidemia, unspecified: Secondary | ICD-10-CM | POA: Diagnosis not present

## 2015-02-11 DIAGNOSIS — M1A30X0 Chronic gout due to renal impairment, unspecified site, without tophus (tophi): Secondary | ICD-10-CM | POA: Diagnosis present

## 2015-02-11 DIAGNOSIS — E1159 Type 2 diabetes mellitus with other circulatory complications: Secondary | ICD-10-CM | POA: Diagnosis present

## 2015-02-11 DIAGNOSIS — K8021 Calculus of gallbladder without cholecystitis with obstruction: Secondary | ICD-10-CM

## 2015-02-11 DIAGNOSIS — R1011 Right upper quadrant pain: Secondary | ICD-10-CM | POA: Diagnosis not present

## 2015-02-11 DIAGNOSIS — K8071 Calculus of gallbladder and bile duct without cholecystitis with obstruction: Secondary | ICD-10-CM | POA: Diagnosis not present

## 2015-02-11 DIAGNOSIS — R10813 Right lower quadrant abdominal tenderness: Secondary | ICD-10-CM | POA: Diagnosis not present

## 2015-02-11 DIAGNOSIS — D638 Anemia in other chronic diseases classified elsewhere: Secondary | ICD-10-CM | POA: Diagnosis present

## 2015-02-11 DIAGNOSIS — N289 Disorder of kidney and ureter, unspecified: Secondary | ICD-10-CM

## 2015-02-11 DIAGNOSIS — K8020 Calculus of gallbladder without cholecystitis without obstruction: Secondary | ICD-10-CM | POA: Diagnosis not present

## 2015-02-11 DIAGNOSIS — R934 Abnormal findings on diagnostic imaging of urinary organs: Secondary | ICD-10-CM | POA: Diagnosis not present

## 2015-02-11 DIAGNOSIS — H409 Unspecified glaucoma: Secondary | ICD-10-CM | POA: Diagnosis present

## 2015-02-11 DIAGNOSIS — I10 Essential (primary) hypertension: Secondary | ICD-10-CM

## 2015-02-11 DIAGNOSIS — K831 Obstruction of bile duct: Secondary | ICD-10-CM

## 2015-02-11 DIAGNOSIS — R109 Unspecified abdominal pain: Secondary | ICD-10-CM

## 2015-02-11 HISTORY — DX: Calculus of gallbladder and bile duct with cholecystitis, unspecified, with obstruction: K80.61

## 2015-02-11 LAB — COMPREHENSIVE METABOLIC PANEL
ALBUMIN: 3.6 g/dL (ref 3.5–5.0)
ALT: 143 U/L — ABNORMAL HIGH (ref 14–54)
ANION GAP: 10 (ref 5–15)
AST: 154 U/L — ABNORMAL HIGH (ref 15–41)
Alkaline Phosphatase: 329 U/L — ABNORMAL HIGH (ref 38–126)
BUN: 20 mg/dL (ref 6–20)
CALCIUM: 9.2 mg/dL (ref 8.9–10.3)
CHLORIDE: 104 mmol/L (ref 101–111)
CO2: 22 mmol/L (ref 22–32)
CREATININE: 1.46 mg/dL — AB (ref 0.44–1.00)
GFR, EST AFRICAN AMERICAN: 37 mL/min — AB (ref >60)
GFR, EST NON AFRICAN AMERICAN: 32 mL/min — AB (ref >60)
Glucose, Bld: 254 mg/dL — ABNORMAL HIGH (ref 65–99)
POTASSIUM: 4.1 mmol/L (ref 3.5–5.1)
Sodium: 136 mmol/L (ref 135–145)
Total Bilirubin: 5.1 mg/dL — ABNORMAL HIGH (ref 0.3–1.2)
Total Protein: 7.3 g/dL (ref 6.5–8.1)

## 2015-02-11 LAB — LIPASE, BLOOD: LIPASE: 97 U/L — AB (ref 22–51)

## 2015-02-11 LAB — URINALYSIS, ROUTINE W REFLEX MICROSCOPIC
GLUCOSE, UA: 500 mg/dL — AB
Ketones, ur: NEGATIVE mg/dL
Nitrite: NEGATIVE
SPECIFIC GRAVITY, URINE: 1.024 (ref 1.005–1.030)
Urobilinogen, UA: 1 mg/dL (ref 0.0–1.0)
pH: 5.5 (ref 5.0–8.0)

## 2015-02-11 LAB — DIFFERENTIAL
BASOS PCT: 0 % (ref 0–1)
Basophils Absolute: 0 10*3/uL (ref 0.0–0.1)
Eosinophils Absolute: 0 10*3/uL (ref 0.0–0.7)
Eosinophils Relative: 1 % (ref 0–5)
LYMPHS PCT: 9 % — AB (ref 12–46)
Lymphs Abs: 0.6 10*3/uL — ABNORMAL LOW (ref 0.7–4.0)
MONOS PCT: 8 % (ref 3–12)
Monocytes Absolute: 0.6 10*3/uL (ref 0.1–1.0)
NEUTROS ABS: 5.6 10*3/uL (ref 1.7–7.7)
NEUTROS PCT: 82 % — AB (ref 43–77)

## 2015-02-11 LAB — CBC
HEMATOCRIT: 31 % — AB (ref 36.0–46.0)
HEMOGLOBIN: 10.4 g/dL — AB (ref 12.0–15.0)
MCH: 29.9 pg (ref 26.0–34.0)
MCHC: 33.5 g/dL (ref 30.0–36.0)
MCV: 89.1 fL (ref 78.0–100.0)
Platelets: 223 10*3/uL (ref 150–400)
RBC: 3.48 MIL/uL — AB (ref 3.87–5.11)
RDW: 13 % (ref 11.5–15.5)
WBC: 6.1 10*3/uL (ref 4.0–10.5)

## 2015-02-11 LAB — URINE MICROSCOPIC-ADD ON

## 2015-02-11 LAB — PROTIME-INR
INR: 3.29 — AB (ref 0.00–1.49)
PROTHROMBIN TIME: 32.8 s — AB (ref 11.6–15.2)

## 2015-02-11 MED ORDER — SODIUM CHLORIDE 0.9 % IV SOLN
INTRAVENOUS | Status: AC
  Administered 2015-02-11: 12:00:00 via INTRAVENOUS

## 2015-02-11 MED ORDER — COLCHICINE 0.6 MG PO TABS
0.6000 mg | ORAL_TABLET | Freq: Every day | ORAL | Status: DC
  Administered 2015-02-11: 0.6 mg via ORAL
  Filled 2015-02-11 (×2): qty 1

## 2015-02-11 MED ORDER — HYDROMORPHONE HCL 1 MG/ML IJ SOLN: 0.5000 mg | INTRAMUSCULAR | Status: DC | PRN

## 2015-02-11 MED ORDER — HYDROMORPHONE HCL 1 MG/ML IJ SOLN
1.0000 mg | Freq: Once | INTRAMUSCULAR | Status: AC
  Administered 2015-02-11: 1 mg via INTRAVENOUS
  Filled 2015-02-11: qty 1

## 2015-02-11 MED ORDER — METOPROLOL SUCCINATE ER 25 MG PO TB24
12.5000 mg | ORAL_TABLET | Freq: Every day | ORAL | Status: DC
  Administered 2015-02-11 – 2015-02-12 (×2): 12.5 mg via ORAL
  Filled 2015-02-11 (×2): qty 1

## 2015-02-11 MED ORDER — ROSUVASTATIN CALCIUM 5 MG PO TABS
5.0000 mg | ORAL_TABLET | Freq: Every day | ORAL | Status: DC
  Administered 2015-02-11: 5 mg via ORAL
  Filled 2015-02-11 (×2): qty 1

## 2015-02-11 MED ORDER — ONDANSETRON HCL 4 MG/2ML IJ SOLN
4.0000 mg | Freq: Once | INTRAMUSCULAR | Status: AC
  Administered 2015-02-11: 4 mg via INTRAVENOUS
  Filled 2015-02-11: qty 2

## 2015-02-11 MED ORDER — LATANOPROST 0.005 % OP SOLN
1.0000 [drp] | Freq: Every day | OPHTHALMIC | Status: DC
  Administered 2015-02-11 – 2015-02-12 (×2): 1 [drp] via OPHTHALMIC
  Filled 2015-02-11: qty 2.5

## 2015-02-11 MED ORDER — AMLODIPINE BESYLATE 10 MG PO TABS
10.0000 mg | ORAL_TABLET | Freq: Every day | ORAL | Status: DC
  Administered 2015-02-11 – 2015-02-12 (×2): 10 mg via ORAL
  Filled 2015-02-11: qty 2
  Filled 2015-02-11: qty 1

## 2015-02-11 MED ORDER — CEFTRIAXONE SODIUM IN DEXTROSE 40 MG/ML IV SOLN
2.0000 g | INTRAVENOUS | Status: DC
Start: 2015-02-11 — End: 2015-02-12
  Administered 2015-02-11: 2 g via INTRAVENOUS
  Filled 2015-02-11 (×2): qty 50

## 2015-02-11 NOTE — Care Management Note (Signed)
Case Management Note  Patient Details  Name: Chelsea Mcdaniel MRN: [id number] Date of Birth: 24-Nov-1927  Subjective/Objective:                    Action/Plan: UR completed   Expected Discharge Date:                  Expected Discharge Plan:  Home/Self Care  In-House Referral:     Discharge planning Services     Post Acute Care Choice:    Choice offered to:     DME Arranged:    DME Agency:     HH Arranged:    [place] Agency:     Status of Service:  In process, will continue to follow  Medicare Important Message Given:    Date Medicare IM Given:    Medicare IM give by:    Date Additional Medicare IM Given:    Additional Medicare Important Message give by:     If discussed at [place] of Stay Meetings, dates discussed:    Additional Comments:  Marilu Favre, RN 02/11/2015, 2:37 PM

## 2015-02-11 NOTE — ED Notes (Signed)
Patient given apple sauce to get her oral medications down.  Patient states "I normally take my pills with a banana".  Family with patient at bedside.

## 2015-02-11 NOTE — ED Notes (Signed)
Patient arrived via EMS with c/o right upper and lower quad pain that radiates to her back.  Was seen here for the same and dx with gallstones in March.  Patient diaphoretic

## 2015-02-11 NOTE — ED Provider Notes (Signed)
CSN: [id number]     Arrival date & time 02/11/15  0351 History   First MD Initiated Contact with Patient 02/11/15 [phone]     Chief Complaint  Patient presents with  . Abdominal Pain     (Consider location/radiation/quality/duration/timing/severity/associated sxs/prior Treatment) Patient is a 81 y.o. female presenting with abdominal pain. The history is provided by the patient.  Abdominal Pain She woke up this evening with severe right upper quadrant pain with radiation to the back. There is associated nausea but no vomiting. She denies fever, chills, sweats. Nothing made the pain better nothing made it worse. She rated pain at 10/10. A similar episode occurred the previous night but resolved. During the day yesterday, there was no pain or nausea and her appetite had been good. Of note, she had been in the emergency department several months ago and has been diagnosed with gallstones but she had not seen a Psychologist, sport and exercise.  Past Medical History  Diagnosis Date  . Diabetes mellitus   . Hyperlipidemia   . Hypertension   . CVA (cerebrovascular accident)  October 2007     CT of the head atrophy with multiple remote insults noted but no definite acute findings  . Blindness of left eye      likely related to stroke, left cataract removed from that eye with complications  . Anemia      normocytic anemia with baseline hemoglobin 10-11  . Chronic kidney disease 2006     left renal artery stenosis with probable hemodynamic significance, kidneys are normal in morphology without focal lesions or hydronephrosis this is based but cannot A. of the abdomen with and without contrast done on the 31st 2006  . Glaucoma   . Personal history of DVT (deep vein thrombosis) 08/11/2010   Past Surgical History  Procedure Laterality Date  . Transthoracic echocardiogram   May 2008     left ventricular systolic function normal EF estimated range of 55-60%, no definite diagnostic evidence of left ventricular regional wall  motion abnormalities, Doppler parameters consistent with abnormal left ventricular relaxation, findings suggestive of possible bicuspid aortic valve and aortic valve thickness mildly increase   Family History  Problem Relation Age of Onset  . Heart disease Mother   . Diabetes Mother   . Hyperlipidemia Mother   . Hypertension Mother   . Heart disease Father   . Diabetes Father   . Hyperlipidemia Father   . Hypertension Father    History  Substance Use Topics  . Smoking status: Never Smoker   . Smokeless tobacco: Never Used  . Alcohol Use: No   OB History    No data available     Review of Systems  Gastrointestinal: Positive for abdominal pain.  All other systems reviewed and are negative.     Allergies  Review of patient's allergies indicates no known allergies.  Home Medications   Prior to Admission medications   Medication Sig Start Date End Date Taking? Authorizing Provider  acetaminophen (TYLENOL) 325 MG tablet Take 2 tablets (650 mg total) by mouth every 6 (six) hours as needed. 08/22/14  Yes Marjan Rabbani, MD  amLODipine (NORVASC) 10 MG tablet Take 1 tablet (10 mg total) by mouth daily. For blood pressure 09/25/14 09/24/15 Yes Alex Ronnie Derby, DO  Calcium Carbonate-Vitamin D 600-400 MG-UNIT per tablet Take 1 tablet by mouth 2 (two) times daily.   Yes Historical Provider, MD  colchicine 0.6 MG tablet Take 1 tablet (0.6 mg total) by mouth daily. 11/27/14  Yes Alex  Ronnie Derby, DO  CRESTOR 5 MG tablet take 1 tablet by mouth at bedtime 12/19/14  Yes Francesca Oman, DO  GLIPIZIDE XL 10 MG 24 hr tablet TAKE ONE TABLET BY MOUTH EVERY DAY 02/07/15  Yes Francesca Oman, DO  LUMIGAN 0.01 % SOLN Place 1 drop into both eyes every morning.  12/05/13  Yes Historical Provider, MD  metoprolol succinate (TOPROL-XL) 25 MG 24 hr tablet TAKE ONE-HALF TABLET BY MOUTH EVERY DAY 09/13/14  Yes Nischal Narendra, MD  sitaGLIPtin (JANUVIA) 25 MG tablet Take 1 tablet (25 mg total) by mouth daily. 01/08/15  Yes  Corky Sox, MD  warfarin (COUMADIN) 5 MG tablet Take 1 tablet by mouth daily, except 1.5 mg on Mondays, Wednesdays, and Fridays Patient taking differently: Take 5 mg by mouth daily.  10/14/14  Yes Oval Linsey, MD  ACCU-CHEK AVIVA PLUS test strip Use 1 time daily to check blood sugar. 11/06/14   Francesca Oman, DO  ACCU-CHEK SOFTCLIX LANCETS lancets Use 1 time daily to check blood sugar. 08/13/14   Francesca Oman, DO  benazepril (LOTENSIN) 20 MG tablet Take 1 tablet (20 mg total) by mouth daily. Patient not taking: Reported on 02/11/2015 01/15/15   Corky Sox, MD  Blood Glucose Monitoring Suppl (ACCU-CHEK AVIVA) device Use as instructed 02/15/14 02/15/15  Francesca Oman, DO  HYDROcodone-acetaminophen (NORCO/VICODIN) 5-325 MG per tablet Take 1 tablet by mouth every 6 (six) hours as needed for moderate pain. Patient not taking: Reported on 02/11/2015 10/24/14   Evelina Bucy, MD  senna-docusate (SENOKOT-S) 8.6-50 MG per tablet Take 1 tablet by mouth at bedtime as needed for mild constipation. Patient not taking: Reported on 02/11/2015 01/08/15   Corky Sox, MD  simethicone (GAS-X) 80 MG chewable tablet Chew 1 tablet (80 mg total) by mouth every 6 (six) hours as needed for flatulence. Patient not taking: Reported on 02/11/2015 01/08/15   Corky Sox, MD   BP 162/52 mmHg  Pulse 76  Resp 13  SpO2 93% Physical Exam  Nursing note and vitals reviewed.  81 year old female, resting comfortably and in no acute distress. Vital signs are significant for hypertension. Oxygen saturation is 93%, which is normal. Head is normocephalic and atraumatic. PERRLA, EOMI. Oropharynx is clear.mild scleral icterus is present. Neck is nontender and supple without adenopathy or JVD. Back is nontender and there is no CVA tenderness. Lungs are clear without rales, wheezes, or rhonchi. Chest is nontender. Heart has regular rate and rhythm without murmur. Abdomen is soft, flat, with moderate tenderness in the right upper  quadrant. There is no rebound or guarding. There are no masses or hepatosplenomegaly and peristalsis is hypoactive. Extremities have no cyanosis or edema, full range of motion is present. Skin is warm and dry without rash. Neurologic: Mental status is normal, cranial nerves are intact, there are no motor or sensory deficits.  ED Course  Procedures (including critical care time) Labs Review Results for orders placed or performed during the hospital encounter of 02/11/15  Lipase, blood  Result Value Ref Range   Lipase 97 (H) 22 - 51 U/L  Comprehensive metabolic panel  Result Value Ref Range   Sodium 136 135 - 145 mmol/L   Potassium 4.1 3.5 - 5.1 mmol/L   Chloride 104 101 - 111 mmol/L   CO2 22 22 - 32 mmol/L   Glucose, Bld 254 (H) 65 - 99 mg/dL   BUN 20 6 - 20 mg/dL   Creatinine, Ser 1.46 (H)  0.44 - 1.00 mg/dL   Calcium 9.2 8.9 - 10.3 mg/dL   Total Protein 7.3 6.5 - 8.1 g/dL   Albumin 3.6 3.5 - 5.0 g/dL   AST 154 (H) 15 - 41 U/L   ALT 143 (H) 14 - 54 U/L   Alkaline Phosphatase 329 (H) 38 - 126 U/L   Total Bilirubin 5.1 (H) 0.3 - 1.2 mg/dL   GFR calc non Af Amer 32 (L) >60 mL/min   GFR calc Af Amer 37 (L) >60 mL/min   Anion gap 10 5 - 15  CBC  Result Value Ref Range   WBC 6.1 4.0 - 10.5 K/uL   RBC 3.48 (L) 3.87 - 5.11 MIL/uL   Hemoglobin 10.4 (L) 12.0 - 15.0 g/dL   HCT 31.0 (L) 36.0 - 46.0 %   MCV 89.1 78.0 - 100.0 fL   MCH 29.9 26.0 - 34.0 pg   MCHC 33.5 30.0 - 36.0 g/dL   RDW 13.0 11.5 - 15.5 %   Platelets 223 150 - 400 K/uL  Urinalysis, Routine w reflex microscopic (not at [facility])  Result Value Ref Range   Color, Urine ORANGE (A) YELLOW   APPearance CLEAR CLEAR   Specific Gravity, Urine 1.024 1.005 - 1.030   pH 5.5 5.0 - 8.0   Glucose, UA 500 (A) NEGATIVE mg/dL   Hgb urine dipstick TRACE (A) NEGATIVE   Bilirubin Urine LARGE (A) NEGATIVE   Ketones, ur NEGATIVE NEGATIVE mg/dL   Protein, ur >300 (A) NEGATIVE mg/dL   Urobilinogen, UA 1.0 0.0 - 1.0 mg/dL   Nitrite  NEGATIVE NEGATIVE   Leukocytes, UA SMALL (A) NEGATIVE  Protime-INR  Result Value Ref Range   Prothrombin Time 32.8 (H) 11.6 - 15.2 seconds   INR 3.29 (H) 0.00 - 1.49  Differential  Result Value Ref Range   Neutrophils Relative % 82 (H) 43 - 77 %   Neutro Abs 5.6 1.7 - 7.7 K/uL   Lymphocytes Relative 9 (L) 12 - 46 %   Lymphs Abs 0.6 (L) 0.7 - 4.0 K/uL   Monocytes Relative 8 3 - 12 %   Monocytes Absolute 0.6 0.1 - 1.0 K/uL   Eosinophils Relative 1 0 - 5 %   Eosinophils Absolute 0.0 0.0 - 0.7 K/uL   Basophils Relative 0 0 - 1 %   Basophils Absolute 0.0 0.0 - 0.1 K/uL  Urine microscopic-add on  Result Value Ref Range   Squamous Epithelial / LPF FEW (A) RARE   WBC, UA 7-10 <3 WBC/hpf   RBC / HPF 3-6 <3 RBC/hpf   Bacteria, UA RARE RARE   Casts HYALINE CASTS (A) NEGATIVE   Imaging Review US Abdomen Complete  02/11/2015   CLINICAL DATA:  Right upper quadrant pain since 02/10/2015.  EXAM: ULTRASOUND ABDOMEN COMPLETE  COMPARISON:  Right upper quadrant ultrasound 10/24/2014.  FINDINGS: Gallbladder: There are some small, gravel type stones within the gallbladder but no gallbladder wall thickening or pericholecystic fluid. Sonographer reports negative Murphy's sign.  Common bile duct: Diameter: 0.5 cm. There is some increased echogenicity within the common bile duct worrisome for the presence of sludge or possibly tiny stones.  Liver: No focal lesion identified. Echogenicity appears somewhat increased. No biliary ductal dilatation is seen.  IVC: No abnormality visualized.  Pancreas: Visualized portion unremarkable.  Spleen: Size and appearance within normal limits.  Right Kidney: Length: 10.2 cm. Echogenicity within normal limits. No mass or hydronephrosis visualized.  Left Kidney: Length: 9.9 cm. Echogenicity within normal limits. No hydronephrosis visualized.  1.1 cm in diameter cyst in the lower pole is incidentally noted.  Abdominal aorta: No aneurysm visualized.  Other findings: None.  IMPRESSION:  Gallstones without evidence of cholecystitis.  Likely sludge or tiny stones within the common bile duct without intra or extrahepatic biliary ductal dilatation visualized.  Mildly increased echogenicity of the liver compatible with fatty infiltration.   Electronically Signed   By: Inge Rise M.D.   On: 02/11/2015 07:13     EKG Interpretation   Date/Time:  Tuesday February 11 2015 03:56:00 EDT Ventricular Rate:  73 PR Interval:  199 QRS Duration: 95 QT Interval:  399 QTC Calculation: 440 R Axis:   -42 Text Interpretation:  Sinus rhythm Left anterior fascicular block Abnormal  R-wave progression, early transition Left ventricular hypertrophy Anterior  Q waves, possibly due to LVH Baseline wander in lead(s) V2 V5 When  compared with ECG of 10/24/2014, No significant change was found Confirmed  by [facility]  MD, Rondi Ivy ([id number]) on 02/11/2015 6:12:50 AM      MDM   Final diagnoses:  Abdominal pain, unspecified abdominal location  Obstructive jaundice  Calculus of gallbladder with biliary obstruction but without cholecystitis  Renal insufficiency  Normochromic anemia    Right upper quadrant pain in patient with known history of cholelithiasis.laboratory workup had been completed by the time I saw her and she has significant elevation of transaminases, alkaline phosphatase, and total bilirubin strongly suggestive of choledocholithiasis with obstructive jaundice. She is also noted to have a mildly elevated lipase. Chronic anemia is unchanged from baseline and chronic renal insufficiency is also unchanged from baseline. She will be sent for ultrasound to evaluate for possible choledocholithiasis. Old records are reviewed and ultrasound on March 31 showed numerous small stones. She did have several office visits following that I did not see any mention of gallstones during those visits.Case has been discussed with internal medicine resident on-call and she will be admitted to the medicine service. Of  note, she is anticoagulated on warfarin for history of DVT with INR pending. INR will need to be corrected before ERCP can be done.    Delora Fuel, MD [id number] [phone]

## 2015-02-11 NOTE — ED Notes (Signed)
Transported to ultrasound

## 2015-02-11 NOTE — H&P (Signed)
Date: 02/11/2015               Patient Name:  Chelsea Mcdaniel MRN: [id number]  DOB: [birth year] Age / Sex: 81 y.o., female   PCP: Francesca Oman, DO         Medical Service: Internal Medicine Teaching Service         Attending Physician: Dr. Aldine Contes, MD    First Contact: Dr. Berline Lopes Pager: [phone]  Second Contact: Dr. Natasha Bence Pager: [phone]       After Hours (After 5p/  First Contact Pager: [phone]  weekends / holidays): Second Contact Pager: [phone]   Chief Complaint: abdominal pain  History of Present Illness: Chelsea Mcdaniel is a 81 yo female with PMH of DM2, HTN, HLD, DVT, and CKD3 who presents to the ED with CC of abdominal pain.  She reports that yesterday she started having 10/10 sharp RUQ abdominal pain that radiated to the back.  She endorses nausea without any vomiting.  She denied any other associated symptoms and denies any fever, chills, diarrhea or constipation.  Of note she did have a similar presentation in march where the was diagnosed with cholelithiasis without cholecystitis.  She has not had a cholecystectomy.  She has been compliant with her home medications including warfarin for her Hx of DVT.  Meds: No current facility-administered medications for this encounter.   Current Outpatient Prescriptions  Medication Sig Dispense Refill  . acetaminophen (TYLENOL) 325 MG tablet Take 2 tablets (650 mg total) by mouth every 6 (six) hours as needed.    Marland Kitchen amLODipine (NORVASC) 10 MG tablet Take 1 tablet (10 mg total) by mouth daily. For blood pressure 90 tablet 3  . Calcium Carbonate-Vitamin D 600-400 MG-UNIT per tablet Take 1 tablet by mouth 2 (two) times daily.    . colchicine 0.6 MG tablet Take 1 tablet (0.6 mg total) by mouth daily. 14 tablet 0  . CRESTOR 5 MG tablet take 1 tablet by mouth at bedtime 90 tablet 1  . GLIPIZIDE XL 10 MG 24 hr tablet TAKE ONE TABLET BY MOUTH EVERY DAY 90 tablet 0  . LUMIGAN 0.01 % SOLN Place 1 drop into both eyes every  morning.     . metoprolol succinate (TOPROL-XL) 25 MG 24 hr tablet TAKE ONE-HALF TABLET BY MOUTH EVERY DAY 45 tablet 3  . sitaGLIPtin (JANUVIA) 25 MG tablet Take 1 tablet (25 mg total) by mouth daily. 30 tablet 2  . warfarin (COUMADIN) 5 MG tablet Take 1 tablet by mouth daily, except 1.5 mg on Mondays, Wednesdays, and Fridays (Patient taking differently: Take 5 mg by mouth daily. ) 36 tablet 3  . ACCU-CHEK AVIVA PLUS test strip Use 1 time daily to check blood sugar. 100 each 11  . ACCU-CHEK SOFTCLIX LANCETS lancets Use 1 time daily to check blood sugar. 100 each 1  . benazepril (LOTENSIN) 20 MG tablet Take 1 tablet (20 mg total) by mouth daily. (Patient not taking: Reported on 02/11/2015) 30 tablet 2  . Blood Glucose Monitoring Suppl (ACCU-CHEK AVIVA) device Use as instructed 1 each 0  . HYDROcodone-acetaminophen (NORCO/VICODIN) 5-325 MG per tablet Take 1 tablet by mouth every 6 (six) hours as needed for moderate pain. (Patient not taking: Reported on 02/11/2015) 20 tablet 0  . senna-docusate (SENOKOT-S) 8.6-50 MG per tablet Take 1 tablet by mouth at bedtime as needed for mild constipation. (Patient not taking: Reported on 02/11/2015) 30 tablet 2  . simethicone (GAS-X) 80 MG chewable  tablet Chew 1 tablet (80 mg total) by mouth every 6 (six) hours as needed for flatulence. (Patient not taking: Reported on 02/11/2015) 30 tablet 0    Allergies: Allergies as of 02/11/2015  . (No Known Allergies)   Past Medical History  Diagnosis Date  . Diabetes mellitus   . Hyperlipidemia   . Hypertension   . CVA (cerebrovascular accident)  October 2007     CT of the head atrophy with multiple remote insults noted but no definite acute findings  . Blindness of left eye      likely related to stroke, left cataract removed from that eye with complications  . Anemia      normocytic anemia with baseline hemoglobin 10-11  . Chronic kidney disease 2006     left renal artery stenosis with probable hemodynamic  significance, kidneys are normal in morphology without focal lesions or hydronephrosis this is based but cannot A. of the abdomen with and without contrast done on the 31st 2006  . Glaucoma   . Personal history of DVT (deep vein thrombosis) 08/11/2010   Past Surgical History  Procedure Laterality Date  . Transthoracic echocardiogram   May 2008     left ventricular systolic function normal EF estimated range of 55-60%, no definite diagnostic evidence of left ventricular regional wall motion abnormalities, Doppler parameters consistent with abnormal left ventricular relaxation, findings suggestive of possible bicuspid aortic valve and aortic valve thickness mildly increase   Family History  Problem Relation Age of Onset  . Heart disease Mother   . Diabetes Mother   . Hyperlipidemia Mother   . Hypertension Mother   . Heart disease Father   . Diabetes Father   . Hyperlipidemia Father   . Hypertension Father    History   Social History  . Marital Status: Widowed    Spouse Name: N/A  . Number of Children: N/A  . Years of Education: N/A   Occupational History  . Not on file.   Social History Main Topics  . Smoking status: Never Smoker   . Smokeless tobacco: Never Used  . Alcohol Use: No  . Drug Use: No  . Sexual Activity: No   Other Topics Concern  . Not on file   Social History Narrative    Patient is a widow. She has 11 children 5 of who are living. She is retired in 1993 from [employer]. She denies tobacco alcohol or drug use.    Review of Systems: Review of Systems  Constitutional: Negative for fever, chills, weight loss and malaise/fatigue.  Respiratory: Negative for cough, shortness of breath and wheezing.   Cardiovascular: Negative for chest pain and leg swelling.  Gastrointestinal: Positive for nausea and abdominal pain. Negative for heartburn, vomiting, diarrhea, constipation, blood in stool and melena.  Genitourinary: Negative for dysuria and urgency.    Musculoskeletal: Positive for back pain.  Skin: Negative for rash.  Neurological: Negative for dizziness and headaches.     Physical Exam: Blood pressure 131/73, pulse 64, temperature 97.9 F (36.6 C), temperature source Oral, resp. rate 23, SpO2 96 %. Physical Exam  Constitutional: She is well-developed, well-nourished, and in no distress.  HENT:  Head: Normocephalic and atraumatic.  Eyes: Conjunctivae are normal.  Cardiovascular: Normal rate, regular rhythm and normal heart sounds.   No murmur heard. Pulmonary/Chest: Effort normal and breath sounds normal.  Abdominal: Soft. Bowel sounds are normal. She exhibits no distension. There is no tenderness. There is no rebound and no guarding.  Musculoskeletal: She  exhibits no edema.  Skin: Skin is warm and dry.  Nursing note and vitals reviewed.    Lab results: Basic Metabolic Panel:  Recent Labs  02/11/15 0400  NA 136  K 4.1  CL 104  CO2 22  GLUCOSE 254*  BUN 20  CREATININE 1.46*  CALCIUM 9.2   Liver Function Tests:  Recent Labs  02/11/15 0400  AST 154*  ALT 143*  ALKPHOS 329*  BILITOT 5.1*  PROT 7.3  ALBUMIN 3.6    Recent Labs  02/11/15 0400  LIPASE 97*   No results for input(s): AMMONIA in the last 72 hours. CBC:  Recent Labs  02/11/15 0400 02/11/15 0605  WBC 6.1  --   NEUTROABS  --  5.6  HGB 10.4*  --   HCT 31.0*  --   MCV 89.1  --   PLT 223  --    Coagulation:  Recent Labs  02/11/15 0605  LABPROT 32.8*  INR 3.29*   Urinalysis:  Recent Labs  02/11/15 0515  COLORURINE ORANGE*  LABSPEC 1.024  PHURINE 5.5  GLUCOSEU 500*  HGBUR TRACE*  BILIRUBINUR LARGE*  KETONESUR NEGATIVE  PROTEINUR >300*  UROBILINOGEN 1.0  NITRITE NEGATIVE  LEUKOCYTESUR SMALL*     Imaging results:  US Abdomen Complete  02/11/2015   CLINICAL DATA:  Right upper quadrant pain since 02/10/2015.  EXAM: ULTRASOUND ABDOMEN COMPLETE  COMPARISON:  Right upper quadrant ultrasound 10/24/2014.  FINDINGS:  Gallbladder: There are some small, gravel type stones within the gallbladder but no gallbladder wall thickening or pericholecystic fluid. Sonographer reports negative Murphy's sign.  Common bile duct: Diameter: 0.5 cm. There is some increased echogenicity within the common bile duct worrisome for the presence of sludge or possibly tiny stones.  Liver: No focal lesion identified. Echogenicity appears somewhat increased. No biliary ductal dilatation is seen.  IVC: No abnormality visualized.  Pancreas: Visualized portion unremarkable.  Spleen: Size and appearance within normal limits.  Right Kidney: Length: 10.2 cm. Echogenicity within normal limits. No mass or hydronephrosis visualized.  Left Kidney: Length: 9.9 cm. Echogenicity within normal limits. No hydronephrosis visualized. 1.1 cm in diameter cyst in the lower pole is incidentally noted.  Abdominal aorta: No aneurysm visualized.  Other findings: None.  IMPRESSION: Gallstones without evidence of cholecystitis.  Likely sludge or tiny stones within the common bile duct without intra or extrahepatic biliary ductal dilatation visualized.  Mildly increased echogenicity of the liver compatible with fatty infiltration.   Electronically Signed   By: Inge Rise M.D.   On: 02/11/2015 07:13    Other results: EKG: NSR, left axis deviation, no acute ST or T wave changes, no q waves unchanged from March.  Assessment & Plan by Problem:   Choledocholithiasis - History and labs c/w choledocholithiasis with elevated alk phos, bilirubin and biliary pain.  U/S however shows sludge and tiny stones in common bile duct without ductal dilitation. Patient may need to have a ERCP for stone removal but possible could first have a MRCP to confirm stone presence prior to this procedure, especially given need to A/C reversal for ERCP. - Consult GI for recommendations of ERCP vs MRCP. - Lipase is mildly elevated at 97, given patient's age will hydrate cautiously. - Trend  LFTS - Diet NPO     Diabetes Type 2, well controlled with CKD - Will hold home oral hypoglycemics and start SSI    Hyperlipidemia - Crestor    Anemia of chronic disease - Stable at baseline    Essential hypertension -  Currently normotensive. - Continue home medications    History of CVA (cerebrovascular accident) - Per chart review, It appears she is treated with long term A/C after multiple CVA's on ASA and plavix and when a DVT was diagnosed in 1993 it was determined to keep her on life long A/C.    Long-term (current) use of anticoagulants - On life long A/C for DVT.  INR currently supratheraputic at 3.29.  Will hold and await GI eval for given Vit K.    CKD (chronic kidney disease) stage 3, GFR 30-59 ml/min -SCr at 1.46 which is c/w previous values. If needed could restart ACEi, May consider lower dose.   Diet: NPO DVT: Warfarin Dispo: Disposition is deferred at this time, awaiting improvement of current medical problems. Anticipated discharge in approximately 2 day(s).   The patient does have a current PCP Francesca Oman, DO) and does need an [facility] hospital follow-up appointment after discharge.  The patient does not have transportation limitations that hinder transportation to clinic appointments.  Signed: Lucious Groves, DO 02/11/2015, 9:04 AM

## 2015-02-11 NOTE — Consult Note (Signed)
Reason for Consult: choledocholithiasis Referring Physician: Dr. Delfin Edis   HPI: Chelsea Mcdaniel is a 81 year old female with a history diabetes mellitus, hypertension, CKD, DVT on coumadin presenting with RUQ abdominal pain.  Symptoms began this morning around 2 AM.  She endorses to like symptoms 2 nights ago and back in March which resolved on their own.  No aggravating or alleviating factors.  No modifying factors.  Associated with nausea, no vomiting fevers or chills.  Pain is characterized as "a killer."  Severe in severity.  Time pattern is constant although now completely resolved with IV pain medication.  She was seen in the ED in March and had an [place] which showed gallstones.  After reviewing medicine notes, patient has complained of intermittent abdominal pain since then.   Work up shows a abnormal LFTs with T bilirubin 5.1.  No white count.  Abdominal US significant for stones, likely sludge/stones within the CBD without CBD dilatation.  Lipase 97.  We have been asked to evaluate for a cholecystectomy.    INR today is 3.29.    Past Medical History  Diagnosis Date  . Diabetes mellitus   . Hyperlipidemia   . Hypertension   . CVA (cerebrovascular accident)  October 2007     CT of the head atrophy with multiple remote insults noted but no definite acute findings  . Blindness of left eye      likely related to stroke, left cataract removed from that eye with complications  . Anemia      normocytic anemia with baseline hemoglobin 10-11  . Chronic kidney disease 2006     left renal artery stenosis with probable hemodynamic significance, kidneys are normal in morphology without focal lesions or hydronephrosis this is based but cannot A. of the abdomen with and without contrast done on the 31st 2006  . Glaucoma   . Personal history of DVT (deep vein thrombosis) 08/11/2010  . Calculus gallbladder and bile duct with cholecystitis with obstruction 01/2015    Past Surgical History  Procedure  Laterality Date  . Transthoracic echocardiogram   May 2008     left ventricular systolic function normal EF estimated range of 55-60%, no definite diagnostic evidence of left ventricular regional wall motion abnormalities, Doppler parameters consistent with abnormal left ventricular relaxation, findings suggestive of possible bicuspid aortic valve and aortic valve thickness mildly increase  . Abdominal hysterectomy    . Eye surgery      Family History  Problem Relation Age of Onset  . Heart disease Mother   . Diabetes Mother   . Hyperlipidemia Mother   . Hypertension Mother   . Heart disease Father   . Diabetes Father   . Hyperlipidemia Father   . Hypertension Father     Social History:  reports that she has never smoked. She has never used smokeless tobacco. She reports that she does not drink alcohol or use illicit drugs.  Allergies: No Known Allergies  Medications:  Scheduled Meds: . amLODipine  10 mg Oral Daily  . colchicine  0.6 mg Oral Daily  . latanoprost  1 drop Both Eyes Daily  . metoprolol succinate  12.5 mg Oral Daily  . rosuvastatin  5 mg Oral QHS   Continuous Infusions: . sodium chloride 100 mL/hr at 02/11/15 1216   PRN Meds:.HYDROmorphone (DILAUDID) injection   Results for orders placed or performed during the hospital encounter of 02/11/15 (from the past 48 hour(s))  Lipase, blood     Status: Abnormal  Collection Time: 02/11/15  4:00 AM  Result Value Ref Range   Lipase 97 (H) 22 - 51 U/L  Comprehensive metabolic panel     Status: Abnormal   Collection Time: 02/11/15  4:00 AM  Result Value Ref Range   Sodium 136 135 - 145 mmol/L   Potassium 4.1 3.5 - 5.1 mmol/L   Chloride 104 101 - 111 mmol/L   CO2 22 22 - 32 mmol/L   Glucose, Bld 254 (H) 65 - 99 mg/dL   BUN 20 6 - 20 mg/dL   Creatinine, Ser 1.46 (H) 0.44 - 1.00 mg/dL   Calcium 9.2 8.9 - 10.3 mg/dL   Total Protein 7.3 6.5 - 8.1 g/dL   Albumin 3.6 3.5 - 5.0 g/dL   AST 154 (H) 15 - 41 U/L   ALT 143  (H) 14 - 54 U/L   Alkaline Phosphatase 329 (H) 38 - 126 U/L   Total Bilirubin 5.1 (H) 0.3 - 1.2 mg/dL   GFR calc non Af Amer 32 (L) >60 mL/min   GFR calc Af Amer 37 (L) >60 mL/min    Comment: (NOTE) The eGFR has been calculated using the CKD EPI equation. This calculation has not been validated in all clinical situations. eGFR's persistently <60 mL/min signify possible Chronic Kidney Disease.    Anion gap 10 5 - 15  CBC     Status: Abnormal   Collection Time: 02/11/15  4:00 AM  Result Value Ref Range   WBC 6.1 4.0 - 10.5 K/uL   RBC 3.48 (L) 3.87 - 5.11 MIL/uL   Hemoglobin 10.4 (L) 12.0 - 15.0 g/dL   HCT 31.0 (L) 36.0 - 46.0 %   MCV 89.1 78.0 - 100.0 fL   MCH 29.9 26.0 - 34.0 pg   MCHC 33.5 30.0 - 36.0 g/dL   RDW 13.0 11.5 - 15.5 %   Platelets 223 150 - 400 K/uL  Urinalysis, Routine w reflex microscopic (not at [facility])     Status: Abnormal   Collection Time: 02/11/15  5:15 AM  Result Value Ref Range   Color, Urine ORANGE (A) YELLOW    Comment: BIOCHEMICALS MAY BE AFFECTED BY COLOR   APPearance CLEAR CLEAR   Specific Gravity, Urine 1.024 1.005 - 1.030   pH 5.5 5.0 - 8.0   Glucose, UA 500 (A) NEGATIVE mg/dL   Hgb urine dipstick TRACE (A) NEGATIVE   Bilirubin Urine LARGE (A) NEGATIVE   Ketones, ur NEGATIVE NEGATIVE mg/dL   Protein, ur >300 (A) NEGATIVE mg/dL   Urobilinogen, UA 1.0 0.0 - 1.0 mg/dL   Nitrite NEGATIVE NEGATIVE   Leukocytes, UA SMALL (A) NEGATIVE  Urine microscopic-add on     Status: Abnormal   Collection Time: 02/11/15  5:15 AM  Result Value Ref Range   Squamous Epithelial / LPF FEW (A) RARE   WBC, UA 7-10 <3 WBC/hpf   RBC / HPF 3-6 <3 RBC/hpf   Bacteria, UA RARE RARE   Casts HYALINE CASTS (A) NEGATIVE  Protime-INR     Status: Abnormal   Collection Time: 02/11/15  6:05 AM  Result Value Ref Range   Prothrombin Time 32.8 (H) 11.6 - 15.2 seconds   INR 3.29 (H) 0.00 - 1.49  Differential     Status: Abnormal   Collection Time: 02/11/15  6:05 AM  Result Value  Ref Range   Neutrophils Relative % 82 (H) 43 - 77 %   Neutro Abs 5.6 1.7 - 7.7 K/uL   Lymphocytes Relative 9 (L) 12 -  46 %   Lymphs Abs 0.6 (L) 0.7 - 4.0 K/uL   Monocytes Relative 8 3 - 12 %   Monocytes Absolute 0.6 0.1 - 1.0 K/uL   Eosinophils Relative 1 0 - 5 %   Eosinophils Absolute 0.0 0.0 - 0.7 K/uL   Basophils Relative 0 0 - 1 %   Basophils Absolute 0.0 0.0 - 0.1 K/uL    US Abdomen Complete  02/11/2015   CLINICAL DATA:  Right upper quadrant pain since 02/10/2015.  EXAM: ULTRASOUND ABDOMEN COMPLETE  COMPARISON:  Right upper quadrant ultrasound 10/24/2014.  FINDINGS: Gallbladder: There are some small, gravel type stones within the gallbladder but no gallbladder wall thickening or pericholecystic fluid. Sonographer reports negative Murphy's sign.  Common bile duct: Diameter: 0.5 cm. There is some increased echogenicity within the common bile duct worrisome for the presence of sludge or possibly tiny stones.  Liver: No focal lesion identified. Echogenicity appears somewhat increased. No biliary ductal dilatation is seen.  IVC: No abnormality visualized.  Pancreas: Visualized portion unremarkable.  Spleen: Size and appearance within normal limits.  Right Kidney: Length: 10.2 cm. Echogenicity within normal limits. No mass or hydronephrosis visualized.  Left Kidney: Length: 9.9 cm. Echogenicity within normal limits. No hydronephrosis visualized. 1.1 cm in diameter cyst in the lower pole is incidentally noted.  Abdominal aorta: No aneurysm visualized.  Other findings: None.  IMPRESSION: Gallstones without evidence of cholecystitis.  Likely sludge or tiny stones within the common bile duct without intra or extrahepatic biliary ductal dilatation visualized.  Mildly increased echogenicity of the liver compatible with fatty infiltration.   Electronically Signed   By: Inge Rise M.D.   On: 02/11/2015 07:13    Review of Systems  All other systems reviewed and are negative.  Blood pressure 198/66,  pulse 76, temperature 97.8 F (36.6 C), temperature source Oral, resp. rate 21, SpO2 97 %. Physical Exam  Constitutional: She is oriented to person, place, and time. She appears well-developed and well-nourished. No distress.  HENT:  Head: Normocephalic and atraumatic.  Mouth/Throat: No oropharyngeal exudate.  Cardiovascular: Normal rate, regular rhythm, normal heart sounds and intact distal pulses.  Exam reveals no gallop and no friction rub.   No murmur heard. Respiratory: Effort normal and breath sounds normal. No respiratory distress. She has no wheezes. She has no rales. She exhibits no tenderness.  GI: Soft. Bowel sounds are normal. She exhibits no distension and no mass. There is no rebound and no guarding.  Mild TTP LUQ, no murphy's sign or guarding.   Musculoskeletal: Normal range of motion. She exhibits no edema or tenderness.  Neurological: She is alert and oriented to person, place, and time.  Skin: Skin is warm and dry. No rash noted. She is not diaphoretic. No erythema. No pallor.  Psychiatric: She has a normal mood and affect. Her behavior is normal. Judgment and thought content normal.    Assessment/Plan: Symptomatic cholelithiasis Mild pancreatitis Choledocholithiasis  -await GIs work up MRCP/ERCP then cholecystectomy, although, the patient may not wish to proceed with surgery.  -follow LFTs -pain control, antiemetics, IVF -agree with holding/reversing coumadin -thank you for the consult.  Surgery will follow along.  Daune Divirgilio ANP-BC 02/11/2015, 12:56 PM

## 2015-02-11 NOTE — ED Notes (Signed)
EMS reported CBG 285 BP 146/86

## 2015-02-11 NOTE — Consult Note (Signed)
[facility] Consult: 9:15 AM 02/11/2015  LOS: 0 days    Referring Provider:  Dr Dareen Piano.    Primary Care Physician:  Duwaine Maxin, DO Primary Gastroenterologist:  unassigned    Reason for Consultation:  Pancreatits, cholelithiasis.    HPI: Chelsea Mcdaniel is a 81 y.o. female.  Hx DVT on Coumadin.  Partially blind. Osteoporosis.  2 nights ago she woke up with right abd pain.  Lasted a few hours.  Same occurrence early this AM, pain lasted til received pain meds in ED, + nausea but no emesis.  The pain is similar to that occurring in 09/2014, LFTs normal then, cholelithiasis and umbilical hernia on CT 1/61/09  In ED today:  Alk phos 329, t bili 5.1, AST/ALT 154/143.  Lipase 20. Glucose 254. INR 3. 2.   Ultrasound shows sludge and gravel in GB but no obvious cholecystitis. Chronic kidney disease.     Past Medical History  Diagnosis Date  . Diabetes mellitus   . Hyperlipidemia   . Hypertension   . CVA (cerebrovascular accident)  October 2007     CT of the head atrophy with multiple remote insults noted but no definite acute findings  . Blindness of left eye      likely related to stroke, left cataract removed from that eye with complications  . Anemia      normocytic anemia with baseline hemoglobin 10-11  . Chronic kidney disease 2006     left renal artery stenosis with probable hemodynamic significance, kidneys are normal in morphology without focal lesions or hydronephrosis this is based but cannot A. of the abdomen with and without contrast done on the 31st 2006  . Glaucoma   . Personal history of DVT (deep vein thrombosis) 08/11/2010    Past Surgical History  Procedure Laterality Date  . Transthoracic echocardiogram   May 2008     left ventricular systolic function normal EF estimated range of  55-60%, no definite diagnostic evidence of left ventricular regional wall motion abnormalities, Doppler parameters consistent with abnormal left ventricular relaxation, findings suggestive of possible bicuspid aortic valve and aortic valve thickness mildly increase    Prior to Admission medications   Medication Sig Start Date End Date Taking? Authorizing Provider  acetaminophen (TYLENOL) 325 MG tablet Take 2 tablets (650 mg total) by mouth every 6 (six) hours as needed. 08/22/14  Yes Marjan Rabbani, MD  amLODipine (NORVASC) 10 MG tablet Take 1 tablet (10 mg total) by mouth daily. For blood pressure 09/25/14 09/24/15 Yes Alex Ronnie Derby, DO  Calcium Carbonate-Vitamin D 600-400 MG-UNIT per tablet Take 1 tablet by mouth 2 (two) times daily.   Yes Historical Provider, MD  colchicine 0.6 MG tablet Take 1 tablet (0.6 mg total) by mouth daily. 11/27/14  Yes Francesca Oman, DO  CRESTOR 5 MG tablet take 1 tablet by mouth at bedtime 12/19/14  Yes Francesca Oman, DO  GLIPIZIDE XL 10 MG 24 hr tablet TAKE ONE TABLET BY MOUTH EVERY DAY 02/07/15  Yes Francesca Oman, DO  LUMIGAN 0.01 % SOLN  Place 1 drop into both eyes every morning.  12/05/13  Yes Historical Provider, MD  metoprolol succinate (TOPROL-XL) 25 MG 24 hr tablet TAKE ONE-HALF TABLET BY MOUTH EVERY DAY 09/13/14  Yes Nischal Narendra, MD  sitaGLIPtin (JANUVIA) 25 MG tablet Take 1 tablet (25 mg total) by mouth daily. 01/08/15  Yes Courtney Paris, MD  warfarin (COUMADIN) 5 MG tablet Take 1 tablet by mouth daily, except 1.5 mg on Mondays, Wednesdays, and Fridays Patient taking differently: Take 5 mg by mouth daily.  10/14/14  Yes Doneen Poisson, MD  ACCU-CHEK AVIVA PLUS test strip Use 1 time daily to check blood sugar. 11/06/14   Yolanda Manges, DO  ACCU-CHEK SOFTCLIX LANCETS lancets Use 1 time daily to check blood sugar. 08/13/14   Yolanda Manges, DO  benazepril (LOTENSIN) 20 MG tablet Take 1 tablet (20 mg total) by mouth daily. Patient not taking: Reported on 02/11/2015 01/15/15    Courtney Paris, MD  Blood Glucose Monitoring Suppl (ACCU-CHEK AVIVA) device Use as instructed 02/15/14 02/15/15  Yolanda Manges, DO  HYDROcodone-acetaminophen (NORCO/VICODIN) 5-325 MG per tablet Take 1 tablet by mouth every 6 (six) hours as needed for moderate pain. Patient not taking: Reported on 02/11/2015 10/24/14   Elwin Mocha, MD  senna-docusate (SENOKOT-S) 8.6-50 MG per tablet Take 1 tablet by mouth at bedtime as needed for mild constipation. Patient not taking: Reported on 02/11/2015 01/08/15   Courtney Paris, MD  simethicone (GAS-X) 80 MG chewable tablet Chew 1 tablet (80 mg total) by mouth every 6 (six) hours as needed for flatulence. Patient not taking: Reported on 02/11/2015 01/08/15   Courtney Paris, MD    Scheduled Meds:  Infusions:  PRN Meds:    Allergies as of 02/11/2015  . (No Known Allergies)    Family History  Problem Relation Age of Onset  . Heart disease Mother   . Diabetes Mother   . Hyperlipidemia Mother   . Hypertension Mother   . Heart disease Father   . Diabetes Father   . Hyperlipidemia Father   . Hypertension Father     History   Social History  . Marital Status: Widowed    Spouse Name: N/A  . Number of Children: N/A  . Years of Education: N/A   Occupational History  . Not on file.   Social History Main Topics  . Smoking status: Never Smoker   . Smokeless tobacco: Never Used  . Alcohol Use: No  . Drug Use: No  . Sexual Activity: No   Other Topics Concern  . Not on file   Social History Narrative    Patient is a widow. She has 11 children 5 of who are living. She is retired in 1993 from [employer]. She denies tobacco alcohol or drug use.    REVIEW OF SYSTEMS: Constitutional:  No general weakness.  No weight loss.   ENT:  No nose bleeds Pulm:  No sob or cough.   CV:  No palpitations, no LE edema.  GU:  No hematuria, no frequency GI:  Per HPI Heme:  No issues with bleeding/bruising   Transfusions:  None ever.  Neuro:  No headaches, no  peripheral tingling or numbness Derm:  No itching, no rash or sores.  Endocrine:  No sweats or chills.  No polyuria or dysuria Immunization:  Not queried.  Travel:  None beyond local counties in last few months.    PHYSICAL EXAM: Vital signs in last 24 hours: Filed Vitals:  02/11/15 0845  BP: 131/73  Pulse: 64  Temp:   Resp: 23   Wt Readings from Last 3 Encounters:  02/03/15 155 lb 11.2 oz (70.625 kg)  01/14/15 156 lb 6.4 oz (70.943 kg)  01/08/15 154 lb 11.2 oz (70.171 kg)    General: pleasant, alert Head:  No asymmetry or swelling  Eyes:  No icterus or pallor.  Strabismus on left Ears:  Not HOH  Nose:  No discharge or congestion Mouth:  Clear , moist, upper full denture, no molars below. Neck:  No mass or HSM Lungs:  Clear bil.  No cough or labored breathing Heart: RRR.  No MRG Abdomen:  Soft, ND.  Minor if any right mid to lower belly tenderness.  Umbilical hernia apparent only with valsalva.   Rectal: deferred   Musc/Skeltl: arthritic changes in hands Extremities:  No CCE  Neurologic:  Oriented x 3, no tremor, no limb weakness Skin:  No rash, sores or telangectasia.  Tattoos:  none Nodes:  No adenopathy.     Psych:  Pleasant, relaxed.   Intake/Output from previous day: 07/18 0701 - 07/19 0700 In: -  Out: 100 [Urine:100] Intake/Output this shift:    LAB RESULTS:  Recent Labs  02/11/15 0400  WBC 6.1  HGB 10.4*  HCT 31.0*  PLT 223   BMET Lab Results  Component Value Date   NA 136 02/11/2015   NA 139 02/03/2015   NA 135 01/14/2015   K 4.1 02/11/2015   K 5.4* 02/03/2015   K 4.8 01/14/2015   CL 104 02/11/2015   CL 102 02/03/2015   CL 103 01/14/2015   CO2 22 02/11/2015   CO2 26 02/03/2015   CO2 23 01/14/2015   GLUCOSE 254* 02/11/2015   GLUCOSE 153* 02/03/2015   GLUCOSE 256* 01/14/2015   BUN 20 02/11/2015   BUN 25* 02/03/2015   BUN 19 01/14/2015   CREATININE 1.46* 02/11/2015   CREATININE 1.91* 02/03/2015   CREATININE 1.25* 01/14/2015    CALCIUM 9.2 02/11/2015   CALCIUM 9.1 02/03/2015   CALCIUM 9.1 01/14/2015   LFT  Recent Labs  02/11/15 0400  PROT 7.3  ALBUMIN 3.6  AST 154*  ALT 143*  ALKPHOS 329*  BILITOT 5.1*   PT/INR Lab Results  Component Value Date   INR 3.29* 02/11/2015   INR 2.10 02/03/2015   INR 3.20 01/06/2015   Hepatitis Panel No results for input(s): HEPBSAG, HCVAB, HEPAIGM, HEPBIGM in the last 72 hours. C-Diff No components found for: CDIFF Lipase     Component Value Date/Time   LIPASE 97* 02/11/2015 0400    Drugs of Abuse  No results found for: LABOPIA, COCAINSCRNUR, LABBENZ, AMPHETMU, THCU, LABBARB   RADIOLOGY STUDIES: US Abdomen Complete  02/11/2015   CLINICAL DATA:  Right upper quadrant pain since 02/10/2015.  EXAM: ULTRASOUND ABDOMEN COMPLETE  COMPARISON:  Right upper quadrant ultrasound 10/24/2014.  FINDINGS: Gallbladder: There are some small, gravel type stones within the gallbladder but no gallbladder wall thickening or pericholecystic fluid. Sonographer reports negative Murphy's sign.  Common bile duct: Diameter: 0.5 cm. There is some increased echogenicity within the common bile duct worrisome for the presence of sludge or possibly tiny stones.  Liver: No focal lesion identified. Echogenicity appears somewhat increased. No biliary ductal dilatation is seen.  IVC: No abnormality visualized.  Pancreas: Visualized portion unremarkable.  Spleen: Size and appearance within normal limits.  Right Kidney: Length: 10.2 cm. Echogenicity within normal limits. No mass or hydronephrosis visualized.  Left Kidney: Length: 9.9  cm. Echogenicity within normal limits. No hydronephrosis visualized. 1.1 cm in diameter cyst in the lower pole is incidentally noted.  Abdominal aorta: No aneurysm visualized.  Other findings: None.  IMPRESSION: Gallstones without evidence of cholecystitis.  Likely sludge or tiny stones within the common bile duct without intra or extrahepatic biliary ductal dilatation visualized.   Mildly increased echogenicity of the liver compatible with fatty infiltration.   Electronically Signed   By: Inge Rise M.D.   On: 02/11/2015 07:13    ENDOSCOPIC STUDIES: None found.  Pt recalls colonoscopy age 52 (no pathology found).   IMPRESSION:   *  Biliary pancreatitis. No ductal dilatation on ultrasound.   *  Cholelithiasis.    *  Normocytic anemia. May be due to CKD.    *  CKD, stage 4.   *  Anticoagulated for hs DVT.     PLAN:     *  cmet in AM,  Surgical consult. Clears.     Azucena Freed  02/11/2015,  Attending MD note:   I have taken a history, examined the patient, and reviewed the chart. I agree with the Advanced Practitioner's impression and recommendations. Acute abdominal pain c/w recurrent biliary colic, she is pain free now. I think she does not have a clinical pancreatitis, just passed a stone. Will need lap chole, would get MRCP while awaiting normalization of Protime. We will then decide if ERCP prior to lap chole . IV antibiotic coverage to prevent cholangitis.  [facility] Pager # [phone]

## 2015-02-12 LAB — COMPREHENSIVE METABOLIC PANEL
ALT: 117 U/L — ABNORMAL HIGH (ref 14–54)
AST: 139 U/L — ABNORMAL HIGH (ref 15–41)
Albumin: 2.9 g/dL — ABNORMAL LOW (ref 3.5–5.0)
Alkaline Phosphatase: 341 U/L — ABNORMAL HIGH (ref 38–126)
Anion gap: 9 (ref 5–15)
BUN: 14 mg/dL (ref 6–20)
CALCIUM: 8.4 mg/dL — AB (ref 8.9–10.3)
CHLORIDE: 107 mmol/L (ref 101–111)
CO2: 21 mmol/L — ABNORMAL LOW (ref 22–32)
Creatinine, Ser: 1.25 mg/dL — ABNORMAL HIGH (ref 0.44–1.00)
GFR calc Af Amer: 44 mL/min — ABNORMAL LOW (ref >60)
GFR calc non Af Amer: 38 mL/min — ABNORMAL LOW (ref >60)
Glucose, Bld: 68 mg/dL (ref 65–99)
POTASSIUM: 3.9 mmol/L (ref 3.5–5.1)
Sodium: 137 mmol/L (ref 135–145)
TOTAL PROTEIN: 6.5 g/dL (ref 6.5–8.1)
Total Bilirubin: 4.1 mg/dL — ABNORMAL HIGH (ref 0.3–1.2)

## 2015-02-12 LAB — CBC
HCT: 28.5 % — ABNORMAL LOW (ref 36.0–46.0)
Hemoglobin: 9.4 g/dL — ABNORMAL LOW (ref 12.0–15.0)
MCH: 29.5 pg (ref 26.0–34.0)
MCHC: 33 g/dL (ref 30.0–36.0)
MCV: 89.3 fL (ref 78.0–100.0)
PLATELETS: 223 10*3/uL (ref 150–400)
RBC: 3.19 MIL/uL — AB (ref 3.87–5.11)
RDW: 13.2 % (ref 11.5–15.5)
WBC: 5.3 10*3/uL (ref 4.0–10.5)

## 2015-02-12 LAB — LIPASE, BLOOD: LIPASE: 43 U/L (ref 22–51)

## 2015-02-12 LAB — PROTIME-INR
INR: 4.52 — AB (ref 0.00–1.49)
Prothrombin Time: 41.6 seconds — ABNORMAL HIGH (ref 11.6–15.2)

## 2015-02-12 MED ORDER — SENNOSIDES-DOCUSATE SODIUM 8.6-50 MG PO TABS
2.0000 | ORAL_TABLET | Freq: Once | ORAL | Status: AC
  Administered 2015-02-12: 2 via ORAL
  Filled 2015-02-12: qty 2

## 2015-02-12 NOTE — Progress Notes (Signed)
Daily Rounding Note  02/12/2015, 10:52 AM  LOS: 1 day   SUBJECTIVE:       Tolerating clears.  Feels better.  No abdominal pain or nausea.   OBJECTIVE:         Vital signs in last 24 hours:    Temp:  [98.3 F (36.8 C)-98.7 F (37.1 C)] 98.3 F (36.8 C) (07/20 0535) Pulse Rate:  [69-72] 72 (07/20 0535) Resp:  [18-20] 18 (07/20 0535) BP: (156-173)/(58-94) 167/61 mmHg (07/20 0535) SpO2:  [91 %-98 %] 96 % (07/20 0535) Weight:  [155 lb 11.2 oz (70.625 kg)] 155 lb 11.2 oz (70.625 kg) (07/20 0800) Last BM Date:  (pt states she is "not sure") Filed Weights   02/12/15 0800  Weight: 155 lb 11.2 oz (70.625 kg)   General: looks well though frail.  comfortable   Heart: RRR Chest: clear bil.  No cough or dyspnea Abdomen: soft, NT, ND.  BS active.   Extremities: no CCE Neuro/Psych:  Pleasant, oriented x 3.  Appropriate.   Intake/Output from previous day: 07/19 0701 - 07/20 0700 In: 442 [I.V.:442] Out: -   Intake/Output this shift: Total I/O In: 240 [P.O.:240] Out: -   Lab Results:  Recent Labs  02/11/15 0400 02/12/15 0403  WBC 6.1 5.3  HGB 10.4* 9.4*  HCT 31.0* 28.5*  PLT 223 223   BMET  Recent Labs  02/11/15 0400 02/12/15 0403  NA 136 137  K 4.1 3.9  CL 104 107  CO2 22 21*  GLUCOSE 254* 68  BUN 20 14  CREATININE 1.46* 1.25*  CALCIUM 9.2 8.4*   LFT  Recent Labs  02/11/15 0400 02/12/15 0403  PROT 7.3 6.5  ALBUMIN 3.6 2.9*  AST 154* 139*  ALT 143* 117*  ALKPHOS 329* 341*  BILITOT 5.1* 4.1*   PT/INR  Recent Labs  02/11/15 0605 02/12/15 0403  LABPROT 32.8* 41.6*  INR 3.29* 4.52*   Hepatitis Panel No results for input(s): HEPBSAG, HCVAB, HEPAIGM, HEPBIGM in the last 72 hours.  Studies/Results: US Abdomen Complete 02/11/2015   CLINICAL DATA:  Right upper quadrant pain since 02/10/2015.  EXAM: ULTRASOUND ABDOMEN COMPLETE  COMPARISON:  Right upper quadrant ultrasound 10/24/2014.   FINDINGS: Gallbladder: There are some small, gravel type stones within the gallbladder but no gallbladder wall thickening or pericholecystic fluid. Sonographer reports negative Murphy's sign.  Common bile duct: Diameter: 0.5 cm. There is some increased echogenicity within the common bile duct worrisome for the presence of sludge or possibly tiny stones.  Liver: No focal lesion identified. Echogenicity appears somewhat increased. No biliary ductal dilatation is seen.  IVC: No abnormality visualized.  Pancreas: Visualized portion unremarkable.  Spleen: Size and appearance within normal limits.  Right Kidney: Length: 10.2 cm. Echogenicity within normal limits. No mass or hydronephrosis visualized.  Left Kidney: Length: 9.9 cm. Echogenicity within normal limits. No hydronephrosis visualized. 1.1 cm in diameter cyst in the lower pole is incidentally noted.  Abdominal aorta: No aneurysm visualized.  Other findings: None.  IMPRESSION: Gallstones without evidence of cholecystitis.  Likely sludge or tiny stones within the common bile duct without intra or extrahepatic biliary ductal dilatation visualized.  Mildly increased echogenicity of the liver compatible with fatty infiltration.   Electronically Signed   By: Inge Rise M.D.   On: 02/11/2015 07:13   Mr Abdomen Mrcp Wo Cm Mr 3d Recon At Scanner 02/11/2015   CLINICAL DATA:  Right-sided abdominal pain. Nausea. Elevated liver function tests.  EXAM: MRI ABDOMEN WITHOUT CONTRAST  (INCLUDING MRCP)  TECHNIQUE: Multiplanar multisequence MR imaging of the abdomen was performed. Heavily T2-weighted images of the biliary and pancreatic ducts were obtained, and three-dimensional MRCP images were rendered by post processing.  COMPARISON:  Abdominal ultrasound of earlier today. Three hundred thirty-one CTA abdominal pelvic CT.  FINDINGS: Mild to moderate motion degradation on multiple pulse sequences.  Lower chest: Mild cardiomegaly, without pericardial or pleural effusion.   Hepatobiliary: Grossly normal noncontrast appearance the liver, without intrahepatic duct dilatation. Multiple gallstones. No intra or extrahepatic biliary ductal dilatation. The common duct measures 6 mm, including on image 44 of series 16. No evidence of choledocholithiasis or obstructive mass.  Pancreas: Suspicion of pancreas divisum, with a prominent dorsal duct entering the duodenum on image 25 of series 12. No acute pancreatitis.  Spleen: Normal  Adrenals/Urinary Tract: Normal adrenal glands. Normal right kidney for age. A posterior interpolar left renal lesion measures 10 mm on image 27 of series 19 and demonstrates precontrast T1 hyperintensity. 9 mm on coronal image 70 of the prior CT.  Stomach/Bowel: Normal colon, appendix, and terminal ileum. Normal abdominal small bowel without ascites.  Vascular/Lymphatic: Normal aortic caliber.  No abdominal adenopathy.  Other: No ascites.  Ventral abdominal wall laxity contains fat.  Musculoskeletal: No acute osseous abnormality.  IMPRESSION: 1. Cholelithiasis without biliary duct dilatation or choledocholithiasis. 2. Otherwise, motion degraded exam. 3. Probable pancreas divisum, without acute pancreatitis. 4. A lower pole left renal lesion is indeterminate on this precontrast study. This could represent a hemorrhagic cyst or a solid neoplasm. Given patient age and renal insufficiency, a potential clinical strategy includes renal ultrasound followup at 1 year.   Electronically Signed   By: Abigail Miyamoto M.D.   On: 02/11/2015 18:58   Scheduled Meds: . amLODipine  10 mg Oral Daily  . cefTRIAXone (ROCEPHIN)  IV  2 g Intravenous Q24H  . colchicine  0.6 mg Oral Daily  . latanoprost  1 drop Both Eyes Daily  . metoprolol succinate  12.5 mg Oral Daily  . rosuvastatin  5 mg Oral QHS   Continuous Infusions:  PRN Meds:.HYDROmorphone (DILAUDID) injection   ASSESMENT:   *  ? Mild biliary pancreatitis.  ? Pancreas Divisum, no CBD stones, no pancreatitis changes on  MRCP.  LFTs and Lipase improved.  Clinically improved.   *  Cholelithiasis.   *  Indeterminate left renal lesion.  Stage 4 CKD.    *  Chronic coumadin for hx DVT.     PLAN   *  Advance diet.  Can resume coumadin: defer dosing to medical team. Stop Rocephin. May be able to discharge later today if tolerates po.    *  Reasonable not to pursue cholecystectomy in pt with such advanced age who desires no surgery.    *  Will sign off.     Azucena Freed  02/12/2015, 10:52 AM Pager: [phone] Attending MD note:   I have taken a history,  and reviewed the chart. I agree with the Advanced Practitioner's impression and recommendations. Pt declines lap chole. Doing better. Will need low fat diet, will be happy to see in the future.  [facility] Pager # [phone]

## 2015-02-12 NOTE — Progress Notes (Signed)
Patient seen and examined. Case d/w residents in detail.  HPI: 81 y/o female with PMH of DM, HTN, HLD, DVT on a/c, CKD stage 3 who p/w abd pain * 1 day which was sharp, 10/10, RUQ, radiating to her back assoc with nausea. No vomiting, no diarrhea, no fevers, no chills, no CP, no sob. Today she is tolerating a PO diet and her pain has resolved.   Physical Exam: Gen: AAo*3, NAd CVS: RRR, normal heart sounds Lungs: CTA b/l Abd: soft, non tender, BS + Ext: no edema  Assessment and Plan: 81 y/o female with acute onset abdominal pain likely secondary to symptomatic choledocholithiasis.  Choledocholithiasis: - Patient with elevated transaminases, ALP and elevated lipase (possible assoc mild pancreatitis given pain radiating to her back) - LFTs improving today and  Lipase normalized - Abd u/s with sludge and tiny stones without ductal dilatation - MRCP with no ductal dilatation or bile duct stones - It is possible that she passed a stone recently. - GI and surgery recommendations appreciated - Patient refuses lap chole at this time - Will advance to a low fat diet and d.c home if tolerating PO

## 2015-02-12 NOTE — Progress Notes (Signed)
Subjective: Pt seen and examined today. Denies having abdominal pain, back pain, nausea, or vomiting. No CP or SOB. No other complaints.   Objective: Vital signs in last 24 hours: Filed Vitals:   02/11/15 2206 02/12/15 0535 02/12/15 0800 02/12/15 1357  BP: 156/58 167/61  173/57  Pulse: 71 72  71  Temp: 98.6 F (37 C) 98.3 F (36.8 C)  97.9 F (36.6 C)  TempSrc: Oral Oral  Oral  Resp: $Remo'18 18  18  'tOuGV$ Height:   '5\' 2"'$  (1.575 m)   Weight:   155 lb 11.2 oz (70.625 kg)   SpO2: 95% 96%  98%   Weight change:   Intake/Output Summary (Last 24 hours) at 02/12/15 1604 Last data filed at 02/12/15 1400  Gross per 24 hour  Intake    480 ml  Output      0 ml  Net    480 ml   Physical Exam  Constitutional: She is well-developed, well-nourished, and in no distress.  HENT:  Head: Normocephalic and atraumatic.  Eyes: Conjunctivae are normal.  Cardiovascular: Normal rate, regular rhythm and normal heart sounds.  No murmur heard. Pulmonary/Chest: Effort normal and breath sounds normal.  Abdominal: Soft. Bowel sounds are normal. She exhibits no distension. There is no tenderness. There is no rebound and no guarding.  Musculoskeletal: She exhibits no edema.  Skin: Skin is warm and dry.  Nursing note and vitals reviewed.   Lab Results: Basic Metabolic Panel:  Recent Labs Lab 02/11/15 0400 02/12/15 0403  NA 136 137  K 4.1 3.9  CL 104 107  CO2 22 21*  GLUCOSE 254* 68  BUN 20 14  CREATININE 1.46* 1.25*  CALCIUM 9.2 8.4*   Liver Function Tests:  Recent Labs Lab 02/11/15 0400 02/12/15 0403  AST 154* 139*  ALT 143* 117*  ALKPHOS 329* 341*  BILITOT 5.1* 4.1*  PROT 7.3 6.5  ALBUMIN 3.6 2.9*    Recent Labs Lab 02/11/15 0400 02/12/15 0403  LIPASE 97* 43   No results for input(s): AMMONIA in the last 168 hours. CBC:  Recent Labs Lab 02/11/15 0400 02/11/15 0605 02/12/15 0403  WBC 6.1  --  5.3  NEUTROABS  --  5.6  --   HGB 10.4*  --  9.4*  HCT 31.0*  --  28.5*    MCV 89.1  --  89.3  PLT 223  --  223   Cardiac Enzymes: No results for input(s): CKTOTAL, CKMB, CKMBINDEX, TROPONINI in the last 168 hours. BNP: No results for input(s): PROBNP in the last 168 hours. D-Dimer: No results for input(s): DDIMER in the last 168 hours. CBG: No results for input(s): GLUCAP in the last 168 hours. Hemoglobin A1C: No results for input(s): HGBA1C in the last 168 hours. Fasting Lipid Panel: No results for input(s): CHOL, HDL, LDLCALC, TRIG, CHOLHDL, LDLDIRECT in the last 168 hours. Thyroid Function Tests: No results for input(s): TSH, T4TOTAL, FREET4, T3FREE, THYROIDAB in the last 168 hours. Coagulation:  Recent Labs Lab 02/11/15 0605 02/12/15 0403  LABPROT 32.8* 41.6*  INR 3.29* 4.52*   Anemia Panel: No results for input(s): VITAMINB12, FOLATE, FERRITIN, TIBC, IRON, RETICCTPCT in the last 168 hours. Urine Drug Screen: Drugs of Abuse  No results found for: LABOPIA, COCAINSCRNUR, LABBENZ, AMPHETMU, THCU, LABBARB  Alcohol Level: No results for input(s): ETH in the last 168 hours. Urinalysis:  Recent Labs Lab 02/11/15 0515  COLORURINE ORANGE*  LABSPEC 1.024  PHURINE 5.5  GLUCOSEU 500*  HGBUR TRACE*  BILIRUBINUR LARGE*  KETONESUR NEGATIVE  PROTEINUR >300*  UROBILINOGEN 1.0  NITRITE NEGATIVE  LEUKOCYTESUR SMALL*   Misc. Labs:   Micro Results: No results found for this or any previous visit (from the past 240 hour(s)). Studies/Results: US Abdomen Complete  02/11/2015   CLINICAL DATA:  Right upper quadrant pain since 02/10/2015.  EXAM: ULTRASOUND ABDOMEN COMPLETE  COMPARISON:  Right upper quadrant ultrasound 10/24/2014.  FINDINGS: Gallbladder: There are some small, gravel type stones within the gallbladder but no gallbladder wall thickening or pericholecystic fluid. Sonographer reports negative Murphy's sign.  Common bile duct: Diameter: 0.5 cm. There is some increased echogenicity within the common bile duct worrisome for the presence of  sludge or possibly tiny stones.  Liver: No focal lesion identified. Echogenicity appears somewhat increased. No biliary ductal dilatation is seen.  IVC: No abnormality visualized.  Pancreas: Visualized portion unremarkable.  Spleen: Size and appearance within normal limits.  Right Kidney: Length: 10.2 cm. Echogenicity within normal limits. No mass or hydronephrosis visualized.  Left Kidney: Length: 9.9 cm. Echogenicity within normal limits. No hydronephrosis visualized. 1.1 cm in diameter cyst in the lower pole is incidentally noted.  Abdominal aorta: No aneurysm visualized.  Other findings: None.  IMPRESSION: Gallstones without evidence of cholecystitis.  Likely sludge or tiny stones within the common bile duct without intra or extrahepatic biliary ductal dilatation visualized.  Mildly increased echogenicity of the liver compatible with fatty infiltration.   Electronically Signed   By: Inge Rise M.D.   On: 02/11/2015 07:13   Mr Abdomen Mrcp Wo Cm  02/11/2015   CLINICAL DATA:  Right-sided abdominal pain. Nausea. Elevated liver function tests.  EXAM: MRI ABDOMEN WITHOUT CONTRAST  (INCLUDING MRCP)  TECHNIQUE: Multiplanar multisequence MR imaging of the abdomen was performed. Heavily T2-weighted images of the biliary and pancreatic ducts were obtained, and three-dimensional MRCP images were rendered by post processing.  COMPARISON:  Abdominal ultrasound of earlier today. Three hundred thirty-one CTA abdominal pelvic CT.  FINDINGS: Mild to moderate motion degradation on multiple pulse sequences.  Lower chest: Mild cardiomegaly, without pericardial or pleural effusion.  Hepatobiliary: Grossly normal noncontrast appearance the liver, without intrahepatic duct dilatation. Multiple gallstones. No intra or extrahepatic biliary ductal dilatation. The common duct measures 6 mm, including on image 44 of series 16. No evidence of choledocholithiasis or obstructive mass.  Pancreas: Suspicion of pancreas divisum, with a  prominent dorsal duct entering the duodenum on image 25 of series 12. No acute pancreatitis.  Spleen: Normal  Adrenals/Urinary Tract: Normal adrenal glands. Normal right kidney for age. A posterior interpolar left renal lesion measures 10 mm on image 27 of series 19 and demonstrates precontrast T1 hyperintensity. 9 mm on coronal image 70 of the prior CT.  Stomach/Bowel: Normal colon, appendix, and terminal ileum. Normal abdominal small bowel without ascites.  Vascular/Lymphatic: Normal aortic caliber.  No abdominal adenopathy.  Other: No ascites.  Ventral abdominal wall laxity contains fat.  Musculoskeletal: No acute osseous abnormality.  IMPRESSION: 1. Cholelithiasis without biliary duct dilatation or choledocholithiasis. 2. Otherwise, motion degraded exam. 3. Probable pancreas divisum, without acute pancreatitis. 4. A lower pole left renal lesion is indeterminate on this precontrast study. This could represent a hemorrhagic cyst or a solid neoplasm. Given patient age and renal insufficiency, a potential clinical strategy includes renal ultrasound followup at 1 year.   Electronically Signed   By: Abigail Miyamoto M.D.   On: 02/11/2015 18:58   Mr 3d Recon At Scanner  02/11/2015   CLINICAL DATA:  Right-sided abdominal pain. Nausea.  Elevated liver function tests.  EXAM: MRI ABDOMEN WITHOUT CONTRAST  (INCLUDING MRCP)  TECHNIQUE: Multiplanar multisequence MR imaging of the abdomen was performed. Heavily T2-weighted images of the biliary and pancreatic ducts were obtained, and three-dimensional MRCP images were rendered by post processing.  COMPARISON:  Abdominal ultrasound of earlier today. Three hundred thirty-one CTA abdominal pelvic CT.  FINDINGS: Mild to moderate motion degradation on multiple pulse sequences.  Lower chest: Mild cardiomegaly, without pericardial or pleural effusion.  Hepatobiliary: Grossly normal noncontrast appearance the liver, without intrahepatic duct dilatation. Multiple gallstones. No intra or  extrahepatic biliary ductal dilatation. The common duct measures 6 mm, including on image 44 of series 16. No evidence of choledocholithiasis or obstructive mass.  Pancreas: Suspicion of pancreas divisum, with a prominent dorsal duct entering the duodenum on image 25 of series 12. No acute pancreatitis.  Spleen: Normal  Adrenals/Urinary Tract: Normal adrenal glands. Normal right kidney for age. A posterior interpolar left renal lesion measures 10 mm on image 27 of series 19 and demonstrates precontrast T1 hyperintensity. 9 mm on coronal image 70 of the prior CT.  Stomach/Bowel: Normal colon, appendix, and terminal ileum. Normal abdominal small bowel without ascites.  Vascular/Lymphatic: Normal aortic caliber.  No abdominal adenopathy.  Other: No ascites.  Ventral abdominal wall laxity contains fat.  Musculoskeletal: No acute osseous abnormality.  IMPRESSION: 1. Cholelithiasis without biliary duct dilatation or choledocholithiasis. 2. Otherwise, motion degraded exam. 3. Probable pancreas divisum, without acute pancreatitis. 4. A lower pole left renal lesion is indeterminate on this precontrast study. This could represent a hemorrhagic cyst or a solid neoplasm. Given patient age and renal insufficiency, a potential clinical strategy includes renal ultrasound followup at 1 year.   Electronically Signed   By: Abigail Miyamoto M.D.   On: 02/11/2015 18:58   Medications: I have reviewed the patient's current medications. Scheduled Meds: . amLODipine  10 mg Oral Daily  . colchicine  0.6 mg Oral Daily  . latanoprost  1 drop Both Eyes Daily  . metoprolol succinate  12.5 mg Oral Daily  . rosuvastatin  5 mg Oral QHS   Continuous Infusions:  PRN Meds:.HYDROmorphone (DILAUDID) injection Assessment/Plan: Principal Problem:   Choledocholithiasis Active Problems:   Diabetes   Hyperlipidemia   Anemia of chronic disease   Essential hypertension   History of CVA (cerebrovascular accident)   Long-term (current) use of  anticoagulants   CKD (chronic kidney disease) stage 3, GFR 30-59 ml/min   Chronic gout due to renal impairment without tophus  Cholelithiasis History and labs c/w choledocholithiasis with elevated alk phos, bilirubin and biliary pain. U/S however shows sludge and tiny stones in common bile duct without ductal dilitation.  LFTs trending down today. MR 3D and MRCP suggested cholelithiasis without biliary duct dilation or choledocholithiasis and a lower lobe L renal cyst which could represent hemorrhagic cyst or solid neoplasm. It is suggesting renal US at 1 yr. Pts PCP Dr. Redmond Pulling to be informed. She has an appt on 8/3. -surgery and GI saw the patient, She decided not to have cholecystectomy done. As per their standpoint, patient can be sent home.  -pt doing well and tolerating po intake, likely d/c today. She is to go home on a low fat diet.    Diabetes Type 2, well controlled with CKD - Will hold home oral hypoglycemics and start SSI   Hyperlipidemia - Crestor   Anemia of chronic disease - Stable at baseline   Essential hypertension - Currently normotensive. - Continue home medications  History of CVA (cerebrovascular accident) - Per chart review, It appears she is treated with long term A/C after multiple CVA's on ASA and plavix and when a DVT was diagnosed in 1993 it was determined to keep her on life long A/C.   Long-term (current) use of anticoagulants - On life long A/C for DVT. INR currently supratheraputic at 4.52.    CKD (chronic kidney disease) stage 3, GFR 30-59 ml/min -SCr at 1.25 today. Stable.   Diet: NPO   Dispo: Disposition is deferred at this time, awaiting improvement of current medical problems.  Anticipated discharge in approximately 0 day(s).   The patient does have a current PCP Francesca Oman, DO) and does need an [facility] hospital follow-up appointment after discharge.  The patient does not have transportation limitations that hinder transportation  to clinic appointments.  .Services Needed at time of discharge: Y = Yes, Blank = No PT:   OT:   RN:   Equipment:   Other:     LOS: 1 day   Shela Leff, MD 02/12/2015, 4:04 PM

## 2015-02-12 NOTE — Discharge Instructions (Signed)
Cholelithiasis Cholelithiasis (also called gallstones) is a form of gallbladder disease in which gallstones form in your gallbladder. The gallbladder is an organ that stores bile made in the liver, which helps digest fats. Gallstones begin as small crystals and slowly grow into stones. Gallstone pain occurs when the gallbladder spasms and a gallstone is blocking the duct. Pain can also occur when a stone passes out of the duct.  RISK FACTORS  Being female.   Having multiple pregnancies. Health care providers sometimes advise removing diseased gallbladders before future pregnancies.   Being obese.  Eating a diet heavy in fried foods and fat.   Being older than 78 years and increasing age.   Prolonged use of medicines containing female hormones.   Having diabetes mellitus.   Rapidly losing weight.   Having a family history of gallstones (heredity).  SYMPTOMS  Nausea.   Vomiting.  Abdominal pain.   Yellowing of the skin (jaundice).   Sudden pain. It may persist from several minutes to several hours.  Fever.   Tenderness to the touch. In some cases, when gallstones do not move into the bile duct, people have no pain or symptoms. These are called "silent" gallstones.  TREATMENT Silent gallstones do not need treatment. In severe cases, emergency surgery may be required. Options for treatment include:  Surgery to remove the gallbladder. This is the most common treatment.  Medicines. These do not always work and may take 6-12 months or more to work.  Shock wave treatment (extracorporeal biliary lithotripsy). In this treatment an ultrasound machine sends shock waves to the gallbladder to break gallstones into smaller pieces that can pass into the intestines or be dissolved by medicine. HOME CARE INSTRUCTIONS   Only take over-the-counter or prescription medicines for pain, discomfort, or fever as directed by your health care provider.   Follow a low-fat diet until  seen again by your health care provider. Fat causes the gallbladder to contract, which can result in pain.   Follow up with your health care provider as directed. Attacks are almost always recurrent and surgery is usually required for permanent treatment.  SEEK IMMEDIATE MEDICAL CARE IF:   Your pain increases and is not controlled by medicines.   You have a fever or persistent symptoms for more than 2-3 days.   You have a fever and your symptoms suddenly get worse.   You have persistent nausea and vomiting.  MAKE SURE YOU:   Understand these instructions.  Will watch your condition.  Will get help right away if you are not doing well or get worse. Document Released: 07/08/2005 Document Revised: 03/14/2013 Document Reviewed: 01/03/2013 [facility] Patient Information 2015 [place], [place]. This information is not intended to replace advice given to you by your health care provider. Make sure you discuss any questions you have with your health care provider.   FOLLOW UP  Dr. Redmond Pulling on 02/26/2015 at 2:15pm  [place] [id number] [phone]

## 2015-02-12 NOTE — Progress Notes (Signed)
Patient ID: Chelsea Mcdaniel, female   DOB: 1928-05-31, 81 y.o.   MRN: [id number]    Subjective: Pt feels well today.  No pain.  Tolerating clear liquids  Objective: Vital signs in last 24 hours: Temp:  [97.8 F (36.6 C)-98.7 F (37.1 C)] 98.3 F (36.8 C) (07/20 0535) Pulse Rate:  [63-76] 72 (07/20 0535) Resp:  [18-29] 18 (07/20 0535) BP: (154-198)/(53-94) 167/61 mmHg (07/20 0535) SpO2:  [91 %-99 %] 96 % (07/20 0535) Weight:  [70.625 kg (155 lb 11.2 oz)] 70.625 kg (155 lb 11.2 oz) (07/20 0800) Last BM Date:  (pt states she is "not sure")  Intake/Output from previous day: 07/19 0701 - 07/20 0700 In: 442 [I.V.:442] Out: -  Intake/Output this shift: Total I/O In: 240 [P.O.:240] Out: -   PE: Abd: soft, NT, ND, +BS  Lab Results:   Recent Labs  02/11/15 0400 02/12/15 0403  WBC 6.1 5.3  HGB 10.4* 9.4*  HCT 31.0* 28.5*  PLT 223 223   BMET  Recent Labs  02/11/15 0400 02/12/15 0403  NA 136 137  K 4.1 3.9  CL 104 107  CO2 22 21*  GLUCOSE 254* 68  BUN 20 14  CREATININE 1.46* 1.25*  CALCIUM 9.2 8.4*   PT/INR  Recent Labs  02/11/15 0605 02/12/15 0403  LABPROT 32.8* 41.6*  INR 3.29* 4.52*   CMP     Component Value Date/Time   NA 137 02/12/2015 0403   K 3.9 02/12/2015 0403   CL 107 02/12/2015 0403   CO2 21* 02/12/2015 0403   GLUCOSE 68 02/12/2015 0403   BUN 14 02/12/2015 0403   CREATININE 1.25* 02/12/2015 0403   CREATININE 1.91* 02/03/2015 1658   CALCIUM 8.4* 02/12/2015 0403   CALCIUM 8.8 10/14/2014 1553   PROT 6.5 02/12/2015 0403   ALBUMIN 2.9* 02/12/2015 0403   AST 139* 02/12/2015 0403   ALT 117* 02/12/2015 0403   ALKPHOS 341* 02/12/2015 0403   BILITOT 4.1* 02/12/2015 0403   GFRNONAA 38* 02/12/2015 0403   GFRNONAA 24* 02/03/2015 1658   GFRAA 44* 02/12/2015 0403   GFRAA 27* 02/03/2015 1658   Lipase     Component Value Date/Time   LIPASE 43 02/12/2015 0403       Studies/Results: US Abdomen Complete  02/11/2015   CLINICAL DATA:  Right  upper quadrant pain since 02/10/2015.  EXAM: ULTRASOUND ABDOMEN COMPLETE  COMPARISON:  Right upper quadrant ultrasound 10/24/2014.  FINDINGS: Gallbladder: There are some small, gravel type stones within the gallbladder but no gallbladder wall thickening or pericholecystic fluid. Sonographer reports negative Murphy's sign.  Common bile duct: Diameter: 0.5 cm. There is some increased echogenicity within the common bile duct worrisome for the presence of sludge or possibly tiny stones.  Liver: No focal lesion identified. Echogenicity appears somewhat increased. No biliary ductal dilatation is seen.  IVC: No abnormality visualized.  Pancreas: Visualized portion unremarkable.  Spleen: Size and appearance within normal limits.  Right Kidney: Length: 10.2 cm. Echogenicity within normal limits. No mass or hydronephrosis visualized.  Left Kidney: Length: 9.9 cm. Echogenicity within normal limits. No hydronephrosis visualized. 1.1 cm in diameter cyst in the lower pole is incidentally noted.  Abdominal aorta: No aneurysm visualized.  Other findings: None.  IMPRESSION: Gallstones without evidence of cholecystitis.  Likely sludge or tiny stones within the common bile duct without intra or extrahepatic biliary ductal dilatation visualized.  Mildly increased echogenicity of the liver compatible with fatty infiltration.   Electronically Signed   By: Inge Rise M.D.  On: 02/11/2015 07:13   Mr Abdomen Mrcp Wo Cm  02/11/2015   CLINICAL DATA:  Right-sided abdominal pain. Nausea. Elevated liver function tests.  EXAM: MRI ABDOMEN WITHOUT CONTRAST  (INCLUDING MRCP)  TECHNIQUE: Multiplanar multisequence MR imaging of the abdomen was performed. Heavily T2-weighted images of the biliary and pancreatic ducts were obtained, and three-dimensional MRCP images were rendered by post processing.  COMPARISON:  Abdominal ultrasound of earlier today. Three hundred thirty-one CTA abdominal pelvic CT.  FINDINGS: Mild to moderate motion  degradation on multiple pulse sequences.  Lower chest: Mild cardiomegaly, without pericardial or pleural effusion.  Hepatobiliary: Grossly normal noncontrast appearance the liver, without intrahepatic duct dilatation. Multiple gallstones. No intra or extrahepatic biliary ductal dilatation. The common duct measures 6 mm, including on image 44 of series 16. No evidence of choledocholithiasis or obstructive mass.  Pancreas: Suspicion of pancreas divisum, with a prominent dorsal duct entering the duodenum on image 25 of series 12. No acute pancreatitis.  Spleen: Normal  Adrenals/Urinary Tract: Normal adrenal glands. Normal right kidney for age. A posterior interpolar left renal lesion measures 10 mm on image 27 of series 19 and demonstrates precontrast T1 hyperintensity. 9 mm on coronal image 70 of the prior CT.  Stomach/Bowel: Normal colon, appendix, and terminal ileum. Normal abdominal small bowel without ascites.  Vascular/Lymphatic: Normal aortic caliber.  No abdominal adenopathy.  Other: No ascites.  Ventral abdominal wall laxity contains fat.  Musculoskeletal: No acute osseous abnormality.  IMPRESSION: 1. Cholelithiasis without biliary duct dilatation or choledocholithiasis. 2. Otherwise, motion degraded exam. 3. Probable pancreas divisum, without acute pancreatitis. 4. A lower pole left renal lesion is indeterminate on this precontrast study. This could represent a hemorrhagic cyst or a solid neoplasm. Given patient age and renal insufficiency, a potential clinical strategy includes renal ultrasound followup at 1 year.   Electronically Signed   By: Abigail Miyamoto M.D.   On: 02/11/2015 18:58   Mr 3d Recon At Scanner  02/11/2015   CLINICAL DATA:  Right-sided abdominal pain. Nausea. Elevated liver function tests.  EXAM: MRI ABDOMEN WITHOUT CONTRAST  (INCLUDING MRCP)  TECHNIQUE: Multiplanar multisequence MR imaging of the abdomen was performed. Heavily T2-weighted images of the biliary and pancreatic ducts were  obtained, and three-dimensional MRCP images were rendered by post processing.  COMPARISON:  Abdominal ultrasound of earlier today. Three hundred thirty-one CTA abdominal pelvic CT.  FINDINGS: Mild to moderate motion degradation on multiple pulse sequences.  Lower chest: Mild cardiomegaly, without pericardial or pleural effusion.  Hepatobiliary: Grossly normal noncontrast appearance the liver, without intrahepatic duct dilatation. Multiple gallstones. No intra or extrahepatic biliary ductal dilatation. The common duct measures 6 mm, including on image 44 of series 16. No evidence of choledocholithiasis or obstructive mass.  Pancreas: Suspicion of pancreas divisum, with a prominent dorsal duct entering the duodenum on image 25 of series 12. No acute pancreatitis.  Spleen: Normal  Adrenals/Urinary Tract: Normal adrenal glands. Normal right kidney for age. A posterior interpolar left renal lesion measures 10 mm on image 27 of series 19 and demonstrates precontrast T1 hyperintensity. 9 mm on coronal image 70 of the prior CT.  Stomach/Bowel: Normal colon, appendix, and terminal ileum. Normal abdominal small bowel without ascites.  Vascular/Lymphatic: Normal aortic caliber.  No abdominal adenopathy.  Other: No ascites.  Ventral abdominal wall laxity contains fat.  Musculoskeletal: No acute osseous abnormality.  IMPRESSION: 1. Cholelithiasis without biliary duct dilatation or choledocholithiasis. 2. Otherwise, motion degraded exam. 3. Probable pancreas divisum, without acute pancreatitis. 4. A lower  pole left renal lesion is indeterminate on this precontrast study. This could represent a hemorrhagic cyst or a solid neoplasm. Given patient age and renal insufficiency, a potential clinical strategy includes renal ultrasound followup at 1 year.   Electronically Signed   By: Abigail Miyamoto M.D.   On: 02/11/2015 18:58    Anti-infectives: Anti-infectives    Start     Dose/Rate Route Frequency Ordered Stop   02/11/15 1400   cefTRIAXone (ROCEPHIN) 2 g in dextrose 5 % 50 mL IVPB - Premix     2 g 100 mL/hr over 30 Minutes Intravenous Every 24 hours 02/11/15 1316         Assessment/Plan  1. Gallstones, elevated LFTs -MRCP was negative for CBD obstruction -patient is pain free -she does not want an operation at her age.  She may continue to have similar episodes to this in the future.  At this point, her diet may be advanced as tolerated from our standpoint and further care per primary service, unless the patient changes her mind. -recommend low fat diet to help minimize episodes of biliary colic.   LOS: 1 day    Candiace West E 02/12/2015, 9:12 AM Pager: [phone]

## 2015-02-12 NOTE — Progress Notes (Signed)
Discharge instructions gone over with patient and family. Home medications reviewed. Follow up appointment is made. Low fat diet instructions and information given. Diet, activity, and reasons to call the doctor gone over. Patient verbalized understanding of instructions.

## 2015-02-12 NOTE — Discharge Summary (Signed)
Name: Chelsea Mcdaniel MRN: [id number] DOB: [birth year] 81 y.o. PCP: Francesca Oman, DO  Date of Admission: 02/11/2015  3:51 AM Date of Discharge: 02/16/2015 Attending Physician: No att. providers found  Discharge Diagnosis:  Principal Problem:   Choledocholithiasis Active Problems:   Diabetes   Hyperlipidemia   Anemia of chronic disease   Essential hypertension   History of CVA (cerebrovascular accident)   Long-term (current) use of anticoagulants   CKD (chronic kidney disease) stage 3, GFR 30-59 ml/min   Chronic gout due to renal impairment without tophus  Discharge Medications:   Medication List    STOP taking these medications        benazepril 20 MG tablet  Commonly known as:  LOTENSIN      TAKE these medications        ACCU-CHEK AVIVA device  Use as instructed     ACCU-CHEK AVIVA PLUS test strip  Generic drug:  glucose blood  Use 1 time daily to check blood sugar.     ACCU-CHEK SOFTCLIX LANCETS lancets  Use 1 time daily to check blood sugar.     acetaminophen 325 MG tablet  Commonly known as:  TYLENOL  Take 2 tablets (650 mg total) by mouth every 6 (six) hours as needed.     amLODipine 10 MG tablet  Commonly known as:  NORVASC  Take 1 tablet (10 mg total) by mouth daily. For blood pressure     Calcium Carbonate-Vitamin D 600-400 MG-UNIT per tablet  Take 1 tablet by mouth 2 (two) times daily.     colchicine 0.6 MG tablet  Take 1 tablet (0.6 mg total) by mouth daily.     CRESTOR 5 MG tablet  Generic drug:  rosuvastatin  take 1 tablet by mouth at bedtime     GLIPIZIDE XL 10 MG 24 hr tablet  Generic drug:  glipiZIDE  TAKE ONE TABLET BY MOUTH EVERY DAY     HYDROcodone-acetaminophen 5-325 MG per tablet  Commonly known as:  NORCO/VICODIN  Take 1 tablet by mouth every 6 (six) hours as needed for moderate pain.     LUMIGAN 0.01 % Soln  Generic drug:  bimatoprost  Place 1 drop into both eyes every morning.     metoprolol succinate 25 MG 24 hr tablet    Commonly known as:  TOPROL-XL  TAKE ONE-HALF TABLET BY MOUTH EVERY DAY     senna-docusate 8.6-50 MG per tablet  Commonly known as:  Senokot-S  Take 1 tablet by mouth at bedtime as needed for mild constipation.     simethicone 80 MG chewable tablet  Commonly known as:  GAS-X  Chew 1 tablet (80 mg total) by mouth every 6 (six) hours as needed for flatulence.     sitaGLIPtin 25 MG tablet  Commonly known as:  JANUVIA  Take 1 tablet (25 mg total) by mouth daily.     warfarin 5 MG tablet  Commonly known as:  COUMADIN  Take 1 tablet by mouth daily, except 1.5 mg on Mondays, Wednesdays, and Fridays        Disposition and follow-up:   Chelsea Mcdaniel was discharged from [facility] in Stable condition.  At the hospital follow up visit please address:  1.  Cholelithiasis Any episodes of abdominal pain, nausea, or vomiting? Any fever or chills?   Of note: MR 3D and MRCP showed lower lobe L renal cyst which could represent hemorrhagic cyst or solid neoplasm. It is suggesting renal US at  1 yr.   2.  Labs / imaging needed at time of follow-up: Renal ultrasound in 32yr  3.  Pending labs/ test needing follow-up: None  Follow-up Appointments: Follow-up Information    Follow up with Duwaine Maxin, DO. Go on 02/26/2015.   Specialty:  Internal Medicine   Why:  Appointment at 2:15 pm   Contact information:   [place] [id number] [phone]       Discharge Instructions: Discharge Instructions    Discharge diet:    Complete by:  As directed   LOW FAT     Discharge diet:    Complete by:  As directed   LOW FAT DIET     Increase activity slowly    Complete by:  As directed      Increase activity slowly    Complete by:  As directed            Consultations: Treatment Team:  Md Edison Pace, MD   Surgery : Saverio Danker PA-C   GI: Dr. Lowella Bandy. [place]   Procedures Performed:  US Abdomen Complete  02/11/2015   CLINICAL DATA:  Right upper quadrant pain  since 02/10/2015.  EXAM: ULTRASOUND ABDOMEN COMPLETE  COMPARISON:  Right upper quadrant ultrasound 10/24/2014.  FINDINGS: Gallbladder: There are some small, gravel type stones within the gallbladder but no gallbladder wall thickening or pericholecystic fluid. Sonographer reports negative Murphy's sign.  Common bile duct: Diameter: 0.5 cm. There is some increased echogenicity within the common bile duct worrisome for the presence of sludge or possibly tiny stones.  Liver: No focal lesion identified. Echogenicity appears somewhat increased. No biliary ductal dilatation is seen.  IVC: No abnormality visualized.  Pancreas: Visualized portion unremarkable.  Spleen: Size and appearance within normal limits.  Right Kidney: Length: 10.2 cm. Echogenicity within normal limits. No mass or hydronephrosis visualized.  Left Kidney: Length: 9.9 cm. Echogenicity within normal limits. No hydronephrosis visualized. 1.1 cm in diameter cyst in the lower pole is incidentally noted.  Abdominal aorta: No aneurysm visualized.  Other findings: None.  IMPRESSION: Gallstones without evidence of cholecystitis.  Likely sludge or tiny stones within the common bile duct without intra or extrahepatic biliary ductal dilatation visualized.  Mildly increased echogenicity of the liver compatible with fatty infiltration.   Electronically Signed   By: Inge Rise M.D.   On: 02/11/2015 07:13   Mr Abdomen Mrcp Wo Cm  02/11/2015   CLINICAL DATA:  Right-sided abdominal pain. Nausea. Elevated liver function tests.  EXAM: MRI ABDOMEN WITHOUT CONTRAST  (INCLUDING MRCP)  TECHNIQUE: Multiplanar multisequence MR imaging of the abdomen was performed. Heavily T2-weighted images of the biliary and pancreatic ducts were obtained, and three-dimensional MRCP images were rendered by post processing.  COMPARISON:  Abdominal ultrasound of earlier today. Three hundred thirty-one CTA abdominal pelvic CT.  FINDINGS: Mild to moderate motion degradation on multiple  pulse sequences.  Lower chest: Mild cardiomegaly, without pericardial or pleural effusion.  Hepatobiliary: Grossly normal noncontrast appearance the liver, without intrahepatic duct dilatation. Multiple gallstones. No intra or extrahepatic biliary ductal dilatation. The common duct measures 6 mm, including on image 44 of series 16. No evidence of choledocholithiasis or obstructive mass.  Pancreas: Suspicion of pancreas divisum, with a prominent dorsal duct entering the duodenum on image 25 of series 12. No acute pancreatitis.  Spleen: Normal  Adrenals/Urinary Tract: Normal adrenal glands. Normal right kidney for age. A posterior interpolar left renal lesion measures 10 mm on image 27 of series 19 and demonstrates  precontrast T1 hyperintensity. 9 mm on coronal image 70 of the prior CT.  Stomach/Bowel: Normal colon, appendix, and terminal ileum. Normal abdominal small bowel without ascites.  Vascular/Lymphatic: Normal aortic caliber.  No abdominal adenopathy.  Other: No ascites.  Ventral abdominal wall laxity contains fat.  Musculoskeletal: No acute osseous abnormality.  IMPRESSION: 1. Cholelithiasis without biliary duct dilatation or choledocholithiasis. 2. Otherwise, motion degraded exam. 3. Probable pancreas divisum, without acute pancreatitis. 4. A lower pole left renal lesion is indeterminate on this precontrast study. This could represent a hemorrhagic cyst or a solid neoplasm. Given patient age and renal insufficiency, a potential clinical strategy includes renal ultrasound followup at 1 year.   Electronically Signed   By: Abigail Miyamoto M.D.   On: 02/11/2015 18:58   Mr 3d Recon At Scanner  02/11/2015   CLINICAL DATA:  Right-sided abdominal pain. Nausea. Elevated liver function tests.  EXAM: MRI ABDOMEN WITHOUT CONTRAST  (INCLUDING MRCP)  TECHNIQUE: Multiplanar multisequence MR imaging of the abdomen was performed. Heavily T2-weighted images of the biliary and pancreatic ducts were obtained, and  three-dimensional MRCP images were rendered by post processing.  COMPARISON:  Abdominal ultrasound of earlier today. Three hundred thirty-one CTA abdominal pelvic CT.  FINDINGS: Mild to moderate motion degradation on multiple pulse sequences.  Lower chest: Mild cardiomegaly, without pericardial or pleural effusion.  Hepatobiliary: Grossly normal noncontrast appearance the liver, without intrahepatic duct dilatation. Multiple gallstones. No intra or extrahepatic biliary ductal dilatation. The common duct measures 6 mm, including on image 44 of series 16. No evidence of choledocholithiasis or obstructive mass.  Pancreas: Suspicion of pancreas divisum, with a prominent dorsal duct entering the duodenum on image 25 of series 12. No acute pancreatitis.  Spleen: Normal  Adrenals/Urinary Tract: Normal adrenal glands. Normal right kidney for age. A posterior interpolar left renal lesion measures 10 mm on image 27 of series 19 and demonstrates precontrast T1 hyperintensity. 9 mm on coronal image 70 of the prior CT.  Stomach/Bowel: Normal colon, appendix, and terminal ileum. Normal abdominal small bowel without ascites.  Vascular/Lymphatic: Normal aortic caliber.  No abdominal adenopathy.  Other: No ascites.  Ventral abdominal wall laxity contains fat.  Musculoskeletal: No acute osseous abnormality.  IMPRESSION: 1. Cholelithiasis without biliary duct dilatation or choledocholithiasis. 2. Otherwise, motion degraded exam. 3. Probable pancreas divisum, without acute pancreatitis. 4. A lower pole left renal lesion is indeterminate on this precontrast study. This could represent a hemorrhagic cyst or a solid neoplasm. Given patient age and renal insufficiency, a potential clinical strategy includes renal ultrasound followup at 1 year.   Electronically Signed   By: Abigail Miyamoto M.D.   On: 02/11/2015 18:58    Admission HPI: Chelsea Mcdaniel is a 81 yo female with PMH of DM2, HTN, HLD, DVT, and CKD3 who presents to the ED with CC  of abdominal pain. She reports that yesterday she started having 10/10 sharp RUQ abdominal pain that radiated to the back. She endorses nausea without any vomiting. She denied any other associated symptoms and denies any fever, chills, diarrhea or constipation. Of note she did have a similar presentation in march where the was diagnosed with cholelithiasis without cholecystitis. She has not had a cholecystectomy. She has been compliant with her home medications including warfarin for her Hx of DVT.  Hospital Course by problem list: Principal Problem:   Choledocholithiasis Active Problems:   Diabetes   Hyperlipidemia   Anemia of chronic disease   Essential hypertension   History of CVA (cerebrovascular  accident)   Long-term (current) use of anticoagulants   CKD (chronic kidney disease) stage 3, GFR 30-59 ml/min   Chronic gout due to renal impairment without tophus   Cholelithiasis Patient presented with right upper quadrant abdominal pain radiating to the back. Pain was accompanied by nausea. No leukocytosis. However, history and labs c/w choledocholithiasis with elevated alk phos, bilirubin and biliary pain. Patient's pain was managed with Dilaudid and nausea managed with Zofran. On the day of admission she was also given Ceftriaxone 2g IV. U/S results however showed sludge and tiny stones in the common bile duct without ductal dilitation.MR 3D and MRCP suggested cholelithiasis without biliary duct dilation or choledocholithiasis and a lower lobe L renal cyst which could represent hemorrhagic cyst or solid neoplasm. It suggested renal US at 1 yr. Pt to follow up with PCP Dr. Redmond Pulling on 8/3. Surgery and Gastroenterology were consulted and they saw the patient. Patient decided not to have cholecystectomy done. As per their standpoint, patient could be discharged home because on 7/20 she was asymptomatic, no abdominal pain or nausea. LFTs trended down since admission. As per GI recommendation, pt  went home a low fat diet.    Diabetes Type 2, well controlled with CKD - Held home oral hypoglycemics and started SSI   Hyperlipidemia - Crestor   Anemia of chronic disease - Stable at baseline   Essential hypertension - Currently normotensive. - Continue home medications   History of CVA (cerebrovascular accident) - Per chart review, It appears she is treated with long term A/C after multiple CVA's on ASA and plavix and when a DVT was diagnosed in 1993 it was determined to keep her on life long A/C.   Long-term (current) use of anticoagulants - On life long A/C for DVT.We held home med Coumadin in the setting of supratherapeutic INR at admission (3.29).    CKD (chronic kidney disease) stage 3, GFR 30-59 ml/min -Stable. SCr was 1.46 on admission, consistent with previous values. On 7/20 SCr improved (1.25).   Discharge Vitals:   BP 173/57 mmHg  Pulse 71  Temp(Src) 97.9 F (36.6 C) (Oral)  Resp 18  Ht $R'5\' 2"'Pb$  (1.575 m)  Wt 155 lb 11.2 oz (70.625 kg)  BMI 28.47 kg/m2  SpO2 98%  Discharge Labs:  No results found for this or any previous visit (from the past 24 hour(s)).  Signed: Shela Leff, MD 02/16/2015, 6:34 PM    Services Ordered on Discharge: None Equipment Ordered on Discharge: None

## 2015-02-13 DIAGNOSIS — M109 Gout, unspecified: Secondary | ICD-10-CM | POA: Diagnosis not present

## 2015-02-14 ENCOUNTER — Ambulatory Visit: Payer: Medicare Other | Admitting: Pharmacist

## 2015-02-14 DIAGNOSIS — M109 Gout, unspecified: Secondary | ICD-10-CM | POA: Diagnosis not present

## 2015-02-15 DIAGNOSIS — M109 Gout, unspecified: Secondary | ICD-10-CM | POA: Diagnosis not present

## 2015-02-17 ENCOUNTER — Ambulatory Visit (INDEPENDENT_AMBULATORY_CARE_PROVIDER_SITE_OTHER): Payer: Medicare Other | Admitting: Pharmacist

## 2015-02-17 DIAGNOSIS — Z7901 Long term (current) use of anticoagulants: Secondary | ICD-10-CM | POA: Diagnosis not present

## 2015-02-17 DIAGNOSIS — I639 Cerebral infarction, unspecified: Secondary | ICD-10-CM

## 2015-02-17 DIAGNOSIS — M109 Gout, unspecified: Secondary | ICD-10-CM | POA: Diagnosis not present

## 2015-02-17 DIAGNOSIS — Z8673 Personal history of transient ischemic attack (TIA), and cerebral infarction without residual deficits: Secondary | ICD-10-CM | POA: Diagnosis not present

## 2015-02-17 LAB — POCT INR: INR: 2.9

## 2015-02-17 NOTE — Patient Instructions (Signed)
Patient instructed to take medications as defined in the Anti-coagulation Track section of this encounter.  Patient instructed to take today's dose.  Patient verbalized understanding of these instructions.    

## 2015-02-17 NOTE — Progress Notes (Signed)
Anti-Coagulation Progress Note  Chelsea Mcdaniel is a 81 y.o. female who is currently on an anti-coagulation regimen.    RECENT RESULTS: Recent results are below, the most recent result is correlated with a dose of 42.5 mg. per week: Lab Results  Component Value Date   INR 2.90 02/17/2015   INR 4.52* 02/12/2015   INR 3.29* 02/11/2015    ANTI-COAG DOSE: Anticoagulation Dose Instructions as of 02/17/2015      [name] Tue Wed Thu Fri Sat   New Dose 5 mg 5 mg 5 mg 7.5 mg 5 mg 7.5 mg 5 mg       ANTICOAG SUMMARY: Anticoagulation Episode Summary    Current INR goal    Next INR check 03/10/2015   INR from last check 2.90 (02/17/2015)   Weekly max dose    Target end date    INR check location    Preferred lab    Send INR reminders to       Comments         ANTICOAG TODAY: Anticoagulation Summary as of 02/17/2015    INR goal    Selected INR 2.90 (02/17/2015)   Next INR check 03/10/2015   Target end date     Anticoagulation Episode Summary    INR check location    Preferred lab    Send INR reminders to    Comments       PATIENT INSTRUCTIONS: Patient Instructions  Patient instructed to take medications as defined in the Anti-coagulation Track section of this encounter.  Patient instructed to take today's dose.  Patient verbalized understanding of these instructions.       FOLLOW-UP Return in 3 weeks (on 03/10/2015) for Follow up INR at 4:15PM.  [name], III Pharm.D., CACP

## 2015-02-18 DIAGNOSIS — M109 Gout, unspecified: Secondary | ICD-10-CM | POA: Diagnosis not present

## 2015-02-19 DIAGNOSIS — M109 Gout, unspecified: Secondary | ICD-10-CM | POA: Diagnosis not present

## 2015-02-20 ENCOUNTER — Telehealth: Payer: Self-pay | Admitting: *Deleted

## 2015-02-20 DIAGNOSIS — M109 Gout, unspecified: Secondary | ICD-10-CM | POA: Diagnosis not present

## 2015-02-20 NOTE — Telephone Encounter (Signed)
Talked with [facility] CNA for pt - called about BP since hospital discharge. 02/17/15 171/75, 02/18/15 168/79, 02/19/15 190/76 and today 180/71. BP is checked about 9:30AM and pt takes her meds after lunch. Dr Redmond Pulling aware - has appt 02/26/15 with Dr Redmond Pulling and a BP med was stopped at discharge. Reassured and to call clinic if any change. Hilda Blades Eleaner Dibartolo RN 02/20/15 10:30AM

## 2015-02-21 DIAGNOSIS — M109 Gout, unspecified: Secondary | ICD-10-CM | POA: Diagnosis not present

## 2015-02-22 DIAGNOSIS — M109 Gout, unspecified: Secondary | ICD-10-CM | POA: Diagnosis not present

## 2015-02-24 DIAGNOSIS — M109 Gout, unspecified: Secondary | ICD-10-CM | POA: Diagnosis not present

## 2015-02-25 DIAGNOSIS — M109 Gout, unspecified: Secondary | ICD-10-CM | POA: Diagnosis not present

## 2015-02-26 ENCOUNTER — Encounter: Payer: Self-pay | Admitting: Internal Medicine

## 2015-02-26 ENCOUNTER — Ambulatory Visit (INDEPENDENT_AMBULATORY_CARE_PROVIDER_SITE_OTHER): Payer: Medicare Other | Admitting: Internal Medicine

## 2015-02-26 VITALS — BP 128/73 | HR 67 | Temp 97.7°F | Ht 62.0 in | Wt 155.1 lb

## 2015-02-26 DIAGNOSIS — I10 Essential (primary) hypertension: Secondary | ICD-10-CM

## 2015-02-26 DIAGNOSIS — N289 Disorder of kidney and ureter, unspecified: Secondary | ICD-10-CM | POA: Diagnosis not present

## 2015-02-26 DIAGNOSIS — E118 Type 2 diabetes mellitus with unspecified complications: Secondary | ICD-10-CM

## 2015-02-26 DIAGNOSIS — Z8719 Personal history of other diseases of the digestive system: Secondary | ICD-10-CM | POA: Diagnosis not present

## 2015-02-26 DIAGNOSIS — M109 Gout, unspecified: Secondary | ICD-10-CM | POA: Diagnosis not present

## 2015-02-26 DIAGNOSIS — K8050 Calculus of bile duct without cholangitis or cholecystitis without obstruction: Secondary | ICD-10-CM

## 2015-02-26 LAB — GLUCOSE, CAPILLARY: GLUCOSE-CAPILLARY: 258 mg/dL — AB (ref 65–99)

## 2015-02-26 NOTE — Assessment & Plan Note (Addendum)
Symptoms resolved.  Following low-fat diet.  Abd exam benign. She says she is not interested at surgery at her age.

## 2015-02-26 NOTE — Patient Instructions (Signed)
1. Please come back to see me in 2 months.   2. Please take all medications as prescribed.    3. If you have worsening of your symptoms or new symptoms arise, please call the clinic [phone]), or go to the ER immediately if symptoms are severe.

## 2015-02-26 NOTE — Progress Notes (Signed)
Subjective:    Patient ID: Chelsea Mcdaniel, female    DOB: [birth year], 81 y.o.   MRN: [id number]  HPI Comments: Chelsea Mcdaniel is an 81 year old woman with PMH as below here for hospital follow-up.  Please see problem based charting for assessment and plan.     Review of Systems  Constitutional: Negative for fever, chills and appetite change.  Respiratory: Negative for shortness of breath.   Cardiovascular: Negative for chest pain.  Gastrointestinal: Negative for nausea, vomiting, abdominal pain, diarrhea, constipation, blood in stool and abdominal distention.  Genitourinary: Negative for difficulty urinating.  Neurological: Negative for dizziness and light-headedness.       Past Medical History  Diagnosis Date  . Diabetes mellitus   . Hyperlipidemia   . Hypertension   . CVA (cerebrovascular accident)  October 2007     CT of the head atrophy with multiple remote insults noted but no definite acute findings  . Blindness of left eye      likely related to stroke, left cataract removed from that eye with complications  . Anemia      normocytic anemia with baseline hemoglobin 10-11  . Chronic kidney disease 2006     left renal artery stenosis with probable hemodynamic significance, kidneys are normal in morphology without focal lesions or hydronephrosis this is based but cannot A. of the abdomen with and without contrast done on the 31st 2006  . Glaucoma   . Personal history of DVT (deep vein thrombosis) 08/11/2010  . Calculus gallbladder and bile duct with cholecystitis with obstruction 01/2015   Current Outpatient Prescriptions on File Prior to Visit  Medication Sig Dispense Refill  . ACCU-CHEK AVIVA PLUS test strip Use 1 time daily to check blood sugar. 100 each 11  . ACCU-CHEK SOFTCLIX LANCETS lancets Use 1 time daily to check blood sugar. 100 each 1  . acetaminophen (TYLENOL) 325 MG tablet Take 2 tablets (650 mg total) by mouth every 6 (six) hours as needed.    Marland Kitchen amLODipine  (NORVASC) 10 MG tablet Take 1 tablet (10 mg total) by mouth daily. For blood pressure 90 tablet 3  . Calcium Carbonate-Vitamin D 600-400 MG-UNIT per tablet Take 1 tablet by mouth 2 (two) times daily.    . colchicine 0.6 MG tablet Take 1 tablet (0.6 mg total) by mouth daily. 14 tablet 0  . CRESTOR 5 MG tablet take 1 tablet by mouth at bedtime 90 tablet 1  . GLIPIZIDE XL 10 MG 24 hr tablet TAKE ONE TABLET BY MOUTH EVERY DAY 90 tablet 0  . HYDROcodone-acetaminophen (NORCO/VICODIN) 5-325 MG per tablet Take 1 tablet by mouth every 6 (six) hours as needed for moderate pain. 20 tablet 0  . LUMIGAN 0.01 % SOLN Place 1 drop into both eyes every morning.     . metoprolol succinate (TOPROL-XL) 25 MG 24 hr tablet TAKE ONE-HALF TABLET BY MOUTH EVERY DAY 45 tablet 3  . senna-docusate (SENOKOT-S) 8.6-50 MG per tablet Take 1 tablet by mouth at bedtime as needed for mild constipation. 30 tablet 2  . simethicone (GAS-X) 80 MG chewable tablet Chew 1 tablet (80 mg total) by mouth every 6 (six) hours as needed for flatulence. 30 tablet 0  . sitaGLIPtin (JANUVIA) 25 MG tablet Take 1 tablet (25 mg total) by mouth daily. 30 tablet 2  . warfarin (COUMADIN) 5 MG tablet Take 1 tablet by mouth daily, except 1.5 mg on Mondays, Wednesdays, and Fridays (Patient taking differently: Take 5 mg by mouth  daily. ) 36 tablet 3   No current facility-administered medications on file prior to visit.   Filed Vitals:   02/26/15 1448  BP: 128/73  Pulse: 67  Temp: 97.7 F (36.5 C)  TempSrc: Oral  Height: 5\' 2"  (1.575 m)  Weight: 155 lb 1.6 oz (70.353 kg)  SpO2: 96%   Objective:   Physical Exam  Constitutional: She is oriented to person, place, and time. She appears well-developed. No distress.  HENT:  Head: Normocephalic and atraumatic.  Mouth/Throat: Oropharynx is clear and moist. No oropharyngeal exudate.  Eyes: EOM are normal. Pupils are equal, round, and reactive to light.  Neck: Neck supple.  Cardiovascular: Normal rate,  regular rhythm and normal heart sounds.  Exam reveals no gallop and no friction rub.   No murmur heard. Pulmonary/Chest: Effort normal and breath sounds normal. No respiratory distress. She has no wheezes. She has no rales.  Abdominal: Soft. Bowel sounds are normal. She exhibits no distension. There is no tenderness. There is no rebound and no guarding.  Musculoskeletal: Normal range of motion. She exhibits no edema or tenderness.  Neurological: She is alert and oriented to person, place, and time. No cranial nerve deficit.  Skin: Skin is warm. She is not diaphoretic.  Psychiatric: She has a normal mood and affect. Her behavior is normal. Judgment and thought content normal.  Vitals reviewed.         Assessment & Plan:  Please see problem based charting for assessment and plan.

## 2015-02-27 DIAGNOSIS — N289 Disorder of kidney and ureter, unspecified: Secondary | ICD-10-CM | POA: Insufficient documentation

## 2015-02-27 DIAGNOSIS — M109 Gout, unspecified: Secondary | ICD-10-CM | POA: Diagnosis not present

## 2015-02-27 NOTE — Assessment & Plan Note (Signed)
I discussed this finding with the patient and her daughter-in-law.  Both agree that we will monitor this with follow-up US in 1 year.

## 2015-02-27 NOTE — Assessment & Plan Note (Signed)
BP Readings from Last 3 Encounters:  02/26/15 128/73  02/12/15 173/57  02/03/15 134/52    Lab Results  Component Value Date   NA 137 02/12/2015   K 3.9 02/12/2015   CREATININE 1.25* 02/12/2015    Assessment: Blood pressure control:  controlled Progress toward BP goal:   at goal Comments: Compliant with amlodipine 10mg  daily, Toprol XL 25mg  daily.  ACEI stopped recently due to AKI on CKD.  Will keep off for now.  Plan: Medications:  continue current medications: amlodipine 10mg  daily, Toprol XL 25mg  daily.  Educational resources provided: brochure (denies) Self management tools provided:   Other plans: RTC next month

## 2015-02-28 ENCOUNTER — Other Ambulatory Visit: Payer: Self-pay | Admitting: Internal Medicine

## 2015-02-28 DIAGNOSIS — M109 Gout, unspecified: Secondary | ICD-10-CM | POA: Diagnosis not present

## 2015-03-01 DIAGNOSIS — M109 Gout, unspecified: Secondary | ICD-10-CM | POA: Diagnosis not present

## 2015-03-03 DIAGNOSIS — M109 Gout, unspecified: Secondary | ICD-10-CM | POA: Diagnosis not present

## 2015-03-03 NOTE — Telephone Encounter (Signed)
Dr Redmond Pulling saw pt 02/26/15 at [facility].

## 2015-03-04 DIAGNOSIS — M109 Gout, unspecified: Secondary | ICD-10-CM | POA: Diagnosis not present

## 2015-03-05 DIAGNOSIS — M109 Gout, unspecified: Secondary | ICD-10-CM | POA: Diagnosis not present

## 2015-03-06 DIAGNOSIS — M109 Gout, unspecified: Secondary | ICD-10-CM | POA: Diagnosis not present

## 2015-03-07 DIAGNOSIS — M109 Gout, unspecified: Secondary | ICD-10-CM | POA: Diagnosis not present

## 2015-03-08 DIAGNOSIS — M109 Gout, unspecified: Secondary | ICD-10-CM | POA: Diagnosis not present

## 2015-03-10 ENCOUNTER — Ambulatory Visit (INDEPENDENT_AMBULATORY_CARE_PROVIDER_SITE_OTHER): Payer: Medicare Other | Admitting: Pharmacist

## 2015-03-10 DIAGNOSIS — M109 Gout, unspecified: Secondary | ICD-10-CM | POA: Diagnosis not present

## 2015-03-10 DIAGNOSIS — Z7901 Long term (current) use of anticoagulants: Secondary | ICD-10-CM | POA: Diagnosis not present

## 2015-03-10 LAB — POCT INR: INR: 2.4

## 2015-03-10 NOTE — Progress Notes (Signed)
Internal Medicine Clinic Attending  Case discussed with Dr. Wilson soon after the resident saw the patient.  We reviewed the resident's history and exam and pertinent patient test results.  I agree with the assessment, diagnosis, and plan of care documented in the resident's note.  

## 2015-03-10 NOTE — Progress Notes (Signed)
Anti-Coagulation Progress Note  [name] is a 81 y.o. female who is currently on an anti-coagulation regimen.    RECENT RESULTS: Recent results are below, the most recent result is correlated with a dose of 40 mg. per week: Lab Results  Component Value Date   INR 2.40 03/10/2015   INR 2.90 02/17/2015   INR 4.52* 02/12/2015    ANTI-COAG DOSE: Anticoagulation Dose Instructions as of 03/10/2015      [name] Tue Wed Thu Fri Sat   New Dose 5 mg 5 mg 5 mg 7.5 mg 5 mg 7.5 mg 5 mg       ANTICOAG SUMMARY: Anticoagulation Episode Summary    Current INR goal    Next INR check 04/07/2015   INR from last check 2.40 (03/10/2015)   Weekly max dose    Target end date    INR check location    Preferred lab    Send INR reminders to       Comments         ANTICOAG TODAY: Anticoagulation Summary as of 03/10/2015    INR goal    Selected INR 2.40 (03/10/2015)   Next INR check 04/07/2015   Target end date     Anticoagulation Episode Summary    INR check location    Preferred lab    Send INR reminders to    Comments       PATIENT INSTRUCTIONS: Patient Instructions  Patient instructed to take medications as defined in the Anti-coagulation Track section of this encounter.  Patient instructed to take today's dose.  Patient verbalized understanding of these instructions.       FOLLOW-UP Return in 4 weeks (on 04/07/2015) for Follow up INR at 4:30PM.  [name], III Pharm.D., CACP

## 2015-03-10 NOTE — Patient Instructions (Signed)
Patient instructed to take medications as defined in the Anti-coagulation Track section of this encounter.  Patient instructed to take today's dose.  Patient verbalized understanding of these instructions.    

## 2015-03-11 DIAGNOSIS — M109 Gout, unspecified: Secondary | ICD-10-CM | POA: Diagnosis not present

## 2015-03-12 DIAGNOSIS — M109 Gout, unspecified: Secondary | ICD-10-CM | POA: Diagnosis not present

## 2015-03-13 DIAGNOSIS — M109 Gout, unspecified: Secondary | ICD-10-CM | POA: Diagnosis not present

## 2015-03-14 DIAGNOSIS — M109 Gout, unspecified: Secondary | ICD-10-CM | POA: Diagnosis not present

## 2015-03-15 DIAGNOSIS — M109 Gout, unspecified: Secondary | ICD-10-CM | POA: Diagnosis not present

## 2015-03-17 DIAGNOSIS — M109 Gout, unspecified: Secondary | ICD-10-CM | POA: Diagnosis not present

## 2015-03-18 DIAGNOSIS — M109 Gout, unspecified: Secondary | ICD-10-CM | POA: Diagnosis not present

## 2015-03-19 DIAGNOSIS — M109 Gout, unspecified: Secondary | ICD-10-CM | POA: Diagnosis not present

## 2015-03-20 DIAGNOSIS — M109 Gout, unspecified: Secondary | ICD-10-CM | POA: Diagnosis not present

## 2015-03-21 DIAGNOSIS — M109 Gout, unspecified: Secondary | ICD-10-CM | POA: Diagnosis not present

## 2015-03-22 DIAGNOSIS — M109 Gout, unspecified: Secondary | ICD-10-CM | POA: Diagnosis not present

## 2015-03-24 DIAGNOSIS — M109 Gout, unspecified: Secondary | ICD-10-CM | POA: Diagnosis not present

## 2015-03-25 ENCOUNTER — Other Ambulatory Visit: Payer: Self-pay | Admitting: Internal Medicine

## 2015-03-25 DIAGNOSIS — M109 Gout, unspecified: Secondary | ICD-10-CM | POA: Diagnosis not present

## 2015-03-26 DIAGNOSIS — M109 Gout, unspecified: Secondary | ICD-10-CM | POA: Diagnosis not present

## 2015-03-27 ENCOUNTER — Telehealth: Payer: Self-pay | Admitting: Internal Medicine

## 2015-03-27 DIAGNOSIS — M109 Gout, unspecified: Secondary | ICD-10-CM | POA: Diagnosis not present

## 2015-03-27 NOTE — Telephone Encounter (Signed)
Message sent to attending pool. Request is still pending. Talked with pt.

## 2015-03-27 NOTE — Telephone Encounter (Signed)
Pt called requesting warfarin to be filled.

## 2015-03-27 NOTE — Telephone Encounter (Signed)
Pt aware.

## 2015-03-28 DIAGNOSIS — M109 Gout, unspecified: Secondary | ICD-10-CM | POA: Diagnosis not present

## 2015-03-29 DIAGNOSIS — M109 Gout, unspecified: Secondary | ICD-10-CM | POA: Diagnosis not present

## 2015-03-31 DIAGNOSIS — M109 Gout, unspecified: Secondary | ICD-10-CM | POA: Diagnosis not present

## 2015-04-01 DIAGNOSIS — M109 Gout, unspecified: Secondary | ICD-10-CM | POA: Diagnosis not present

## 2015-04-02 ENCOUNTER — Encounter: Payer: Self-pay | Admitting: Internal Medicine

## 2015-04-02 ENCOUNTER — Ambulatory Visit (INDEPENDENT_AMBULATORY_CARE_PROVIDER_SITE_OTHER): Payer: Medicare Other | Admitting: Internal Medicine

## 2015-04-02 VITALS — BP 136/73 | HR 67 | Temp 97.7°F | Ht 62.0 in | Wt 157.7 lb

## 2015-04-02 DIAGNOSIS — M109 Gout, unspecified: Secondary | ICD-10-CM | POA: Diagnosis not present

## 2015-04-02 DIAGNOSIS — I10 Essential (primary) hypertension: Secondary | ICD-10-CM | POA: Diagnosis not present

## 2015-04-02 DIAGNOSIS — E119 Type 2 diabetes mellitus without complications: Secondary | ICD-10-CM

## 2015-04-02 DIAGNOSIS — Z7901 Long term (current) use of anticoagulants: Secondary | ICD-10-CM

## 2015-04-02 DIAGNOSIS — Z79899 Other long term (current) drug therapy: Secondary | ICD-10-CM | POA: Diagnosis not present

## 2015-04-02 DIAGNOSIS — E118 Type 2 diabetes mellitus with unspecified complications: Secondary | ICD-10-CM

## 2015-04-02 DIAGNOSIS — Z86718 Personal history of other venous thrombosis and embolism: Secondary | ICD-10-CM

## 2015-04-02 LAB — GLUCOSE, CAPILLARY: Glucose-Capillary: 242 mg/dL — ABNORMAL HIGH (ref 65–99)

## 2015-04-02 LAB — POCT GLYCOSYLATED HEMOGLOBIN (HGB A1C): Hemoglobin A1C: 6.3

## 2015-04-02 NOTE — Assessment & Plan Note (Addendum)
Lab Results  Component Value Date   HGBA1C 6.3 04/02/2015   HGBA1C 6.9 01/01/2015   HGBA1C 6.9 09/25/2014     Assessment: Diabetes control:  well controlled Progress toward A1C goal:  at goal Comments: CBGs logs reveal fasting AM CBGs 90s-150.  Compliant with glipizide 10mg  daily and Januvia 25mg  daily.  Plan: Medications:  DISCONTINUE glipizide to avoid hypoglycemia.  CONTINUE Januvia 25mg  daily. Home glucose monitoring: yes Frequency: daily Timing:  qAM Instruction/counseling given: reminded to bring blood glucose meter & log to each visit Educational resources provided: brochure (denied) Self management tools provided: home glucose logbook Other plans: RTC in 3 months for follow-up.

## 2015-04-02 NOTE — Patient Instructions (Signed)
1. Your diabetes is well controlled.  Since it is so well controlled please STOP taking glipizide.  Keep taking Januvia.     2. Please take all medications as prescribed.    3. If you have worsening of your symptoms or new symptoms arise, please call the clinic [phone]), or go to the ER immediately if symptoms are severe.   Please come back to see me in 3 months.

## 2015-04-02 NOTE — Assessment & Plan Note (Signed)
Patient reports hx of two unprovoked DVTs.  Will continue coumadin for now given increased risk of VTE.  Will need continued reassessment of bleeding risk.  Currently with good functional status, no recent falls, no bleeding events.

## 2015-04-02 NOTE — Progress Notes (Signed)
Subjective:    Patient ID: Chelsea Mcdaniel, female    DOB: [birth year], 81 y.o.   MRN: [id number]  HPI Comments: Chelsea Mcdaniel is an 81 year old woman with PMH as below here for follow-up of chronic conditions.  Please see problem based charting for assessment and plan.   Past Medical History  Diagnosis Date  . Diabetes mellitus   . Hyperlipidemia   . Hypertension   . CVA (cerebrovascular accident)  October 2007     CT of the head atrophy with multiple remote insults noted but no definite acute findings  . Blindness of left eye      likely related to stroke, left cataract removed from that eye with complications  . Anemia      normocytic anemia with baseline hemoglobin 10-11  . Chronic kidney disease 2006     left renal artery stenosis with probable hemodynamic significance, kidneys are normal in morphology without focal lesions or hydronephrosis this is based but cannot A. of the abdomen with and without contrast done on the 31st 2006  . Glaucoma   . Personal history of DVT (deep vein thrombosis) 08/11/2010  . Calculus gallbladder and bile duct with cholecystitis with obstruction 01/2015    Current Outpatient Prescriptions on File Prior to Visit  Medication Sig Dispense Refill  . ACCU-CHEK AVIVA PLUS test strip Use 1 time daily to check blood sugar. 100 each 11  . ACCU-CHEK SOFTCLIX LANCETS lancets Use 1 time daily to check blood sugar. 100 each 1  . acetaminophen (TYLENOL) 325 MG tablet Take 2 tablets (650 mg total) by mouth every 6 (six) hours as needed.    Marland Kitchen amLODipine (NORVASC) 10 MG tablet Take 1 tablet (10 mg total) by mouth daily. For blood pressure 90 tablet 3  . Calcium Carbonate-Vitamin D 600-400 MG-UNIT per tablet Take 1 tablet by mouth 2 (two) times daily.    . colchicine 0.6 MG tablet Take 1 tablet (0.6 mg total) by mouth daily. 14 tablet 0  . CRESTOR 5 MG tablet take 1 tablet by mouth at bedtime 90 tablet 1  . glipiZIDE (GLUCOTROL XL) 10 MG 24 hr tablet TAKE ONE TABLET BY  MOUTH EVERY DAY 30 tablet 0  . HYDROcodone-acetaminophen (NORCO/VICODIN) 5-325 MG per tablet Take 1 tablet by mouth every 6 (six) hours as needed for moderate pain. 20 tablet 0  . LUMIGAN 0.01 % SOLN Place 1 drop into both eyes every morning.     . metoprolol succinate (TOPROL-XL) 25 MG 24 hr tablet TAKE ONE-HALF TABLET BY MOUTH EVERY DAY 45 tablet 3  . senna-docusate (SENOKOT-S) 8.6-50 MG per tablet Take 1 tablet by mouth at bedtime as needed for mild constipation. 30 tablet 2  . simethicone (GAS-X) 80 MG chewable tablet Chew 1 tablet (80 mg total) by mouth every 6 (six) hours as needed for flatulence. 30 tablet 0  . sitaGLIPtin (JANUVIA) 25 MG tablet Take 1 tablet (25 mg total) by mouth daily. 30 tablet 2  . warfarin (COUMADIN) 5 MG tablet Take 1 tablet by mouth daily, except 1.5 tablets (7.5mg ) on Wednesdays and Fridays 36 tablet 2   No current facility-administered medications on file prior to visit.    Review of Systems  Constitutional: Negative for fever, chills and appetite change.  Respiratory: Negative for shortness of breath.   Cardiovascular: Positive for leg swelling. Negative for chest pain and palpitations.       Occasional B/L ankle swelling.  Gastrointestinal: Negative for nausea, vomiting, abdominal pain  and diarrhea.  Neurological: Negative for light-headedness.  Hematological: Does not bruise/bleed easily.       Filed Vitals:   04/02/15 1350  BP: 136/73  Pulse: 67  Temp: 97.7 F (36.5 C)  TempSrc: Oral  Height: 5\' 2"  (1.575 m)  Weight: 157 lb 11.2 oz (71.532 kg)  SpO2: 100%    Objective:   Physical Exam  Constitutional: Chelsea Mcdaniel is oriented to person, place, and time. Chelsea Mcdaniel appears well-developed. No distress.  HENT:  Head: Normocephalic and atraumatic.  Mouth/Throat: Oropharynx is clear and moist. No oropharyngeal exudate.  Eyes: EOM are normal.  Left eye with post-op changes.  Neck: Neck supple.  Cardiovascular: Normal rate, regular rhythm and normal heart  sounds.  Exam reveals no gallop and no friction rub.   No murmur heard. Pulmonary/Chest: Effort normal and breath sounds normal. No respiratory distress. Chelsea Mcdaniel has no wheezes. Chelsea Mcdaniel has no rales.  Abdominal: Soft. Bowel sounds are normal. Chelsea Mcdaniel exhibits no distension. There is no tenderness. There is no rebound.  Musculoskeletal: Normal range of motion. Chelsea Mcdaniel exhibits edema. Chelsea Mcdaniel exhibits no tenderness.  Trace B/L LE edema  Neurological: Chelsea Mcdaniel is alert and oriented to person, place, and time. No cranial nerve deficit.  Strength and sensation equal and intact upper and lower extremities.  Skin: Skin is warm. Chelsea Mcdaniel is not diaphoretic.  Psychiatric: Chelsea Mcdaniel has a normal mood and affect. Her behavior is normal. Judgment and thought content normal.  Vitals reviewed.         Assessment & Plan:  Please see problem based charting for assessment and plan.

## 2015-04-02 NOTE — Progress Notes (Signed)
Internal Medicine Clinic Attending  Case discussed with Dr. Wilson soon after the resident saw the patient.  We reviewed the resident's history and exam and pertinent patient test results.  I agree with the assessment, diagnosis, and plan of care documented in the resident's note.  

## 2015-04-02 NOTE — Assessment & Plan Note (Signed)
BP Readings from Last 3 Encounters:  04/02/15 136/73  02/26/15 128/73  02/12/15 173/57    Lab Results  Component Value Date   NA 137 02/12/2015   K 3.9 02/12/2015   CREATININE 1.25* 02/12/2015    Assessment: Blood pressure control:  well controlled Progress toward BP goal:   at goal Comments: Compliant with meds.  Plan: Medications:  continue current medications:  Amlodipine 10mg  daily, Toprol XL 25mg  daily Educational resources provided: brochure (denied) Self management tools provided:   Other plans: RTC in 3 months.

## 2015-04-03 ENCOUNTER — Other Ambulatory Visit: Payer: Self-pay | Admitting: Internal Medicine

## 2015-04-03 DIAGNOSIS — M109 Gout, unspecified: Secondary | ICD-10-CM | POA: Diagnosis not present

## 2015-04-04 ENCOUNTER — Other Ambulatory Visit: Payer: Self-pay | Admitting: Internal Medicine

## 2015-04-04 DIAGNOSIS — M109 Gout, unspecified: Secondary | ICD-10-CM | POA: Diagnosis not present

## 2015-04-05 DIAGNOSIS — M109 Gout, unspecified: Secondary | ICD-10-CM | POA: Diagnosis not present

## 2015-04-06 ENCOUNTER — Other Ambulatory Visit: Payer: Self-pay | Admitting: Internal Medicine

## 2015-04-06 DIAGNOSIS — E119 Type 2 diabetes mellitus without complications: Secondary | ICD-10-CM

## 2015-04-06 MED ORDER — ACCU-CHEK AVIVA DEVI: Status: AC

## 2015-04-07 ENCOUNTER — Ambulatory Visit (INDEPENDENT_AMBULATORY_CARE_PROVIDER_SITE_OTHER): Payer: Medicare Other | Admitting: Pharmacist

## 2015-04-07 DIAGNOSIS — Z7901 Long term (current) use of anticoagulants: Secondary | ICD-10-CM

## 2015-04-07 DIAGNOSIS — M109 Gout, unspecified: Secondary | ICD-10-CM | POA: Diagnosis not present

## 2015-04-07 LAB — POCT INR: INR: 2.1

## 2015-04-07 NOTE — Patient Instructions (Signed)
Patient instructed to take medications as defined in the Anti-coagulation Track section of this encounter.  Patient instructed to tke today's dose.  Patient verbalized understanding of these instructions.

## 2015-04-07 NOTE — Progress Notes (Signed)
Anti-Coagulation Progress Note  Chelsea Mcdaniel is a 81 y.o. female who is currently on an anti-coagulation regimen.    RECENT RESULTS: Recent results are below, the most recent result is correlated with a dose of 40 mg. per week: Lab Results  Component Value Date   INR 2.10 04/07/2015   INR 2.40 03/10/2015   INR 2.90 02/17/2015    ANTI-COAG DOSE: Anticoagulation Dose Instructions as of 04/07/2015      [name] Tue Wed Thu Fri Sat   New Dose 5 mg 7.5 mg 5 mg 7.5 mg 5 mg 7.5 mg 5 mg       ANTICOAG SUMMARY: Anticoagulation Episode Summary    Current INR goal    Next INR check 04/28/2015   INR from last check 2.10 (04/07/2015)   Weekly max dose    Target end date    INR check location    Preferred lab    Send INR reminders to       Comments         ANTICOAG TODAY: Anticoagulation Summary as of 04/07/2015    INR goal    Selected INR 2.10 (04/07/2015)   Next INR check 04/28/2015   Target end date     Anticoagulation Episode Summary    INR check location    Preferred lab    Send INR reminders to    Comments       PATIENT INSTRUCTIONS: Patient Instructions  Patient instructed to take medications as defined in the Anti-coagulation Track section of this encounter.  Patient instructed to tke today's dose.  Patient verbalized understanding of these instructions.       FOLLOW-UP Return in 3 weeks (on 04/28/2015) for Follow up INR at [place], III Pharm.D., CACP

## 2015-04-08 DIAGNOSIS — M109 Gout, unspecified: Secondary | ICD-10-CM | POA: Diagnosis not present

## 2015-04-09 ENCOUNTER — Telehealth: Payer: Self-pay | Admitting: *Deleted

## 2015-04-09 DIAGNOSIS — M109 Gout, unspecified: Secondary | ICD-10-CM | POA: Diagnosis not present

## 2015-04-09 NOTE — Telephone Encounter (Signed)
Daughter of pt called - to check what med was stopped. Pt unable to find papers.  Dr Redmond Pulling on visit 04/02/15 suggest to stop glipizide and to continue with Januvia. Daughter stated she understood. Hilda Blades Brentyn Seehafer RN 04/10/15 2:30PM

## 2015-04-10 DIAGNOSIS — M109 Gout, unspecified: Secondary | ICD-10-CM | POA: Diagnosis not present

## 2015-04-11 DIAGNOSIS — M109 Gout, unspecified: Secondary | ICD-10-CM | POA: Diagnosis not present

## 2015-04-12 DIAGNOSIS — M109 Gout, unspecified: Secondary | ICD-10-CM | POA: Diagnosis not present

## 2015-04-14 DIAGNOSIS — M109 Gout, unspecified: Secondary | ICD-10-CM | POA: Diagnosis not present

## 2015-04-15 DIAGNOSIS — M109 Gout, unspecified: Secondary | ICD-10-CM | POA: Diagnosis not present

## 2015-04-16 DIAGNOSIS — M109 Gout, unspecified: Secondary | ICD-10-CM | POA: Diagnosis not present

## 2015-04-17 DIAGNOSIS — M109 Gout, unspecified: Secondary | ICD-10-CM | POA: Diagnosis not present

## 2015-04-18 DIAGNOSIS — M109 Gout, unspecified: Secondary | ICD-10-CM | POA: Diagnosis not present

## 2015-04-19 DIAGNOSIS — M109 Gout, unspecified: Secondary | ICD-10-CM | POA: Diagnosis not present

## 2015-04-21 DIAGNOSIS — M109 Gout, unspecified: Secondary | ICD-10-CM | POA: Diagnosis not present

## 2015-04-22 DIAGNOSIS — M109 Gout, unspecified: Secondary | ICD-10-CM | POA: Diagnosis not present

## 2015-04-23 DIAGNOSIS — M109 Gout, unspecified: Secondary | ICD-10-CM | POA: Diagnosis not present

## 2015-04-24 DIAGNOSIS — M109 Gout, unspecified: Secondary | ICD-10-CM | POA: Diagnosis not present

## 2015-04-25 DIAGNOSIS — M109 Gout, unspecified: Secondary | ICD-10-CM | POA: Diagnosis not present

## 2015-04-26 DIAGNOSIS — M109 Gout, unspecified: Secondary | ICD-10-CM | POA: Diagnosis not present

## 2015-04-28 ENCOUNTER — Ambulatory Visit (INDEPENDENT_AMBULATORY_CARE_PROVIDER_SITE_OTHER): Payer: Medicare Other | Admitting: Pharmacist

## 2015-04-28 DIAGNOSIS — Z7901 Long term (current) use of anticoagulants: Secondary | ICD-10-CM | POA: Diagnosis not present

## 2015-04-28 DIAGNOSIS — M109 Gout, unspecified: Secondary | ICD-10-CM | POA: Diagnosis not present

## 2015-04-28 DIAGNOSIS — Z86718 Personal history of other venous thrombosis and embolism: Secondary | ICD-10-CM

## 2015-04-28 LAB — POCT INR: INR: 2.9

## 2015-04-28 NOTE — Patient Instructions (Signed)
Patient instructed to take medications as defined in the Anti-coagulation Track section of this encounter.  Patient instructed to take today's dose.  Patient verbalized understanding of these instructions.    

## 2015-04-28 NOTE — Progress Notes (Signed)
Anti-Coagulation Progress Note  Chelsea Mcdaniel is a 81 y.o. female who is currently on an anti-coagulation regimen.    RECENT RESULTS: Recent results are below, the most recent result is correlated with a dose of 42.5 mg. per week: Lab Results  Component Value Date   INR 2.90 04/28/2015   INR 2.10 04/07/2015   INR 2.40 03/10/2015    ANTI-COAG DOSE: Anticoagulation Dose Instructions as of 04/28/2015      [name] Tue Wed Thu Fri Sat   New Dose 5 mg 5 mg 5 mg 7.5 mg 5 mg 7.5 mg 5 mg       ANTICOAG SUMMARY: Anticoagulation Episode Summary    Current INR goal    Next INR check 05/19/2015   INR from last check 2.90 (04/28/2015)   Weekly max dose    Target end date    INR check location    Preferred lab    Send INR reminders to       Comments         ANTICOAG TODAY: Anticoagulation Summary as of 04/28/2015    INR goal    Selected INR 2.90 (04/28/2015)   Next INR check 05/19/2015   Target end date     Anticoagulation Episode Summary    INR check location    Preferred lab    Send INR reminders to    Comments       PATIENT INSTRUCTIONS: Patient Instructions  Patient instructed to take medications as defined in the Anti-coagulation Track section of this encounter.  Patient instructed to take today's dose.  Patient verbalized understanding of these instructions.       FOLLOW-UP Return in 3 weeks (on 05/19/2015) for Follow up INR at 4:15PM.  [name], III Pharm.D., CACP

## 2015-04-29 DIAGNOSIS — M109 Gout, unspecified: Secondary | ICD-10-CM | POA: Diagnosis not present

## 2015-04-29 NOTE — Progress Notes (Signed)
INTERNAL MEDICINE TEACHING ATTENDING ADDENDUM - Lalla Brothers M.D  Duration- indefinite, Indication- two unprovoked DVT, INR- 2.9. Agree with pharmacy recommendations as outlined in their note.

## 2015-04-30 DIAGNOSIS — M109 Gout, unspecified: Secondary | ICD-10-CM | POA: Diagnosis not present

## 2015-05-01 DIAGNOSIS — M109 Gout, unspecified: Secondary | ICD-10-CM | POA: Diagnosis not present

## 2015-05-02 DIAGNOSIS — M109 Gout, unspecified: Secondary | ICD-10-CM | POA: Diagnosis not present

## 2015-05-03 DIAGNOSIS — M109 Gout, unspecified: Secondary | ICD-10-CM | POA: Diagnosis not present

## 2015-05-05 DIAGNOSIS — M109 Gout, unspecified: Secondary | ICD-10-CM | POA: Diagnosis not present

## 2015-05-06 DIAGNOSIS — M109 Gout, unspecified: Secondary | ICD-10-CM | POA: Diagnosis not present

## 2015-05-07 DIAGNOSIS — M109 Gout, unspecified: Secondary | ICD-10-CM | POA: Diagnosis not present

## 2015-05-08 DIAGNOSIS — M109 Gout, unspecified: Secondary | ICD-10-CM | POA: Diagnosis not present

## 2015-05-09 DIAGNOSIS — M109 Gout, unspecified: Secondary | ICD-10-CM | POA: Diagnosis not present

## 2015-05-10 DIAGNOSIS — M109 Gout, unspecified: Secondary | ICD-10-CM | POA: Diagnosis not present

## 2015-05-12 DIAGNOSIS — M109 Gout, unspecified: Secondary | ICD-10-CM | POA: Diagnosis not present

## 2015-05-13 DIAGNOSIS — M109 Gout, unspecified: Secondary | ICD-10-CM | POA: Diagnosis not present

## 2015-05-14 DIAGNOSIS — M109 Gout, unspecified: Secondary | ICD-10-CM | POA: Diagnosis not present

## 2015-05-15 DIAGNOSIS — M109 Gout, unspecified: Secondary | ICD-10-CM | POA: Diagnosis not present

## 2015-05-16 DIAGNOSIS — M109 Gout, unspecified: Secondary | ICD-10-CM | POA: Diagnosis not present

## 2015-05-17 DIAGNOSIS — M109 Gout, unspecified: Secondary | ICD-10-CM | POA: Diagnosis not present

## 2015-05-19 ENCOUNTER — Ambulatory Visit (INDEPENDENT_AMBULATORY_CARE_PROVIDER_SITE_OTHER): Payer: Medicare Other | Admitting: Pharmacist

## 2015-05-19 DIAGNOSIS — Z8673 Personal history of transient ischemic attack (TIA), and cerebral infarction without residual deficits: Secondary | ICD-10-CM | POA: Diagnosis not present

## 2015-05-19 DIAGNOSIS — Z7901 Long term (current) use of anticoagulants: Secondary | ICD-10-CM | POA: Diagnosis not present

## 2015-05-19 DIAGNOSIS — M109 Gout, unspecified: Secondary | ICD-10-CM | POA: Diagnosis not present

## 2015-05-19 LAB — POCT INR: INR: 3

## 2015-05-19 NOTE — Patient Instructions (Signed)
Patient instructed to take medications as defined in the Anti-coagulation Track section of this encounter.  Patient instructed to take today's dose.  Patient verbalized understanding of these instructions.    

## 2015-05-19 NOTE — Progress Notes (Signed)
Anti-Coagulation Progress Note  Chelsea Mcdaniel is a 81 y.o. female who is currently on an anti-coagulation regimen.    RECENT RESULTS: Recent results are below, the most recent result is correlated with a dose of 40 mg. per week: Lab Results  Component Value Date   INR 3.0 05/19/2015   INR 2.90 04/28/2015   INR 2.10 04/07/2015    ANTI-COAG DOSE: Anticoagulation Dose Instructions as of 05/19/2015      [name] Tue Wed Thu Fri Sat   New Dose 5 mg 5 mg 5 mg 7.5 mg 5 mg 5 mg 5 mg       ANTICOAG SUMMARY: Anticoagulation Episode Summary    Current INR goal    Next INR check 06/09/2015   INR from last check 3.0 (05/19/2015)   Weekly max dose    Target end date    INR check location    Preferred lab    Send INR reminders to       Comments         ANTICOAG TODAY: Anticoagulation Summary as of 05/19/2015    INR goal    Selected INR 3.0 (05/19/2015)   Next INR check 06/09/2015   Target end date     Anticoagulation Episode Summary    INR check location    Preferred lab    Send INR reminders to    Comments       PATIENT INSTRUCTIONS: Patient Instructions  Patient instructed to take medications as defined in the Anti-coagulation Track section of this encounter.  Patient instructed to take today's dose.  Patient verbalized understanding of these instructions.       FOLLOW-UP Return in 3 weeks (on 06/09/2015) for Follow up INR at 4:30PM.  [name], III Pharm.D., CACP

## 2015-05-20 DIAGNOSIS — M109 Gout, unspecified: Secondary | ICD-10-CM | POA: Diagnosis not present

## 2015-05-20 NOTE — Progress Notes (Signed)
Indication: Previous DVT and CVAs while on plavix and ASA. Duration: As long as benefits outweigh risks. INR: At target. Agree with Dr. Gladstone Pih assessment and plan.

## 2015-05-21 ENCOUNTER — Other Ambulatory Visit: Payer: Self-pay | Admitting: Student in an Organized Health Care Education/Training Program

## 2015-05-21 DIAGNOSIS — M109 Gout, unspecified: Secondary | ICD-10-CM | POA: Diagnosis not present

## 2015-05-22 DIAGNOSIS — M109 Gout, unspecified: Secondary | ICD-10-CM | POA: Diagnosis not present

## 2015-05-23 DIAGNOSIS — M109 Gout, unspecified: Secondary | ICD-10-CM | POA: Diagnosis not present

## 2015-05-23 NOTE — Telephone Encounter (Signed)
Spoke with pharmacy, they confirm receipt of refill authorization

## 2015-05-24 DIAGNOSIS — M109 Gout, unspecified: Secondary | ICD-10-CM | POA: Diagnosis not present

## 2015-05-26 DIAGNOSIS — M109 Gout, unspecified: Secondary | ICD-10-CM | POA: Diagnosis not present

## 2015-05-27 DIAGNOSIS — M109 Gout, unspecified: Secondary | ICD-10-CM | POA: Diagnosis not present

## 2015-05-28 DIAGNOSIS — M109 Gout, unspecified: Secondary | ICD-10-CM | POA: Diagnosis not present

## 2015-05-29 ENCOUNTER — Other Ambulatory Visit: Payer: Self-pay | Admitting: Internal Medicine

## 2015-05-29 DIAGNOSIS — M109 Gout, unspecified: Secondary | ICD-10-CM | POA: Diagnosis not present

## 2015-05-30 DIAGNOSIS — M109 Gout, unspecified: Secondary | ICD-10-CM | POA: Diagnosis not present

## 2015-05-31 DIAGNOSIS — M109 Gout, unspecified: Secondary | ICD-10-CM | POA: Diagnosis not present

## 2015-06-02 DIAGNOSIS — M109 Gout, unspecified: Secondary | ICD-10-CM | POA: Diagnosis not present

## 2015-06-03 DIAGNOSIS — M109 Gout, unspecified: Secondary | ICD-10-CM | POA: Diagnosis not present

## 2015-06-04 DIAGNOSIS — M109 Gout, unspecified: Secondary | ICD-10-CM | POA: Diagnosis not present

## 2015-06-04 NOTE — Telephone Encounter (Signed)
Pt recently picked up her last 90-day refill, per pharmacy they always submit another refill request when pt gets their last prescription.  Pt does not need a refill at this time and will follow up with pcp on 07/02/15.Regenia Skeeter, Samuele Storey Cassady11/9/201611:35 AM

## 2015-06-05 DIAGNOSIS — M109 Gout, unspecified: Secondary | ICD-10-CM | POA: Diagnosis not present

## 2015-06-06 DIAGNOSIS — M109 Gout, unspecified: Secondary | ICD-10-CM | POA: Diagnosis not present

## 2015-06-07 DIAGNOSIS — M109 Gout, unspecified: Secondary | ICD-10-CM | POA: Diagnosis not present

## 2015-06-09 ENCOUNTER — Ambulatory Visit (INDEPENDENT_AMBULATORY_CARE_PROVIDER_SITE_OTHER): Payer: Medicare Other | Admitting: Pharmacist

## 2015-06-09 DIAGNOSIS — Z7901 Long term (current) use of anticoagulants: Secondary | ICD-10-CM | POA: Diagnosis not present

## 2015-06-09 DIAGNOSIS — M109 Gout, unspecified: Secondary | ICD-10-CM | POA: Diagnosis not present

## 2015-06-09 LAB — POCT INR: INR: 2.1

## 2015-06-09 MED ORDER — WARFARIN SODIUM 5 MG PO TABS: ORAL_TABLET | ORAL | Status: DC

## 2015-06-09 NOTE — Patient Instructions (Signed)
Patient instructed to take medications as defined in the Anti-coagulation Track section of this encounter.  Patient instructed to take today's dose.  Patient verbalized understanding of these instructions.    

## 2015-06-09 NOTE — Progress Notes (Signed)
Anti-Coagulation Progress Note  Chelsea Mcdaniel is a 81 y.o. female who is currently on an anti-coagulation regimen.    RECENT RESULTS: Recent results are below, the most recent result is correlated with a dose of 37.5 mg. per week: Lab Results  Component Value Date   INR 2.10 06/09/2015   INR 3.0 05/19/2015   INR 2.90 04/28/2015    ANTI-COAG DOSE: Anticoagulation Dose Instructions as of 06/09/2015      [name] Tue Wed Thu Fri Sat   New Dose 5 mg 5 mg 5 mg 7.5 mg 5 mg 5 mg 5 mg       ANTICOAG SUMMARY: Anticoagulation Episode Summary    Current INR goal    Next INR check 07/07/2015   INR from last check 2.10 (06/09/2015)   Weekly max dose    Target end date    INR check location    Preferred lab    Send INR reminders to       Comments         ANTICOAG TODAY: Anticoagulation Summary as of 06/09/2015    INR goal    Selected INR 2.10 (06/09/2015)   Next INR check 07/07/2015   Target end date     Anticoagulation Episode Summary    INR check location    Preferred lab    Send INR reminders to    Comments       PATIENT INSTRUCTIONS: Patient Instructions  Patient instructed to take medications as defined in the Anti-coagulation Track section of this encounter.  Patient instructed to take today's dose.  Patient verbalized understanding of these instructions.       FOLLOW-UP Return in 4 weeks (on 07/07/2015) for Follow up INR at 3:45PM.  [name], III Pharm.D., CACP

## 2015-06-10 DIAGNOSIS — M109 Gout, unspecified: Secondary | ICD-10-CM | POA: Diagnosis not present

## 2015-06-11 ENCOUNTER — Other Ambulatory Visit: Payer: Self-pay

## 2015-06-11 DIAGNOSIS — E1122 Type 2 diabetes mellitus with diabetic chronic kidney disease: Secondary | ICD-10-CM

## 2015-06-11 DIAGNOSIS — H401133 Primary open-angle glaucoma, bilateral, severe stage: Secondary | ICD-10-CM | POA: Diagnosis not present

## 2015-06-11 DIAGNOSIS — H2511 Age-related nuclear cataract, right eye: Secondary | ICD-10-CM | POA: Diagnosis not present

## 2015-06-11 DIAGNOSIS — H2702 Aphakia, left eye: Secondary | ICD-10-CM | POA: Diagnosis not present

## 2015-06-11 DIAGNOSIS — M109 Gout, unspecified: Secondary | ICD-10-CM | POA: Diagnosis not present

## 2015-06-12 DIAGNOSIS — M109 Gout, unspecified: Secondary | ICD-10-CM | POA: Diagnosis not present

## 2015-06-12 MED ORDER — SITAGLIPTIN PHOSPHATE 25 MG PO TABS: 25.0000 mg | ORAL_TABLET | Freq: Every day | ORAL | Status: DC

## 2015-06-13 DIAGNOSIS — M109 Gout, unspecified: Secondary | ICD-10-CM | POA: Diagnosis not present

## 2015-06-14 DIAGNOSIS — M109 Gout, unspecified: Secondary | ICD-10-CM | POA: Diagnosis not present

## 2015-07-02 ENCOUNTER — Ambulatory Visit (INDEPENDENT_AMBULATORY_CARE_PROVIDER_SITE_OTHER): Payer: Medicare Other | Admitting: Internal Medicine

## 2015-07-02 ENCOUNTER — Encounter: Payer: Self-pay | Admitting: Internal Medicine

## 2015-07-02 VITALS — BP 143/64 | HR 71 | Temp 97.8°F | Ht 62.0 in | Wt 159.7 lb

## 2015-07-02 DIAGNOSIS — E118 Type 2 diabetes mellitus with unspecified complications: Secondary | ICD-10-CM

## 2015-07-02 DIAGNOSIS — E1122 Type 2 diabetes mellitus with diabetic chronic kidney disease: Secondary | ICD-10-CM

## 2015-07-02 DIAGNOSIS — M1A3410 Chronic gout due to renal impairment, right hand, without tophus (tophi): Secondary | ICD-10-CM

## 2015-07-02 DIAGNOSIS — I10 Essential (primary) hypertension: Secondary | ICD-10-CM | POA: Diagnosis not present

## 2015-07-02 DIAGNOSIS — Z7901 Long term (current) use of anticoagulants: Secondary | ICD-10-CM

## 2015-07-02 DIAGNOSIS — Z0000 Encounter for general adult medical examination without abnormal findings: Secondary | ICD-10-CM

## 2015-07-02 LAB — GLUCOSE, CAPILLARY: GLUCOSE-CAPILLARY: 264 mg/dL — AB (ref 65–99)

## 2015-07-02 LAB — POCT GLYCOSYLATED HEMOGLOBIN (HGB A1C): Hemoglobin A1C: 7.4

## 2015-07-02 MED ORDER — SITAGLIPTIN PHOSPHATE 25 MG PO TABS: 25.0000 mg | ORAL_TABLET | Freq: Every day | ORAL | Status: DC

## 2015-07-02 MED ORDER — ACCU-CHEK SOFTCLIX LANCETS MISC: Status: DC

## 2015-07-02 NOTE — Assessment & Plan Note (Addendum)
Lab Results  Component Value Date   HGBA1C 7.4 07/02/2015   HGBA1C 6.3 04/02/2015   HGBA1C 6.9 01/01/2015     Assessment: Diabetes control:  fair Progress toward A1C goal:   worsening Comments: A1c up 1 point in 3 months.  She admits to dietary indiscretion (but says this is her norm, not a recent change) of drinking soda and sweet tea. Also eats hard candy during the day.  Plan: Medications:  continue current medications:  januvia 25mg  daily Home glucose monitoring:  yes Frequency:  daily Timing:   Instruction/counseling given: reminded to bring blood glucose meter & log to each visit Educational resources provided: brochure, handout Self management tools provided:   Other plans: advised she try and cut out sweet drinks and keep all else the same. She is 65 so will liberalize her goal, however she has been fairly well controlled in past.  Hope to see improvement with cutting out sweet drinks. RTC in 3 months.

## 2015-07-02 NOTE — Patient Instructions (Addendum)
1. Your Hgb A1c has increased from 6.3 to 7.4.  Please work on cutting out your sugar-sweetened drinks (soda, sweet tea) and replace them with sugar-free or diet drinks and water.  I am hoping this change in your diet will help keep your diabetes under control.  Please try compression stockings (sold at medical supply and retail stores) to help with your ankle swelling.  Putting your feet up when you are sitting should help with this as well.    2. Please take all medications as prescribed.    3. If you have worsening of your symptoms or new symptoms arise, please call the clinic [phone]), or go to the ER immediately if symptoms are severe.   Please come back to see me in 3 months or sooner if you have problems.

## 2015-07-02 NOTE — Assessment & Plan Note (Signed)
Assessment:  No recent gout attacks.  She says she has been drinking Cherry juice and feels this is working for her.  She is not interested in starting gout ppx at this time. Plan:  Monitor for symptoms recurrence.

## 2015-07-02 NOTE — Assessment & Plan Note (Addendum)
BP Readings from Last 3 Encounters:  07/02/15 143/64  04/02/15 136/73  02/26/15 128/73    Lab Results  Component Value Date   NA 137 02/12/2015   K 3.9 02/12/2015   CREATININE 1.25* 02/12/2015    Assessment: Blood pressure control:  good control Progress toward BP goal:   at goal Comments:  She reports compliance with medications.  She does not know names but her daughter-in-law fills her pillbox once per week.  Plan: Medications:  continue current medications:  Amlodipine 10mg  and Toprol XL 25mg  daily. Educational resources provided: brochure, handout, video Self management tools provided:   Other plans: RTC in 3 months for follow-up.

## 2015-07-02 NOTE — Assessment & Plan Note (Signed)
She declines flu, PNA and zostavax. Will continue to offer at future visits.

## 2015-07-02 NOTE — Assessment & Plan Note (Signed)
Assessment:  Hx of 2 unprovoked DVTs.  No recent falls or bleeding.  Discussed need to periodically weigh risk and benefit of continuing coumadin.  She would like to continue A/C at this time.  Recent INR 2.10. Plan:  Continue coumadin.  F/u with Dr. Elie Confer for INR check.

## 2015-07-02 NOTE — Progress Notes (Signed)
Subjective:    Patient ID: Chelsea Mcdaniel, female    DOB: September 07, 1927, 81 y.o.   MRN: [id number]  HPI Comments: [name] is an 81 year old woman with PMH as below here for follow-up of DM.  Please see problem based charting for the status of this and other chronic medical conditions.    Past Medical History  Diagnosis Date  . Diabetes mellitus   . Hyperlipidemia   . Hypertension   . CVA (cerebrovascular accident) [facility])  October 2007     CT of the head atrophy with multiple remote insults noted but no definite acute findings  . Blindness of left eye      likely related to stroke, left cataract removed from that eye with complications  . Anemia      normocytic anemia with baseline hemoglobin 10-11  . Chronic kidney disease 2006     left renal artery stenosis with probable hemodynamic significance, kidneys are normal in morphology without focal lesions or hydronephrosis this is based but cannot A. of the abdomen with and without contrast done on the 31st 2006  . Glaucoma   . Personal history of DVT (deep vein thrombosis) 08/11/2010  . Calculus gallbladder and bile duct with cholecystitis with obstruction 01/2015   Current Outpatient Prescriptions on File Prior to Visit  Medication Sig Dispense Refill  . ACCU-CHEK AVIVA PLUS test strip Use 1 time daily to check blood sugar. 100 each 11  . ACCU-CHEK SOFTCLIX LANCETS lancets Use 1 time daily to check blood sugar. 100 each 1  . acetaminophen (TYLENOL) 325 MG tablet Take 2 tablets (650 mg total) by mouth every 6 (six) hours as needed.    Marland Kitchen amLODipine (NORVASC) 10 MG tablet Take 1 tablet (10 mg total) by mouth daily. For blood pressure 90 tablet 3  . Blood Glucose Monitoring Suppl (ACCU-CHEK AVIVA) device Use as instructed 1 each 0  . Calcium Carbonate-Vitamin D 600-400 MG-UNIT per tablet Take 1 tablet by mouth 2 (two) times daily.    . CRESTOR 5 MG tablet take 1 tablet by mouth at bedtime 90 tablet 1  . LUMIGAN 0.01 % SOLN Place 1 drop  into both eyes every morning.     . metoprolol succinate (TOPROL-XL) 25 MG 24 hr tablet TAKE ONE-HALF TABLET BY MOUTH EVERY DAY 45 tablet 3  . senna-docusate (SENOKOT-S) 8.6-50 MG per tablet Take 1 tablet by mouth at bedtime as needed for mild constipation. 30 tablet 2  . simethicone (GAS-X) 80 MG chewable tablet Chew 1 tablet (80 mg total) by mouth every 6 (six) hours as needed for flatulence. 30 tablet 0  . sitaGLIPtin (JANUVIA) 25 MG tablet Take 1 tablet (25 mg total) by mouth daily. 90 tablet 0  . warfarin (COUMADIN) 5 MG tablet Take 1 tablet daily--EXCEPT on Wednesdays of every week--on Wednesdays, take 1&1/2 tablets. 30 tablet 1   No current facility-administered medications on file prior to visit.    Review of Systems  Constitutional: Negative for fever, chills, appetite change and unexpected weight change.  Respiratory: Negative for cough, shortness of breath and wheezing.   Cardiovascular: Negative for chest pain, palpitations and leg swelling.  Gastrointestinal: Positive for constipation. Negative for nausea, vomiting, abdominal pain, diarrhea and blood in stool.  Endocrine: Negative for polydipsia, polyphagia and polyuria.  Genitourinary: Negative for dysuria, vaginal bleeding, vaginal discharge and difficulty urinating.  Neurological: Negative for syncope, weakness, light-headedness and headaches.  Hematological: Does not bruise/bleed easily.  Psychiatric/Behavioral: Negative for dysphoric mood.  The patient is not nervous/anxious.        Says she sometimes feels sad over friends dying, some of whom are younger than her.  However, denies anhedonia, guilt, SI.  Says she is "glad to be among the living."   No falls, no urinary or fecal incontinence, no numbness/tingling.       Filed Vitals:   07/02/15 1409 07/02/15 1457  BP: 150/66 143/64  Pulse: 75 71  Temp: 97.8 F (36.6 C)   TempSrc: Oral   Height: 5\' 2"  (1.575 m)   Weight: 159 lb 11.2 oz (72.439 kg)      Objective:    Physical Exam  Constitutional: She is oriented to person, place, and time. She appears well-developed. No distress.  HENT:  Head: Normocephalic and atraumatic.  Mouth/Throat: Oropharynx is clear and moist. No oropharyngeal exudate.  Eyes: Conjunctivae and EOM are normal. Right eye exhibits no discharge. Left eye exhibits no discharge. No scleral icterus.  Left eye with post-op changes.  Neck: Neck supple.  Cardiovascular: Normal rate, regular rhythm and normal heart sounds.  Exam reveals no gallop and no friction rub.   No murmur heard. No carotid bruits.  Pulmonary/Chest: Effort normal and breath sounds normal. No respiratory distress. She has no wheezes. She has no rales.  Abdominal: Soft. Bowel sounds are normal. She exhibits no distension. There is no tenderness. There is no rebound and no guarding.  Musculoskeletal: Normal range of motion. She exhibits edema. She exhibits no tenderness.  B/L 1+ ankle edema  Neurological: She is alert and oriented to person, place, and time. No cranial nerve deficit.  Skin: Skin is warm. She is not diaphoretic.  Psychiatric: She has a normal mood and affect. Her behavior is normal. Judgment and thought content normal.  Vitals reviewed.         Assessment & Plan:  Please see problem based charting for A&P.

## 2015-07-03 NOTE — Progress Notes (Signed)
Internal Medicine Clinic Attending  Case discussed with Dr. Wilson soon after the resident saw the patient.  We reviewed the resident's history and exam and pertinent patient test results.  I agree with the assessment, diagnosis, and plan of care documented in the resident's note.  

## 2015-08-04 ENCOUNTER — Ambulatory Visit: Payer: Self-pay

## 2015-08-06 ENCOUNTER — Encounter: Payer: Self-pay | Admitting: Gastroenterology

## 2015-08-18 ENCOUNTER — Ambulatory Visit (INDEPENDENT_AMBULATORY_CARE_PROVIDER_SITE_OTHER): Payer: Medicare Other | Admitting: Pharmacist

## 2015-08-18 DIAGNOSIS — Z86718 Personal history of other venous thrombosis and embolism: Secondary | ICD-10-CM

## 2015-08-18 DIAGNOSIS — Z7901 Long term (current) use of anticoagulants: Secondary | ICD-10-CM

## 2015-08-18 LAB — POCT INR: INR: 1.6

## 2015-08-18 MED ORDER — WARFARIN SODIUM 5 MG PO TABS: ORAL_TABLET | ORAL | Status: DC

## 2015-08-18 NOTE — Patient Instructions (Signed)
Patient educated about medication as defined in this encounter and verbalized understanding by repeating back instructions provided.   

## 2015-08-18 NOTE — Progress Notes (Signed)
Anticoagulation Management Chelsea Mcdaniel is a 82 y.o. female who reports to the clinic for monitoring of warfarin treatment.    Indication: VTE history Duration: indefinite  Anticoagulation Clinic Visit History: Patient does not report signs/symptoms of bleeding or thromboembolism, patient does state she sometimes misses her Sunday doses because health aide does not come to home on Sunday.   Anticoagulation Episode Summary    Current INR goal 2.0-3.0  Next INR check 09/01/2015  INR from last check 1.6! (08/18/2015)  Weekly max dose   Target end date   INR check location   Preferred lab   Send INR reminders to    Indications  EMBOLISM/THROMBOSIS DEEP VSL LWR EXTRM NOS (Resolved) [I82.409] Long-term (current) use of anticoagulants [Z79.01]        Comments        ASSESSMENT Recent Results: Recent results are below, the most recent result is correlated with a dose of 32.5 mg per week due to nonadherence. Lab Results  Component Value Date   INR 1.6 08/18/2015   INR 2.10 06/09/2015   INR 3.0 05/19/2015   INR today: Subtherapeutic  Anticoagulation Dosing: INR as of 08/18/2015 and Previous Dosing Information    INR Dt INR Goal Chelsea Mcdaniel Sun Mon Tue Wed Thu Fri Sat   08/18/2015 1.6 - 32.5 mg 0 mg 5 mg 5 mg 7.5 mg 5 mg 5 mg 5 mg   Patient deviated from recommended dosing.       Anticoagulation Dose Instructions as of 08/18/2015      Total Sun Mon Tue Wed Thu Fri Sat   New Dose 37.5 mg 5 mg 5 mg 7.5 mg 5 mg 5 mg 5 mg 5 mg     (5 mg x 1)  (5 mg x 1)  (5 mg x 1.5)  (5 mg x 1)  (5 mg x 1)  (5 mg x 1)  (5 mg x 1)                         Description        No change for now, reinforced adherence      PLAN Educated patient on importance of adherence. Ask patient what plan would be for Sundays without aide coming to home to help. Patient states she will work with family to assist with her remembering to take her medications on Sundays.  Patient Instructions  Patient  educated about medication as defined in this encounter and verbalized understanding by repeating back instructions provided.   Patient advised to contact clinic or seek medical attention if signs/symptoms of bleeding or thromboembolism occur.  Follow-up Return in about 2 weeks (around 09/01/2015) for Follow up INR 09/01/15 at 2:30pm.  Chelsea Mcdaniel  15 minutes spent face-to-face with the patient during the encounter. 50% of time spent on education. 50% of time was spent on assessment and plan.

## 2015-08-19 ENCOUNTER — Telehealth: Payer: Self-pay | Admitting: Internal Medicine

## 2015-08-19 NOTE — Progress Notes (Addendum)
Indication: Previous DVT and CVAs while on plavix and ASA. Duration: As long as benefits outweigh risks. INR: Below target. Agree with Dr. Julianne Rice assessment and plan.

## 2015-08-19 NOTE — Telephone Encounter (Signed)
APPT FOR 08/20/15 REMINDER CALL, NO ANSWER,LMTCB

## 2015-08-20 ENCOUNTER — Encounter: Payer: Self-pay | Admitting: Internal Medicine

## 2015-08-20 ENCOUNTER — Ambulatory Visit (INDEPENDENT_AMBULATORY_CARE_PROVIDER_SITE_OTHER): Payer: Medicare Other | Admitting: Internal Medicine

## 2015-08-20 VITALS — BP 140/60 | HR 67 | Temp 97.4°F | Ht 62.0 in | Wt 155.9 lb

## 2015-08-20 DIAGNOSIS — Z7984 Long term (current) use of oral hypoglycemic drugs: Secondary | ICD-10-CM | POA: Diagnosis not present

## 2015-08-20 DIAGNOSIS — I10 Essential (primary) hypertension: Secondary | ICD-10-CM

## 2015-08-20 DIAGNOSIS — I129 Hypertensive chronic kidney disease with stage 1 through stage 4 chronic kidney disease, or unspecified chronic kidney disease: Secondary | ICD-10-CM | POA: Diagnosis not present

## 2015-08-20 DIAGNOSIS — Z86718 Personal history of other venous thrombosis and embolism: Secondary | ICD-10-CM

## 2015-08-20 DIAGNOSIS — E1122 Type 2 diabetes mellitus with diabetic chronic kidney disease: Secondary | ICD-10-CM | POA: Diagnosis not present

## 2015-08-20 DIAGNOSIS — Z7901 Long term (current) use of anticoagulants: Secondary | ICD-10-CM

## 2015-08-20 DIAGNOSIS — N183 Chronic kidney disease, stage 3 unspecified: Secondary | ICD-10-CM

## 2015-08-20 DIAGNOSIS — M8580 Other specified disorders of bone density and structure, unspecified site: Secondary | ICD-10-CM

## 2015-08-20 DIAGNOSIS — M859 Disorder of bone density and structure, unspecified: Secondary | ICD-10-CM

## 2015-08-20 DIAGNOSIS — Z79899 Other long term (current) drug therapy: Secondary | ICD-10-CM

## 2015-08-20 DIAGNOSIS — K5900 Constipation, unspecified: Secondary | ICD-10-CM

## 2015-08-20 DIAGNOSIS — E118 Type 2 diabetes mellitus with unspecified complications: Secondary | ICD-10-CM

## 2015-08-20 LAB — GLUCOSE, CAPILLARY: GLUCOSE-CAPILLARY: 205 mg/dL — AB (ref 65–99)

## 2015-08-20 MED ORDER — GLIPIZIDE ER 2.5 MG PO TB24: 2.5000 mg | ORAL_TABLET | Freq: Every day | ORAL | Status: DC

## 2015-08-20 NOTE — Patient Instructions (Signed)
1. I am restarting your glipizide to help with blood sugar control because your morning blood sugars are still high.  Please start glipizide 2.5mg  table every morning with breakfast.  This is a very low dose.  Please continue to check your blood sugar every morning before eating/drinking.  After your start glipizide, please check blood sugar again in the afternoon for at least the first week or two to make sure it is not dropping too low (less than 90).  Please call me if you blood sugars are less than 90 or if you feel unwell with the new medication.     2. Please take all medications as prescribed.    3. If you have worsening of your symptoms or new symptoms arise, please call the clinic [phone] or go to the ER immediately if symptoms are severe.   Please come back to see me in June 2017 or sooner if you have problems.

## 2015-08-20 NOTE — Assessment & Plan Note (Addendum)
BP Readings from Last 3 Encounters:  08/20/15 140/60  07/02/15 143/64  04/02/15 136/73    Lab Results  Component Value Date   NA 137 02/12/2015   K 3.9 02/12/2015   CREATININE 1.25* 02/12/2015    Assessment: Blood pressure control:  initial slight elevation, repeat improved to 140/60 Progress toward BP goal:   at goal Comments: Reports compliance with medications.  Her daughter-in-law fills her pill box.   Plan: Medications:  continue current medications:  Amlodipine 10mg  daily, Toprol XL 12.5mg  daily Educational resources provided: brochure (denies need) Self management tools provided:   Other plans:  RTC in June 2017 for follow-up.

## 2015-08-20 NOTE — Assessment & Plan Note (Addendum)
Lab Results  Component Value Date   HGBA1C 7.4 07/02/2015   HGBA1C 6.3 04/02/2015   HGBA1C 6.9 01/01/2015     Assessment: Diabetes control:  good Progress toward A1C goal:   at goal (<8) but previously very well controlled in 6s. Comments: She brought her logs and fasting CBGs are mostly 150s-250s.  She says she has cut back on sodas.  Drinking 1 every other day instead of 4-5 per day.    Plan: Medications:  continue current medications:  Januvia 25mg  daily.  RESUME glipizide at low dose, 2.5mg  daily. Home glucose monitoring: yes Frequency:  qAM (and qPM for first 1-2 weeks after starting glipizide ER) Timing:   Instruction/counseling given: reminded to bring blood glucose meter & log to each visit Educational resources provided: brochure (denies need) Self management tools provided: home glucose logbook Other plans:  Not aiming for extremely tight control given her age and co-morbidities.  However, she has high level of functioning and previous A1c were 6 so I would like to get a bit better control by adding low dose glipizide ER, 2.5mg .  She will call me if she starts to run low, which I don't think will occur as she was on glipizide 10mg  before without dropping too low.  She will follow-up with me in June 2017.

## 2015-08-20 NOTE — Progress Notes (Signed)
Subjective:    Patient ID: Chelsea Mcdaniel, female    DOB: 1927-10-31, 82 y.o.   MRN: [id number]  HPI Comments: [name] is an 82 year old woman with PMH as below here for follow-up of HTN and DM.  Please see problem based charting for status of her chronic conditions.     Past Medical History  Diagnosis Date  . Diabetes mellitus   . Hyperlipidemia   . Hypertension   . CVA (cerebrovascular accident) [facility])  October 2007     CT of the head atrophy with multiple remote insults noted but no definite acute findings  . Blindness of left eye      likely related to stroke, left cataract removed from that eye with complications  . Anemia      normocytic anemia with baseline hemoglobin 10-11  . Chronic kidney disease 2006     left renal artery stenosis with probable hemodynamic significance, kidneys are normal in morphology without focal lesions or hydronephrosis this is based but cannot A. of the abdomen with and without contrast done on the 31st 2006  . Glaucoma   . Personal history of DVT (deep vein thrombosis) 08/11/2010  . Calculus gallbladder and bile duct with cholecystitis with obstruction 01/2015   Current Outpatient Prescriptions on File Prior to Visit  Medication Sig Dispense Refill  . ACCU-CHEK AVIVA PLUS test strip Use 1 time daily to check blood sugar. 100 each 11  . ACCU-CHEK SOFTCLIX LANCETS lancets Use 1 time daily to check blood sugar. 100 each 3  . acetaminophen (TYLENOL) 325 MG tablet Take 2 tablets (650 mg total) by mouth every 6 (six) hours as needed.    Marland Kitchen amLODipine (NORVASC) 10 MG tablet Take 1 tablet (10 mg total) by mouth daily. For blood pressure 90 tablet 3  . Blood Glucose Monitoring Suppl (ACCU-CHEK AVIVA) device Use as instructed 1 each 0  . Calcium Carbonate-Vitamin D 600-400 MG-UNIT per tablet Take 1 tablet by mouth 2 (two) times daily.    . CRESTOR 5 MG tablet take 1 tablet by mouth at bedtime 90 tablet 1  . LUMIGAN 0.01 % SOLN Place 1 drop into both eyes  every morning.     . metoprolol succinate (TOPROL-XL) 25 MG 24 hr tablet TAKE ONE-HALF TABLET BY MOUTH EVERY DAY 45 tablet 3  . senna-docusate (SENOKOT-S) 8.6-50 MG per tablet Take 1 tablet by mouth at bedtime as needed for mild constipation. 30 tablet 2  . simethicone (GAS-X) 80 MG chewable tablet Chew 1 tablet (80 mg total) by mouth every 6 (six) hours as needed for flatulence. 30 tablet 0  . sitaGLIPtin (JANUVIA) 25 MG tablet Take 1 tablet (25 mg total) by mouth daily. 90 tablet 1  . warfarin (COUMADIN) 5 MG tablet Take 1 tablet daily--EXCEPT take 1.5 tablets (7.5 mg) on Tuesdays 30 tablet 3   No current facility-administered medications on file prior to visit.    Review of Systems  Constitutional: Negative for fever, chills, appetite change and unexpected weight change.  Eyes: Positive for visual disturbance.       Decreased vision due to right eye cataract.  She is not interested in surgery until absolutely necessary.  Respiratory: Negative for cough and shortness of breath.   Cardiovascular: Negative for chest pain, palpitations and leg swelling.  Gastrointestinal: Positive for constipation. Negative for nausea, vomiting, abdominal pain, diarrhea and blood in stool.       BM q3 days.  Genitourinary: Negative for dysuria, hematuria, vaginal  bleeding, vaginal discharge and difficulty urinating.  Neurological: Negative for syncope, light-headedness and headaches.  Hematological: Does not bruise/bleed easily.  Psychiatric/Behavioral: Negative for dysphoric mood. The patient is not nervous/anxious.        Filed Vitals:   08/20/15 1604 08/20/15 1702  BP: 159/72 140/60  Pulse: 67   Temp: 97.4 F (36.3 C)   TempSrc: Oral   Height: 5\' 2"  (1.575 m)   Weight: 155 lb 14.4 oz (70.716 kg)   SpO2: 100%     Objective:   Physical Exam  Constitutional: She is oriented to person, place, and time. She appears well-developed. No distress.  HENT:  Head: Normocephalic and atraumatic.    Mouth/Throat: Oropharynx is clear and moist. No oropharyngeal exudate.  Eyes: Conjunctivae and EOM are normal. Right eye exhibits no discharge. Left eye exhibits no discharge. No scleral icterus.  Left eye w/ post-op changes.  Neck: Neck supple.  Cardiovascular: Normal rate, regular rhythm and normal heart sounds.  Exam reveals no gallop and no friction rub.   No murmur heard. Pulmonary/Chest: Effort normal and breath sounds normal. No respiratory distress. She has no wheezes. She has no rales.  Abdominal: Soft. Bowel sounds are normal. She exhibits no distension. There is no tenderness. There is no rebound and no guarding.  Musculoskeletal: Normal range of motion. She exhibits no edema or tenderness.  Neurological: She is alert and oriented to person, place, and time. No cranial nerve deficit.  Skin: Skin is warm. She is not diaphoretic.  Psychiatric: She has a normal mood and affect. Her behavior is normal. Judgment and thought content normal.  Vitals reviewed.         Assessment & Plan:  Please see problem based charting for A&P

## 2015-08-21 LAB — VITAMIN D 25 HYDROXY (VIT D DEFICIENCY, FRACTURES): Vit D, 25-Hydroxy: 29.4 ng/mL — ABNORMAL LOW (ref 30.0–100.0)

## 2015-08-21 LAB — CMP14 + ANION GAP
ALBUMIN: 3.8 g/dL (ref 3.5–4.7)
ALK PHOS: 91 IU/L (ref 39–117)
ALT: 10 IU/L (ref 0–32)
AST: 24 IU/L (ref 0–40)
Albumin/Globulin Ratio: 1.3 (ref 1.1–2.5)
Anion Gap: 17 mmol/L (ref 10.0–18.0)
BILIRUBIN TOTAL: 0.3 mg/dL (ref 0.0–1.2)
BUN / CREAT RATIO: 12 (ref 11–26)
BUN: 21 mg/dL (ref 8–27)
CHLORIDE: 100 mmol/L (ref 96–106)
CO2: 21 mmol/L (ref 18–29)
CREATININE: 1.69 mg/dL — AB (ref 0.57–1.00)
Calcium: 9.1 mg/dL (ref 8.7–10.3)
GFR calc non Af Amer: 27 mL/min/{1.73_m2} — ABNORMAL LOW (ref >59)
GFR, EST AFRICAN AMERICAN: 31 mL/min/{1.73_m2} — AB (ref >59)
GLOBULIN, TOTAL: 2.9 g/dL (ref 1.5–4.5)
Glucose: 243 mg/dL — ABNORMAL HIGH (ref 65–99)
Potassium: 4.9 mmol/L (ref 3.5–5.2)
SODIUM: 138 mmol/L (ref 134–144)
TOTAL PROTEIN: 6.7 g/dL (ref 6.0–8.5)

## 2015-08-21 LAB — PHOSPHORUS: PHOSPHORUS: 3 mg/dL (ref 2.5–4.5)

## 2015-08-24 MED ORDER — SENNOSIDES-DOCUSATE SODIUM 8.6-50 MG PO TABS: 2.0000 | ORAL_TABLET | Freq: Every evening | ORAL | Status: DC | PRN

## 2015-08-24 NOTE — Assessment & Plan Note (Signed)
Assessment:  Reports BMs q3 days.  Plan:  Increase Senokot-S from 1 pill qHS to 2 pills qHS.

## 2015-08-24 NOTE — Assessment & Plan Note (Signed)
Assessment:  She continues on coumadin for prior hx of VTE.  She denies blood loss or easy bruising.  She has high level of functioning (BADLs), uses walker at home.  She tells me she has not fallen recently (in about 20 years per her report).  I think at this point risk of clot outweighs her risk of bleed and will continue A/C.   Plan:  Continue coumadin.  INR follow-up as scheduled in our coumadin clinic.

## 2015-08-24 NOTE — Assessment & Plan Note (Signed)
Assessment:  Last BMP was about 6 months ago.  She is on appropriate doses of meds (ie Januvia) for her GFR. Plan:  Recheck BMP today.  Continue to avoid nephrotoxins and control BP and DM.

## 2015-08-24 NOTE — Assessment & Plan Note (Signed)
Assessment:  No falls.  Compliant with vit D and calcium. Plan:  Will recheck BMP, phos, vit D today.  Continue vit D and calcium.

## 2015-08-25 NOTE — Progress Notes (Signed)
Case discussed with Dr. Wilson soon after the resident saw the patient. We reviewed the resident's history and exam and pertinent patient test results. I agree with the assessment, diagnosis, and plan of care documented in the resident's note. 

## 2015-08-27 DIAGNOSIS — M109 Gout, unspecified: Secondary | ICD-10-CM | POA: Diagnosis not present

## 2015-08-28 ENCOUNTER — Telehealth: Payer: Self-pay | Admitting: *Deleted

## 2015-08-28 ENCOUNTER — Other Ambulatory Visit: Payer: Self-pay | Admitting: Internal Medicine

## 2015-08-28 DIAGNOSIS — N189 Chronic kidney disease, unspecified: Secondary | ICD-10-CM

## 2015-08-28 DIAGNOSIS — R7989 Other specified abnormal findings of blood chemistry: Secondary | ICD-10-CM

## 2015-08-28 DIAGNOSIS — M109 Gout, unspecified: Secondary | ICD-10-CM | POA: Diagnosis not present

## 2015-08-28 DIAGNOSIS — N179 Acute kidney failure, unspecified: Secondary | ICD-10-CM

## 2015-08-28 NOTE — Telephone Encounter (Signed)
Call to patient to inform her of need for additional labs per order of Dr. Redmond Pulling.   Patient to have labs done when she comes in on Monday to see Dr. Elie Confer.  Sander Nephew, RN 08/28/2015 3:12 PM.

## 2015-08-29 DIAGNOSIS — M109 Gout, unspecified: Secondary | ICD-10-CM | POA: Diagnosis not present

## 2015-08-30 DIAGNOSIS — M109 Gout, unspecified: Secondary | ICD-10-CM | POA: Diagnosis not present

## 2015-09-01 ENCOUNTER — Ambulatory Visit (INDEPENDENT_AMBULATORY_CARE_PROVIDER_SITE_OTHER): Payer: Medicare Other | Admitting: Pharmacist

## 2015-09-01 ENCOUNTER — Other Ambulatory Visit (INDEPENDENT_AMBULATORY_CARE_PROVIDER_SITE_OTHER): Payer: Medicare Other

## 2015-09-01 DIAGNOSIS — Z86718 Personal history of other venous thrombosis and embolism: Secondary | ICD-10-CM

## 2015-09-01 DIAGNOSIS — Z7901 Long term (current) use of anticoagulants: Secondary | ICD-10-CM

## 2015-09-01 DIAGNOSIS — R748 Abnormal levels of other serum enzymes: Secondary | ICD-10-CM | POA: Diagnosis not present

## 2015-09-01 DIAGNOSIS — R7989 Other specified abnormal findings of blood chemistry: Secondary | ICD-10-CM

## 2015-09-01 DIAGNOSIS — M109 Gout, unspecified: Secondary | ICD-10-CM | POA: Diagnosis not present

## 2015-09-01 LAB — POCT INR: INR: 2

## 2015-09-01 NOTE — Progress Notes (Signed)
Anti-Coagulation Progress Note  Chelsea Mcdaniel is a 82 y.o. female who is currently on an anti-coagulation regimen.    RECENT RESULTS: Recent results are below, the most recent result is correlated with a dose of 37.5 mg. per week: Lab Results  Component Value Date   INR 2.0 09/01/2015   INR 1.6 08/18/2015   INR 2.10 06/09/2015    ANTI-COAG DOSE: Anticoagulation Dose Instructions as of 09/01/2015      [name] Tue Wed Thu Fri Sat   New Dose 5 mg 7.5 mg 5 mg 7.5 mg 5 mg 7.5 mg 5 mg       ANTICOAG SUMMARY: Anticoagulation Episode Summary    Current INR goal 2.0-3.0  Next INR check 09/22/2015  INR from last check 2.0 (09/01/2015)  Weekly max dose   Target end date   INR check location   Preferred lab   Send INR reminders to    Indications  EMBOLISM/THROMBOSIS DEEP VSL LWR EXTRM NOS (Resolved) [I82.409] Long-term (current) use of anticoagulants [Z79.01]        Comments         ANTICOAG TODAY: Anticoagulation Summary as of 09/01/2015    INR goal 2.0-3.0  Selected INR 2.0 (09/01/2015)  Next INR check 09/22/2015  Target end date    Indications  EMBOLISM/THROMBOSIS DEEP VSL LWR EXTRM NOS (Resolved) [I82.409] Long-term (current) use of anticoagulants [Z79.01]      Anticoagulation Episode Summary    INR check location    Preferred lab    Send INR reminders to    Comments       PATIENT INSTRUCTIONS: Patient Instructions  Patient instructed to take medications as defined in the Anti-coagulation Track section of this encounter.  Patient instructed to take today's dose.  Patient verbalized understanding of these instructions.       FOLLOW-UP Return in 3 weeks (on 09/22/2015) for Follow up INR at [place], III Pharm.D., CACP

## 2015-09-01 NOTE — Patient Instructions (Signed)
Patient instructed to take medications as defined in the Anti-coagulation Track section of this encounter.  Patient instructed to take today's dose.  Patient verbalized understanding of these instructions.    

## 2015-09-02 DIAGNOSIS — M109 Gout, unspecified: Secondary | ICD-10-CM | POA: Diagnosis not present

## 2015-09-02 LAB — BMP8+ANION GAP
Anion Gap: 18 mmol/L (ref 10.0–18.0)
BUN / CREAT RATIO: 13 (ref 11–26)
BUN: 23 mg/dL (ref 8–27)
CO2: 22 mmol/L (ref 18–29)
CREATININE: 1.8 mg/dL — AB (ref 0.57–1.00)
Calcium: 9 mg/dL (ref 8.7–10.3)
Chloride: 101 mmol/L (ref 96–106)
GFR, EST AFRICAN AMERICAN: 29 mL/min/{1.73_m2} — AB (ref >59)
GFR, EST NON AFRICAN AMERICAN: 25 mL/min/{1.73_m2} — AB (ref >59)
Glucose: 188 mg/dL — ABNORMAL HIGH (ref 65–99)
POTASSIUM: 4.7 mmol/L (ref 3.5–5.2)
Sodium: 141 mmol/L (ref 134–144)

## 2015-09-03 DIAGNOSIS — M109 Gout, unspecified: Secondary | ICD-10-CM | POA: Diagnosis not present

## 2015-09-03 NOTE — Progress Notes (Signed)
INTERNAL MEDICINE TEACHING ATTENDING ADDENDUM - Lalla Brothers M.D  Duration- indefinite, Indication- VTE, INR- therapeutic. Agree with pharmacy recommendations as outlined in their note.

## 2015-09-04 DIAGNOSIS — M109 Gout, unspecified: Secondary | ICD-10-CM | POA: Diagnosis not present

## 2015-09-05 DIAGNOSIS — M109 Gout, unspecified: Secondary | ICD-10-CM | POA: Diagnosis not present

## 2015-09-06 DIAGNOSIS — M109 Gout, unspecified: Secondary | ICD-10-CM | POA: Diagnosis not present

## 2015-09-08 DIAGNOSIS — M109 Gout, unspecified: Secondary | ICD-10-CM | POA: Diagnosis not present

## 2015-09-09 DIAGNOSIS — M109 Gout, unspecified: Secondary | ICD-10-CM | POA: Diagnosis not present

## 2015-09-10 ENCOUNTER — Telehealth: Payer: Self-pay

## 2015-09-10 ENCOUNTER — Other Ambulatory Visit: Payer: Self-pay | Admitting: Internal Medicine

## 2015-09-10 DIAGNOSIS — M109 Gout, unspecified: Secondary | ICD-10-CM | POA: Diagnosis not present

## 2015-09-10 DIAGNOSIS — N183 Chronic kidney disease, stage 3 (moderate): Secondary | ICD-10-CM

## 2015-09-10 DIAGNOSIS — E119 Type 2 diabetes mellitus without complications: Secondary | ICD-10-CM

## 2015-09-10 NOTE — Telephone Encounter (Signed)
Thank you.  Please ask patient to come in for visit next month for urine protein and repeat BMP.

## 2015-09-10 NOTE — Telephone Encounter (Signed)
Dr. Redmond Pulling, Gonzella Lex, NP with [facility] called to report to you that the urine dip resulted with trace glucose and 2+ protein, also pt has still been taking benazepril since last OV but NP has removed the remaining from pt pill box

## 2015-09-10 NOTE — Telephone Encounter (Signed)
msg sent to front desk

## 2015-09-11 DIAGNOSIS — M109 Gout, unspecified: Secondary | ICD-10-CM | POA: Diagnosis not present

## 2015-09-12 DIAGNOSIS — M109 Gout, unspecified: Secondary | ICD-10-CM | POA: Diagnosis not present

## 2015-09-13 DIAGNOSIS — M109 Gout, unspecified: Secondary | ICD-10-CM | POA: Diagnosis not present

## 2015-09-15 ENCOUNTER — Other Ambulatory Visit: Payer: Self-pay | Admitting: Internal Medicine

## 2015-09-15 DIAGNOSIS — M109 Gout, unspecified: Secondary | ICD-10-CM | POA: Diagnosis not present

## 2015-09-15 DIAGNOSIS — H401113 Primary open-angle glaucoma, right eye, severe stage: Secondary | ICD-10-CM | POA: Diagnosis not present

## 2015-09-15 DIAGNOSIS — H338 Other retinal detachments: Secondary | ICD-10-CM | POA: Diagnosis not present

## 2015-09-15 DIAGNOSIS — H2702 Aphakia, left eye: Secondary | ICD-10-CM | POA: Diagnosis not present

## 2015-09-15 DIAGNOSIS — H2511 Age-related nuclear cataract, right eye: Secondary | ICD-10-CM | POA: Diagnosis not present

## 2015-09-16 DIAGNOSIS — M109 Gout, unspecified: Secondary | ICD-10-CM | POA: Diagnosis not present

## 2015-09-17 ENCOUNTER — Ambulatory Visit (INDEPENDENT_AMBULATORY_CARE_PROVIDER_SITE_OTHER): Payer: Medicare Other | Admitting: Internal Medicine

## 2015-09-17 ENCOUNTER — Encounter: Payer: Self-pay | Admitting: Internal Medicine

## 2015-09-17 VITALS — BP 170/64 | HR 69 | Temp 97.8°F | Ht 62.0 in | Wt 156.6 lb

## 2015-09-17 DIAGNOSIS — I129 Hypertensive chronic kidney disease with stage 1 through stage 4 chronic kidney disease, or unspecified chronic kidney disease: Secondary | ICD-10-CM

## 2015-09-17 DIAGNOSIS — M1A3710 Chronic gout due to renal impairment, right ankle and foot, without tophus (tophi): Secondary | ICD-10-CM | POA: Diagnosis not present

## 2015-09-17 DIAGNOSIS — N183 Chronic kidney disease, stage 3 unspecified: Secondary | ICD-10-CM

## 2015-09-17 DIAGNOSIS — M109 Gout, unspecified: Secondary | ICD-10-CM | POA: Diagnosis not present

## 2015-09-17 DIAGNOSIS — E118 Type 2 diabetes mellitus with unspecified complications: Secondary | ICD-10-CM

## 2015-09-17 DIAGNOSIS — E1122 Type 2 diabetes mellitus with diabetic chronic kidney disease: Secondary | ICD-10-CM | POA: Diagnosis not present

## 2015-09-17 DIAGNOSIS — I10 Essential (primary) hypertension: Secondary | ICD-10-CM

## 2015-09-17 DIAGNOSIS — Z7984 Long term (current) use of oral hypoglycemic drugs: Secondary | ICD-10-CM

## 2015-09-17 LAB — GLUCOSE, CAPILLARY: GLUCOSE-CAPILLARY: 168 mg/dL — AB (ref 65–99)

## 2015-09-17 MED ORDER — FUROSEMIDE 20 MG PO TABS: 20.0000 mg | ORAL_TABLET | Freq: Two times a day (BID) | ORAL | Status: DC

## 2015-09-17 MED ORDER — COLCHICINE 0.6 MG PO TABS: 0.3000 mg | ORAL_TABLET | Freq: Every day | ORAL | Status: DC

## 2015-09-17 NOTE — Patient Instructions (Addendum)
1. Please make a follow up appointment for 1 week.   2. Please take all medications as previously prescribed with the following changes:  Start taking Lasix 20 mg twice daily for blood pressure.   Use Colchicine 0.3 mg (1/2 tablet) once daily until foot pain resolves.   3. If you have worsening of your symptoms or new symptoms arise, please call the clinic [phone]), or go to the ER immediately if symptoms are severe.  You have done a great job in taking all your medications. Please continue to do this.   Gout Gout is an inflammatory arthritis caused by a buildup of uric acid crystals in the joints. Uric acid is a chemical that is normally present in the blood. When the level of uric acid in the blood is too high it can form crystals that deposit in your joints and tissues. This causes joint redness, soreness, and swelling (inflammation). Repeat attacks are common. Over time, uric acid crystals can form into masses (tophi) near a joint, destroying bone and causing disfigurement. Gout is treatable and often preventable. CAUSES  The disease begins with elevated levels of uric acid in the blood. Uric acid is produced by your body when it breaks down a naturally found substance called purines. Certain foods you eat, such as meats and fish, contain high amounts of purines. Causes of an elevated uric acid level include:  Being passed down from parent to child (heredity).  Diseases that cause increased uric acid production (such as obesity, psoriasis, and certain cancers).  Excessive alcohol use.  Diet, especially diets rich in meat and seafood.  Medicines, including certain cancer-fighting medicines (chemotherapy), water pills (diuretics), and aspirin.  Chronic kidney disease. The kidneys are no longer able to remove uric acid well.  Problems with metabolism. Conditions strongly associated with gout include:  Obesity.  High blood pressure.  High cholesterol.  Diabetes. Not everyone  with elevated uric acid levels gets gout. It is not understood why some people get gout and others do not. Surgery, joint injury, and eating too much of certain foods are some of the factors that can lead to gout attacks. SYMPTOMS   An attack of gout comes on quickly. It causes intense pain with redness, swelling, and warmth in a joint.  Fever can occur.  Often, only one joint is involved. Certain joints are more commonly involved:  Base of the big toe.  Knee.  Ankle.  Wrist.  Finger. Without treatment, an attack usually goes away in a few days to weeks. Between attacks, you usually will not have symptoms, which is different from many other forms of arthritis. DIAGNOSIS  Your caregiver will suspect gout based on your symptoms and exam. In some cases, tests may be recommended. The tests may include:  Blood tests.  Urine tests.  X-rays.  Joint fluid exam. This exam requires a needle to remove fluid from the joint (arthrocentesis). Using a microscope, gout is confirmed when uric acid crystals are seen in the joint fluid. TREATMENT  There are two phases to gout treatment: treating the sudden onset (acute) attack and preventing attacks (prophylaxis).  Treatment of an Acute Attack.  Medicines are used. These include anti-inflammatory medicines or steroid medicines.  An injection of steroid medicine into the affected joint is sometimes necessary.  The painful joint is rested. Movement can worsen the arthritis.  You may use warm or cold treatments on painful joints, depending which works best for you.  Treatment to Prevent Attacks.  If you suffer  from frequent gout attacks, your caregiver may advise preventive medicine. These medicines are started after the acute attack subsides. These medicines either help your kidneys eliminate uric acid from your body or decrease your uric acid production. You may need to stay on these medicines for a very long time.  The early phase of  treatment with preventive medicine can be associated with an increase in acute gout attacks. For this reason, during the first few months of treatment, your caregiver may also advise you to take medicines usually used for acute gout treatment. Be sure you understand your caregiver's directions. Your caregiver may make several adjustments to your medicine dose before these medicines are effective.  Discuss dietary treatment with your caregiver or dietitian. Alcohol and drinks high in sugar and fructose and foods such as meat, poultry, and seafood can increase uric acid levels. Your caregiver or dietitian can advise you on drinks and foods that should be limited. HOME CARE INSTRUCTIONS   Do not take aspirin to relieve pain. This raises uric acid levels.  Only take over-the-counter or prescription medicines for pain, discomfort, or fever as directed by your caregiver.  Rest the joint as much as possible. When in bed, keep sheets and blankets off painful areas.  Keep the affected joint raised (elevated).  Apply warm or cold treatments to painful joints. Use of warm or cold treatments depends on which works best for you.  Use crutches if the painful joint is in your leg.  Drink enough fluids to keep your urine clear or pale yellow. This helps your body get rid of uric acid. Limit alcohol, sugary drinks, and fructose drinks.  Follow your dietary instructions. Pay careful attention to the amount of protein you eat. Your daily diet should emphasize fruits, vegetables, whole grains, and fat-free or low-fat milk products. Discuss the use of coffee, vitamin C, and cherries with your caregiver or dietitian. These may be helpful in lowering uric acid levels.  Maintain a healthy body weight. SEEK MEDICAL CARE IF:   You develop diarrhea, vomiting, or any side effects from medicines.  You do not feel better in 24 hours, or you are getting worse. SEEK IMMEDIATE MEDICAL CARE IF:   Your joint becomes  suddenly more tender, and you have chills or a fever. MAKE SURE YOU:   Understand these instructions.  Will watch your condition.  Will get help right away if you are not doing well or get worse.   This information is not intended to replace advice given to you by your health care provider. Make sure you discuss any questions you have with your health care provider.   Document Released: 07/09/2000 Document Revised: 08/02/2014 Document Reviewed: 02/23/2012 Elsevier Interactive Patient Education [employer].

## 2015-09-17 NOTE — Progress Notes (Signed)
Subjective:   Patient ID: Chelsea Mcdaniel female   DOB: 09-04-1927 82 y.o.   MRN: [id number]  HPI: Chelsea Mcdaniel is a 82 y.o. female w/ PMHx of DM type II, HTN, HLD, anemia, CKD, h/o CVA, and previous h/o DVT on Coumadin, presents to the clinic today for a follow-up visit regarding her blood pressure. Today her BP continues to be elevated, and looking back it has been at home as well. No longer taking Benazepril, which was a confusion at home. She is also having right foot pain and swelling that started yesterday. Quite painful and tender to the touch, had to wear different shoes today because of the swelling. Has had Gout before in the same foot, isolated to the great toe. No fevers, chills, nausea, or vomiting. Otherwise no complaints. Feels great otherwise. Very pleasant.    Current Outpatient Prescriptions  Medication Sig Dispense Refill  . ACCU-CHEK AVIVA PLUS test strip Use 1 time daily to check blood sugar. 100 each 11  . ACCU-CHEK SOFTCLIX LANCETS lancets Use 1 time daily to check blood sugar. 100 each 3  . acetaminophen (TYLENOL) 325 MG tablet Take 2 tablets (650 mg total) by mouth every 6 (six) hours as needed.    Marland Kitchen amLODipine (NORVASC) 10 MG tablet Take 1 tablet (10 mg total) by mouth daily. For blood pressure 90 tablet 3  . Blood Glucose Monitoring Suppl (ACCU-CHEK AVIVA) device Use as instructed 1 each 0  . Calcium Carbonate-Vitamin D 600-400 MG-UNIT per tablet Take 1 tablet by mouth 2 (two) times daily.    . colchicine 0.6 MG tablet Take 0.5 tablets (0.3 mg total) by mouth daily. 4 tablet 0  . CRESTOR 5 MG tablet take 1 tablet by mouth at bedtime 90 tablet 1  . furosemide (LASIX) 20 MG tablet Take 1 tablet (20 mg total) by mouth 2 (two) times daily. 60 tablet 2  . glipiZIDE (GLUCOTROL XL) 2.5 MG 24 hr tablet Take 1 tablet (2.5 mg total) by mouth daily with breakfast. 30 tablet 1  . LUMIGAN 0.01 % SOLN Place 1 drop into both eyes every morning.     . metoprolol succinate  (TOPROL-XL) 25 MG 24 hr tablet TAKE ONE-HALF TABLET BY MOUTH EVERY DAY 45 tablet 3  . senna-docusate (SENOKOT-S) 8.6-50 MG tablet Take 2 tablets by mouth at bedtime as needed for mild constipation. 60 tablet 2  . simethicone (GAS-X) 80 MG chewable tablet Chew 1 tablet (80 mg total) by mouth every 6 (six) hours as needed for flatulence. 30 tablet 0  . sitaGLIPtin (JANUVIA) 25 MG tablet Take 1 tablet (25 mg total) by mouth daily. 90 tablet 1  . warfarin (COUMADIN) 5 MG tablet Take 1 tablet daily--EXCEPT take 1.5 tablets (7.5 mg) on Tuesdays 30 tablet 3   No current facility-administered medications for this visit.    Review of Systems: General: Denies fever, chills, diaphoresis, appetite change and fatigue.  Respiratory: Denies SOB, DOE, cough, and wheezing.   Cardiovascular: Denies chest pain and palpitations.  Gastrointestinal: Denies nausea, vomiting, abdominal pain, and diarrhea.  Genitourinary: Denies dysuria, increased frequency, and flank pain. Endocrine: Denies hot or cold intolerance, polyuria, and polydipsia. Musculoskeletal: Positive for right foot pain and swelling. Denies myalgias, back pain, and gait problem.  Skin: Denies pallor, rash and wounds.  Neurological: Denies dizziness, seizures, syncope, weakness, lightheadedness, numbness and headaches.  Psychiatric/Behavioral: Denies mood changes, and sleep disturbances.  Objective:   Physical Exam: Filed Vitals:   09/17/15 1336 09/17/15 1345  BP: 176/71 170/64  Pulse: 69 69  Temp: 97.8 F (36.6 C)   TempSrc: Oral   Height: 5\' 2"  (1.575 m)   Weight: 156 lb 9.6 oz (71.033 kg)   SpO2: 99%     General: Elderly female, alert, cooperative, NAD. HEENT: PERRL, EOMI. Moist mucus membranes Neck: Full range of motion without pain, supple, no lymphadenopathy or carotid bruits Lungs: Clear to ascultation bilaterally, normal work of respiration, no wheezes, rales, rhonchi Heart: RRR, no murmurs, gallops, or rubs Abdomen: Soft,  non-tender, non-distended, BS + Extremities: No cyanosis, clubbing. Bilateral pitting edema isolated to the feet, significantly worse in the right foot/ankle than the left. Right ankle/dorsum of the foot tender to the touch, most significant over the medial ankle, erythematous.  Neurologic: Alert & oriented X3, cranial nerves II-XII intact, strength grossly intact, sensation intact to light touch   Assessment & Plan:   Please see problem based assessment and plan.

## 2015-09-18 DIAGNOSIS — M109 Gout, unspecified: Secondary | ICD-10-CM | POA: Diagnosis not present

## 2015-09-18 LAB — BMP8+ANION GAP
Anion Gap: 17 mmol/L (ref 10.0–18.0)
BUN / CREAT RATIO: 11 (ref 11–26)
BUN: 18 mg/dL (ref 8–27)
CHLORIDE: 100 mmol/L (ref 96–106)
CO2: 22 mmol/L (ref 18–29)
Calcium: 8.8 mg/dL (ref 8.7–10.3)
Creatinine, Ser: 1.59 mg/dL — ABNORMAL HIGH (ref 0.57–1.00)
GFR calc non Af Amer: 29 mL/min/{1.73_m2} — ABNORMAL LOW (ref >59)
GFR, EST AFRICAN AMERICAN: 34 mL/min/{1.73_m2} — AB (ref >59)
GLUCOSE: 167 mg/dL — AB (ref 65–99)
POTASSIUM: 4.3 mmol/L (ref 3.5–5.2)
Sodium: 139 mmol/L (ref 134–144)

## 2015-09-18 LAB — MICROALBUMIN / CREATININE URINE RATIO
Creatinine, Urine: 134 mg/dL
MICROALB/CREAT RATIO: 5185.4 mg/g creat — ABNORMAL HIGH (ref 0.0–30.0)
Microalbumin, Urine: 6948.4 ug/mL

## 2015-09-18 NOTE — Progress Notes (Signed)
Internal Medicine Clinic Attending  Case discussed with Dr. Jones soon after the resident saw the patient.  We reviewed the resident's history and exam and pertinent patient test results.  I agree with the assessment, diagnosis, and plan of care documented in the resident's note. 

## 2015-09-18 NOTE — Assessment & Plan Note (Signed)
Patient appears to have gout flare in her right foot. Foot is swollen, tender, red, mostly involving the medial ankle. No systemic symptoms.  -Given Colchicine 0.3 mg daily (adjusted for CrCl) for acute flare. Given #14 pills.  -RTC in 1 week to determine if flare has resolved.

## 2015-09-18 NOTE — Assessment & Plan Note (Addendum)
Checked BMP today, Cr improved slightly, now back towards baseline (~1.6). Trend as follows: BMP Latest Ref Rng 09/17/2015 09/01/2015 08/20/2015  Glucose 65 - 99 mg/dL 167(H) 188(H) 243(H)  BUN 8 - 27 mg/dL 18 23 21   Creatinine 0.57 - 1.00 mg/dL 1.59(H) 1.80(H) 1.69(H)  BUN/Creat Ratio 11 - 26 11 13 12   Sodium 134 - 144 mmol/L 139 141 138  Potassium 3.5 - 5.2 mmol/L 4.3 4.7 4.9  Chloride 96 - 106 mmol/L 100 101 100  CO2 18 - 29 mmol/L 22 22 21   Calcium 8.7 - 10.3 mg/dL 8.8 9.0 9.1  -Start Lasix 20 mg bid for improvement in blood pressure.  -Colchicine 0.3 mg daily for Gout -RTC in 1 week  ADDENDUM: Patient with nephrotic range proteinuria on recheck of urine microalbumin/cr. (5K). Will place referral for nephrology.

## 2015-09-18 NOTE — Assessment & Plan Note (Addendum)
Lab Results  Component Value Date   HGBA1C 7.4 07/02/2015   HGBA1C 6.3 04/02/2015   HGBA1C 6.9 01/01/2015     Assessment: Comments: Taking Januvia and Glipizide.   Plan: Medications:  continue current medications Other plans: Check Urine Microalbumin/Cr today. Not due for HbA1c until March.   ADDENDUM: Microalbumin/Cr ratio 5185 mg/g, significant for nephrotic range proteinuria. Needs nephrology referral at next visit if she is not seeing nephrologist already.

## 2015-09-18 NOTE — Assessment & Plan Note (Signed)
BP Readings from Last 3 Encounters:  09/17/15 170/64  08/20/15 140/60  07/02/15 143/64    Lab Results  Component Value Date   NA 139 09/17/2015   K 4.3 09/17/2015   CREATININE 1.59* 09/17/2015    Assessment: Blood pressure control:  Elevated.  Comments: Patient on Toprol-XL + Norvasc 10 mg daily. Recently taken off of her ACEI because of labile Cr in the setting of CKD stage 3/4. CrCl 25-30 based on previous BMP. Brought in blood pressure log, appears she has been elevated at home as well.   Plan: Medications: Continue Norvasc 10 mg daily + Toprol-XL 37.5 daily. Do not think increasing BB is going to get patient to goal. Given her CKD and low GFR, suspect some of her elevated BP is related to elevated intravascular volume. Will start Lasix 20 mg bid for BP for now, may require increase in dose at next visit.  Other plans: Rechecked Cr today, Improved from last visit. Follow up in 1 week to determine if BP has improved and Gout has resolved.

## 2015-09-19 DIAGNOSIS — M109 Gout, unspecified: Secondary | ICD-10-CM | POA: Diagnosis not present

## 2015-09-20 DIAGNOSIS — M109 Gout, unspecified: Secondary | ICD-10-CM | POA: Diagnosis not present

## 2015-09-22 ENCOUNTER — Ambulatory Visit: Payer: Self-pay

## 2015-09-23 NOTE — Addendum Note (Signed)
Addended byCorky Sox on: 09/23/2015 04:50 PM   Modules accepted: Orders

## 2015-09-24 ENCOUNTER — Ambulatory Visit: Payer: Self-pay | Admitting: Pharmacist

## 2015-09-24 ENCOUNTER — Encounter: Payer: Self-pay | Admitting: Internal Medicine

## 2015-09-24 ENCOUNTER — Ambulatory Visit (INDEPENDENT_AMBULATORY_CARE_PROVIDER_SITE_OTHER): Payer: Medicare Other | Admitting: Pharmacist

## 2015-09-24 ENCOUNTER — Ambulatory Visit (INDEPENDENT_AMBULATORY_CARE_PROVIDER_SITE_OTHER): Payer: Medicare Other | Admitting: Internal Medicine

## 2015-09-24 VITALS — BP 155/61 | HR 68 | Temp 97.9°F | Ht 62.0 in | Wt 153.9 lb

## 2015-09-24 DIAGNOSIS — Z7901 Long term (current) use of anticoagulants: Secondary | ICD-10-CM

## 2015-09-24 DIAGNOSIS — I10 Essential (primary) hypertension: Secondary | ICD-10-CM | POA: Diagnosis not present

## 2015-09-24 DIAGNOSIS — E0800 Diabetes mellitus due to underlying condition with hyperosmolarity without nonketotic hyperglycemic-hyperosmolar coma (NKHHC): Secondary | ICD-10-CM

## 2015-09-24 DIAGNOSIS — E1122 Type 2 diabetes mellitus with diabetic chronic kidney disease: Secondary | ICD-10-CM | POA: Diagnosis not present

## 2015-09-24 DIAGNOSIS — Z86718 Personal history of other venous thrombosis and embolism: Secondary | ICD-10-CM | POA: Diagnosis not present

## 2015-09-24 DIAGNOSIS — N183 Chronic kidney disease, stage 3 unspecified: Secondary | ICD-10-CM

## 2015-09-24 DIAGNOSIS — I129 Hypertensive chronic kidney disease with stage 1 through stage 4 chronic kidney disease, or unspecified chronic kidney disease: Secondary | ICD-10-CM | POA: Diagnosis not present

## 2015-09-24 DIAGNOSIS — M1A3710 Chronic gout due to renal impairment, right ankle and foot, without tophus (tophi): Secondary | ICD-10-CM

## 2015-09-24 DIAGNOSIS — M1A39X0 Chronic gout due to renal impairment, multiple sites, without tophus (tophi): Secondary | ICD-10-CM | POA: Diagnosis not present

## 2015-09-24 LAB — POCT GLYCOSYLATED HEMOGLOBIN (HGB A1C): HEMOGLOBIN A1C: 7

## 2015-09-24 LAB — GLUCOSE, CAPILLARY: Glucose-Capillary: 164 mg/dL — ABNORMAL HIGH (ref 65–99)

## 2015-09-24 LAB — POCT INR: INR: 3.7

## 2015-09-24 MED ORDER — VALSARTAN 40 MG PO TABS: 40.0000 mg | ORAL_TABLET | Freq: Every day | ORAL | Status: DC

## 2015-09-24 MED ORDER — COLCHICINE 0.6 MG PO TABS: 0.3000 mg | ORAL_TABLET | Freq: Every day | ORAL | Status: DC

## 2015-09-24 MED ORDER — DILTIAZEM HCL ER 60 MG PO CP12: 60.0000 mg | ORAL_CAPSULE | Freq: Two times a day (BID) | ORAL | Status: DC

## 2015-09-24 MED ORDER — ALLOPURINOL 100 MG PO TABS: 100.0000 mg | ORAL_TABLET | Freq: Every day | ORAL | Status: DC

## 2015-09-24 NOTE — Assessment & Plan Note (Signed)
Assessment: Her presumptive gout flare resolved since her last visit. She's never had an arthrocentesis to confirm the diagnosis; I wonder if this may be pseudogout given her age and involvement of the ankles and wrists. However, she has hyperuricemia so I suspect this is likely gout. Regardless, she has had 3-4 episodes of gout flares per year so I think allopurinol is a good option for her. I've checked her uric acid level today and started her on allopurinol 100mg  as well as colchicine 0.3mg  daily to prevent a gout flare. She'll need to continue on the colchicine until September 2017, at which point we can continue pulling it. She was also on Crestor for primary preventionof hyperlipidemia but this, in conjuction with colchicine, puts her at high risk of myopathy, so I've stopped it today given her age.  Plan: -I'll check a uric acid level today; we'll check again in 2 weeks and up-titrate allopurinol 100mg  at a time for a goal uric acid of less than 6. -We'll continue colchicine 0.3mg  daily until September 2017; we may need to renally-titrate this should her kidney function worsen -Started valstartan for her proteinuria and hypertension which should have a uricosuric effect -Should she have another flare, I think an arthrocentesis would be prudent to assess for pseudogout

## 2015-09-24 NOTE — Assessment & Plan Note (Signed)
Assessment:  Her home blood pressure readings have been in the 180s over the last week. Given her newly-diagnosed proteinuria, I've stopped her amlodipine and switched her to a nodal-CCB, diltiazem. Her pulse is in the 70s today so we'll need to re-check her pressure and rate at the next visit. Also, given her proteinuria and stably elevated creatinine, I've started losartan, as ARBs have a uricosuric effect and may also help her gout. She has a standing referral to nephrology so I'm hoping she will be able to see them soon.  Plan: -Stopped amlodipine -Started losartan 50mg  daily -Started diltiazem 60mg  twice daily -Checked BMP today -Will need blood-pressure follow-up in 2 weeks -If her pressures remain elevated, we can go up on the ARB to assist with the proteinuria

## 2015-09-24 NOTE — Progress Notes (Signed)
Anticoagulation Management Chelsea Mcdaniel is a 82 y.o. female who reports to the clinic for monitoring of warfarin treatment.    Indication: DVT Duration: indefinite  Anticoagulation Clinic Visit History: Patient does not report signs/symptoms of bleeding or thromboembolism  Anticoagulation Episode Summary    Current INR goal 2.0-3.0  Next INR check 10/06/2015  INR from last check 3.7! (09/24/2015)  Weekly max dose   Target end date   INR check location   Preferred lab   Send INR reminders to    Indications  EMBOLISM/THROMBOSIS DEEP VSL LWR EXTRM NOS (Resolved) [I82.409] Long-term (current) use of anticoagulants [Z79.01]        Comments        ASSESSMENT Recent Results: The most recent result is correlated with 42.5 mg per week: Lab Results  Component Value Date   INR 3.7 09/24/2015   INR 2.0 09/01/2015   INR 1.6 08/18/2015    Anticoagulation Dosing: INR as of 09/24/2015 and Previous Dosing Information    INR Dt INR Goal [employer] Sun Mon Tue Wed Thu Fri Sat   09/24/2015 3.7 2.0-3.0 42.5 mg 5 mg 7.5 mg 5 mg 7.5 mg 5 mg 7.5 mg 5 mg    Anticoagulation Dose Instructions as of 09/24/2015      Total Sun Mon Tue Wed Thu Fri Sat   New Dose 40 mg 5 mg 7.5 mg 5 mg 5 mg 5 mg 7.5 mg 5 mg     (5 mg x 1)  (5 mg x 1.5)  (5 mg x 1)  (5 mg x 1)  (5 mg x 1)  (5 mg x 1.5)  (5 mg x 1)                           INR today: Supratherapeutic  PLAN Weekly dose was decreased by 6% to 40 mg per week  Patient Instructions  Handout provided through [employer]  Patient advised to contact clinic or seek medical attention if signs/symptoms of bleeding or thromboembolism occur.  Patient verbalized understanding by repeating back information and was advised to contact me if further medication-related questions arise. Patient was also provided an information handout.  Follow-up Return in about 12 days (around 10/06/2015) for Follow up INR 10/06/2015 at [place],  PharmD. Clinical Pharmacy Resident  10 minutes spent face-to-face with the patient during the encounter.

## 2015-09-24 NOTE — Patient Instructions (Signed)
Handout provided through [facility]

## 2015-09-24 NOTE — Patient Instructions (Addendum)
Ms. Calafiore,  I'm glad to hear your gout has gotten so much better!  We've started you on a medicine called allopurinol to help prevent gout attacks. We'll need to check your Uric Acid level today and also in 2 weeks so we can figure out how much allopurinol you should take. Continue taking the colchicine 0.3mg  daily as well.  We've also stopped some medications. Your amlodipine can worsen your kidney function so we've stopped it. The Crestor, along with colchicine, can worsen bad muscle pains, so we've stopped that as well.  Instead, we've added diltiazem and losartan; these can actually help your kidney function, your high blood pressure, and your gout.  Take care, and we'll see you in 2 weeks, Dr. Melburn Hake

## 2015-09-24 NOTE — Progress Notes (Signed)
Patient ID: Chelsea Mcdaniel, female   DOB: 02-24-1928, 82 y.o.   MRN: [id number] [facility] Subjective:   Patient ID: Chelsea Mcdaniel female   DOB: Nov 09, 1927 82 y.o.   MRN: [id number]  HPI: [name] is a 82 y.o. female with a relevant history of non-insulin-dependent type 2 diabetes, hypertension, unprovoked DVT on indefinite warfarin, chronic kidney disease stage III, and presumptive gout presenting for follow-up of gout flare and proteuria.  At the last visit, Dr. Ronnald Ramp prescribed her colchicine 0.3mg  daily.  Since then, her right ankle pain has completely resolved. She tells me she gets 3-4 gout attacks he year, and her ankles and wrists.. She has never been on allopurinol in the past.  I have reviewed her medications and she is not smoking.  Please see the assessment and plan for the status of the patient's chronic medical problems.  Review of Systems  Constitutional: Negative for fever, chills and weight loss.  Respiratory: Negative for shortness of breath.   Cardiovascular: Negative for palpitations and leg swelling.  Musculoskeletal: Positive for joint pain. Negative for myalgias.   Objective:  Physical Exam: Filed Vitals:   09/24/15 1457  BP: 155/61  Pulse: 68  Temp: 97.9 F (36.6 C)  TempSrc: Oral  Weight: 153 lb 14.4 oz (69.809 kg)  SpO2: 99%   General: resting in chair comfortably, appropriately conversational Cardiac: regular rate and rhythm, no rubs, murmurs or gallops Pulm: breathing well, clear to auscultation bilaterally Ext: right ankle with 2+ pitting edema but it is neither hot nor red, and is non-tender to passive motion. Bilateral hands appear normal without synovitis or tophi.  Assessment & Plan:  Case discussed with Dr. Lynnae January  Essential hypertension Assessment:  Her home blood pressure readings have been in the 180s over the last week. Given her newly-diagnosed proteinuria, I've stopped her amlodipine and switched her  to a nodal-CCB, diltiazem. Her pulse is in the 70s today so we'll need to re-check her pressure and rate at the next visit. Also, given her proteinuria and stably elevated creatinine, I've started losartan, as ARBs have a uricosuric effect and may also help her gout. She has a standing referral to nephrology so I'm hoping she will be able to see them soon.  Plan: -Stopped amlodipine -Started losartan 50mg  daily -Started diltiazem 60mg  twice daily -Checked BMP today -Will need blood-pressure follow-up in 2 weeks -If her pressures remain elevated, we can go up on the ARB to assist with the proteinuria  Chronic gout due to renal impairment without tophus Assessment: Her presumptive gout flare resolved since her last visit. She's never had an arthrocentesis to confirm the diagnosis; I wonder if this may be pseudogout given her age and involvement of the ankles and wrists. However, she has hyperuricemia so I suspect this is likely gout. Regardless, she has had 3-4 episodes of gout flares per year so I think allopurinol is a good option for her. I've checked her uric acid level today and started her on allopurinol 100mg  as well as colchicine 0.3mg  daily to prevent a gout flare. She'll need to continue on the colchicine until September 2017, at which point we can continue pulling it. She was also on Crestor for primary preventionof hyperlipidemia but this, in conjuction with colchicine, puts her at high risk of myopathy, so I've stopped it today given her age.  Plan: -I'll check a uric acid level today; we'll check again in 2 weeks and up-titrate allopurinol 100mg  at a  time for a goal uric acid of less than 6. -We'll continue colchicine 0.3mg  daily until September 2017; we may need to renally-titrate this should her kidney function worsen -Started valstartan for her proteinuria and hypertension which should have a uricosuric effect -Should she have another flare, I think an arthrocentesis would be prudent  to assess for pseudogout   Medications Ordered Meds ordered this encounter  Medications  . diltiazem (CARDIZEM SR) 60 MG 12 hr capsule    Sig: Take 1 capsule (60 mg total) by mouth 2 (two) times daily.    Dispense:  60 capsule    Refill:  11  . colchicine 0.6 MG tablet    Sig: Take 0.5 tablets (0.3 mg total) by mouth daily.    Dispense:  30 tablet    Refill:  3  . allopurinol (ZYLOPRIM) 100 MG tablet    Sig: Take 1 tablet (100 mg total) by mouth daily.    Dispense:  90 tablet    Refill:  1  . valsartan (DIOVAN) 40 MG tablet    Sig: Take 1 tablet (40 mg total) by mouth daily.    Dispense:  30 tablet    Refill:  11   Other Orders Orders Placed This Encounter  Procedures  . Uric acid  . BMP8+Anion Gap  . POCT HgB A1C (CPT [phone])   Follow Up: Return in about 2 weeks (around 10/08/2015).

## 2015-09-25 LAB — BMP8+ANION GAP
Anion Gap: 18 mmol/L (ref 10.0–18.0)
BUN/Creatinine Ratio: 13 (ref 11–26)
BUN: 19 mg/dL (ref 8–27)
CO2: 24 mmol/L (ref 18–29)
Calcium: 8.9 mg/dL (ref 8.7–10.3)
Chloride: 99 mmol/L (ref 96–106)
Creatinine, Ser: 1.48 mg/dL — ABNORMAL HIGH (ref 0.57–1.00)
GFR calc Af Amer: 37 mL/min/{1.73_m2} — ABNORMAL LOW (ref >59)
GFR, EST NON AFRICAN AMERICAN: 32 mL/min/{1.73_m2} — AB (ref >59)
Glucose: 164 mg/dL — ABNORMAL HIGH (ref 65–99)
Potassium: 4.7 mmol/L (ref 3.5–5.2)
SODIUM: 141 mmol/L (ref 134–144)

## 2015-09-25 LAB — URIC ACID: Uric Acid: 6.7 mg/dL (ref 2.5–7.1)

## 2015-09-26 ENCOUNTER — Telehealth: Payer: Self-pay | Admitting: Internal Medicine

## 2015-09-26 NOTE — Telephone Encounter (Signed)
Requesting the nurse to call back regarding warfarin.

## 2015-09-26 NOTE — Telephone Encounter (Signed)
Spoke w/ daugh in law, went over med changes

## 2015-09-26 NOTE — Progress Notes (Signed)
Internal Medicine Clinic Attending  Case discussed with Dr. Flores at the time of the visit.  We reviewed the resident's history and exam and pertinent patient test results.  I agree with the assessment, diagnosis, and plan of care documented in the resident's note. 

## 2015-09-29 ENCOUNTER — Telehealth: Payer: Self-pay | Admitting: Internal Medicine

## 2015-09-29 DIAGNOSIS — M109 Gout, unspecified: Secondary | ICD-10-CM | POA: Diagnosis not present

## 2015-09-29 NOTE — Telephone Encounter (Signed)
Pt is confused on her medication. On her paperwork it states losartan was going to be started but valsartan was called in to the pharmacy.

## 2015-09-30 ENCOUNTER — Other Ambulatory Visit: Payer: Self-pay | Admitting: Internal Medicine

## 2015-09-30 DIAGNOSIS — M1A30X0 Chronic gout due to renal impairment, unspecified site, without tophus (tophi): Secondary | ICD-10-CM

## 2015-09-30 DIAGNOSIS — M109 Gout, unspecified: Secondary | ICD-10-CM | POA: Diagnosis not present

## 2015-09-30 DIAGNOSIS — N183 Chronic kidney disease, stage 3 unspecified: Secondary | ICD-10-CM

## 2015-09-30 MED ORDER — LOSARTAN POTASSIUM 25 MG PO TABS: 25.0000 mg | ORAL_TABLET | Freq: Every day | ORAL | Status: DC

## 2015-09-30 NOTE — Telephone Encounter (Signed)
Dr. Melburn Hake, did you intend to start Losartan 50mg  as well?  Looks like it from office note but it didn't get ordered.

## 2015-10-01 DIAGNOSIS — M109 Gout, unspecified: Secondary | ICD-10-CM | POA: Diagnosis not present

## 2015-10-02 DIAGNOSIS — M109 Gout, unspecified: Secondary | ICD-10-CM | POA: Diagnosis not present

## 2015-10-03 DIAGNOSIS — M109 Gout, unspecified: Secondary | ICD-10-CM | POA: Diagnosis not present

## 2015-10-04 DIAGNOSIS — M109 Gout, unspecified: Secondary | ICD-10-CM | POA: Diagnosis not present

## 2015-10-06 ENCOUNTER — Ambulatory Visit (INDEPENDENT_AMBULATORY_CARE_PROVIDER_SITE_OTHER): Payer: Medicare Other | Admitting: Internal Medicine

## 2015-10-06 ENCOUNTER — Ambulatory Visit (INDEPENDENT_AMBULATORY_CARE_PROVIDER_SITE_OTHER): Payer: Medicare Other | Admitting: Pharmacist

## 2015-10-06 ENCOUNTER — Encounter: Payer: Self-pay | Admitting: Internal Medicine

## 2015-10-06 VITALS — BP 136/72 | HR 67 | Temp 98.0°F | Ht 62.0 in | Wt 149.7 lb

## 2015-10-06 DIAGNOSIS — Z86718 Personal history of other venous thrombosis and embolism: Secondary | ICD-10-CM

## 2015-10-06 DIAGNOSIS — N183 Chronic kidney disease, stage 3 unspecified: Secondary | ICD-10-CM

## 2015-10-06 DIAGNOSIS — Z7901 Long term (current) use of anticoagulants: Secondary | ICD-10-CM | POA: Diagnosis not present

## 2015-10-06 DIAGNOSIS — I129 Hypertensive chronic kidney disease with stage 1 through stage 4 chronic kidney disease, or unspecified chronic kidney disease: Secondary | ICD-10-CM | POA: Diagnosis not present

## 2015-10-06 DIAGNOSIS — I10 Essential (primary) hypertension: Secondary | ICD-10-CM

## 2015-10-06 DIAGNOSIS — M1A30X0 Chronic gout due to renal impairment, unspecified site, without tophus (tophi): Secondary | ICD-10-CM | POA: Diagnosis not present

## 2015-10-06 DIAGNOSIS — M1A3310 Chronic gout due to renal impairment, right wrist, without tophus (tophi): Secondary | ICD-10-CM | POA: Diagnosis not present

## 2015-10-06 LAB — GLUCOSE, CAPILLARY: Glucose-Capillary: 162 mg/dL — ABNORMAL HIGH (ref 65–99)

## 2015-10-06 LAB — POCT INR: INR: 4.2

## 2015-10-06 MED ORDER — LOSARTAN POTASSIUM 50 MG PO TABS: 50.0000 mg | ORAL_TABLET | Freq: Every day | ORAL | Status: DC

## 2015-10-06 MED ORDER — LOSARTAN POTASSIUM 25 MG PO TABS: 25.0000 mg | ORAL_TABLET | Freq: Every day | ORAL | Status: DC

## 2015-10-06 NOTE — Patient Instructions (Signed)
Patient instructed to take medications as defined in the Anti-coagulation Track section of this encounter.  Patient instructed to omit tomorrow's dose.  Patient verbalized understanding of these instructions.

## 2015-10-06 NOTE — Progress Notes (Signed)
Patient ID: Chelsea Mcdaniel, female   DOB: 25-Aug-1927, 82 y.o.   MRN: [id number] [facility] Subjective:   Patient ID: Chelsea Mcdaniel female   DOB: 1928/07/07 82 y.o.   MRN: [id number]  HPI: [name] is a very friendly 82 y.o. female with non-insulin-dependent type 2 diabetes, unprovoked deep venous thrombosis on warfarin indefinitely, chronic kidney disease stage III, hypertension, and presumptive gout presenting to clinic for follow-up of gout and hypertension.  Gout: She has been compliant with allopurinol 100mg  and colchicine 0.3mg  daily without any side effects such as diarrhea or rash. She has not had a gout attack since starting allopurinol two weeks ago.  Hypertension: Her home pressures have been elevated in the 150-170s/80s-90s. She's been compliant with all of her medications and denies any side effects.  She is not a smoker and I have reviewed her medications with her today.  Please see the assessment and plan for the status of the patient's chronic medical problems.  Review of Systems  Constitutional: Negative for fever, chills, weight loss and malaise/fatigue.  Respiratory: Negative for cough and shortness of breath.   Cardiovascular: Positive for leg swelling. Negative for chest pain.  Musculoskeletal: Negative for joint pain.  Skin: Negative for rash.  Neurological: Negative for headaches.  Psychiatric/Behavioral: Negative for depression.   Objective:  Physical Exam: Filed Vitals:   10/06/15 1440 10/06/15 1542  BP: 190/94 136/72  Pulse: 75 67  Temp: 98 F (36.7 C)   TempSrc: Oral   Height: 5\' 2"  (1.575 m)   Weight: 149 lb 11.2 oz (67.903 kg)   SpO2: 100%    General: very friendly lady resting in chair comfortably, appropriately conversational Cardiac: regular rate and rhythm, no rubs, murmurs or gallops Pulm: breathing well, clear to auscultation bilaterally Abd: bowel sounds normal, soft, nondistended, non-tender Ext: warm and  well perfused, right foot with 1+ pitting edema, left foot without edema Lymph: no cervical or supraclavicular lymphadenopathy Skin: no tophi on hands or elbows Neuro: alert and oriented X3, cranial nerves II-XII grossly intact, moving all extremities well   Assessment & Plan:  Case discussed with Dr. Lynnae January  Chronic gout due to renal impairment without tophus With hopes of preventing future gout attacks, two weeks ago we started allopurinol 100mg  daily along with renally-dosed colchicine 0.3mg  daily. She's tolerated the medicine well and denies any diarrhea or rash. Her uric acid at that time was 6.7. I'm checking another uric acid level today and will titrate the allopurinol accordingly. I've also stopped her furosemide because she appears euvolemic; I'm hoping this will help prevent future gout attacks as well. I think her right lower extremity edema is from post-phlebitis syndrome.  Essential hypertension Today she was normotensive at 140/80. I've stopped her furosemide because she appears euvolemic and I fear this may trigger another gout attack. She'll continue losartan 25mg  daily and diltiazem 60mg  daily. Should she be hypertensive in the future, I think we should up-titrate the losartan for its renally-protective and uricosuric benefits.   Medications Ordered Meds ordered this encounter  Medications  . DISCONTD: losartan (COZAAR) 50 MG tablet    Sig: Take 1 tablet (50 mg total) by mouth daily.    Dispense:  30 tablet    Refill:  11  . losartan (COZAAR) 25 MG tablet    Sig: Take 1 tablet (25 mg total) by mouth daily.    Dispense:  30 tablet    Refill:  11   Other Orders Orders  Placed This Encounter  Procedures  . Uric acid  . BMP8+Anion Gap  . CBC no Diff   Follow Up: Return in about 2 weeks (around 10/20/2015).

## 2015-10-06 NOTE — Patient Instructions (Addendum)
Chelsea Mcdaniel,  It was great to see you today!  I'm glad to hear you haven't had another gout attack.  As I explained, the allopurinol helps lower the crystal level to prevent gout attacks. During the first 3-6 months, the allopurinol can actually increase the risk of another attack before it starts to prevent them, so you also have to take the colchicine to prevent a gout attack for the beginning of taking the allopurinol.  I've checked your crystal level today and we can increase the allopurinol more if we need to.  Your blood pressure looked great today so we can stop the furosemide. Keep taking the other medications and I'll re-check it in 2 weeks.  I've also stopped the furosemide because this can worsen gout and I don't think you need it.  Take care and we'll see you back in 2 weeks, Dr. Melburn Hake

## 2015-10-06 NOTE — Assessment & Plan Note (Signed)
Today she was normotensive at 140/80. I've stopped her furosemide because she appears euvolemic and I fear this may trigger another gout attack. She'll continue losartan 25mg  daily and diltiazem 60mg  daily. Should she be hypertensive in the future, I think we should up-titrate the losartan for its renally-protective and uricosuric benefits.

## 2015-10-06 NOTE — Progress Notes (Signed)
Anti-Coagulation Progress Note  Denielle SHATERICA KUSAK is a 82 y.o. female who is currently on an anti-coagulation regimen.    RECENT RESULTS: Recent results are below, the most recent result is correlated with a dose of 40 mg. per week: Lab Results  Component Value Date   INR 4.2 10/06/2015   INR 3.7 09/24/2015   INR 2.0 09/01/2015    ANTI-COAG DOSE: Anticoagulation Dose Instructions as of 10/06/2015      [name] Tue Wed Thu Fri Sat   New Dose 5 mg 5 mg 5 mg 5 mg 5 mg 5 mg 5 mg       ANTICOAG SUMMARY: Anticoagulation Episode Summary    Current INR goal 2.0-3.0  Next INR check 10/27/2015  INR from last check 4.2! (10/06/2015)  Weekly max dose   Target end date   INR check location   Preferred lab   Send INR reminders to    Indications  EMBOLISM/THROMBOSIS DEEP VSL LWR EXTRM NOS (Resolved) [I82.409] Long-term (current) use of anticoagulants [Z79.01]        Comments         ANTICOAG TODAY: Anticoagulation Summary as of 10/06/2015    INR goal 2.0-3.0  Selected INR 4.2! (10/06/2015)  Next INR check 10/27/2015  Target end date    Indications  EMBOLISM/THROMBOSIS DEEP VSL LWR EXTRM NOS (Resolved) [I82.409] Long-term (current) use of anticoagulants [Z79.01]      Anticoagulation Episode Summary    INR check location    Preferred lab    Send INR reminders to    Comments       PATIENT INSTRUCTIONS: Patient Instructions  Patient instructed to take medications as defined in the Anti-coagulation Track section of this encounter.  Patient instructed to omit tomorrow's dose.  Patient verbalized understanding of these instructions.       FOLLOW-UP Return in about 3 weeks (around 10/27/2015) for f/u INR at 3:00.  Darl Pikes, PharmD Clinical Pharmacist- Resident Pager: [phone]  [name], III Pharm.D., CACP

## 2015-10-06 NOTE — Assessment & Plan Note (Signed)
With hopes of preventing future gout attacks, two weeks ago we started allopurinol 100mg  daily along with renally-dosed colchicine 0.3mg  daily. She's tolerated the medicine well and denies any diarrhea or rash. Her uric acid at that time was 6.7. I'm checking another uric acid level today and will titrate the allopurinol accordingly. I've also stopped her furosemide because she appears euvolemic; I'm hoping this will help prevent future gout attacks as well. I think her right lower extremity edema is from post-phlebitis syndrome.

## 2015-10-07 ENCOUNTER — Telehealth: Payer: Self-pay | Admitting: Internal Medicine

## 2015-10-07 DIAGNOSIS — M1A3710 Chronic gout due to renal impairment, right ankle and foot, without tophus (tophi): Secondary | ICD-10-CM

## 2015-10-07 DIAGNOSIS — M1A30X0 Chronic gout due to renal impairment, unspecified site, without tophus (tophi): Secondary | ICD-10-CM

## 2015-10-07 LAB — URIC ACID: URIC ACID: 6.1 mg/dL (ref 2.5–7.1)

## 2015-10-07 LAB — BMP8+ANION GAP
Anion Gap: 18 mmol/L (ref 10.0–18.0)
BUN/Creatinine Ratio: 22 (ref 11–26)
BUN: 33 mg/dL — ABNORMAL HIGH (ref 8–27)
CALCIUM: 9 mg/dL (ref 8.7–10.3)
CO2: 23 mmol/L (ref 18–29)
CREATININE: 1.53 mg/dL — AB (ref 0.57–1.00)
Chloride: 103 mmol/L (ref 96–106)
GFR, EST AFRICAN AMERICAN: 35 mL/min/{1.73_m2} — AB (ref >59)
GFR, EST NON AFRICAN AMERICAN: 31 mL/min/{1.73_m2} — AB (ref >59)
Glucose: 155 mg/dL — ABNORMAL HIGH (ref 65–99)
Potassium: 4.2 mmol/L (ref 3.5–5.2)
Sodium: 144 mmol/L (ref 134–144)

## 2015-10-07 LAB — CBC
HEMOGLOBIN: 10.6 g/dL — AB (ref 11.1–15.9)
Hematocrit: 33.9 % — ABNORMAL LOW (ref 34.0–46.6)
MCH: 29.3 pg (ref 26.6–33.0)
MCHC: 31.3 g/dL — AB (ref 31.5–35.7)
MCV: 94 fL (ref 79–97)
Platelets: 323 10*3/uL (ref 150–379)
RBC: 3.62 x10E6/uL — ABNORMAL LOW (ref 3.77–5.28)
RDW: 13.8 % (ref 12.3–15.4)
WBC: 5.2 10*3/uL (ref 3.4–10.8)

## 2015-10-07 MED ORDER — ALLOPURINOL 100 MG PO TABS: 150.0000 mg | ORAL_TABLET | Freq: Every day | ORAL | Status: DC

## 2015-10-07 NOTE — Progress Notes (Signed)
Internal Medicine Clinic Attending  Case discussed with Dr. Flores at the time of the visit.  We reviewed the resident's history and exam and pertinent patient test results.  I agree with the assessment, diagnosis, and plan of care documented in the resident's note. 

## 2015-10-07 NOTE — Telephone Encounter (Signed)
Patient requesting a DME ORDER for a Raised toliet seat as well as Diabetic Shoes.

## 2015-10-07 NOTE — Telephone Encounter (Signed)
PATIENT'S DAUGHTER, JOANNA WOULD LIKE YOU TO CALL HER [phone], Jazzma'S DOCTOR CALLED HER TODAY ABOUT CHANGING HER MEDS BUT SHE DID UNDERSTAND WHAT HE WAS SAYING.

## 2015-10-07 NOTE — Telephone Encounter (Signed)
Spoke with Ms. Razon's daughter, she has been given direction to take 1.5 tablets Allopurinol daily until the follow-up appointment on 3/27, directions read back and clear.

## 2015-10-07 NOTE — Telephone Encounter (Signed)
Chelsea Mcdaniel uric acid level was just a touch elevated at 6.1 so I've increased her allopurinol dose from 100 to 150mg  before we check another uric acid level in 2 weels. When I called her to let her know, she told me this was like "pouring water over a duck's back," and would have her daughter-in-law, Chelsea Mcdaniel, call.  If she calls, please let her know to simply take one and a half allopurinol tablets daily until we see her in 2 weeks.  Loleta Chance

## 2015-10-07 NOTE — Telephone Encounter (Signed)
Left message with female to have [place] call me back.

## 2015-10-09 ENCOUNTER — Other Ambulatory Visit: Payer: Self-pay | Admitting: Internal Medicine

## 2015-10-09 DIAGNOSIS — Z8673 Personal history of transient ischemic attack (TIA), and cerebral infarction without residual deficits: Secondary | ICD-10-CM

## 2015-10-09 NOTE — Telephone Encounter (Signed)
I placed DME order for raised toilet seat.  Diabetic shoes are a little tougher to obtain.  Does she have paperwork to complete for the shoes?

## 2015-10-17 ENCOUNTER — Telehealth: Payer: Self-pay | Admitting: Internal Medicine

## 2015-10-17 NOTE — Telephone Encounter (Signed)
APPT. REMINDER CALL, NO ANSWER, NO VOICE MAIL °

## 2015-10-20 ENCOUNTER — Ambulatory Visit (INDEPENDENT_AMBULATORY_CARE_PROVIDER_SITE_OTHER): Payer: Medicare Other | Admitting: Pharmacist

## 2015-10-20 ENCOUNTER — Ambulatory Visit (INDEPENDENT_AMBULATORY_CARE_PROVIDER_SITE_OTHER): Payer: Medicare Other | Admitting: Internal Medicine

## 2015-10-20 VITALS — BP 144/78 | HR 72 | Temp 97.4°F | Wt 150.2 lb

## 2015-10-20 DIAGNOSIS — E1122 Type 2 diabetes mellitus with diabetic chronic kidney disease: Secondary | ICD-10-CM | POA: Diagnosis not present

## 2015-10-20 DIAGNOSIS — I10 Essential (primary) hypertension: Secondary | ICD-10-CM

## 2015-10-20 DIAGNOSIS — R5382 Chronic fatigue, unspecified: Secondary | ICD-10-CM | POA: Diagnosis not present

## 2015-10-20 DIAGNOSIS — Z7901 Long term (current) use of anticoagulants: Secondary | ICD-10-CM

## 2015-10-20 DIAGNOSIS — N183 Chronic kidney disease, stage 3 unspecified: Secondary | ICD-10-CM

## 2015-10-20 DIAGNOSIS — Z79899 Other long term (current) drug therapy: Secondary | ICD-10-CM

## 2015-10-20 DIAGNOSIS — I129 Hypertensive chronic kidney disease with stage 1 through stage 4 chronic kidney disease, or unspecified chronic kidney disease: Secondary | ICD-10-CM

## 2015-10-20 DIAGNOSIS — Z86718 Personal history of other venous thrombosis and embolism: Secondary | ICD-10-CM | POA: Diagnosis not present

## 2015-10-20 DIAGNOSIS — R5383 Other fatigue: Secondary | ICD-10-CM | POA: Insufficient documentation

## 2015-10-20 DIAGNOSIS — R0989 Other specified symptoms and signs involving the circulatory and respiratory systems: Secondary | ICD-10-CM

## 2015-10-20 DIAGNOSIS — E0800 Diabetes mellitus due to underlying condition with hyperosmolarity without nonketotic hyperglycemic-hyperosmolar coma (NKHHC): Secondary | ICD-10-CM

## 2015-10-20 DIAGNOSIS — Z7984 Long term (current) use of oral hypoglycemic drugs: Secondary | ICD-10-CM

## 2015-10-20 DIAGNOSIS — M1A30X0 Chronic gout due to renal impairment, unspecified site, without tophus (tophi): Secondary | ICD-10-CM

## 2015-10-20 LAB — POCT INR: INR: 2.9

## 2015-10-20 MED ORDER — LOSARTAN POTASSIUM 50 MG PO TABS: 50.0000 mg | ORAL_TABLET | Freq: Every day | ORAL | Status: DC

## 2015-10-20 NOTE — Patient Instructions (Signed)
1. Please schedule a follow up appointment for 4 weeks.   2. Please take all medications as previously prescribed with the following changes:  Increase Losartan to 50 mg daily. I have sent this prescription to your pharmacy.   3. If you have worsening of your symptoms or new symptoms arise, please call the clinic [phone]), or go to the ER immediately if symptoms are severe.

## 2015-10-20 NOTE — Patient Instructions (Signed)
Patient instructed to take medications as defined in the Anti-coagulation Track section of this encounter.  Patient instructed to take today's dose.  Patient verbalized understanding of these instructions.    

## 2015-10-20 NOTE — Progress Notes (Signed)
Subjective:   Patient ID: Chelsea Mcdaniel female   DOB: 1927/10/06 82 y.o.   MRN: [id number]  HPI: [name] is a 82 y.o. female w/ PMHx of DM type II, HTN, HLD, anemia, CKD, h/o CVA, and previous h/o DVT on Coumadin, presents to the clinic today for a follow-up visit regarding her blood pressure and renal function. Patient says she is doing well today but is feeling tired. Recently had some of her BP medications changed and feels like she is doing well with these recent changes. No issues with Gout currently.    Current Outpatient Prescriptions  Medication Sig Dispense Refill  . ACCU-CHEK AVIVA PLUS test strip Use 1 time daily to check blood sugar. 100 each 11  . ACCU-CHEK SOFTCLIX LANCETS lancets Use 1 time daily to check blood sugar. 100 each 3  . acetaminophen (TYLENOL) 325 MG tablet Take 2 tablets (650 mg total) by mouth every 6 (six) hours as needed.    Marland Kitchen allopurinol (ZYLOPRIM) 100 MG tablet Take 1.5 tablets (150 mg total) by mouth daily. 90 tablet 3  . Blood Glucose Monitoring Suppl (ACCU-CHEK AVIVA) device Use as instructed 1 each 0  . Calcium Carbonate-Vitamin D 600-400 MG-UNIT per tablet Take 1 tablet by mouth 2 (two) times daily.    . colchicine 0.6 MG tablet Take 0.5 tablets (0.3 mg total) by mouth daily. 30 tablet 3  . diltiazem (CARDIZEM SR) 60 MG 12 hr capsule Take 1 capsule (60 mg total) by mouth 2 (two) times daily. 60 capsule 11  . glipiZIDE (GLUCOTROL XL) 2.5 MG 24 hr tablet Take 1 tablet (2.5 mg total) by mouth daily with breakfast. 30 tablet 1  . losartan (COZAAR) 25 MG tablet Take 1 tablet (25 mg total) by mouth daily. 30 tablet 11  . LUMIGAN 0.01 % SOLN Place 1 drop into both eyes every morning.     . metoprolol succinate (TOPROL-XL) 25 MG 24 hr tablet TAKE ONE-HALF TABLET BY MOUTH EVERY DAY 45 tablet 3  . senna-docusate (SENOKOT-S) 8.6-50 MG tablet Take 2 tablets by mouth at bedtime as needed for mild constipation. 60 tablet 2  . simethicone (GAS-X) 80 MG  chewable tablet Chew 1 tablet (80 mg total) by mouth every 6 (six) hours as needed for flatulence. 30 tablet 0  . sitaGLIPtin (JANUVIA) 25 MG tablet Take 1 tablet (25 mg total) by mouth daily. 90 tablet 1  . warfarin (COUMADIN) 5 MG tablet Take 1 tablet daily--EXCEPT take 1.5 tablets (7.5 mg) on Tuesdays 30 tablet 3   No current facility-administered medications for this visit.    Review of Systems  General: Positive for fatigue. Denies fever, diaphoresis, appetite change.  Respiratory: Denies SOB, cough, and wheezing.   Cardiovascular: Denies chest pain and palpitations.  Gastrointestinal: Denies nausea, vomiting, abdominal pain, and diarrhea Musculoskeletal: Denies myalgias, arthralgias, back pain, and gait problem.  Neurological: Denies dizziness, syncope, weakness, lightheadedness, and headaches.  Psychiatric/Behavioral: Denies mood changes, sleep disturbance, and agitation.   Objective:   Physical Exam: Filed Vitals:   10/20/15 1508  BP: 144/84  Pulse: 72  Temp: 97.4 F (36.3 C)  TempSrc: Oral  Weight: 150 lb 3.2 oz (68.13 kg)  SpO2: 123XX123   Initial systolic BP discrepancy in arms (R>L), however, not present when checked with a manual cuff.   General: Elderly female, alert, cooperative, NAD. HEENT: PERRL, EOMI. Moist mucus membranes Neck: Full range of motion without pain, supple, no lymphadenopathy or carotid bruits Lungs: Clear to ascultation  bilaterally, normal work of respiration, no wheezes, rales, rhonchi Heart: RRR, no murmurs, gallops, or rubs Abdomen: Soft, non-tender, non-distended, BS + Extremities: No cyanosis, clubbing. Bilateral pitting edema isolated to the feet, significantly worse in the right foot/ankle than the left. Right ankle/dorsum of the foot tender to the touch, most significant over the medial ankle, erythematous.  Neurologic: Alert & oriented X3, cranial nerves II-XII intact, strength grossly intact, sensation intact to light  touch   Assessment & Plan:   Please see problem based assessment and plan.

## 2015-10-20 NOTE — Progress Notes (Signed)
Anti-Coagulation Progress Note  Chelsea Mcdaniel is a 82 y.o. female who is currently on an anti-coagulation regimen.    RECENT RESULTS: Recent results are below, the most recent result is correlated with a dose of 35 mg. per week: Lab Results  Component Value Date   INR 2.90 10/20/2015   INR 4.2 10/06/2015   INR 3.7 09/24/2015    ANTI-COAG DOSE: Anticoagulation Dose Instructions as of 10/20/2015      [name] Tue Wed Thu Fri Sat   New Dose 5 mg 2.5 mg 5 mg 5 mg 2.5 mg 5 mg 5 mg       ANTICOAG SUMMARY: Anticoagulation Episode Summary    Current INR goal 2.0-3.0  Next INR check 11/17/2015  INR from last check 2.90 (10/20/2015)  Weekly max dose   Target end date   INR check location   Preferred lab   Send INR reminders to    Indications  EMBOLISM/THROMBOSIS DEEP VSL LWR EXTRM NOS (Resolved) [I82.409] Long-term (current) use of anticoagulants [Z79.01]        Comments         ANTICOAG TODAY: Anticoagulation Summary as of 10/20/2015    INR goal 2.0-3.0  Selected INR 2.90 (10/20/2015)  Next INR check 11/17/2015  Target end date    Indications  EMBOLISM/THROMBOSIS DEEP VSL LWR EXTRM NOS (Resolved) [I82.409] Long-term (current) use of anticoagulants [Z79.01]      Anticoagulation Episode Summary    INR check location    Preferred lab    Send INR reminders to    Comments       PATIENT INSTRUCTIONS: Patient Instructions  Patient instructed to take medications as defined in the Anti-coagulation Track section of this encounter.  Patient instructed to take today's dose.  Patient verbalized understanding of these instructions.       FOLLOW-UP Return in 4 weeks (on 11/17/2015) for Follow up INR while seeing PCP .  [name], III Pharm.D., CACP

## 2015-10-21 ENCOUNTER — Telehealth: Payer: Self-pay | Admitting: Internal Medicine

## 2015-10-21 DIAGNOSIS — N183 Chronic kidney disease, stage 3 (moderate): Secondary | ICD-10-CM | POA: Diagnosis not present

## 2015-10-21 DIAGNOSIS — E0800 Diabetes mellitus due to underlying condition with hyperosmolarity without nonketotic hyperglycemic-hyperosmolar coma (NKHHC): Secondary | ICD-10-CM | POA: Diagnosis not present

## 2015-10-21 NOTE — Assessment & Plan Note (Signed)
Lab Results  Component Value Date   HGBA1C 7.0 09/24/2015   HGBA1C 7.4 07/02/2015   HGBA1C 6.3 04/02/2015     Assessment: Diabetes control:  Well controlled Progress toward A1C goal:   At goal Comments: Taking Glipizide 2.5 mg daily and Januvia 25 mg daily  Plan: Medications:  continue current medications Instruction/counseling given: reminded to bring medications to each visit, discussed foot care and discussed diet Other plans: RC in 4 weeks

## 2015-10-21 NOTE — Assessment & Plan Note (Signed)
Stable. No current gout flare. Taking Colchicine 0.3 mg daily + Allopurinol 150 mg daily. Cr stable, continue doses as listed. Most recent uric acid 6.1.

## 2015-10-21 NOTE — Assessment & Plan Note (Signed)
BP initially elevated to 99991111 systolic, but with significant discrepancy in UE's. After repeat with manual cuff, systolic [id number]. Currently taking Toprol-XL 12.5 mg once daily, Cozaar 25 mg daily, and Cardizem SR 60 mg bid. BMP checked, Cr stable.  -Increase Losartan to 50 mg daily -RTC in 4 weeks

## 2015-10-21 NOTE — Assessment & Plan Note (Signed)
Significant discrepancy in SBP in UE's initially with machine cuff (>30 mm Hg). Rechecked with manual cuff, no discrepancy. If this is truly an issue in the future, unlikely to require further management given Chelsea Mcdaniel is currently on Coumadin and unlikely to be a surgical candidate if there is stenosis, etc. given her CKD and chronic illnesses. Have discussed this possible discrepancy with her. No current symptoms to suggest dissection, etc. Pulses intact bilaterally.  -Recheck cuff pressure in arms bilaterally at next clinic visit

## 2015-10-21 NOTE — Telephone Encounter (Signed)
Pt's request for a Elevated Commode seat faxed on 10/09/2015 and again on 10/20/2015 to [facility].  Also contacted the pt to let her be aware order has been faxed.  If patient has any further questions please have her to contact [facility] @ [phone] and press the prompt for DME Supplies or she can speak to a Representative directly about delivery.

## 2015-10-21 NOTE — Assessment & Plan Note (Signed)
Cr stable 

## 2015-10-21 NOTE — Assessment & Plan Note (Signed)
Patient admits to daily fatigue. Most likely related to polypharmacy vs anemia of chronic disease/CKD. Checked Vit B12, normal, TSH pending. -RTC in 4 weeks.

## 2015-10-22 ENCOUNTER — Other Ambulatory Visit: Payer: Self-pay | Admitting: Internal Medicine

## 2015-10-22 LAB — BMP8+ANION GAP
ANION GAP: 18 mmol/L (ref 10.0–18.0)
BUN/Creatinine Ratio: 13 (ref 11–26)
BUN: 19 mg/dL (ref 8–27)
CALCIUM: 9.1 mg/dL (ref 8.7–10.3)
CHLORIDE: 103 mmol/L (ref 96–106)
CO2: 21 mmol/L (ref 18–29)
Creatinine, Ser: 1.48 mg/dL — ABNORMAL HIGH (ref 0.57–1.00)
GFR, EST AFRICAN AMERICAN: 37 mL/min/{1.73_m2} — AB (ref >59)
GFR, EST NON AFRICAN AMERICAN: 32 mL/min/{1.73_m2} — AB (ref >59)
GLUCOSE: 141 mg/dL — AB (ref 65–99)
POTASSIUM: 4.4 mmol/L (ref 3.5–5.2)
SODIUM: 142 mmol/L (ref 134–144)

## 2015-10-22 LAB — VITAMIN B12: VITAMIN B 12: 713 pg/mL (ref 211–946)

## 2015-10-22 LAB — TSH: TSH: 3.79 u[IU]/mL (ref 0.450–4.500)

## 2015-10-22 NOTE — Progress Notes (Signed)
I have reviewed Dr. Gladstone Pih note.  Patient is on [facility] for VTE.  INR at high end of goal and coumadin decreased.

## 2015-10-22 NOTE — Telephone Encounter (Signed)
Called, lm for rtc 

## 2015-10-22 NOTE — Progress Notes (Signed)
Internal Medicine Clinic Attending  Case discussed with Dr. Jones at the time of the visit.  We reviewed the resident's history and exam and pertinent patient test results.  I agree with the assessment, diagnosis, and plan of care documented in the resident's note.  

## 2015-10-22 NOTE — Telephone Encounter (Signed)
Pt daughter in law requesting the nurse to call back regarding Lasix.

## 2015-10-23 ENCOUNTER — Telehealth: Payer: Self-pay | Admitting: Internal Medicine

## 2015-10-23 NOTE — Telephone Encounter (Signed)
DAUGHTER IN LAW [phone]  ,[place] HAS QUESTION ABOUT MED FOR [place]

## 2015-10-23 NOTE — Telephone Encounter (Signed)
Ask about furosemide, d'cd 3/13, explained

## 2015-10-24 ENCOUNTER — Other Ambulatory Visit: Payer: Self-pay | Admitting: Student in an Organized Health Care Education/Training Program

## 2015-10-24 NOTE — Telephone Encounter (Signed)
Last appt 10/20/15 saw Dr Ronnald Ramp and Dr Elie Confer.

## 2015-10-27 ENCOUNTER — Ambulatory Visit: Payer: Self-pay

## 2015-10-27 DIAGNOSIS — H2511 Age-related nuclear cataract, right eye: Secondary | ICD-10-CM | POA: Diagnosis not present

## 2015-10-27 DIAGNOSIS — H2702 Aphakia, left eye: Secondary | ICD-10-CM | POA: Diagnosis not present

## 2015-10-27 DIAGNOSIS — H401113 Primary open-angle glaucoma, right eye, severe stage: Secondary | ICD-10-CM | POA: Diagnosis not present

## 2015-10-27 DIAGNOSIS — H338 Other retinal detachments: Secondary | ICD-10-CM | POA: Diagnosis not present

## 2015-11-03 ENCOUNTER — Other Ambulatory Visit: Payer: Self-pay | Admitting: Internal Medicine

## 2015-11-17 ENCOUNTER — Ambulatory Visit (INDEPENDENT_AMBULATORY_CARE_PROVIDER_SITE_OTHER): Payer: Medicare Other | Admitting: Pharmacist

## 2015-11-17 ENCOUNTER — Encounter: Payer: Self-pay | Admitting: Internal Medicine

## 2015-11-17 ENCOUNTER — Ambulatory Visit (INDEPENDENT_AMBULATORY_CARE_PROVIDER_SITE_OTHER): Payer: Medicare Other | Admitting: Internal Medicine

## 2015-11-17 ENCOUNTER — Ambulatory Visit (HOSPITAL_COMMUNITY)
Admission: RE | Admit: 2015-11-17 | Discharge: 2015-11-17 | Disposition: A | Discharge status: Home / Self Care | Payer: Medicare Other | Source: Ambulatory Visit | Attending: Internal Medicine | Admitting: Internal Medicine

## 2015-11-17 VITALS — BP 207/84 | HR 65 | Ht 62.0 in | Wt 148.1 lb

## 2015-11-17 DIAGNOSIS — Z7901 Long term (current) use of anticoagulants: Secondary | ICD-10-CM

## 2015-11-17 DIAGNOSIS — I10 Essential (primary) hypertension: Secondary | ICD-10-CM | POA: Diagnosis not present

## 2015-11-17 DIAGNOSIS — R0989 Other specified symptoms and signs involving the circulatory and respiratory systems: Secondary | ICD-10-CM | POA: Diagnosis not present

## 2015-11-17 DIAGNOSIS — Z86718 Personal history of other venous thrombosis and embolism: Secondary | ICD-10-CM

## 2015-11-17 DIAGNOSIS — R030 Elevated blood-pressure reading, without diagnosis of hypertension: Secondary | ICD-10-CM | POA: Diagnosis not present

## 2015-11-17 LAB — POCT INR: INR: 2

## 2015-11-17 LAB — GLUCOSE, CAPILLARY: GLUCOSE-CAPILLARY: 235 mg/dL — AB (ref 65–99)

## 2015-11-17 MED ORDER — LOSARTAN POTASSIUM 100 MG PO TABS: 100.0000 mg | ORAL_TABLET | Freq: Every day | ORAL | Status: DC

## 2015-11-17 NOTE — Progress Notes (Signed)
Anti-Coagulation Progress Note  Chelsea Mcdaniel is a 82 y.o. female who is currently on an anti-coagulation regimen.    RECENT RESULTS: Recent results are below, the most recent result is correlated with a dose of 30 mg. per week: Lab Results  Component Value Date   INR 2.0 11/17/2015   INR 2.90 10/20/2015   INR 4.2 10/06/2015    ANTI-COAG DOSE: Anticoagulation Dose Instructions as of 11/17/2015      [name] Tue Wed Thu Fri Sat   New Dose 5 mg 2.5 mg 5 mg 5 mg 5 mg 5 mg 5 mg       ANTICOAG SUMMARY: Anticoagulation Episode Summary    Current INR goal 2.0-3.0  Next INR check 12/08/2015  INR from last check 2.0 (11/17/2015)  Weekly max dose   Target end date   INR check location   Preferred lab   Send INR reminders to    Indications  EMBOLISM/THROMBOSIS DEEP VSL LWR EXTRM NOS (Resolved) [I82.409] Long-term (current) use of anticoagulants [Z79.01]        Comments         ANTICOAG TODAY: Anticoagulation Summary as of 11/17/2015    INR goal 2.0-3.0  Selected INR 2.0 (11/17/2015)  Next INR check 12/08/2015  Target end date    Indications  EMBOLISM/THROMBOSIS DEEP VSL LWR EXTRM NOS (Resolved) [I82.409] Long-term (current) use of anticoagulants [Z79.01]      Anticoagulation Episode Summary    INR check location    Preferred lab    Send INR reminders to    Comments       PATIENT INSTRUCTIONS: Patient Instructions  Patient instructed to take medications as defined in the Anti-coagulation Track section of this encounter.  Patient instructed to take today's dose.  Patient verbalized understanding of these instructions.       FOLLOW-UP Return in 3 weeks (on 12/08/2015) for Follow up INR at [place], III Pharm.D., CACP

## 2015-11-17 NOTE — Progress Notes (Signed)
Subjective:   Patient ID: Chelsea Mcdaniel female   DOB: June 17, 1928 82 y.o.   MRN: [id number]  HPI: [name] is a 82 y.o. with past medical history as outlined below who presents to clinic for BP follow up.   Please see problem list for status of the pt's chronic medical problems.  Past Medical History  Diagnosis Date  . Diabetes mellitus   . Hyperlipidemia   . Hypertension   . CVA (cerebrovascular accident) [facility])  October 2007     CT of the head atrophy with multiple remote insults noted but no definite acute findings  . Blindness of left eye      likely related to stroke, left cataract removed from that eye with complications  . Anemia      normocytic anemia with baseline hemoglobin 10-11  . Chronic kidney disease 2006     left renal artery stenosis with probable hemodynamic significance, kidneys are normal in morphology without focal lesions or hydronephrosis this is based but cannot A. of the abdomen with and without contrast done on the 31st 2006  . Glaucoma   . Personal history of DVT (deep vein thrombosis) 08/11/2010  . Calculus gallbladder and bile duct with cholecystitis with obstruction 01/2015   Current Outpatient Prescriptions  Medication Sig Dispense Refill  . ACCU-CHEK AVIVA PLUS test strip Use 1 time daily to check blood sugar. 100 each 11  . ACCU-CHEK SOFTCLIX LANCETS lancets Use 1 time daily to check blood sugar. 100 each 3  . acetaminophen (TYLENOL) 325 MG tablet Take 2 tablets (650 mg total) by mouth every 6 (six) hours as needed.    Marland Kitchen allopurinol (ZYLOPRIM) 100 MG tablet Take 1.5 tablets (150 mg total) by mouth daily. 90 tablet 3  . Blood Glucose Monitoring Suppl (ACCU-CHEK AVIVA) device Use as instructed 1 each 0  . Calcium Carbonate-Vitamin D 600-400 MG-UNIT per tablet Take 1 tablet by mouth 2 (two) times daily.    . colchicine 0.6 MG tablet Take 0.5 tablets (0.3 mg total) by mouth daily. 30 tablet 3  . diltiazem (CARDIZEM SR) 60 MG 12 hr capsule Take  1 capsule (60 mg total) by mouth 2 (two) times daily. 60 capsule 11  . glipiZIDE (GLUCOTROL XL) 2.5 MG 24 hr tablet Take 1 tablet (2.5 mg total) by mouth daily with breakfast. 30 tablet 2  . losartan (COZAAR) 50 MG tablet Take 1 tablet (50 mg total) by mouth daily. 30 tablet 5  . LUMIGAN 0.01 % SOLN Place 1 drop into both eyes every morning.     . metoprolol succinate (TOPROL-XL) 25 MG 24 hr tablet TAKE ONE-HALF TABLET BY MOUTH EVERY DAY 45 tablet 3  . senna-docusate (SENOKOT-S) 8.6-50 MG tablet Take 2 tablets by mouth at bedtime as needed for mild constipation. 60 tablet 2  . simethicone (GAS-X) 80 MG chewable tablet Chew 1 tablet (80 mg total) by mouth every 6 (six) hours as needed for flatulence. 30 tablet 0  . sitaGLIPtin (JANUVIA) 25 MG tablet Take 1 tablet (25 mg total) by mouth daily. 90 tablet 1  . warfarin (COUMADIN) 5 MG tablet Take one pill daily except take one half pill (2.5mg ) on Mondays and Thursdays 30 tablet 3   No current facility-administered medications for this visit.   Family History  Problem Relation Age of Onset  . Heart disease Mother   . Diabetes Mother   . Hyperlipidemia Mother   . Hypertension Mother   . Heart disease Father   .  Diabetes Father   . Hyperlipidemia Father   . Hypertension Father    Social History   Social History  . Marital Status: Widowed    Spouse Name: N/A  . Number of Children: N/A  . Years of Education: N/A   Social History Main Topics  . Smoking status: Never Smoker   . Smokeless tobacco: Never Used  . Alcohol Use: No  . Drug Use: No  . Sexual Activity: No   Other Topics Concern  . Not on file   Social History Narrative    Patient is a widow. She has 11 children 5 of who are living. She is retired in 1993 from [employer]. She denies tobacco alcohol or drug use.   Review of Systems: Review of Systems  Constitutional: Negative for fever, chills, weight loss and malaise/fatigue.  Eyes: Negative for blurred vision.    Respiratory: Negative for shortness of breath.   Cardiovascular: Negative for chest pain.  Gastrointestinal: Negative for abdominal pain.  Genitourinary: Negative for frequency.  Musculoskeletal: Negative for back pain.    Objective:  Physical Exam: Filed Vitals:   11/17/15 1336  BP: 236/96  Pulse: 69  Height: 5\' 2"  (1.575 m)  Weight: 148 lb 1.6 oz (67.178 kg)  SpO2: 100%   Physical Exam  Constitutional: She appears well-developed and well-nourished. No distress.  HENT:  Head: Normocephalic and atraumatic.  Nose: Nose normal.  Cardiovascular: Normal rate, regular rhythm and normal heart sounds.  Exam reveals no gallop and no friction rub.   No murmur heard. Equal radial pulses b/l    Pulmonary/Chest: Effort normal and breath sounds normal. No respiratory distress. She has no wheezes. She has no rales.  Abdominal: Soft. Bowel sounds are normal. She exhibits no distension. There is no tenderness. There is no rebound and no guarding.  Skin: Skin is warm and dry. No rash noted. She is not diaphoretic. No erythema. No pallor.   Assessment & Plan:   Please see problem based assessment and plan.

## 2015-11-17 NOTE — Patient Instructions (Signed)
Patient instructed to take medications as defined in the Anti-coagulation Track section of this encounter.  Patient instructed to take today's dose.  Patient verbalized understanding of these instructions.    

## 2015-11-17 NOTE — Progress Notes (Signed)
Indication: Previous DVT and CVAs while on plavix and ASA. Duration: As long as benefits outweigh risks. INR: At target. Agree with Dr. Gladstone Pih assessment and plan.

## 2015-11-17 NOTE — Patient Instructions (Signed)
Start taking losartan 100mg  daily.   Keep checking your blood pressure in your right arm daily and recording it in your log book.

## 2015-11-18 NOTE — Assessment & Plan Note (Signed)
Blood pressure checked in both arms today again and there was a discrepancy. Left arm BP was 178/88 and right arm BP was 207/84. Pt is completely asymptomatic and hemodynamically stable.   - ordered chest xray which came back negative for mediastinal widening.  - can consider TEE to further evaluate for aortic dissection, however it is invasive and likely would not change management.

## 2015-11-18 NOTE — Assessment & Plan Note (Signed)
BP Readings from Last 3 Encounters:  11/17/15 207/84  10/20/15 144/78  10/06/15 136/72    Lab Results  Component Value Date   NA 142 10/20/2015   K 4.4 10/20/2015   CREATININE 1.48* 10/20/2015    Assessment: Blood pressure control:  uncontrolled Progress toward BP goal:   not at goal Comments: losartan was recently increased to 50mg  last visit 1 month ago. Her BP log checked in her rt arm ranges from 128/71-202/81. Pt denies dizziness, HAs, and pain behind eyes. Her daughter fills her pill box for her. I personally reviewed her pill box and confirmed that she was taking the increased losartan 50mg  dose.   Plan: Medications:  Continue cardizem 60mg  BID, toprol 12.5 xl qd. Increased losartan to 100mg  daily.  Educational resources provided:  (DECLINED) Self management tools provided:   Other plans: f/u in 2 months when her next hemoglobin a1c is due.

## 2015-11-19 NOTE — Progress Notes (Signed)
Case discussed with Dr. Truong at the time of the visit.  We reviewed the resident's history and exam and pertinent patient test results.  I agree with the assessment, diagnosis, and plan of care documented in the resident's note. 

## 2015-11-24 NOTE — Addendum Note (Signed)
Addended by: Orson Gear on: 11/24/2015 04:08 PM   Modules accepted: Orders

## 2015-12-08 ENCOUNTER — Ambulatory Visit (INDEPENDENT_AMBULATORY_CARE_PROVIDER_SITE_OTHER): Payer: Medicare Other | Admitting: Pharmacist

## 2015-12-08 DIAGNOSIS — Z86718 Personal history of other venous thrombosis and embolism: Secondary | ICD-10-CM

## 2015-12-08 DIAGNOSIS — H338 Other retinal detachments: Secondary | ICD-10-CM | POA: Diagnosis not present

## 2015-12-08 DIAGNOSIS — H2511 Age-related nuclear cataract, right eye: Secondary | ICD-10-CM | POA: Diagnosis not present

## 2015-12-08 DIAGNOSIS — Z7901 Long term (current) use of anticoagulants: Secondary | ICD-10-CM

## 2015-12-08 DIAGNOSIS — H2702 Aphakia, left eye: Secondary | ICD-10-CM | POA: Diagnosis not present

## 2015-12-08 DIAGNOSIS — Z8673 Personal history of transient ischemic attack (TIA), and cerebral infarction without residual deficits: Secondary | ICD-10-CM

## 2015-12-08 LAB — POCT INR: INR: 1

## 2015-12-08 NOTE — Patient Instructions (Signed)
Patient instructed to take medications as defined in the Anti-coagulation Track section of this encounter.  Patient instructed to take today's dose.  Patient verbalized understanding of these instructions.    

## 2015-12-08 NOTE — Progress Notes (Signed)
Anti-Coagulation Progress Note  Chelsea Mcdaniel is a 82 y.o. female who is currently on an anti-coagulation regimen.    RECENT RESULTS: Recent results are below, the most recent result is correlated with a dose of 32.5 mg. per week: Lab Results  Component Value Date   INR 1.00 12/08/2015   INR 2.0 11/17/2015   INR 2.90 10/20/2015    ANTI-COAG DOSE: Anticoagulation Dose Instructions as of 12/08/2015      [name] Tue Wed Thu Fri Sat   New Dose 5 mg 7.5 mg 5 mg 5 mg 7.5 mg 5 mg 5 mg       ANTICOAG SUMMARY: Anticoagulation Episode Summary    Current INR goal 2.0-3.0  Next INR check 12/29/2015  INR from last check 1.00! (12/08/2015)  Weekly max dose   Target end date   INR check location   Preferred lab   Send INR reminders to    Indications  EMBOLISM/THROMBOSIS DEEP VSL LWR EXTRM NOS (Resolved) [I82.409] Long-term (current) use of anticoagulants [Z79.01]        Comments         ANTICOAG TODAY: Anticoagulation Summary as of 12/08/2015    INR goal 2.0-3.0  Selected INR 1.00! (12/08/2015)  Next INR check 12/29/2015  Target end date    Indications  EMBOLISM/THROMBOSIS DEEP VSL LWR EXTRM NOS (Resolved) [I82.409] Long-term (current) use of anticoagulants [Z79.01]      Anticoagulation Episode Summary    INR check location    Preferred lab    Send INR reminders to    Comments       PATIENT INSTRUCTIONS: Patient Instructions  Patient instructed to take medications as defined in the Anti-coagulation Track section of this encounter.  Patient instructed to take today's dose.  Patient verbalized understanding of these instructions.       FOLLOW-UP Return in 3 weeks (on 12/29/2015) for Follow up INR at [place], III Pharm.D., CACP  f

## 2015-12-09 NOTE — Progress Notes (Signed)
INTERNAL MEDICINE TEACHING ATTENDING ADDENDUM - Shaida Route M.D  Duration- indefinte, Indication- recurrent CVAs, DVT, INR- subtherapeutic. Agree with pharmacy recommendations as outlined in their note.

## 2015-12-15 DIAGNOSIS — R5383 Other fatigue: Secondary | ICD-10-CM | POA: Diagnosis not present

## 2015-12-15 DIAGNOSIS — N183 Chronic kidney disease, stage 3 (moderate): Secondary | ICD-10-CM | POA: Diagnosis not present

## 2015-12-15 DIAGNOSIS — R809 Proteinuria, unspecified: Secondary | ICD-10-CM | POA: Diagnosis not present

## 2015-12-15 DIAGNOSIS — I10 Essential (primary) hypertension: Secondary | ICD-10-CM | POA: Diagnosis not present

## 2015-12-29 ENCOUNTER — Ambulatory Visit (INDEPENDENT_AMBULATORY_CARE_PROVIDER_SITE_OTHER): Payer: Medicare Other | Admitting: Pharmacist

## 2015-12-29 DIAGNOSIS — Z7901 Long term (current) use of anticoagulants: Secondary | ICD-10-CM | POA: Diagnosis not present

## 2015-12-29 DIAGNOSIS — Z8673 Personal history of transient ischemic attack (TIA), and cerebral infarction without residual deficits: Secondary | ICD-10-CM

## 2015-12-29 DIAGNOSIS — Z86718 Personal history of other venous thrombosis and embolism: Secondary | ICD-10-CM | POA: Diagnosis not present

## 2015-12-29 LAB — POCT INR: INR: 2

## 2015-12-29 NOTE — Patient Instructions (Signed)
Daughter is to provide the patient for dinner this evening--"something dark, green and leafy" in an attempt to further drive down the INR. Additionally, daughter was instructed to go to a pharmacy and purchase OTC "Flinstone's multivitamin Complete"--Gummies formulation, which has phytodianone/vitamin K1 in the formulation. Instructed to provide the patient TWO of these "gummies" tonight, and one tomorrow prior to procedure at 1100h. OMIT tomorrow's dose of warfarin, i.e. No warfarin before going to the dentist tomorrow.  Patient instructed to take medications as defined in the Anti-coagulation Track section of this encounter.  Patient instructed to OMIT tomorrow's dose (had already taken today's dose).   Patient verbalized understanding of these instructions.

## 2015-12-29 NOTE — Progress Notes (Signed)
Anti-Coagulation Progress Note  Chelsea Mcdaniel is a 82 y.o. female who is currently on an anti-coagulation regimen.    RECENT RESULTS: Recent results are below, the most recent result is correlated with a dose of 40 mg. per week: Lab Results  Component Value Date   INR 2.0 12/29/2015   INR 1.00 12/08/2015   INR 2.0 11/17/2015    ANTI-COAG DOSE: Anticoagulation Dose Instructions as of 12/29/2015      [name] Tue Wed Thu Fri Sat   New Dose 5 mg 7.5 mg Hold 5 mg 7.5 mg 5 mg 5 mg       ANTICOAG SUMMARY: Anticoagulation Episode Summary    Current INR goal 2.0-3.0  Next INR check 12/29/2015  INR from last check 2.0 (12/29/2015)  Weekly max dose   Target end date   INR check location   Preferred lab   Send INR reminders to    Indications  EMBOLISM/THROMBOSIS DEEP VSL LWR EXTRM NOS (Resolved) [I82.409] Long-term (current) use of anticoagulants [Z79.01]        Comments         ANTICOAG TODAY: Anticoagulation Summary as of 12/29/2015    INR goal 2.0-3.0  Selected INR 2.0 (12/29/2015)  Next INR check   Target end date    Indications  EMBOLISM/THROMBOSIS DEEP VSL LWR EXTRM NOS (Resolved) [I82.409] Long-term (current) use of anticoagulants [Z79.01]      Anticoagulation Episode Summary    INR check location    Preferred lab    Send INR reminders to    Comments       PATIENT INSTRUCTIONS: Patient Instructions  Daughter is to provide the patient for dinner this evening--"something dark, green and leafy" in an attempt to further drive down the INR. Additionally, daughter was instructed to go to a pharmacy and purchase OTC "Flinstone's multivitamin Complete"--Gummies formulation, which has phytodianone/vitamin K1 in the formulation. Instructed to provide the patient TWO of these "gummies" tonight, and one tomorrow prior to procedure at 1100h. OMIT tomorrow's dose of warfarin, i.e. No warfarin before going to the dentist tomorrow.  Patient instructed to take medications as defined  in the Anti-coagulation Track section of this encounter.  Patient instructed to OMIT tomorrow's dose (had already taken today's dose).   Patient verbalized understanding of these instructions.       FOLLOW-UP Return in 2 weeks (on 01/12/2016) for Follow up INR at [place], III Pharm.D., CACP

## 2015-12-29 NOTE — Progress Notes (Signed)
INTERNAL MEDICINE TEACHING ATTENDING ADDENDUM - Jermari Tamargo M.D  Duration- indefinite, Indication- recurrent CVA, DVT, INR- therapeutic. Agree with pharmacy recommendations as outlined in their note.     

## 2016-01-07 ENCOUNTER — Other Ambulatory Visit: Payer: Self-pay | Admitting: Internal Medicine

## 2016-01-07 ENCOUNTER — Encounter: Payer: Self-pay | Admitting: *Deleted

## 2016-01-07 NOTE — Telephone Encounter (Signed)
I will defer refilling this medication until she can be seen tomorrow by her former PCP, Dr. Redmond Pulling, She was started on this medication back in January and wanted to make sure that she was not having episodes of hypoglycemia prior to continuing it.

## 2016-01-08 ENCOUNTER — Ambulatory Visit: Payer: Self-pay | Admitting: Internal Medicine

## 2016-01-09 ENCOUNTER — Ambulatory Visit: Payer: Self-pay | Admitting: Internal Medicine

## 2016-01-09 ENCOUNTER — Encounter: Payer: Self-pay | Admitting: Internal Medicine

## 2016-01-09 ENCOUNTER — Ambulatory Visit (INDEPENDENT_AMBULATORY_CARE_PROVIDER_SITE_OTHER): Payer: Medicare Other | Admitting: Internal Medicine

## 2016-01-09 VITALS — BP 187/67 | HR 59 | Temp 98.7°F | Ht 62.0 in | Wt 145.0 lb

## 2016-01-09 DIAGNOSIS — R0989 Other specified symptoms and signs involving the circulatory and respiratory systems: Secondary | ICD-10-CM

## 2016-01-09 DIAGNOSIS — N183 Chronic kidney disease, stage 3 unspecified: Secondary | ICD-10-CM

## 2016-01-09 DIAGNOSIS — E1122 Type 2 diabetes mellitus with diabetic chronic kidney disease: Secondary | ICD-10-CM

## 2016-01-09 DIAGNOSIS — Z7984 Long term (current) use of oral hypoglycemic drugs: Secondary | ICD-10-CM | POA: Diagnosis not present

## 2016-01-09 LAB — POCT GLYCOSYLATED HEMOGLOBIN (HGB A1C): HEMOGLOBIN A1C: 6.4

## 2016-01-09 LAB — GLUCOSE, CAPILLARY: GLUCOSE-CAPILLARY: 196 mg/dL — AB (ref 65–99)

## 2016-01-09 NOTE — Assessment & Plan Note (Signed)
Assessment On manual recheck, her blood pressure on her left arm was 152/82 and right arm was 138/80. She denies chest pain, difficulty breathing, blurry vision. She is unable to tell me which medication she takes for her blood pressure.  I have a low clinical suspicion to suspect any anatomic anomaly of her aortic vasculature. CTA done 10 years ago did not show any signs of aneurysm or dissection. Chest x-ray done within the last 6 months not show signs of a widened mediastinum. Given her age and co-morbidities, it is quite plausible for this difference to be accounted for by vascular disease.  Plan -Stop reassessing.

## 2016-01-09 NOTE — Telephone Encounter (Signed)
She missed her appt with me on 06/15.  It looks like she should have enough glipizide to last through 02/03/16.  Can you please ask her to reschedule her appt?

## 2016-01-09 NOTE — Assessment & Plan Note (Addendum)
Assessment She was referred to nephrology given nephrotic range proteinuria seen on most recent urine microalbumin/creatinine. She is following up with a kidney doctor those on able to verbalize to me his recommendations. We will work on retrieving records from her last office visit with [employer]  Plan -Check urine microalbumin/creatinine ratio today to increase diagnostic yield for nephrotic range proteinuria  ADDENDUM 01/11/2016  1:51 PM:  Urine microalbumin creatinine ratio elevated at 3765.3, consistent with prior value of 5185.4 at 3 months ago. No change in management as she is already on ACEi/ARB.

## 2016-01-09 NOTE — Patient Instructions (Addendum)
STOP glipizide XL and check your blood sugar once daily.  Please see me back next month to see how you do off this medication.

## 2016-01-09 NOTE — Assessment & Plan Note (Signed)
Assessment Upon assuming her care as a new PCP, it was brought to attention that she needed a refill of glipizide XL 2.5 mg. She has been on sitagliptin 25 mg daily. She does not have a glucometer with her today to review her blood sugars though tells me they are doing quite well. She does not report signs of hypoglycemia/hyperglycemia, like blurry vision, polyuria, polydipsia, weakness.  A1c in office today 6.5, far improved from 7.0 back in March 2017. Given the concern for adverse effects associated with aggressive glycemic control, I'm comfortable with leaving her in the A1c 7-8 range.  Plan -Discontinued glipizide XL 2.5mg  daily with plan to reassess her blood sugars at follow-up.

## 2016-01-09 NOTE — Progress Notes (Signed)
   Subjective:    Patient ID: Chelsea Mcdaniel, female    DOB: Feb 17, 1928, 82 y.o.   MRN: [id number]  HPI Chelsea Mcdaniel is a 82 year old female who presents today for blood pressure recheck. Please see assessment & plan for status of chronic medical problems.    Review of Systems     Objective:   Physical Exam  Constitutional: She is oriented to person, place, and time. She appears well-developed and well-nourished.  HENT:  Head: Normocephalic and atraumatic.  Eyes: Conjunctivae are normal. No scleral icterus.  Cardiovascular: Normal rate and regular rhythm.   Pulmonary/Chest: Effort normal. No respiratory distress.  Neurological: She is alert and oriented to person, place, and time.  Skin: Skin is warm and dry.          Assessment & Plan:

## 2016-01-10 LAB — MICROALBUMIN / CREATININE URINE RATIO
Creatinine, Urine: 211.3 mg/dL
MICROALB/CREAT RATIO: 3765.3 mg/g creat — ABNORMAL HIGH (ref 0.0–30.0)
Microalbumin, Urine: 7956.1 ug/mL

## 2016-01-11 ENCOUNTER — Telehealth: Payer: Self-pay | Admitting: Internal Medicine

## 2016-01-11 NOTE — Addendum Note (Signed)
Addended by: Riccardo Dubin on: 01/11/2016 01:51 PM   Modules accepted: Miquel Dunn

## 2016-01-11 NOTE — Telephone Encounter (Signed)
Dr. Elie Confer, this patient's daughter-in-law told me that the patient is scheduled for dental work on June 20. She is unclear as to what the exact procedure is though the dentist did tell her that she needs to adjust her warfarin dosing.  From your visit on 6/5, I saw that you recommended the following. -Night prior to procedure: eat gummy multivitamins x 2 -Day of procedure: eat gummy multivitamin x 1 and skip warfarin  Is this correct? If so, we can recommend the following in preparation for the procedure scheduled later this week.  Thank you.

## 2016-01-12 NOTE — Progress Notes (Signed)
Internal Medicine Clinic Attending  Case discussed with Dr. Patel,Rushil at the time of the visit.  We reviewed the resident's history and exam and pertinent patient test results.  I agree with the assessment, diagnosis, and plan of care documented in the resident's note.  

## 2016-01-20 ENCOUNTER — Telehealth: Payer: Self-pay | Admitting: Internal Medicine

## 2016-01-20 NOTE — Telephone Encounter (Signed)
States pt lost all meds needs refills  [facility]

## 2016-01-21 ENCOUNTER — Other Ambulatory Visit: Payer: Self-pay | Admitting: Internal Medicine

## 2016-01-22 NOTE — Telephone Encounter (Signed)
As this patient was seen by me and deemed necessary for their treatment, I will refill this prescription for sitagliptin.

## 2016-01-22 NOTE — Telephone Encounter (Signed)
Spoke w/ daugh in law, pharm is getting meds together and will deliver today, needed appt w/ dr Elie Confer, will be in tomorrow, will call for problems

## 2016-01-23 ENCOUNTER — Ambulatory Visit (INDEPENDENT_AMBULATORY_CARE_PROVIDER_SITE_OTHER): Payer: Medicare Other | Admitting: Pharmacist

## 2016-01-23 ENCOUNTER — Other Ambulatory Visit: Payer: Self-pay | Admitting: Internal Medicine

## 2016-01-23 DIAGNOSIS — Z7901 Long term (current) use of anticoagulants: Secondary | ICD-10-CM | POA: Diagnosis not present

## 2016-01-23 DIAGNOSIS — Z86718 Personal history of other venous thrombosis and embolism: Secondary | ICD-10-CM | POA: Diagnosis not present

## 2016-01-23 LAB — POCT INR: INR: 1.7

## 2016-01-23 NOTE — Progress Notes (Signed)
Anticoagulation Management Chelsea Mcdaniel is a 82 y.o. female who reports to the clinic for monitoring of warfarin treatment.    Indication: DVThistory Duration: indefinite  Anticoagulation Clinic Visit History: Patient does not report signs/symptoms of bleeding or thromboembolism  Anticoagulation Episode Summary    Current INR goal 2.0-3.0  Next INR check 02/02/2016  INR from last check 1.7! (01/23/2016)  Weekly max dose   Target end date   INR check location   Preferred lab   Send INR reminders to    Indications  EMBOLISM/THROMBOSIS DEEP VSL LWR EXTRM NOS (Resolved) [I82.409] Long-term (current) use of anticoagulants [Z79.01]        Comments        ASSESSMENT Recent Results: The most recent result is correlated with 40 mg per week: Lab Results  Component Value Date   INR 1.7 01/23/2016   INR 2.0 12/29/2015   INR 1.00 12/08/2015   Anticoagulation Dosing: INR as of 01/23/2016 and Previous Dosing Information    INR Dt INR Goal [employer] Sun Mon Tue Wed Thu Fri Sat   01/23/2016 1.7 2.0-3.0 40 mg 5 mg 7.5 mg 5 mg 5 mg 7.5 mg 5 mg 5 mg    Previous description        Will OMIT dose scheduled for 12/30/15 as ONE TIME ONLY omitted dose.     Anticoagulation Dose Instructions as of 01/23/2016      Total Sun Mon Tue Wed Thu Fri Sat   New Dose 42.5 mg 5 mg 7.5 mg 5 mg 5 mg 7.5 mg 7.5 mg 5 mg     (5 mg x 1)  (5 mg x 1.5)  (5 mg x 1)  (5 mg x 1)  (5 mg x 1.5)  (5 mg x 1.5)  (5 mg x 1)                         Description        Unable to tell whether patient has missed a dose, but may have---daughter states that patient's medications went missing Monday.       INR today: Subtherapeutic  PLAN Weekly dose was increased by 6% to 42.5 mg per week  Patient Instructions  Patient and daughter educated about medication as defined in this encounter and verbalized understanding by repeating back instructions provided.    Patient advised to contact clinic or seek medical  attention if signs/symptoms of bleeding or thromboembolism occur.  Patient verbalized understanding by repeating back information and was advised to contact me if further medication-related questions arise. Patient was also provided an information handout.  Follow-up 2 weeks  Kim,Jennifer J  15 minutes spent face-to-face with the patient during the encounter. 50% of time spent on education. 50% of time was spent on assessment and plan.

## 2016-01-23 NOTE — Patient Instructions (Addendum)
Patient and daughter educated about medication as defined in this encounter and verbalized understanding by repeating back instructions provided.

## 2016-01-23 NOTE — Progress Notes (Signed)
Reviewed Thanks DrG 

## 2016-01-23 NOTE — Telephone Encounter (Signed)
As this patient was seen by me and deemed necessary for their treatment, I will refill this prescription for metoprolol XL.

## 2016-01-29 DIAGNOSIS — R809 Proteinuria, unspecified: Secondary | ICD-10-CM | POA: Diagnosis not present

## 2016-01-29 DIAGNOSIS — N189 Chronic kidney disease, unspecified: Secondary | ICD-10-CM | POA: Diagnosis not present

## 2016-01-29 DIAGNOSIS — N183 Chronic kidney disease, stage 3 (moderate): Secondary | ICD-10-CM | POA: Diagnosis not present

## 2016-01-29 DIAGNOSIS — I10 Essential (primary) hypertension: Secondary | ICD-10-CM | POA: Diagnosis not present

## 2016-01-29 DIAGNOSIS — N184 Chronic kidney disease, stage 4 (severe): Secondary | ICD-10-CM | POA: Diagnosis not present

## 2016-02-01 ENCOUNTER — Emergency Department (HOSPITAL_COMMUNITY): Payer: Medicare Other

## 2016-02-01 ENCOUNTER — Emergency Department (HOSPITAL_COMMUNITY)
Admission: EM | Admit: 2016-02-01 | Discharge: 2016-02-01 | Disposition: A | Discharge status: Home / Self Care | Payer: Medicare Other | Attending: Emergency Medicine | Admitting: Emergency Medicine

## 2016-02-01 ENCOUNTER — Encounter (HOSPITAL_COMMUNITY): Payer: Self-pay | Admitting: *Deleted

## 2016-02-01 ENCOUNTER — Telehealth: Payer: Self-pay | Admitting: Internal Medicine

## 2016-02-01 DIAGNOSIS — E119 Type 2 diabetes mellitus without complications: Secondary | ICD-10-CM | POA: Diagnosis not present

## 2016-02-01 DIAGNOSIS — I10 Essential (primary) hypertension: Secondary | ICD-10-CM | POA: Diagnosis not present

## 2016-02-01 DIAGNOSIS — Z7984 Long term (current) use of oral hypoglycemic drugs: Secondary | ICD-10-CM | POA: Insufficient documentation

## 2016-02-01 DIAGNOSIS — Z7901 Long term (current) use of anticoagulants: Secondary | ICD-10-CM | POA: Insufficient documentation

## 2016-02-01 DIAGNOSIS — I639 Cerebral infarction, unspecified: Secondary | ICD-10-CM | POA: Diagnosis not present

## 2016-02-01 DIAGNOSIS — Z79899 Other long term (current) drug therapy: Secondary | ICD-10-CM | POA: Diagnosis not present

## 2016-02-01 DIAGNOSIS — D649 Anemia, unspecified: Secondary | ICD-10-CM | POA: Diagnosis not present

## 2016-02-01 DIAGNOSIS — N289 Disorder of kidney and ureter, unspecified: Secondary | ICD-10-CM

## 2016-02-01 LAB — BASIC METABOLIC PANEL
ANION GAP: 7 (ref 5–15)
BUN: 27 mg/dL — ABNORMAL HIGH (ref 6–20)
CALCIUM: 8.7 mg/dL — AB (ref 8.9–10.3)
CO2: 23 mmol/L (ref 22–32)
Chloride: 106 mmol/L (ref 101–111)
Creatinine, Ser: 2.31 mg/dL — ABNORMAL HIGH (ref 0.44–1.00)
GFR, EST AFRICAN AMERICAN: 21 mL/min — AB (ref >60)
GFR, EST NON AFRICAN AMERICAN: 18 mL/min — AB (ref >60)
GLUCOSE: 188 mg/dL — AB (ref 65–99)
POTASSIUM: 4.7 mmol/L (ref 3.5–5.1)
Sodium: 136 mmol/L (ref 135–145)

## 2016-02-01 LAB — CBC WITH DIFFERENTIAL/PLATELET
BASOS ABS: 0 10*3/uL (ref 0.0–0.1)
BASOS PCT: 1 %
EOS PCT: 4 %
Eosinophils Absolute: 0.2 10*3/uL (ref 0.0–0.7)
HCT: 31 % — ABNORMAL LOW (ref 36.0–46.0)
Hemoglobin: 9.8 g/dL — ABNORMAL LOW (ref 12.0–15.0)
LYMPHS ABS: 1.9 10*3/uL (ref 0.7–4.0)
LYMPHS PCT: 38 %
MCH: 30.4 pg (ref 26.0–34.0)
MCHC: 31.6 g/dL (ref 30.0–36.0)
MCV: 96.3 fL (ref 78.0–100.0)
Monocytes Absolute: 0.4 10*3/uL (ref 0.1–1.0)
Monocytes Relative: 9 %
Neutro Abs: 2.4 10*3/uL (ref 1.7–7.7)
Neutrophils Relative %: 48 %
PLATELETS: 235 10*3/uL (ref 150–400)
RBC: 3.22 MIL/uL — AB (ref 3.87–5.11)
RDW: 13.5 % (ref 11.5–15.5)
WBC: 4.9 10*3/uL (ref 4.0–10.5)

## 2016-02-01 LAB — URINE MICROSCOPIC-ADD ON: RBC / HPF: NONE SEEN RBC/hpf (ref 0–5)

## 2016-02-01 LAB — URINALYSIS, ROUTINE W REFLEX MICROSCOPIC
Bilirubin Urine: NEGATIVE
Glucose, UA: 100 mg/dL — AB
Ketones, ur: NEGATIVE mg/dL
LEUKOCYTES UA: NEGATIVE
NITRITE: NEGATIVE
PROTEIN: 100 mg/dL — AB
SPECIFIC GRAVITY, URINE: 1.013 (ref 1.005–1.030)
pH: 7.5 (ref 5.0–8.0)

## 2016-02-01 LAB — PROTIME-INR
INR: 2.05 — AB (ref 0.00–1.49)
PROTHROMBIN TIME: 23 s — AB (ref 11.6–15.2)

## 2016-02-01 MED ORDER — SODIUM CHLORIDE 0.9 % IV BOLUS (SEPSIS)
500.0000 mL | Freq: Once | INTRAVENOUS | Status: AC
  Administered 2016-02-01: 500 mL via INTRAVENOUS

## 2016-02-01 NOTE — Telephone Encounter (Signed)
Spoke with Dr. Jeanell Sparrow (ED physician). Patient here with family, found to have elevated Cr above baseline and BP up. Concern that she may not be taking medications correctly. She has been hypertensive at past visits. Discussed case with Dr. Jeanell Sparrow and will get patient into clinic either tomorrow or Tuesday for BP recheck and titration of medications as well as repeat bmet. Will forward to clinic staff.  Albin Felling, MD, MPH Internal Medicine Resident, PGY-III Pager: [phone]

## 2016-02-01 NOTE — Discharge Instructions (Signed)
Make sure all meds are taken as prescribed. Recheck with her doctor tomorrow for recheck creatinine.

## 2016-02-01 NOTE — ED Provider Notes (Signed)
CSN: [id number]     Arrival date & time 02/01/16  1003 History   First MD Initiated Contact with Patient 02/01/16 1013     Chief Complaint  Patient presents with  . Hypertension     (Consider location/radiation/quality/duration/timing/severity/associated sxs/prior Treatment) HPI 82 y.o. Female with history of dm, hypertension, cva presents today with increased bp and some ongoing  Confusion which may be worse.  NO fever, lateralized deficits, nausea, vomiting and diarrhe.  Denies any complaints.   Past Medical History  Diagnosis Date  . Diabetes mellitus   . Hyperlipidemia   . Hypertension   . CVA (cerebrovascular accident) [facility])  October 2007     CT of the head atrophy with multiple remote insults noted but no definite acute findings  . Blindness of left eye      likely related to stroke, left cataract removed from that eye with complications  . Anemia      normocytic anemia with baseline hemoglobin 10-11  . Chronic kidney disease 2006     left renal artery stenosis with probable hemodynamic significance, kidneys are normal in morphology without focal lesions or hydronephrosis this is based but cannot A. of the abdomen with and without contrast done on the 31st 2006  . Glaucoma   . Personal history of DVT (deep vein thrombosis) 08/11/2010  . Calculus gallbladder and bile duct with cholecystitis with obstruction 01/2015   Past Surgical History  Procedure Laterality Date  . Transthoracic echocardiogram   May 2008     left ventricular systolic function normal EF estimated range of 55-60%, no definite diagnostic evidence of left ventricular regional wall motion abnormalities, Doppler parameters consistent with abnormal left ventricular relaxation, findings suggestive of possible bicuspid aortic valve and aortic valve thickness mildly increase  . Abdominal hysterectomy    . Eye surgery     Family History  Problem Relation Age of Onset  . Heart disease Mother   . Diabetes Mother   .  Hyperlipidemia Mother   . Hypertension Mother   . Heart disease Father   . Diabetes Father   . Hyperlipidemia Father   . Hypertension Father    Social History  Substance Use Topics  . Smoking status: Never Smoker   . Smokeless tobacco: Never Used  . Alcohol Use: No   OB History    No data available     Review of Systems  All other systems reviewed and are negative.     Allergies  Review of patient's allergies indicates no known allergies.  Home Medications   Prior to Admission medications   Medication Sig Start Date End Date Taking? Authorizing Provider  acetaminophen (TYLENOL) 325 MG tablet Take 2 tablets (650 mg total) by mouth every 6 (six) hours as needed. Patient taking differently: Take 650 mg by mouth every 6 (six) hours as needed for moderate pain.  08/22/14  Yes Juluis Mire, MD  allopurinol (ZYLOPRIM) 100 MG tablet Take 1.5 tablets (150 mg total) by mouth daily. 10/07/15 10/06/16 Yes Loleta Chance, MD  Calcium Carbonate-Vitamin D 600-400 MG-UNIT per tablet Take 1 tablet by mouth 2 (two) times daily.   Yes Historical Provider, MD  cloNIDine (CATAPRES - DOSED IN MG/24 HR) 0.3 mg/24hr patch Place 1 patch onto the skin every Thursday. 01/29/16  Yes Historical Provider, MD  colchicine 0.6 MG tablet Take 0.5 tablets (0.3 mg total) by mouth daily. 09/24/15  Yes Loleta Chance, MD  diltiazem (CARDIZEM SR) 60 MG 12 hr capsule Take 1 capsule (60 mg  total) by mouth 2 (two) times daily. 09/24/15  Yes Loleta Chance, MD  JANUVIA 25 MG tablet TAKE 1 TABLET BY MOUTH EVERY DAY 01/22/16  Yes Riccardo Dubin, MD  losartan (COZAAR) 100 MG tablet Take 1 tablet (100 mg total) by mouth daily. 11/17/15 11/16/16 Yes Norman Herrlich, MD  LUMIGAN 0.01 % SOLN Place 1 drop into both eyes every morning.  12/05/13  Yes Historical Provider, MD  magnesium hydroxide (MILK OF MAGNESIA) 400 MG/5ML suspension Take 30 mLs by mouth daily as needed for mild constipation or indigestion.   Yes Historical Provider, MD   metoprolol succinate (TOPROL-XL) 100 MG 24 hr tablet Take 100 mg by mouth daily. 01/29/16  Yes Historical Provider, MD  Sennosides (EX-LAX PO) Take 5 mLs by mouth daily as needed (for laxative).   Yes Historical Provider, MD  simethicone (GAS-X) 80 MG chewable tablet Chew 1 tablet (80 mg total) by mouth every 6 (six) hours as needed for flatulence. 01/08/15  Yes Corky Sox, MD  warfarin (COUMADIN) 5 MG tablet Take one pill daily except take one half pill (2.5mg ) on Mondays and Thursdays Patient taking differently: Take 5-7.5 mg by mouth every morning. Takes 7.5mg  on mon, thurs, fri Takes 5 mg all other days 10/24/15  Yes Bartholomew Crews, MD  ACCU-CHEK AVIVA PLUS test strip Use 1 time daily to check blood sugar. 11/06/14   Francesca Oman, DO  ACCU-CHEK SOFTCLIX LANCETS lancets Use 1 time daily to check blood sugar. 07/02/15   Francesca Oman, DO  Blood Glucose Monitoring Suppl (ACCU-CHEK AVIVA) device Use as instructed 04/06/15 04/05/16  Francesca Oman, DO  metoprolol succinate (TOPROL-XL) 25 MG 24 hr tablet TAKE ONE-HALF TABLET BY MOUTH EVERY DAY 01/23/16   Riccardo Dubin, MD  senna-docusate (SENOKOT-S) 8.6-50 MG tablet Take 2 tablets by mouth at bedtime as needed for mild constipation. 08/24/15   Francesca Oman, DO   BP 195/70 mmHg  Pulse 48  Temp(Src) 97.9 F (36.6 C) (Rectal)  Resp 18  SpO2 99% Physical Exam  Constitutional: She is oriented to person, place, and time. She appears well-developed and well-nourished.  HENT:  Head: Normocephalic and atraumatic.  Right Ear: External ear normal.  Left Ear: External ear normal.  Nose: Nose normal.  Mouth/Throat: Oropharynx is clear and moist.  Eyes: Conjunctivae and EOM are normal.  Neck: Normal range of motion. Neck supple.  Cardiovascular: Normal rate, regular rhythm, normal heart sounds and intact distal pulses.   Pulmonary/Chest: Effort normal and breath sounds normal.  Abdominal: Soft. Bowel sounds are normal.  Musculoskeletal: Normal range  of motion.  Neurological: She is alert and oriented to person, place, and time. She has normal reflexes.  Skin: Skin is warm and dry.  Psychiatric: She has a normal mood and affect. Her behavior is normal. Judgment and thought content normal.  Nursing note and vitals reviewed.   ED Course  Procedures (including critical care time) Labs Review Labs Reviewed  CBC WITH DIFFERENTIAL/PLATELET - Abnormal; Notable for the following:    RBC 3.22 (*)    Hemoglobin 9.8 (*)    HCT 31.0 (*)    All other components within normal limits  BASIC METABOLIC PANEL - Abnormal; Notable for the following:    Glucose, Bld 188 (*)    BUN 27 (*)    Creatinine, Ser 2.31 (*)    Calcium 8.7 (*)    GFR calc non Af Amer 18 (*)    GFR calc Af Wyvonnia Lora  21 (*)    All other components within normal limits  URINALYSIS, ROUTINE W REFLEX MICROSCOPIC (NOT AT [facility])  PROTIME-INR    Imaging Review Ct Head Wo Contrast  02/01/2016  CLINICAL DATA:  CVA EXAM: CT HEAD WITHOUT CONTRAST TECHNIQUE: Contiguous axial images were obtained from the base of the skull through the vertex without intravenous contrast. COMPARISON:  05/04/2006 FINDINGS: There is encephalomalacia in the right frontal and parietal lobes. Lacune or infarcts in the left basal ganglia are stable. There is no mass effect, midline shift, or acute intracranial hemorrhage. Trace fluid in the bilateral mastoid air cells. No skull fracture. IMPRESSION: No acute intracranial pathology. Electronically Signed   By: Marybelle Killings M.D.   On: 02/01/2016 14:23   I have personally reviewed and evaluated these images and lab results as part of my medical decision-making.   EKG Interpretation   Date/Time:  Sunday February 01 2016 10:38:36 EDT Ventricular Rate:  59 PR Interval:    QRS Duration: 114 QT Interval:  441 QTC Calculation: 437 R Axis:   -56 Text Interpretation:  Sinus rhythm Prolonged PR interval Left anterior  fascicular block Abnormal R-wave progression, early  transition Left  ventricular hypertrophy Anterior Q waves, possibly due to LVH No  significant change since last tracing Confirmed by Roberta Kelly MD, Andee Poles  [id number]) on 02/01/2016 12:16:39 PM      MDM   Final diagnoses:  Essential hypertension  Renal insufficiency  Anemia, unspecified anemia type    82 y.o. Female h.o. Hypertension presents today with increased bp and creatinine.  Unclear if taking meds regularly.  Plan iv fluid, recheck creatinine tomorrow, daughter in law will assure meds being taken as prescribed. Discussed with Dr. Arcelia Jew and she will place note to pmd in epic.  Discussed with daughter and son and they are in agreement with plan.     Pattricia Boss, MD 02/01/16 [phone]

## 2016-02-01 NOTE — ED Notes (Signed)
Pt arrives with c/o htn. Pt is currently asymptomatic, denies h/a, blurred vision. Family is concerned rt pt not being "mentally there". Pt is a&o x4.

## 2016-02-02 ENCOUNTER — Ambulatory Visit (INDEPENDENT_AMBULATORY_CARE_PROVIDER_SITE_OTHER): Payer: Medicare Other | Admitting: Pharmacist

## 2016-02-02 DIAGNOSIS — Z86718 Personal history of other venous thrombosis and embolism: Secondary | ICD-10-CM

## 2016-02-02 DIAGNOSIS — Z7901 Long term (current) use of anticoagulants: Secondary | ICD-10-CM

## 2016-02-02 LAB — POCT INR: INR: 2.1

## 2016-02-02 NOTE — Progress Notes (Signed)
INTERNAL MEDICINE TEACHING ATTENDING ADDENDUM - Lucious Groves, DO  Duration- indefinate, Indication- 2 unproveded DVT, INR- 2.1. Agree with pharmacy recommendations as outlined in their note. Low normal INR, slight increase, follow up in 3 weeks.

## 2016-02-02 NOTE — Patient Instructions (Signed)
Patient instructed to take medications as defined in the Anti-coagulation Track section of this encounter.  Patient instructed to take today's dose.  Patient verbalized understanding of these instructions.    

## 2016-02-02 NOTE — Telephone Encounter (Signed)
Dr. Arcelia Jew,  I added the patient to the [facility] schedule for Wednesday at 2:45. Is this okay, or does she need to go back to the ED?

## 2016-02-02 NOTE — Progress Notes (Signed)
Anti-Coagulation Progress Note  Chelsea Mcdaniel is a 82 y.o. female who is currently on an anti-coagulation regimen.    RECENT RESULTS: Recent results are below, the most recent result is correlated with a dose of 42.5 mg. per week: Lab Results  Component Value Date   INR 2.10 02/02/2016   INR 2.05* 02/01/2016   INR 1.7 01/23/2016    ANTI-COAG DOSE: Anticoagulation Dose Instructions as of 02/02/2016      Sun Mon Tue Wed Thu Fri Sat   New Dose 7.5 mg 5 mg 7.5 mg 5 mg 7.5 mg 5 mg 7.5 mg       ANTICOAG SUMMARY: Anticoagulation Episode Summary    Current INR goal 2.0-3.0  Next INR check 02/23/2016  INR from last check 2.10 (02/02/2016)  Weekly max dose   Target end date   INR check location   Preferred lab   Send INR reminders to    Indications  EMBOLISM/THROMBOSIS DEEP VSL LWR EXTRM NOS (Resolved) [I82.409] Long-term (current) use of anticoagulants [Z79.01]        Comments         ANTICOAG TODAY: Anticoagulation Summary as of 02/02/2016    INR goal 2.0-3.0  Selected INR 2.10 (02/02/2016)  Next INR check 02/23/2016  Target end date    Indications  EMBOLISM/THROMBOSIS DEEP VSL LWR EXTRM NOS (Resolved) [I82.409] Long-term (current) use of anticoagulants [Z79.01]      Anticoagulation Episode Summary    INR check location    Preferred lab    Send INR reminders to    Comments       PATIENT INSTRUCTIONS: Patient Instructions  Patient instructed to take medications as defined in the Anti-coagulation Track section of this encounter.  Patient instructed to take today's dose.  Patient verbalized understanding of these instructions.       FOLLOW-UP Return in 3 weeks (on 02/23/2016) for Follow up INR at 2:15PM.  [name], III Pharm.D., CACP

## 2016-02-02 NOTE — Telephone Encounter (Signed)
Message was sent to triage.  Currently no openings left for today or tomorrow.

## 2016-02-02 NOTE — Telephone Encounter (Signed)
Wednesday is fine. Thanks!

## 2016-02-03 ENCOUNTER — Other Ambulatory Visit: Payer: Self-pay | Admitting: Nephrology

## 2016-02-03 DIAGNOSIS — H2511 Age-related nuclear cataract, right eye: Secondary | ICD-10-CM | POA: Diagnosis not present

## 2016-02-03 DIAGNOSIS — R1310 Dysphagia, unspecified: Secondary | ICD-10-CM

## 2016-02-03 DIAGNOSIS — H2702 Aphakia, left eye: Secondary | ICD-10-CM | POA: Diagnosis not present

## 2016-02-03 DIAGNOSIS — H338 Other retinal detachments: Secondary | ICD-10-CM | POA: Diagnosis not present

## 2016-02-03 DIAGNOSIS — H401113 Primary open-angle glaucoma, right eye, severe stage: Secondary | ICD-10-CM | POA: Diagnosis not present

## 2016-02-04 ENCOUNTER — Ambulatory Visit (INDEPENDENT_AMBULATORY_CARE_PROVIDER_SITE_OTHER): Payer: Medicare Other | Admitting: Internal Medicine

## 2016-02-04 VITALS — BP 133/60 | HR 48 | Temp 97.5°F | Ht 62.0 in | Wt 143.7 lb

## 2016-02-04 DIAGNOSIS — I10 Essential (primary) hypertension: Secondary | ICD-10-CM | POA: Diagnosis not present

## 2016-02-04 DIAGNOSIS — I152 Hypertension secondary to endocrine disorders: Secondary | ICD-10-CM

## 2016-02-04 DIAGNOSIS — E1159 Type 2 diabetes mellitus with other circulatory complications: Secondary | ICD-10-CM | POA: Diagnosis not present

## 2016-02-04 DIAGNOSIS — Z7984 Long term (current) use of oral hypoglycemic drugs: Secondary | ICD-10-CM

## 2016-02-04 LAB — GLUCOSE, CAPILLARY: GLUCOSE-CAPILLARY: 134 mg/dL — AB (ref 65–99)

## 2016-02-04 NOTE — Patient Instructions (Addendum)
It was a pleasure seeing you today. Thank you for choosing [facility] for your healthcare needs.  -- Please stop the metoprolol  -- Please return in 2 weeks for blood pressure and medication check -- Will get blood work today

## 2016-02-04 NOTE — Assessment & Plan Note (Signed)
Presents as a follow-up from an emergency visit where her BP was elevated with her systolic nearing [id number]. She had no symptoms at this time but her lab work was significant for an elevated creatine. This HTN urgency was in the setting of medication non-compliance. The patients daughter says she is forgetful and does not remember to take her medication on a daily basis. When she does follow her medication regimen her BP is at goal. In the office today her BP was 133/60. She has no additional acute complaints at today's visit and denies headaches, SOB, CP, n/v and abdominal pain or changes in her bowel and bladder habits. She also denies fevers, chills and weight loss.   At her visit today her pulse was 48 and in the emergency room she was also bradycardic. At this time we will d/c her metoprolol XR as we feel the risks of a fall are greater than any potential BP benefit from this medication.   -- BMET to f/u on ED labs -- Decrease metoprolol XR -- Follow-up in 2 weeks for BP and medication check

## 2016-02-04 NOTE — Progress Notes (Signed)
CC: BP follow-up HPI: [name] is a 82 y.o. female with a h/o of HTN, DM, CKD stage III and anemia who presents for an ED follow-up visit for hypertensive urgency.   Please see problem-based charting for status of medical issues pertinent to this visit.     Past Medical History  Diagnosis Date  . Diabetes mellitus   . Hyperlipidemia   . Hypertension   . CVA (cerebrovascular accident) [facility])  October 2007     CT of the head atrophy with multiple remote insults noted but no definite acute findings  . Blindness of left eye      likely related to stroke, left cataract removed from that eye with complications  . Anemia      normocytic anemia with baseline hemoglobin 10-11  . Chronic kidney disease 2006     left renal artery stenosis with probable hemodynamic significance, kidneys are normal in morphology without focal lesions or hydronephrosis this is based but cannot A. of the abdomen with and without contrast done on the 31st 2006  . Glaucoma   . Personal history of DVT (deep vein thrombosis) 08/11/2010  . Calculus gallbladder and bile duct with cholecystitis with obstruction 01/2015   Current Outpatient Rx  Name  Route  Sig  Dispense  Refill  . ACCU-CHEK AVIVA PLUS test strip      Use 1 time daily to check blood sugar.   100 each   11     This prescription was filled today. Any refills au ...   . ACCU-CHEK SOFTCLIX LANCETS lancets      Use 1 time daily to check blood sugar.   100 each   3     This prescription was filled today. Any refills au ...   . acetaminophen (TYLENOL) 325 MG tablet   Oral   Take 2 tablets (650 mg total) by mouth every 6 (six) hours as needed. Patient taking differently: Take 650 mg by mouth every 6 (six) hours as needed for moderate pain.          Marland Kitchen allopurinol (ZYLOPRIM) 100 MG tablet   Oral   Take 1.5 tablets (150 mg total) by mouth daily.   90 tablet   3   . Blood Glucose Monitoring Suppl (ACCU-CHEK AVIVA) device      Use  as instructed   1 each   0   . Calcium Carbonate-Vitamin D 600-400 MG-UNIT per tablet   Oral   Take 1 tablet by mouth 2 (two) times daily.         . cloNIDine (CATAPRES - DOSED IN MG/24 HR) 0.3 mg/24hr patch   Transdermal   Place 1 patch onto the skin every Thursday.      6   . colchicine 0.6 MG tablet   Oral   Take 0.5 tablets (0.3 mg total) by mouth daily.   30 tablet   3   . diltiazem (CARDIZEM SR) 60 MG 12 hr capsule   Oral   Take 1 capsule (60 mg total) by mouth 2 (two) times daily.   60 capsule   11   . JANUVIA 25 MG tablet      TAKE 1 TABLET BY MOUTH EVERY DAY   90 tablet   1   . losartan (COZAAR) 100 MG tablet   Oral   Take 1 tablet (100 mg total) by mouth daily.   30 tablet   5   . LUMIGAN 0.01 % SOLN  Both Eyes   Place 1 drop into both eyes every morning.          . magnesium hydroxide (MILK OF MAGNESIA) 400 MG/5ML suspension   Oral   Take 30 mLs by mouth daily as needed for mild constipation or indigestion.         . metoprolol succinate (TOPROL-XL) 25 MG 24 hr tablet      TAKE ONE-HALF TABLET BY MOUTH EVERY DAY   45 tablet   3   . senna-docusate (SENOKOT-S) 8.6-50 MG tablet   Oral   Take 2 tablets by mouth at bedtime as needed for mild constipation.   60 tablet   2   . Sennosides (EX-LAX PO)   Oral   Take 5 mLs by mouth daily as needed (for laxative).         . simethicone (GAS-X) 80 MG chewable tablet   Oral   Chew 1 tablet (80 mg total) by mouth every 6 (six) hours as needed for flatulence.   30 tablet   0   . warfarin (COUMADIN) 5 MG tablet      Take one pill daily except take one half pill (2.5mg ) on Mondays and Thursdays Patient taking differently: Take 5-7.5 mg by mouth every morning. Takes 7.5mg  on mon, thurs, fri Takes 5 mg all other days   30 tablet   3     Review of Systems: A complete ROS was negative except as per HPI.  Physical Exam: Filed Vitals:   02/04/16 1516  BP: 133/60  Pulse: 48  Temp: 97.5  F (36.4 C)  TempSrc: Oral  Height: 5\' 2"  (1.575 m)  Weight: 143 lb 11.2 oz (65.182 kg)  SpO2: 99%   General appearance: alert and cooperative Head: Normocephalic, without obvious abnormality, atraumatic Lungs: clear to auscultation bilaterally Heart: bradycardia, regular rhythm w/o murmurs, rubs or gallops Abdomen: soft, non-tender; bowel sounds normal; no masses,  no organomegaly Extremities: extremities normal, atraumatic, no cyanosis or edema  Assessment & Plan:  See encounters tab for problem based medical decision making. Patient seen with Dr. Angelia Mould  Signed: Ophelia Shoulder, MD 02/04/2016, 4:16 PM  Pager: [phone]

## 2016-02-05 ENCOUNTER — Observation Stay (HOSPITAL_COMMUNITY)
Admission: EM | Admit: 2016-02-05 | Discharge: 2016-02-07 | Disposition: A | Discharge status: Home / Self Care | Payer: Medicare Other | Attending: Internal Medicine | Admitting: Internal Medicine

## 2016-02-05 ENCOUNTER — Other Ambulatory Visit: Payer: Self-pay

## 2016-02-05 ENCOUNTER — Encounter (HOSPITAL_COMMUNITY): Payer: Self-pay

## 2016-02-05 ENCOUNTER — Emergency Department (HOSPITAL_COMMUNITY): Payer: Medicare Other

## 2016-02-05 DIAGNOSIS — E1122 Type 2 diabetes mellitus with diabetic chronic kidney disease: Secondary | ICD-10-CM | POA: Insufficient documentation

## 2016-02-05 DIAGNOSIS — E785 Hyperlipidemia, unspecified: Secondary | ICD-10-CM | POA: Insufficient documentation

## 2016-02-05 DIAGNOSIS — R001 Bradycardia, unspecified: Secondary | ICD-10-CM | POA: Diagnosis not present

## 2016-02-05 DIAGNOSIS — N189 Chronic kidney disease, unspecified: Secondary | ICD-10-CM | POA: Diagnosis not present

## 2016-02-05 DIAGNOSIS — I160 Hypertensive urgency: Secondary | ICD-10-CM

## 2016-02-05 DIAGNOSIS — Z79899 Other long term (current) drug therapy: Secondary | ICD-10-CM | POA: Diagnosis not present

## 2016-02-05 DIAGNOSIS — I499 Cardiac arrhythmia, unspecified: Secondary | ICD-10-CM | POA: Diagnosis not present

## 2016-02-05 DIAGNOSIS — Z7901 Long term (current) use of anticoagulants: Secondary | ICD-10-CM | POA: Insufficient documentation

## 2016-02-05 DIAGNOSIS — N184 Chronic kidney disease, stage 4 (severe): Secondary | ICD-10-CM | POA: Diagnosis present

## 2016-02-05 DIAGNOSIS — Z8673 Personal history of transient ischemic attack (TIA), and cerebral infarction without residual deficits: Secondary | ICD-10-CM | POA: Diagnosis not present

## 2016-02-05 DIAGNOSIS — Z7984 Long term (current) use of oral hypoglycemic drugs: Secondary | ICD-10-CM | POA: Insufficient documentation

## 2016-02-05 DIAGNOSIS — I129 Hypertensive chronic kidney disease with stage 1 through stage 4 chronic kidney disease, or unspecified chronic kidney disease: Secondary | ICD-10-CM | POA: Diagnosis not present

## 2016-02-05 DIAGNOSIS — I10 Essential (primary) hypertension: Secondary | ICD-10-CM

## 2016-02-05 HISTORY — DX: Cerebral infarction, unspecified: I63.9

## 2016-02-05 HISTORY — DX: Gout, unspecified: M10.9

## 2016-02-05 HISTORY — DX: Chronic kidney disease, stage 3 (moderate): N18.3

## 2016-02-05 HISTORY — DX: Type 2 diabetes mellitus without complications: E11.9

## 2016-02-05 HISTORY — DX: Chronic kidney disease, stage 3 unspecified: N18.30

## 2016-02-05 LAB — BMP8+ANION GAP
Anion Gap: 14 mmol/L (ref 10.0–18.0)
BUN/Creatinine Ratio: 11 — ABNORMAL LOW (ref 12–28)
BUN: 23 mg/dL (ref 8–27)
CALCIUM: 9.1 mg/dL (ref 8.7–10.3)
CHLORIDE: 102 mmol/L (ref 96–106)
CO2: 25 mmol/L (ref 18–29)
Creatinine, Ser: 2.05 mg/dL — ABNORMAL HIGH (ref 0.57–1.00)
GFR, EST AFRICAN AMERICAN: 25 mL/min/{1.73_m2} — AB (ref >59)
GFR, EST NON AFRICAN AMERICAN: 21 mL/min/{1.73_m2} — AB (ref >59)
GLUCOSE: 135 mg/dL — AB (ref 65–99)
POTASSIUM: 4.9 mmol/L (ref 3.5–5.2)
SODIUM: 141 mmol/L (ref 134–144)

## 2016-02-05 LAB — CBC WITH DIFFERENTIAL/PLATELET
BASOS ABS: 0.1 10*3/uL (ref 0.0–0.1)
BASOS PCT: 1 %
EOS ABS: 0.2 10*3/uL (ref 0.0–0.7)
EOS PCT: 4 %
HCT: 30.9 % — ABNORMAL LOW (ref 36.0–46.0)
HEMOGLOBIN: 10 g/dL — AB (ref 12.0–15.0)
Lymphocytes Relative: 40 %
Lymphs Abs: 1.8 10*3/uL (ref 0.7–4.0)
MCH: 30.6 pg (ref 26.0–34.0)
MCHC: 32.4 g/dL (ref 30.0–36.0)
MCV: 94.5 fL (ref 78.0–100.0)
MONO ABS: 0.5 10*3/uL (ref 0.1–1.0)
MONOS PCT: 11 %
NEUTROS ABS: 2 10*3/uL (ref 1.7–7.7)
Neutrophils Relative %: 44 %
Platelets: 192 10*3/uL (ref 150–400)
RBC: 3.27 MIL/uL — ABNORMAL LOW (ref 3.87–5.11)
RDW: 13.4 % (ref 11.5–15.5)
WBC: 4.5 10*3/uL (ref 4.0–10.5)

## 2016-02-05 LAB — COMPREHENSIVE METABOLIC PANEL
ALT: 14 U/L (ref 14–54)
ANION GAP: 7 (ref 5–15)
AST: 27 U/L (ref 15–41)
Albumin: 3.1 g/dL — ABNORMAL LOW (ref 3.5–5.0)
Alkaline Phosphatase: 77 U/L (ref 38–126)
BILIRUBIN TOTAL: 0.9 mg/dL (ref 0.3–1.2)
BUN: 28 mg/dL — ABNORMAL HIGH (ref 6–20)
CALCIUM: 8.5 mg/dL — AB (ref 8.9–10.3)
CO2: 22 mmol/L (ref 22–32)
Chloride: 106 mmol/L (ref 101–111)
Creatinine, Ser: 2.39 mg/dL — ABNORMAL HIGH (ref 0.44–1.00)
GFR calc non Af Amer: 17 mL/min — ABNORMAL LOW (ref >60)
GFR, EST AFRICAN AMERICAN: 20 mL/min — AB (ref >60)
Glucose, Bld: 188 mg/dL — ABNORMAL HIGH (ref 65–99)
POTASSIUM: 5.2 mmol/L — AB (ref 3.5–5.1)
SODIUM: 135 mmol/L (ref 135–145)
TOTAL PROTEIN: 5.7 g/dL — AB (ref 6.5–8.1)

## 2016-02-05 LAB — GLUCOSE, CAPILLARY
GLUCOSE-CAPILLARY: 240 mg/dL — AB (ref 65–99)
Glucose-Capillary: 222 mg/dL — ABNORMAL HIGH (ref 65–99)

## 2016-02-05 LAB — I-STAT TROPONIN, ED: Troponin i, poc: 0 ng/mL (ref 0.00–0.08)

## 2016-02-05 LAB — PROTIME-INR
INR: 2.97 — AB (ref 0.00–1.49)
Prothrombin Time: 30.4 seconds — ABNORMAL HIGH (ref 11.6–15.2)

## 2016-02-05 LAB — TSH: TSH: 2.703 u[IU]/mL (ref 0.350–4.500)

## 2016-02-05 LAB — TROPONIN I: Troponin I: <0.03 ng/mL (ref <0.03)

## 2016-02-05 MED ORDER — HYDRALAZINE HCL 20 MG/ML IJ SOLN: 10.0000 mg | INTRAMUSCULAR | Status: DC | PRN

## 2016-02-05 MED ORDER — CLONIDINE HCL 0.3 MG/24HR TD PTWK: 0.3000 mg | MEDICATED_PATCH | TRANSDERMAL | Status: DC

## 2016-02-05 MED ORDER — GLUCAGON HCL RDNA (DIAGNOSTIC) 1 MG IJ SOLR
5.0000 mg | Freq: Once | INTRAVENOUS | Status: AC
  Administered 2016-02-05: 5 mg via INTRAVENOUS
  Filled 2016-02-05: qty 5

## 2016-02-05 MED ORDER — LATANOPROST 0.005 % OP SOLN
1.0000 [drp] | Freq: Every day | OPHTHALMIC | Status: DC
  Administered 2016-02-05 – 2016-02-06 (×2): 1 [drp] via OPHTHALMIC
  Filled 2016-02-05: qty 2.5

## 2016-02-05 MED ORDER — AMLODIPINE BESYLATE 10 MG PO TABS: 10.0000 mg | ORAL_TABLET | Freq: Every day | ORAL | Status: DC

## 2016-02-05 MED ORDER — SODIUM CHLORIDE 0.9% FLUSH
3.0000 mL | Freq: Two times a day (BID) | INTRAVENOUS | Status: DC
  Administered 2016-02-05 – 2016-02-06 (×3): 3 mL via INTRAVENOUS

## 2016-02-05 MED ORDER — WARFARIN SODIUM 5 MG PO TABS
5.0000 mg | ORAL_TABLET | Freq: Once | ORAL | Status: AC
  Administered 2016-02-05: 5 mg via ORAL
  Filled 2016-02-05: qty 1

## 2016-02-05 MED ORDER — ALLOPURINOL 100 MG PO TABS
150.0000 mg | ORAL_TABLET | Freq: Every day | ORAL | Status: DC
  Administered 2016-02-05 – 2016-02-07 (×3): 150 mg via ORAL
  Filled 2016-02-05 (×3): qty 2

## 2016-02-05 MED ORDER — INSULIN ASPART 100 UNIT/ML ~~LOC~~ SOLN
0.0000 [IU] | Freq: Three times a day (TID) | SUBCUTANEOUS | Status: DC
  Administered 2016-02-05: 3 [IU] via SUBCUTANEOUS
  Administered 2016-02-06: 1 [IU] via SUBCUTANEOUS

## 2016-02-05 MED ORDER — DORZOLAMIDE HCL-TIMOLOL MAL 2-0.5 % OP SOLN
1.0000 [drp] | Freq: Two times a day (BID) | OPHTHALMIC | Status: DC
  Administered 2016-02-05 – 2016-02-07 (×5): 1 [drp] via OPHTHALMIC
  Filled 2016-02-05: qty 10

## 2016-02-05 MED ORDER — LOSARTAN POTASSIUM 50 MG PO TABS: 100.0000 mg | ORAL_TABLET | Freq: Every day | ORAL | Status: DC

## 2016-02-05 MED ORDER — CALCIUM CARBONATE-VITAMIN D 500-200 MG-UNIT PO TABS
1.0000 | ORAL_TABLET | Freq: Two times a day (BID) | ORAL | Status: DC
Start: 2016-02-05 — End: 2016-02-07
  Administered 2016-02-05 – 2016-02-07 (×4): 1 via ORAL
  Filled 2016-02-05 (×4): qty 1

## 2016-02-05 MED ORDER — AMLODIPINE BESYLATE 5 MG PO TABS
5.0000 mg | ORAL_TABLET | Freq: Every day | ORAL | Status: DC
  Administered 2016-02-05 – 2016-02-07 (×3): 5 mg via ORAL
  Filled 2016-02-05 (×3): qty 1

## 2016-02-05 MED ORDER — WARFARIN - PHARMACIST DOSING INPATIENT
Freq: Every day | Status: DC
  Administered 2016-02-05 – 2016-02-06 (×2)

## 2016-02-05 MED ORDER — SENNOSIDES-DOCUSATE SODIUM 8.6-50 MG PO TABS: 2.0000 | ORAL_TABLET | Freq: Every evening | ORAL | Status: DC | PRN

## 2016-02-05 MED ORDER — DEXTROSE 5 % IV SOLN
1.0000 mg/h | INTRAVENOUS | Status: DC
Start: 2016-02-05 — End: 2016-02-06
  Administered 2016-02-05: 1 mg/h via INTRAVENOUS
  Filled 2016-02-05: qty 5

## 2016-02-05 MED ORDER — INSULIN ASPART 100 UNIT/ML ~~LOC~~ SOLN
0.0000 [IU] | Freq: Every day | SUBCUTANEOUS | Status: DC
  Administered 2016-02-05: 2 [IU] via SUBCUTANEOUS
  Administered 2016-02-06: 3 [IU] via SUBCUTANEOUS

## 2016-02-05 NOTE — Progress Notes (Signed)
Internal Medicine Clinic Attending  I saw and evaluated the patient.  I personally confirmed the key portions of the history and exam documented by Dr. Lovena Le and I reviewed pertinent patient test results.  The assessment, diagnosis, and plan were formulated together and I agree with the documentation in the resident's note. We stressed improtance on taking medication with Chelsea Mcdaniel and her daughter, however since her nephrologist started her Metoprolol succinate 100mg  daily one month ago she appears to maintain a HR in the upper 40s-50s.  While she does have long standing hypertension, given her age and other medications (notably diltiazem) we fill this medication is too high risk to continue and will discontinue the medication.

## 2016-02-05 NOTE — H&P (Signed)
Date: 02/05/2016               Patient Name:  Chelsea Mcdaniel MRN: [id number]  DOB: 10-24-1927 Age / Sex: 82 y.o., female   PCP: Riccardo Dubin, MD         Medical Service: Internal Medicine Teaching Service         Attending Physician: Dr. Eppie Gibson    First Contact: Melissa Montane, MS4 Pager: [phone]  Second Contact: Dr. Arcelia Jew Pager: [phone]       After Hours (After 5p/  First Contact Pager: [phone]  weekends / holidays): Second Contact Pager: [phone]   Chief Complaint: Dizziness, slow HR  History of Present Illness: Chelsea Mcdaniel is a 82yo woman with PMHx of HTN, hyperlipidemia, DVTs on coumadin, type 2 DM, prior CVAs, and CKD stage 3 who presents today after home aide found that her HR was low in the 30s-40s. Patient reports feeling dizzy when getting up and walking around. She notes feeling "dizzy in the head" and "wobbly." She denies losing consciousness or vertigo. She was actually seen in the ED on 7/9 and bradycardic at 50 and then was seen in the [facility] yesterday and noted to be bradycardic in the 40s at that time. The patient and her daughter, who takes care of her medications, were recommended to stop her Metoprolol. Daughter states her last dose of Metoprolol was yesterday morning (7/12). Patient denies any headaches, chest pain, SOB, diaphoresis, nausea, vomiting, cough, dysuria, or fevers. Her daughter reports the patient has been more confused over the last few months by "forgetting things" such as what she needs at the grocery store or names of things.   Meds: No current facility-administered medications for this encounter.   Current Outpatient Prescriptions  Medication Sig Dispense Refill  . ACCU-CHEK AVIVA PLUS test strip Use 1 time daily to check blood sugar. 100 each 11  . ACCU-CHEK SOFTCLIX LANCETS lancets Use 1 time daily to check blood sugar. 100 each 3  . acetaminophen (TYLENOL) 325 MG tablet Take 2 tablets (650 mg total) by mouth every 6 (six) hours as needed.  (Patient taking differently: Take 650 mg by mouth every 6 (six) hours as needed for moderate pain. )    . allopurinol (ZYLOPRIM) 100 MG tablet Take 1.5 tablets (150 mg total) by mouth daily. 90 tablet 3  . Blood Glucose Monitoring Suppl (ACCU-CHEK AVIVA) device Use as instructed 1 each 0  . Calcium Carbonate-Vitamin D 600-400 MG-UNIT per tablet Take 1 tablet by mouth 2 (two) times daily.    . cloNIDine (CATAPRES - DOSED IN MG/24 HR) 0.3 mg/24hr patch Place 1 patch onto the skin every Thursday.  6  . colchicine 0.6 MG tablet Take 0.5 tablets (0.3 mg total) by mouth daily. 30 tablet 3  . diltiazem (CARDIZEM SR) 60 MG 12 hr capsule Take 1 capsule (60 mg total) by mouth 2 (two) times daily. 60 capsule 11  . dorzolamide-timolol (COSOPT) 22.3-6.8 MG/ML ophthalmic solution Place 1 drop into both eyes 2 (two) times daily.    Marland Kitchen JANUVIA 25 MG tablet TAKE 1 TABLET BY MOUTH EVERY DAY 90 tablet 1  . losartan (COZAAR) 100 MG tablet Take 1 tablet (100 mg total) by mouth daily. 30 tablet 5  . LUMIGAN 0.01 % SOLN Place 1 drop into both eyes every morning.     . magnesium hydroxide (MILK OF MAGNESIA) 400 MG/5ML suspension Take 30 mLs by mouth daily as needed for mild constipation or indigestion.    Marland Kitchen  metoprolol succinate (TOPROL-XL) 100 MG 24 hr tablet Take 100 mg by mouth daily. Take with or immediately following a meal.    . metoprolol succinate (TOPROL-XL) 25 MG 24 hr tablet TAKE ONE-HALF TABLET BY MOUTH EVERY DAY 45 tablet 3  . senna-docusate (SENOKOT-S) 8.6-50 MG tablet Take 2 tablets by mouth at bedtime as needed for mild constipation. 60 tablet 2  . Sennosides (EX-LAX PO) Take 5 mLs by mouth daily as needed (for laxative).    . simethicone (GAS-X) 80 MG chewable tablet Chew 1 tablet (80 mg total) by mouth every 6 (six) hours as needed for flatulence. 30 tablet 0  . warfarin (COUMADIN) 5 MG tablet Take 5-7.5 mg by mouth See admin instructions. 5 mg on Monday Wednesday Friday. 7.5 mg all other days.    Marland Kitchen  warfarin (COUMADIN) 5 MG tablet Take one pill daily except take one half pill (2.5mg ) on Mondays and Thursdays (Patient taking differently: Take 5-7.5 mg by mouth every morning. Takes 7.5mg  on mon, thurs, fri Takes 5 mg all other days) 30 tablet 3    Allergies: Allergies as of 02/05/2016  . (No Known Allergies)   Past Medical History  Diagnosis Date  . Diabetes mellitus   . Hyperlipidemia   . Hypertension   . CVA (cerebrovascular accident) [facility])  October 2007     CT of the head atrophy with multiple remote insults noted but no definite acute findings  . Blindness of left eye      likely related to stroke, left cataract removed from that eye with complications  . Anemia      normocytic anemia with baseline hemoglobin 10-11  . Chronic kidney disease 2006     left renal artery stenosis with probable hemodynamic significance, kidneys are normal in morphology without focal lesions or hydronephrosis this is based but cannot A. of the abdomen with and without contrast done on the 31st 2006  . Glaucoma   . Personal history of DVT (deep vein thrombosis) 08/11/2010  . Calculus gallbladder and bile duct with cholecystitis with obstruction 01/2015    Family History: Mother- heart disease, DM, HTN, hyperlipidemia. Father- DM, heart disease, HTN.  Social History: Widowed. Had 11 children, 5 living. Never smoker. No alcohol use. No drug use. Lives at home with daughter.   Review of Systems: A complete ROS was negative except as per HPI.   Physical Exam: Blood pressure 153/59, pulse 37, temperature 98.1 F (36.7 C), temperature source Oral, resp. rate 20, height 5\' 4"  (1.626 m), SpO2 96 %. General: thin elderly woman sitting up in bed, NAD, pleasant  HEENT: [place]/AT, EOMI, sclera anicteric, mucus membranes moist CV: Bradycardic in 30s-40s. Irregularly irregular rhythm. No murmurs appreciated. Pulm: CTA bilaterally, breaths non-labored  Abd: BS+, soft, non-tender Ext: warm, no edema Neuro: alert  and oriented to person and place  EKG: Junctional rhythm. New RBBB and LPFB. HR 39.   CXR: Cardiomegaly. No other acute abnormalities.   Assessment & Plan by Problem:  Symptomatic Bradycardia: She presented dizziness found to have a HR in the 30s-40s. Her EKG on admission shows a new RBBB and LPFB, which was not present on her EKG from four days ago.There is concern for ischemia as the cause of her bradycardia. Her initial troponin was negative. She is not complaining on any chest pain, SOB, nausea/vomiting or diaphoresis. Cardiology was consulted and have started her on a glucagon gtt while awaiting "wash out" of her meds. Will check echocardiogram. It is unclear whether patient  discontinued the metoprolol after her clinic visit on 7/12, however her bradycardia could be secondary to medication as well as her home Diltiazem. We will discontinue her home metoprolol and diltiazam until we elucidate the cause of her bradycardia. We do not believe her bradycardia is 2/2 infection because patient elicits no symptoms that would suggest UTI, pneumonia or other infection, and she has no fever or elevated WBC.  - Discontinue metoprolol and diltiazam  - Cardiology consulted, appreciate recs - Continue glucagon gtt - On telemetry - Trend trops - Repeat EKG in AM - Check TSH>>normal - Check bmet and Mg in AM  HTN: Patient's home BP log revealed that her baseline systolics are [id number] even on home diltiazam and metoprolol. We will try to reduce blood pressure slowly in the outpatient setting to reduce risk of stroke and ACS. - d/c metoprolol and diltiazam  - Start amlodipine 5mg  - Continue home Clonidine 0.3 mg patch - Hydralazine PRN SBP >180  Type 2 DM: Last A1c 6.4 in June 2017. She is on Januvia at home. - Hold home Januvia - Start sensitive ISS  Constipation: - Continue senokot 2 8.6-50 mg tablets at night  History of DVTs: - Warfarin per pharmacy - PT/ INR in am  Gout: - Continue  home Allopurinol    Diet: Heart healthy DVT PPx: Warfarin  Dispo: Admit patient to Observation with expected length of stay less than 2 midnights.  Signed: Juliet Rude, MD 02/05/2016, 1:16 PM  Pager: [phone]

## 2016-02-05 NOTE — H&P (Signed)
Date: 02/05/2016               Patient Name:  Chelsea Mcdaniel MRN: [id number]  DOB: 12/16/1927 Age / Sex: 82 y.o., female   PCP: Riccardo Dubin, MD              Medical Service: Internal Medicine Teaching Service              Attending Physician: Dr. Oval Linsey, MD    First Contact: Melissa Montane, MS 4 Pager: [phone]  Second Contact: Dr. Albin Felling Pager: [phone]            After Hours (After 5p/  First Contact Pager: [phone]  weekends / holidays): Second Contact Pager: [phone]   Chief Complaint:  Health aid had patient admitted for bradycardia  History of Present Illness:  [name] is an 82yo F with a hx of DM, HTN, CVA and CKD who presents to the ED for symptomatic bradycardia that her health aid at home noted on a routine exam.  Patient endorsed dizziness, or lightheadedness, upon standing to eat breakfast earlier in the day, but she denies syncope.  She denies chest pain, nausea/vomiting, diaphoresis or SOB.  Patient is alert to person and place, but not time.  Daughter in law is in the room and states that patient is pretty much at baseline, as she has had a gradual cognitive decline over the past couple months with increasing forgetfulness and misplacing things. Patient also states that she "feels fine."  Patient presented to the ED on 02/01/16, 4 days ago, for hypertensive urgency with systolics in the [id number] and bradycardia that was asymptomatic.  They believed this was due to medication noncompliance since patient forgets to take medications occasionally.  They d/c'ed her metoprolol XR because they felt she was at fall risk.  Patient is unsure of whether she actually discontinued this medication, but she had not taken any medication today before she came to the hospital.  Patient's BP log shows baseline systolics are in the 123XX123 range.    Patient is chronically anticoagulated for history of unprovoked DVT with an INR of 2.05 4 days ago.  She endorses 40 pounds of weight loss  in the past year, and believes this is from not being able to eat with dentures. She denies night sweats. Family history significant for heat attack in brother and one of her children.  Patient denies any h/o of smoking or drinking.   She denies any fever, dysuria, polyuria, hemoglobinuria, bloody stools, vomiting, cough or headache.  Meds: Current Facility-Administered Medications  Medication Dose Route Frequency Provider Last Rate Last Dose  . cloNIDine (CATAPRES - Dosed in mg/24 hr) patch 0.3 mg  0.3 mg Transdermal Q Thu Carly J Rivet, MD      . losartan (COZAAR) tablet 100 mg  100 mg Oral Daily Carly Montey Hora, MD       Current Outpatient Prescriptions  Medication Sig Dispense Refill  . ACCU-CHEK AVIVA PLUS test strip Use 1 time daily to check blood sugar. 100 each 11  . ACCU-CHEK SOFTCLIX LANCETS lancets Use 1 time daily to check blood sugar. 100 each 3  . acetaminophen (TYLENOL) 325 MG tablet Take 2 tablets (650 mg total) by mouth every 6 (six) hours as needed. (Patient taking differently: Take 650 mg by mouth every 6 (six) hours as needed for moderate pain. )    . allopurinol (ZYLOPRIM) 100 MG tablet Take 1.5 tablets (150 mg total) by mouth daily. [place]  tablet 3  . Blood Glucose Monitoring Suppl (ACCU-CHEK AVIVA) device Use as instructed 1 each 0  . Calcium Carbonate-Vitamin D 600-400 MG-UNIT per tablet Take 1 tablet by mouth 2 (two) times daily.    . cloNIDine (CATAPRES - DOSED IN MG/24 HR) 0.3 mg/24hr patch Place 1 patch onto the skin every Thursday.  6  . colchicine 0.6 MG tablet Take 0.5 tablets (0.3 mg total) by mouth daily. 30 tablet 3  . diltiazem (CARDIZEM SR) 60 MG 12 hr capsule Take 1 capsule (60 mg total) by mouth 2 (two) times daily. 60 capsule 11  . dorzolamide-timolol (COSOPT) 22.3-6.8 MG/ML ophthalmic solution Place 1 drop into both eyes 2 (two) times daily.    Marland Kitchen JANUVIA 25 MG tablet TAKE 1 TABLET BY MOUTH EVERY DAY 90 tablet 1  . losartan (COZAAR) 100 MG tablet Take 1 tablet  (100 mg total) by mouth daily. 30 tablet 5  . LUMIGAN 0.01 % SOLN Place 1 drop into both eyes every morning.     . magnesium hydroxide (MILK OF MAGNESIA) 400 MG/5ML suspension Take 30 mLs by mouth daily as needed for mild constipation or indigestion.    . metoprolol succinate (TOPROL-XL) 100 MG 24 hr tablet Take 100 mg by mouth daily. Take with or immediately following a meal.    . metoprolol succinate (TOPROL-XL) 25 MG 24 hr tablet TAKE ONE-HALF TABLET BY MOUTH EVERY DAY 45 tablet 3  . senna-docusate (SENOKOT-S) 8.6-50 MG tablet Take 2 tablets by mouth at bedtime as needed for mild constipation. 60 tablet 2  . Sennosides (EX-LAX PO) Take 5 mLs by mouth daily as needed (for laxative).    . simethicone (GAS-X) 80 MG chewable tablet Chew 1 tablet (80 mg total) by mouth every 6 (six) hours as needed for flatulence. 30 tablet 0  . warfarin (COUMADIN) 5 MG tablet Take 5-7.5 mg by mouth See admin instructions. 5 mg on Monday Wednesday Friday. 7.5 mg all other days.    Marland Kitchen warfarin (COUMADIN) 5 MG tablet Take one pill daily except take one half pill (2.5mg ) on Mondays and Thursdays (Patient taking differently: Take 5-7.5 mg by mouth every morning. Takes 7.5mg  on mon, thurs, fri Takes 5 mg all other days) 30 tablet 3    Allergies: Allergies as of 02/05/2016  . (No Known Allergies)   Past Medical History  Diagnosis Date  . Diabetes mellitus   . Hyperlipidemia   . Hypertension   . CVA (cerebrovascular accident) [facility])  October 2007     CT of the head atrophy with multiple remote insults noted but no definite acute findings  . Blindness of left eye      likely related to stroke, left cataract removed from that eye with complications  . Anemia      normocytic anemia with baseline hemoglobin 10-11  . Chronic kidney disease 2006     left renal artery stenosis with probable hemodynamic significance, kidneys are normal in morphology without focal lesions or hydronephrosis this is based but cannot A. of the  abdomen with and without contrast done on the 31st 2006  . Glaucoma   . Personal history of DVT (deep vein thrombosis) 08/11/2010  . Calculus gallbladder and bile duct with cholecystitis with obstruction 01/2015   Past Surgical History  Procedure Laterality Date  . Transthoracic echocardiogram   May 2008     left ventricular systolic function normal EF estimated range of 55-60%, no definite diagnostic evidence of left ventricular regional wall motion abnormalities, Doppler  parameters consistent with abnormal left ventricular relaxation, findings suggestive of possible bicuspid aortic valve and aortic valve thickness mildly increase  . Abdominal hysterectomy    . Eye surgery     Family History  Problem Relation Age of Onset  . Heart disease Mother   . Diabetes Mother   . Hyperlipidemia Mother   . Hypertension Mother   . Heart disease Father   . Diabetes Father   . Hyperlipidemia Father   . Hypertension Father    Social History   Social History  . Marital Status: Widowed    Spouse Name: N/A  . Number of Children: N/A  . Years of Education: N/A   Occupational History  . Not on file.   Social History Main Topics  . Smoking status: Never Smoker   . Smokeless tobacco: Never Used  . Alcohol Use: No  . Drug Use: No  . Sexual Activity: No   Other Topics Concern  . Not on file   Social History Narrative    Patient is a widow. She has 11 children 5 of who are living. She is retired in 1993 from [employer]. She denies tobacco alcohol or drug use.    Review of Systems: Constitutional: positive for weight loss Eyes: positive for cataracts, negative for icterus and irritation Respiratory:  negative for dyspnea on exertion, hemoptysis, pleurisy/chest pain and sputum Cardiovascular: negative for chest pain, exertional chest pressure/discomfort, fatigue, irregular heart beat, lower extremity edema and paroxysmal nocturnal dyspnea Gastrointestinal: negative for abdominal pain,  diarrhea, melena, nausea and vomiting Genitourinary:negative for dysuria, frequency and hematuria Musculoskeletal:negative for arthralgias and back pain Neurological: negative for coordination problems, gait problems, headaches and seizures   Physical Exam: Blood pressure 167/70, pulse 41, temperature 98.1 F (36.7 C), temperature source Oral, resp. rate 18, height 5\' 4"  (1.626 m), SpO2 98 %.   Gen: elderly laying in bed in NAD Head: AT/[place] CV: bradycardic, irregular rhythm, no murmurs noted Lungs: CTAB Abd: soft, bowel sounds present, no masses, some tenderness in the upper right quadrant on deep palpation Ext: no cyanosis or edema noted Skin: no rashes, bruises or lesions noted Neuro: CN II-XII intact, strength intact and symmetric in upper and lower ext.  Lab results: Troponin [facility] Test)  Recent Labs  02/05/16 1123  TROPIPOC 0.00   CMP Latest Ref Rng 02/05/2016 02/04/2016 02/01/2016  Glucose 65 - 99 mg/dL 188(H) 135(H) 188(H)  BUN 6 - 20 mg/dL 28(H) 23 27(H)  Creatinine 0.44 - 1.00 mg/dL 2.39(H) 2.05(H) 2.31(H)  Sodium 135 - 145 mmol/L 135 141 136  Potassium 3.5 - 5.1 mmol/L 5.2(H) 4.9 4.7  Chloride 101 - 111 mmol/L 106 102 106  CO2 22 - 32 mmol/L 22 25 23   Calcium 8.9 - 10.3 mg/dL 8.5(L) 9.1 8.7(L)  Total Protein 6.5 - 8.1 g/dL 5.7(L) - -  Total Bilirubin 0.3 - 1.2 mg/dL 0.9 - -  Alkaline Phos 38 - 126 U/L 77 - -  AST 15 - 41 U/L 27 - -  ALT 14 - 54 U/L 14 - -   CBC Latest Ref Rng 02/05/2016 02/01/2016 10/06/2015  WBC 4.0 - 10.5 K/uL 4.5 4.9 5.2  Hemoglobin 12.0 - 15.0 g/dL 10.0(L) 9.8(L) -  Hematocrit 36.0 - 46.0 % 30.9(L) 31.0(L) 33.9(L)  Platelets 150 - 400 K/uL 192 235 323    .   Imaging results:  Dg Chest Portable 1 View  02/05/2016  CLINICAL DATA:  82 year old female with bradycardia. EXAM: PORTABLE CHEST 1 VIEW COMPARISON:  11/17/2015 and prior chest radiographs FINDINGS: Cardiomegaly and mild peribronchial thickening again noted. There is no evidence  of focal airspace disease, pulmonary edema, suspicious pulmonary nodule/mass, pleural effusion, or pneumothorax. No acute bony abnormalities are identified. Surgical clips overlying the right neck again noted. IMPRESSION: Cardiomegaly without evidence of acute cardiopulmonary disease. Electronically Signed   By: Margarette Canada M.D.   On: 02/05/2016 12:11    Other results:   Assessment & Plan by Problem:  Ms. Vogt is an 82 yo F who presents to the ED with HTN and bradycardia noted by her home health aid.  Bradycardia:  Patients was dizzy or lightheaded earlier in the day suggesting she is symptomatic from her bradycardia.  Her EKG on admission shows a RBBB and left left posterior fascicular block with ST depressions in the inferior leads, which was not present on her EKG from four days ago.  Her troponin was 0 on admission and she is not complaining on any chest pain, SOB, nausea/vomiting or diaphoresis. Cardiology was consulted due to patients complex EKG and our concern for possible ischemia.  It is unclear whether patient discontinued the metoprolol after her ED visit on 7/9, however her bradycardia could be secondary to medication. We will discontinue patient's metoprolol and diltiazam until we elucidate the cause of her bradycardia. We do not believe her bradycardia is 2/2 infection because patient elicits no symptoms that would suggest UTI, pneumonia or other infection, and she has no fever or elevated WBC.  -consulted cardiology, appreciate recs -telemetry -possible ischemia, trend trops q6h -repeat EKG  -discontinue metoprolol and diltiazam during admission -CBC and BMP in am -TSH -glucagon 5mg  IV, then 1mg /hr IV   Hypertension: Patients log revealed that her baseline systolics are 123XX123 even on home diltiazam and metoprolol.  We will try to reduce blood pressure slowly in the outpatient setting to reduce risk of stroke and ACS. -d/c metoprolol and diltiazam  -amlodipine  5mg  -hydaralazine 10mg  injection  Diabetes Mellitus type 2:  -Novalog 0-5 U at bedtime and 0-9 U with meals  Constipation: -senokot 2 8.6-50 mg tablets at night  History of DVT: -warfarin per pharmacy -protime INR in am  Gout: -colchicine .6mg    This is a Careers information officer Note.  The care of the patient was discussed with Dr. Arcelia Jew and the assessment and plan was formulated with their assistance.  Please see their note for official documentation of the patient encounter.   Signed: Elesa Hacker, Med Student 02/05/2016, 2:09 PM

## 2016-02-05 NOTE — ED Notes (Signed)
Pt presents with "woozy" feeling upon awakening this am.  Per EMS, pt was pale on their arrival, pt received 464mL of NS in route.  Pt was noted to be bradycardic.  Pt denies any pain, discomfort or shortness of breath.

## 2016-02-05 NOTE — ED Provider Notes (Signed)
CSN: [id number]     Arrival date & time 02/05/16  1039 History   First MD Initiated Contact with Patient 02/05/16 1047     Chief Complaint  Patient presents with  . Bradycardia      HPI  Patient presents for evaluation of "feeling woozy". She states that the last 24 hours whenever she sits or stands she feels like it is a she is going to pass out. This is relieved with laying supine. When recently seen and evaluated here, as well as an internal medicine residency clinic. Her instructions were 2 days ago were to hold her Lopressor. She is unable to tell me if she held it. She states she is taking her medicines like I'm supposed to". History of hypertension. Does take Cardizem, as well as Lopressor. None of these are new medications for her.  No fall or injury. No shortness of breath. No chest pain. No new edema, PND, orthopnea. Is more comfortably laying supine.  Past Medical History  Diagnosis Date  . Diabetes mellitus   . Hyperlipidemia   . Hypertension   . CVA (cerebrovascular accident) [facility])  October 2007     CT of the head atrophy with multiple remote insults noted but no definite acute findings  . Blindness of left eye      likely related to stroke, left cataract removed from that eye with complications  . Anemia      normocytic anemia with baseline hemoglobin 10-11  . Chronic kidney disease 2006     left renal artery stenosis with probable hemodynamic significance, kidneys are normal in morphology without focal lesions or hydronephrosis this is based but cannot A. of the abdomen with and without contrast done on the 31st 2006  . Glaucoma   . Personal history of DVT (deep vein thrombosis) 08/11/2010  . Calculus gallbladder and bile duct with cholecystitis with obstruction 01/2015   Past Surgical History  Procedure Laterality Date  . Transthoracic echocardiogram   May 2008     left ventricular systolic function normal EF estimated range of 55-60%, no definite diagnostic evidence  of left ventricular regional wall motion abnormalities, Doppler parameters consistent with abnormal left ventricular relaxation, findings suggestive of possible bicuspid aortic valve and aortic valve thickness mildly increase  . Abdominal hysterectomy    . Eye surgery     Family History  Problem Relation Age of Onset  . Heart disease Mother   . Diabetes Mother   . Hyperlipidemia Mother   . Hypertension Mother   . Heart disease Father   . Diabetes Father   . Hyperlipidemia Father   . Hypertension Father    Social History  Substance Use Topics  . Smoking status: Never Smoker   . Smokeless tobacco: Never Used  . Alcohol Use: No   OB History    No data available     Review of Systems  Constitutional: Negative for fever, chills, diaphoresis, appetite change and fatigue.  HENT: Negative for mouth sores, sore throat and trouble swallowing.   Eyes: Negative for visual disturbance.  Respiratory: Negative for cough, chest tightness, shortness of breath and wheezing.   Cardiovascular: Negative for chest pain.  Gastrointestinal: Negative for nausea, vomiting, abdominal pain, diarrhea and abdominal distention.  Endocrine: Negative for polydipsia, polyphagia and polyuria.  Genitourinary: Negative for dysuria, frequency and hematuria.  Musculoskeletal: Negative for gait problem.  Skin: Negative for color change, pallor and rash.  Neurological: Positive for dizziness and light-headedness. Negative for syncope and headaches.  Hematological:  Does not bruise/bleed easily.  Psychiatric/Behavioral: Negative for behavioral problems and confusion.      Allergies  Review of patient's allergies indicates no known allergies.  Home Medications   Prior to Admission medications   Medication Sig Start Date End Date Taking? Authorizing Provider  ACCU-CHEK AVIVA PLUS test strip Use 1 time daily to check blood sugar. 11/06/14  Yes Alex Ronnie Derby, DO  ACCU-CHEK SOFTCLIX LANCETS lancets Use 1 time  daily to check blood sugar. 07/02/15  Yes Francesca Oman, DO  acetaminophen (TYLENOL) 325 MG tablet Take 2 tablets (650 mg total) by mouth every 6 (six) hours as needed. Patient taking differently: Take 650 mg by mouth every 6 (six) hours as needed for moderate pain.  08/22/14  Yes Juluis Mire, MD  allopurinol (ZYLOPRIM) 100 MG tablet Take 1.5 tablets (150 mg total) by mouth daily. 10/07/15 10/06/16 Yes Loleta Chance, MD  Blood Glucose Monitoring Suppl (ACCU-CHEK AVIVA) device Use as instructed 04/06/15 04/05/16 Yes Alex Ronnie Derby, DO  Calcium Carbonate-Vitamin D 600-400 MG-UNIT per tablet Take 1 tablet by mouth 2 (two) times daily.   Yes Historical Provider, MD  cloNIDine (CATAPRES - DOSED IN MG/24 HR) 0.3 mg/24hr patch Place 1 patch onto the skin every Thursday. 01/29/16  Yes Historical Provider, MD  colchicine 0.6 MG tablet Take 0.5 tablets (0.3 mg total) by mouth daily. 09/24/15  Yes Loleta Chance, MD  diltiazem (CARDIZEM SR) 60 MG 12 hr capsule Take 1 capsule (60 mg total) by mouth 2 (two) times daily. 09/24/15  Yes Loleta Chance, MD  dorzolamide-timolol (COSOPT) 22.3-6.8 MG/ML ophthalmic solution Place 1 drop into both eyes 2 (two) times daily. 01/21/16  Yes Historical Provider, MD  JANUVIA 25 MG tablet TAKE 1 TABLET BY MOUTH EVERY DAY 01/22/16  Yes Riccardo Dubin, MD  losartan (COZAAR) 100 MG tablet Take 1 tablet (100 mg total) by mouth daily. 11/17/15 11/16/16 Yes Norman Herrlich, MD  LUMIGAN 0.01 % SOLN Place 1 drop into both eyes every morning.  12/05/13  Yes Historical Provider, MD  magnesium hydroxide (MILK OF MAGNESIA) 400 MG/5ML suspension Take 30 mLs by mouth daily as needed for mild constipation or indigestion.   Yes Historical Provider, MD  metoprolol succinate (TOPROL-XL) 100 MG 24 hr tablet Take 100 mg by mouth daily. Take with or immediately following a meal.   Yes Historical Provider, MD  metoprolol succinate (TOPROL-XL) 25 MG 24 hr tablet TAKE ONE-HALF TABLET BY MOUTH EVERY DAY 01/23/16  Yes Riccardo Dubin, MD  senna-docusate (SENOKOT-S) 8.6-50 MG tablet Take 2 tablets by mouth at bedtime as needed for mild constipation. 08/24/15  Yes Alex Ronnie Derby, DO  Sennosides (EX-LAX PO) Take 5 mLs by mouth daily as needed (for laxative).   Yes Historical Provider, MD  simethicone (GAS-X) 80 MG chewable tablet Chew 1 tablet (80 mg total) by mouth every 6 (six) hours as needed for flatulence. 01/08/15  Yes Corky Sox, MD  warfarin (COUMADIN) 5 MG tablet Take 5-7.5 mg by mouth See admin instructions. 5 mg on Monday Wednesday Friday. 7.5 mg all other days.   Yes Historical Provider, MD  warfarin (COUMADIN) 5 MG tablet Take one pill daily except take one half pill (2.5mg ) on Mondays and Thursdays Patient taking differently: Take 5-7.5 mg by mouth every morning. Takes 7.5mg  on mon, thurs, fri Takes 5 mg all other days 10/24/15   Bartholomew Crews, MD   BP 153/59 mmHg  Pulse 37  Temp(Src) 98.1 F (36.7 C) (  Oral)  Resp 20  Ht 5\' 4"  (1.626 m)  SpO2 96% Physical Exam  Constitutional: She is oriented to person, place, and time. She appears well-developed and well-nourished. No distress.  HENT:  Head: Normocephalic.  Eyes: Conjunctivae are normal. Pupils are equal, round, and reactive to light. No scleral icterus.  Neck: Normal range of motion. Neck supple. No thyromegaly present.  Cardiovascular: Exam reveals no gallop and no friction rub.   No murmur heard. Bradycardic in the mid 30s to low 40s. Wide-complex junctional rhythm on the monitor. Hypertensive 207/71.  Pulmonary/Chest: Effort normal and breath sounds normal. No respiratory distress. She has no wheezes. She has no rales.  Abdominal: Soft. Bowel sounds are normal. She exhibits no distension. There is no tenderness. There is no rebound.  Musculoskeletal: Normal range of motion.  Neurological: She is alert and oriented to person, place, and time.  Skin: Skin is warm and dry. No rash noted.  Psychiatric: She has a normal mood and affect. Her  behavior is normal.    ED Course  Procedures (including critical care time) Labs Review Labs Reviewed  CBC WITH DIFFERENTIAL/PLATELET - Abnormal; Notable for the following:    RBC 3.27 (*)    Hemoglobin 10.0 (*)    HCT 30.9 (*)    All other components within normal limits  COMPREHENSIVE METABOLIC PANEL - Abnormal; Notable for the following:    Potassium 5.2 (*)    Glucose, Bld 188 (*)    BUN 28 (*)    Creatinine, Ser 2.39 (*)    Calcium 8.5 (*)    Total Protein 5.7 (*)    Albumin 3.1 (*)    GFR calc non Af Amer 17 (*)    GFR calc Af Amer 20 (*)    All other components within normal limits  I-STAT TROPOININ, ED    Imaging Review Dg Chest Portable 1 View  02/05/2016  CLINICAL DATA:  82 year old female with bradycardia. EXAM: PORTABLE CHEST 1 VIEW COMPARISON:  11/17/2015 and prior chest radiographs FINDINGS: Cardiomegaly and mild peribronchial thickening again noted. There is no evidence of focal airspace disease, pulmonary edema, suspicious pulmonary nodule/mass, pleural effusion, or pneumothorax. No acute bony abnormalities are identified. Surgical clips overlying the right neck again noted. IMPRESSION: Cardiomegaly without evidence of acute cardiopulmonary disease. Electronically Signed   By: Margarette Canada M.D.   On: 02/05/2016 12:11   I have personally reviewed and evaluated these images and lab results as part of my medical decision-making.   EKG Interpretation   Date/Time:  Thursday February 05 2016 11:24:41 EDT Ventricular Rate:  36 PR Interval:    QRS Duration: 171 QT Interval:  554 QTC Calculation: 429 R Axis:   122 Text Interpretation:  Junctional rhythm RBBB and LPFB ST depr, consider  ischemia, inferior leads Baseline wander in lead(s) II III aVF Confirmed  by Jeneen Rinks  MD, [place] ([id number]) on 02/05/2016 1:32:23 PM      MDM   Final diagnoses:  Bradycardia    Patient with a junctional bradycardia. Continues to take Cardizem as well as Lopressor. This is not appear to  be third-degree block. She has intermittent sinus beats. However, his primary in a junctional escape rhythm. Is maintaining pressures in mentation well with this. I discussed the case with internal medicine residents. Plan will be admission. We'll hold Cardizem and Lopressor.    Tanna Furry, MD 02/05/16 [phone]

## 2016-02-05 NOTE — Consult Note (Signed)
CARDIOLOGY CONSULT NOTE   Patient ID: Chelsea Mcdaniel MRN: [id number] DOB/AGE: 01-06-1928 82 y.o.  Admit date: 02/05/2016  Primary Physician   Charlott Rakes, MD Primary Cardiologist: Reola Calkins Requesting MD: Dr. Jeneen Rinks Reason for Consultation: Bradycardia  HPI: Chelsea Mcdaniel is a 82 year old female with a past medical history of DM, HLD, HTN, CVA, and CKD.   She presents today to the ED with complaints of "feeling woozy". He has a caregiver that stays with her most of the day, and today the patient was lightheaded. The caregiver took her blood pressure with a home blood pressure machine and noted that her blood pressure was elevated and her heart rate was in the 40s. So she called EMS.  The patient states that beginning on Sunday (about 4 days ago), she began feeling lightheaded anytime that she stood up. Denies any chest pain, nausea or vomiting, diaphoresis, or shortness of breath. Her symptoms were relieved when she sat back down or lay down. She was seen in the ED on 02/01/2016 for hypertensive urgency and asymptomatic bradycardia. It was felt at that time that this was due to medication noncompliance and her metoprolol was discontinued because they felt she was a fall risk. Both the patient and the family member at the bedside are unsure if she has taken metoprolol this week. Also of note today are unsure if she's been taking her diltiazem.  Her EKG on arrival shows junctional rhythm with a rate of 39 with evidence of right and left partial fascicular block.  Past Medical History  Diagnosis Date  . Diabetes mellitus   . Hyperlipidemia   . Hypertension   . CVA (cerebrovascular accident) [facility])  October 2007     CT of the head atrophy with multiple remote insults noted but no definite acute findings  . Blindness of left eye      likely related to stroke, left cataract removed from that eye with complications  . Anemia      normocytic anemia with baseline hemoglobin 10-11  . Chronic kidney  disease 2006     left renal artery stenosis with probable hemodynamic significance, kidneys are normal in morphology without focal lesions or hydronephrosis this is based but cannot A. of the abdomen with and without contrast done on the 31st 2006  . Glaucoma   . Personal history of DVT (deep vein thrombosis) 08/11/2010  . Calculus gallbladder and bile duct with cholecystitis with obstruction 01/2015     Past Surgical History  Procedure Laterality Date  . Transthoracic echocardiogram   May 2008     left ventricular systolic function normal EF estimated range of 55-60%, no definite diagnostic evidence of left ventricular regional wall motion abnormalities, Doppler parameters consistent with abnormal left ventricular relaxation, findings suggestive of possible bicuspid aortic valve and aortic valve thickness mildly increase  . Abdominal hysterectomy    . Eye surgery      No Known Allergies  I have reviewed the patient's current medications . cloNIDine  0.3 mg Transdermal Q Thu  . losartan  100 mg Oral Daily       Prior to Admission medications   Medication Sig Start Date End Date Taking? Authorizing Provider  ACCU-CHEK AVIVA PLUS test strip Use 1 time daily to check blood sugar. 11/06/14  Yes Alex Ronnie Derby, DO  ACCU-CHEK SOFTCLIX LANCETS lancets Use 1 time daily to check blood sugar. 07/02/15  Yes Francesca Oman, DO  acetaminophen (TYLENOL) 325 MG tablet Take 2  tablets (650 mg total) by mouth every 6 (six) hours as needed. Patient taking differently: Take 650 mg by mouth every 6 (six) hours as needed for moderate pain.  08/22/14  Yes Juluis Mire, MD  allopurinol (ZYLOPRIM) 100 MG tablet Take 1.5 tablets (150 mg total) by mouth daily. 10/07/15 10/06/16 Yes Loleta Chance, MD  Blood Glucose Monitoring Suppl (ACCU-CHEK AVIVA) device Use as instructed 04/06/15 04/05/16 Yes Alex Ronnie Derby, DO  Calcium Carbonate-Vitamin D 600-400 MG-UNIT per tablet Take 1 tablet by mouth 2 (two) times daily.   Yes  Historical Provider, MD  cloNIDine (CATAPRES - DOSED IN MG/24 HR) 0.3 mg/24hr patch Place 1 patch onto the skin every Thursday. 01/29/16  Yes Historical Provider, MD  colchicine 0.6 MG tablet Take 0.5 tablets (0.3 mg total) by mouth daily. 09/24/15  Yes Loleta Chance, MD  diltiazem (CARDIZEM SR) 60 MG 12 hr capsule Take 1 capsule (60 mg total) by mouth 2 (two) times daily. 09/24/15  Yes Loleta Chance, MD  dorzolamide-timolol (COSOPT) 22.3-6.8 MG/ML ophthalmic solution Place 1 drop into both eyes 2 (two) times daily. 01/21/16  Yes Historical Provider, MD  JANUVIA 25 MG tablet TAKE 1 TABLET BY MOUTH EVERY DAY 01/22/16  Yes Riccardo Dubin, MD  losartan (COZAAR) 100 MG tablet Take 1 tablet (100 mg total) by mouth daily. 11/17/15 11/16/16 Yes Norman Herrlich, MD  LUMIGAN 0.01 % SOLN Place 1 drop into both eyes every morning.  12/05/13  Yes Historical Provider, MD  magnesium hydroxide (MILK OF MAGNESIA) 400 MG/5ML suspension Take 30 mLs by mouth daily as needed for mild constipation or indigestion.   Yes Historical Provider, MD  metoprolol succinate (TOPROL-XL) 100 MG 24 hr tablet Take 100 mg by mouth daily. Take with or immediately following a meal.   Yes Historical Provider, MD  metoprolol succinate (TOPROL-XL) 25 MG 24 hr tablet TAKE ONE-HALF TABLET BY MOUTH EVERY DAY 01/23/16  Yes Riccardo Dubin, MD  senna-docusate (SENOKOT-S) 8.6-50 MG tablet Take 2 tablets by mouth at bedtime as needed for mild constipation. 08/24/15  Yes Alex Ronnie Derby, DO  Sennosides (EX-LAX PO) Take 5 mLs by mouth daily as needed (for laxative).   Yes Historical Provider, MD  simethicone (GAS-X) 80 MG chewable tablet Chew 1 tablet (80 mg total) by mouth every 6 (six) hours as needed for flatulence. 01/08/15  Yes Corky Sox, MD  warfarin (COUMADIN) 5 MG tablet Take 5-7.5 mg by mouth See admin instructions. 5 mg on Monday Wednesday Friday. 7.5 mg all other days.   Yes Historical Provider, MD  warfarin (COUMADIN) 5 MG tablet Take one pill daily except  take one half pill (2.5mg ) on Mondays and Thursdays Patient taking differently: Take 5-7.5 mg by mouth every morning. Takes 7.5mg  on mon, thurs, fri Takes 5 mg all other days 10/24/15   Bartholomew Crews, MD     Social History   Social History  . Marital Status: Widowed    Spouse Name: N/A  . Number of Children: N/A  . Years of Education: N/A   Occupational History  . Not on file.   Social History Main Topics  . Smoking status: Never Smoker   . Smokeless tobacco: Never Used  . Alcohol Use: No  . Drug Use: No  . Sexual Activity: No   Other Topics Concern  . Not on file   Social History Narrative    Patient is a widow. She has 11 children 5 of who are living. She is retired  in 1993 from [employer] services. She denies tobacco alcohol or drug use.    Family Status  Relation Status Death Age  . Mother Deceased   . Father Deceased    Family History  Problem Relation Age of Onset  . Heart disease Mother   . Diabetes Mother   . Hyperlipidemia Mother   . Hypertension Mother   . Heart disease Father   . Diabetes Father   . Hyperlipidemia Father   . Hypertension Father      ROS:  Full 14 point review of systems complete and found to be negative unless listed above.  Physical Exam: Blood pressure 167/70, pulse 41, temperature 98.1 F (36.7 C), temperature source Oral, resp. rate 18, height 5\' 4"  (1.626 m), SpO2 98 %.  General: Well developed, well nourished, female in no acute distress Head: Eyes PERRLA, No xanthomas. Normocephalic and atraumatic, oropharynx without edema or exudate.  Lungs: CTA Heart: HRRR S1 S2, no rub/gallop, No murmur. Pulses are 2+ extrem.   Neck: No carotid bruits. No lymphadenopathy.  No JVD. Abdomen: Bowel sounds present, abdomen soft and non-tender without masses or hernias noted. Msk:  No spine or cva tenderness. No weakness, no joint deformities or effusions. Extremities: No clubbing or cyanosis.  No edema.  Neuro: Alert and oriented X 3. No  focal deficits noted. Psych:  Good affect, responds appropriately Skin: No rashes or lesions noted.  Labs:   Lab Results  Component Value Date   WBC 4.5 02/05/2016   HGB 10.0* 02/05/2016   HCT 30.9* 02/05/2016   MCV 94.5 02/05/2016   PLT 192 02/05/2016    Recent Labs  02/02/16 1442  INR 2.10    Recent Labs Lab 02/05/16 1052  NA 135  K 5.2*  CL 106  CO2 22  BUN 28*  CREATININE 2.39*  CALCIUM 8.5*  PROT 5.7*  BILITOT 0.9  ALKPHOS 77  ALT 14  AST 27  GLUCOSE 188*  ALBUMIN 3.1*    Recent Labs  02/05/16 1123  TROPIPOC 0.00    Echo: last Echo was in 2008 SUMMARY - Overall left ventricular systolic function was normal. Left    ventricular ejection fraction was estimated , range being 55    % to 60 %. There was no diagnostic evidence of left    ventricular regional wall motion abnormalities. Doppler    parameters were consistent with abnormal left ventricular    relaxation. - Findings were suggestive of a possible bicuspid aortic valve.    Aortic valve thickness was mildly increased.  ECG:  Junctional rate 39, RBBB, LPFB  Radiology:  Dg Chest Portable 1 View  02/05/2016  CLINICAL DATA:  82 year old female with bradycardia. EXAM: PORTABLE CHEST 1 VIEW COMPARISON:  11/17/2015 and prior chest radiographs FINDINGS: Cardiomegaly and mild peribronchial thickening again noted. There is no evidence of focal airspace disease, pulmonary edema, suspicious pulmonary nodule/mass, pleural effusion, or pneumothorax. No acute bony abnormalities are identified. Surgical clips overlying the right neck again noted. IMPRESSION: Cardiomegaly without evidence of acute cardiopulmonary disease. Electronically Signed   By: Margarette Canada M.D.   On: 02/05/2016 12:11    ASSESSMENT AND PLAN:    Active Problems:   Bradycardia  1. Bradycardia: Patient presents with feeling lightheaded and "woozy". EKG shows junctional rhythm with a rate of 39. She is asymptomatic  at this time of my encounter. She is hypertensive with systolic blood pressures in the 190-200 range. She may need to be considered for permanent pacemaker insertion however we  will need to wait until her diltiazem and metoprolol are cleared from her system. We can consider temporary wire as well. Diltiazem and metoprolol discontinued.   Will check TSH, order Echo.    2. HTN: Hypertensive with SBP's in 190-200's, would use hydralazine IV prn for better control. She can take po meds, would use amlodipine as maintenance.   3. CKD stage III: GFR 20, creatinine is 2.39.  No ACE or ARB. Avoid nephrotoxic medications.      Signed: Arbutus Leas, NP 02/05/2016 2:21 PM Pager [phone]  Co-Sign MD

## 2016-02-05 NOTE — Progress Notes (Signed)
ANTICOAGULATION CONSULT NOTE - Initial Consult  Pharmacy Consult for Coumadin Indication: DVT history  No Known Allergies  Patient Measurements: Height: 5\' 4"  (162.6 cm) IBW/kg (Calculated) : 54.7  Vital Signs: Temp: 98.3 F (36.8 C) (07/13 1511) Temp Source: Oral (07/13 1511) BP: 167/70 mmHg (07/13 1403) Pulse Rate: 41 (07/13 1403)  Labs:  Recent Labs  02/04/16 1612 02/05/16 1052 02/05/16 1419  HGB  --  10.0*  --   HCT  --  30.9*  --   PLT  --  192  --   LABPROT  --   --  30.4*  INR  --   --  2.97*  CREATININE 2.05* 2.39*  --   TROPONINI  --   --  <0.03    Estimated Creatinine Clearance: 14.6 mL/min (by C-G formula based on Cr of 2.39).   Medical History: Past Medical History  Diagnosis Date  . Diabetes mellitus   . Hyperlipidemia   . Hypertension   . CVA (cerebrovascular accident) [facility])  October 2007     CT of the head atrophy with multiple remote insults noted but no definite acute findings  . Blindness of left eye      likely related to stroke, left cataract removed from that eye with complications  . Anemia      normocytic anemia with baseline hemoglobin 10-11  . Chronic kidney disease 2006     left renal artery stenosis with probable hemodynamic significance, kidneys are normal in morphology without focal lesions or hydronephrosis this is based but cannot A. of the abdomen with and without contrast done on the 31st 2006  . Glaucoma   . Personal history of DVT (deep vein thrombosis) 08/11/2010  . Calculus gallbladder and bile duct with cholecystitis with obstruction 01/2015    Assessment: 82 year old female on Coumadin PTA for DVT history INR on admission = 2.97, last dose 7/12 Home dose of 5 mg MWF, 7.5 mg other days  Goal of Therapy:  INR 2-3 Monitor platelets by anticoagulation protocol: Yes   Plan:  Coumadin 5 mg po x 1 today (off schedule) Daily INR  Thank you Anette Guarneri, PharmD [phone] 02/05/2016,3:45 PM

## 2016-02-06 ENCOUNTER — Inpatient Hospital Stay (HOSPITAL_COMMUNITY): Payer: Medicare Other

## 2016-02-06 ENCOUNTER — Other Ambulatory Visit: Payer: Self-pay

## 2016-02-06 ENCOUNTER — Encounter: Payer: Self-pay | Admitting: Internal Medicine

## 2016-02-06 DIAGNOSIS — I495 Sick sinus syndrome: Secondary | ICD-10-CM | POA: Diagnosis not present

## 2016-02-06 DIAGNOSIS — I129 Hypertensive chronic kidney disease with stage 1 through stage 4 chronic kidney disease, or unspecified chronic kidney disease: Secondary | ICD-10-CM | POA: Diagnosis not present

## 2016-02-06 DIAGNOSIS — R001 Bradycardia, unspecified: Secondary | ICD-10-CM | POA: Diagnosis not present

## 2016-02-06 DIAGNOSIS — I160 Hypertensive urgency: Secondary | ICD-10-CM | POA: Diagnosis not present

## 2016-02-06 DIAGNOSIS — E785 Hyperlipidemia, unspecified: Secondary | ICD-10-CM | POA: Diagnosis not present

## 2016-02-06 DIAGNOSIS — E1122 Type 2 diabetes mellitus with diabetic chronic kidney disease: Secondary | ICD-10-CM | POA: Diagnosis not present

## 2016-02-06 DIAGNOSIS — I492 Junctional premature depolarization: Secondary | ICD-10-CM | POA: Diagnosis not present

## 2016-02-06 DIAGNOSIS — N189 Chronic kidney disease, unspecified: Secondary | ICD-10-CM | POA: Diagnosis not present

## 2016-02-06 LAB — BASIC METABOLIC PANEL
Anion gap: 5 (ref 5–15)
BUN: 35 mg/dL — ABNORMAL HIGH (ref 6–20)
CHLORIDE: 106 mmol/L (ref 101–111)
CO2: 25 mmol/L (ref 22–32)
Calcium: 8.8 mg/dL — ABNORMAL LOW (ref 8.9–10.3)
Creatinine, Ser: 2.45 mg/dL — ABNORMAL HIGH (ref 0.44–1.00)
GFR calc non Af Amer: 17 mL/min — ABNORMAL LOW (ref >60)
GFR, EST AFRICAN AMERICAN: 19 mL/min — AB (ref >60)
Glucose, Bld: 71 mg/dL (ref 65–99)
POTASSIUM: 4.2 mmol/L (ref 3.5–5.1)
SODIUM: 136 mmol/L (ref 135–145)

## 2016-02-06 LAB — GLUCOSE, CAPILLARY
GLUCOSE-CAPILLARY: 91 mg/dL (ref 65–99)
Glucose-Capillary: 103 mg/dL — ABNORMAL HIGH (ref 65–99)
Glucose-Capillary: 277 mg/dL — ABNORMAL HIGH (ref 65–99)

## 2016-02-06 LAB — ECHOCARDIOGRAM COMPLETE
Height: 64 in
WEIGHTICAEL: 2321.6 [oz_av]

## 2016-02-06 LAB — PROTIME-INR
INR: 2.94 — ABNORMAL HIGH (ref 0.00–1.49)
Prothrombin Time: 30.1 seconds — ABNORMAL HIGH (ref 11.6–15.2)

## 2016-02-06 LAB — TROPONIN I: Troponin I: <0.03 ng/mL (ref <0.03)

## 2016-02-06 LAB — MAGNESIUM: MAGNESIUM: 2.6 mg/dL — AB (ref 1.7–2.4)

## 2016-02-06 MED ORDER — WARFARIN SODIUM 5 MG PO TABS
5.0000 mg | ORAL_TABLET | Freq: Once | ORAL | Status: AC
  Administered 2016-02-06: 5 mg via ORAL
  Filled 2016-02-06: qty 1

## 2016-02-06 NOTE — Progress Notes (Signed)
Hospital Problem List     Principal Problem:   Bradycardia Active Problems:   CKD (chronic kidney disease) stage 3, GFR 30-59 ml/min   Hypertensive urgency    Patient Profile:   Primary Cardiologist: New - Dr. Oval Linsey  82 yo female w/ PMH of Type 2 DM, HTN, HLD, CVA and CKS who presented to [facility] ED on 7/13 for lightheadedness. Found to be bradycardiac in the 40's.   Subjective   Denies any chest discomfort, palpitations, lightheadedness, or pre-syncope. HR in mid-30's - 50's overnight, up into the 60's during this encounter when helping the patient reposition in bed.  Inpatient Medications    . allopurinol  150 mg Oral Daily  . amLODipine  5 mg Oral Daily  . calcium-vitamin D  1 tablet Oral BID WC  . dorzolamide-timolol  1 drop Both Eyes BID  . insulin aspart  0-5 Units Subcutaneous QHS  . insulin aspart  0-9 Units Subcutaneous TID WC  . latanoprost  1 drop Both Eyes QHS  . sodium chloride flush  3 mL Intravenous Q12H  . Warfarin - Pharmacist Dosing Inpatient   Does not apply q1800    Vital Signs    Filed Vitals:   02/05/16 1511 02/05/16 2022 02/06/16 0020 02/06/16 0445  BP:  151/65 98/46 173/54  Pulse:  68 37 53  Temp: 98.3 F (36.8 C) 98.2 F (36.8 C) 98 F (36.7 C) 98.2 F (36.8 C)  TempSrc: Oral Oral Oral Oral  Resp:  18 16 16   Height: 5\' 4"  (1.626 m)     Weight: 143 lb 4.8 oz (65 kg)   145 lb 1.6 oz (65.817 kg)  SpO2: 100% 97% 100% 96%    Intake/Output Summary (Last 24 hours) at 02/06/16 0919 Last data filed at 02/05/16 2200  Gross per 24 hour  Intake    140 ml  Output    650 ml  Net   -510 ml   Filed Weights   02/05/16 1511 02/06/16 0445  Weight: 143 lb 4.8 oz (65 kg) 145 lb 1.6 oz (65.817 kg)    Physical Exam    General: Pleasant, elderly  female appearing in no acute distress. Head: Normocephalic, atraumatic.  Neck: Supple without bruits, JVD not elevated. Lungs:  Resp regular and unlabored, CTA without wheezing or rales. Heart:  RRR, S1, S2, no S3, S4, or murmur; no rub. Abdomen: Soft, non-tender, non-distended with normoactive bowel sounds. No hepatomegaly. No rebound/guarding. No obvious abdominal masses. Extremities: No clubbing, cyanosis, or edema. Distal pedal pulses are 2+ bilaterally. Neuro: Alert and oriented X 3. Moves all extremities spontaneously. Psych: Normal affect.  Labs    CBC  Recent Labs  02/05/16 1052  WBC 4.5  NEUTROABS 2.0  HGB 10.0*  HCT 30.9*  MCV 94.5  PLT [id number]   Basic Metabolic Panel  Recent Labs  02/05/16 1052 02/06/16 0207  NA 135 136  K 5.2* 4.2  CL 106 106  CO2 22 25  GLUCOSE 188* 71  BUN 28* 35*  CREATININE 2.39* 2.45*  CALCIUM 8.5* 8.8*  MG  --  2.6*   Liver Function Tests  Recent Labs  02/05/16 1052  AST 27  ALT 14  ALKPHOS 77  BILITOT 0.9  PROT 5.7*  ALBUMIN 3.1*   No results for input(s): LIPASE, AMYLASE in the last 72 hours. Cardiac Enzymes  Recent Labs  02/05/16 1419 02/05/16 1944 02/06/16 0207  TROPONINI <0.03 <0.03 <0.03   Thyroid Function Tests  Recent Labs  02/05/16 1419  TSH 2.703    Telemetry    Sinus bradycardia, HR in mid-30's - 50's. Frequent pauses noted up to 2.3 seconds.  ECG    Sinus bradycardia, HR 52, LAD.    Cardiac Studies and Radiology    Ct Head Wo Contrast  02/01/2016  CLINICAL DATA:  CVA EXAM: CT HEAD WITHOUT CONTRAST TECHNIQUE: Contiguous axial images were obtained from the base of the skull through the vertex without intravenous contrast. COMPARISON:  05/04/2006 FINDINGS: There is encephalomalacia in the right frontal and parietal lobes. Lacune or infarcts in the left basal ganglia are stable. There is no mass effect, midline shift, or acute intracranial hemorrhage. Trace fluid in the bilateral mastoid air cells. No skull fracture. IMPRESSION: No acute intracranial pathology. Electronically Signed   By: Marybelle Killings M.D.   On: 02/01/2016 14:23   Dg Chest Portable 1 View  02/05/2016  CLINICAL DATA:   82 year old female with bradycardia. EXAM: PORTABLE CHEST 1 VIEW COMPARISON:  11/17/2015 and prior chest radiographs FINDINGS: Cardiomegaly and mild peribronchial thickening again noted. There is no evidence of focal airspace disease, pulmonary edema, suspicious pulmonary nodule/mass, pleural effusion, or pneumothorax. No acute bony abnormalities are identified. Surgical clips overlying the right neck again noted. IMPRESSION: Cardiomegaly without evidence of acute cardiopulmonary disease. Electronically Signed   By: Margarette Canada M.D.   On: 02/05/2016 12:11    Echocardiogram: Pending  Assessment & Plan    1. Symptomatic Bradycardia - Patient presented with symptoms of lightheadedness and dizziness. EKG showed junctional rhythm with HR of 39. Had been told to stop her BB the previous week due to bradycardia but unclear is she stopped this. She has continued to take Diltiazem as well.   - echocardiogram is pending. TSH normal at 2.703. Cyclic troponin values negative.  - On telemetry, HR varying from the 30's - 50's with pauses ranging up to 2.3 seconds. However, with helping her reposition in the bed, HR improved into the 60's with activity. - She may need to be considered for permanent pacemaker insertion, however we will need to wait until the  diltiazem and metoprolol are cleared from her system. Started on Glucagon bolus and infusion which has since been discontinued.  2. HTN - BP has been variable at 98/46 - 173/70 in the past 24 hours. - currently on Amlodipine and IV Hydralazine. Could consider switching to PO Hydralaizine 25mg  TID, as we cannot use an ACE-I/ARB secondary to her CKD and no BB or Cardizem secondary to bradycardia.   - on Clonidine as well PTA. Would be concerned about using this long-term in an 82 yo patient.   3. CKD stage III - creatinine 2.45 this AM.No ACE-I or ARB. - Avoid nephrotoxic medications.   Signed, Erma Heritage , PA-C 9:19 AM 02/06/2016 Pager:  [phone]

## 2016-02-06 NOTE — Progress Notes (Signed)
Subjective: Chelsea Mcdaniel is an 83yo F with a hx of DM, HTN, CVA and CKD who presents to the ED for symptomatic bradycardia that her health aid at home noted on a routine exam.  Patient states that she feels good this morning, and is ready to go home.  She has not had any more episodes of lightheadedness.  She denies chest pain, N/V, SOB, diaphoresis.    Overnight: Heart rate remained low between 37-53.  Troponins neg X3.  EKG showed sinus bradycardia with first degree AV block.    Objective: Vital signs in last 24 hours: Filed Vitals:   02/05/16 2022 02/06/16 0020 02/06/16 0445 02/06/16 1358  BP: 151/65 98/46 173/54 142/62  Pulse: 68 37 53 69  Temp: 98.2 F (36.8 C) 98 F (36.7 C) 98.2 F (36.8 C) 98 F (36.7 C)  TempSrc: Oral Oral Oral Oral  Resp: 18 16 16 18   Height:      Weight:   65.817 kg (145 lb 1.6 oz)   SpO2: 97% 100% 96% 98%   Weight change:   Intake/Output Summary (Last 24 hours) at 02/06/16 1424 Last data filed at 02/06/16 0900  Gross per 24 hour  Intake    140 ml  Output    650 ml  Net   -510 ml    Gen: elderly laying in bed in NAD Head: AT/[place] CV: bradycardic, irregular rhythm, no murmurs noted Lungs: CTAB Abd: soft, bowel sounds present, no masses, some tenderness in the upper right quadrant on deep palpation Ext: no cyanosis or edema noted Skin: no rashes, bruises or lesions noted Neuro: CN II-XII intact, strength intact and symmetric in upper and lower ext.  Lab Results: CBC Latest Ref Rng 02/05/2016 02/01/2016 10/06/2015  WBC 4.0 - 10.5 K/uL 4.5 4.9 5.2  Hemoglobin 12.0 - 15.0 g/dL 10.0(L) 9.8(L) -  Hematocrit 36.0 - 46.0 % 30.9(L) 31.0(L) 33.9(L)  Platelets 150 - 400 K/uL 192 235 323   CMP Latest Ref Rng 02/06/2016 02/05/2016 02/04/2016  Glucose 65 - 99 mg/dL 71 188(H) 135(H)  BUN 6 - 20 mg/dL 35(H) 28(H) 23  Creatinine 0.44 - 1.00 mg/dL 2.45(H) 2.39(H) 2.05(H)  Sodium 135 - 145 mmol/L 136 135 141  Potassium 3.5 - 5.1 mmol/L 4.2 5.2(H) 4.9  Chloride  101 - 111 mmol/L 106 106 102  CO2 22 - 32 mmol/L 25 22 25   Calcium 8.9 - 10.3 mg/dL 8.8(L) 8.5(L) 9.1  Total Protein 6.5 - 8.1 g/dL - 5.7(L) -  Total Bilirubin 0.3 - 1.2 mg/dL - 0.9 -  Alkaline Phos 38 - 126 U/L - 77 -  AST 15 - 41 U/L - 27 -  ALT 14 - 54 U/L - 14 -    TSH: 2.7  INR: 2.94  Troponin (Point of Care Test)  Recent Labs  02/05/16 1123  TROPIPOC 0.00      Micro Results: No results found for this or any previous visit (from the past 240 hour(s)). Studies/Results: Dg Chest Portable 1 View  02/05/2016  CLINICAL DATA:  82 year old female with bradycardia. EXAM: PORTABLE CHEST 1 VIEW COMPARISON:  11/17/2015 and prior chest radiographs FINDINGS: Cardiomegaly and mild peribronchial thickening again noted. There is no evidence of focal airspace disease, pulmonary edema, suspicious pulmonary nodule/mass, pleural effusion, or pneumothorax. No acute bony abnormalities are identified. Surgical clips overlying the right neck again noted. IMPRESSION: Cardiomegaly without evidence of acute cardiopulmonary disease. Electronically Signed   By: Margarette Canada M.D.   On: 02/05/2016 12:11   Medications:  I have reviewed the patient's current medications. Scheduled Meds: . allopurinol  150 mg Oral Daily  . amLODipine  5 mg Oral Daily  . calcium-vitamin D  1 tablet Oral BID WC  . dorzolamide-timolol  1 drop Both Eyes BID  . insulin aspart  0-5 Units Subcutaneous QHS  . insulin aspart  0-9 Units Subcutaneous TID WC  . latanoprost  1 drop Both Eyes QHS  . sodium chloride flush  3 mL Intravenous Q12H  . warfarin  5 mg Oral ONCE-1800  . Warfarin - Pharmacist Dosing Inpatient   Does not apply q1800   Continuous Infusions:  PRN Meds:.hydrALAZINE, senna-docusate Assessment/Plan: Bradycardia:  Her EKG on admission shows a RBBB and left left posterior fascicular block with ST depressions in the inferior leads. Her most recent EKG showed sinus bradycardia with first degree AV block. Troponins  have been negative X3, and she is still not complaining on any chest pain, SOB, nausea/vomiting or diaphoresis. Cardiology agreed that we should d/c her metoprolol and diltiazam for now. They put her on a glucagon bolus and infusion to counteract the beta blockade, which has since been discontinued. We are awaiting results from her Echo. Cardiology may recommend permanent pacemaker, however we need to wait for the metoprolol and diltiazam to be cleared and see what the echo shows.   Her TSH was wnl at 2.703.   -waiting echo results -appreciate cardiology recs regarding pacemaker -telemetry  Hypertension: Patients log revealed that her baseline systolics are [id number] even on home diltiazam and metoprolol. We will try to reduce blood pressure slowly in the outpatient setting to reduce risk of stroke and ACS. -d/c metoprolol and diltiazam  -amlodipine 5mg  -hydaralazine 10mg  injection  Diabetes Mellitus type 2:  -Novalog 0-5 U at bedtime and 0-9 U with meals  Constipation: -senokot 2 8.6-50 mg tablets at night  History of DVT: -warfarin per pharmacy -protime INR in am  Gout: -continue home allopurinol.6mg   This is a Careers information officer Note.  The care of the patient was discussed with Dr. Eppie Gibson and the assessment and plan formulated with their assistance.  Please see their attached note for official documentation of the daily encounter.   LOS: 1 day   Elesa Hacker, Med Student 02/06/2016, 2:24 PM

## 2016-02-06 NOTE — Progress Notes (Signed)
  Echocardiogram 2D Echocardiogram has been performed.  Diamond Nickel 02/06/2016, 12:38 PM

## 2016-02-06 NOTE — H&P (Signed)
Internal Medicine Attending Admission Note Date: 02/06/2016  Patient name: Chelsea Mcdaniel Medical record number: [id number] Date of birth: 1927-09-09 Age: 82 y.o. Gender: female  I saw and evaluated the patient. I reviewed the resident's note and I agree with the resident's findings and plan as documented in the resident's note.  Chief Complaint(s): Feeling woozy upon standing 4 days.  History - key components related to admission:  Chelsea Mcdaniel is an 82 year old woman with a history of diabetes, stage III chronic kidney disease, prior cerebrovascular accidents, deep venous thromboses requiring anticoagulation, hypertension, and hyperlipidemia who presents with a four-day history of feeling woozy upon standing. Her home health aide noted her heart rate was in the 30s and 40s and therefore brought her to the emergency department for further evaluation. Apparently she was seen in the internal medicine clinic 2 days prior to admission and noted to have heart rate in the 40s. She was noted to be on metoprolol, diltiazem, and clonidine and it was recommended that she stop the metoprolol. It is unclear if she did. In the emergency department she was found to be in a junctional escape rhythm at 39 bpm but was able to maintain her blood pressure. She was started on a glucagon drip and admitted to the internal medicine teaching service for further evaluation and care.  The diltiazem and metoprolol were discontinued and the glucagon was eventually weaned off. She has had heart rates in the 60s today and is feeling improved symptomatically.  Physical Exam - key components related to admission:  Filed Vitals:   02/05/16 2022 02/06/16 0020 02/06/16 0445 02/06/16 1358  BP: 151/65 98/46 173/54 142/62  Pulse: 68 37 53 69  Temp: 98.2 F (36.8 C) 98 F (36.7 C) 98.2 F (36.8 C) 98 F (36.7 C)  TempSrc: Oral Oral Oral Oral  Resp: 18 16 16 18   Height:      Weight:   145 lb 1.6 oz (65.817 kg)   SpO2: 97% 100%  96% 98%   Gen.: Well-developed, well-nourished, elderly woman sitting comfortably in bed in no acute distress. Heart: Distant heart sounds without obvious murmurs, rubs, or gallops. Lungs: Clear to auscultation bilaterally.  Lab results:  Basic Metabolic Panel:  Recent Labs  02/05/16 1052 02/06/16 0207  NA 135 136  K 5.2* 4.2  CL 106 106  CO2 22 25  GLUCOSE 188* 71  BUN 28* 35*  CREATININE 2.39* 2.45*  CALCIUM 8.5* 8.8*  MG  --  2.6*   Liver Function Tests:  Recent Labs  02/05/16 1052  AST 27  ALT 14  ALKPHOS 77  BILITOT 0.9  PROT 5.7*  ALBUMIN 3.1*   CBC:  Recent Labs  02/05/16 1052  WBC 4.5  NEUTROABS 2.0  HGB 10.0*  HCT 30.9*  MCV 94.5  PLT 192   Cardiac Enzymes:  Recent Labs  02/05/16 1419 02/05/16 1944 02/06/16 0207  TROPONINI <0.03 <0.03 <0.03   CBG:  Recent Labs  02/04/16 1526 02/05/16 1657 02/05/16 2103 02/06/16 0714 02/06/16 1116  GLUCAP 134* 240* 222* 91 103*   Thyroid Function Tests:  Recent Labs  02/05/16 1419  TSH 2.703   Coagulation:  Recent Labs  02/05/16 1419 02/06/16 0207  INR 2.97* 2.94*   Imaging results:  Dg Chest Portable 1 View  02/05/2016  CLINICAL DATA:  82 year old female with bradycardia. EXAM: PORTABLE CHEST 1 VIEW COMPARISON:  11/17/2015 and prior chest radiographs FINDINGS: Cardiomegaly and mild peribronchial thickening again noted. There is no evidence of focal  airspace disease, pulmonary edema, suspicious pulmonary nodule/mass, pleural effusion, or pneumothorax. No acute bony abnormalities are identified. Surgical clips overlying the right neck again noted. IMPRESSION: Cardiomegaly without evidence of acute cardiopulmonary disease. Electronically Signed   By: Margarette Canada M.D.   On: 02/05/2016 12:11   Other results:  EKG (02/05/2016): Junctional escape at 39 bpm, right axis deviation  EKG (02/06/2016): Normal sinus bradycardia at 52 bpm, left axis deviation, first-degree AV block, left anterior  hemiblock, no significant Q waves, no LVH by voltage, good R wave progression, no ST or T-wave changes. Unchanged from the previous ECG from 02/01/2016 and markedly improved from the ECG on 02/05/2016 which demonstrated a junctional escape rhythm.  Assessment & Plan by Problem:  Chelsea Mcdaniel is an 82 year old woman with a history of diabetes, stage III chronic kidney disease, prior cerebrovascular accidents, deep venous thromboses requiring anticoagulation, hypertension, and hyperlipidemia who presents with a four-day history of feeling woozy upon standing. She was found to be in a junctional escape rhythm at 39 bpm and it was noted that she was on diltiazem, metoprolol, and clonidine. This combination potentially can block the AV node, especially in an elderly woman. The diltiazem and metoprolol were discontinued and a glucose gone drip was started. Her pulse has now rebounded to the 60s off of these 2 nodal agents even with a glucagon drip no longer active. Beta blocker should be used very carefully in the setting of clonidine. If she were to have trouble in the future weaning off the clonidine would be ideal.  1) Junctional escape with symptoms: Resolved with discontinuance of the 2 nodal agents, specifically metoprolol and diltiazem. We will increase amlodipine to 10 mg daily and start oral hydralazine to be titrated up in the outpatient setting. It will be important that the clonidine be slowly weaned off in this elderly woman with some conduction system abnormalities. This can be done as the hydralazine is titrated upwards in the outpatient setting. We will place both diltiazem and metoprolol on her allergy list to make sure these do not get restarted in the future.  2) Disposition: I anticipate she will be ready for discharge home tomorrow if she continues to do well overnight off of these nodal agents with heart rates back in the 50's and 60's.

## 2016-02-06 NOTE — Progress Notes (Signed)
ANTICOAGULATION CONSULT NOTE  Pharmacy Consult for warfarin Indication: DVT history  No Known Allergies  Patient Measurements: Height: 5\' 4"  (162.6 cm) Weight: 145 lb 1.6 oz (65.817 kg) IBW/kg (Calculated) : 54.7  Vital Signs: Temp: 98.2 F (36.8 C) (07/14 0445) Temp Source: Oral (07/14 0445) BP: 173/54 mmHg (07/14 0445) Pulse Rate: 53 (07/14 0445)  Labs:  Recent Labs  02/04/16 1612 02/05/16 1052 02/05/16 1419 02/05/16 1944 02/06/16 0207  HGB  --  10.0*  --   --   --   HCT  --  30.9*  --   --   --   PLT  --  192  --   --   --   LABPROT  --   --  30.4*  --  30.1*  INR  --   --  2.97*  --  2.94*  CREATININE 2.05* 2.39*  --   --  2.45*  TROPONINI  --   --  <0.03 <0.03 <0.03    Estimated Creatinine Clearance: 15.4 mL/min (by C-G formula based on Cr of 2.45).   Medical History: Past Medical History  Diagnosis Date  . Hyperlipidemia   . Hypertension   . Blindness of left eye      likely related to stroke, left cataract removed from that eye with complications  . Anemia      normocytic anemia with baseline hemoglobin 10-11  . Glaucoma   . Personal history of DVT (deep vein thrombosis) 08/11/2010    BLE  . Calculus gallbladder and bile duct with cholecystitis with obstruction 01/2015  . Type II diabetes mellitus ([place])   . CVA (cerebrovascular accident) [facility])  October 2007     CT of the head atrophy with multiple remote insults noted but no definite acute findings  . CVA (cerebral vascular accident) ([facility]) 1990's  . Gout   . Chronic kidney disease 2006     left renal artery stenosis with probable hemodynamic significance, kidneys are normal in morphology without focal lesions or hydronephrosis this is based but cannot A. of the abdomen with and without contrast done on the 31st 2006  . CKD (chronic kidney disease), stage III     Assessment: 82 year old female on warfarin PTA for DVT history INR 2.97>2.94, last dose PTA 7/12 Home dose of 5 mg MWF, 7.5 mg other  days  Goal of Therapy:  INR 2-3 Monitor platelets by anticoagulation protocol: Yes   Plan:  Coumadin 5 mg po x 1 today Monitor daily INR, CBC, clinical course, s/sx of bleed, PO intake, DDI   Thank you for allowing [place] to participate in this patients care. Jens Som, PharmD Pager: [phone]  02/06/2016,10:50 AM

## 2016-02-06 NOTE — Care Management Obs Status (Signed)
[facility] NOTIFICATION   Patient Details  Name: Chelsea Mcdaniel MRN: [id number] Date of Birth: 1928-02-08   Medicare Observation Status Notification Given:  Yes    Bethena Roys, RN 02/06/2016, 4:42 PM

## 2016-02-07 DIAGNOSIS — R001 Bradycardia, unspecified: Secondary | ICD-10-CM | POA: Diagnosis not present

## 2016-02-07 DIAGNOSIS — I492 Junctional premature depolarization: Secondary | ICD-10-CM | POA: Diagnosis not present

## 2016-02-07 LAB — PROTIME-INR
INR: 2.94 — AB (ref 0.00–1.49)
Prothrombin Time: 30.2 seconds — ABNORMAL HIGH (ref 11.6–15.2)

## 2016-02-07 LAB — GLUCOSE, CAPILLARY
GLUCOSE-CAPILLARY: 131 mg/dL — AB (ref 65–99)
GLUCOSE-CAPILLARY: 156 mg/dL — AB (ref 65–99)

## 2016-02-07 MED ORDER — HYDRALAZINE HCL 10 MG PO TABS: 10.0000 mg | ORAL_TABLET | Freq: Three times a day (TID) | ORAL | Status: DC

## 2016-02-07 MED ORDER — CLONIDINE HCL 0.2 MG/24HR TD PTWK: 0.2000 mg | MEDICATED_PATCH | TRANSDERMAL | Status: DC

## 2016-02-07 MED ORDER — AMLODIPINE BESYLATE 5 MG PO TABS: 5.0000 mg | ORAL_TABLET | Freq: Every day | ORAL | Status: DC

## 2016-02-07 MED ORDER — WARFARIN SODIUM 5 MG PO TABS: 5.0000 mg | ORAL_TABLET | Freq: Once | ORAL | Status: DC

## 2016-02-07 NOTE — Progress Notes (Signed)
[facility] for warfarin Indication: DVT history  Allergies  Allergen Reactions  . Diltiazem Other (See Comments)    Symptomatic bradaycardia  . Metoprolol Other (See Comments)    Symptomatic bradycardia    Patient Measurements: Height: 5\' 4"  (162.6 cm) Weight: 139 lb 14.4 oz (63.458 kg) IBW/kg (Calculated) : 54.7  Vital Signs: Temp: 98.6 F (37 C) (07/15 0526) Temp Source: Oral (07/15 0526) BP: 158/79 mmHg (07/15 0951)  Labs:  Recent Labs  02/04/16 1612 02/05/16 1052 02/05/16 1419 02/05/16 1944 02/06/16 0207 02/07/16 0625  HGB  --  10.0*  --   --   --   --   HCT  --  30.9*  --   --   --   --   PLT  --  192  --   --   --   --   LABPROT  --   --  30.4*  --  30.1* 30.2*  INR  --   --  2.97*  --  2.94* 2.94*  CREATININE 2.05* 2.39*  --   --  2.45*  --   TROPONINI  --   --  <0.03 <0.03 <0.03  --     Estimated Creatinine Clearance: 14.2 mL/min (by C-G formula based on Cr of 2.45).   Medical History: Past Medical History  Diagnosis Date  . Hyperlipidemia   . Hypertension   . Blindness of left eye      likely related to stroke, left cataract removed from that eye with complications  . Anemia      normocytic anemia with baseline hemoglobin 10-11  . Glaucoma   . Personal history of DVT (deep vein thrombosis) 08/11/2010    BLE  . Calculus gallbladder and bile duct with cholecystitis with obstruction 01/2015  . Type II diabetes mellitus ([place])   . CVA (cerebrovascular accident) [facility])  October 2007     CT of the head atrophy with multiple remote insults noted but no definite acute findings  . CVA (cerebral vascular accident) ([place]) 1990's  . Gout   . Chronic kidney disease 2006     left renal artery stenosis with probable hemodynamic significance, kidneys are normal in morphology without focal lesions or hydronephrosis this is based but cannot A. of the abdomen with and without contrast done on the 31st 2006  . CKD (chronic kidney  disease), stage III     Assessment: 82 year old female on warfarin PTA for DVT history INR remains therapeutic with slightly lower dose on Thursday. INR 2.94, last dose PTA 7/12 Home dose of 5 mg MWF, 7.5 mg other days  Goal of Therapy:  INR 2-3 Monitor platelets by anticoagulation protocol: Yes   Plan:  Will give lower dose of warfarin 5 mg again today and plan to restart home dosing tomorrow of 5 mg on MWF, and 7.5 mg all other days Monitor daily INR, CBC, clinical course, s/sx of bleed, PO intake, DDI   Thank you for allowing [place] to participate in this patients care. Jens Som, PharmD Pager: [phone]  02/07/2016,10:12 AM

## 2016-02-07 NOTE — Progress Notes (Signed)
Internal Medicine Attending  Date: 02/07/2016  Patient name: Chelsea Mcdaniel Medical record number: [id number] Date of birth: 1927-09-07 Age: 82 y.o. Gender: female  I saw and evaluated the patient. I reviewed the resident's note by Dr. Arcelia Jew and I agree with the resident's findings and plans as documented in her progress note.  When I saw Ms. Altidor on rounds this morning she was feeling well without any more wooziness with standing. Her heart rate was in the 60s to 70s. She is stable for discharge home with titration down on the clonidine as an outpatient and titration up on the other antihypertensives avoiding all nodal blockers.

## 2016-02-07 NOTE — Progress Notes (Addendum)
   Subjective: No acute events overnight. She reports feeling great today and ready to go home. Denies chest pain, SOB, palpitations. HR stable in 60-70s.  Objective: Vital signs in last 24 hours: Filed Vitals:   02/06/16 0445 02/06/16 1358 02/06/16 2000 02/07/16 0526  BP: 173/54 142/62 190/50 157/75  Pulse: 53 69 53   Temp: 98.2 F (36.8 C) 98 F (36.7 C) 98.4 F (36.9 C) 98.6 F (37 C)  TempSrc: Oral Oral Oral Oral  Resp: 16 18 16    Height:      Weight: 145 lb 1.6 oz (65.817 kg)   139 lb 14.4 oz (63.458 kg)  SpO2: 96% 98% 97% 99%   Physical Exam General: elderly woman sitting up in chair, pleasant, NAD HEENT: [place]/AT, EOMI, sclera anicteric, mucus membranes moist CV: RRR, no m/g/r Pulm: CTA bilaterally, breaths non-labored on room air Ext: no edema Neuro: alert and oriented x 3  Assessment/Plan:  Symptomatic Bradycardia: Secondary to nodal blockers, Diltiazem and Metoprolol. These have been stopped. Her HR is now stable in the 60s-70s and she is no longer symptomatic. She is stable for discharge home today. - Follow up in [facility]  HTN: BPs elevated in 150s-190s. My order for her Clonidine patch was discontinued for some reason on admission. She will need to be titrated off of this medication, but this will be done as an outpatient. Will be able to go up on hydralazine as she titrates off clonidine.  - Discharge on Clonidine 0.2 mg Qweekly patch - Continue Amlodipine 5 mg daily - Discharge on Hydralazine 10 mg TID   Type 2 DM: Blood sugars in good range. - Continue ISS  CKD Stage 4: Cr 2.45, up from baseline of ~1.5. Holding home Losartan.  - Continue to follow as outpatient   Constipation:  - Continue Senokot-S 2 tabs PRN  History of DVT: INR 1.94, in therapeutic range.  - Warfarin per pharmacy  Gout:  - Continue home Allopurinol   Diet: Heart healthy DVT Ppx: Warfarin Dispo: Discharge home today  LOS: 2 days   Juliet Rude, MD 02/07/2016, 9:42 AM Pager:  [phone]

## 2016-02-07 NOTE — Discharge Summary (Signed)
Name: Chelsea Mcdaniel MRN: [id number] DOB: 04/04/1928 82 y.o. PCP: Riccardo Dubin, MD  Date of Admission: 02/05/2016 10:39 AM Date of Discharge: 02/07/16 Attending Physician: Dr. Eppie Gibson   Discharge Diagnosis: Principal Problem Symptomatic Bradycardia Active Problems HTN Type 2 DM CKD Stage 4 Constipation Hx of DVT Gout   Discharge Medications:   Medication List    STOP taking these medications        cloNIDine 0.3 mg/24hr patch  Commonly known as:  CATAPRES - Dosed in mg/24 hr  Replaced by:  cloNIDine 0.2 mg/24hr patch     colchicine 0.6 MG tablet     diltiazem 60 MG 12 hr capsule  Commonly known as:  CARDIZEM SR     EX-LAX PO     losartan 100 MG tablet  Commonly known as:  COZAAR     metoprolol succinate 100 MG 24 hr tablet  Commonly known as:  TOPROL-XL     metoprolol succinate 25 MG 24 hr tablet  Commonly known as:  TOPROL-XL      TAKE these medications        ACCU-CHEK AVIVA device  Use as instructed     ACCU-CHEK AVIVA PLUS test strip  Generic drug:  glucose blood  Use 1 time daily to check blood sugar.     ACCU-CHEK SOFTCLIX LANCETS lancets  Use 1 time daily to check blood sugar.     acetaminophen 325 MG tablet  Commonly known as:  TYLENOL  Take 2 tablets (650 mg total) by mouth every 6 (six) hours as needed.     allopurinol 100 MG tablet  Commonly known as:  ZYLOPRIM  Take 1.5 tablets (150 mg total) by mouth daily.     amLODipine 5 MG tablet  Commonly known as:  NORVASC  Take 1 tablet (5 mg total) by mouth daily.     Calcium Carbonate-Vitamin D 600-400 MG-UNIT tablet  Take 1 tablet by mouth 2 (two) times daily.     cloNIDine 0.2 mg/24hr patch  Commonly known as:  CATAPRES - Dosed in mg/24 hr  Place 1 patch (0.2 mg total) onto the skin every Thursday.     dorzolamide-timolol 22.3-6.8 MG/ML ophthalmic solution  Commonly known as:  COSOPT  Place 1 drop into both eyes 2 (two) times daily.     hydrALAZINE 10 MG tablet  Commonly known  as:  APRESOLINE  Take 1 tablet (10 mg total) by mouth 3 (three) times daily.     JANUVIA 25 MG tablet  Generic drug:  sitaGLIPtin  TAKE 1 TABLET BY MOUTH EVERY DAY     LUMIGAN 0.01 % Soln  Generic drug:  bimatoprost  Place 1 drop into both eyes every morning.     magnesium hydroxide 400 MG/5ML suspension  Commonly known as:  MILK OF MAGNESIA  Take 30 mLs by mouth daily as needed for mild constipation or indigestion.     senna-docusate 8.6-50 MG tablet  Commonly known as:  Senokot-S  Take 2 tablets by mouth at bedtime as needed for mild constipation.     simethicone 80 MG chewable tablet  Commonly known as:  GAS-X  Chew 1 tablet (80 mg total) by mouth every 6 (six) hours as needed for flatulence.     warfarin 5 MG tablet  Commonly known as:  COUMADIN  Take 5-7.5 mg by mouth See admin instructions. 5 mg on Monday Wednesday Friday. 7.5 mg all other days.     warfarin 5 MG tablet  Commonly known  as:  COUMADIN  Take one pill daily except take one half pill (2.5mg ) on Mondays and Thursdays        Disposition and follow-up:   Chelsea Mcdaniel was [facility] from [facility] in Good condition.  At the hospital follow up visit please address:  1.  Bradycardia- Evaluate HR. Make sure patient not taking Toprol-XL or Cardizem- if has bottles with her would throw away. HTN- Needs titration off Clonidine. Discharged on Amlodipine 5 and Hydralazine 10 mg TID. Would go up on Hydralazine as decreasing Clonidine. Would caution restarting home Losartan with her recent AKI on CKD4.  CKD stage 4- Cr 2.45 on discharge. Baseline 1.5. Will need repeat bmet.   2.  Labs / imaging needed at time of follow-up: bmet  3.  Pending labs/ test needing follow-up: None   Follow-up Appointments: Follow-up Information    Follow up with [facility] On 02/18/2016.   Why:  Appointment on 02/18/16 at 2:15PM   Contact information:   [address] [place] Hospital Course by problem list:   Symptomatic Bradycardia: Patient presented with a 1 day hx of dizziness found to have a HR in the 30s-40s. EKG revealed a new RBBB and LPFB, which was new from an EKG performed four days prior. Her troponins were negative x 3. She had been taking Diltiazem 240 mg daily and Toprol-XL 100 mg daily at home. She was seen in the [facility] 1 day prior to admission and noted to be bradycardic and was advised to discontinue Toprol-XL. It was unclear if this has been done. Both nodal blockers were stopped during this admission. Cardiology was consulted and recommended to start a glucagon gtt to "wash out" the nodal agents. Her TSH level was normal. An echocardiogram was performed which showed an EF 60-65% and grade 2 diastolic dysfunction. Her HR continued to improve and was stable in the 70s by time of discharge. She will have follow up in the [facility] clinic on 7/26.   HTN: She presented with an elevated BP in the [id number] systolic. Her daughter had brought her home BP log which revealed her baseline systolic BP is typically in the 180s-200s. Daughter revealed her Clonidine patch needed to be changed so likely some component of Clonidine withdrawal played a role. Her BP remained stable in the [id number] systolic. Her home Losartan was stopped in addition to Diltiazem and Toprol-XL as above as she had an AKI on her CKD4. She was discharged on Amlodipine 5 mg daily and Hydralazine 10 mg TID. Given her age, we will titrate her off Clonidine and increase the Hydralazine as needed. Her home Losartan could be restarted as she has been on this chronically, but would caution with her CKD. This will be done as an outpatient as her BP does not need to be corrected immediately with her chronic uncontrolled hypertension.   CKD Stage 4: Cr 2.39 on admission with baseline 1.5. Her home Losartan was held. Likely in relation to her bradycardia. She will need a repeat bmet at  her outpatient follow up visit.   Discharge Vitals:   BP 158/79 mmHg  Pulse 53  Temp(Src) 98.6 F (37 C) (Oral)  Resp 16  Ht 5\' 4"  (1.626 m)  Wt 139 lb 14.4 oz (63.458 kg)  BMI 24.00 kg/m2  SpO2 99% Physical Exam General: elderly woman sitting up in chair, pleasant, NAD HEENT: [place]/AT, EOMI, sclera anicteric, mucus  membranes moist CV: RRR, no m/g/r Pulm: CTA bilaterally, breaths non-labored on room air Ext: no edema Neuro: alert and oriented x 3  Pertinent Labs, Studies, and Procedures:  Cr 2.39>2.45 Trops neg x 3 TSH 2.703  CXR port 7/13: Cardiomegaly without acute abnormalities.   Echo 02/06/16 Study Conclusions - Left ventricle: The cavity size was normal. Wall thickness was  increased in a pattern of moderate LVH. Systolic function was  normal. The estimated ejection fraction was in the range of 60%  to 65%. Wall motion was normal; there were no regional wall  motion abnormalities. Features are consistent with a pseudonormal  left ventricular filling pattern, with concomitant abnormal  relaxation and increased filling pressure (grade 2 diastolic  dysfunction). - Aortic valve: There was mild regurgitation. - Mitral valve: Mildly to moderately calcified annulus. - Left atrium: The atrium was mildly dilated.   Discharge Instructions: Discharge Instructions    Diet - low sodium heart healthy    Complete by:  As directed      Discharge instructions    Complete by:  As directed   It was a pleasure taking care of you, Ms. Sharp. You were hospitalized for bradycardia (a low heart rate).  Please remember: 1. Start taking Amlodipine 5 mg daily and Hydralazine 10 mg three times daily for your blood pressure 2. Decrease Clonidine patch to 0.2 mg every week. I sent a new prescription to your pharmacy. 3. STOP taking Losartan 4. STOP taking Metoprolol and Diltiazem 5. Follow up in the clinic on 02/18/16 at 2:15 PM  Take care, Dr. Arcelia Jew     Increase activity  slowly    Complete by:  As directed            Signed: Juliet Rude, MD 02/09/2016, 11:30 AM   Pager: [phone]

## 2016-02-07 NOTE — Progress Notes (Signed)
Subjective:  She feels fine today.  Denies angina or chest pain.  No dizziness.  Objective:  Vital Signs in the last 24 hours: BP 157/75 mmHg  Pulse 53  Temp(Src) 98.6 F (37 C) (Oral)  Resp 16  Ht 5\' 4"  (1.626 m)  Wt 63.458 kg (139 lb 14.4 oz)  BMI 24.00 kg/m2  SpO2 99%  Physical Exam: Pleasant elderly female in no acute distress Lungs:  Clear Cardiac:  Regular rhythm, normal S1 and S2, no S3 Extremities: Somewhat contracted extremities  Intake/Output from previous day: 07/14 0701 - 07/15 0700 In: 480 [P.O.:480] Out: 1300 [Urine:1300]  Weight Filed Weights   02/05/16 1511 02/06/16 0445 02/07/16 0526  Weight: 65 kg (143 lb 4.8 oz) 65.817 kg (145 lb 1.6 oz) 63.458 kg (139 lb 14.4 oz)    Lab Results: Basic Metabolic Panel:  Recent Labs  02/05/16 1052 02/06/16 0207  NA 135 136  K 5.2* 4.2  CL 106 106  CO2 22 25  GLUCOSE 188* 71  BUN 28* 35*  CREATININE 2.39* 2.45*   CBC:  Recent Labs  02/05/16 1052  WBC 4.5  NEUTROABS 2.0  HGB 10.0*  HCT 30.9*  MCV 94.5  PLT 192   Cardiac Enzymes: Troponin (Point of Care Test)  Recent Labs  02/05/16 1123  TROPIPOC 0.00   Cardiac Panel (last 3 results)  Recent Labs  02/05/16 1419 02/05/16 1944 02/06/16 0207  TROPONINI <0.03 <0.03 <0.03    Telemetry: Sinus rhythm.  Heart rate has improved and she has not had any significant severe bradycardia or pauses off AV nodal agents.  Assessment/Plan:  1.  Symptomatic bradycardia resolved 2.  Chronic kidney disease stage 3-4 3.  Hypertensive heart disease-blood pressure still somewhat elevated-could probably be managed as an outpatient.  Recommendations:  She looks good and wants to go home.  I think at her age and functional status could probably titrate her blood pressure medicines as an outpatient.  Would avoid AV nodal blocking agents but could titrate amlodipine up.  May consider adding a diuretic to her regimen if needed or she could go on  hydralazine.     Kerry Hough  MD [facility]  02/07/2016, 8:16 AM

## 2016-02-09 LAB — GLUCOSE, CAPILLARY: GLUCOSE-CAPILLARY: 140 mg/dL — AB (ref 65–99)

## 2016-02-10 ENCOUNTER — Telehealth: Payer: Self-pay | Admitting: *Deleted

## 2016-02-10 NOTE — Telephone Encounter (Signed)
WALK- IN  Pt's daughter in law presents to clarify disch instructions from [facility] Warfarin dosing, went by last visit with dr Elie Confer, w/ f/u appt 7/31 at 1415 Ask about clonidine patch, the pharmacy will have them today and they will pick them up, she has not had 1 on since she came to the hospital, should she restart these? Has had office visit and dr Maudie Mercury visit

## 2016-02-12 ENCOUNTER — Other Ambulatory Visit: Payer: Self-pay | Admitting: Internal Medicine

## 2016-02-13 NOTE — Telephone Encounter (Signed)
Per my last office visit back in June, glipizide XL 2.5 mg was discontinued given the concern for frailty and hypoglycemic events. This refill request appears outdated.  I am more inclined to reassess her follow-up next week to determine if she indeed needs an additional diabetic agent.

## 2016-02-18 ENCOUNTER — Encounter: Payer: Self-pay | Admitting: Internal Medicine

## 2016-02-18 ENCOUNTER — Other Ambulatory Visit: Payer: Self-pay | Admitting: Internal Medicine

## 2016-02-18 ENCOUNTER — Ambulatory Visit (INDEPENDENT_AMBULATORY_CARE_PROVIDER_SITE_OTHER): Payer: Medicare Other | Admitting: Internal Medicine

## 2016-02-18 VITALS — BP 150/52 | HR 61 | Temp 97.5°F | Ht 62.0 in | Wt 144.9 lb

## 2016-02-18 DIAGNOSIS — N183 Chronic kidney disease, stage 3 unspecified: Secondary | ICD-10-CM

## 2016-02-18 DIAGNOSIS — E1122 Type 2 diabetes mellitus with diabetic chronic kidney disease: Secondary | ICD-10-CM | POA: Diagnosis not present

## 2016-02-18 DIAGNOSIS — I10 Essential (primary) hypertension: Secondary | ICD-10-CM

## 2016-02-18 DIAGNOSIS — Z7189 Other specified counseling: Secondary | ICD-10-CM | POA: Diagnosis not present

## 2016-02-18 DIAGNOSIS — E1159 Type 2 diabetes mellitus with other circulatory complications: Secondary | ICD-10-CM

## 2016-02-18 NOTE — Patient Instructions (Addendum)
Please keep taking the medicines listed on this handout.   Let's see each other back in August. Please remember to bring in your sugar meter and medications.

## 2016-02-18 NOTE — Assessment & Plan Note (Signed)
Assessment Amidst her chronic illnesses, I think it is important we have an informed discussion about her values and what she finds most important should she come to the time when the severity of her illness limits the interventions available. Both she and her daughter are agreeable to this and would highly value an annual wellness visit.  Plan -Refer to schedule annual wellness visit

## 2016-02-18 NOTE — Progress Notes (Signed)
CC: High blood pressure  HPI:  [name] is a 82 y.o. female who presents with her daughter today for hypertension. Please see assessment & plan for status of chronic medical problems.  She and her daughter have brought with her what appears to be in infinite supply of medications which have been prescribed to her over the last several years and and has brought them with her today for [place] to dispose of them.  -Benazepril 2 bottles -Losartan 3 bottles -Colchicine x 1 bottle -Diltiazem x 3 bottles -Lasix x 4 bottles -Metoprolol 4 bottles -Glipizide 3 bottles -Clonidine 0.4 mg patches -Unknown medication 2 bottles   Past Medical History:  Diagnosis Date  . Anemia     normocytic anemia with baseline hemoglobin 10-11  . Blindness of left eye     likely related to stroke, left cataract removed from that eye with complications  . Calculus gallbladder and bile duct with cholecystitis with obstruction 01/2015  . Chronic kidney disease 2006    left renal artery stenosis with probable hemodynamic significance, kidneys are normal in morphology without focal lesions or hydronephrosis this is based but cannot A. of the abdomen with and without contrast done on the 31st 2006  . CKD (chronic kidney disease), stage III   . CVA (cerebral vascular accident) ([place]) 1990's  . CVA (cerebrovascular accident) [facility])  October 2007    CT of the head atrophy with multiple remote insults noted but no definite acute findings  . Glaucoma   . Gout   . Hyperlipidemia   . Hypertension   . Personal history of DVT (deep vein thrombosis) 08/11/2010   BLE  . Type II diabetes mellitus ([place])     Review of Systems:  Please see each problem below for a pertinent review of systems.  Physical Exam:  Vitals:   02/18/16 1429  BP: (!) 195/73  Pulse: 61  Weight: 144 lb 14.4 oz (65.7 kg)  Height: 5\' 2"  (1.575 m)   Constitutional: Elderly African-American female. No distress.  HEENT: Normocephalic  and atraumatic. Conjunctivae are normal.  Cardiovascular: Normal rate, regular rhythm and normal heart sounds.  No gallop, friction rub, murmur heard. Pulmonary/Chest: Effort normal. No respiratory distress. No wheezes, rales.  Abdominal: Soft. Bowel sounds are normal. No distension. No tenderness.  Neurological: Alert and oriented to person, place, and time.  Skin: Warm and dry. Not diaphoretic.  Psychiatric: Affect bright.    Assessment & Plan:   See Encounters Tab for problem based charting.  Hypertension associated with diabetes ([place]) Assessment She was hospitalized 02/05/16 through 02/07/16 for symptomatic bradycardia thought to be in the setting of double nodal blockade with metoprolol and diltiazem. She was discharged on hydralazine 10 mg 3 times daily and amlodipine 5mg  daily with a plan to wean off clonidine.  Her blood pressure today is elevated at 195/73 though improved on recheck to 150/52. They do not have any medications with them though the patient's daughter confirms with me using receipts that she is indeed taking amlodipine 5 mg, hydralazine 10 mg 3 times daily, and the clonidine patch [check confirmed with the pharmacy to be 0.2 mg/24 hours].   It was my understanding that diltiazem was started for proteinuria given that ARB therapy would be contraindicated with her degree of renal insufficiency, but her heart rate today of 61 makes me leery of starting back any nodal agent. She also acknowledges nonadherence to her medications though cannot recall the names of which one specifically, so I  wonder how adherent she is to 3 times daily dosing of hydralazine and how much success we can achieve with uptitrating this medication.  She does not report symptoms of hypertensive urgency, like headache, chest pain, difficulty breathing, blurry vision though cannot recall even the symptoms she experienced prompting her most recent hospitalization.   Plan -Continue clonidine 0.2 mg/24  hour patch. Ideally, we could consider weaning this medication down, but pill burden is a significant deterrent to her quality of life. -Continue hydralazine 10 mg 3 times daily -Continue amlodipine 5 mg despite the risk that this may worsen her proteinuria -Refer to social work to see if we can either get home health and/or palliative care involved given that quality of life consideration should take precedence amidst her chronic illnesses and difficulties performing ADLs [bathing, clothing, limited mobility] though she is currently receiving a home health aid  CKD (chronic kidney disease) stage 3, GFR 30-59 ml/min Assessment At discharge, her creatinine was elevated at 2.5, up from 1.5 back in March 2017. Over this interval, her GFR has trended down from the upper 30s to around the 20s.  She is scheduled to follow-up with Dr. Florene Glen, her nephrologist, in 2 weeks.  We will recheck lab work today to assess her renal function. I also think in light of her chronic illnesses, it may be preferable to initiate advance care planning to better coordinate care that aligns with her values and preferences.  Plan -Recheck BMET today -Refer to social work as noted under 'hypertension'   Type 2 diabetes mellitus ([place]) Assessment She is requesting a refill of test strips today.  Plan -Refilled test strips   Advance care planning Assessment Amidst her chronic illnesses, I think it is important we have an informed discussion about her values and what she finds most important should she come to the time when the severity of her illness limits the interventions available. Both she and her daughter are agreeable to this and would highly value an annual wellness visit.  Plan -Refer to schedule annual wellness visit   Patient discussed with Dr. Lynnae January

## 2016-02-18 NOTE — Assessment & Plan Note (Addendum)
Assessment She was hospitalized 02/05/16 through 02/07/16 for symptomatic bradycardia thought to be in the setting of double nodal blockade with metoprolol and diltiazem. She was discharged on hydralazine 10 mg 3 times daily and amlodipine 5mg  daily with a plan to wean off clonidine.  Her blood pressure today is elevated at 195/73 though improved on recheck to 150/52. They do not have any medications with them though the patient's daughter confirms with me using receipts that she is indeed taking amlodipine 5 mg, hydralazine 10 mg 3 times daily, and the clonidine patch [check confirmed with the pharmacy to be 0.2 mg/24 hours].   It was my understanding that diltiazem was started for proteinuria given that ARB therapy would be contraindicated with her degree of renal insufficiency, but her heart rate today of 61 makes me leery of starting back any nodal agent. She also acknowledges nonadherence to her medications though cannot recall the names of which one specifically, so I wonder how adherent she is to 3 times daily dosing of hydralazine and how much success we can achieve with uptitrating this medication.  She does not report symptoms of hypertensive urgency, like headache, chest pain, difficulty breathing, blurry vision though cannot recall even the symptoms she experienced prompting her most recent hospitalization.   Plan -Continue clonidine 0.2 mg/24 hour patch. Ideally, we could consider weaning this medication down, but pill burden is a significant deterrent to her quality of life. -Continue hydralazine 10 mg 3 times daily -Continue amlodipine 5 mg despite the risk that this may worsen her proteinuria -Refer to social work to see if we can either get home health and/or palliative care involved given that quality of life consideration should take precedence amidst her chronic illnesses and difficulties performing ADLs [bathing, clothing, limited mobility] though she is currently receiving a home  health aid  ADDENDUM 02/19/2016  8:44 AM:  Creatinine 1.9, GFR 28, improved from creatinine 2.5, GFR 19 following discharge 02/06/16.  Glucose elevated at 185 though we'll discuss glycemic control at follow-up next month.

## 2016-02-18 NOTE — Assessment & Plan Note (Signed)
Assessment At discharge, her creatinine was elevated at 2.5, up from 1.5 back in March 2017. Over this interval, her GFR has trended down from the upper 30s to around the 20s.  She is scheduled to follow-up with Dr. Florene Glen, her nephrologist, in 2 weeks.  We will recheck lab work today to assess her renal function. I also think in light of her chronic illnesses, it may be preferable to initiate advance care planning to better coordinate care that aligns with her values and preferences.  Plan -Recheck BMET today -Refer to social work as noted under 'hypertension'

## 2016-02-18 NOTE — Assessment & Plan Note (Signed)
Assessment She is requesting a refill of test strips today.  Plan -Refilled test strips

## 2016-02-19 ENCOUNTER — Telehealth: Payer: Self-pay | Admitting: Licensed Clinical Social Worker

## 2016-02-19 DIAGNOSIS — E1159 Type 2 diabetes mellitus with other circulatory complications: Secondary | ICD-10-CM

## 2016-02-19 DIAGNOSIS — I10 Essential (primary) hypertension: Secondary | ICD-10-CM

## 2016-02-19 DIAGNOSIS — N183 Chronic kidney disease, stage 3 unspecified: Secondary | ICD-10-CM

## 2016-02-19 LAB — BMP8+ANION GAP
ANION GAP: 17 mmol/L (ref 10.0–18.0)
BUN/Creatinine Ratio: 12 (ref 12–28)
BUN: 23 mg/dL (ref 8–27)
CALCIUM: 9 mg/dL (ref 8.7–10.3)
CHLORIDE: 107 mmol/L — AB (ref 96–106)
CO2: 20 mmol/L (ref 18–29)
Creatinine, Ser: 1.85 mg/dL — ABNORMAL HIGH (ref 0.57–1.00)
GFR calc Af Amer: 28 mL/min/{1.73_m2} — ABNORMAL LOW (ref >59)
GFR calc non Af Amer: 24 mL/min/{1.73_m2} — ABNORMAL LOW (ref >59)
GLUCOSE: 185 mg/dL — AB (ref 65–99)
Potassium: 4.8 mmol/L (ref 3.5–5.2)
Sodium: 144 mmol/L (ref 134–144)

## 2016-02-19 NOTE — Addendum Note (Signed)
Addended by: Riccardo Dubin on: 02/19/2016 06:03 PM   Modules accepted: Orders

## 2016-02-19 NOTE — Telephone Encounter (Signed)
CSW received incoming call from Ms. Stills's daughter-in-law, Keonia Rigmaiden.  CSW inquired about pt's current resources.  Pt receives PCS 7 days a week for 2-3hr/day.  This amount reflects about 80hrs/month.  Family states pt has had a decline in her short term memory and will often forget things and make accusations towards family members.  May need to explore Change of Status in the near future to request additional PCS hours, as family inquired about pt's needs for more hours.   CSW discussed community resources such as [facility] RN and ST, as well as home based palliative care.  Family is in agreement with both should PCP place referral.  Pt has not had [facility] this year and has no preference of agency.  Family aware CSW will mail information regarding  Home Based Palliative care.

## 2016-02-19 NOTE — Telephone Encounter (Signed)
I can place [facility] referral. How do I refer to palliative care?

## 2016-02-19 NOTE — Telephone Encounter (Signed)
Ms. Wehrheim was referred to CSW for community resources which potentially include: [place], Home Based Palliative Care.  CSW placed called to pt.  CSW left message requesting return call. CSW provided contact hours and phone number.

## 2016-02-19 NOTE — Progress Notes (Signed)
Internal Medicine Clinic Attending  Case discussed with Dr. Patel,Rushil at the time of the visit.  We reviewed the resident's history and exam and pertinent patient test results.  I agree with the assessment, diagnosis, and plan of care documented in the resident's note.  

## 2016-02-20 NOTE — Telephone Encounter (Signed)
Ambulatory referral to home based palliative care

## 2016-02-20 NOTE — Addendum Note (Signed)
Addended by: Riccardo Dubin on: 02/20/2016 05:47 PM   Modules accepted: Orders

## 2016-02-20 NOTE — Telephone Encounter (Signed)
I placed the referral. Please let me know how else I can assist.

## 2016-02-23 ENCOUNTER — Ambulatory Visit (INDEPENDENT_AMBULATORY_CARE_PROVIDER_SITE_OTHER): Payer: Medicare Other | Admitting: Pharmacist

## 2016-02-23 ENCOUNTER — Encounter (INDEPENDENT_AMBULATORY_CARE_PROVIDER_SITE_OTHER): Payer: Self-pay

## 2016-02-23 ENCOUNTER — Other Ambulatory Visit: Payer: Self-pay | Admitting: Pharmacist

## 2016-02-23 DIAGNOSIS — E1159 Type 2 diabetes mellitus with other circulatory complications: Secondary | ICD-10-CM

## 2016-02-23 DIAGNOSIS — Z7901 Long term (current) use of anticoagulants: Secondary | ICD-10-CM

## 2016-02-23 DIAGNOSIS — I10 Essential (primary) hypertension: Secondary | ICD-10-CM | POA: Diagnosis not present

## 2016-02-23 DIAGNOSIS — Z86718 Personal history of other venous thrombosis and embolism: Secondary | ICD-10-CM | POA: Diagnosis not present

## 2016-02-23 DIAGNOSIS — E1122 Type 2 diabetes mellitus with diabetic chronic kidney disease: Secondary | ICD-10-CM | POA: Diagnosis not present

## 2016-02-23 DIAGNOSIS — M1A3710 Chronic gout due to renal impairment, right ankle and foot, without tophus (tophi): Secondary | ICD-10-CM

## 2016-02-23 LAB — POCT INR: INR: 1.9

## 2016-02-23 MED ORDER — SITAGLIPTIN PHOSPHATE 25 MG PO TABS
25.0000 mg | ORAL_TABLET | Freq: Every day | ORAL | 1 refills | Status: DC
Start: 2016-02-23 — End: 2016-08-21

## 2016-02-23 MED ORDER — WARFARIN SODIUM 5 MG PO TABS: ORAL_TABLET | ORAL | 3 refills | Status: DC

## 2016-02-23 NOTE — Telephone Encounter (Signed)
As this patient was seen by me and deemed necessary for their treatment, I will refill this prescription for sitaglipin

## 2016-02-23 NOTE — Patient Instructions (Signed)
Patient educated about medication as defined in this encounter and verbalized understanding by repeating back instructions provided.   

## 2016-02-23 NOTE — Progress Notes (Signed)
Anticoagulation Management Chelsea Mcdaniel is a 82 y.o. female who reports to the clinic for monitoring of warfarin treatment.    Indication: history of DVTs and CVAs Duration: indefinite  Anticoagulation Clinic Visit History: Patient does not report signs/symptoms of bleeding or thromboembolism or any other changes. Patient does state she has missed dose(s) of warfarin this past week but does not know when/how many.  Anticoagulation Episode Summary    Current INR goal:   2.0-3.0  TTR:   60.3 % (2.1 y)  Next INR check:   03/22/2016  INR from last check:   1.9! (02/23/2016)  Weekly max dose:     Target end date:     INR check location:     Preferred lab:     Send INR reminders to:      Indications   EMBOLISM/THROMBOSIS DEEP VSL LWR EXTRM NOS (Resolved) [I82.409] Long-term (current) use of anticoagulants [Z79.01]       Comments:          ASSESSMENT Recent Results: The most recent result is correlated with 45 mg per week: Lab Results  Component Value Date   INR 1.9 02/23/2016   INR 2.94 (H) 02/07/2016   INR 2.94 (H) 02/06/2016   Anticoagulation Dosing: INR as of 02/23/2016 and Previous Dosing Information    INR Dt INR Goal Wkly Tot Sun Mon Tue Wed Thu Fri Sat   02/23/2016 1.9 2.0-3.0 45 mg 7.5 mg 5 mg 7.5 mg 5 mg 7.5 mg 5 mg 7.5 mg   Patient deviated from recommended dosing.       Anticoagulation Dose Instructions as of 02/23/2016      Total Sun Mon Tue Wed Thu Fri Sat   New Dose 45 mg 7.5 mg 5 mg 7.5 mg 5 mg 7.5 mg 5 mg 7.5 mg     (5 mg x 1.5)  (5 mg x 1)  (5 mg x 1.5)  (5 mg x 1)  (5 mg x 1.5)  (5 mg x 1)  (5 mg x 1.5)                           INR today: Subtherapeutic  PLAN Weekly dose was unchanged due to missed doses. Education provided on the importance of adherence. Patient states that she would like to transfer prescriptions to [facility] for help with adherence. Facilitated prescription transfers for patient.  Patient Instructions  Patient  educated about medication as defined in this encounter and verbalized understanding by repeating back instructions provided.    Patient advised to contact clinic or seek medical attention if signs/symptoms of bleeding or thromboembolism occur.  Patient verbalized understanding by repeating back information and was advised to contact me if further medication-related questions arise. Patient was also provided an information handout.  Follow-up Return in about 4 weeks (around 03/22/2016) for Follow up INR 03/22/2016 around 3pm.  Chelsea Mcdaniel  15 minutes spent face-to-face with the patient during the encounter. 50% of time spent on education. 50% of time was spent on assessment and plan.

## 2016-02-24 ENCOUNTER — Ambulatory Visit (INDEPENDENT_AMBULATORY_CARE_PROVIDER_SITE_OTHER): Payer: Medicare Other | Admitting: Internal Medicine

## 2016-02-24 ENCOUNTER — Encounter: Payer: Self-pay | Admitting: Licensed Clinical Social Worker

## 2016-02-24 ENCOUNTER — Telehealth: Payer: Self-pay

## 2016-02-24 VITALS — BP 145/64 | Temp 97.8°F | Wt 148.9 lb

## 2016-02-24 DIAGNOSIS — K5900 Constipation, unspecified: Secondary | ICD-10-CM | POA: Diagnosis not present

## 2016-02-24 DIAGNOSIS — M1A3710 Chronic gout due to renal impairment, right ankle and foot, without tophus (tophi): Secondary | ICD-10-CM

## 2016-02-24 MED ORDER — POLYETHYLENE GLYCOL 3350 17 GM/SCOOP PO POWD: 1.0000 | Freq: Once | ORAL | 0 refills | Status: DC

## 2016-02-24 MED ORDER — POLYETHYLENE GLYCOL 3350 17 G PO PACK: 17.0000 g | PACK | ORAL | Status: DC | PRN

## 2016-02-24 NOTE — Assessment & Plan Note (Signed)
Prescribed Miralax 17g as needed for constipation.

## 2016-02-24 NOTE — Telephone Encounter (Signed)
Daleen Snook from [place] requesting to speak with a nurse regarding VO.

## 2016-02-24 NOTE — Progress Notes (Signed)
   CC: constipation  HPI:  Ms.Amiria Jerilynn Mages Stobaugh is a 82 y.o. female with PMH below who presents for an acute visit for constipation.  She recently saw her PCP last week.  She is a poor historian, her daughter is present. She reports she has been constipated for the last 5 days.  She denies any pain but feels that she should be having more regular bowel movements.  She denies any straining.  She reports that she takes milk of magnesia, Doculax, and benefiber.  She cannot give me any details about how frequently she takes the medicines or the last time she took these medications.  Daughter confirms that they did purchase Doculax and benefiber for her.  She feels that these are not working.  Past Medical History:  Diagnosis Date  . Anemia     normocytic anemia with baseline hemoglobin 10-11  . Blindness of left eye     likely related to stroke, left cataract removed from that eye with complications  . Calculus gallbladder and bile duct with cholecystitis with obstruction 01/2015  . Chronic kidney disease 2006    left renal artery stenosis with probable hemodynamic significance, kidneys are normal in morphology without focal lesions or hydronephrosis this is based but cannot A. of the abdomen with and without contrast done on the 31st 2006  . CKD (chronic kidney disease), stage III   . CVA (cerebral vascular accident) ([place]) 1990's  . CVA (cerebrovascular accident) [facility])  October 2007    CT of the head atrophy with multiple remote insults noted but no definite acute findings  . Glaucoma   . Gout   . Hyperlipidemia   . Hypertension   . Personal history of DVT (deep vein thrombosis) 08/11/2010   BLE  . Type II diabetes mellitus ([facility])     Review of Systems:   Denies nausea, vomiting and abdominal pain  Physical Exam:  Vitals:   02/24/16 1500  BP: (!) 145/64  Temp: 97.8 F (36.6 C)  Weight: 148 lb 14.4 oz (67.5 kg)   Physical Exam  Constitutional: She is well-developed, well-nourished,  and in no distress.  HENT:  Mouth/Throat: Oropharynx is clear and moist.  Cardiovascular: Normal rate and regular rhythm.   Abdominal: Soft. Bowel sounds are normal. She exhibits no distension. There is no tenderness.  Musculoskeletal: She exhibits edema (ankles).  Nursing note and vitals reviewed.    Assessment & Plan:   See encounters tab for problem based medical decision making.   Patient seen with Dr. Dareen Piano

## 2016-02-24 NOTE — Patient Instructions (Signed)
Ms. Greenhalgh please take Miralax as needed for constipation.   Please take it once today and wait one day before taking it again.  If you do not have a bowel movement in the next 2-3 days please call the clinic or if you develop any nausea, vomiting or abdominal pain please call the clinic.

## 2016-02-25 MED ORDER — ALLOPURINOL 100 MG PO TABS: 150.0000 mg | ORAL_TABLET | Freq: Every day | ORAL | 3 refills | Status: DC

## 2016-02-25 MED ORDER — HYDRALAZINE HCL 10 MG PO TABS: 10.0000 mg | ORAL_TABLET | Freq: Three times a day (TID) | ORAL | 11 refills | Status: DC

## 2016-02-25 MED ORDER — SENNOSIDES-DOCUSATE SODIUM 8.6-50 MG PO TABS
2.0000 | ORAL_TABLET | Freq: Every evening | ORAL | 3 refills | Status: DC | PRN
Start: 2016-02-25 — End: 2016-07-16

## 2016-02-25 MED ORDER — CLONIDINE HCL 0.2 MG/24HR TD PTWK: 0.2000 mg | MEDICATED_PATCH | TRANSDERMAL | 11 refills | Status: DC

## 2016-02-25 MED ORDER — AMLODIPINE BESYLATE 5 MG PO TABS: 5.0000 mg | ORAL_TABLET | Freq: Every day | ORAL | 3 refills | Status: DC

## 2016-02-25 NOTE — Progress Notes (Signed)
INTERNAL MEDICINE TEACHING ATTENDING ADDENDUM - Krystal Teachey M.D  Duration- indefinite, Indication- recurrent VTE, INR- subtherapeutic. Agree with pharmacy recommendations as outlined in their note.   

## 2016-02-25 NOTE — Telephone Encounter (Signed)
Call from [facility]. 1. Requesting verbal order for SW and OT; states pt has weakness of her hands. VO given - if not ok, let me know.  2. States level 2 interaction between Allopurinol and Warfarin; also between Clonidine and Cosopt eye drops.  3.Are there any BP parameters you want called to you?  4. Refill on meds - sent to [facility], so they can do pill packs for the pt.  Thanks

## 2016-02-25 NOTE — Telephone Encounter (Signed)
#  1 I will do a verbal order for social work and OT.  #2 thank you for notifying me of level II interactions for these medications.  #3 If blood pressure is greater than 150/90, please recheck blood pressure. Also take note of which blood pressures have been recorded as they have been discordant.  #4 I will send in the refilles.

## 2016-02-26 NOTE — Telephone Encounter (Signed)
Call Daleen Snook from [facility] - no answer ; left message "verbal order for social work and OT" and "If blood pressure is greater than 150/90, please recheck blood pressure" per Dr Posey Pronto. Andcall if has any questions.

## 2016-02-27 ENCOUNTER — Telehealth: Payer: Self-pay | Admitting: Internal Medicine

## 2016-02-27 DIAGNOSIS — R531 Weakness: Secondary | ICD-10-CM | POA: Diagnosis not present

## 2016-02-27 DIAGNOSIS — E1122 Type 2 diabetes mellitus with diabetic chronic kidney disease: Secondary | ICD-10-CM | POA: Diagnosis not present

## 2016-02-27 DIAGNOSIS — I10 Essential (primary) hypertension: Secondary | ICD-10-CM | POA: Diagnosis not present

## 2016-02-27 NOTE — Telephone Encounter (Signed)
NP from [facility] requesting orders for [facility].

## 2016-02-27 NOTE — Telephone Encounter (Signed)
[facility] OT requesting okay for an eval for next week due to delay in eval

## 2016-03-01 ENCOUNTER — Telehealth: Payer: Self-pay

## 2016-03-01 ENCOUNTER — Ambulatory Visit
Admission: RE | Admit: 2016-03-01 | Discharge: 2016-03-01 | Disposition: A | Discharge status: Home / Self Care | Payer: Medicare Other | Source: Ambulatory Visit | Attending: Nephrology | Admitting: Nephrology

## 2016-03-01 ENCOUNTER — Telehealth: Payer: Self-pay | Admitting: Internal Medicine

## 2016-03-01 DIAGNOSIS — R1310 Dysphagia, unspecified: Secondary | ICD-10-CM

## 2016-03-01 NOTE — Telephone Encounter (Signed)
Don from [place] requesting VO. Please call back.

## 2016-03-01 NOTE — Telephone Encounter (Signed)
Request for evaluation for this week there is a delay from last week

## 2016-03-01 NOTE — Telephone Encounter (Signed)
LVM to return call.

## 2016-03-01 NOTE — Telephone Encounter (Signed)
Calling Chelsea Mcdaniel back

## 2016-03-02 ENCOUNTER — Other Ambulatory Visit: Payer: Self-pay | Admitting: *Deleted

## 2016-03-02 DIAGNOSIS — I10 Essential (primary) hypertension: Secondary | ICD-10-CM | POA: Diagnosis not present

## 2016-03-02 DIAGNOSIS — E1122 Type 2 diabetes mellitus with diabetic chronic kidney disease: Secondary | ICD-10-CM | POA: Diagnosis not present

## 2016-03-02 NOTE — Telephone Encounter (Signed)
VO given.

## 2016-03-02 NOTE — Telephone Encounter (Signed)
[facility] OT calls for a VO for a delay in eval from last week due to special circumstances, vo given

## 2016-03-02 NOTE — Telephone Encounter (Signed)
Received faxed refill request from pt's pharmacy for her warfarin 5mg  tabs.  Per pt's electronic medical record- rx was written on 02/23/2016 with additional refills.  Pharmacy states they never received rx-verbal given over the phone.  Phone call complete.Despina Hidden Cassady8/8/20173:26 PM

## 2016-03-03 ENCOUNTER — Other Ambulatory Visit: Payer: Self-pay | Admitting: *Deleted

## 2016-03-03 DIAGNOSIS — I10 Essential (primary) hypertension: Secondary | ICD-10-CM | POA: Diagnosis not present

## 2016-03-03 DIAGNOSIS — E1122 Type 2 diabetes mellitus with diabetic chronic kidney disease: Secondary | ICD-10-CM | POA: Diagnosis not present

## 2016-03-03 NOTE — Telephone Encounter (Signed)
error 

## 2016-03-03 NOTE — Telephone Encounter (Signed)
Calling from the home to report a blood pressure of 210/80. Asked Chelsea Mcdaniel to repeat and it was 202/84 at rest . Home health nurse reported to him recent B/P of 174/84. Don to stay in home for 30 minutes and recheck then call back with result. Recheck was 1889/74. Patient asymptomatic. B/P log from patient is  8/9: 178/73 8/8: 174/80 8/7: 190/74 8/6: 171/69 8/4: 189/75  Added patient to [facility] for tomorrow for B/P evaluation. Advised to go to ED for severe HA, chest pain, SOb, or signs of stroke.

## 2016-03-04 ENCOUNTER — Encounter: Payer: Self-pay | Admitting: Internal Medicine

## 2016-03-04 ENCOUNTER — Ambulatory Visit (INDEPENDENT_AMBULATORY_CARE_PROVIDER_SITE_OTHER): Payer: Medicare Other | Admitting: Internal Medicine

## 2016-03-04 VITALS — BP 181/69 | HR 56 | Temp 97.6°F | Ht 62.0 in | Wt 148.4 lb

## 2016-03-04 DIAGNOSIS — I10 Essential (primary) hypertension: Principal | ICD-10-CM

## 2016-03-04 DIAGNOSIS — Z79899 Other long term (current) drug therapy: Secondary | ICD-10-CM

## 2016-03-04 DIAGNOSIS — E1159 Type 2 diabetes mellitus with other circulatory complications: Secondary | ICD-10-CM

## 2016-03-04 DIAGNOSIS — I152 Hypertension secondary to endocrine disorders: Secondary | ICD-10-CM

## 2016-03-04 DIAGNOSIS — K5900 Constipation, unspecified: Secondary | ICD-10-CM | POA: Diagnosis not present

## 2016-03-04 MED ORDER — POLYETHYLENE GLYCOL 3350 17 GM/SCOOP PO POWD: 17.0000 g | Freq: Every day | ORAL | Status: DC | PRN

## 2016-03-04 MED ORDER — AMLODIPINE BESYLATE 10 MG PO TABS: 10.0000 mg | ORAL_TABLET | Freq: Every day | ORAL | 2 refills | Status: DC

## 2016-03-04 NOTE — Progress Notes (Signed)
    CC: HTN  HPI: [name] is a 82 y.o. female with PMHx of HTN, T2DM, CKD IV, HLD who presents to the clinic for follow up for HTN.  Patient is on clonidine 0.2 mg patch, hydralazine 10 mg TID and amlodipine 5 mg daily at home. Patient is not on a nodal agent such as diltiazem or BB due to bradycardia. She was recently hospitalized for this problem and taken off of both. Her HR last clinic visit was 5. Her PCP's note indicates that he would ideally like to wean her off clonidine, but pill burden is a limiting factor. It is unclear how compliant patient is with her current regimen. Patient returns today given BP readings by [facility] at home have been elevated- On 8/7, 210/80. Repeat was 202/84 at rest. After 30 minutes, BP was 189/74. Patient was asymptomatic. B/P log from patient is  8/9: 178/73 8/8: 174/80 8/7: 190/74 8/6: 171/69 8/4: 189/75  Today, BP is 192/63 with HR of 59. Patient and caretaker admit compliance with all medications. She denies chest pain, shortness of breath, lightheadedness, vision changes.  Past Medical History:  Diagnosis Date  . Anemia     normocytic anemia with baseline hemoglobin 10-11  . Blindness of left eye     likely related to stroke, left cataract removed from that eye with complications  . Calculus gallbladder and bile duct with cholecystitis with obstruction 01/2015  . Chronic kidney disease 2006    left renal artery stenosis with probable hemodynamic significance, kidneys are normal in morphology without focal lesions or hydronephrosis this is based but cannot A. of the abdomen with and without contrast done on the 31st 2006  . CKD (chronic kidney disease), stage III   . CVA (cerebral vascular accident) ([place]) 1990's  . CVA (cerebrovascular accident) [facility])  October 2007    CT of the head atrophy with multiple remote insults noted but no definite acute findings  . Glaucoma   . Gout   . Hyperlipidemia   . Hypertension   . Personal history of DVT  (deep vein thrombosis) 08/11/2010   BLE  . Type II diabetes mellitus ([place])     Review of Systems: A complete ROS was negative except as noted in HPI.   Physical Exam: Vitals:   03/04/16 1333  BP: (!) 192/63  Pulse: (!) 59  Temp: 97.6 F (36.4 C)  TempSrc: Oral  SpO2: 98%  Weight: 148 lb 6.4 oz (67.3 kg)  Height: 5\' 2"  (1.575 m)   General: Vital signs reviewed.  Patient is elderly, in no acute distress and cooperative with exam.  Cardiovascular: Bradycardic, regular rhythm, 2/6 systolic murmur. Pulmonary/Chest: Clear to auscultation bilaterally, no wheezes, rales, or rhonchi. Abdominal: Soft, non-tender, non-distended, BS + Extremities: 1+ pitting lower extremity edema bilaterally to ankles Skin: Warm, dry and intact. No rashes or erythema. Psychiatric: Normal mood and affect. speech and behavior is normal. Cognition and memory are abnormal.   Assessment & Plan:  See encounters tab for problem based medical decision making. Patient discussed with Dr. Daryll Drown

## 2016-03-04 NOTE — Telephone Encounter (Signed)
review 

## 2016-03-04 NOTE — Patient Instructions (Signed)
FOR YOUR HIGH BLOOD PRESSURE, INCREASE YOUR AMLODIPINE TO 10 MG PER DAY (YOU CAN TAKE 2 OF THE 5 MG PILLS ONCE A DAY UNTIL YOU RUN OUT, THEN THE NEW PRESCRIPTION WILL BE AVAILABLE TO YOU)  CONTINUE USING THE CLONIDINE PATCH AND THE HYDRALAZINE 10 MG THREE TIMES A DAY. PLEASE FOLLOW UP ON 8/25. IF YOUR BLOOD PRESSURE REMAINS ELEVATED, WE MAY INCREASE YOUR HYDRALAZINE.  FOR YOUR CONSTIPATION, USE THE MIRALAX ONCE A DAY. STOP IF YOU DEVELOP DIARRHEA. IF THE MIRALAX DOES NOT IMPROVE YOUR SYMPTOMS YOU CAN TAKE SENNA-DUCOSATE PILL AT NIGHT.

## 2016-03-04 NOTE — Assessment & Plan Note (Signed)
Patient reports minimally improved constipation. She has a bowel movement every 2 to 3 days. She denies nausea, vomiting, abdominal pain, diarrhea or blood in her stool. She is unsure what she has been taking for constipation other than scoop of powder in her coffee every morning (Miralax versus Benefiber?). Abdominal exam is benign.  Plan: -Miralax QD (hold for loose stools) -Continue Benefiber -If persistent constipation despite miralax, try Senokot-S QHS prn

## 2016-03-04 NOTE — Assessment & Plan Note (Addendum)
  Patient is on clonidine 0.2 mg patch, hydralazine 10 mg TID and amlodipine 5 mg daily at home. Patient is not on a nodal agent such as diltiazem or BB due to bradycardia. She was recently hospitalized for this problem and taken off of both. Her HR last clinic visit was 23. Her PCP's note indicates that he would ideally like to wean her off clonidine, but pill burden is a limiting factor. It is unclear how compliant patient is with her current regimen. Patient returns today given BP readings by [facility] at home have been elevated- On 8/7, 210/80. Repeat was 202/84 at rest. After 30 minutes, BP was 189/74. Patient was asymptomatic. B/P log from patient is  8/9: 178/73 8/8: 174/80 8/7: 190/74 8/6: 171/69 8/4: 189/75  Today, BP is 192/63 with HR of 59. Patient and caretaker admit compliance with all medications. She denies chest pain, shortness of breath, lightheadedness, vision changes.  Plan: -Increase amlodipine to 10 mg daily -Continue clonidine 0.2 mg patch Qweek -Continue hydralazine 10 mg TID -Follow up in 2 weeks with PCP -Will likely need titration of hydralazine to 25 mg TID at that time

## 2016-03-05 NOTE — Telephone Encounter (Signed)
Thank you for reaching out. Her blood pressures are incredibly labile, and I wonder if there's an issue with how the measurements are taken as I suggested in my last note.  Next time, can we have readings taken from each arm documented?

## 2016-03-05 NOTE — Progress Notes (Signed)
Internal Medicine Clinic Attending  I saw and evaluated the patient.  I personally confirmed the key portions of the history and exam documented by Dr. Hoffman and I reviewed pertinent patient test results.  The assessment, diagnosis, and plan were formulated together and I agree with the documentation in the resident's note.      

## 2016-03-08 ENCOUNTER — Telehealth: Payer: Self-pay | Admitting: Licensed Clinical Social Worker

## 2016-03-08 NOTE — Telephone Encounter (Addendum)
CSW received call from Ms. Essie Christine requesting time to complete Healthcare Advanced Directives.  Pt in need of completion/notarization of Advanced Directives.  Family informed of CSW work hours and availability.  Pt has an [facility] appointment scheduled on 03/19/16, pt to arrive 30 min prior to appointment to meet with CSW for completion of HCPOA.

## 2016-03-09 ENCOUNTER — Telehealth: Payer: Self-pay | Admitting: *Deleted

## 2016-03-09 DIAGNOSIS — I10 Essential (primary) hypertension: Secondary | ICD-10-CM | POA: Diagnosis not present

## 2016-03-09 DIAGNOSIS — E1122 Type 2 diabetes mellitus with diabetic chronic kidney disease: Secondary | ICD-10-CM | POA: Diagnosis not present

## 2016-03-09 NOTE — Telephone Encounter (Signed)
From [place]. Please  Call.

## 2016-03-09 NOTE — Telephone Encounter (Signed)
PT calls and plan is 1x week for 3 weeks for ADL's and fall prevention, he stated her BP was up when he arrived because she had just arrived home and struggled to get her jacket off, BP 188/89 after 15 mins it was rechecked and was 164/69. She stated she feels fine. VO given for PT services, do you agree?

## 2016-03-10 DIAGNOSIS — E1122 Type 2 diabetes mellitus with diabetic chronic kidney disease: Secondary | ICD-10-CM | POA: Diagnosis not present

## 2016-03-10 DIAGNOSIS — I10 Essential (primary) hypertension: Secondary | ICD-10-CM | POA: Diagnosis not present

## 2016-03-10 DIAGNOSIS — N183 Chronic kidney disease, stage 3 (moderate): Secondary | ICD-10-CM | POA: Diagnosis not present

## 2016-03-10 DIAGNOSIS — R809 Proteinuria, unspecified: Secondary | ICD-10-CM | POA: Diagnosis not present

## 2016-03-10 NOTE — Telephone Encounter (Signed)
Yes, I agree with PT.  As for the blood pressure, I think we will need to titrate cautiously and will address at follow-up.

## 2016-03-11 ENCOUNTER — Telehealth: Payer: Self-pay

## 2016-03-11 NOTE — Telephone Encounter (Signed)
Thank you letting us know. She is being followed by [facility]osp[facility] and [place]. Can we let them know as well?  I'm also including recent updates regarding her goals of care and code status under the advance care planning section on Epic.

## 2016-03-11 NOTE — Telephone Encounter (Signed)
Pt daughter in law needs to speak with a nurse regarding constipation. Please call pt back.

## 2016-03-11 NOTE — Telephone Encounter (Signed)
Spoke with [name] who states the aide told her that the patient has not had a BM in 14 days. When looking in the notes from office visit on 03/04/2016 patient reported problems with constipation, but BM every 3-4 days. Asked if patient was taking Mira lax, Benefiber, and Senokot per last note, she stated that she did not think she was doing all three at once. Encouraged her to have patient try this and to back off the Senokot first if bowels became more regular. Cautioned to stop Mira lax and Senokot if patient developed diarrhea. She agrees.

## 2016-03-11 NOTE — ACP (Advance Care Planning) (Signed)
I have reviewed the office notes from Wynell Balloon, NP [facility]]  for the consult dated 02/27/16:  MOST form was completed in which she would only come to the hospital only for pain control in the setting of a fall.   Her code status was determined to be DNR with comfort care measures.

## 2016-03-12 NOTE — Telephone Encounter (Signed)
LVM with Archie Endo updating her about plan of care

## 2016-03-15 DIAGNOSIS — E1122 Type 2 diabetes mellitus with diabetic chronic kidney disease: Secondary | ICD-10-CM | POA: Diagnosis not present

## 2016-03-15 DIAGNOSIS — I10 Essential (primary) hypertension: Secondary | ICD-10-CM | POA: Diagnosis not present

## 2016-03-15 NOTE — Progress Notes (Signed)
Internal Medicine Clinic Attending  Case discussed with Dr. Burns at the time of the visit.  We reviewed the resident's history and exam and pertinent patient test results.  I agree with the assessment, diagnosis, and plan of care documented in the resident's note.  

## 2016-03-16 ENCOUNTER — Other Ambulatory Visit: Payer: Self-pay | Admitting: Internal Medicine

## 2016-03-16 NOTE — Telephone Encounter (Signed)
As this patient was seen by me and deemed necessary for their treatment, I will refill this prescription for MiraLax

## 2016-03-17 DIAGNOSIS — E1122 Type 2 diabetes mellitus with diabetic chronic kidney disease: Secondary | ICD-10-CM | POA: Diagnosis not present

## 2016-03-17 DIAGNOSIS — I10 Essential (primary) hypertension: Secondary | ICD-10-CM | POA: Diagnosis not present

## 2016-03-17 NOTE — Progress Notes (Signed)
   CC: Hypertension  HPI:  Chelsea Mcdaniel is a 82 y.o. female who presents today with her daughter for hypertension. Please see assessment & plan for status of chronic medical problems.   Reassess glipizide response: low CBG?  Constipation  Past Medical History:  Diagnosis Date  . Anemia     normocytic anemia with baseline hemoglobin 10-11  . Blindness of left eye     likely related to stroke, left cataract removed from that eye with complications  . Calculus gallbladder and bile duct with cholecystitis with obstruction 01/2015  . Chronic kidney disease 2006    left renal artery stenosis with probable hemodynamic significance, kidneys are normal in morphology without focal lesions or hydronephrosis this is based but cannot A. of the abdomen with and without contrast done on the 31st 2006  . CKD (chronic kidney disease), stage III   . CVA (cerebral vascular accident) ([place]) 1990's  . CVA (cerebrovascular accident) [facility])  October 2007    CT of the head atrophy with multiple remote insults noted but no definite acute findings  . Glaucoma   . Gout   . Hyperlipidemia   . Hypertension   . Personal history of DVT (deep vein thrombosis) 08/11/2010   BLE  . Type II diabetes mellitus ([place])     Review of Systems:  Please see each problem below for a pertinent review of systems.  Physical Exam:  Vitals:   03/19/16 1323  BP: (!) 160/61  Pulse: 64  Temp: 98.1 F (36.7 C)  TempSrc: Oral  SpO2: 99%  Weight: 152 lb 8 oz (69.2 kg)   Physical Exam  Constitutional: She is well-developed, well-nourished, and in no distress.  HENT:  Mouth/Throat: Oropharynx is clear and moist.  Cardiovascular: Normal rate and regular rhythm.   Pulmonary/Chest: Effort normal. No respiratory distress.  Musculoskeletal: She exhibits edema (Bilateral, right foot worse than left).  Nursing note and vitals reviewed.    Assessment & Plan:   See Encounters Tab for problem based charting.  Patient  discussed with Dr. Lynnae January

## 2016-03-19 ENCOUNTER — Ambulatory Visit (INDEPENDENT_AMBULATORY_CARE_PROVIDER_SITE_OTHER): Payer: Medicare Other | Admitting: Internal Medicine

## 2016-03-19 ENCOUNTER — Encounter: Payer: Self-pay | Admitting: Licensed Clinical Social Worker

## 2016-03-19 ENCOUNTER — Other Ambulatory Visit: Payer: Self-pay

## 2016-03-19 ENCOUNTER — Encounter: Payer: Self-pay | Admitting: Internal Medicine

## 2016-03-19 ENCOUNTER — Ambulatory Visit (INDEPENDENT_AMBULATORY_CARE_PROVIDER_SITE_OTHER): Payer: Medicare Other | Admitting: Pharmacist

## 2016-03-19 VITALS — BP 160/61 | HR 64 | Temp 98.1°F | Wt 152.5 lb

## 2016-03-19 DIAGNOSIS — Z7189 Other specified counseling: Secondary | ICD-10-CM

## 2016-03-19 DIAGNOSIS — E1122 Type 2 diabetes mellitus with diabetic chronic kidney disease: Secondary | ICD-10-CM

## 2016-03-19 DIAGNOSIS — I152 Hypertension secondary to endocrine disorders: Secondary | ICD-10-CM

## 2016-03-19 DIAGNOSIS — Z7901 Long term (current) use of anticoagulants: Secondary | ICD-10-CM

## 2016-03-19 DIAGNOSIS — I10 Essential (primary) hypertension: Principal | ICD-10-CM

## 2016-03-19 DIAGNOSIS — Z86718 Personal history of other venous thrombosis and embolism: Secondary | ICD-10-CM

## 2016-03-19 DIAGNOSIS — K5900 Constipation, unspecified: Secondary | ICD-10-CM

## 2016-03-19 DIAGNOSIS — N183 Chronic kidney disease, stage 3 unspecified: Secondary | ICD-10-CM

## 2016-03-19 DIAGNOSIS — Z7984 Long term (current) use of oral hypoglycemic drugs: Secondary | ICD-10-CM

## 2016-03-19 DIAGNOSIS — E1159 Type 2 diabetes mellitus with other circulatory complications: Secondary | ICD-10-CM | POA: Diagnosis not present

## 2016-03-19 DIAGNOSIS — Z79899 Other long term (current) drug therapy: Secondary | ICD-10-CM

## 2016-03-19 LAB — POCT INR: INR: 2.9

## 2016-03-19 MED ORDER — WARFARIN SODIUM 5 MG PO TABS: ORAL_TABLET | ORAL | 2 refills | Status: DC

## 2016-03-19 NOTE — Telephone Encounter (Signed)
[facility]

## 2016-03-19 NOTE — Patient Instructions (Addendum)
We won't make any changes to your medications today. If you feel the swelling is bothersome, we can cut back on your amlodipine and increase the patch.  Let's see each other back in 3 months.

## 2016-03-19 NOTE — Progress Notes (Signed)
Anti-Coagulation Progress Note  Chelsea Mcdaniel is a 82 y.o. female who is currently on an anti-coagulation regimen.    RECENT RESULTS: Recent results are below, the most recent result is correlated with a dose of 45 mg. per week: Lab Results  Component Value Date   INR 2.90 03/19/2016   INR 1.9 02/23/2016   INR 2.94 (H) 02/07/2016    ANTI-COAG DOSE: Anticoagulation Dose Instructions as of 03/19/2016      [name] Tue Wed Thu Fri Sat   New Dose 5 mg 5 mg 7.5 mg 5 mg 7.5 mg 5 mg 5 mg       ANTICOAG SUMMARY: Anticoagulation Episode Summary    Current INR goal:   2.0-3.0  TTR:   61.2 % (2.2 y)  Next INR check:   04/12/2016  INR from last check:   2.90 (03/19/2016)  Weekly max dose:     Target end date:     INR check location:     Preferred lab:     Send INR reminders to:      Indications   EMBOLISM/THROMBOSIS DEEP VSL LWR EXTRM NOS (Resolved) [I82.409] Long-term (current) use of anticoagulants [Z79.01]       Comments:           ANTICOAG TODAY: Anticoagulation Summary  As of 03/19/2016   INR goal:   2.0-3.0  TTR:     Today's INR:   2.90  Next INR check:   04/12/2016  Target end date:      Indications   EMBOLISM/THROMBOSIS DEEP VSL LWR EXTRM NOS (Resolved) [I82.409] Long-term (current) use of anticoagulants [Z79.01]        Anticoagulation Episode Summary    INR check location:      Preferred lab:      Send INR reminders to:      Comments:         PATIENT INSTRUCTIONS: There are no Patient Instructions on file for this visit.   FOLLOW-UP Return in 3 weeks (on 04/12/2016) for Follow up INR at [place], III Pharm.D., CACP

## 2016-03-19 NOTE — Assessment & Plan Note (Addendum)
Assessment In the interval following her last visit with me, she was referred to home palliative care services. She established her goals of care with documentation by MOST form. Her daughter confirms this and says that they have her copy on hand.  Plan -Updated "advance care planning" section and the electronic medical chart to reflect these changes

## 2016-03-19 NOTE — Assessment & Plan Note (Signed)
Assessment Has brought with her her blood pressure log today which shows readings mostly in the 100s with occasional outliers in the 200s. She is currently taking sitagliptin 25 mg daily. She is no longer on glipizide.  Given her age and other chronic illnesses, a more liberal A1c target of 7-8% is acceptable. Most recently, she was 6.4 in June 2017.   Plan -Continue sitagliptin 25 mg daily

## 2016-03-19 NOTE — Addendum Note (Signed)
Addended by: [name] B on: 03/19/2016 02:24 PM   Modules accepted: Orders

## 2016-03-19 NOTE — Assessment & Plan Note (Signed)
Assessment She continues to complain of having inadequate movements. She thinks she is taking MiraLAX daily with her morning coffee though is unsure. She does acknowledge adequate water intake by just taking leading to me that she drinks a large volume of fluid daily. Her daughter notes that she also has suppositories sitting at home and has had to require them in the past.  Plan -Recommended to the patient that she came to have bowel movements every day and to ask for suppositories necessary

## 2016-03-19 NOTE — Patient Instructions (Signed)
Patient instructed to take medications as defined in the Anti-coagulation Track section of this encounter.  Patient instructed to take today's dose.  Patient verbalized understanding of these instructions.    

## 2016-03-19 NOTE — Assessment & Plan Note (Signed)
Assessment She has brought with her a blood pressure log which shows readings trending mostly 140s to 150s/60s to 80s on her most recent regimen: Amlodipine 10 mg, hydralazine 10 mg 3 times daily, clonidine 0.2 mg patch. With the increase in amlodipine, she has noticed worsening swelling of her legs though is not particularly distressed by it. She does not report signs and symptoms of hypertensive urgency, like difficulty breathing, chest pain, dizziness, headache, blurry vision.  Given her frailty and other chronic illnesses, I think it is reasonable to expect optimal blood pressure control of less than 150/90. Though she was mildly elevated earlier today, I'm reassured by her mostly at goal readings at home. At this point, I would prioritize what is important her which is minimizing pill burden.  Plan -Recommended we decrease amlodipine to 5 mg daily and up titrate clonidine patch from 0.2 mg to 0.4 mg though patient and her daughter politely declined as they do not feel the leg swelling to be particularly bothersome -Encouraged her to continue checking blood pressure at the same time every day

## 2016-03-19 NOTE — Telephone Encounter (Signed)
Call pt - Clonidine patch rx was refilled on 8/2; pt informed. Stated she did not call the pharmacy first. Stated if she any problems after calling the pharmacy, call me back.

## 2016-03-22 ENCOUNTER — Ambulatory Visit: Payer: Self-pay

## 2016-03-22 NOTE — Progress Notes (Signed)
CSW met with Chelsea Mcdaniel and her daughter, [name] prior to pt's scheduled appointment.  [name] had completed her HCPOA and Living Will.  CSW read through forms to confirm pt had clear understanding of directives due to pt's dx of blindness and low literacy.  [name] denied add'l questions, pt made aware Advanced Directives can be updated and changed per her request.  Forms were completed and copies provided to pt/family, original place on chart.

## 2016-03-22 NOTE — Progress Notes (Signed)
Internal Medicine Clinic Attending  Case discussed with Dr. Patel,Rushil soon after the resident saw the patient.  We reviewed the resident's history and exam and pertinent patient test results.  I agree with the assessment, diagnosis, and plan of care documented in the resident's note. 

## 2016-03-25 ENCOUNTER — Telehealth: Payer: Self-pay | Admitting: Internal Medicine

## 2016-03-25 NOTE — Telephone Encounter (Signed)
Has question for nurse

## 2016-03-25 NOTE — Telephone Encounter (Signed)
Pt 's daughter states she's going to a family reunion Friday via bus. Wants to know if it will be ok for to take the 9 hr bus ride with her feet being swollen? States unsure if pt will be able to elevate her legs during the trip. Thanks.

## 2016-03-26 NOTE — Telephone Encounter (Signed)
Are slightly different now, I was reviewing her most recent office visit with nephrology dated 03/10/16. She was restarted on metoprolol succinate 100 mg and diltiazem 60 mg 3 times daily. Given her recent hospitalization in July for bradycardia, I do not think these medications are safe, especially when given together.  I attempted to call daughter and daughter-in-law noted on demographics and was unable to reach anyone. Can you please try again this afternoon and recommend that we do not take these two medications?  I will need to route a message to Dr. Florene Glen to make sure we are on the same page.

## 2016-03-26 NOTE — Telephone Encounter (Signed)
Pt has already left for family reunion. Call and talk to pt's daughter; inform per Dr Posey Pronto " advise she get up as she can tolerate when the bus stops." States ok.

## 2016-03-26 NOTE — Telephone Encounter (Signed)
Text message sent to Dr Posey Pronto yesterday.

## 2016-03-26 NOTE — Telephone Encounter (Signed)
Talked to pt's daughter- instructed  have pt to stop taking Metoprolol 100 mg and Diltiazem 60 mg; not safe per Dr Posey Pronto. Stated she thinks pt had already stop taking one of theses but she will make sure (both meds).

## 2016-03-26 NOTE — Telephone Encounter (Signed)
Unfortunately, I did not get a text message. If she is on coumadin, that should cover her for DVT. I would advise she get up as she can tolerate when the bus stops.

## 2016-03-30 DIAGNOSIS — K5900 Constipation, unspecified: Secondary | ICD-10-CM | POA: Diagnosis not present

## 2016-04-12 ENCOUNTER — Ambulatory Visit (INDEPENDENT_AMBULATORY_CARE_PROVIDER_SITE_OTHER): Payer: Medicare Other | Admitting: Pharmacist

## 2016-04-12 DIAGNOSIS — Z8673 Personal history of transient ischemic attack (TIA), and cerebral infarction without residual deficits: Secondary | ICD-10-CM | POA: Diagnosis not present

## 2016-04-12 DIAGNOSIS — Z7901 Long term (current) use of anticoagulants: Secondary | ICD-10-CM | POA: Diagnosis not present

## 2016-04-12 DIAGNOSIS — Z86718 Personal history of other venous thrombosis and embolism: Secondary | ICD-10-CM

## 2016-04-12 LAB — POCT INR: INR: 2

## 2016-04-12 NOTE — Patient Instructions (Signed)
Patient instructed to take medications as defined in the Anti-coagulation Track section of this encounter.  Patient instructed to take today's dose.  Patient verbalized understanding of these instructions.    

## 2016-04-12 NOTE — Progress Notes (Signed)
Anti-Coagulation Progress Note  Chelsea Mcdaniel is a 82 y.o. female who is currently on an anti-coagulation regimen.    RECENT RESULTS: Recent results are below, the most recent result is correlated with a dose of 40 mg. per week: Lab Results  Component Value Date   INR 2.0 04/12/2016   INR 2.90 03/19/2016   INR 1.9 02/23/2016    ANTI-COAG DOSE: Anticoagulation Dose Instructions as of 04/12/2016      [name] Tue Wed Thu Fri Sat   New Dose 5 mg 5 mg 7.5 mg 5 mg 7.5 mg 5 mg 5 mg       ANTICOAG SUMMARY: Anticoagulation Episode Summary    Current INR goal:   2.0-3.0  TTR:   62.4 % (2.3 y)  Next INR check:   05/03/2016  INR from last check:   2.0 (04/12/2016)  Weekly max dose:     Target end date:     INR check location:     Preferred lab:     Send INR reminders to:      Indications   EMBOLISM/THROMBOSIS DEEP VSL LWR EXTRM NOS (Resolved) [I82.409] Long-term (current) use of anticoagulants [Z79.01]       Comments:           ANTICOAG TODAY: Anticoagulation Summary  As of 04/12/2016   INR goal:   2.0-3.0  TTR:     Today's INR:   2.0  Next INR check:   05/03/2016  Target end date:      Indications   EMBOLISM/THROMBOSIS DEEP VSL LWR EXTRM NOS (Resolved) [I82.409] Long-term (current) use of anticoagulants [Z79.01]        Anticoagulation Episode Summary    INR check location:      Preferred lab:      Send INR reminders to:      Comments:         PATIENT INSTRUCTIONS: There are no Patient Instructions on file for this visit.   FOLLOW-UP Return in 3 weeks (on 05/03/2016) for F/U INR check at 2:45pm.  Arrie Senate, PharmD PGY-1 Pharmacy Resident Pager: [phone] 04/12/2016

## 2016-04-15 NOTE — Progress Notes (Signed)
INTERNAL MEDICINE TEACHING ATTENDING ADDENDUM - Edia Pursifull M.D  Duration- indefinite, Indication- recurrent CVA, DVT, INR- therapeutic. Agree with pharmacy recommendations as outlined in their note.     

## 2016-05-01 ENCOUNTER — Telehealth: Payer: Self-pay | Admitting: Internal Medicine

## 2016-05-01 NOTE — Telephone Encounter (Signed)
I am reviewing paperwork today from [facility] that requires my signature:  Received 04/22/16: Authorization to discontinue amlodipine 5 mg to be taken as one tablet daily and to start amlodipine 5 mg to be taken as 2 tablets daily [total dose 10 mg]  It appears there is a typo in the instructions the home health agency read from the AVS from her visit 03/04/16: "Increased amount of amlodipine to 10 mg per day [You can take 2 of the 10 mg tablets once a day until you ran out, then the new prescription will be available to you]."  The records on our system state,"2 of the 5 mg tablets once a day until you ran out, than the new prescription will be available to you."  Generally please speak with home health RN and reconcile her antihypertensives? She should only be on these agents. -Amlodipine 10 mg daily -Clonidine 0.2 mg patch -Hydralazine 10 mg 3 times daily  Given her hospitalization back in July for symptomatic bradycardia, she should NOT be on diltiazem or metoprolol.  Thank you.

## 2016-05-03 NOTE — Telephone Encounter (Signed)
I called [facility] and they stopped seeing the pt in august, the young lady I spoke w/ stated these may have been old orders that they never got returned when sent several times, I will call hospice and pallative care

## 2016-05-10 ENCOUNTER — Ambulatory Visit (INDEPENDENT_AMBULATORY_CARE_PROVIDER_SITE_OTHER): Payer: Medicare Other | Admitting: Pharmacist

## 2016-05-10 DIAGNOSIS — Z86718 Personal history of other venous thrombosis and embolism: Secondary | ICD-10-CM

## 2016-05-10 DIAGNOSIS — Z7901 Long term (current) use of anticoagulants: Secondary | ICD-10-CM | POA: Diagnosis not present

## 2016-05-10 LAB — POCT INR: INR: 1.9

## 2016-05-10 NOTE — Patient Instructions (Signed)
Patient instructed to take medications as defined in the Anti-coagulation Track section of this encounter.  Patient instructed to take today's dose.  Patient verbalized understanding of these instructions.    

## 2016-05-10 NOTE — Progress Notes (Signed)
Anti-Coagulation Progress Note  Chelsea Mcdaniel is a 82 y.o. female who is currently on an anti-coagulation regimen.    RECENT RESULTS: Recent results are below, the most recent result is correlated with a dose of 40 mg. per week: Lab Results  Component Value Date   INR 1.9 05/10/2016   INR 2.0 04/12/2016   INR 2.90 03/19/2016    ANTI-COAG DOSE:    ANTICOAG SUMMARY: Anticoagulation Episode Summary    Current INR goal:   2.0-3.0  TTR:   60.3 % (2.3 y)  Next INR check:   05/03/2016  INR from last check:   2.0 (04/12/2016)  Most recent INR:    1.9! (05/10/2016)  Weekly max dose:     Target end date:     INR check location:     Preferred lab:     Send INR reminders to:      Indications   EMBOLISM/THROMBOSIS DEEP VSL LWR EXTRM NOS (Resolved) [I82.409] Long-term (current) use of anticoagulants [Z79.01]       Comments:           ANTICOAG TODAY:   PATIENT INSTRUCTIONS: There are no Patient Instructions on file for this visit.   FOLLOW-UP Return in about 3 weeks (around 05/31/2016) for F/U INR @ 2:15p.  Arrie Senate, PharmD PGY-1 Pharmacy Resident Pager: [phone] 05/10/2016

## 2016-05-13 ENCOUNTER — Encounter (HOSPITAL_COMMUNITY): Payer: Self-pay | Admitting: *Deleted

## 2016-05-13 DIAGNOSIS — Z7984 Long term (current) use of oral hypoglycemic drugs: Secondary | ICD-10-CM | POA: Insufficient documentation

## 2016-05-13 DIAGNOSIS — K5900 Constipation, unspecified: Secondary | ICD-10-CM | POA: Insufficient documentation

## 2016-05-13 DIAGNOSIS — E1122 Type 2 diabetes mellitus with diabetic chronic kidney disease: Secondary | ICD-10-CM | POA: Insufficient documentation

## 2016-05-13 DIAGNOSIS — Z8673 Personal history of transient ischemic attack (TIA), and cerebral infarction without residual deficits: Secondary | ICD-10-CM | POA: Diagnosis not present

## 2016-05-13 DIAGNOSIS — N183 Chronic kidney disease, stage 3 (moderate): Secondary | ICD-10-CM | POA: Diagnosis not present

## 2016-05-13 DIAGNOSIS — I129 Hypertensive chronic kidney disease with stage 1 through stage 4 chronic kidney disease, or unspecified chronic kidney disease: Secondary | ICD-10-CM | POA: Insufficient documentation

## 2016-05-13 DIAGNOSIS — Z7901 Long term (current) use of anticoagulants: Secondary | ICD-10-CM | POA: Diagnosis not present

## 2016-05-13 NOTE — ED Triage Notes (Signed)
Pt c/o constipation x 1 week. Has taken OTC laxatives without improvement. Reports feeling that stool is " too large to come out"

## 2016-05-14 ENCOUNTER — Telehealth: Payer: Self-pay

## 2016-05-14 ENCOUNTER — Emergency Department (HOSPITAL_COMMUNITY)
Admission: EM | Admit: 2016-05-14 | Discharge: 2016-05-14 | Disposition: A | Discharge status: Home / Self Care | Payer: Medicare Other | Attending: Emergency Medicine | Admitting: Emergency Medicine

## 2016-05-14 DIAGNOSIS — K5900 Constipation, unspecified: Secondary | ICD-10-CM

## 2016-05-14 NOTE — ED Provider Notes (Signed)
[facility] DEPT Provider Note   CSN: [id number] Arrival date & time: 05/13/16  2310  By signing my name below, I, Chelsea Mcdaniel, attest that this documentation has been prepared under the direction and in the presence of [name], MD. Electronically Signed: Soijett Mcdaniel, ED Scribe. 05/14/16. 1:43 AM.   History   Chief Complaint Chief Complaint  Patient presents with  . Constipation    HPI Chelsea Mcdaniel is a 82 y.o. female with a PMHx of CKD stage 3, HTN, DM, who presents to the Emergency Department complaining of constipation onset 1 week. Pt states that her last bowel movement was 1 week ago and that it feels as if her stool is too large to pass. Pt reports that she has had abdominal surgeries in the past. She notes that she has tried OTC laxatives with no relief of her symptoms. She denies vomiting, abdominal pain, CP, SOB, fever, back pain, and any other symptoms.    The history is provided by the patient. No language interpreter was used.  Constipation   This is a new problem. The current episode started more than 1 week ago. Pertinent negatives include no abdominal pain. Treatments tried: OTC laxatives. The treatment provided no relief. Her past medical history is significant for endocrine disease. Past medical history comments: CKD, stage 3.    Past Medical History:  Diagnosis Date  . Anemia     normocytic anemia with baseline hemoglobin 10-11  . Blindness of left eye     likely related to stroke, left cataract removed from that eye with complications  . Calculus gallbladder and bile duct with cholecystitis with obstruction 01/2015  . Chronic kidney disease 2006    left renal artery stenosis with probable hemodynamic significance, kidneys are normal in morphology without focal lesions or hydronephrosis this is based but cannot A. of the abdomen with and without contrast done on the 31st 2006  . CKD (chronic kidney disease), stage III   . CVA (cerebral vascular  accident) ([place]) 1990's  . CVA (cerebrovascular accident) [facility])  October 2007    CT of the head atrophy with multiple remote insults noted but no definite acute findings  . Glaucoma   . Gout   . Hyperlipidemia   . Hypertension   . Personal history of DVT (deep vein thrombosis) 08/11/2010   BLE  . Type II diabetes mellitus [facility])     Patient Active Problem List   Diagnosis Date Noted  . Advance care planning 02/18/2016  . Fatigue 10/20/2015  . Unequal blood pressure in upper extremities 10/20/2015  . Choledocholithiasis 02/11/2015  . Constipation 01/01/2015  . Cervical radiculopathy 12/10/2014  . Left knee pain 08/24/2014  . Vitamin D insufficiency 08/24/2014  . Carpal tunnel syndrome 01/07/2014  . 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 Preventative health care 08/15/2013  . CKD (chronic kidney disease) stage 3, GFR 30-59 ml/min 02/16/2012  . Osteoarthritis of hand 02/16/2012  . Long-term (current) use of anticoagulants 08/11/2010  . Type 2 diabetes mellitus ([place]) 12/12/2006  . Hyperlipidemia 12/12/2006  . Anemia of chronic disease 12/12/2006  . Hypertension associated with diabetes ([place]) 12/12/2006  . History of CVA (cerebrovascular accident) 09/19/2006    Past Surgical History:  Procedure Laterality Date  . ABDOMINAL HYSTERECTOMY    . CATARACT EXTRACTION Left   . TRANSTHORACIC ECHOCARDIOGRAM   May 2008    left ventricular systolic function normal EF estimated range of 55-60%, no definite diagnostic evidence of left ventricular  regional wall motion abnormalities, Doppler parameters consistent with abnormal left ventricular relaxation, findings suggestive of possible bicuspid aortic valve and aortic valve thickness mildly increase    OB History    No data available       Home Medications    Prior to Admission medications   Medication Sig Start Date End Date Taking? Authorizing Provider  ACCU-CHEK AVIVA PLUS test strip USE 1 TIME DAILY TO  CHECK BLOOD SUGAR 02/18/16   Riccardo Dubin, MD  ACCU-CHEK SOFTCLIX LANCETS lancets Use 1 time daily to check blood sugar. 07/02/15   Francesca Oman, DO  allopurinol (ZYLOPRIM) 100 MG tablet Take 1.5 tablets (150 mg total) by mouth daily. 02/25/16 02/24/17  Riccardo Dubin, MD  amLODipine (NORVASC) 10 MG tablet Take 1 tablet (10 mg total) by mouth daily. 03/04/16   Florinda Marker, MD  Calcium Carbonate-Vitamin D 600-400 MG-UNIT per tablet Take 1 tablet by mouth 2 (two) times daily.    Historical Provider, MD  cloNIDine (CATAPRES - DOSED IN MG/24 HR) 0.2 mg/24hr patch Place 1 patch (0.2 mg total) onto the skin every Thursday. 02/26/16   Riccardo Dubin, MD  dorzolamide-timolol (COSOPT) 22.3-6.8 MG/ML ophthalmic solution Place 1 drop into both eyes 2 (two) times daily. 01/21/16   Historical Provider, MD  hydrALAZINE (APRESOLINE) 10 MG tablet Take 1 tablet (10 mg total) by mouth 3 (three) times daily. 02/25/16   Riccardo Dubin, MD  LUMIGAN 0.01 % SOLN Place 1 drop into both eyes every morning.  12/05/13   Historical Provider, MD  polyethylene glycol powder (GLYCOLAX/MIRALAX) powder Take 17 g by mouth daily. 03/16/16   Riccardo Dubin, MD  senna-docusate (SENOKOT-S) 8.6-50 MG tablet Take 2 tablets by mouth at bedtime as needed for mild constipation. 02/25/16   Riccardo Dubin, MD  simethicone (GAS-X) 80 MG chewable tablet Chew 1 tablet (80 mg total) by mouth every 6 (six) hours as needed for flatulence. 01/08/15   Corky Sox, MD  sitaGLIPtin (JANUVIA) 25 MG tablet Take 1 tablet (25 mg total) by mouth daily. 02/23/16   Riccardo Dubin, MD  warfarin (COUMADIN) 5 MG tablet Take 1 tablet on all days of week, except on Tuesdays and Thursdays--take 1&1/2 tablets on these days. 03/19/16   Bartholomew Crews, MD    Family History Family History  Problem Relation Age of Onset  . Heart disease Mother   . Diabetes Mother   . Hyperlipidemia Mother   . Hypertension Mother   . Heart disease Father   . Diabetes Father   .  Hyperlipidemia Father   . Hypertension Father     Social History Social History  Substance Use Topics  . Smoking status: Never Smoker  . Smokeless tobacco: Never Used  . Alcohol use No     Allergies   Diltiazem and Metoprolol   Review of Systems Review of Systems  Constitutional: Negative for fever.  Respiratory: Negative for shortness of breath.   Cardiovascular: Negative for chest pain.  Gastrointestinal: Positive for constipation. Negative for abdominal pain and vomiting.  Musculoskeletal: Negative for back pain.  All other systems reviewed and are negative.    Physical Exam Updated Vital Signs BP 191/90 (BP Location: Left Arm)   Pulse 78   Temp 97.5 F (36.4 C) (Oral)   Resp 18   Ht 5' 2.5" (1.588 m)   Wt 155 lb (70.3 kg)   SpO2 100%   BMI 27.90 kg/m   Physical Exam CONSTITUTIONAL: Well  developed/well nourished HEAD: Normocephalic/atraumatic ENMT: Mucous membranes moist NECK: supple no meningeal signs CV: S1/S2 noted, no murmurs/rubs/gallops noted LUNGS: Lungs are clear to auscultation bilaterally, no apparent distress ABDOMEN: soft, nontender, no rebound or guarding, bowel sounds noted throughout abdomen RECTAL: stool impaction noted, no blood or melena noted, chaperone present NEURO: Pt is awake/alert/appropriate, moves all extremitiesx4.  No facial droop.   EXTREMITIES: pulses normal/equal, full ROM SKIN: warm, color normal PSYCH: no abnormalities of mood noted, alert and oriented to situation   ED Treatments / Results  DIAGNOSTIC STUDIES: Oxygen Saturation is 100% on RA, nl  by my interpretation.    COORDINATION OF CARE: 1:46 AM Discussed treatment plan with pt at bedside and pt agreed to plan.   Procedures Fecal disimpaction Date/Time: 05/14/2016 2:12 AM Performed by: [name] Authorized by: [name]  Consent: Verbal consent obtained. Local anesthesia used: no  Anesthesia: Local anesthesia used:  no  Sedation: Patient sedated: no Patient tolerance: Patient tolerated the procedure well with no immediate complications Comments: Patient with fecal impaction Large amount of brown stool extracted No acute complications noted Nurse at bedside for assistance    (including critical care time)  Medications Ordered in ED Medications - No data to display   Initial Impression / Assessment and Plan / ED Course  I have reviewed the triage vital signs and the nursing notes.   Clinical Course    Pt tolerated disimpaction well No acute distress She feels improved Denies abd pain Will d/c home Advised to increase fluids and continue home meds She has no signs of bowel obstruction on exam at this time  Final Clinical Impressions(s) / ED Diagnoses   Final diagnoses:  Constipation, unspecified constipation type    New Prescriptions New Prescriptions   No medications on file    I personally performed the services described in this documentation, which was scribed in my presence. The recorded information has been reviewed and is accurate.     [name], MD 05/14/16 [phone]

## 2016-05-14 NOTE — Telephone Encounter (Signed)
Chelsea Mcdaniel is a 82 y.o. female who was contacted via telephone for monitoring of Warfarin therapy.    ASSESSMENT Indication(s): CVA, unprovoked DVTs, long-term (current) use of anticoagulants Duration: indefinite  Labs:    Component Value Date/Time   AST 27 02/05/2016 1052   ALT 14 02/05/2016 1052   NA 144 02/18/2016 1538   K 4.8 02/18/2016 1538   CL 107 (H) 02/18/2016 1538   CO2 20 02/18/2016 1538   GLUCOSE 185 (H) 02/18/2016 1538   GLUCOSE 71 02/06/2016 0207   HGBA1C 6.4 01/09/2016 1401   HGBA1C 7.2 04/16/2010 1050   BUN 23 02/18/2016 1538   CREATININE 1.85 (H) 02/18/2016 1538   CREATININE 1.91 (H) 02/03/2015 1658   CALCIUM 9.0 02/18/2016 1538   CALCIUM 8.8 10/14/2014 1553   GFRNONAA 24 (L) 02/18/2016 1538   GFRNONAA 24 (L) 02/03/2015 1658   GFRAA 28 (L) 02/18/2016 1538   GFRAA 27 (L) 02/03/2015 1658   WBC 4.5 02/05/2016 1052   HGB 10.0 (L) 02/05/2016 1052   HCT 30.9 (L) 02/05/2016 1052   HCT 33.9 (L) 10/06/2015 1553   PLT 192 02/05/2016 1052   PLT 323 10/06/2015 1553    Warfarin Dose: 5mg  daily except for Tue and Thurs (7.5mg )  Safety: CMA working with patient reported that patient has not had recent bleeding/thromboembolic events. CMA reports no recent signs or symptoms of bleeding, no signs of symptoms of thromboembolism. Medication changes: no.  Renal/hepatic/drug interaction concerns: no.  Adherence: CMA reports no known adherence challenges.  Patient does correctly recite the dose. Contacted pharmacy and records indicate refills are consistent. Refill dates  02/23/16 (29 day supply), transferred from [facility] to [facility], 04/27/26 (28 day supply.  Patient Instructions: Patient advised to contact clinic or seek medical attention if signs/symptoms of bleeding or thromboembolism occur. Patient verbalized understanding by repeating back information.  Follow-up Recommended labs to consider: CBC .  Darcella Cheshire PharmD Candidate   05/14/2016, 10:17 AM

## 2016-05-23 NOTE — Progress Notes (Signed)
Indication: Recurrent deep venous thromboses.  Duration: Indefinite.  INR below target.  Agree with Dr. Marlaine Hind assessment and plan as documented.

## 2016-05-31 ENCOUNTER — Ambulatory Visit (INDEPENDENT_AMBULATORY_CARE_PROVIDER_SITE_OTHER): Payer: Medicare Other | Admitting: Pharmacist

## 2016-05-31 DIAGNOSIS — Z86718 Personal history of other venous thrombosis and embolism: Secondary | ICD-10-CM | POA: Diagnosis not present

## 2016-05-31 DIAGNOSIS — Z7901 Long term (current) use of anticoagulants: Secondary | ICD-10-CM

## 2016-05-31 LAB — POCT INR: INR: 1.7

## 2016-05-31 NOTE — Patient Instructions (Signed)
Patient instructed to take medications as defined in the Anti-coagulation Track section of this encounter.  Patient instructed to take today's dose.  Patient instructed to take 1 tablet on Mondays, Wednesdays and Fridays; On Sundays, Tuesdays, Thursdays and Saturdays--take 1&1/2 tablets.  Patient verbalized understanding of these instructions.

## 2016-05-31 NOTE — Progress Notes (Signed)
Anti-Coagulation Progress Note  Chelsea Mcdaniel is a 82 y.o. female who is currently on an anti-coagulation regimen.    RECENT RESULTS: Recent results are below, the most recent result is correlated with a dose of 40 mg. per week: Lab Results  Component Value Date   INR 1.70 05/31/2016   INR 1.9 05/10/2016   INR 2.0 04/12/2016    ANTI-COAG DOSE: Anticoagulation Dose Instructions as of 05/31/2016      [name] Tue Wed Thu Fri Sat   New Dose 7.5 mg 5 mg 7.5 mg 5 mg 7.5 mg 5 mg 7.5 mg       ANTICOAG SUMMARY: Anticoagulation Episode Summary    Current INR goal:   2.0-3.0  TTR:   58.9 % (2.4 y)  Next INR check:   06/21/2016  INR from last check:   1.70! (05/31/2016)  Weekly max dose:     Target end date:     INR check location:     Preferred lab:     Send INR reminders to:      Indications   EMBOLISM/THROMBOSIS DEEP VSL LWR EXTRM NOS (Resolved) [I82.409] Long-term (current) use of anticoagulants [Z79.01]       Comments:           ANTICOAG TODAY: Anticoagulation Summary  As of 05/31/2016   INR goal:   2.0-3.0  TTR:     Today's INR:   1.70!  Next INR check:   06/21/2016  Target end date:      Indications   EMBOLISM/THROMBOSIS DEEP VSL LWR EXTRM NOS (Resolved) [I82.409] Long-term (current) use of anticoagulants [Z79.01]        Anticoagulation Episode Summary    INR check location:      Preferred lab:      Send INR reminders to:      Comments:         PATIENT INSTRUCTIONS: Patient instructed to take medications as defined in the Anti-coagulation Track section of this encounter.  Patient instructed to take today's dose.  Patient instructed to take 1 tablet on Mondays, Wednesdays and Fridays; On Sundays, Tuesdays, Thursdays and Saturdays--take 1&1/2 tablets.  Patient verbalized understanding of these instructions.     FOLLOW-UP Return in about 3 weeks (around 06/21/2016) for Follow up INR at 2:30PM.  [name], III Pharm.D., CACP

## 2016-06-01 NOTE — Progress Notes (Signed)
INTERNAL MEDICINE TEACHING ATTENDING ADDENDUM - Lalla Brothers M.D  Duration- indefinite, Indication- recurrent VTE, INR- subtherapeutic. Agree with pharmacy recommendations as outlined in their note.

## 2016-06-03 DIAGNOSIS — I10 Essential (primary) hypertension: Secondary | ICD-10-CM | POA: Diagnosis not present

## 2016-06-03 DIAGNOSIS — N183 Chronic kidney disease, stage 3 (moderate): Secondary | ICD-10-CM | POA: Diagnosis not present

## 2016-06-03 DIAGNOSIS — N184 Chronic kidney disease, stage 4 (severe): Secondary | ICD-10-CM | POA: Diagnosis not present

## 2016-06-03 DIAGNOSIS — R809 Proteinuria, unspecified: Secondary | ICD-10-CM | POA: Diagnosis not present

## 2016-06-03 DIAGNOSIS — R600 Localized edema: Secondary | ICD-10-CM | POA: Diagnosis not present

## 2016-06-09 DIAGNOSIS — H401113 Primary open-angle glaucoma, right eye, severe stage: Secondary | ICD-10-CM | POA: Diagnosis not present

## 2016-06-14 ENCOUNTER — Telehealth: Payer: Self-pay

## 2016-06-14 NOTE — Telephone Encounter (Signed)
Patient was contacted by Frank Tillman, PharmD candidate. I agree with the assessment and plan of care documented.  

## 2016-06-21 ENCOUNTER — Other Ambulatory Visit: Payer: Self-pay | Admitting: Internal Medicine

## 2016-06-21 ENCOUNTER — Ambulatory Visit (INDEPENDENT_AMBULATORY_CARE_PROVIDER_SITE_OTHER): Payer: Medicare Other | Admitting: Pharmacist

## 2016-06-21 DIAGNOSIS — Z7901 Long term (current) use of anticoagulants: Secondary | ICD-10-CM | POA: Diagnosis not present

## 2016-06-21 DIAGNOSIS — Z86718 Personal history of other venous thrombosis and embolism: Secondary | ICD-10-CM

## 2016-06-21 LAB — POCT INR: INR: 2.3

## 2016-06-21 NOTE — Progress Notes (Signed)
INTERNAL MEDICINE TEACHING ATTENDING ADDENDUM - Chelsea Groves, DO Duration- indefinate, Indication- recurrent VTE, INR-  Lab Results  Component Value Date   INR 2.30 06/21/2016  . Agree with pharmacy recommendations as outlined in their note.

## 2016-06-21 NOTE — Progress Notes (Signed)
Anti-Coagulation Progress Note  Chelsea Mcdaniel is a 82 y.o. female who is currently on an anti-coagulation regimen.    RECENT RESULTS: Recent results are below, the most recent result is correlated with a dose of 45 mg. per week: Lab Results  Component Value Date   INR 2.30 06/21/2016   INR 1.70 05/31/2016   INR 1.9 05/10/2016    ANTI-COAG DOSE: Anticoagulation Dose Instructions as of 06/21/2016      [name] Tue Wed Thu Fri Sat   New Dose 7.5 mg 5 mg 7.5 mg 5 mg 7.5 mg 5 mg 7.5 mg       ANTICOAG SUMMARY: Anticoagulation Episode Summary    Current INR goal:   2.0-3.0  TTR:   58.7 % (2.4 y)  Next INR check:   07/12/2016  INR from last check:   2.30 (06/21/2016)  Weekly max dose:     Target end date:     INR check location:     Preferred lab:     Send INR reminders to:      Indications   EMBOLISM/THROMBOSIS DEEP VSL LWR EXTRM NOS (Resolved) [I82.409] Long-term (current) use of anticoagulants [Z79.01]       Comments:           ANTICOAG TODAY: Anticoagulation Summary  As of 06/21/2016   INR goal:   2.0-3.0  TTR:     Today's INR:   2.30  Next INR check:   07/12/2016  Target end date:      Indications   EMBOLISM/THROMBOSIS DEEP VSL LWR EXTRM NOS (Resolved) [I82.409] Long-term (current) use of anticoagulants [Z79.01]        Anticoagulation Episode Summary    INR check location:      Preferred lab:      Send INR reminders to:      Comments:         PATIENT INSTRUCTIONS: Patient instructed to take medications as defined in the Anti-coagulation Track section of this encounter.  Patient instructed to take today's dose.  Patient instructed to take 1 tablet of her 5mg  peach colored warfarin tablets on Mondays, Wednesdays and Fridays. All other days--take 1&1/2 of your 5mg  peach colored warfarin tablets. Patient verbalized understanding of these instructions.     FOLLOW-UP Return in 3 weeks (on 07/12/2016) for Follow up INR at 2:45PM.  [name],  III Pharm.D., CACP

## 2016-06-21 NOTE — Patient Instructions (Signed)
Patient instructed to take medications as defined in the Anti-coagulation Track section of this encounter.  Patient instructed to take today's dose.  Patient instructed to take 1 tablet of her 5mg  peach colored warfarin tablets on Mondays, Wednesdays and Fridays. All other days--take 1&1/2 of your 5mg  peach colored warfarin tablets. Patient verbalized understanding of these instructions.

## 2016-06-23 DIAGNOSIS — R531 Weakness: Secondary | ICD-10-CM | POA: Diagnosis not present

## 2016-06-25 ENCOUNTER — Other Ambulatory Visit: Payer: Self-pay | Admitting: Internal Medicine

## 2016-06-25 DIAGNOSIS — Z7901 Long term (current) use of anticoagulants: Secondary | ICD-10-CM

## 2016-06-25 NOTE — Telephone Encounter (Signed)
As this patient was seen by me and deemed necessary for their treatment, I will refill this prescription for warfarin. I confirmed the prescription with her most recent anticoagulation office visit.

## 2016-07-07 DIAGNOSIS — R809 Proteinuria, unspecified: Secondary | ICD-10-CM | POA: Diagnosis not present

## 2016-07-07 DIAGNOSIS — I10 Essential (primary) hypertension: Secondary | ICD-10-CM | POA: Diagnosis not present

## 2016-07-07 DIAGNOSIS — N184 Chronic kidney disease, stage 4 (severe): Secondary | ICD-10-CM | POA: Diagnosis not present

## 2016-07-07 DIAGNOSIS — N2581 Secondary hyperparathyroidism of renal origin: Secondary | ICD-10-CM | POA: Diagnosis not present

## 2016-07-07 DIAGNOSIS — R600 Localized edema: Secondary | ICD-10-CM | POA: Diagnosis not present

## 2016-07-12 ENCOUNTER — Ambulatory Visit (INDEPENDENT_AMBULATORY_CARE_PROVIDER_SITE_OTHER): Payer: Medicare Other | Admitting: Pharmacist

## 2016-07-12 DIAGNOSIS — Z7901 Long term (current) use of anticoagulants: Secondary | ICD-10-CM

## 2016-07-12 DIAGNOSIS — Z86718 Personal history of other venous thrombosis and embolism: Secondary | ICD-10-CM

## 2016-07-12 LAB — POCT INR: INR: 3

## 2016-07-12 NOTE — Progress Notes (Signed)
Anti-Coagulation Progress Note  Chelsea Mcdaniel is a 82 y.o. female who is currently on an anti-coagulation regimen.    RECENT RESULTS: Recent results are below, the most recent result is correlated with a dose of 45 mg. per week: Lab Results  Component Value Date   INR 3.0 07/12/2016   INR 2.30 06/21/2016   INR 1.70 05/31/2016    ANTI-COAG DOSE: Anticoagulation Dose Instructions as of 07/12/2016      [name] Tue Wed Thu Fri Sat   New Dose 7.5 mg 5 mg 7.5 mg 5 mg 7.5 mg 5 mg 5 mg       ANTICOAG SUMMARY: Anticoagulation Episode Summary    Current INR goal:   2.0-3.0  TTR:   59.6 % (2.5 y)  Next INR check:   08/02/2016  INR from last check:   3.0 (07/12/2016)  Weekly max dose:     Target end date:     INR check location:     Preferred lab:     Send INR reminders to:      Indications   EMBOLISM/THROMBOSIS DEEP VSL LWR EXTRM NOS (Resolved) [I82.409] Long-term (current) use of anticoagulants [Z79.01]       Comments:           ANTICOAG TODAY: Anticoagulation Summary  As of 07/12/2016   INR goal:   2.0-3.0  TTR:     Today's INR:   3.0  Next INR check:   08/02/2016  Target end date:      Indications   EMBOLISM/THROMBOSIS DEEP VSL LWR EXTRM NOS (Resolved) [I82.409] Long-term (current) use of anticoagulants [Z79.01]        Anticoagulation Episode Summary    INR check location:      Preferred lab:      Send INR reminders to:      Comments:         PATIENT INSTRUCTIONS: There are no Patient Instructions on file for this visit.   FOLLOW-UP Return in about 5 weeks (around 08/16/2016) for INR F/U.  Arrie Senate, PharmD PGY-1 Pharmacy Resident Pager: [phone] 07/12/2016

## 2016-07-12 NOTE — Progress Notes (Signed)
INTERNAL MEDICINE TEACHING ATTENDING ADDENDUM - Aldine Contes M.D  Duration- indefinite, Indication- DVT, recurrent CVAs, INR- therapeutic. Agree with pharmacy recommendations as outlined in their note.

## 2016-07-12 NOTE — Patient Instructions (Signed)
Patient instructed to take medications as defined in the Anti-coagulation Track section of this encounter.  Patient instructed to take today's dose.  Patient verbalized understanding of these instructions.    

## 2016-07-16 ENCOUNTER — Other Ambulatory Visit: Payer: Self-pay | Admitting: Internal Medicine

## 2016-07-16 DIAGNOSIS — K5900 Constipation, unspecified: Secondary | ICD-10-CM

## 2016-08-05 ENCOUNTER — Other Ambulatory Visit: Payer: Self-pay | Admitting: Internal Medicine

## 2016-08-05 DIAGNOSIS — E1159 Type 2 diabetes mellitus with other circulatory complications: Secondary | ICD-10-CM

## 2016-08-05 DIAGNOSIS — I152 Hypertension secondary to endocrine disorders: Secondary | ICD-10-CM

## 2016-08-05 DIAGNOSIS — I10 Essential (primary) hypertension: Principal | ICD-10-CM

## 2016-08-06 NOTE — Telephone Encounter (Signed)
As this patient was seen by me and deemed necessary for their treatment, I will refill this prescription for amlodipine.

## 2016-08-16 ENCOUNTER — Ambulatory Visit (INDEPENDENT_AMBULATORY_CARE_PROVIDER_SITE_OTHER): Payer: Medicare Other | Admitting: Pharmacist

## 2016-08-16 DIAGNOSIS — Z86718 Personal history of other venous thrombosis and embolism: Secondary | ICD-10-CM | POA: Diagnosis not present

## 2016-08-16 DIAGNOSIS — Z8673 Personal history of transient ischemic attack (TIA), and cerebral infarction without residual deficits: Secondary | ICD-10-CM

## 2016-08-16 DIAGNOSIS — Z7901 Long term (current) use of anticoagulants: Secondary | ICD-10-CM | POA: Diagnosis not present

## 2016-08-16 LAB — POCT INR: INR: 2.4

## 2016-08-16 NOTE — Progress Notes (Signed)
Anti-Coagulation Progress Note  Chelsea Mcdaniel is a 83 y.o. female who is currently on an anti-coagulation regimen.    RECENT RESULTS: Recent results are below, the most recent result is correlated with a dose of 42.5 mg. per week: Lab Results  Component Value Date   INR 2.4 08/16/2016   INR 3.0 07/12/2016   INR 2.30 06/21/2016    ANTI-COAG DOSE: Anticoagulation Dose Instructions as of 08/16/2016      Chelsea Mcdaniel Tue Wed Thu Fri Sat   New Dose 7.5 mg 5 mg 7.5 mg 5 mg 7.5 mg 5 mg 5 mg       ANTICOAG SUMMARY: Anticoagulation Episode Summary    Current INR goal:   2.0-3.0  TTR:   61.1 % (2.6 y)  Next INR check:   09/06/2016  INR from last check:     Weekly max dose:     Target end date:     INR check location:     Preferred lab:     Send INR reminders to:      Indications   EMBOLISM/THROMBOSIS DEEP VSL LWR EXTRM NOS (Resolved) [I82.409] Long-term (current) use of anticoagulants [Z79.01]       Comments:           ANTICOAG TODAY: Anticoagulation Summary  As of 08/16/2016   INR goal:   2.0-3.0  TTR:     Today's INR:     Next INR check:   09/06/2016  Target end date:      Indications   EMBOLISM/THROMBOSIS DEEP VSL LWR EXTRM NOS (Resolved) [I82.409] Long-term (current) use of anticoagulants [Z79.01]        Anticoagulation Episode Summary    INR check location:      Preferred lab:      Send INR reminders to:      Comments:         PATIENT INSTRUCTIONS: Patient Instructions  Patient instructed to take medications as defined in the Anti-coagulation Track section of this encounter.  Patient instructed to take today's dose.  Patient verbalized understanding of these instructions.      FOLLOW-UP Return in about 3 weeks (around 09/06/2016) for Follow Up INR on 2/12 at 2:15PM.  Demetrius Charity, PharmD Acute Care Pharmacy Resident  Pager: [phone] 08/16/2016

## 2016-08-16 NOTE — Patient Instructions (Signed)
Patient instructed to take medications as defined in the Anti-coagulation Track section of this encounter.  Patient instructed to take today's dose.  Patient verbalized understanding of these instructions.    

## 2016-08-16 NOTE — Progress Notes (Signed)
INTERNAL MEDICINE TEACHING ATTENDING ADDENDUM - Aldine Contes M.D  Duration- indefinite, Indication- DVT, recurrent CVA, INR- therapeutic. Agree with pharmacy recommendations as outlined in their note.

## 2016-08-21 ENCOUNTER — Other Ambulatory Visit: Payer: Self-pay | Admitting: Internal Medicine

## 2016-08-21 DIAGNOSIS — E1159 Type 2 diabetes mellitus with other circulatory complications: Secondary | ICD-10-CM

## 2016-08-21 DIAGNOSIS — I10 Essential (primary) hypertension: Principal | ICD-10-CM

## 2016-08-23 NOTE — Telephone Encounter (Signed)
As this patient was seen by me and deemed necessary for their treatment, I will refill this prescription for sitagliptan.

## 2016-08-25 ENCOUNTER — Emergency Department (HOSPITAL_COMMUNITY)
Admission: EM | Admit: 2016-08-25 | Discharge: 2016-08-25 | Disposition: A | Discharge status: Home / Self Care | Payer: Medicare Other | Attending: Physician Assistant | Admitting: Physician Assistant

## 2016-08-25 ENCOUNTER — Encounter (HOSPITAL_COMMUNITY): Payer: Self-pay

## 2016-08-25 DIAGNOSIS — E1122 Type 2 diabetes mellitus with diabetic chronic kidney disease: Secondary | ICD-10-CM | POA: Insufficient documentation

## 2016-08-25 DIAGNOSIS — R030 Elevated blood-pressure reading, without diagnosis of hypertension: Secondary | ICD-10-CM | POA: Diagnosis not present

## 2016-08-25 DIAGNOSIS — I129 Hypertensive chronic kidney disease with stage 1 through stage 4 chronic kidney disease, or unspecified chronic kidney disease: Secondary | ICD-10-CM | POA: Diagnosis not present

## 2016-08-25 DIAGNOSIS — K5900 Constipation, unspecified: Secondary | ICD-10-CM | POA: Diagnosis not present

## 2016-08-25 DIAGNOSIS — N183 Chronic kidney disease, stage 3 (moderate): Secondary | ICD-10-CM | POA: Diagnosis not present

## 2016-08-25 DIAGNOSIS — Z7901 Long term (current) use of anticoagulants: Secondary | ICD-10-CM | POA: Diagnosis not present

## 2016-08-25 DIAGNOSIS — Z8673 Personal history of transient ischemic attack (TIA), and cerebral infarction without residual deficits: Secondary | ICD-10-CM | POA: Insufficient documentation

## 2016-08-25 DIAGNOSIS — Z79899 Other long term (current) drug therapy: Secondary | ICD-10-CM | POA: Diagnosis not present

## 2016-08-25 DIAGNOSIS — K6289 Other specified diseases of anus and rectum: Secondary | ICD-10-CM | POA: Diagnosis not present

## 2016-08-25 MED ORDER — POLYETHYLENE GLYCOL 3350 17 G PO PACK: 17.0000 g | PACK | Freq: Two times a day (BID) | ORAL | 0 refills | Status: DC

## 2016-08-25 NOTE — ED Provider Notes (Signed)
[place] DEPT Provider Note   CSN: [id number] Arrival date & time: 08/25/16  1544     History   Chief Complaint Chief Complaint  Patient presents with  . Rectal Pain    HPI Chelsea Mcdaniel is a 83 y.o. female.  HPI   Patient is a 83 year old female presenting with rectal pain and constipation. Patient called her daughter who went and got mag citrate and glycerin tablets for her earlier today. Her daughter arrived back with these prescriptions the patient had already called EMS.  Patient arrived with paperwork that says DO NOT RESUSCITATE and also limited intervention such as no CT, limited medications.  Level 5 caveat: dementia.   Past Medical History:  Diagnosis Date  . Anemia     normocytic anemia with baseline hemoglobin 10-11  . Blindness of left eye     likely related to stroke, left cataract removed from that eye with complications  . Calculus gallbladder and bile duct with cholecystitis with obstruction 01/2015  . Chronic kidney disease 2006    left renal artery stenosis with probable hemodynamic significance, kidneys are normal in morphology without focal lesions or hydronephrosis this is based but cannot A. of the abdomen with and without contrast done on the 31st 2006  . CKD (chronic kidney disease), stage III   . CVA (cerebral vascular accident) ([place]) 1990's  . CVA (cerebrovascular accident) [facility])  October 2007    CT of the head atrophy with multiple remote insults noted but no definite acute findings  . Glaucoma   . Gout   . Hyperlipidemia   . Hypertension   . Personal history of DVT (deep vein thrombosis) 08/11/2010   BLE  . Type II diabetes mellitus [facility])     Patient Active Problem List   Diagnosis Date Noted  . Advance care planning 02/18/2016  . Fatigue 10/20/2015  . Unequal blood pressure in upper extremities 10/20/2015  . Choledocholithiasis 02/11/2015  . Constipation 01/01/2015  . Cervical radiculopathy 12/10/2014  . Left knee pain  08/24/2014  . Vitamin D insufficiency 08/24/2014  . Carpal tunnel syndrome 01/07/2014  . 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 Preventative health care 08/15/2013  . CKD (chronic kidney disease) stage 3, GFR 30-59 ml/min 02/16/2012  . Osteoarthritis of hand 02/16/2012  . Long-term (current) use of anticoagulants 08/11/2010  . Type 2 diabetes mellitus ([place]) 12/12/2006  . Hyperlipidemia 12/12/2006  . Anemia of chronic disease 12/12/2006  . Hypertension associated with diabetes ([place]) 12/12/2006  . History of CVA (cerebrovascular accident) 09/19/2006    Past Surgical History:  Procedure Laterality Date  . ABDOMINAL HYSTERECTOMY    . CATARACT EXTRACTION Left   . TRANSTHORACIC ECHOCARDIOGRAM   May 2008    left ventricular systolic function normal EF estimated range of 55-60%, no definite diagnostic evidence of left ventricular regional wall motion abnormalities, Doppler parameters consistent with abnormal left ventricular relaxation, findings suggestive of possible bicuspid aortic valve and aortic valve thickness mildly increase    OB History    No data available       Home Medications    Prior to Admission medications   Medication Sig Start Date End Date Taking? Authorizing Provider  allopurinol (ZYLOPRIM) 100 MG tablet Take 1.5 tablets (150 mg total) by mouth daily. 02/25/16 02/24/17 Yes Rushil Sherrye Payor, MD  amLODipine (NORVASC) 10 MG tablet TAKE 1 TABLET BY MOUTH EVERY DAY 08/06/16  Yes Rushil Sherrye Payor, MD  Calcium Carbonate-Vitamin D 600-400 MG-UNIT  per tablet Take 1 tablet by mouth 2 (two) times daily.   Yes Historical Provider, MD  cloNIDine (CATAPRES - DOSED IN MG/24 HR) 0.2 mg/24hr patch Place 1 patch (0.2 mg total) onto the skin every Thursday. 02/26/16  Yes Rushil Sherrye Payor, MD  dorzolamide-timolol (COSOPT) 22.3-6.8 MG/ML ophthalmic solution Place 1 drop into both eyes 2 (two) times daily. 01/21/16  Yes Historical Provider, MD  hydrALAZINE (APRESOLINE) 10 MG  tablet Take 1 tablet (10 mg total) by mouth 3 (three) times daily. 02/25/16  Yes Rushil Sherrye Payor, MD  JANUVIA 25 MG tablet TAKE 1 TABLET BY MOUTH EVERY DAY 08/23/16  Yes Rushil Sherrye Payor, MD  LUMIGAN 0.01 % SOLN Place 1 drop into both eyes every morning.  12/05/13  Yes Historical Provider, MD  simethicone (GAS-X) 80 MG chewable tablet Chew 1 tablet (80 mg total) by mouth every 6 (six) hours as needed for flatulence. 01/08/15  Yes Corky Sox, MD  warfarin (COUMADIN) 5 MG tablet Take 1 tablet every day, except on Tuesdays and Thursdays--take 1.5 tablets these days. 06/25/16  Yes Rushil Sherrye Payor, MD  ACCU-CHEK AVIVA PLUS test strip USE 1 TIME DAILY TO CHECK BLOOD SUGAR 02/18/16   Riccardo Dubin, MD  ACCU-CHEK SOFTCLIX LANCETS lancets Use 1 time daily to check blood sugar. 07/02/15   Francesca Oman, DO  GNP STOOL SOFTENER/LAXATIVE 8.6-50 MG tablet TAKE 2 TABLETS BY MOUTH AT BEDTIME AS NEEDED FOR CONSTIPATION Patient not taking: Reported on 08/25/2016 07/16/16   Sid Falcon, MD  polyethylene glycol [facility]) packet Take 17 g by mouth 2 (two) times daily. 08/25/16   Arleen Bar Lyn Paloma Grange, MD  polyethylene glycol powder (GLYCOLAX/MIRALAX) powder Take 17 g by mouth daily. Patient not taking: Reported on 08/25/2016 03/16/16   Riccardo Dubin, MD    Family History Family History  Problem Relation Age of Onset  . Heart disease Mother   . Diabetes Mother   . Hyperlipidemia Mother   . Hypertension Mother   . Heart disease Father   . Diabetes Father   . Hyperlipidemia Father   . Hypertension Father     Social History Social History  Substance Use Topics  . Smoking status: Never Smoker  . Smokeless tobacco: Never Used  . Alcohol use No     Allergies   Diltiazem and Metoprolol   Review of Systems Review of Systems  Unable to perform ROS: Dementia     Physical Exam Updated Vital Signs BP 164/73 (BP Location: Right Arm)   Pulse 85   Temp 97.8 F (36.6 C) (Oral)   Resp 18   SpO2 98%   Physical  Exam  Constitutional: She appears well-developed and well-nourished.  HENT:  Head: Normocephalic and atraumatic.  Eyes: Right eye exhibits no discharge.  Cardiovascular: Normal rate and regular rhythm.   Pulmonary/Chest: Effort normal and breath sounds normal.  Genitourinary:  Genitourinary Comments: Rectum has extensive hemmorhoids, rectum is significantly dialted, with solid stool present.   Neurological:  oreinted to self only   Skin: Skin is warm and dry. She is not diaphoretic.  Psychiatric: She has a normal mood and affect.  Nursing note and vitals reviewed.    ED Treatments / Results  Labs (all labs ordered are listed, but only abnormal results are displayed) Labs Reviewed - No data to display  EKG  EKG Interpretation None       Radiology No results found.  Procedures Fecal disimpaction Date/Time: 08/25/2016 4:40 PM Performed by: Zenovia Jarred  LYN Authorized by: Zenovia Jarred LYN  Consent: Verbal consent obtained. Written consent not obtained. Consent given by: patient Patient identity confirmed: verbally with patient Local anesthesia used: no  Anesthesia: Local anesthesia used: no  Sedation: Patient sedated: no Patient tolerance: Patient tolerated the procedure well with no immediate complications    (including critical care time)  Medications Ordered in ED Medications - No data to display   Initial Impression / Assessment and Plan / ED Course  I have reviewed the triage vital signs and the nursing notes.  Pertinent labs & imaging results that were available during my care of the patient were reviewed by me and considered in my medical decision making (see chart for details).     Patient is a 83 year old female presented with constipation and rectal pain. Patient has paperwork significantly limiting the intervention that she wants. On physical exam her rectum is extremely dilated with extensive hemorrhoids and a stool ball  present.  Patient mannually disimpacted.  Bedside commode provided for further stooling.  Pt already had mag citrate today. Discussed that patient might want to follow up with a GI physician for further care, and we offered CT given the stool burdon and degree of dilatation of her rectum. however daughter states patient would like to limit interventions.   Will encourage better stool sofetning at home- Will give miralax.   Final Clinical Impressions(s) / ED Diagnoses   Final diagnoses:  Rectal pain    New Prescriptions Discharge Medication List as of 08/25/2016  7:09 PM    START taking these medications   Details  polyethylene glycol (MIRALAX) packet Take 17 g by mouth 2 (two) times daily., Starting Wed 08/25/2016, Print         Jyren Cerasoli Julio Alm, MD 08/25/16 [id number]

## 2016-08-25 NOTE — Discharge Instructions (Signed)
You were here with constipation. We disimpacted. Please return if you have any increase in pain or any concerns. You've elected not to have a CAT scan today. However if he went further treatment please return immediately to the emergency department. Please follow-up with her primary care physician.

## 2016-08-25 NOTE — ED Triage Notes (Signed)
Patient is from home for rectal pain with associated constipation. Patient unsure when last bowel movement was. CBG was 391.  Vitals stable. A&Ox4

## 2016-08-25 NOTE — ED Notes (Signed)
Patient verbalized understanding of discharge instructions and denies any further needs or questions at this time. VS stable. Patient ambulatory with steady gait and use of cane. Assisted to ED entrance in wheelchair.

## 2016-09-06 ENCOUNTER — Ambulatory Visit (INDEPENDENT_AMBULATORY_CARE_PROVIDER_SITE_OTHER): Payer: Medicare Other | Admitting: Pharmacist

## 2016-09-06 DIAGNOSIS — Z7901 Long term (current) use of anticoagulants: Secondary | ICD-10-CM | POA: Diagnosis not present

## 2016-09-06 DIAGNOSIS — Z86718 Personal history of other venous thrombosis and embolism: Secondary | ICD-10-CM | POA: Diagnosis not present

## 2016-09-06 LAB — POCT INR: INR: 2.3

## 2016-09-07 NOTE — Patient Instructions (Signed)
Patient instructed to take medications as defined in the Anti-coagulation Track section of this encounter.  Patient instructed to take today's dose.  Patient instructed to take 1&1/2 tablets on Sundays, Tuesdays and Thursdays; all other days--take only 1 tablet.  Patient verbalized understanding of these instructions.

## 2016-09-07 NOTE — Progress Notes (Signed)
Anti-Coagulation Progress Note  Chelsea Mcdaniel is a 83 y.o. female who is currently on an anti-coagulation regimen.    RECENT RESULTS: Recent results are below, the most recent result is correlated with a dose of 42.5 mg. per week: Lab Results  Component Value Date   INR 2.30 09/06/2016   INR 2.4 08/16/2016   INR 3.0 07/12/2016    ANTI-COAG DOSE: Anticoagulation Dose Instructions as of 09/06/2016      Sun Mon Tue Wed Thu Fri Sat   New Dose 7.5 mg 5 mg 7.5 mg 5 mg 7.5 mg 5 mg 5 mg    Description   Take 1& 1/2 tablets on Sundays, Tuesdays and Thursdays; all other days--take only 1 tablet.       ANTICOAG SUMMARY: Anticoagulation Episode Summary    Current INR goal:   2.0-3.0  TTR:   61.9 % (2.7 y)  Next INR check:   09/27/2016  INR from last check:   2.30 (09/06/2016)  Weekly max dose:     Target end date:     INR check location:     Preferred lab:     Send INR reminders to:      Indications   EMBOLISM/THROMBOSIS DEEP VSL LWR EXTRM NOS (Resolved) [I82.409] Long-term (current) use of anticoagulants [Z79.01]       Comments:           ANTICOAG TODAY: Anticoagulation Summary  As of 09/06/2016   INR goal:   2.0-3.0  TTR:     Today's INR:   2.30  Next INR check:   09/27/2016  Target end date:      Indications   EMBOLISM/THROMBOSIS DEEP VSL LWR EXTRM NOS (Resolved) [I82.409] Long-term (current) use of anticoagulants [Z79.01]        Anticoagulation Episode Summary    INR check location:      Preferred lab:      Send INR reminders to:      Comments:         PATIENT INSTRUCTIONS: Patient Instructions  Patient instructed to take medications as defined in the Anti-coagulation Track section of this encounter.  Patient instructed to take today's dose.  Patient instructed to take 1&1/2 tablets on Sundays, Tuesdays and Thursdays; all other days--take only 1 tablet.  Patient verbalized understanding of these instructions.       FOLLOW-UP Return in 3 weeks (on  09/27/2016) for Follow up INR at [place], III Pharm.D., CACP

## 2016-09-13 ENCOUNTER — Ambulatory Visit: Payer: Self-pay

## 2016-09-15 ENCOUNTER — Other Ambulatory Visit: Payer: Self-pay | Admitting: Internal Medicine

## 2016-09-15 DIAGNOSIS — Z7901 Long term (current) use of anticoagulants: Secondary | ICD-10-CM

## 2016-09-16 NOTE — Telephone Encounter (Signed)
As this patient was seen in the anticoagulation clinic and deemed necessary for their treatment, I will refill this prescription for warfarin. I updated the instructions to reflect her last visit.

## 2016-09-27 ENCOUNTER — Ambulatory Visit (INDEPENDENT_AMBULATORY_CARE_PROVIDER_SITE_OTHER): Payer: Medicare Other | Admitting: Pharmacist

## 2016-09-27 DIAGNOSIS — Z86718 Personal history of other venous thrombosis and embolism: Secondary | ICD-10-CM

## 2016-09-27 DIAGNOSIS — Z7901 Long term (current) use of anticoagulants: Secondary | ICD-10-CM

## 2016-09-27 LAB — POCT INR: INR: 4.8

## 2016-09-27 NOTE — Progress Notes (Signed)
Anti-Coagulation Progress Note  Chelsea Mcdaniel is a 83 y.o. female who is currently on an anti-coagulation regimen.    RECENT RESULTS: Recent results are below, the most recent result is correlated with a dose of 42.5 mg. per week: Lab Results  Component Value Date   INR 4.80 09/27/2016   INR 2.30 09/06/2016   INR 2.4 08/16/2016    ANTI-COAG DOSE: Anticoagulation Dose Instructions as of 09/27/2016      Sun Mon Tue Wed Thu Fri Sat   New Dose 7.5 mg 5 mg 5 mg 5 mg 7.5 mg 5 mg 5 mg    Description   OMIT dose today (Monday, September 27, 2016). Resume taking warfarin tomorrow on Tuesday, September 28, 2016. Take 1 tablet on Tuesday, Wednesday, Friday and Saturday;  On Thursday and Sunday--take 1 & 1/2 tablets.       ANTICOAG SUMMARY: Anticoagulation Episode Summary    Current INR goal:   2.0-3.0  TTR:   61.2 % (2.7 y)  Next INR check:   10/04/2016  INR from last check:   4.80! (09/27/2016)  Weekly max dose:     Target end date:     INR check location:     Preferred lab:     Send INR reminders to:      Indications   EMBOLISM/THROMBOSIS DEEP VSL LWR EXTRM NOS (Resolved) [I82.409] Long-term (current) use of anticoagulants [Z79.01]       Comments:           ANTICOAG TODAY: Anticoagulation Summary  As of 09/27/2016   INR goal:   2.0-3.0  TTR:     Today's INR:   4.80!  Next INR check:   10/04/2016  Target end date:      Indications   EMBOLISM/THROMBOSIS DEEP VSL LWR EXTRM NOS (Resolved) [I82.409] Long-term (current) use of anticoagulants [Z79.01]        Anticoagulation Episode Summary    INR check location:      Preferred lab:      Send INR reminders to:      Comments:         PATIENT INSTRUCTIONS: Patient Instructions  Patient instructed to take medications as defined in the Anti-coagulation Track section of this encounter.  Patient instructed to  OMIT todays dose. On Tuesday MARCH 6--recommence taking warfarin. Take 1 tablet on Tuesday, Wednesday, Friday and Saturday.  On Thursday and Sunday, take 1 & 1/2 tablets.  Patient verbalized understanding of these instructions.       FOLLOW-UP Return in 7 days (on 10/04/2016) for Follow up INR at 3:15PM.  [name], III Pharm.D., CACP

## 2016-09-27 NOTE — Patient Instructions (Signed)
Patient instructed to take medications as defined in the Anti-coagulation Track section of this encounter.  Patient instructed to  OMIT todays dose. On Tuesday MARCH 6--recommence taking warfarin. Take 1 tablet on Tuesday, Wednesday, Friday and Saturday. On Thursday and Sunday, take 1 & 1/2 tablets.  Patient verbalized understanding of these instructions.

## 2016-09-29 ENCOUNTER — Other Ambulatory Visit: Payer: Self-pay

## 2016-09-29 DIAGNOSIS — N183 Chronic kidney disease, stage 3 unspecified: Secondary | ICD-10-CM

## 2016-09-29 DIAGNOSIS — E1122 Type 2 diabetes mellitus with diabetic chronic kidney disease: Secondary | ICD-10-CM

## 2016-09-29 NOTE — Telephone Encounter (Signed)
[facility] to be filled.

## 2016-09-30 MED ORDER — ACCU-CHEK AVIVA PLUS W/DEVICE KIT: PACK | 1 refills | Status: DC

## 2016-09-30 NOTE — Telephone Encounter (Signed)
Thank you Butch Penny. I approve of the prescription and will send it over.

## 2016-09-30 NOTE — Progress Notes (Signed)
INTERNAL MEDICINE TEACHING ATTENDING ADDENDUM - Clatie Kessen M.D  Duration- indefinite, Indication- recurrent CVAs, DVT, INR- supratherapeutic. Agree with pharmacy recommendations as outlined in their note.

## 2016-10-01 ENCOUNTER — Other Ambulatory Visit: Payer: Self-pay | Admitting: Internal Medicine

## 2016-10-01 DIAGNOSIS — N183 Chronic kidney disease, stage 3 (moderate): Principal | ICD-10-CM

## 2016-10-01 DIAGNOSIS — E1122 Type 2 diabetes mellitus with diabetic chronic kidney disease: Secondary | ICD-10-CM

## 2016-10-04 ENCOUNTER — Ambulatory Visit: Payer: Self-pay

## 2016-10-04 NOTE — Telephone Encounter (Signed)
I was under the impression they had filled this glucometer already. Do you know why they are sending this script again?

## 2016-10-04 NOTE — Telephone Encounter (Signed)
Call made to pharmacy-refill request sent in error, no further action needed at this time.  Phone call complete.Despina Hidden Cassady3/12/201810:49 AM

## 2016-10-06 DIAGNOSIS — R600 Localized edema: Secondary | ICD-10-CM | POA: Diagnosis not present

## 2016-10-06 DIAGNOSIS — R809 Proteinuria, unspecified: Secondary | ICD-10-CM | POA: Diagnosis not present

## 2016-10-06 DIAGNOSIS — I10 Essential (primary) hypertension: Secondary | ICD-10-CM | POA: Diagnosis not present

## 2016-10-06 DIAGNOSIS — N184 Chronic kidney disease, stage 4 (severe): Secondary | ICD-10-CM | POA: Diagnosis not present

## 2016-10-06 DIAGNOSIS — N2581 Secondary hyperparathyroidism of renal origin: Secondary | ICD-10-CM | POA: Diagnosis not present

## 2016-10-11 ENCOUNTER — Ambulatory Visit (INDEPENDENT_AMBULATORY_CARE_PROVIDER_SITE_OTHER): Payer: Medicare Other | Admitting: Pharmacist

## 2016-10-11 DIAGNOSIS — Z86718 Personal history of other venous thrombosis and embolism: Secondary | ICD-10-CM | POA: Diagnosis not present

## 2016-10-11 DIAGNOSIS — Z7901 Long term (current) use of anticoagulants: Secondary | ICD-10-CM | POA: Diagnosis not present

## 2016-10-11 LAB — POCT INR: INR: 2.8

## 2016-10-11 NOTE — Patient Instructions (Signed)
Patient instructed to take medications as defined in the Anti-coagulation Track section of this encounter.  Patient instructed to take today's dose.  Patient instructed to take 1 tablet of your 5mg  peach-colored warfarin tablets by mouth once-daily--EXCEPT on SUNDAYS--take 1 & 1/2 tablets on SUNDAYS.  Patient verbalized understanding of these instructions.

## 2016-10-11 NOTE — Progress Notes (Signed)
Anti-Coagulation Progress Note  Chelsea Mcdaniel is a 83 y.o. female who is currently on an anti-coagulation regimen.    RECENT RESULTS: Recent results are below, the most recent result is correlated with a dose of 40 mg. per week: Lab Results  Component Value Date   INR 2.80 10/11/2016   INR 4.80 09/27/2016   INR 2.30 09/06/2016    ANTI-COAG DOSE: Anticoagulation Dose Instructions as of 10/11/2016      Sun Mon Tue Wed Thu Fri Sat   New Dose 7.5 mg 5 mg 5 mg 5 mg 7.5 mg 5 mg 5 mg    Description   Take 1 tablet by mouth once-daily at [facility] on all days of the week--EXCEPT on SUNDAYS--on Sundays, take 1 & 1/2 tablets of your 5mg  peach-colored warfarin tablets.       ANTICOAG SUMMARY: Anticoagulation Episode Summary    Current INR goal:   2.0-3.0  TTR:   60.5 % (2.8 y)  Next INR check:   11/01/2016  INR from last check:   2.80 (10/11/2016)  Weekly max dose:     Target end date:     INR check location:     Preferred lab:     Send INR reminders to:      Indications   EMBOLISM/THROMBOSIS DEEP VSL LWR EXTRM NOS (Resolved) [I82.409] Long-term (current) use of anticoagulants [Z79.01]       Comments:           ANTICOAG TODAY: Anticoagulation Summary  As of 10/11/2016   INR goal:   2.0-3.0  TTR:     Today's INR:   2.80  Next INR check:   11/01/2016  Target end date:      Indications   EMBOLISM/THROMBOSIS DEEP VSL LWR EXTRM NOS (Resolved) [I82.409] Long-term (current) use of anticoagulants [Z79.01]        Anticoagulation Episode Summary    INR check location:      Preferred lab:      Send INR reminders to:      Comments:         PATIENT INSTRUCTIONS: Patient Instructions  Patient instructed to take medications as defined in the Anti-coagulation Track section of this encounter.  Patient instructed to take today's dose.  Patient instructed to take 1 tablet of your 5mg  peach-colored warfarin tablets by mouth once-daily--EXCEPT on SUNDAYS--take 1 & 1/2 tablets on  SUNDAYS.  Patient verbalized understanding of these instructions.       FOLLOW-UP Return in about 3 weeks (around 11/01/2016) for Follow up INR at 3:45PM.  [name], III Pharm.D., CACP

## 2016-10-13 DIAGNOSIS — H401113 Primary open-angle glaucoma, right eye, severe stage: Secondary | ICD-10-CM | POA: Diagnosis not present

## 2016-10-13 DIAGNOSIS — H2511 Age-related nuclear cataract, right eye: Secondary | ICD-10-CM | POA: Diagnosis not present

## 2016-10-13 DIAGNOSIS — H338 Other retinal detachments: Secondary | ICD-10-CM | POA: Diagnosis not present

## 2016-10-13 DIAGNOSIS — H2702 Aphakia, left eye: Secondary | ICD-10-CM | POA: Diagnosis not present

## 2016-11-01 ENCOUNTER — Encounter (INDEPENDENT_AMBULATORY_CARE_PROVIDER_SITE_OTHER): Payer: Self-pay

## 2016-11-01 ENCOUNTER — Other Ambulatory Visit: Payer: Self-pay | Admitting: Internal Medicine

## 2016-11-01 ENCOUNTER — Ambulatory Visit (INDEPENDENT_AMBULATORY_CARE_PROVIDER_SITE_OTHER): Payer: Medicare Other | Admitting: Pharmacist

## 2016-11-01 DIAGNOSIS — Z7901 Long term (current) use of anticoagulants: Secondary | ICD-10-CM | POA: Diagnosis not present

## 2016-11-01 DIAGNOSIS — Z86718 Personal history of other venous thrombosis and embolism: Secondary | ICD-10-CM | POA: Diagnosis not present

## 2016-11-01 DIAGNOSIS — E118 Type 2 diabetes mellitus with unspecified complications: Secondary | ICD-10-CM

## 2016-11-01 LAB — POCT INR: INR: 2.1

## 2016-11-01 NOTE — Progress Notes (Signed)
Anti-Coagulation Progress Note  Chelsea Mcdaniel is a 83 y.o. female who is currently on an anti-coagulation regimen.    RECENT RESULTS: Recent results are below, the most recent result is correlated with a dose of 37.5 mg. per week: Lab Results  Component Value Date   INR 2.10 11/01/2016   INR 2.80 10/11/2016   INR 4.80 09/27/2016    ANTI-COAG DOSE: Anticoagulation Dose Instructions as of 11/01/2016      [name] Tue Wed Thu Fri Sat   New Dose 7.5 mg 5 mg 5 mg 5 mg 7.5 mg 5 mg 5 mg    Description   Take 1 tablet by mouth once-daily at [facility] on all days of the week--EXCEPT on SUNDAYS--on Sundays, take 1 & 1/2 tablets of your 5mg  peach-colored warfarin tablets.       ANTICOAG SUMMARY: Anticoagulation Episode Summary    Current INR goal:   2.0-3.0  TTR:   61.3 % (2.8 y)  Next INR check:   11/29/2016  INR from last check:   2.10 (11/01/2016)  Weekly max dose:     Target end date:     INR check location:     Preferred lab:     Send INR reminders to:      Indications   EMBOLISM/THROMBOSIS DEEP VSL LWR EXTRM NOS (Resolved) [I82.409] Long-term (current) use of anticoagulants [Z79.01]       Comments:           ANTICOAG TODAY: Anticoagulation Summary  As of 11/01/2016   INR goal:   2.0-3.0  TTR:     Today's INR:   2.10  Next INR check:   11/29/2016  Target end date:      Indications   EMBOLISM/THROMBOSIS DEEP VSL LWR EXTRM NOS (Resolved) [I82.409] Long-term (current) use of anticoagulants [Z79.01]        Anticoagulation Episode Summary    INR check location:      Preferred lab:      Send INR reminders to:      Comments:         PATIENT INSTRUCTIONS: Patient Instructions  Patient instructed to take medications as defined in the Anti-coagulation Track section of this encounter.  Patient instructed to take today's dose.  Patient instructed to take 1 tablet by mouth once-daily at [facility] on all days of the week--EXCEPT on SUNDAYS--on Sundays, take 1 & 1/2 tablets of your 5mg   peach-colored warfarin tablets. Patient verbalized understanding of these instructions.       FOLLOW-UP Return in 4 weeks (on 11/29/2016) for Follow up INR at [place], III Pharm.D., CACP

## 2016-11-01 NOTE — Progress Notes (Signed)
Indication: Recurrent unprovoked deep venous thromboses. Duration: Indefinite. INR: At target. Agree with Dr. Gladstone Pih assessment and plan.

## 2016-11-01 NOTE — Patient Instructions (Signed)
Patient instructed to take medications as defined in the Anti-coagulation Track section of this encounter.  Patient instructed to take today's dose.  Patient instructed to take 1 tablet by mouth once-daily at [facility] on all days of the week--EXCEPT on SUNDAYS--on Sundays, take 1 & 1/2 tablets of your 5mg  peach-colored warfarin tablets. Patient verbalized understanding of these instructions.

## 2016-11-01 NOTE — Telephone Encounter (Signed)
As this patient was seen by me and deemed necessary for their treatment, I will refill this prescription for lancets.

## 2016-11-29 ENCOUNTER — Ambulatory Visit (INDEPENDENT_AMBULATORY_CARE_PROVIDER_SITE_OTHER): Payer: Medicare Other | Admitting: Pharmacist

## 2016-11-29 DIAGNOSIS — Z86718 Personal history of other venous thrombosis and embolism: Secondary | ICD-10-CM

## 2016-11-29 DIAGNOSIS — Z7901 Long term (current) use of anticoagulants: Secondary | ICD-10-CM

## 2016-11-29 LAB — POCT INR: INR: 2.2

## 2016-11-29 MED ORDER — WARFARIN SODIUM 5 MG PO TABS: ORAL_TABLET | ORAL | 3 refills | Status: DC

## 2016-11-29 NOTE — Patient Instructions (Signed)
Patient instructed to take medications as defined in the Anti-coagulation Track section of this encounter.  Patient instructed to take today's dose.  Patient instructed to take 1 & 1/2 tablets of your 5mg  peach-colored warfarin tablets on Tuesdays, Thursdays and Sundays; on all other days--Mondays, Wednesdays, Fridays and Saturdays--take only ONE (1) tablet of your 5mg  peach-colored warfarin tablets.  Patient verbalized understanding of these instructions.

## 2016-11-29 NOTE — Progress Notes (Signed)
Anti-Coagulation Progress Note  Chelsea Mcdaniel is a 83 y.o. female who is currently on an anti-coagulation regimen for the indications of embolism/thrombosis, deep VSL lower extremity NOS (Resolved) [182.409], long-term (current) use of anticoagulants [Z79.01]:    RECENT RESULTS: Recent results are below, the most recent result is correlated with a dose of 37.5 mg. per week: Lab Results  Component Value Date   INR 2.20 11/29/2016   INR 2.10 11/01/2016   INR 2.80 10/11/2016    ANTI-COAG DOSE: Anticoagulation Dose Instructions as of 11/29/2016      [name] Tue Wed Thu Fri Sat   New Dose 7.5 mg 5 mg 5 mg 5 mg 7.5 mg 5 mg 5 mg    Description   Take 1 tablet by mouth once-daily at [facility] on all days of the week--EXCEPT on Sundays, Tuesdays and Thursdays---take 1 & 1/2 tablets on these days.        ANTICOAG SUMMARY: Anticoagulation Episode Summary    Current INR goal:   2.0-3.0  TTR:   62.3 % (2.9 y)  Next INR check:   12/27/2016  INR from last check:   2.20 (11/29/2016)  Weekly max dose:     Target end date:     INR check location:     Preferred lab:     Send INR reminders to:      Indications   EMBOLISM/THROMBOSIS DEEP VSL LWR EXTRM NOS (Resolved) [I82.409] Long-term (current) use of anticoagulants [Z79.01]       Comments:           ANTICOAG TODAY: Anticoagulation Summary  As of 11/29/2016   INR goal:   2.0-3.0  TTR:     Today's INR:   2.20  Next INR check:   12/27/2016  Target end date:      Indications   EMBOLISM/THROMBOSIS DEEP VSL LWR EXTRM NOS (Resolved) [I82.409] Long-term (current) use of anticoagulants [Z79.01]        Anticoagulation Episode Summary    INR check location:      Preferred lab:      Send INR reminders to:      Comments:         PATIENT INSTRUCTIONS: Patient Instructions  Patient instructed to take medications as defined in the Anti-coagulation Track section of this encounter.  Patient instructed to take today's dose.  Patient instructed  to take 1 & 1/2 tablets of your 5mg  peach-colored warfarin tablets on Tuesdays, Thursdays and Sundays; on all other days--Mondays, Wednesdays, Fridays and Saturdays--take only ONE (1) tablet of your 5mg  peach-colored warfarin tablets.  Patient verbalized understanding of these instructions.       FOLLOW-UP Return in 4 weeks (on 12/27/2016) for Follow up INR at 4:15PM.  [name], III Pharm.D., CACP

## 2016-11-29 NOTE — Progress Notes (Signed)
Indication: Recurrent venous thrombosis. Duration: Indefinite. INR: At target. Agree with Dr. Gladstone Pih assessment and plan.

## 2016-12-17 ENCOUNTER — Encounter: Payer: Self-pay | Admitting: Internal Medicine

## 2016-12-17 ENCOUNTER — Ambulatory Visit (INDEPENDENT_AMBULATORY_CARE_PROVIDER_SITE_OTHER): Payer: Medicare Other | Admitting: Internal Medicine

## 2016-12-17 VITALS — BP 130/58 | HR 63 | Temp 98.2°F | Ht 62.0 in | Wt 141.1 lb

## 2016-12-17 DIAGNOSIS — D692 Other nonthrombocytopenic purpura: Secondary | ICD-10-CM | POA: Diagnosis not present

## 2016-12-17 DIAGNOSIS — E1122 Type 2 diabetes mellitus with diabetic chronic kidney disease: Secondary | ICD-10-CM

## 2016-12-17 DIAGNOSIS — I10 Essential (primary) hypertension: Secondary | ICD-10-CM

## 2016-12-17 DIAGNOSIS — D631 Anemia in chronic kidney disease: Secondary | ICD-10-CM

## 2016-12-17 DIAGNOSIS — Z7984 Long term (current) use of oral hypoglycemic drugs: Secondary | ICD-10-CM | POA: Diagnosis not present

## 2016-12-17 DIAGNOSIS — Z79899 Other long term (current) drug therapy: Secondary | ICD-10-CM | POA: Diagnosis not present

## 2016-12-17 DIAGNOSIS — I129 Hypertensive chronic kidney disease with stage 1 through stage 4 chronic kidney disease, or unspecified chronic kidney disease: Secondary | ICD-10-CM

## 2016-12-17 DIAGNOSIS — N184 Chronic kidney disease, stage 4 (severe): Principal | ICD-10-CM

## 2016-12-17 DIAGNOSIS — N189 Chronic kidney disease, unspecified: Secondary | ICD-10-CM | POA: Diagnosis not present

## 2016-12-17 DIAGNOSIS — E1159 Type 2 diabetes mellitus with other circulatory complications: Secondary | ICD-10-CM

## 2016-12-17 LAB — GLUCOSE, CAPILLARY: Glucose-Capillary: 139 mg/dL — ABNORMAL HIGH (ref 65–99)

## 2016-12-17 LAB — POCT GLYCOSYLATED HEMOGLOBIN (HGB A1C): HEMOGLOBIN A1C: 7.2

## 2016-12-17 NOTE — Progress Notes (Signed)
   CC: right arm bruise  HPI:  [name] is a 83 y.o. who presents today with her daughter for right arm bruise. Please see assessment & plan for status of chronic medical problems.   Past Medical History:  Diagnosis Date  . Anemia     normocytic anemia with baseline hemoglobin 10-11  . Blindness of left eye     likely related to stroke, left cataract removed from that eye with complications  . Calculus gallbladder and bile duct with cholecystitis with obstruction 01/2015  . Chronic kidney disease 2006    left renal artery stenosis with probable hemodynamic significance, kidneys are normal in morphology without focal lesions or hydronephrosis this is based but cannot A. of the abdomen with and without contrast done on the 31st 2006  . CKD (chronic kidney disease), stage III   . CVA (cerebral vascular accident) ([place]) 1990's  . CVA (cerebrovascular accident) [facility])  October 2007    CT of the head atrophy with multiple remote insults noted but no definite acute findings  . Glaucoma   . Gout   . Hyperlipidemia   . Hypertension   . Personal history of DVT (deep vein thrombosis) 08/11/2010   BLE  . Type II diabetes mellitus ([place])     Review of Systems:  Please see each problem below for a pertinent review of systems.  Physical Exam:  Vitals:   12/17/16 1608  BP: (!) 130/58  Pulse: 63  Temp: 98.2 F (36.8 C)  TempSrc: Oral  SpO2: 99%  Weight: 141 lb 1.6 oz (64 kg)  Height: 5\' 2"  (1.575 m)   Physical Exam  Constitutional: No distress.  HENT:  Head: Normocephalic and atraumatic.  Eyes: Conjunctivae are normal. No scleral icterus.  Cardiovascular: Normal rate and normal heart sounds.   Pulmonary/Chest: Effort normal. No respiratory distress.  Musculoskeletal: She exhibits edema (Bilateral lower extremity edema).  Neurological: She is alert.  Skin: She is not diaphoretic.  Right arm purplish bruises, largest measuring 2-3 cm. Non-erythematous and non-scaly.     Assessment & Plan:   See Encounters Tab for problem based charting.  Patient discussed with Dr. Evette Doffing

## 2016-12-17 NOTE — Patient Instructions (Signed)
We will put in the order for therapy and shoes. Keep up the good work with the diabetes and blood pressure.   Let's see each other back in 6-12 months.

## 2016-12-18 DIAGNOSIS — D692 Other nonthrombocytopenic purpura: Secondary | ICD-10-CM | POA: Insufficient documentation

## 2016-12-18 DIAGNOSIS — Z2882 Immunization not carried out because of caregiver refusal: Secondary | ICD-10-CM | POA: Insufficient documentation

## 2016-12-18 NOTE — Assessment & Plan Note (Signed)
Assessment Review of her home BP log shows BP trending 110s-140s/60s-80s. Her home medications include amlodipine 10 mg daily, clonidine 0.2 mg patch, hydralazine 10 mg three times daily, furosemide 60 mg daily [per her nephrologist for peripheral edema].Review of her most recent lab work from her nephrologist shows BUN 17, Crt 2.5 which are at baseline. She denies any chest pain, difficulty breathing, and dizziness.   While her BP today is well controlled, it is reasonable to keep a more liberal goal of 150/90 given her age and other co-morbidities.  Plan -Continue amlodipine 10 mg daily, clonidine 0.2 mg patch, hydralazine 10 mg three times daily, furosemide 60 mg daily -Discarded additional medications today that she is no longer taking in our clinic

## 2016-12-18 NOTE — Assessment & Plan Note (Addendum)
Assessment Review of her multiple blood sugar logs trend 160s-190s. She is only on sitagliptin 25 mg once daily and is not interested in insulin therapy. She has an appointment with her ophthalmologist next month and hopes to sort out her eye drops.  She is interested in diabetic shoes and an exercise program.   Given overall functional status and other co-morbidities, glycemic target of A1c 8 is appropriate. Her A1c today is 7.2 which is falsely low given her chronic anemia in the setting of renal insufficiency.  Plan -Order physical therapy and diabetic shoes -Perform foot exam today -Follow-up in 6-12 months for reassess blood sugar log. If she does not have them, then we can consider rechecking A1c to reassess glycemic control. As she is not interested in additional medications, lifestyle modification would be our only recommendation should her glycemic control deteriorate. -Offered pneumococcal vaccination though she declined as she is not interested in any injections.

## 2016-12-18 NOTE — Assessment & Plan Note (Signed)
Assessment For the last month, she has noticed bruising overlying her right arm that is not painful or itchy. She denies any recent injuries as well. She is on warfarin for unprovoked DVTs in the past, and given her age, senile purpura is a likely consideration.  Plan -Reassured her that it is related to her oral anticoagulant but to let us know if she notes any changes to the lesion in the way of features, size, distribution, or feeling

## 2016-12-21 NOTE — Progress Notes (Signed)
Internal Medicine Clinic Attending  Case discussed with Dr. Patel,Rushil at the time of the visit.  We reviewed the resident's history and exam and pertinent patient test results.  I agree with the assessment, diagnosis, and plan of care documented in the resident's note.  

## 2016-12-27 ENCOUNTER — Encounter: Payer: Self-pay | Admitting: *Deleted

## 2016-12-27 ENCOUNTER — Ambulatory Visit (INDEPENDENT_AMBULATORY_CARE_PROVIDER_SITE_OTHER): Payer: Medicare Other | Admitting: Pharmacist

## 2016-12-27 DIAGNOSIS — Z8673 Personal history of transient ischemic attack (TIA), and cerebral infarction without residual deficits: Secondary | ICD-10-CM

## 2016-12-27 DIAGNOSIS — Z86718 Personal history of other venous thrombosis and embolism: Secondary | ICD-10-CM

## 2016-12-27 DIAGNOSIS — Z7901 Long term (current) use of anticoagulants: Secondary | ICD-10-CM

## 2016-12-27 LAB — POCT INR: INR: 2.8

## 2016-12-27 NOTE — Patient Instructions (Signed)
Patient instructed to take medications as defined in the Anti-coagulation Track section of this encounter.  Patient instructed to take today's dose.  Patient instructed to take ONE (1) tablet daily by mouth at [facility] of her 5mg  peach-colored warfarin tablets all days of week EXCEPT on SUNDAYS. On SUNDAYS--take 1 & 1/2 tablets of your 5mg  peach-colored warfarin tablets. Patient verbalized understanding of these instructions.

## 2016-12-27 NOTE — Progress Notes (Signed)
Anti-Coagulation Progress Note  Chelsea Mcdaniel is a 83 y.o. female who is currently on an anti-coagulation regimen.    RECENT RESULTS: Recent results are below, the most recent result is correlated with a dose of 40 mg. per week: Lab Results  Component Value Date   INR 2.80 12/27/2016   INR 2.20 11/29/2016   INR 2.10 11/01/2016    ANTI-COAG DOSE: Anticoagulation Warfarin Dose Instructions as of 12/27/2016      [name] Tue Wed Thu Fri Sat   New Dose 7.5 mg 5 mg 5 mg 5 mg 5 mg 5 mg 5 mg    Description   Take 1 tablet by mouth once-daily at [facility] on all days of the week--EXCEPT on Sundays, take 1 & 1/2 tablets on SUNDAYS.       ANTICOAG SUMMARY: Anticoagulation Episode Summary    Current INR goal:   2.0-3.0  TTR:   63.3 % (3 y)  Next INR check:   01/17/2017  INR from last check:   2.80 (12/27/2016)  Weekly max warfarin dose:     Target end date:     INR check location:     Preferred lab:     Send INR reminders to:      Indications   EMBOLISM/THROMBOSIS DEEP VSL LWR EXTRM NOS (Resolved) [I82.409] Long-term (current) use of anticoagulants [Z79.01]       Comments:           ANTICOAG TODAY: Anticoagulation Summary  As of 12/27/2016   INR goal:   2.0-3.0  TTR:     Today's INR:   2.80  Next INR check:   01/17/2017  Target end date:      Indications   EMBOLISM/THROMBOSIS DEEP VSL LWR EXTRM NOS (Resolved) [I82.409] Long-term (current) use of anticoagulants [Z79.01]        Anticoagulation Episode Summary    INR check location:      Preferred lab:      Send INR reminders to:      Comments:         PATIENT INSTRUCTIONS: Patient Instructions  Patient instructed to take medications as defined in the Anti-coagulation Track section of this encounter.  Patient instructed to take today's dose.  Patient instructed to take ONE (1) tablet daily by mouth at [facility] of her 5mg  peach-colored warfarin tablets all days of week EXCEPT on SUNDAYS. On SUNDAYS--take 1 & 1/2 tablets of  your 5mg  peach-colored warfarin tablets. Patient verbalized understanding of these instructions.       FOLLOW-UP Return in 3 weeks (on 01/17/2017) for Follow up INR at 4:15PM.  [name], III Pharm.D., CACP

## 2016-12-28 NOTE — Progress Notes (Signed)
INTERNAL MEDICINE TEACHING ATTENDING ADDENDUM - Gervis Gaba M.D  Duration- indefinite, Indication- recurrent CVA, DVT, INR- therapeutic. Agree with pharmacy recommendations as outlined in their note.     

## 2017-01-07 ENCOUNTER — Ambulatory Visit: Payer: Medicare Other | Attending: Internal Medicine | Admitting: Physical Therapy

## 2017-01-07 DIAGNOSIS — M6281 Muscle weakness (generalized): Secondary | ICD-10-CM | POA: Insufficient documentation

## 2017-01-07 DIAGNOSIS — R2689 Other abnormalities of gait and mobility: Secondary | ICD-10-CM | POA: Insufficient documentation

## 2017-01-07 DIAGNOSIS — R2681 Unsteadiness on feet: Secondary | ICD-10-CM | POA: Diagnosis not present

## 2017-01-07 NOTE — Therapy (Signed)
[facility] [address] [place], [place], [address] Phone: [phone]   Fax:  [phone]  Physical Therapy Evaluation  Patient Details  Name: Chelsea Mcdaniel MRN: [id number] Date of Birth: 1927/11/27 Referring Provider: Charlott Rakes, MD  Encounter Date: 01/07/2017      PT End of Session - 01/07/17 1317    Visit Number 1   Number of Visits 9   Date for PT Re-Evaluation 03/08/17   Authorization Type UHC Medicare/Medicaid secondary-GCODE   PT Start Time 1107   PT Stop Time 1147   PT Time Calculation (min) 40 min   Equipment Utilized During Treatment Gait belt   Activity Tolerance Patient tolerated treatment well   Behavior During Therapy [facility] for tasks assessed/performed      Past Medical History:  Diagnosis Date  . Anemia     normocytic anemia with baseline hemoglobin 10-11  . Blindness of left eye     likely related to stroke, left cataract removed from that eye with complications  . Calculus gallbladder and bile duct with cholecystitis with obstruction 01/2015  . Chronic kidney disease 2006    left renal artery stenosis with probable hemodynamic significance, kidneys are normal in morphology without focal lesions or hydronephrosis this is based but cannot A. of the abdomen with and without contrast done on the 31st 2006  . CKD (chronic kidney disease), stage III   . CVA (cerebral vascular accident) ([place]) 1990's  . CVA (cerebrovascular accident) [facility])  October 2007    CT of the head atrophy with multiple remote insults noted but no definite acute findings  . Glaucoma   . Gout   . Hyperlipidemia   . Hypertension   . Personal history of DVT (deep vein thrombosis) 08/11/2010   BLE  . Type II diabetes mellitus ([place])     Past Surgical History:  Procedure Laterality Date  . ABDOMINAL HYSTERECTOMY    . CATARACT EXTRACTION Left   . TRANSTHORACIC ECHOCARDIOGRAM   May 2008    left ventricular systolic function  normal EF estimated range of 55-60%, no definite diagnostic evidence of left ventricular regional wall motion abnormalities, Doppler parameters consistent with abnormal left ventricular relaxation, findings suggestive of possible bicuspid aortic valve and aortic valve thickness mildly increase    There were no vitals filed for this visit.       Subjective Assessment - 01/07/17 1113    Subjective Pt reports having problems with balance and walking.  She was hospitalized in January 2018.  Pt reports no falls in the past 6 months.  Pt has a walker at home, which she sometimes uses.  Pt reports being fearful of falling.   Pertinent History DM, chronic kidney disease   Patient Stated Goals Pt's goal for therapy is to get steadier on my feet.   Currently in Pain? No/denies            [facility] PT Assessment - 01/07/17 0001      Assessment   Medical Diagnosis gait instability   Referring Provider Charlott Rakes, MD   Onset Date/Surgical Date 12/18/16  Referral     Precautions   Precautions Fall     Balance Screen   Has the patient fallen in the past 6 months No   Has the patient had a decrease in activity level because of a fear of falling?  Yes   Is the patient reluctant to leave their home because of a fear of falling?  Yes  Home Environment   Living Environment Private residence   Living Arrangements Alone   Available Help at Discharge [place] comes daily, meal prep, bathing   Type of [place]  One bedroom apartment in home   Home Access Level entry   [place] - single point;Walker - 2 wheels     Prior Function   Level of Independence Needs assistance with ADLs;Independent with household mobility with device  Has aide to assist with bathing, dressing   Leisure Used to walk out to [employer], but doesn't do that anymore; goes to church     Observation/Other Assessments   Focus on Therapeutic Outcomes (FOTO)  Functional  Status Intake 40; ABC score 23.8%     ROM / Strength   AROM / PROM / Strength Strength     Strength   Overall Strength Deficits   Strength Assessment Site Hip;Knee;Ankle   Right/Left Hip Right;Left   Right Hip Flexion 3+/5   Left Hip Flexion 4/5   Right/Left Knee Right;Left   Right Knee Flexion 4/5   Right Knee Extension 4/5   Left Knee Flexion 4/5   Left Knee Extension 4/5   Right/Left Ankle Right;Left   Right Ankle Dorsiflexion 3+/5   Left Ankle Dorsiflexion 3-/5     Transfers   Transfers Sit to Stand;Stand to Sit   Sit to Stand 6: Modified independent (Device/Increase time);With upper extremity assist;From chair/3-in-1;Multiple attempts   Stand to Sit 6: Modified independent (Device/Increase time);With upper extremity assist;To chair/3-in-1     Ambulation/Gait   Ambulation/Gait Yes   Ambulation/Gait Assistance 5: Supervision;4: Min guard   Ambulation Distance (Feet) 115 Feet   Assistive device Straight cane  no tip on cane   Gait Pattern Step-through pattern;Decreased stride length;Wide base of support   Ambulation Surface Level;Indoor   Gait velocity 31 sec = 1.06 ft/sec     Standardized Balance Assessment   Standardized Balance Assessment Timed Up and Go Test;Berg Balance Test     Berg Balance Test   Sit to Stand Able to stand  independently using hands   Standing Unsupported Able to stand 2 minutes with supervision   Sitting with Back Unsupported but Feet Supported on Floor or Stool Able to sit safely and securely 2 minutes   Stand to Sit Controls descent by using hands   Transfers Able to transfer with verbal cueing and /or supervision   Standing Unsupported with Eyes Closed Able to stand 10 seconds with supervision   Standing Ubsupported with Feet Together Needs help to attain position but able to stand for 30 seconds with feet together   From Standing, Reach Forward with Outstretched Arm Can reach forward >12 cm safely (5")   From Standing Position, Pick up  Object from Floor Able to pick up shoe, needs supervision   From Standing Position, Turn to Look Behind Over each Shoulder Looks behind from both sides and weight shifts well   Turn 360 Degrees Needs close supervision or verbal cueing   Standing Unsupported, Alternately Place Feet on Step/Stool Needs assistance to keep from falling or unable to try   Standing Unsupported, One Foot in [employer] balance while stepping or standing   Standing on One Leg Unable to try or needs assist to prevent fall   Total Score 30   Berg comment: Scores <45/56 are indicative of increased fall risk     Timed Up and Go Test   Normal TUG (seconds) 30.25  TUG Comments Scores >14.5 seconds indicate increased fall risk; >30 seconds indicates difficulty with ADLs in home            Objective measurements completed on examination: See above findings.                  PT Education - 01/07/17 1316    Education provided Yes   Education Details Objective findings, POC and goals to address balance; discussed fall risk and need to use RW (based on Berg score of 30/56); discussed need for tip on cane if using cane in home (due to decreased safety of wooden cane and no tip)   Person(s) Educated Patient   Methods Explanation   Comprehension Verbalized understanding          PT Short Term Goals - 01/07/17 1325      PT SHORT TERM GOAL #1   Title STGs= LTGs (4 weeks)           PT Long Term Goals - 01/07/17 1325      PT LONG TERM GOAL #1   Title Pt will perform HEP with family/aide supervision for improved balance and gait.  TARGET 02/06/17   Baseline No current HEP   Time 4   Period Weeks   Status New     PT LONG TERM GOAL #2   Title Pt will improve TUG score to less than or equal to 25 seconds for decreased fall risk.   Baseline TUG 30.25 sec   Time 4   Status New     PT LONG TERM GOAL #3   Title Pt will improve Berg Balance score to at least 35/56 for decreased fall risk.    Baseline Berg 30/56   Time 4   Period Weeks   Status New     PT LONG TERM GOAL #4   Title Pt will improve gait velocity to at least 1.3 ft/sec for improved gait efficiency and safety in the home.    Baseline gait velocity 1.06 ft/sec   Time 4   Period Weeks   Status New     PT LONG TERM GOAL #5   Title Pt will verbalize understanding of fall prevention in the home environment.   Time 4   Period Weeks   Status New     Additional Long Term Goals   Additional Long Term Goals Yes     PT LONG TERM GOAL #6   Title Pt will report improvement in ABC score by at least 20% for improved confidence in balance.   Baseline ABC score 23.8% at eval   Time 4   Period Weeks                Plan - 01/07/17 1320    Clinical Impression Statement Pt is an 83 year old female who presents to OP PT with balance and gait difficulty.  Pt has not had falls in past 6 months, but does report increase fear of falling limits mobility.  Pt presents with decreased strength, decreased balance, decreased gait independence and safety.  Pt is at fall risk per Merrilee Jansky, TUG,a nd gait velocity scores. Pt no longer walks to mailbox due to fear of falling.  She would benefit from skilled physical therapy to address the above stated deficits to decrease fall risk and improve functional mobility.    History and Personal Factors relevant to plan of care: >co-morbidities, lives alone   Clinical Presentation Stable   Clinical Presentation due to: fall  risk with 3 objective measures, no hx of falls   Clinical Decision Making Low   Rehab Potential Good   PT Frequency 2x / week   PT Duration 4 weeks  plus eval   PT Treatment/Interventions ADLs/Self Care Home Management;Functional mobility training;Gait training;DME Instruction;Therapeutic activities;Therapeutic exercise;Balance training;Neuromuscular re-education;Patient/family education   PT Next Visit Plan Initiate HEP for balance and strengthening-[place]; gait training  with RW   Consulted and Agree with Plan of Care Patient      Patient will benefit from skilled therapeutic intervention in order to improve the following deficits and impairments:  Abnormal gait, Decreased balance, Decreased mobility, Decreased strength, Decreased knowledge of use of DME, Difficulty walking  Visit Diagnosis: Other abnormalities of gait and mobility  Unsteadiness on feet  Muscle weakness (generalized)      G-Codes - 01/14/17 1328    Functional Assessment Tool Used (Outpatient Only) gait vel 1.06 f/tsec, Berg 30/56, TUG 30.25 sec   Functional Limitation Mobility: Walking and moving around   Mobility: Walking and Moving Around Current Status [phone]) At least 40 percent but less than 60 percent impaired, limited or restricted   Mobility: Walking and Moving Around Goal Status [phone]) At least 20 percent but less than 40 percent impaired, limited or restricted       Problem List Patient Active Problem List   Diagnosis Date Noted  . Senile purpura ([place]) 12/18/2016  . Immunization not carried out because of parent refusal 12/18/2016  . Advance care planning 02/18/2016  . Fatigue 10/20/2015  . Unequal blood pressure in upper extremities 10/20/2015  . Choledocholithiasis 02/11/2015  . Constipation 01/01/2015  . Cervical radiculopathy 12/10/2014  . Left knee pain 08/24/2014  . Vitamin D insufficiency 08/24/2014  . Carpal tunnel syndrome 01-14-2014  . 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 Preventative health care 08/15/2013  . CKD (chronic kidney disease) stage 4, GFR 15-29 ml/min (HCC) 02/16/2012  . Osteoarthritis of hand 02/16/2012  . Long-term (current) use of anticoagulants 08/11/2010  . Type 2 diabetes mellitus ([place]) 12/12/2006  . Hyperlipidemia 12/12/2006  . Anemia of chronic disease 12/12/2006  . Hypertension associated with diabetes ([place]) 12/12/2006  . History of CVA (cerebrovascular accident) 09/19/2006    Frazier Butt. 01-14-17, 1:31 PM Frazier Butt., PT  [facility] [address] [place], [place], [address] Phone: [phone]   Fax:  [phone]  Name: Chelsea Mcdaniel MRN: [id number] Date of Birth: 1928/01/19

## 2017-01-11 ENCOUNTER — Encounter: Payer: Self-pay | Admitting: Rehabilitation

## 2017-01-11 ENCOUNTER — Ambulatory Visit: Payer: Medicare Other | Admitting: Rehabilitation

## 2017-01-11 DIAGNOSIS — R2681 Unsteadiness on feet: Secondary | ICD-10-CM | POA: Diagnosis not present

## 2017-01-11 DIAGNOSIS — R2689 Other abnormalities of gait and mobility: Secondary | ICD-10-CM

## 2017-01-11 DIAGNOSIS — M6281 Muscle weakness (generalized): Secondary | ICD-10-CM

## 2017-01-11 NOTE — Therapy (Signed)
[facility] [address] [place], [place], [address] Phone: [phone]   Fax:  [phone]  Physical Therapy Treatment  Patient Details  Name: Chelsea Mcdaniel MRN: [id number] Date of Birth: 1927/08/11 Referring Provider: Charlott Rakes, MD  Encounter Date: 01/11/2017      PT End of Session - 01/11/17 1410    Visit Number 2   Number of Visits 9   Date for PT Re-Evaluation 03/08/17   Authorization Type UHC Medicare/Medicaid secondary-GCODE   PT Start Time 4403   PT Stop Time 1232   PT Time Calculation (min) 43 min   Equipment Utilized During Treatment Gait belt   Activity Tolerance Patient tolerated treatment well   Behavior During Therapy [facility] for tasks assessed/performed      Past Medical History:  Diagnosis Date  . Anemia     normocytic anemia with baseline hemoglobin 10-11  . Blindness of left eye     likely related to stroke, left cataract removed from that eye with complications  . Calculus gallbladder and bile duct with cholecystitis with obstruction 01/2015  . Chronic kidney disease 2006    left renal artery stenosis with probable hemodynamic significance, kidneys are normal in morphology without focal lesions or hydronephrosis this is based but cannot A. of the abdomen with and without contrast done on the 31st 2006  . CKD (chronic kidney disease), stage III   . CVA (cerebral vascular accident) ([place]) 1990's  . CVA (cerebrovascular accident) [facility])  October 2007    CT of the head atrophy with multiple remote insults noted but no definite acute findings  . Glaucoma   . Gout   . Hyperlipidemia   . Hypertension   . Personal history of DVT (deep vein thrombosis) 08/11/2010   BLE  . Type II diabetes mellitus ([facility])     Past Surgical History:  Procedure Laterality Date  . ABDOMINAL HYSTERECTOMY    . CATARACT EXTRACTION Left   . TRANSTHORACIC ECHOCARDIOGRAM   May 2008    left ventricular systolic function  normal EF estimated range of 55-60%, no definite diagnostic evidence of left ventricular regional wall motion abnormalities, Doppler parameters consistent with abnormal left ventricular relaxation, findings suggestive of possible bicuspid aortic valve and aortic valve thickness mildly increase    There were no vitals filed for this visit.      Subjective Assessment - 01/11/17 1153    Subjective Pt reports no changes since last visit, no pain, no falls.    Pertinent History DM, chronic kidney disease   Patient Stated Goals Pt's goal for therapy is to get steadier on my feet.   Currently in Pain? No/denies                              Balance Exercises - 01/11/17 1408      OTAGO PROGRAM   Head Movements Sitting;5 reps   Neck Movements Sitting;5 reps   Trunk Movements Standing;5 reps   Knee Flexor 10 reps   Hip ABductor 10 reps   Ankle Plantorflexors 20 reps, support   Ankle Dorsiflexors 20 reps, support   Knee Bends 10 reps, support   Backwards Walking Support  x 2 laps   Sideways Walking Assistive device  at counter top, 2 laps   Tandem Stance 10 seconds, support   Tandem Walk Support  x 2 laps           PT Education -  01/11/17 1410    Education provided Yes   Education Details OTAGO HEP   Person(s) Educated Patient   Methods Demonstration;Explanation;Handout   Comprehension Returned demonstration;Verbalized understanding          PT Short Term Goals - 01/07/17 1325      PT SHORT TERM GOAL #1   Title STGs= LTGs (4 weeks)           PT Long Term Goals - 01/07/17 1325      PT LONG TERM GOAL #1   Title Pt will perform HEP with family/aide supervision for improved balance and gait.  TARGET 02/06/17   Baseline No current HEP   Time 4   Period Weeks   Status New     PT LONG TERM GOAL #2   Title Pt will improve TUG score to less than or equal to 25 seconds for decreased fall risk.   Baseline TUG 30.25 sec   Time 4   Status New      PT LONG TERM GOAL #3   Title Pt will improve Berg Balance score to at least 35/56 for decreased fall risk.   Baseline Berg 30/56   Time 4   Period Weeks   Status New     PT LONG TERM GOAL #4   Title Pt will improve gait velocity to at least 1.3 ft/sec for improved gait efficiency and safety in the home.    Baseline gait velocity 1.06 ft/sec   Time 4   Period Weeks   Status New     PT LONG TERM GOAL #5   Title Pt will verbalize understanding of fall prevention in the home environment.   Time 4   Period Weeks   Status New     Additional Long Term Goals   Additional Long Term Goals Yes     PT LONG TERM GOAL #6   Title Pt will report improvement in ABC score by at least 20% for improved confidence in balance.   Baseline ABC score 23.8% at eval   Time 4   Period Weeks               Plan - 01/11/17 1410    Clinical Impression Statement Skilled session focused on initiation of HEP with OTAGO program for balance and strengthening.  Pt tolerated well.  Encouraged her to perform 4-5 exercises daily and to bring folder to next session.    Rehab Potential Good   PT Frequency 2x / week   PT Duration 4 weeks  plus eval   PT Treatment/Interventions ADLs/Self Care Home Management;Functional mobility training;Gait training;DME Instruction;Therapeutic activities;Therapeutic exercise;Balance training;Neuromuscular re-education;Patient/family education   PT Next Visit Plan Continue OTAGO, balance, gait training with RW vs SPC (if you can speak with daughter in law about getting cane with rubber tip)   Consulted and Agree with Plan of Care Patient      Patient will benefit from skilled therapeutic intervention in order to improve the following deficits and impairments:  Abnormal gait, Decreased balance, Decreased mobility, Decreased strength, Decreased knowledge of use of DME, Difficulty walking  Visit Diagnosis: Other abnormalities of gait and mobility  Unsteadiness on  feet  Muscle weakness (generalized)     Problem List Patient Active Problem List   Diagnosis Date Noted  . Senile purpura ([place]) 12/18/2016  . Immunization not carried out because of parent refusal 12/18/2016  . Advance care planning 02/18/2016  . Fatigue 10/20/2015  . Unequal blood pressure in upper extremities 10/20/2015  .  Choledocholithiasis 02/11/2015  . Constipation 01/01/2015  . Cervical radiculopathy 12/10/2014  . Left knee pain 08/24/2014  . Vitamin D insufficiency 08/24/2014  . Carpal tunnel syndrome 01/07/2014  . 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 Preventative health care 08/15/2013  . CKD (chronic kidney disease) stage 4, GFR 15-29 ml/min (HCC) 02/16/2012  . Osteoarthritis of hand 02/16/2012  . Long-term (current) use of anticoagulants 08/11/2010  . Type 2 diabetes mellitus ([place]) 12/12/2006  . Hyperlipidemia 12/12/2006  . Anemia of chronic disease 12/12/2006  . Hypertension associated with diabetes ([place]) 12/12/2006  . History of CVA (cerebrovascular accident) 09/19/2006    Cameron Sprang, PT, MPT [facility] [address] [place], [place], [address] Phone: [phone]   Fax:  [phone] 01/11/17, 2:12 PM  Name: Chelsea Mcdaniel MRN: [id number] Date of Birth: 02-17-1928

## 2017-01-13 ENCOUNTER — Encounter: Payer: Self-pay | Admitting: Physical Therapy

## 2017-01-13 ENCOUNTER — Ambulatory Visit: Payer: Medicare Other | Admitting: Physical Therapy

## 2017-01-13 DIAGNOSIS — M6281 Muscle weakness (generalized): Secondary | ICD-10-CM | POA: Diagnosis not present

## 2017-01-13 DIAGNOSIS — R2689 Other abnormalities of gait and mobility: Secondary | ICD-10-CM

## 2017-01-13 DIAGNOSIS — R2681 Unsteadiness on feet: Secondary | ICD-10-CM

## 2017-01-15 NOTE — Therapy (Signed)
[facility] [address] [place], [place], [address] Phone: [phone]   Fax:  [phone]  Physical Therapy Treatment  Patient Details  Name: Chelsea Mcdaniel MRN: [id number] Date of Birth: 1928/02/21 Referring Provider: Charlott Rakes, MD  Encounter Date: 01/13/2017     01/13/17 1110  PT Visits / Re-Eval  Visit Number 3  Number of Visits 9  Date for PT Re-Evaluation 03/08/17  Authorization  Authorization Type UHC Medicare/Medicaid secondary-GCODE  PT Time Calculation  PT Start Time 1104  PT Stop Time 1145  PT Time Calculation (min) 41 min  PT - End of Session  Equipment Utilized During Treatment Gait belt  Activity Tolerance Patient tolerated treatment well  Behavior During Therapy [facility] for tasks assessed/performed    Past Medical History:  Diagnosis Date  . Anemia     normocytic anemia with baseline hemoglobin 10-11  . Blindness of left eye     likely related to stroke, left cataract removed from that eye with complications  . Calculus gallbladder and bile duct with cholecystitis with obstruction 01/2015  . Chronic kidney disease 2006    left renal artery stenosis with probable hemodynamic significance, kidneys are normal in morphology without focal lesions or hydronephrosis this is based but cannot A. of the abdomen with and without contrast done on the 31st 2006  . CKD (chronic kidney disease), stage III   . CVA (cerebral vascular accident) ([place]) 1990's  . CVA (cerebrovascular accident) [facility])  October 2007    CT of the head atrophy with multiple remote insults noted but no definite acute findings  . Glaucoma   . Gout   . Hyperlipidemia   . Hypertension   . Personal history of DVT (deep vein thrombosis) 08/11/2010   BLE  . Type II diabetes mellitus ([place])     Past Surgical History:  Procedure Laterality Date  . ABDOMINAL HYSTERECTOMY    . CATARACT EXTRACTION Left   . TRANSTHORACIC ECHOCARDIOGRAM   May  2008    left ventricular systolic function normal EF estimated range of 55-60%, no definite diagnostic evidence of left ventricular regional wall motion abnormalities, Doppler parameters consistent with abnormal left ventricular relaxation, findings suggestive of possible bicuspid aortic valve and aortic valve thickness mildly increase    There were no vitals filed for this visit.     01/13/17 1109  Symptoms/Limitations  Subjective No new complaints. No falls or pain to report. Has not done the OTAGO HEP "I didn't know about them until this morning with they gave me the folder".   Pertinent History DM, chronic kidney disease  Patient Stated Goals Pt's goal for therapy is to get steadier on my feet.  Pain Assessment  Currently in Pain? No/denies      01/13/17 1114  Transfers  Transfers Sit to Stand;Stand to Sit  Sit to Stand 6: Modified independent (Device/Increase time);With upper extremity assist;From bed;From chair/3-in-1;Multiple attempts  Stand to Sit 6: Modified independent (Device/Increase time);With upper extremity assist;To bed;To chair/3-in-1;Uncontrolled descent  Ambulation/Gait  Ambulation/Gait Yes  Ambulation/Gait Assistance 5: Supervision;4: Min assist;4: Min guard  Ambulation/Gait Assistance Details pt's cane today does have rubber tip on it, however is too high. lowered to lowest point it goes with it just slightly too tall now. cues on step length and cane advancement/placement with gait once cane was adjusted. increased assistance needed for balance on outdoor surfaces- min assist on complaint surface and min gaurd on unlevel paved surfaces, due to increased instability   Ambulation  Distance (Feet) 100 Feet (x1, 115 x1 indoors; 300 in/outdoors combined)  Assistive device Straight cane  Gait Pattern Step-through pattern;Decreased stride length;Wide base of support  Ambulation Surface Level;Indoor;Unlevel;Outdoor;Paved;Gravel;Grass     01/13/17 1117  OTAGO PROGRAM   Head Movements Sitting;5 reps  Trunk Movements Standing;5 reps  Knee Flexor 10 reps  Hip ABductor 10 reps  Ankle Plantorflexors Level C  Ankle Dorsiflexors Level C  Knee Bends Level A  Backwards Walking Level B  Sideways Walking Level B  Tandem Stance Level A  Tandem Walk Level C  Overall OTAGO Comments verbal and visual cues needed for posture and for correct ex form. cues to slow down for improved form as well.           PT Short Term Goals - 01/07/17 1325      PT SHORT TERM GOAL #1   Title STGs= LTGs (4 weeks)           PT Long Term Goals - 01/07/17 1325      PT LONG TERM GOAL #1   Title Pt will perform HEP with family/aide supervision for improved balance and gait.  TARGET 02/06/17   Baseline No current HEP   Time 4   Period Weeks   Status New     PT LONG TERM GOAL #2   Title Pt will improve TUG score to less than or equal to 25 seconds for decreased fall risk.   Baseline TUG 30.25 sec   Time 4   Status New     PT LONG TERM GOAL #3   Title Pt will improve Berg Balance score to at least 35/56 for decreased fall risk.   Baseline Berg 30/56   Time 4   Period Weeks   Status New     PT LONG TERM GOAL #4   Title Pt will improve gait velocity to at least 1.3 ft/sec for improved gait efficiency and safety in the home.    Baseline gait velocity 1.06 ft/sec   Time 4   Period Weeks   Status New     PT LONG TERM GOAL #5   Title Pt will verbalize understanding of fall prevention in the home environment.   Time 4   Period Weeks   Status New     Additional Long Term Goals   Additional Long Term Goals Yes     PT LONG TERM GOAL #6   Title Pt will report improvement in ABC score by at least 20% for improved confidence in balance.   Baseline ABC score 23.8% at eval   Time 4   Period Weeks        01/13/17 1111  Plan  Clinical Impression Statement Today's skilled session focused on gait with cane on various surfaces now that pt has a rubber quad tip on her  cane and review of OTAGO that was issued a previous session. Did not add any new ones today as pt has not been doing the current ones at home. Pt is progressing toward goals and should benefit from continued PT to progress toward unmet goals.                 Pt will benefit from skilled therapeutic intervention in order to improve on the following deficits Abnormal gait;Decreased balance;Decreased mobility;Decreased strength;Decreased knowledge of use of DME;Difficulty walking  Rehab Potential Good  PT Frequency 2x / week  PT Duration 4 weeks (plus eval)  PT Treatment/Interventions ADLs/Self Care Home Management;Functional mobility training;Gait training;DME Instruction;Therapeutic  activities;Therapeutic exercise;Balance training;Neuromuscular re-education;Patient/family education  PT Next Visit Plan advance [place] program as indicated (see if pt is now doing this at home), continued to work on gait with cane, work on balance activities   Consulted and Agree with Plan of Care Patient       Patient will benefit from skilled therapeutic intervention in order to improve the following deficits and impairments:  Abnormal gait, Decreased balance, Decreased mobility, Decreased strength, Decreased knowledge of use of DME, Difficulty walking  Visit Diagnosis: Other abnormalities of gait and mobility  Unsteadiness on feet  Muscle weakness (generalized)     Problem List Patient Active Problem List   Diagnosis Date Noted  . Senile purpura ([place]) 12/18/2016  . Immunization not carried out because of parent refusal 12/18/2016  . Advance care planning 02/18/2016  . Fatigue 10/20/2015  . Unequal blood pressure in upper extremities 10/20/2015  . Choledocholithiasis 02/11/2015  . Constipation 01/01/2015  . Cervical radiculopathy 12/10/2014  . Left knee pain 08/24/2014  . Vitamin D insufficiency 08/24/2014  . Carpal tunnel syndrome 01/07/2014  . Chronic gout due to renal impairment without tophus  11/01/2013  . Preventative health care 08/15/2013  . CKD (chronic kidney disease) stage 4, GFR 15-29 ml/min (HCC) 02/16/2012  . Osteoarthritis of hand 02/16/2012  . Long-term (current) use of anticoagulants 08/11/2010  . Type 2 diabetes mellitus ([place]) 12/12/2006  . Hyperlipidemia 12/12/2006  . Anemia of chronic disease 12/12/2006  . Hypertension associated with diabetes ([place]) 12/12/2006  . History of CVA (cerebrovascular accident) 09/19/2006    [name], PTA, [place] [address] [place], [place] [address] [phone] 01/15/17, 9:19 AM  Name: Chelsea Mcdaniel MRN: [id number] Date of Birth: 09-Jun-1928

## 2017-01-17 ENCOUNTER — Ambulatory Visit (INDEPENDENT_AMBULATORY_CARE_PROVIDER_SITE_OTHER): Payer: Medicare Other | Admitting: Pharmacist

## 2017-01-17 DIAGNOSIS — Z86718 Personal history of other venous thrombosis and embolism: Secondary | ICD-10-CM

## 2017-01-17 DIAGNOSIS — Z7901 Long term (current) use of anticoagulants: Secondary | ICD-10-CM

## 2017-01-17 LAB — POCT INR: INR: 2.6

## 2017-01-17 NOTE — Progress Notes (Signed)
Anticoagulation Management Chelsea Mcdaniel is a 83 y.o. female who reports to the clinic for monitoring of warfarin treatment.    Indication: DVT , history of  Duration: indefinite Supervising physician: [facility] Visit History: Patient does not report signs/symptoms of bleeding or thromboembolism  Other recent changes: No diet, medications, lifestyle changes endorsed.  Anticoagulation Episode Summary    Current INR goal:   2.0-3.0  TTR:   64.0 % (3 y)  Next INR check:   02/14/2017  INR from last check:   2.60 (01/17/2017)  Weekly max warfarin dose:     Target end date:     INR check location:     Preferred lab:     Send INR reminders to:      Indications   EMBOLISM/THROMBOSIS DEEP VSL LWR EXTRM NOS (Resolved) [I82.409] Long-term (current) use of anticoagulants [Z79.01]       Comments:          ASSESSMENT Recent Results: The most recent result is correlated with 37.5 mg per week: Lab Results  Component Value Date   INR 2.60 01/17/2017   INR 2.80 12/27/2016   INR 2.20 11/29/2016    Anticoagulation Dosing: INR as of 01/17/2017 and Previous Warfarin Dosing Information    INR Dt INR Goal [employer] Sun Mon Tue Wed Thu Fri Sat   01/17/2017 2.60 2.0-3.0 37.5 mg 7.5 mg 5 mg 5 mg 5 mg 5 mg 5 mg 5 mg    Previous description   Take 1 tablet by mouth once-daily at 6PM on all days of the week--EXCEPT on Sundays, take 1 & 1/2 tablets on SUNDAYS.    Anticoagulation Warfarin Dose Instructions as of 01/17/2017      Total Sun Mon Tue Wed Thu Fri Sat   New Dose 35 mg 5 mg 5 mg 5 mg 5 mg 5 mg 5 mg 5 mg     (5 mg x 1)  (5 mg x 1)  (5 mg x 1)  (5 mg x 1)  (5 mg x 1)  (5 mg x 1)  (5 mg x 1)                         Description   Take 1 tablet by mouth once-daily at [facility] on all days of the week.      INR today: Therapeutic  PLAN Weekly dose was decreased by 6% to 35 mg per week  Patient Instructions  Patient instructed to take medications as defined  in the Anti-coagulation Track section of this encounter.  Patient instructed to take today's dose.  Patient instructed to take ONE (1) tablet of your 5mg  peach-colored warfarin tablets by mouth once-daily at [facility] each day. Patient verbalized understanding of these instructions.  Patient verbalized understanding of these instructions.     Patient advised to contact clinic or seek medical attention if signs/symptoms of bleeding or thromboembolism occur.  Patient verbalized understanding by repeating back information and was advised to contact me if further medication-related questions arise. Patient was also provided an information handout.  Follow-up Return in 4 weeks (on 02/14/2017) for Follow up INR at 4PM.  Ceasar Lund, Eustaquio Boyden PharmD, CACP, CPP  15 minutes spent face-to-face with the patient during the encounter. 50% of time spent on education. 50% of time was spent on finger-stick, point of care INR sample collection, processing, interpretation, and data entry into EPIC/CHL and [email or web address]

## 2017-01-17 NOTE — Patient Instructions (Signed)
Patient instructed to take medications as defined in the Anti-coagulation Track section of this encounter.  Patient instructed to take today's dose.  Patient instructed to take ONE (1) tablet of your 5mg  peach-colored warfarin tablets by mouth once-daily at [facility] each day. Patient verbalized understanding of these instructions.  Patient verbalized understanding of these instructions.

## 2017-01-18 NOTE — Progress Notes (Signed)
I reviewed Dr. Gladstone Pih note.  Patient is on coumadin for h/o DVT.  INR normal.

## 2017-01-20 ENCOUNTER — Other Ambulatory Visit: Payer: Self-pay | Admitting: Internal Medicine

## 2017-01-20 DIAGNOSIS — E1159 Type 2 diabetes mellitus with other circulatory complications: Secondary | ICD-10-CM

## 2017-01-20 DIAGNOSIS — I10 Essential (primary) hypertension: Principal | ICD-10-CM

## 2017-01-21 ENCOUNTER — Ambulatory Visit: Payer: Medicare Other | Admitting: Rehabilitation

## 2017-01-21 ENCOUNTER — Encounter: Payer: Self-pay | Admitting: Rehabilitation

## 2017-01-21 DIAGNOSIS — R2681 Unsteadiness on feet: Secondary | ICD-10-CM | POA: Diagnosis not present

## 2017-01-21 DIAGNOSIS — M6281 Muscle weakness (generalized): Secondary | ICD-10-CM

## 2017-01-21 DIAGNOSIS — R2689 Other abnormalities of gait and mobility: Secondary | ICD-10-CM | POA: Diagnosis not present

## 2017-01-21 NOTE — Therapy (Signed)
[facility] [address] [place], [place], [address] Phone: [phone]   Fax:  [phone]  Physical Therapy Treatment  Patient Details  Name: Chelsea Mcdaniel MRN: [id number] Date of Birth: Apr 14, 1928 Referring Provider: Charlott Rakes, MD  Encounter Date: 01/21/2017      PT End of Session - 01/21/17 0851    Visit Number 4   Number of Visits 9   Date for PT Re-Evaluation 03/08/17   Authorization Type UHC Medicare/Medicaid secondary-GCODE   PT Start Time 0849   PT Stop Time 0931   PT Time Calculation (min) 42 min   Equipment Utilized During Treatment Gait belt   Activity Tolerance Patient tolerated treatment well   Behavior During Therapy [facility] for tasks assessed/performed      Past Medical History:  Diagnosis Date  . Anemia     normocytic anemia with baseline hemoglobin 10-11  . Blindness of left eye     likely related to stroke, left cataract removed from that eye with complications  . Calculus gallbladder and bile duct with cholecystitis with obstruction 01/2015  . Chronic kidney disease 2006    left renal artery stenosis with probable hemodynamic significance, kidneys are normal in morphology without focal lesions or hydronephrosis this is based but cannot A. of the abdomen with and without contrast done on the 31st 2006  . CKD (chronic kidney disease), stage III   . CVA (cerebral vascular accident) ([place]) 1990's  . CVA (cerebrovascular accident) [facility])  October 2007    CT of the head atrophy with multiple remote insults noted but no definite acute findings  . Glaucoma   . Gout   . Hyperlipidemia   . Hypertension   . Personal history of DVT (deep vein thrombosis) 08/11/2010   BLE  . Type II diabetes mellitus ([place])     Past Surgical History:  Procedure Laterality Date  . ABDOMINAL HYSTERECTOMY    . CATARACT EXTRACTION Left   . TRANSTHORACIC ECHOCARDIOGRAM   May 2008    left ventricular systolic function  normal EF estimated range of 55-60%, no definite diagnostic evidence of left ventricular regional wall motion abnormalities, Doppler parameters consistent with abnormal left ventricular relaxation, findings suggestive of possible bicuspid aortic valve and aortic valve thickness mildly increase    There were no vitals filed for this visit.      Subjective Assessment - 01/21/17 0851    Subjective Reports doing exercises every other day with home health aide.     Pertinent History DM, chronic kidney disease   Patient Stated Goals Pt's goal for therapy is to get steadier on my feet.   Currently in Pain? No/denies                         [facility] Adult PT Treatment/Exercise - 01/21/17 0900      Ambulation/Gait   Ambulation/Gait Yes   Ambulation/Gait Assistance 5: Supervision;4: Min assist   Ambulation/Gait Assistance Details First worked on gait indoors with [facility].   Ambulated x 300' at min/guard to min A due to single LOB requiring min A to correct.  Asked pt if she had a RW at home.  She reports that she does and therefore ambulated another 300' with RW at S (more distant) level with min cues for increased stride length.     Ambulation Distance (Feet) 300 Feet  x 2 reps   Assistive device Straight cane;Rolling walker   Gait Pattern Step-through pattern;Decreased stride  length;Wide base of support   Ambulation Surface Level;Indoor     Exercises   Exercises Other Exercises   Other Exercises  seated nustep x 2 mins with BLEs only to improve overall strength and endurance at level 2 resistance.  Tolerated well.               Balance Exercises - 01/21/17 0913      OTAGO PROGRAM   One Leg Stand 10 seconds, support  x 3 reps on each side   Heel Walking Support  x 2 laps down and back   Toe Walk Support  x 2 laps down and back   Heel Toe Walking Backward No support  with support x 2 laps down and back   Sit to Stand 20 reps, one support  10 reps, trialed w/o UE,  unable   Overall OTAGO Comments Cues to slow speed with exercises and continued education to perform daily if able.            PT Education - 01/21/17 1026    Education provided Yes   Education Details Continued education on [employer]) Educated Patient   Methods Explanation;Demonstration;Handout   Comprehension Verbalized understanding;Returned demonstration          PT Short Term Goals - 01/07/17 1325      PT SHORT TERM GOAL #1   Title STGs= LTGs (4 weeks)           PT Long Term Goals - 01/07/17 1325      PT LONG TERM GOAL #1   Title Pt will perform HEP with family/aide supervision for improved balance and gait.  TARGET 02/06/17   Baseline No current HEP   Time 4   Period Weeks   Status New     PT LONG TERM GOAL #2   Title Pt will improve TUG score to less than or equal to 25 seconds for decreased fall risk.   Baseline TUG 30.25 sec   Time 4   Status New     PT LONG TERM GOAL #3   Title Pt will improve Berg Balance score to at least 35/56 for decreased fall risk.   Baseline Berg 30/56   Time 4   Period Weeks   Status New     PT LONG TERM GOAL #4   Title Pt will improve gait velocity to at least 1.3 ft/sec for improved gait efficiency and safety in the home.    Baseline gait velocity 1.06 ft/sec   Time 4   Period Weeks   Status New     PT LONG TERM GOAL #5   Title Pt will verbalize understanding of fall prevention in the home environment.   Time 4   Period Weeks   Status New     Additional Long Term Goals   Additional Long Term Goals Yes     PT LONG TERM GOAL #6   Title Pt will report improvement in ABC score by at least 20% for improved confidence in balance.   Baseline ABC score 23.8% at eval   Time 4   Period Weeks               Plan - 01/21/17 8546    Clinical Impression Statement Skilled session focused on gait with SPC vs RW.   Note that she is much safer with RW, therefore recommended that she use RW at all times instead  of cane to improve safety and balance.  Also continued to  go over [place].  Tolerated well.     Rehab Potential Good   PT Frequency 2x / week   PT Duration 4 weeks  plus eval   PT Treatment/Interventions ADLs/Self Care Home Management;Functional mobility training;Gait training;DME Instruction;Therapeutic activities;Therapeutic exercise;Balance training;Neuromuscular re-education;Patient/family education   PT Next Visit Plan Pt supposed to be using RW (can continue gait training with cane if she does not remember-refuses, but would def recommend RW for outdoor gait), balance, and functional LE strength, endurance   Consulted and Agree with Plan of Care Patient      Patient will benefit from skilled therapeutic intervention in order to improve the following deficits and impairments:  Abnormal gait, Decreased balance, Decreased mobility, Decreased strength, Decreased knowledge of use of DME, Difficulty walking  Visit Diagnosis: Other abnormalities of gait and mobility  Unsteadiness on feet  Muscle weakness (generalized)     Problem List Patient Active Problem List   Diagnosis Date Noted  . Senile purpura ([place]) 12/18/2016  . Immunization not carried out because of parent refusal 12/18/2016  . Advance care planning 02/18/2016  . Fatigue 10/20/2015  . Unequal blood pressure in upper extremities 10/20/2015  . Choledocholithiasis 02/11/2015  . Constipation 01/01/2015  . Cervical radiculopathy 12/10/2014  . Left knee pain 08/24/2014  . Vitamin D insufficiency 08/24/2014  . Carpal tunnel syndrome 01/07/2014  . 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 Preventative health care 08/15/2013  . CKD (chronic kidney disease) stage 4, GFR 15-29 ml/min (HCC) 02/16/2012  . Osteoarthritis of hand 02/16/2012  . Long-term (current) use of anticoagulants 08/11/2010  . Type 2 diabetes mellitus ([place]) 12/12/2006  . Hyperlipidemia 12/12/2006  . Anemia of chronic disease  12/12/2006  . Hypertension associated with diabetes ([place]) 12/12/2006  . History of CVA (cerebrovascular accident) 09/19/2006    Cameron Sprang, PT, MPT [facility] [address] [place], [place], [address] Phone: [phone]   Fax:  [phone] 01/21/17, 10:30 AM  Name: Chelsea Mcdaniel MRN: [id number] Date of Birth: Nov 12, 1927

## 2017-01-24 ENCOUNTER — Ambulatory Visit: Payer: Medicare Other | Attending: Internal Medicine | Admitting: Physical Therapy

## 2017-01-24 DIAGNOSIS — R2689 Other abnormalities of gait and mobility: Secondary | ICD-10-CM | POA: Diagnosis not present

## 2017-01-24 DIAGNOSIS — R2681 Unsteadiness on feet: Secondary | ICD-10-CM | POA: Insufficient documentation

## 2017-01-24 DIAGNOSIS — M6281 Muscle weakness (generalized): Secondary | ICD-10-CM | POA: Diagnosis not present

## 2017-01-24 NOTE — Therapy (Signed)
[facility] [address] [place], [place], [address] Phone: [phone]   Fax:  [phone]  Physical Therapy Treatment  Patient Details  Name: Chelsea Mcdaniel MRN: [id number] Date of Birth: 09-07-1927 Referring Provider: Charlott Rakes, MD  Encounter Date: 01/24/2017      PT End of Session - 01/24/17 2205    Visit Number 5   Number of Visits 9   Date for PT Re-Evaluation 03/08/17   Authorization Type UHC Medicare/Medicaid secondary-GCODE   PT Start Time 1234   PT Stop Time 1315   PT Time Calculation (min) 41 min   Equipment Utilized During Treatment Gait belt   Activity Tolerance Patient tolerated treatment well   Behavior During Therapy [facility] for tasks assessed/performed      Past Medical History:  Diagnosis Date  . Anemia     normocytic anemia with baseline hemoglobin 10-11  . Blindness of left eye     likely related to stroke, left cataract removed from that eye with complications  . Calculus gallbladder and bile duct with cholecystitis with obstruction 01/2015  . Chronic kidney disease 2006    left renal artery stenosis with probable hemodynamic significance, kidneys are normal in morphology without focal lesions or hydronephrosis this is based but cannot A. of the abdomen with and without contrast done on the 31st 2006  . CKD (chronic kidney disease), stage III   . CVA (cerebral vascular accident) ([place]) 1990's  . CVA (cerebrovascular accident) [facility])  October 2007    CT of the head atrophy with multiple remote insults noted but no definite acute findings  . Glaucoma   . Gout   . Hyperlipidemia   . Hypertension   . Personal history of DVT (deep vein thrombosis) 08/11/2010   BLE  . Type II diabetes mellitus ([place])     Past Surgical History:  Procedure Laterality Date  . ABDOMINAL HYSTERECTOMY    . CATARACT EXTRACTION Left   . TRANSTHORACIC ECHOCARDIOGRAM   May 2008    left ventricular systolic function  normal EF estimated range of 55-60%, no definite diagnostic evidence of left ventricular regional wall motion abnormalities, Doppler parameters consistent with abnormal left ventricular relaxation, findings suggestive of possible bicuspid aortic valve and aortic valve thickness mildly increase    There were no vitals filed for this visit.      Subjective Assessment - 01/24/17 1238    Subjective No changes since last visit.  Reports trying her exercises at home.  Reports she has mostly been using cane-its harder to maneuver the walker in and out of cars.   Pertinent History DM, chronic kidney disease   Patient Stated Goals Pt's goal for therapy is to get steadier on my feet.   Currently in Pain? No/denies                         [facility] Adult PT Treatment/Exercise - 01/24/17 1307      Ambulation/Gait   Ambulation/Gait Yes   Ambulation/Gait Assistance 5: Supervision;4: Min assist   Ambulation/Gait Assistance Details Gait training with straight cane (pt states she will likely not use RW), with turns and change of direction.  Pt has two episodes of foot catching with LOB during gait, able to regain balance with therapist supervision.     Ambulation Distance (Feet) 120 Feet  50 ft x 2, then 150 ft; 120 ft    Assistive device Straight cane   Gait Pattern Step-through pattern;Decreased  stride length;Wide base of support   Ambulation Surface Level;Indoor       Review of HEP:      Balance Exercises - 01/24/17 1240      OTAGO PROGRAM   Head Movements Sitting;5 reps   Neck Movements Sitting;5 reps   Trunk Movements Standing;5 reps   Knee Flexor 10 reps   Hip ABductor 10 reps   Ankle Plantorflexors 20 reps, support  15 reps   Ankle Dorsiflexors 20 reps, support  15 reps-   Knee Bends 10 reps, support   Backwards Walking Support   Sideways Walking Assistive device   Tandem Stance 10 seconds, support   Tandem Walk Support   One Leg Stand 10 seconds, support   Heel  Walking Support   Toe Walk Support   Heel Toe Walking Backward No support  with support of counter   Sit to Stand --  10 reps one UE support   Overall OTAGO Comments Reviewed full OTAGO HEP:  PT provides visual and verbal cues for reminders of technique of exercise.  Reminded patient to have aide or family member for supervision while doing HEP.             PT Short Term Goals - 01/07/17 1325      PT SHORT TERM GOAL #1   Title STGs= LTGs (4 weeks)           PT Long Term Goals - 01/07/17 1325      PT LONG TERM GOAL #1   Title Pt will perform HEP with family/aide supervision for improved balance and gait.  TARGET 02/06/17   Baseline No current HEP   Time 4   Period Weeks   Status New     PT LONG TERM GOAL #2   Title Pt will improve TUG score to less than or equal to 25 seconds for decreased fall risk.   Baseline TUG 30.25 sec   Time 4   Status New     PT LONG TERM GOAL #3   Title Pt will improve Berg Balance score to at least 35/56 for decreased fall risk.   Baseline Berg 30/56   Time 4   Period Weeks   Status New     PT LONG TERM GOAL #4   Title Pt will improve gait velocity to at least 1.3 ft/sec for improved gait efficiency and safety in the home.    Baseline gait velocity 1.06 ft/sec   Time 4   Period Weeks   Status New     PT LONG TERM GOAL #5   Title Pt will verbalize understanding of fall prevention in the home environment.   Time 4   Period Weeks   Status New     Additional Long Term Goals   Additional Long Term Goals Yes     PT LONG TERM GOAL #6   Title Pt will report improvement in ABC score by at least 20% for improved confidence in balance.   Baseline ABC score 23.8% at eval   Time 4   Period Weeks               Plan - 01/24/17 2206    Clinical Impression Statement Skilled PT session focused on review of full OTAGO HEP, with pt needing cues for correct performance of some exercises (reports she does the exercises with her aide's  assistance).  Gait training with cane, as pt reports she will likely not use RW.  Briefly reiterated safety concerns with  cane and improved balance with RW.  Will continue to benefit from PT towards LTGs.   Rehab Potential Good   PT Frequency 2x / week   PT Duration 4 weeks  plus eval   PT Treatment/Interventions ADLs/Self Care Home Management;Functional mobility training;Gait training;DME Instruction;Therapeutic activities;Therapeutic exercise;Balance training;Neuromuscular re-education;Patient/family education   PT Next Visit Plan Continue to work on balance, functional lower extremity stregnthening and gait training cane/RW   Consulted and Agree with Plan of Care Patient      Patient will benefit from skilled therapeutic intervention in order to improve the following deficits and impairments:  Abnormal gait, Decreased balance, Decreased mobility, Decreased strength, Decreased knowledge of use of DME, Difficulty walking  Visit Diagnosis: Unsteadiness on feet  Other abnormalities of gait and mobility     Problem List Patient Active Problem List   Diagnosis Date Noted  . Senile purpura ([place]) 12/18/2016  . Immunization not carried out because of parent refusal 12/18/2016  . Advance care planning 02/18/2016  . Fatigue 10/20/2015  . Unequal blood pressure in upper extremities 10/20/2015  . Choledocholithiasis 02/11/2015  . Constipation 01/01/2015  . Cervical radiculopathy 12/10/2014  . Left knee pain 08/24/2014  . Vitamin D insufficiency 08/24/2014  . Carpal tunnel syndrome 01/07/2014  . 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 Preventative health care 08/15/2013  . CKD (chronic kidney disease) stage 4, GFR 15-29 ml/min (HCC) 02/16/2012  . Osteoarthritis of hand 02/16/2012  . Long-term (current) use of anticoagulants 08/11/2010  . Type 2 diabetes mellitus ([place]) 12/12/2006  . Hyperlipidemia 12/12/2006  . Anemia of chronic disease 12/12/2006  .  Hypertension associated with diabetes ([place]) 12/12/2006  . History of CVA (cerebrovascular accident) 09/19/2006    Frazier Butt. 01/24/2017, 10:11 PM  Frazier Butt., PT   [facility] [address] [place], [place], [address] Phone: [phone]   Fax:  [phone]  Name: Chelsea Mcdaniel MRN: [id number] Date of Birth: 08-20-1927

## 2017-01-28 ENCOUNTER — Ambulatory Visit: Payer: Medicare Other | Admitting: Physical Therapy

## 2017-01-28 ENCOUNTER — Encounter: Payer: Self-pay | Admitting: Physical Therapy

## 2017-01-28 DIAGNOSIS — M6281 Muscle weakness (generalized): Secondary | ICD-10-CM | POA: Diagnosis not present

## 2017-01-28 DIAGNOSIS — R2689 Other abnormalities of gait and mobility: Secondary | ICD-10-CM

## 2017-01-28 DIAGNOSIS — R2681 Unsteadiness on feet: Secondary | ICD-10-CM | POA: Diagnosis not present

## 2017-01-28 NOTE — Therapy (Signed)
[facility] [address] [place], [place], [address] Phone: [phone]   Fax:  [phone]  Physical Therapy Treatment  Patient Details  Name: Chelsea Mcdaniel MRN: [id number] Date of Birth: August 12, 1927 Referring Provider: Charlott Rakes, MD  Encounter Date: 01/28/2017      PT End of Session - 01/28/17 1625    Visit Number 6   Number of Visits 9   PT Start Time 1530   PT Stop Time 7341   PT Time Calculation (min) 45 min   Activity Tolerance Patient tolerated treatment well      Past Medical History:  Diagnosis Date  . Anemia     normocytic anemia with baseline hemoglobin 10-11  . Blindness of left eye     likely related to stroke, left cataract removed from that eye with complications  . Calculus gallbladder and bile duct with cholecystitis with obstruction 01/2015  . Chronic kidney disease 2006    left renal artery stenosis with probable hemodynamic significance, kidneys are normal in morphology without focal lesions or hydronephrosis this is based but cannot A. of the abdomen with and without contrast done on the 31st 2006  . CKD (chronic kidney disease), stage III   . CVA (cerebral vascular accident) ([place]) 1990's  . CVA (cerebrovascular accident) [facility])  October 2007    CT of the head atrophy with multiple remote insults noted but no definite acute findings  . Glaucoma   . Gout   . Hyperlipidemia   . Hypertension   . Personal history of DVT (deep vein thrombosis) 08/11/2010   BLE  . Type II diabetes mellitus ([place])     Past Surgical History:  Procedure Laterality Date  . ABDOMINAL HYSTERECTOMY    . CATARACT EXTRACTION Left   . TRANSTHORACIC ECHOCARDIOGRAM   May 2008    left ventricular systolic function normal EF estimated range of 55-60%, no definite diagnostic evidence of left ventricular regional wall motion abnormalities, Doppler parameters consistent with abnormal left ventricular relaxation, findings  suggestive of possible bicuspid aortic valve and aortic valve thickness mildly increase    There were no vitals filed for this visit.      Subjective Assessment - 01/28/17 1536    Subjective Feels like her walking is getting steadier; shes getting better with her ex's as well; 'im trying to walk better'  no falls; not using cane in home uses furniture and walls if she needs to ; can get around the house pretty good w/o cane;    Currently in Pain? No/denies          Neuro muscular re-ed  Using FSST set up mulit bouts of step reflex exs;  Stepping mult -direction; gait belt/cga  Sit to stand ; focus on eccentric; no UE support  X 15   TUG; used set up to practice sit to stand; walking w/o support and turn 180; vq's for speed;  Gait belt and cga throughout  19 sec , 18 , 14 , 14, 13, 13    Amb ; level in clinic 3:55 min   420'  with gait belt and CGA  No cane  1.01 ft/sec    Ramp and step/curb ( in clinic )  3 bouts; Steps 3 bouts; vq's for safety ; CGA with gait belt  Floor transfer x 1; stand down to high kneel on cushion then sat on floor; she got up with cga/gait belt   1 bout;  PT Short Term Goals - 01/07/17 1325      PT SHORT TERM GOAL #1   Title STGs= LTGs (4 weeks)           PT Long Term Goals - 01/07/17 1325      PT LONG TERM GOAL #1   Title Pt will perform HEP with family/aide supervision for improved balance and gait.  TARGET 02/06/17   Baseline No current HEP   Time 4   Period Weeks   Status New     PT LONG TERM GOAL #2   Title Pt will improve TUG score to less than or equal to 25 seconds for decreased fall risk.   Baseline TUG 30.25 sec   Time 4   Status New     PT LONG TERM GOAL #3   Title Pt will improve Berg Balance score to at least 35/56 for decreased fall risk.   Baseline Berg 30/56   Time 4   Period Weeks   Status New     PT LONG TERM GOAL #4   Title Pt will improve gait velocity to  at least 1.3 ft/sec for improved gait efficiency and safety in the home.    Baseline gait velocity 1.06 ft/sec   Time 4   Period Weeks   Status New     PT LONG TERM GOAL #5   Title Pt will verbalize understanding of fall prevention in the home environment.   Time 4   Period Weeks   Status New     Additional Long Term Goals   Additional Long Term Goals Yes     PT LONG TERM GOAL #6   Title Pt will report improvement in ABC score by at least 20% for improved confidence in balance.   Baseline ABC score 23.8% at eval   Time 4   Period Weeks               Plan - 01/28/17 1625    Clinical Impression Statement Pt tolerated the tx well; her ability to step backwards during FSST ex's improved within the session; as did her gait speed and control of turning 180 deg during TUG;  she was very surprised to be able to do the floor transfer; no pain; it was max effort but she stated she could see how that will help with her confidence; recommended she always have her cane in hand  vs dependin on furntture and wallls in her home for UE support    Clinical Presentation Stable   PT Treatment/Interventions ADLs/Self Care Home Management;Functional mobility training;Gait training;DME Instruction;Therapeutic activities;Therapeutic exercise;Balance training;Neuromuscular re-education;Patient/family education   PT Next Visit Plan If willing do multi bouts of floor transfers; cont to challenge her step reflex and gait speed   Consulted and Agree with Plan of Care Patient      Patient will benefit from skilled therapeutic intervention in order to improve the following deficits and impairments:  Abnormal gait, Decreased balance, Decreased mobility, Decreased strength, Decreased knowledge of use of DME, Difficulty walking  Visit Diagnosis: Unsteadiness on feet  Other abnormalities of gait and mobility  Muscle weakness (generalized)     Problem List Patient Active Problem List   Diagnosis  Date Noted  . Senile purpura ([place]) 12/18/2016  . Immunization not carried out because of parent refusal 12/18/2016  . Advance care planning 02/18/2016  . Fatigue 10/20/2015  . Unequal blood pressure in upper extremities 10/20/2015  . Choledocholithiasis 02/11/2015  . Constipation 01/01/2015  . Cervical  radiculopathy 12/10/2014  . Left knee pain 08/24/2014  . Vitamin D insufficiency 08/24/2014  . Carpal tunnel syndrome 01/07/2014  . 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 Preventative health care 08/15/2013  . CKD (chronic kidney disease) stage 4, GFR 15-29 ml/min (HCC) 02/16/2012  . Osteoarthritis of hand 02/16/2012  . Long-term (current) use of anticoagulants 08/11/2010  . Type 2 diabetes mellitus ([place]) 12/12/2006  . Hyperlipidemia 12/12/2006  . Anemia of chronic disease 12/12/2006  . Hypertension associated with diabetes ([place]) 12/12/2006  . History of CVA (cerebrovascular accident) 09/19/2006    Jana Hakim PT DPT  01/28/2017, 4:30 PM  [facility] [address] [place], [place], [address] Phone: [phone]   Fax:  [phone]  Name: Chelsea Mcdaniel MRN: [id number] Date of Birth: Nov 10, 1927

## 2017-01-31 DIAGNOSIS — I10 Essential (primary) hypertension: Secondary | ICD-10-CM | POA: Diagnosis not present

## 2017-01-31 DIAGNOSIS — N184 Chronic kidney disease, stage 4 (severe): Secondary | ICD-10-CM | POA: Diagnosis not present

## 2017-01-31 DIAGNOSIS — R600 Localized edema: Secondary | ICD-10-CM | POA: Diagnosis not present

## 2017-01-31 DIAGNOSIS — R809 Proteinuria, unspecified: Secondary | ICD-10-CM | POA: Diagnosis not present

## 2017-01-31 DIAGNOSIS — I129 Hypertensive chronic kidney disease with stage 1 through stage 4 chronic kidney disease, or unspecified chronic kidney disease: Secondary | ICD-10-CM | POA: Diagnosis not present

## 2017-01-31 DIAGNOSIS — N2581 Secondary hyperparathyroidism of renal origin: Secondary | ICD-10-CM | POA: Diagnosis not present

## 2017-02-01 ENCOUNTER — Ambulatory Visit: Payer: Medicare Other | Admitting: Physical Therapy

## 2017-02-01 DIAGNOSIS — R2681 Unsteadiness on feet: Secondary | ICD-10-CM | POA: Diagnosis not present

## 2017-02-01 DIAGNOSIS — M6281 Muscle weakness (generalized): Secondary | ICD-10-CM | POA: Diagnosis not present

## 2017-02-01 DIAGNOSIS — R2689 Other abnormalities of gait and mobility: Secondary | ICD-10-CM | POA: Diagnosis not present

## 2017-02-01 NOTE — Therapy (Signed)
[facility] [address] [place], [place], [address] Phone: [phone]   Fax:  [phone]  Physical Therapy Treatment  Patient Details  Name: Chelsea Mcdaniel MRN: [id number] Date of Birth: 11-15-1927 Referring Provider: Charlott Rakes, MD  Encounter Date: 02/01/2017      PT End of Session - 02/01/17 2017    Visit Number 7   Number of Visits 9   Date for PT Re-Evaluation 03/08/17   Authorization Type UHC Medicare/Medicaid secondary-GCODE   PT Start Time 1152   PT Stop Time 1236   PT Time Calculation (min) 44 min   Equipment Utilized During Treatment Gait belt   Activity Tolerance Patient tolerated treatment well   Behavior During Therapy [facility] for tasks assessed/performed      Past Medical History:  Diagnosis Date  . Anemia     normocytic anemia with baseline hemoglobin 10-11  . Blindness of left eye     likely related to stroke, left cataract removed from that eye with complications  . Calculus gallbladder and bile duct with cholecystitis with obstruction 01/2015  . Chronic kidney disease 2006    left renal artery stenosis with probable hemodynamic significance, kidneys are normal in morphology without focal lesions or hydronephrosis this is based but cannot A. of the abdomen with and without contrast done on the 31st 2006  . CKD (chronic kidney disease), stage III   . CVA (cerebral vascular accident) ([place]) 1990's  . CVA (cerebrovascular accident) [facility])  October 2007    CT of the head atrophy with multiple remote insults noted but no definite acute findings  . Glaucoma   . Gout   . Hyperlipidemia   . Hypertension   . Personal history of DVT (deep vein thrombosis) 08/11/2010   BLE  . Type II diabetes mellitus ([place])     Past Surgical History:  Procedure Laterality Date  . ABDOMINAL HYSTERECTOMY    . CATARACT EXTRACTION Left   . TRANSTHORACIC ECHOCARDIOGRAM   May 2008    left ventricular systolic function  normal EF estimated range of 55-60%, no definite diagnostic evidence of left ventricular regional wall motion abnormalities, Doppler parameters consistent with abnormal left ventricular relaxation, findings suggestive of possible bicuspid aortic valve and aortic valve thickness mildly increase    There were no vitals filed for this visit.      Subjective Assessment - 02/01/17 1157    Subjective No falls, no changes since last visit.   Pertinent History DM, chronic kidney disease   Patient Stated Goals Pt's goal for therapy is to get steadier on my feet.   Currently in Pain? No/denies                         [facility] Adult PT Treatment/Exercise - 02/01/17 1205      Transfers   Transfers Sit to Stand;Stand to Sit   Sit to Stand 6: Modified independent (Device/Increase time);With upper extremity assist;From bed;From chair/3-in-1;Multiple attempts   Stand to Sit 6: Modified independent (Device/Increase time);With upper extremity assist;To bed;To chair/3-in-1;Uncontrolled descent   Comments Floor>stand transfer training, with pt requiring min guard assistance from side sit to quadruped, then min assist from quadruped>tall knee>half kneel to stand.  Discussed pt needing a phone or LifeLine/Life Alert in the case of falling and needing to call for assistance.     Ambulation/Gait   Ambulation/Gait Yes   Ambulation/Gait Assistance 5: Supervision;4: Min assist   Ambulation/Gait Assistance Details Gait training  activities indoors with environmental scanning tasks, ramp, and then outdoor gait.  Pt has episode on indoor ramp and outdoor ramp of foot catching with gait, able to regain balance, but therapist provides min guard/min assistance.   Ambulation Distance (Feet) 320 Feet  200, then 100 x 2   Assistive device Straight cane   Gait Pattern Step-through pattern;Decreased stride length;Wide base of support;Poor foot clearance - left;Poor foot clearance - right   Ambulation Surface  Level;Indoor;Unlevel;Outdoor   Gait velocity 24.54 sec (1.34 ft/sec), 20.34 sec (1.61 ft/sec)   Ramp 5: Supervision   Ramp Details (indicate cue type and reason) 2 reps with cane, cues for posture and foot clearance   Gait Comments Outdoor gait training with cane x 80 ft on sidewalk (1 LOB on incline), then 10 ft of grass negotiation with cane, with significantly slowed gait pace and guarded L arm position.  Pt reports she walks outside on sidewalk and on grass using cane.     Standardized Balance Assessment   Standardized Balance Assessment Timed Up and Go Test     Timed Up and Go Test   TUG Normal TUG   Normal TUG (seconds) 22.69  20.22 no cane     Self-Care   Self-Care --                PT Education - 02/01/17 2016    Education provided Yes   Education Details Floor to stand transfers; discussed/recommended that walker for outdoor surfaces would be safer than cane   Person(s) Educated Patient   Methods Explanation;Demonstration   Comprehension Verbalized understanding;Returned demonstration          PT Short Term Goals - 01/07/17 1325      PT SHORT TERM GOAL #1   Title STGs= LTGs (4 weeks)           PT Long Term Goals - 02/01/17 2017      PT LONG TERM GOAL #1   Title Pt will perform HEP with family/aide supervision for improved balance and gait.  TARGET 02/06/17   Baseline No current HEP   Time 4   Period Weeks   Status New     PT LONG TERM GOAL #2   Title Pt will improve TUG score to less than or equal to 25 seconds for decreased fall risk.   Baseline TUG 30.25 sec; 22 sec TUG with cane 02/01/17   Time 4   Status Achieved     PT LONG TERM GOAL #3   Title Pt will improve Berg Balance score to at least 35/56 for decreased fall risk.   Baseline Berg 30/56   Time 4   Period Weeks   Status New     PT LONG TERM GOAL #4   Title Pt will improve gait velocity to at least 1.3 ft/sec for improved gait efficiency and safety in the home.    Baseline gait  velocity 1.06 ft/sec; 1.3 ft/sec-1.6 ft/sec 02/01/17   Time 4   Period Weeks   Status Partially Met     PT LONG TERM GOAL #5   Title Pt will verbalize understanding of fall prevention in the home environment.   Time 4   Period Weeks   Status New     PT LONG TERM GOAL #6   Title Pt will report improvement in ABC score by at least 20% for improved confidence in balance.   Baseline ABC score 23.8% at eval   Time 4   Period Weeks  Plan - 02/01/17 2019    Clinical Impression Statement Began checking LTGs, as this is patient's wk 4 of 4 in initial POC.  Pt has met LTG 2 for TUG score; LTG 4 partially met for improved gait velocity.  Pt remains at fall risk per both these scores.  Focused additional time this session on floor>stand transfer per plan from last visit as well as ramp and outdoor gait.  Pt feels she is improving and noted improvements in gait velocity and TUG scores, but she would like to continue therapy for additional balance and gait training.   PT Treatment/Interventions ADLs/Self Care Home Management;Functional mobility training;Gait training;DME Instruction;Therapeutic activities;Therapeutic exercise;Balance training;Neuromuscular re-education;Patient/family education   PT Next Visit Plan Check remaining LTGs (ABC scale score can wait, as it will be part of d/c FOTO); will plan to renew next week.  Gait training outdoor surfaces with RW.   Consulted and Agree with Plan of Care Patient      Patient will benefit from skilled therapeutic intervention in order to improve the following deficits and impairments:  Abnormal gait, Decreased balance, Decreased mobility, Decreased strength, Decreased knowledge of use of DME, Difficulty walking  Visit Diagnosis: Unsteadiness on feet  Other abnormalities of gait and mobility     Problem List Patient Active Problem List   Diagnosis Date Noted  . Senile purpura ([place]) 12/18/2016  . Immunization not carried out  because of parent refusal 12/18/2016  . Advance care planning 02/18/2016  . Fatigue 10/20/2015  . Unequal blood pressure in upper extremities 10/20/2015  . Choledocholithiasis 02/11/2015  . Constipation 01/01/2015  . Cervical radiculopathy 12/10/2014  . Left knee pain 08/24/2014  . Vitamin D insufficiency 08/24/2014  . Carpal tunnel syndrome 01/07/2014  . 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 Preventative health care 08/15/2013  . CKD (chronic kidney disease) stage 4, GFR 15-29 ml/min (HCC) 02/16/2012  . Osteoarthritis of hand 02/16/2012  . Long-term (current) use of anticoagulants 08/11/2010  . Type 2 diabetes mellitus ([place]) 12/12/2006  . Hyperlipidemia 12/12/2006  . Anemia of chronic disease 12/12/2006  . Hypertension associated with diabetes ([place]) 12/12/2006  . History of CVA (cerebrovascular accident) 09/19/2006    Frazier Butt. 02/01/2017, 8:23 PM  Frazier Butt., PT   [place] [address] [place], [place], [address] Phone: [phone]   Fax:  [phone]  Name: JESSA STINSON MRN: [id number] Date of Birth: 12-21-1927

## 2017-02-03 ENCOUNTER — Ambulatory Visit: Payer: Medicare Other | Admitting: Rehabilitation

## 2017-02-03 ENCOUNTER — Encounter: Payer: Self-pay | Admitting: Rehabilitation

## 2017-02-03 DIAGNOSIS — R2689 Other abnormalities of gait and mobility: Secondary | ICD-10-CM | POA: Diagnosis not present

## 2017-02-03 DIAGNOSIS — M6281 Muscle weakness (generalized): Secondary | ICD-10-CM | POA: Diagnosis not present

## 2017-02-03 DIAGNOSIS — R2681 Unsteadiness on feet: Secondary | ICD-10-CM | POA: Diagnosis not present

## 2017-02-03 NOTE — Therapy (Signed)
[facility] [address] [place], [place], [address] Phone: [phone]   Fax:  [phone]  Physical Therapy Treatment  Patient Details  Name: Chelsea Mcdaniel MRN: [id number] Date of Birth: [birth year] Referring Provider: Charlott Rakes, MD  Encounter Date: 02/03/2017      PT End of Session - 02/03/17 1853    Visit Number 8   Number of Visits 17  per updated POC   Date for PT Re-Evaluation 03/08/17   Authorization Type UHC Medicare/Medicaid secondary-GCODE   PT Start Time 3419   PT Stop Time 1400   PT Time Calculation (min) 43 min   Equipment Utilized During Treatment Gait belt   Activity Tolerance Patient tolerated treatment well   Behavior During Therapy [facility] for tasks assessed/performed      Past Medical History:  Diagnosis Date  . Anemia     normocytic anemia with baseline hemoglobin 10-11  . Blindness of left eye     likely related to stroke, left cataract removed from that eye with complications  . Calculus gallbladder and bile duct with cholecystitis with obstruction 01/2015  . Chronic kidney disease 2006    left renal artery stenosis with probable hemodynamic significance, kidneys are normal in morphology without focal lesions or hydronephrosis this is based but cannot A. of the abdomen with and without contrast done on the 31st 2006  . CKD (chronic kidney disease), stage III   . CVA (cerebral vascular accident) ([place]) 1990's  . CVA (cerebrovascular accident) [facility])  October 2007    CT of the head atrophy with multiple remote insults noted but no definite acute findings  . Glaucoma   . Gout   . Hyperlipidemia   . Hypertension   . Personal history of DVT (deep vein thrombosis) 08/11/2010   BLE  . Type II diabetes mellitus ([place])     Past Surgical History:  Procedure Laterality Date  . ABDOMINAL HYSTERECTOMY    . CATARACT EXTRACTION Left   . TRANSTHORACIC ECHOCARDIOGRAM   May 2008    left ventricular  systolic function normal EF estimated range of 55-60%, no definite diagnostic evidence of left ventricular regional wall motion abnormalities, Doppler parameters consistent with abnormal left ventricular relaxation, findings suggestive of possible bicuspid aortic valve and aortic valve thickness mildly increase    There were no vitals filed for this visit.      Subjective Assessment - 02/03/17 1321    Subjective Tried floor transfer at home, aide was there but they were not allowed to assist, therefore, she states that she hurt her arm somewhat trying to get up.     Pertinent History DM, chronic kidney disease   Patient Stated Goals Pt's goal for therapy is to get steadier on my feet.   Currently in Pain? Yes   Pain Score 3    Pain Location Arm   Pain Orientation Right   Pain Descriptors / Indicators Aching   Pain Type Acute pain   Pain Onset Yesterday   Pain Frequency Intermittent   Aggravating Factors  trying to raise overhead   Pain Relieving Factors rest                         [facility] Adult PT Treatment/Exercise - 02/03/17 1334      Ambulation/Gait   Ambulation/Gait Yes   Ambulation/Gait Assistance 5: Supervision;4: Min guard   Ambulation/Gait Assistance Details Pt presents again with yet another different cane that was adjusted during  session for appropriate height.  Pt ambulated x 115' including negotiating ramp/curb.  Still recommend S for use of cane in controlled environment but recommend RW for going into community.     Ambulation Distance (Feet) 115 Feet   Assistive device Straight cane   Gait Pattern Step-through pattern;Decreased stride length;Wide base of support;Poor foot clearance - left;Poor foot clearance - right   Ambulation Surface Level;Indoor   Ramp 5: Supervision;Other (comment)  min/guard    Ramp Details (indicate cue type and reason) Cues for safety with use of cane x 2 reps    Curb 4: Min assist   Curb Details (indicate cue type and  reason) Performed x 2 reps initially needing heavy min A as she lead with RLE vs during second rep required more min/guard to light min A when leading with LLE.       Standardized Balance Assessment   Standardized Balance Assessment Berg Balance Test     Berg Balance Test   Sit to Stand Able to stand  independently using hands   Standing Unsupported Able to stand safely 2 minutes   Sitting with Back Unsupported but Feet Supported on Floor or Stool Able to sit safely and securely 2 minutes   Stand to Sit Sits safely with minimal use of hands   Transfers Able to transfer safely, minor use of hands   Standing Unsupported with Eyes Closed Able to stand 10 seconds with supervision   Standing Ubsupported with Feet Together Able to place feet together independently and stand 1 minute safely   From Standing, Reach Forward with Outstretched Arm Can reach forward >12 cm safely (5")   From Standing Position, Pick up Object from Floor Able to pick up shoe, needs supervision   From Standing Position, Turn to Look Behind Over each Shoulder Looks behind from both sides and weight shifts well   Turn 360 Degrees Able to turn 360 degrees safely but slowly   Standing Unsupported, Alternately Place Feet on Step/Stool Able to complete >2 steps/needs minimal assist   Standing Unsupported, One Foot in Front Able to take small step independently and hold 30 seconds   Standing on One Leg Tries to lift leg/unable to hold 3 seconds but remains standing independently  on LLE, unable to do on RLE   Total Score 42     Self-Care   Self-Care Other Self-Care Comments   Other Self-Care Comments  Went over fall prevention strategies for home.  Pt able to name a few things that she does to prevent falls, however requires mod cues for many items as well as using cane to get to restroom at night.  Also went over safety with performing floor transfer.  Recommend she only do when someone that can assist is there otherwise  recommend she do not attempt.  Also went over having daughter in law calling Medicare to question Life alert coverage.   Discussed renewing pt for another 4 weeks to continue to work on balance.  Discussed this with daugher in law to get appts scheduled and also recommendations on using RW in community.                 PT Education - 02/03/17 1852    Education provided Yes   Education Details see self care   Person(s) Educated Patient;Caregiver(s)   Methods Explanation   Comprehension Verbalized understanding          PT Short Term Goals - 01/07/17 1325      PT  SHORT TERM GOAL #1   Title STGs= LTGs (4 weeks)           PT Long Term Goals - 02/03/17 1326      PT LONG TERM GOAL #1   Title Pt will perform HEP with family/aide supervision for improved balance and gait.  TARGET 02/06/17   Baseline Pt doing OTAGO HEP with aide/family every other day. 02/03/17   Time 4   Period Weeks   Status Achieved     PT LONG TERM GOAL #2   Title Pt will improve TUG score to less than or equal to 25 seconds for decreased fall risk.   Baseline TUG 30.25 sec; 22 sec TUG with cane 02/01/17   Time 4   Status Achieved     PT LONG TERM GOAL #3   Title Pt will improve Berg Balance score to at least 35/56 for decreased fall risk.   Baseline Berg 30/56 at baseline, 42/56 on 02/03/17   Time 4   Period Weeks   Status Achieved     PT LONG TERM GOAL #4   Title Pt will improve gait velocity to at least 1.3 ft/sec for improved gait efficiency and safety in the home.    Baseline gait velocity 1.06 ft/sec; 1.3 ft/sec-1.6 ft/sec 02/01/17   Time 4   Period Weeks   Status Partially Met     PT LONG TERM GOAL #5   Title Pt will verbalize understanding of fall prevention in the home environment.   Baseline Pt needs min cues to recall fall prevention.    Time 4   Period Weeks   Status Partially Met     PT LONG TERM GOAL #6   Title Pt will report improvement in ABC score by at least 20% for  improved confidence in balance.   Baseline ABC score 23.8% at eval   Time 4   Period Weeks   Status On-going               Plan - 02/03/17 1859    Clinical Impression Statement Pt has met 3/6 LTGs and would benefit from continued therapy, therefore will plan to renew for 4 more weeks at 2x/wk.  Discussed with daughter in law and both pt and her verbalized understanding and agreement.     PT Frequency 2x / week   PT Duration 4 weeks   PT Treatment/Interventions ADLs/Self Care Home Management;Functional mobility training;Gait training;DME Instruction;Therapeutic activities;Therapeutic exercise;Balance training;Neuromuscular re-education;Patient/family education   PT Next Visit Plan Gait training outdoor surfaces with RW, balance, go over HEP as needed   Consulted and Agree with Plan of Care Patient      Patient will benefit from skilled therapeutic intervention in order to improve the following deficits and impairments:  Abnormal gait, Decreased balance, Decreased mobility, Decreased strength, Decreased knowledge of use of DME, Difficulty walking  Visit Diagnosis: Unsteadiness on feet  Other abnormalities of gait and mobility  Muscle weakness (generalized)     Problem List Patient Active Problem List   Diagnosis Date Noted  . Senile purpura ([place]) 12/18/2016  . Immunization not carried out because of parent refusal 12/18/2016  . Advance care planning 02/18/2016  . Fatigue 10/20/2015  . Unequal blood pressure in upper extremities 10/20/2015  . Choledocholithiasis 02/11/2015  . Constipation 01/01/2015  . Cervical radiculopathy 12/10/2014  . Left knee pain 08/24/2014  . Vitamin D insufficiency 08/24/2014  . Carpal tunnel syndrome 01/07/2014  . 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  Preventative health care 08/15/2013  . CKD (chronic kidney disease) stage 4, GFR 15-29 ml/min (HCC) 02/16/2012  . Osteoarthritis of hand 02/16/2012  . Long-term  (current) use of anticoagulants 08/11/2010  . Type 2 diabetes mellitus ([place]) 12/12/2006  . Hyperlipidemia 12/12/2006  . Anemia of chronic disease 12/12/2006  . Hypertension associated with diabetes ([place]) 12/12/2006  . History of CVA (cerebrovascular accident) 09/19/2006    Cameron Sprang, PT, MPT [facility] [address] [place], [place], [address] Phone: [phone]   Fax:  [phone] 02/03/17, 7:02 PM  Name: Chelsea Mcdaniel MRN: [id number] Date of Birth: [birth year]

## 2017-02-08 ENCOUNTER — Ambulatory Visit: Payer: Medicare Other | Admitting: Physical Therapy

## 2017-02-08 DIAGNOSIS — M6281 Muscle weakness (generalized): Secondary | ICD-10-CM | POA: Diagnosis not present

## 2017-02-08 DIAGNOSIS — R2689 Other abnormalities of gait and mobility: Secondary | ICD-10-CM

## 2017-02-08 DIAGNOSIS — R2681 Unsteadiness on feet: Secondary | ICD-10-CM

## 2017-02-09 NOTE — Therapy (Signed)
[facility] [address] [place], [place], [address] Phone: [phone]   Fax:  [phone]  Physical Therapy Treatment  Patient Details  Name: Chelsea Mcdaniel MRN: [id number] Date of Birth: 12-01-1927 Referring Provider: Charlott Rakes, MD  Encounter Date: 02/08/2017      PT End of Session - 02/09/17 1742    Visit Number 9   Number of Visits 17  per updated POC   Date for PT Re-Evaluation 03/08/17   Authorization Type UHC Medicare/Medicaid secondary-GCODE   PT Start Time 1147   PT Stop Time 1230   PT Time Calculation (min) 43 min   Equipment Utilized During Treatment Gait belt   Activity Tolerance Patient tolerated treatment well   Behavior During Therapy [facility] for tasks assessed/performed      Past Medical History:  Diagnosis Date  . Anemia     normocytic anemia with baseline hemoglobin 10-11  . Blindness of left eye     likely related to stroke, left cataract removed from that eye with complications  . Calculus gallbladder and bile duct with cholecystitis with obstruction 01/2015  . Chronic kidney disease 2006    left renal artery stenosis with probable hemodynamic significance, kidneys are normal in morphology without focal lesions or hydronephrosis this is based but cannot A. of the abdomen with and without contrast done on the 31st 2006  . CKD (chronic kidney disease), stage III   . CVA (cerebral vascular accident) ([place]) 1990's  . CVA (cerebrovascular accident) [facility])  October 2007    CT of the head atrophy with multiple remote insults noted but no definite acute findings  . Glaucoma   . Gout   . Hyperlipidemia   . Hypertension   . Personal history of DVT (deep vein thrombosis) 08/11/2010   BLE  . Type II diabetes mellitus ([place])     Past Surgical History:  Procedure Laterality Date  . ABDOMINAL HYSTERECTOMY    . CATARACT EXTRACTION Left   . TRANSTHORACIC ECHOCARDIOGRAM   May 2008    left ventricular  systolic function normal EF estimated range of 55-60%, no definite diagnostic evidence of left ventricular regional wall motion abnormalities, Doppler parameters consistent with abnormal left ventricular relaxation, findings suggestive of possible bicuspid aortic valve and aortic valve thickness mildly increase    There were no vitals filed for this visit.      Subjective Assessment - 02/08/17 1151    Subjective Nothing new that I know of.  No falls.   Pertinent History DM, chronic kidney disease   Patient Stated Goals Pt's goal for therapy is to get steadier on my feet.   Currently in Pain? No/denies   Pain Onset --                         [facility] Adult PT Treatment/Exercise - 02/08/17 1153      Ambulation/Gait   Ambulation/Gait Yes   Ambulation/Gait Assistance 5: Supervision;4: Min guard   Ambulation Distance (Feet) 200 Feet  outdoors, then 200 ft indoors   Assistive device Straight cane;Rolling walker   Gait Pattern Step-through pattern;Decreased stride length;Wide base of support;Poor foot clearance - left;Poor foot clearance - right   Ambulation Surface Level;Indoor;Outdoor;Paved  sidewalk   Ramp 5: Supervision;Other (comment)   Ramp Details (indicate cue type and reason) min guard assistance  x 4 outside, cues for foot clearance   Gait Comments Outdoor gait with RW; pt needs min guard and frequent cues  to stay close within BOS and for foot clearance.             Balance Exercises - 02/09/17 1738      Balance Exercises: Standing   Rockerboard Anterior/posterior;EO;UE support;10 reps  Head turns, head nods x 5   Balance Beam Marching in place x 10, alternating forward kicks x10, then alternating step taps x 10 reps with UE support; standing on beam with EO head turns x 5, head nods x 5     Alternating step taps to 6" step x 10 reps each leg with UE support; forward step ups x 5 reps each leg, with bilateral UE support.         PT Short Term Goals  - 01/07/17 1325      PT SHORT TERM GOAL #1   Title STGs= LTGs (4 weeks)           PT Long Term Goals - 02/03/17 1326      PT LONG TERM GOAL #1   Title Pt will perform HEP with family/aide supervision for improved balance and gait.  UPDATED TARGET DATE for ALL LTGS: 03/05/17)   Baseline Pt doing OTAGO HEP with aide/family every other day. 02/03/17   Time 4   Period Weeks   Status Achieved     PT LONG TERM GOAL #2   Title Pt will improve TUG score to less than or equal to 18 seconds for decreased fall risk.   Baseline TUG 30.25 sec; 22 sec TUG with cane 02/01/17   Time 4   Status Revised     PT LONG TERM GOAL #3   Title Pt will improve Berg Balance score to at least 46/56 for decreased fall risk.   Baseline Berg 30/56 at baseline, 42/56 on 02/03/17   Time 4   Period Weeks   Status Revised     PT LONG TERM GOAL #4   Title Pt will improve gait velocity to at least 1.8 ft/sec w/ LRAD for improved gait efficiency and safety in the home.    Baseline gait velocity 1.06 ft/sec; 1.3 ft/sec-1.6 ft/sec 02/01/17   Time 4   Period Weeks   Status Revised     PT LONG TERM GOAL #5   Title Pt will verbalize understanding of fall prevention in the home environment.   Baseline Pt needs min cues to recall fall prevention.    Time 4   Period Weeks   Status On-going     PT LONG TERM GOAL #6   Title Pt will report improvement in ABC score by at least 20% for improved confidence in balance.   Baseline ABC score 23.8% at eval   Time 4   Period Weeks   Status On-going               Plan - 02/09/17 1743    Clinical Impression Statement Pt tolerates PT session well.  Short gait activities performed outside, using RW (short distance due to heat).  Pt needs frequent cues to stay within BOS of walker for improved safety and stability.  Pt will continue to benefit from skilled PT to address balance and gait.   PT Frequency 2x / week   PT Duration 4 weeks   PT Treatment/Interventions  ADLs/Self Care Home Management;Functional mobility training;Gait training;DME Instruction;Therapeutic activities;Therapeutic exercise;Balance training;Neuromuscular re-education;Patient/family education   PT Next Visit Plan balance and gait activities   Consulted and Agree with Plan of Care Patient    PLAN:  GCODE next  visit  Patient will benefit from skilled therapeutic intervention in order to improve the following deficits and impairments:  Abnormal gait, Decreased balance, Decreased mobility, Decreased strength, Decreased knowledge of use of DME, Difficulty walking  Visit Diagnosis: Other abnormalities of gait and mobility  Unsteadiness on feet     Problem List Patient Active Problem List   Diagnosis Date Noted  . Senile purpura ([place]) 12/18/2016  . Immunization not carried out because of parent refusal 12/18/2016  . Advance care planning 02/18/2016  . Fatigue 10/20/2015  . Unequal blood pressure in upper extremities 10/20/2015  . Choledocholithiasis 02/11/2015  . Constipation 01/01/2015  . Cervical radiculopathy 12/10/2014  . Left knee pain 08/24/2014  . Vitamin D insufficiency 08/24/2014  . Carpal tunnel syndrome 01/07/2014  . 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 Preventative health care 08/15/2013  . CKD (chronic kidney disease) stage 4, GFR 15-29 ml/min (HCC) 02/16/2012  . Osteoarthritis of hand 02/16/2012  . Long-term (current) use of anticoagulants 08/11/2010  . Type 2 diabetes mellitus ([place]) 12/12/2006  . Hyperlipidemia 12/12/2006  . Anemia of chronic disease 12/12/2006  . Hypertension associated with diabetes ([place]) 12/12/2006  . History of CVA (cerebrovascular accident) 09/19/2006    Frazier Butt. 02/09/2017, 5:52 PM  Frazier Butt., PT   [facility] [address] [place], [place], [address] Phone: [phone]   Fax:  [phone]  Name: MORGAINE KIMBALL MRN:  [id number] Date of Birth: 1927/11/07

## 2017-02-10 ENCOUNTER — Encounter: Payer: Self-pay | Admitting: Rehabilitation

## 2017-02-10 ENCOUNTER — Ambulatory Visit: Payer: Medicare Other | Admitting: Rehabilitation

## 2017-02-10 DIAGNOSIS — R2681 Unsteadiness on feet: Secondary | ICD-10-CM | POA: Diagnosis not present

## 2017-02-10 DIAGNOSIS — M6281 Muscle weakness (generalized): Secondary | ICD-10-CM | POA: Diagnosis not present

## 2017-02-10 DIAGNOSIS — R2689 Other abnormalities of gait and mobility: Secondary | ICD-10-CM

## 2017-02-10 NOTE — Therapy (Signed)
[facility] [address] [place], [place], [address] Phone: [phone]   Fax:  [phone]  Physical Therapy Treatment   Patient Details  Name: Chelsea Mcdaniel MRN: [id number] Date of Birth: 1927-08-21 Referring Provider: Charlott Rakes, MD  Encounter Date: 02/10/2017      PT End of Session - 02/10/17 1204    Visit Number 10   Number of Visits 17  per updated POC   Date for PT Re-Evaluation 03/08/17   Authorization Type UHC Medicare/Medicaid secondary-GCODE   PT Start Time 1106   PT Stop Time 1148   PT Time Calculation (min) 42 min   Equipment Utilized During Treatment Gait belt   Activity Tolerance Patient tolerated treatment well   Behavior During Therapy WFL for tasks assessed/performed      Past Medical History:  Diagnosis Date  . Anemia     normocytic anemia with baseline hemoglobin 10-11  . Blindness of left eye     likely related to stroke, left cataract removed from that eye with complications  . Calculus gallbladder and bile duct with cholecystitis with obstruction 01/2015  . Chronic kidney disease 2006    left renal artery stenosis with probable hemodynamic significance, kidneys are normal in morphology without focal lesions or hydronephrosis this is based but cannot A. of the abdomen with and without contrast done on the 31st 2006  . CKD (chronic kidney disease), stage III   . CVA (cerebral vascular accident) ([place]) 1990's  . CVA (cerebrovascular accident) [facility])  October 2007    CT of the head atrophy with multiple remote insults noted but no definite acute findings  . Glaucoma   . Gout   . Hyperlipidemia   . Hypertension   . Personal history of DVT (deep vein thrombosis) 08/11/2010   BLE  . Type II diabetes mellitus ([place])     Past Surgical History:  Procedure Laterality Date  . ABDOMINAL HYSTERECTOMY    . CATARACT EXTRACTION Left   . TRANSTHORACIC ECHOCARDIOGRAM   May 2008    left ventricular  systolic function normal EF estimated range of 55-60%, no definite diagnostic evidence of left ventricular regional wall motion abnormalities, Doppler parameters consistent with abnormal left ventricular relaxation, findings suggestive of possible bicuspid aortic valve and aortic valve thickness mildly increase    There were no vitals filed for this visit.      Subjective Assessment - 02/10/17 1156    Subjective Pt reports no changes, no falls.    Pertinent History DM, chronic kidney disease   Patient Stated Goals Pt's goal for therapy is to get steadier on my feet.   Currently in Pain? No/denies                         [facility] Adult PT Treatment/Exercise - 02/10/17 0001      Ambulation/Gait   Ambulation/Gait Yes   Ambulation/Gait Assistance 5: Supervision   Ambulation/Gait Assistance Details Note pt using cane in L hand upon ambulating into clinic.  When questioned, pt did state that she normally uses cane in R hand.  When in L hand, she tends to just carry cane placing her at higher fall risk.  Utilized cane in R hand remainder of session and walking out with marked improvement in balance noted.     Ambulation Distance (Feet) 85 Feet  x2    Assistive device Straight cane   Gait Pattern Step-through pattern;Decreased stride length;Wide base of support;Poor foot  clearance - left;Poor foot clearance - right   Ambulation Surface Level;Indoor             Balance Exercises - 02/10/17 1159      OTAGO PROGRAM   Head Movements Sitting;5 reps   Neck Movements 5 reps;Sitting   Trunk Movements Standing;5 reps   Hip ABductor 10 reps   Ankle Plantorflexors 20 reps, support  15 reps   Ankle Dorsiflexors 20 reps, support  15 reps   Knee Bends 10 reps, support   Backwards Walking Support  x 2 laps   Sideways Walking Assistive device  x 2 reps along counter   Tandem Stance 10 seconds, support  x 3 reps on each side   Tandem Walk Support  x2 laps   One Leg Stand 10  seconds, support  x 3 reps on each side   Heel Walking Support  x 2 laps    Sit to Stand 10 reps, no support  cues to not lock LLE into extension   Overall OTAGO Comments Went over HEP today with modifications made to most standing exercises to use single UE when able to increase balance challenge.  Pt tolerated well.             PT Education - 02/10/17 1203    Education provided Yes   Education Details modifications made to [place], see her handout   Person(s) Educated Patient   Methods Explanation;Handout   Comprehension Verbalized understanding;Returned demonstration          PT Short Term Goals - 01/07/17 1325      PT SHORT TERM GOAL #1   Title STGs= LTGs (4 weeks)           PT Long Term Goals - 02/03/17 1326      PT LONG TERM GOAL #1   Title Pt will perform HEP with family/aide supervision for improved balance and gait.  UPDATED TARGET DATE for ALL LTGS: 03/05/17)   Baseline Pt doing OTAGO HEP with aide/family every other day. 02/03/17   Time 4   Period Weeks   Status Achieved     PT LONG TERM GOAL #2   Title Pt will improve TUG score to less than or equal to 18 seconds for decreased fall risk.   Baseline TUG 30.25 sec; 22 sec TUG with cane 02/01/17   Time 4   Status Revised     PT LONG TERM GOAL #3   Title Pt will improve Berg Balance score to at least 46/56 for decreased fall risk.   Baseline Berg 30/56 at baseline, 42/56 on 02/03/17   Time 4   Period Weeks   Status Revised     PT LONG TERM GOAL #4   Title Pt will improve gait velocity to at least 1.8 ft/sec w/ LRAD for improved gait efficiency and safety in the home.    Baseline gait velocity 1.06 ft/sec; 1.3 ft/sec-1.6 ft/sec 02/01/17   Time 4   Period Weeks   Status Revised     PT LONG TERM GOAL #5   Title Pt will verbalize understanding of fall prevention in the home environment.   Baseline Pt needs min cues to recall fall prevention.    Time 4   Period Weeks   Status On-going     PT LONG TERM  GOAL #6   Title Pt will report improvement in ABC score by at least 20% for improved confidence in balance.   Baseline ABC score 23.8% at eval  Time 4   Period Weeks   Status On-going               Plan - 03/12/17 1204    Clinical Impression Statement Skilled session went over [place] in order to work on increasing balance challenge with single UE support vs double.  Pt tolerated very well and made notes within her handout/booklet.     PT Frequency 2x / week   PT Duration 4 weeks   PT Treatment/Interventions ADLs/Self Care Home Management;Functional mobility training;Gait training;DME Instruction;Therapeutic activities;Therapeutic exercise;Balance training;Neuromuscular re-education;Patient/family education   PT Next Visit Plan balance and gait activities, continue gait outdoors with RW    Consulted and Agree with Plan of Care Patient      Patient will benefit from skilled therapeutic intervention in order to improve the following deficits and impairments:  Abnormal gait, Decreased balance, Decreased mobility, Decreased strength, Decreased knowledge of use of DME, Difficulty walking  Visit Diagnosis: Other abnormalities of gait and mobility  Unsteadiness on feet  Muscle weakness (generalized)       G-Codes - 03-12-17 1205    Functional Assessment Tool Used (Outpatient Only) BERG: 42/56, TUG 22.69 without device    Functional Limitation Mobility: Walking and moving around   Mobility: Walking and Moving Around Current Status [phone]) At least 20 percent but less than 40 percent impaired, limited or restricted   Mobility: Walking and Moving Around Goal Status [phone]) At least 20 percent but less than 40 percent impaired, limited or restricted      Physical Therapy Progress Note  Dates of Reporting Period: 01/07/17 to 12-Mar-2017  Objective Reports of Subjective Statement: See above  Objective Measurements: BERG 42/56, TUG 22.69 secs without device   Goal Update: See  goals above  Plan: Continue current POC  Reason Skilled Services are Required: Pt continues to demonstrate balance and generalized strength deficits, but making steady gains towards LTGs.       Problem List Patient Active Problem List   Diagnosis Date Noted  . Senile purpura ([place]) 12/18/2016  . Immunization not carried out because of parent refusal 12/18/2016  . Advance care planning 02/18/2016  . Fatigue 10/20/2015  . Unequal blood pressure in upper extremities 10/20/2015  . Choledocholithiasis 2015-03-13  . Constipation 01/01/2015  . Cervical radiculopathy 12/10/2014  . Left knee pain 08/24/2014  . Vitamin D insufficiency 08/24/2014  . Carpal tunnel syndrome 01/07/2014  . 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 Preventative health care 08/15/2013  . CKD (chronic kidney disease) stage 4, GFR 15-29 ml/min (HCC) 02/16/2012  . Osteoarthritis of hand 02/16/2012  . Long-term (current) use of anticoagulants 08/11/2010  . Type 2 diabetes mellitus ([place]) 12/12/2006  . Hyperlipidemia 12/12/2006  . Anemia of chronic disease 12/12/2006  . Hypertension associated with diabetes ([place]) 12/12/2006  . History of CVA (cerebrovascular accident) 09/19/2006    Cameron Sprang, PT, MPT [facility] [address] [place], [place], [address] Phone: [phone]   Fax:  [phone] March 12, 2017, 12:07 PM  Name: Chelsea Mcdaniel MRN: [id number] Date of Birth: Jul 04, 1928

## 2017-02-14 ENCOUNTER — Ambulatory Visit (INDEPENDENT_AMBULATORY_CARE_PROVIDER_SITE_OTHER): Payer: Medicare Other | Admitting: Pharmacist

## 2017-02-14 ENCOUNTER — Ambulatory Visit: Payer: Medicare Other | Admitting: Rehabilitation

## 2017-02-14 ENCOUNTER — Encounter: Payer: Self-pay | Admitting: Rehabilitation

## 2017-02-14 DIAGNOSIS — H338 Other retinal detachments: Secondary | ICD-10-CM | POA: Diagnosis not present

## 2017-02-14 DIAGNOSIS — R2689 Other abnormalities of gait and mobility: Secondary | ICD-10-CM | POA: Diagnosis not present

## 2017-02-14 DIAGNOSIS — M6281 Muscle weakness (generalized): Secondary | ICD-10-CM | POA: Diagnosis not present

## 2017-02-14 DIAGNOSIS — H2702 Aphakia, left eye: Secondary | ICD-10-CM | POA: Diagnosis not present

## 2017-02-14 DIAGNOSIS — Z7901 Long term (current) use of anticoagulants: Secondary | ICD-10-CM | POA: Diagnosis not present

## 2017-02-14 DIAGNOSIS — R2681 Unsteadiness on feet: Secondary | ICD-10-CM

## 2017-02-14 DIAGNOSIS — Z8673 Personal history of transient ischemic attack (TIA), and cerebral infarction without residual deficits: Secondary | ICD-10-CM

## 2017-02-14 DIAGNOSIS — H2511 Age-related nuclear cataract, right eye: Secondary | ICD-10-CM | POA: Diagnosis not present

## 2017-02-14 DIAGNOSIS — H401113 Primary open-angle glaucoma, right eye, severe stage: Secondary | ICD-10-CM | POA: Diagnosis not present

## 2017-02-14 LAB — HM DIABETES EYE EXAM

## 2017-02-14 LAB — POCT INR: INR: 2.7

## 2017-02-14 NOTE — Progress Notes (Signed)
Anticoagulation Management Chelsea Mcdaniel is a 83 y.o. female who reports to the clinic for monitoring of warfarin treatment.    Indication: CVA , history of; long-term use of anticoagulation.  Duration: indefinite Supervising physician: [place] Visit History: Patient does not report signs/symptoms of bleeding or thromboembolism  Other recent changes: No diet, medications, lifestyle endorsed by the patient.  Anticoagulation Episode Summary    Current INR goal:   2.0-3.0  TTR:   64.9 % (3.1 y)  Next INR check:   03/14/2017  INR from last check:   2.70 (02/14/2017)  Weekly max warfarin dose:     Target end date:     INR check location:     Preferred lab:     Send INR reminders to:      Indications   EMBOLISM/THROMBOSIS DEEP VSL LWR EXTRM NOS (Resolved) [I82.409] Long-term (current) use of anticoagulants [Z79.01]       Comments:          ASSESSMENT Recent Results: The most recent result is correlated with 35 mg per week: Lab Results  Component Value Date   INR 2.70 02/14/2017   INR 2.60 01/17/2017   INR 2.80 12/27/2016    Anticoagulation Dosing: INR as of 02/14/2017 and Previous Warfarin Dosing Information    INR Dt INR Goal Wkly Tot Sun Mon Tue Wed Thu Fri Sat   02/14/2017 2.70 2.0-3.0 35 mg 5 mg 5 mg 5 mg 5 mg 5 mg 5 mg 5 mg    Previous description   Take 1 tablet by mouth once-daily at 6PM on all days of the week.    Anticoagulation Warfarin Dose Instructions as of 02/14/2017      Total Sun Mon Tue Wed Thu Fri Sat   New Dose 32.5 mg 5 mg 5 mg 2.5 mg 5 mg 5 mg 5 mg 5 mg     (5 mg x 1)  (5 mg x 1)  (5 mg x 0.5)  (5 mg x 1)  (5 mg x 1)  (5 mg x 1)  (5 mg x 1)                         Description   Take 1 tablet by mouth once-daily at [facility] on all days of the week--EXCEPT on TUESDAYS. On Tuesdays--give only 1/2 tablet of your 5mg  peach-colored warfarin tablets.       INR today: Therapeutic  PLAN Weekly dose was decreased by 7% to  32.5 mg per week  Patient Instructions  Patient instructed to take medications as defined in the Anti-coagulation Track section of this encounter.  Patient instructed to take today's dose.  Patient was instructed to take  1 tablet by mouth once-daily at [facility] on all days of the week--EXCEPT on TUESDAYS. On Tuesdays--give only 1/2 tablet of your 5mg  peach-colored warfarin tablets Patient verbalized understanding of these instructions.     Patient advised to contact clinic or seek medical attention if signs/symptoms of bleeding or thromboembolism occur.  Patient verbalized understanding by repeating back information and was advised to contact me if further medication-related questions arise. Patient was also provided an information handout.  Follow-up Return in 4 weeks (on 03/14/2017) for Follow up INR at 2:30PM.  Chelsea Mcdaniel, Chelsea Mcdaniel, PharmD, CACP, CPP  15 minutes spent face-to-face with the patient during the encounter. 50% of time spent on education. 50% of time was spent on fingerstick point of care INR specimen  collection, processing, resulting, interpretation of results, discussion with the patient and data entry in to EPIC/CHL and [email or web address] .

## 2017-02-14 NOTE — Therapy (Signed)
[facility] [address] [place], [place], [address] Phone: [phone]   Fax:  [phone]  Physical Therapy Treatment  Patient Details  Name: Chelsea Mcdaniel MRN: [id number] Date of Birth: 1928/03/03 Referring Provider: Charlott Rakes, MD  Encounter Date: 02/14/2017      PT End of Session - 02/14/17 1056    Visit Number 11   Number of Visits 17  per updated POC   Date for PT Re-Evaluation 03/08/17   Authorization Type UHC Medicare/Medicaid secondary-GCODE   PT Start Time 1017   PT Stop Time 1100   PT Time Calculation (min) 43 min   Equipment Utilized During Treatment Gait belt   Activity Tolerance Patient tolerated treatment well   Behavior During Therapy WFL for tasks assessed/performed      Past Medical History:  Diagnosis Date  . Anemia     normocytic anemia with baseline hemoglobin 10-11  . Blindness of left eye     likely related to stroke, left cataract removed from that eye with complications  . Calculus gallbladder and bile duct with cholecystitis with obstruction 01/2015  . Chronic kidney disease 2006    left renal artery stenosis with probable hemodynamic significance, kidneys are normal in morphology without focal lesions or hydronephrosis this is based but cannot A. of the abdomen with and without contrast done on the 31st 2006  . CKD (chronic kidney disease), stage III   . CVA (cerebral vascular accident) ([place]) 1990's  . CVA (cerebrovascular accident) [facility])  October 2007    CT of the head atrophy with multiple remote insults noted but no definite acute findings  . Glaucoma   . Gout   . Hyperlipidemia   . Hypertension   . Personal history of DVT (deep vein thrombosis) 08/11/2010   BLE  . Type II diabetes mellitus ([place])     Past Surgical History:  Procedure Laterality Date  . ABDOMINAL HYSTERECTOMY    . CATARACT EXTRACTION Left   . TRANSTHORACIC ECHOCARDIOGRAM   May 2008    left ventricular  systolic function normal EF estimated range of 55-60%, no definite diagnostic evidence of left ventricular regional wall motion abnormalities, Doppler parameters consistent with abnormal left ventricular relaxation, findings suggestive of possible bicuspid aortic valve and aortic valve thickness mildly increase    There were no vitals filed for this visit.      Subjective Assessment - 02/14/17 1024    Subjective Reports feeling well, no changes.    Pertinent History DM, chronic kidney disease   Patient Stated Goals Pt's goal for therapy is to get steadier on my feet.   Currently in Pain? No/denies                         [facility] Adult PT Treatment/Exercise - 02/14/17 0001      Ambulation/Gait   Ambulation/Gait Yes   Ambulation/Gait Assistance 5: Supervision;4: Min guard   Ambulation/Gait Assistance Details Continue to assess pts gait over paved unlevel outdoor surfaces.  Note pt able to ambulate up to 500' (both indoor and outdoor combined) with RW at S to min/guard level.  Note that she requires cues for upright posture (closeness to RW) and also for safety when crossing uneven parts of pavement.  Overall note marked improvement in gait speed when using RW.  Educated on using RW in community for improved safety.     Ambulation Distance (Feet) 500 Feet   Assistive device Rolling walker  Gait Pattern Step-through pattern;Decreased stride length;Wide base of support;Poor foot clearance - left;Poor foot clearance - right   Ambulation Surface Level;Unlevel;Indoor;Outdoor;Paved   Stairs Yes   Stairs Assistance 4: Min guard   Stairs Assistance Details (indicate cue type and reason) Min/guard for safety with cues to utilize cane as she tends to hold it in the air while performing stairs.     Stair Management Technique One rail Left;Step to pattern;Forwards;With cane   Number of Stairs 4   Height of Stairs 6   Ramp 5: Supervision  min/guard    Ramp Details (indicate cue type  and reason) S to min/guard for safety and clearance of small lip at bottom of ramp   Curb 5: Supervision  min/guard     Neuro Re-ed    Neuro Re-ed Details  Balance exercises in // bars; standing on foam airex pad maintaining balance x 3 reps of 20 secs, then with eyes closed x 3 reps of 5 secs.  Performed with no UE support with min/guard.  Performed cone taps in // bars with light UE support x 8 reps.  Note increased difficulty in clearing cone with RLE with cues for increased hip flex esp when descending from cone.                  PT Education - 02/14/17 1024    Education provided Yes   Education Details using RW in [employer]) Educated Patient   Methods Explanation   Comprehension Verbalized understanding          PT Short Term Goals - 01/07/17 1325      PT SHORT TERM GOAL #1   Title STGs= LTGs (4 weeks)           PT Long Term Goals - 02/03/17 1326      PT LONG TERM GOAL #1   Title Pt will perform HEP with family/aide supervision for improved balance and gait.  UPDATED TARGET DATE for ALL LTGS: 03/05/17)   Baseline Pt doing OTAGO HEP with aide/family every other day. 02/03/17   Time 4   Period Weeks   Status Achieved     PT LONG TERM GOAL #2   Title Pt will improve TUG score to less than or equal to 18 seconds for decreased fall risk.   Baseline TUG 30.25 sec; 22 sec TUG with cane 02/01/17   Time 4   Status Revised     PT LONG TERM GOAL #3   Title Pt will improve Berg Balance score to at least 46/56 for decreased fall risk.   Baseline Berg 30/56 at baseline, 42/56 on 02/03/17   Time 4   Period Weeks   Status Revised     PT LONG TERM GOAL #4   Title Pt will improve gait velocity to at least 1.8 ft/sec w/ LRAD for improved gait efficiency and safety in the home.    Baseline gait velocity 1.06 ft/sec; 1.3 ft/sec-1.6 ft/sec 02/01/17   Time 4   Period Weeks   Status Revised     PT LONG TERM GOAL #5   Title Pt will verbalize understanding of fall  prevention in the home environment.   Baseline Pt needs min cues to recall fall prevention.    Time 4   Period Weeks   Status On-going     PT LONG TERM GOAL #6   Title Pt will report improvement in ABC score by at least 20% for improved confidence in balance.  Baseline ABC score 23.8% at eval   Time 4   Period Weeks   Status On-going               Plan - 02/14/17 1139    Clinical Impression Statement Skilled session focused on gait outdoors with RW, negotiation of curb/ramp both outdoors and indoors with RW for improved technique and safety, negotiation of stairs with cane for improved safety, and balance to decrease fall risk.  Pt tolerated well and continue to recommend use of RW when in community.     PT Frequency 2x / week   PT Duration 4 weeks   PT Treatment/Interventions ADLs/Self Care Home Management;Functional mobility training;Gait training;DME Instruction;Therapeutic activities;Therapeutic exercise;Balance training;Neuromuscular re-education;Patient/family education   PT Next Visit Plan balance and gait activities, continue gait outdoors with RW    Consulted and Agree with Plan of Care Patient      Patient will benefit from skilled therapeutic intervention in order to improve the following deficits and impairments:  Abnormal gait, Decreased balance, Decreased mobility, Decreased strength, Decreased knowledge of use of DME, Difficulty walking  Visit Diagnosis: Other abnormalities of gait and mobility  Unsteadiness on feet  Muscle weakness (generalized)     Problem List Patient Active Problem List   Diagnosis Date Noted  . Senile purpura ([place]) 12/18/2016  . Immunization not carried out because of parent refusal 12/18/2016  . Advance care planning 02/18/2016  . Fatigue 10/20/2015  . Unequal blood pressure in upper extremities 10/20/2015  . Choledocholithiasis 02/11/2015  . Constipation 01/01/2015  . Cervical radiculopathy 12/10/2014  . Left knee pain  08/24/2014  . Vitamin D insufficiency 08/24/2014  . Carpal tunnel syndrome 01/07/2014  . 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 Preventative health care 08/15/2013  . CKD (chronic kidney disease) stage 4, GFR 15-29 ml/min (HCC) 02/16/2012  . Osteoarthritis of hand 02/16/2012  . Long-term (current) use of anticoagulants 08/11/2010  . Type 2 diabetes mellitus ([place]) 12/12/2006  . Hyperlipidemia 12/12/2006  . Anemia of chronic disease 12/12/2006  . Hypertension associated with diabetes ([place]) 12/12/2006  . History of CVA (cerebrovascular accident) 09/19/2006   Cameron Sprang, PT, MPT [facility] [address] [place], [place], [address] Phone: [phone]   Fax:  [phone] 02/14/17, 11:49 AM  Name: TRYNITI LAATSCH MRN: [id number] Date of Birth: 07-17-1928

## 2017-02-14 NOTE — Patient Instructions (Signed)
Patient instructed to take medications as defined in the Anti-coagulation Track section of this encounter.  Patient instructed to take today's dose.  Patient was instructed to take  1 tablet by mouth once-daily at [facility] on all days of the week--EXCEPT on TUESDAYS. On Tuesdays--give only 1/2 tablet of your 5mg  peach-colored warfarin tablets Patient verbalized understanding of these instructions.

## 2017-02-15 NOTE — Progress Notes (Signed)
I reviewed Dr. Gladstone Pih note.  Patient is on [facility] for CVA, long term.  INR on the high side, so medication decreased.

## 2017-02-22 ENCOUNTER — Encounter: Payer: Self-pay | Admitting: Physical Therapy

## 2017-02-22 ENCOUNTER — Ambulatory Visit: Payer: Medicare Other | Admitting: Physical Therapy

## 2017-02-22 DIAGNOSIS — R2681 Unsteadiness on feet: Secondary | ICD-10-CM

## 2017-02-22 DIAGNOSIS — M6281 Muscle weakness (generalized): Secondary | ICD-10-CM

## 2017-02-22 DIAGNOSIS — R2689 Other abnormalities of gait and mobility: Secondary | ICD-10-CM | POA: Diagnosis not present

## 2017-02-22 NOTE — Therapy (Signed)
[facility] [address] [place], [place], [address] Phone: [phone]   Fax:  [phone]  Physical Therapy Treatment  Patient Details  Name: Chelsea Mcdaniel MRN: [id number] Date of Birth: 1927-12-18 Referring Provider: Charlott Rakes, MD  Encounter Date: 02/22/2017      PT End of Session - 02/22/17 1323    Visit Number 12   Number of Visits 17  per updated POC   Date for PT Re-Evaluation 03/08/17   Authorization Type UHC Medicare/Medicaid secondary-GCODE   PT Start Time 1321  pt late for apt today   PT Stop Time 1400   PT Time Calculation (min) 39 min   Equipment Utilized During Treatment Gait belt   Activity Tolerance Patient tolerated treatment well   Behavior During Therapy WFL for tasks assessed/performed      Past Medical History:  Diagnosis Date  . Anemia     normocytic anemia with baseline hemoglobin 10-11  . Blindness of left eye     likely related to stroke, left cataract removed from that eye with complications  . Calculus gallbladder and bile duct with cholecystitis with obstruction 01/2015  . Chronic kidney disease 2006    left renal artery stenosis with probable hemodynamic significance, kidneys are normal in morphology without focal lesions or hydronephrosis this is based but cannot A. of the abdomen with and without contrast done on the 31st 2006  . CKD (chronic kidney disease), stage III   . CVA (cerebral vascular accident) ([place]) 1990's  . CVA (cerebrovascular accident) [facility])  October 2007    CT of the head atrophy with multiple remote insults noted but no definite acute findings  . Glaucoma   . Gout   . Hyperlipidemia   . Hypertension   . Personal history of DVT (deep vein thrombosis) 08/11/2010   BLE  . Type II diabetes mellitus ([place])     Past Surgical History:  Procedure Laterality Date  . ABDOMINAL HYSTERECTOMY    . CATARACT EXTRACTION Left   . TRANSTHORACIC ECHOCARDIOGRAM   May  2008    left ventricular systolic function normal EF estimated range of 55-60%, no definite diagnostic evidence of left ventricular regional wall motion abnormalities, Doppler parameters consistent with abnormal left ventricular relaxation, findings suggestive of possible bicuspid aortic valve and aortic valve thickness mildly increase    There were no vitals filed for this visit.      Subjective Assessment - 02/22/17 1323    Subjective No new complaints. No falls or pain to report.    Pertinent History DM, chronic kidney disease   Patient Stated Goals Pt's goal for therapy is to get steadier on my feet.   Currently in Pain? No/denies   Pain Score 0-No pain             [facility] Adult PT Treatment/Exercise - 02/22/17 1325      Transfers   Transfers Sit to Stand;Stand to Sit   Sit to Stand 6: Modified independent (Device/Increase time);With upper extremity assist;From bed;From chair/3-in-1;Multiple attempts   Stand to Sit 6: Modified independent (Device/Increase time);With upper extremity assist;To bed;To chair/3-in-1;Uncontrolled descent     Ambulation/Gait   Ambulation/Gait Yes   Ambulation/Gait Assistance 5: Supervision;4: Min guard   Ambulation/Gait Assistance Details cues needed for RW proximity with gait (pt tends to push RW too far forward), for negotiation of objects/borders of different surfaces/cracks in paved walkway. pt with tendency to veer toward left, running into grass, objects several times. Was able to  correct this with moderate cueing.    Ambulation Distance (Feet) 500 Feet  x1, 90 x2 all with RW   Assistive device Rolling walker   Gait Pattern Step-through pattern;Decreased stride length;Trunk flexed;Poor foot clearance - left;Poor foot clearance - right   Ambulation Surface Level;Unlevel;Indoor;Outdoor;Paved     High Level Balance   High Level Balance Activities Marching forwards;Marching backwards;Tandem walking   High Level Balance Comments in parallel bars  with intermittent touch to bars for balance: 3 laps each/each way with min guard to min assist for balance.  cues on posture, ex form and wieght shifting with each activity.             Balance Exercises - 02/22/17 1346      Balance Exercises: Standing   Balance Beam standing across blue foam beam: EC no head movements for 20 sec's x 3 reps, progressing to EO head movements left<>right and up<>down x 10 reps each. min guard to min assist for balance with intermittent UE touch to bars for balance. cues on posture, to increase base of support and for weight shifting for balance assistance.              PT Short Term Goals - 01/07/17 1325      PT SHORT TERM GOAL #1   Title STGs= LTGs (4 weeks)           PT Long Term Goals - 02/03/17 1326      PT LONG TERM GOAL #1   Title Pt will perform HEP with family/aide supervision for improved balance and gait.  UPDATED TARGET DATE for ALL LTGS: 03/05/17)   Baseline Pt doing OTAGO HEP with aide/family every other day. 02/03/17   Time 4   Period Weeks   Status Achieved     PT LONG TERM GOAL #2   Title Pt will improve TUG score to less than or equal to 18 seconds for decreased fall risk.   Baseline TUG 30.25 sec; 22 sec TUG with cane 02/01/17   Time 4   Status Revised     PT LONG TERM GOAL #3   Title Pt will improve Berg Balance score to at least 46/56 for decreased fall risk.   Baseline Berg 30/56 at baseline, 42/56 on 02/03/17   Time 4   Period Weeks   Status Revised     PT LONG TERM GOAL #4   Title Pt will improve gait velocity to at least 1.8 ft/sec w/ LRAD for improved gait efficiency and safety in the home.    Baseline gait velocity 1.06 ft/sec; 1.3 ft/sec-1.6 ft/sec 02/01/17   Time 4   Period Weeks   Status Revised     PT LONG TERM GOAL #5   Title Pt will verbalize understanding of fall prevention in the home environment.   Baseline Pt needs min cues to recall fall prevention.    Time 4   Period Weeks   Status  On-going     PT LONG TERM GOAL #6   Title Pt will report improvement in ABC score by at least 20% for improved confidence in balance.   Baseline ABC score 23.8% at eval   Time 4   Period Weeks   Status On-going            Plan - 02/22/17 1324    Clinical Impression Statement Today's skilled session continued to focus on gait wth RW on various surfaces and on high level balance activities. Pt is making progress toward goals  and should benefit from continued PT to progress toward unmet goals.                           PT Frequency 2x / week   PT Duration 4 weeks   PT Treatment/Interventions ADLs/Self Care Home Management;Functional mobility training;Gait training;DME Instruction;Therapeutic activities;Therapeutic exercise;Balance training;Neuromuscular re-education;Patient/family education   PT Next Visit Plan balance and gait activities, continue gait outdoors with RW    Consulted and Agree with Plan of Care Patient      Patient will benefit from skilled therapeutic intervention in order to improve the following deficits and impairments:  Abnormal gait, Decreased balance, Decreased mobility, Decreased strength, Decreased knowledge of use of DME, Difficulty walking  Visit Diagnosis: Other abnormalities of gait and mobility  Unsteadiness on feet  Muscle weakness (generalized)     Problem List Patient Active Problem List   Diagnosis Date Noted  . Senile purpura ([place]) 12/18/2016  . Immunization not carried out because of parent refusal 12/18/2016  . Advance care planning 02/18/2016  . Fatigue 10/20/2015  . Unequal blood pressure in upper extremities 10/20/2015  . Choledocholithiasis 02/11/2015  . Constipation 01/01/2015  . Cervical radiculopathy 12/10/2014  . Left knee pain 08/24/2014  . Vitamin D insufficiency 08/24/2014  . Carpal tunnel syndrome 01/07/2014  . 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 Preventative health care 08/15/2013  . CKD  (chronic kidney disease) stage 4, GFR 15-29 ml/min (HCC) 02/16/2012  . Osteoarthritis of hand 02/16/2012  . Long-term (current) use of anticoagulants 08/11/2010  . Type 2 diabetes mellitus ([place]) 12/12/2006  . Hyperlipidemia 12/12/2006  . Anemia of chronic disease 12/12/2006  . Hypertension associated with diabetes ([place]) 12/12/2006  . History of CVA (cerebrovascular accident) 09/19/2006    [name], PTA, [facility] [address] [place], [place] [address] [phone] 02/22/17, 11:20 PM   Name: EMILYROSE DARRAH MRN: [id number] Date of Birth: [birth year]

## 2017-02-25 ENCOUNTER — Ambulatory Visit: Payer: Medicare Other | Attending: Internal Medicine | Admitting: Physical Therapy

## 2017-02-25 ENCOUNTER — Encounter: Payer: Self-pay | Admitting: Physical Therapy

## 2017-02-25 DIAGNOSIS — R2689 Other abnormalities of gait and mobility: Secondary | ICD-10-CM | POA: Diagnosis not present

## 2017-02-25 DIAGNOSIS — R2681 Unsteadiness on feet: Secondary | ICD-10-CM | POA: Diagnosis not present

## 2017-02-25 DIAGNOSIS — M6281 Muscle weakness (generalized): Secondary | ICD-10-CM | POA: Diagnosis not present

## 2017-02-27 NOTE — Therapy (Signed)
[facility] [address] [place], [place], [address] Phone: [phone]   Fax:  [phone]  Physical Therapy Treatment  Patient Details  Name: Chelsea Mcdaniel MRN: [id number] Date of Birth: 12/24/1927 Referring Provider: Charlott Rakes, MD  Encounter Date: 02/25/2017   02/25/17 1240  PT Visits / Re-Eval  Visit Number 13  Number of Visits 17 (per updated POC)  Date for PT Re-Evaluation 03/08/17  Authorization  Authorization Type UHC Medicare/Medicaid secondary-GCODE  PT Time Calculation  PT Start Time 5364  PT Stop Time 1315  PT Time Calculation (min) 40 min  PT - End of Session  Equipment Utilized During Treatment Gait belt  Activity Tolerance Patient tolerated treatment well  Behavior During Therapy [facility] for tasks assessed/performed     Past Medical History:  Diagnosis Date  . Anemia     normocytic anemia with baseline hemoglobin 10-11  . Blindness of left eye     likely related to stroke, left cataract removed from that eye with complications  . Calculus gallbladder and bile duct with cholecystitis with obstruction 01/2015  . Chronic kidney disease 2006    left renal artery stenosis with probable hemodynamic significance, kidneys are normal in morphology without focal lesions or hydronephrosis this is based but cannot A. of the abdomen with and without contrast done on the 31st 2006  . CKD (chronic kidney disease), stage III   . CVA (cerebral vascular accident) ([place]) 1990's  . CVA (cerebrovascular accident) [facility])  October 2007    CT of the head atrophy with multiple remote insults noted but no definite acute findings  . Glaucoma   . Gout   . Hyperlipidemia   . Hypertension   . Personal history of DVT (deep vein thrombosis) 08/11/2010   BLE  . Type II diabetes mellitus ([place])     Past Surgical History:  Procedure Laterality Date  . ABDOMINAL HYSTERECTOMY    . CATARACT EXTRACTION Left   . TRANSTHORACIC  ECHOCARDIOGRAM   May 2008    left ventricular systolic function normal EF estimated range of 55-60%, no definite diagnostic evidence of left ventricular regional wall motion abnormalities, Doppler parameters consistent with abnormal left ventricular relaxation, findings suggestive of possible bicuspid aortic valve and aortic valve thickness mildly increase    There were no vitals filed for this visit.   02/25/17 1239  Symptoms/Limitations  Subjective No new complaints. No falls or pain to report. To clinic with her RW today. Pt has standard RW except the back legs have swivel wheels instead of post's. Needed assistance x2 with gait from lobby to gym to 1. steer RW and 2. keep all 4 wheels on ground as it tipped to side.   Pertinent History DM, chronic kidney disease  Patient Stated Goals Pt's goal for therapy is to get steadier on my feet.  Pain Assessment  Currently in Pain? No/denies  Pain Score 0       02/25/17 1241  Transfers  Transfers Sit to Stand;Stand to Sit  Sit to Stand 6: Modified independent (Device/Increase time);With upper extremity assist;From bed;From chair/3-in-1;Multiple attempts  Stand to Sit 6: Modified independent (Device/Increase time);With upper extremity assist;To bed;To chair/3-in-1;Uncontrolled descent  Ambulation/Gait  Ambulation/Gait Yes  Ambulation/Gait Assistance 5: Supervision;4: Min guard;4: Min assist  Ambulation/Gait Assistance Details min assist from lobby to gym due to swivel wheels on back of pt's walker/pt instability with gait; switched legs out to standard tip legs with tennis balls added to them. mostly supervision with one  episode of min guard assist due to toe scuffing, pt self correcting on RW. last gait lap involved numerous left/right turns and wearing around furniture, including negotiation tight spaces                         Ambulation Distance (Feet) 100 Feet (x1, 115 x1, 230 x1 )  Assistive device Rolling walker  Gait Pattern  Step-through pattern;Decreased stride length;Trunk flexed;Poor foot clearance - left;Poor foot clearance - right  Ambulation Surface Level;Indoor     02/25/17 1300  Balance Exercises: Standing  Standing Eyes Closed Narrow base of support (BOS);Head turns;Foam/compliant surface;Other reps (comment);20 secs;Limitations  SLS with Vectors Solid surface;Upper extremity assist 1;Other reps (comment);Limitations  Balance Exercises: Standing  Standing Eyes Closed Limitations on airex in corner with chair in front for safety: EC no head movements progressing to EC head movements left<>right, up<>down and diagonals both ways x 10 reps each. min<>mod assist for balance with cues on posture, ex form and no UE support.   SLS with Vectors Limitations 2 foam bubbles on floor: alternating forward toe taps x 10 each side with HHA, min<>mod assist for balance with cues to slow down, for weight shifting and posture/ex form.            PT Short Term Goals - 01/07/17 1325      PT SHORT TERM GOAL #1   Title STGs= LTGs (4 weeks)           PT Long Term Goals - 02/03/17 1326      PT LONG TERM GOAL #1   Title Pt will perform HEP with family/aide supervision for improved balance and gait.  UPDATED TARGET DATE for ALL LTGS: 03/05/17)   Baseline Pt doing OTAGO HEP with aide/family every other day. 02/03/17   Time 4   Period Weeks   Status Achieved     PT LONG TERM GOAL #2   Title Pt will improve TUG score to less than or equal to 18 seconds for decreased fall risk.   Baseline TUG 30.25 sec; 22 sec TUG with cane 02/01/17   Time 4   Status Revised     PT LONG TERM GOAL #3   Title Pt will improve Berg Balance score to at least 46/56 for decreased fall risk.   Baseline Berg 30/56 at baseline, 42/56 on 02/03/17   Time 4   Period Weeks   Status Revised     PT LONG TERM GOAL #4   Title Pt will improve gait velocity to at least 1.8 ft/sec w/ LRAD for improved gait efficiency and safety in the home.     Baseline gait velocity 1.06 ft/sec; 1.3 ft/sec-1.6 ft/sec 02/01/17   Time 4   Period Weeks   Status Revised     PT LONG TERM GOAL #5   Title Pt will verbalize understanding of fall prevention in the home environment.   Baseline Pt needs min cues to recall fall prevention.    Time 4   Period Weeks   Status On-going     PT LONG TERM GOAL #6   Title Pt will report improvement in ABC score by at least 20% for improved confidence in balance.   Baseline ABC score 23.8% at eval   Time 4   Period Weeks   Status On-going        02/25/17 1240  Plan  Clinical Impression Statement Today's silled sesion continued to focus on gait with RW using pt's  RW from home.and high level balance activities. Pt prented with swivel wheels on back of her RW with 5 inch wheels on front of her RW. Pt needed increaed assistance for stability with this RW set up. Swithced back wheels out for regular posts with tennis balls with increased stability noted with gait. Pt's personal RW is too high for her on it's lowest level. Pt reported it was donated to her. Pt will benefit from new RW at her hight for increased balance/stability with gait. Pt is progressing toward goals and should beneft from continued PT to progress toward unmet goals,                                      Pt will benefit from skilled therapeutic intervention in order to improve on the following deficits Abnormal gait;Decreased balance;Decreased mobility;Decreased strength;Decreased knowledge of use of DME;Difficulty walking  PT Frequency 2x / week  PT Duration 4 weeks  PT Treatment/Interventions ADLs/Self Care Home Management;Functional mobility training;Gait training;DME Instruction;Therapeutic activities;Therapeutic exercise;Balance training;Neuromuscular re-education;Patient/family education  PT Next Visit Plan begin to check LTGs due 03/05/17  Consulted and Agree with Plan of Care Patient          Patient will benefit from skilled therapeutic  intervention in order to improve the following deficits and impairments:  Abnormal gait, Decreased balance, Decreased mobility, Decreased strength, Decreased knowledge of use of DME, Difficulty walking  Visit Diagnosis: Other abnormalities of gait and mobility  Unsteadiness on feet  Muscle weakness (generalized)     Problem List Patient Active Problem List   Diagnosis Date Noted  . Senile purpura ([place]) 12/18/2016  . Immunization not carried out because of parent refusal 12/18/2016  . Advance care planning 02/18/2016  . Fatigue 10/20/2015  . Unequal blood pressure in upper extremities 10/20/2015  . Choledocholithiasis 02/11/2015  . Constipation 01/01/2015  . Cervical radiculopathy 12/10/2014  . Left knee pain 08/24/2014  . Vitamin D insufficiency 08/24/2014  . Carpal tunnel syndrome 01/07/2014  . 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 Preventative health care 08/15/2013  . CKD (chronic kidney disease) stage 4, GFR 15-29 ml/min (HCC) 02/16/2012  . Osteoarthritis of hand 02/16/2012  . Long-term (current) use of anticoagulants 08/11/2010  . Type 2 diabetes mellitus ([place]) 12/12/2006  . Hyperlipidemia 12/12/2006  . Anemia of chronic disease 12/12/2006  . Hypertension associated with diabetes ([place]) 12/12/2006  . History of CVA (cerebrovascular accident) 09/19/2006    [name], PTA, [facility] [address] [place], [place] [address] [phone] 02/27/17, 9:36 PM   Name: Chelsea Mcdaniel MRN: [id number] Date of Birth: 05/16/1928

## 2017-03-01 ENCOUNTER — Encounter: Payer: Self-pay | Admitting: Physical Therapy

## 2017-03-01 ENCOUNTER — Ambulatory Visit: Payer: Medicare Other | Admitting: Physical Therapy

## 2017-03-01 ENCOUNTER — Telehealth: Payer: Self-pay | Admitting: Physical Therapy

## 2017-03-01 DIAGNOSIS — R2681 Unsteadiness on feet: Secondary | ICD-10-CM

## 2017-03-01 DIAGNOSIS — R2689 Other abnormalities of gait and mobility: Secondary | ICD-10-CM

## 2017-03-01 DIAGNOSIS — M6281 Muscle weakness (generalized): Secondary | ICD-10-CM

## 2017-03-01 DIAGNOSIS — I6930 Unspecified sequelae of cerebral infarction: Secondary | ICD-10-CM

## 2017-03-01 NOTE — Telephone Encounter (Signed)
Dr. Evette Doffing,  Chelsea Mcdaniel is being treated by physical therapy for falls, LE weakness and gait difficulties.  she will benefit from use of RW in order to improve safety with functional mobility.    If you agree, please submit request in EPIC under referral for DME (RW) or fax to [facility] at [phone].   Thank you,  [name], PTA, [facility] [address], [place], [place] [address] [phone] 03/01/17, 8:57 AM   [facility] [address] [place], [place]  [address] Phone:  [phone] Fax:  [phone]

## 2017-03-01 NOTE — Therapy (Signed)
[facility] [address] [place], [place], [address] Phone: [phone]   Fax:  [phone]  Physical Therapy Treatment  Patient Details  Name: Chelsea Mcdaniel MRN: [id number] Date of Birth: 1927-11-17 Referring Provider: Charlott Rakes, MD  Encounter Date: 03/01/2017      PT End of Session - 03/01/17 1109    Visit Number 14   Number of Visits 17  per updated POC   Date for PT Re-Evaluation 03/08/17   Authorization Type UHC Medicare/Medicaid secondary-GCODE   PT Start Time 1104   PT Stop Time 1145   PT Time Calculation (min) 41 min   Equipment Utilized During Treatment Gait belt   Activity Tolerance Patient tolerated treatment well   Behavior During Therapy [facility] for tasks assessed/performed      Past Medical History:  Diagnosis Date  . Anemia     normocytic anemia with baseline hemoglobin 10-11  . Blindness of left eye     likely related to stroke, left cataract removed from that eye with complications  . Calculus gallbladder and bile duct with cholecystitis with obstruction 01/2015  . Chronic kidney disease 2006    left renal artery stenosis with probable hemodynamic significance, kidneys are normal in morphology without focal lesions or hydronephrosis this is based but cannot A. of the abdomen with and without contrast done on the 31st 2006  . CKD (chronic kidney disease), stage III   . CVA (cerebral vascular accident) ([place]) 1990's  . CVA (cerebrovascular accident) [facility])  October 2007    CT of the head atrophy with multiple remote insults noted but no definite acute findings  . Glaucoma   . Gout   . Hyperlipidemia   . Hypertension   . Personal history of DVT (deep vein thrombosis) 08/11/2010   BLE  . Type II diabetes mellitus ([place])     Past Surgical History:  Procedure Laterality Date  . ABDOMINAL HYSTERECTOMY    . CATARACT EXTRACTION Left   . TRANSTHORACIC ECHOCARDIOGRAM   May 2008    left ventricular  systolic function normal EF estimated range of 55-60%, no definite diagnostic evidence of left ventricular regional wall motion abnormalities, Doppler parameters consistent with abnormal left ventricular relaxation, findings suggestive of possible bicuspid aortic valve and aortic valve thickness mildly increase    There were no vitals filed for this visit.      Subjective Assessment - 03/01/17 1108    Subjective No new complaints. No falls or pain to report. Using her personal RW. Have submitted request for RW for pt as current one is too tall on lowest setting, it was given to her.    Pertinent History DM, chronic kidney disease   Patient Stated Goals Pt's goal for therapy is to get steadier on my feet.   Currently in Pain? No/denies   Pain Score 0-No pain            [facility] PT Assessment - 03/01/17 1110      Berg Balance Test   Sit to Stand Able to stand without using hands and stabilize independently   Standing Unsupported Able to stand safely 2 minutes   Sitting with Back Unsupported but Feet Supported on Floor or Stool Able to sit safely and securely 2 minutes   Stand to Sit Sits safely with minimal use of hands   Transfers Able to transfer safely, minor use of hands   Standing Unsupported with Eyes Closed Able to stand 10 seconds safely   Standing  Ubsupported with Feet Together Able to place feet together independently and stand 1 minute safely   From Standing, Reach Forward with Outstretched Arm Can reach forward >12 cm safely (5")  8 inches   From Standing Position, Pick up Object from [place] to pick up shoe safely and easily   From Standing Position, Turn to Look Behind Over each Shoulder Looks behind from both sides and weight shifts well   Turn 360 Degrees Able to turn 360 degrees safely but slowly  4.10 to right, 4.25 to left   Standing Unsupported, Alternately Place Feet on Step/Stool Needs assistance to keep from falling or unable to try   Standing Unsupported, One  Foot in [place] to take small step independently and hold 30 seconds   Standing on One Leg Tries to lift leg/unable to hold 3 seconds but remains standing independently   Total Score 44     Timed Up and Go Test   TUG Normal TUG   Normal TUG (seconds) 16.16  with RW            [facility] Adult PT Treatment/Exercise - 03/01/17 1110      Transfers   Transfers Sit to Stand;Stand to Sit   Sit to Stand 6: Modified independent (Device/Increase time);With upper extremity assist;From bed;From chair/3-in-1;Multiple attempts   Stand to Sit 6: Modified independent (Device/Increase time);With upper extremity assist;To bed;To chair/3-in-1;Uncontrolled descent     Ambulation/Gait   Ambulation/Gait Yes   Ambulation/Gait Assistance 5: Supervision   Ambulation/Gait Assistance Details pt has tendency to veer toward left, needs cues for hand placement on walker and negotiation to correct this through out gait. no loss of balance noted.   Ambulation Distance (Feet) 500 Feet   Assistive device Rolling walker   Gait Pattern Step-through pattern;Decreased stride length;Trunk flexed;Poor foot clearance - left;Poor foot clearance - right   Ambulation Surface Level;Unlevel;Indoor;Outdoor;Paved   Gait velocity 17.81 sec's= 1.84 ft/sec             PT Short Term Goals - 01/07/17 1325      PT SHORT TERM GOAL #1   Title STGs= LTGs (4 weeks)           PT Long Term Goals - 03/01/17 2101      PT LONG TERM GOAL #1   Title Pt will perform HEP with family/aide supervision for improved balance and gait.  UPDATED TARGET DATE for ALL LTGS: 03/05/17)   Baseline Pt doing OTAGO HEP with aide/family every other day. 02/03/17   Status Achieved     PT LONG TERM GOAL #2   Title Pt will improve TUG score to less than or equal to 18 seconds for decreased fall risk.   Baseline 03/01/17: 16.16 with RW   Status Achieved     PT LONG TERM GOAL #3   Title Pt will improve Berg Balance score to at least 46/56 for  decreased fall risk.   Baseline 03/01/17: 44/56 scored today, improved from last check (42/56), just not to goal   Status Partially Met     PT LONG TERM GOAL #4   Title Pt will improve gait velocity to at least 1.8 ft/sec w/ LRAD for improved gait efficiency and safety in the home.    Baseline 03/01/17: 1.84 ft/sec with RW   Status Achieved     PT LONG TERM GOAL #5   Title Pt will verbalize understanding of fall prevention in the home environment.   Baseline Pt needs min cues to recall  fall prevention.    Time 4   Period Weeks   Status On-going     PT LONG TERM GOAL #6   Title Pt will report improvement in ABC score by at least 20% for improved confidence in balance.   Baseline ABC score 23.8% at eval   Time 4   Period Weeks   Status On-going            Plan - 03/01/17 1109    Clinical Impression Statement Pt has met 3/6 LTGs and partially met 1/6 LTG to date. Will plan to check remaining LTGs at next session for anticipated discharge.    PT Frequency 2x / week   PT Duration 4 weeks   PT Treatment/Interventions ADLs/Self Care Home Management;Functional mobility training;Gait training;DME Instruction;Therapeutic activities;Therapeutic exercise;Balance training;Neuromuscular re-education;Patient/family education   PT Next Visit Plan check remaining LTGs, issued RW/scrip for RW if recieved by MD (request sent on 03/01/17 as telephone message)   Consulted and Agree with Plan of Care Patient      Patient will benefit from skilled therapeutic intervention in order to improve the following deficits and impairments:  Abnormal gait, Decreased balance, Decreased mobility, Decreased strength, Decreased knowledge of use of DME, Difficulty walking  Visit Diagnosis: Other abnormalities of gait and mobility  Unsteadiness on feet  Muscle weakness (generalized)     Problem List Patient Active Problem List   Diagnosis Date Noted  . Senile purpura ([place]) 12/18/2016  . Immunization not  carried out because of parent refusal 12/18/2016  . Advance care planning 02/18/2016  . Fatigue 10/20/2015  . Unequal blood pressure in upper extremities 10/20/2015  . Choledocholithiasis 02/11/2015  . Constipation 01/01/2015  . Cervical radiculopathy 12/10/2014  . Left knee pain 08/24/2014  . Vitamin D insufficiency 08/24/2014  . Carpal tunnel syndrome 01/07/2014  . 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 Preventative health care 08/15/2013  . CKD (chronic kidney disease) stage 4, GFR 15-29 ml/min (HCC) 02/16/2012  . Osteoarthritis of hand 02/16/2012  . Long-term (current) use of anticoagulants 08/11/2010  . Type 2 diabetes mellitus ([place]) 12/12/2006  . Hyperlipidemia 12/12/2006  . Anemia of chronic disease 12/12/2006  . Hypertension associated with diabetes ([place]) 12/12/2006  . History of CVA (cerebrovascular accident) 09/19/2006    [name], PTA, [facility] [address] [place], [place] [address] [phone] 03/01/17, 9:06 PM   Name: Chelsea Mcdaniel MRN: [id number] Date of Birth: 1928-01-10

## 2017-03-04 ENCOUNTER — Ambulatory Visit: Payer: Medicare Other | Admitting: Rehabilitation

## 2017-03-04 ENCOUNTER — Encounter: Payer: Self-pay | Admitting: Rehabilitation

## 2017-03-04 DIAGNOSIS — R2681 Unsteadiness on feet: Secondary | ICD-10-CM | POA: Diagnosis not present

## 2017-03-04 DIAGNOSIS — M6281 Muscle weakness (generalized): Secondary | ICD-10-CM | POA: Diagnosis not present

## 2017-03-04 DIAGNOSIS — R2689 Other abnormalities of gait and mobility: Secondary | ICD-10-CM

## 2017-03-04 NOTE — Therapy (Signed)
[facility] [address] [place], [place], [address] Phone: [phone]   Fax:  [phone]  Physical Therapy Treatment and D/C Summary  Patient Details  Name: Chelsea Mcdaniel MRN: [id number] Date of Birth: 09-03-1927 Referring Provider: Heywood Iles, MD  Encounter Date: 03/04/2017      PT End of Session - 03/04/17 1201    Visit Number 15   Number of Visits 17  per updated POC   Date for PT Re-Evaluation 03/08/17   Authorization Type UHC Medicare/Medicaid secondary-GCODE   PT Start Time 1155  pt arrived late for appt   PT Stop Time 1232   PT Time Calculation (min) 37 min   Equipment Utilized During Treatment Gait belt   Activity Tolerance Patient tolerated treatment well   Behavior During Therapy [facility] for tasks assessed/performed      Past Medical History:  Diagnosis Date  . Anemia     normocytic anemia with baseline hemoglobin 10-11  . Blindness of left eye     likely related to stroke, left cataract removed from that eye with complications  . Calculus gallbladder and bile duct with cholecystitis with obstruction 01/2015  . Chronic kidney disease 2006    left renal artery stenosis with probable hemodynamic significance, kidneys are normal in morphology without focal lesions or hydronephrosis this is based but cannot A. of the abdomen with and without contrast done on the 31st 2006  . CKD (chronic kidney disease), stage III   . CVA (cerebral vascular accident) (HCC) 1990's  . CVA (cerebrovascular accident) [facility])  October 2007    CT of the head atrophy with multiple remote insults noted but no definite acute findings  . Glaucoma   . Gout   . Hyperlipidemia   . Hypertension   . Personal history of DVT (deep vein thrombosis) 08/11/2010   BLE  . Type II diabetes mellitus (HCC)     Past Surgical History:  Procedure Laterality Date  . ABDOMINAL HYSTERECTOMY    . CATARACT EXTRACTION Left   . TRANSTHORACIC  ECHOCARDIOGRAM   May 2008    left ventricular systolic function normal EF estimated range of 55-60%, no definite diagnostic evidence of left ventricular regional wall motion abnormalities, Doppler parameters consistent with abnormal left ventricular relaxation, findings suggestive of possible bicuspid aortic valve and aortic valve thickness mildly increase    There were no vitals filed for this visit.      Subjective Assessment - 03/04/17 1159    Subjective Pt reports no changes, no pain, no falls.    Pertinent History DM, chronic kidney disease   Patient Stated Goals Pt's goal for therapy is to get steadier on my feet.   Currently in Pain? No/denies            [facility] PT Assessment - 03/04/17 1217      Berg Balance Test   Sit to Stand Able to stand without using hands and stabilize independently   Standing Unsupported Able to stand safely 2 minutes   Sitting with Back Unsupported but Feet Supported on Floor or Stool Able to sit safely and securely 2 minutes   Stand to Sit Sits safely with minimal use of hands   Transfers Able to transfer safely, minor use of hands   Standing Unsupported with Eyes Closed Able to stand 10 seconds safely   Standing Ubsupported with Feet Together Able to place feet together independently and stand 1 minute safely   From Standing, Reach Forward with Outstretched  Arm Can reach forward >12 cm safely (5")   From Standing Position, Pick up Object from [place] to pick up shoe safely and easily   From Standing Position, Turn to Look Behind Over each Shoulder Looks behind from both sides and weight shifts well   Turn 360 Degrees Able to turn 360 degrees safely but slowly   Standing Unsupported, Alternately Place Feet on Step/Stool Able to complete >2 steps/needs minimal assist   Standing Unsupported, One Foot in Front Able to plae foot ahead of the other independently and hold 30 seconds   Standing on One Leg Tries to lift leg/unable to hold 3 seconds but  remains standing independently   Total Score 46                     [facility] Adult PT Treatment/Exercise - 03/04/17 0001      Ambulation/Gait   Ambulation/Gait Yes   Ambulation/Gait Assistance 5: Supervision   Ambulation/Gait Assistance Details Pt able to ambulate throughout clinic with RW at S level. She is able to state that she needs to have upright posture and ensure that she stays close to RW.      Ambulation Distance (Feet) 300 Feet   Assistive device Rolling walker   Gait Pattern Step-through pattern;Decreased stride length;Trunk flexed;Poor foot clearance - left;Poor foot clearance - right   Ambulation Surface Level;Indoor   Ramp 5: Supervision   Ramp Details (indicate cue type and reason) for safety    Curb 5: Supervision   Curb Details (indicate cue type and reason) for safety with RW     Self-Care   Self-Care Other Self-Care Comments   Other Self-Care Comments  Went over fall prevention strategies to prevent fall inside and outside of home.  Pt unable to independently name items, however when provided with questioning cues, she was able to name several things that she is doing at home to prevent a fall.  Also called MD office during our session as order for RW was not placed in EPIC as of yet.  Note that MD not available in office today but RN left message for him to address on Monday and that they would contact pt at home when order had been faxed to [place].  PT dicussed this with daughter in law as well.       Therapeutic Activites    Therapeutic Activities Other Therapeutic Activities   Other Therapeutic Activities Went over [place] with pt to address FOTO goal.  Pt has made improvement from 23.8% to 68.8% showing that she has significantly improved her self reported confidence following PT.                  PT Education - 03/04/17 1201    Education provided Yes   Education Details continued use of RW, see self care   Person(s) Educated  Patient   Methods Explanation   Comprehension Verbalized understanding          PT Short Term Goals - 01/07/17 1325      PT SHORT TERM GOAL #1   Title STGs= LTGs (4 weeks)           PT Long Term Goals - 03/04/17 1207      PT LONG TERM GOAL #1   Title Pt will perform HEP with family/aide supervision for improved balance and gait.  UPDATED TARGET DATE for ALL LTGS: 03/05/17)   Baseline Pt doing OTAGO HEP with aide/family every other day.  02/03/17   Status Achieved     PT LONG TERM GOAL #2   Title Pt will improve TUG score to less than or equal to 18 seconds for decreased fall risk.   Baseline 03/01/17: 16.16 with RW   Status Achieved     PT LONG TERM GOAL #3   Title Pt will improve Berg Balance score to at least 46/56 for decreased fall risk.   Baseline 03/01/17: 44/56 scored today, improved from last check (42/56), just not to goal, note that PT performed alternating foot taps to 6" step and modified tandem step again today and note mild improvement in score to 46/56)   Status Achieved     PT LONG TERM GOAL #4   Title Pt will improve gait velocity to at least 1.8 ft/sec w/ LRAD for improved gait efficiency and safety in the home.    Baseline 03/01/17: 1.84 ft/sec with RW   Status Achieved     PT LONG TERM GOAL #5   Title Pt will verbalize understanding of fall prevention in the home environment.   Baseline Pt needs cues but she is doing many things to prevent falls at home.    Time 4   Period Weeks   Status Achieved     PT LONG TERM GOAL #6   Title Pt will report improvement in ABC score by at least 20% for improved confidence in balance.   Baseline ABC score 23.8% at eval, 68.8% on 2017-03-24   Time 4   Period Weeks   Status Achieved               Plan - 2017/03/24 1201    Clinical Impression Statement Skilled session focused on addressing remaining LTGs and D/C from therapy.  She has met remaining two goals and PT also repeated two aspects of BERG with improvement  in altnerating foot tap and modified tandem stance, meeting BERG goal as well.  Pt ready for D/C.     PT Frequency 2x / week   PT Duration 4 weeks   PT Treatment/Interventions ADLs/Self Care Home Management;Functional mobility training;Gait training;DME Instruction;Therapeutic activities;Therapeutic exercise;Balance training;Neuromuscular re-education;Patient/family education   PT Next Visit Plan --   Consulted and Agree with Plan of Care Patient      Patient will benefit from skilled therapeutic intervention in order to improve the following deficits and impairments:  Abnormal gait, Decreased balance, Decreased mobility, Decreased strength, Decreased knowledge of use of DME, Difficulty walking  Visit Diagnosis: Other abnormalities of gait and mobility  Unsteadiness on feet  Muscle weakness (generalized)       G-Codes - 03/24/17 1304    Functional Assessment Tool Used (Outpatient Only) BERG: 46/56, TUG: 16.16 w/ RW   Functional Limitation Mobility: Walking and moving around   Mobility: Walking and Moving Around Current Status [phone]) At least 1 percent but less than 20 percent impaired, limited or restricted   Mobility: Walking and Moving Around Goal Status [phone]) At least 20 percent but less than 40 percent impaired, limited or restricted   Mobility: Walking and Moving Around Discharge Status [phone]) At least 1 percent but less than 20 percent impaired, limited or restricted       PHYSICAL THERAPY DISCHARGE SUMMARY  Visits from Start of Care: 15  Current functional level related to goals / functional outcomes: See LTGs   Remaining deficits: Pt with balance deficits, however have recommended use of RW at all times to prevent fall.  She is limited due to decreased cognition.  Education / Equipment: HEP  Plan: Patient agrees to discharge.  Patient goals were met. Patient is being discharged due to meeting the stated rehab goals.  ?????        Problem List Patient  Active Problem List   Diagnosis Date Noted  . Senile purpura ([place]) 12/18/2016  . Immunization not carried out because of parent refusal 12/18/2016  . Advance care planning 02/18/2016  . Fatigue 10/20/2015  . Unequal blood pressure in upper extremities 10/20/2015  . Choledocholithiasis 02/11/2015  . Constipation 01/01/2015  . Cervical radiculopathy 12/10/2014  . Left knee pain 08/24/2014  . Vitamin D insufficiency 08/24/2014  . Carpal tunnel syndrome 01/07/2014  . 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 Preventative health care 08/15/2013  . CKD (chronic kidney disease) stage 4, GFR 15-29 ml/min (HCC) 02/16/2012  . Osteoarthritis of hand 02/16/2012  . Long-term (current) use of anticoagulants 08/11/2010  . Type 2 diabetes mellitus ([place]) 12/12/2006  . Hyperlipidemia 12/12/2006  . Anemia of chronic disease 12/12/2006  . Hypertension associated with diabetes ([place]) 12/12/2006  . History of CVA (cerebrovascular accident) 09/19/2006    Cameron Sprang, PT, MPT [facility] [address] [place], [place], [address] Phone: [phone]   Fax:  [phone] 03/04/17, 1:06 PM  Name: Chelsea Mcdaniel MRN: [id number] Date of Birth: 1927-10-13

## 2017-03-04 NOTE — Telephone Encounter (Signed)
Done. Thank you.

## 2017-03-08 DIAGNOSIS — R531 Weakness: Secondary | ICD-10-CM | POA: Diagnosis not present

## 2017-03-14 ENCOUNTER — Ambulatory Visit (INDEPENDENT_AMBULATORY_CARE_PROVIDER_SITE_OTHER): Payer: Medicare Other | Admitting: Pharmacist

## 2017-03-14 DIAGNOSIS — Z86718 Personal history of other venous thrombosis and embolism: Secondary | ICD-10-CM

## 2017-03-14 DIAGNOSIS — Z7901 Long term (current) use of anticoagulants: Secondary | ICD-10-CM

## 2017-03-14 LAB — POCT INR: INR: 2.1

## 2017-03-14 NOTE — Patient Instructions (Signed)
Patient instructed to take medications as defined in the Anti-coagulation Track section of this encounter.  Patient instructed to take today's dose.  Patient instructed to take one (1) of your 5mg  peach-colored warfarin tablets by mouth, once-daily at [facility]. Patient verbalized understanding of these instructions.

## 2017-03-14 NOTE — Progress Notes (Signed)
Anticoagulation Management Chelsea Mcdaniel is a 83 y.o. female who reports to the clinic for monitoring of warfarin treatment.    Indication: DVT , history of, on chronic anticoagulation.  Duration: indefinite Supervising physician: Carlynn Purl  Anticoagulation Clinic Visit History: Patient does not report signs/symptoms of bleeding or thromboembolism  Other recent changes: No diet, medications, lifestyle changes endorsed by patient to me.  Anticoagulation Episode Summary    Current INR goal:   2.0-3.0  TTR:   65.7 % (3.2 y)  Next INR check:   04/11/2017  INR from last check:   2.10 (03/14/2017)  Weekly max warfarin dose:     Target end date:     INR check location:     Preferred lab:     Send INR reminders to:      Indications   EMBOLISM/THROMBOSIS DEEP VSL LWR EXTRM NOS (Resolved) [I82.409] Long-term (current) use of anticoagulants [Z79.01]       Comments:           Allergies  Allergen Reactions  . Diltiazem Other (See Comments)    Symptomatic bradaycardia  . Metoprolol Other (See Comments)    Symptomatic bradycardia   Prior to Admission medications   Medication Sig Start Date End Date Taking? Authorizing Provider  ACCU-CHEK AVIVA PLUS test strip USE 1 TIME DAILY TO CHECK BLOOD SUGAR 02/18/16  Yes Beather Arbour, MD  ACCU-CHEK SOFTCLIX LANCETS lancets Used to check blood sugar 3 times daily. Dx code E11.8 11/01/16  Yes Beather Arbour, MD  amLODipine (NORVASC) 10 MG tablet TAKE 1 TABLET BY MOUTH EVERY DAY 01/20/17  Yes Tyson Alias, MD  Blood Glucose Monitoring Suppl (ACCU-CHEK AVIVA PLUS) w/Device KIT Check blood sugar 1 time a day 09/30/16  Yes Beather Arbour, MD  Calcium Carbonate-Vitamin D 600-400 MG-UNIT per tablet Take 1 tablet by mouth 2 (two) times daily.   Yes [provider]  cloNIDine (CATAPRES - DOSED IN MG/24 HR) 0.2 mg/24hr patch Place 1 patch (0.2 mg total) onto the skin every Thursday. 02/26/16  Yes Patel, Hal Morales, MD  dorzolamide-timolol  (COSOPT) 22.3-6.8 MG/ML ophthalmic solution Place 1 drop into both eyes 2 (two) times daily. 01/21/16  Yes [provider]  GNP STOOL SOFTENER/LAXATIVE 8.6-50 MG tablet TAKE 2 TABLETS BY MOUTH AT BEDTIME AS NEEDED FOR CONSTIPATION 07/16/16  Yes Inez Catalina, MD  hydrALAZINE (APRESOLINE) 10 MG tablet Take 1 tablet (10 mg total) by mouth 3 (three) times daily. 02/25/16  Yes Patel, Rushil V, MD  JANUVIA 25 MG tablet TAKE 1 TABLET BY MOUTH EVERY DAY 08/23/16  Yes Patel, Rushil V, MD  LUMIGAN 0.01 % SOLN Place 1 drop into both eyes every morning.  12/05/13  Yes [provider]  polyethylene glycol (MIRALAX) packet Take 17 g by mouth 2 (two) times daily. 08/25/16  Yes Mackuen, Courteney Lyn, MD  polyethylene glycol powder (GLYCOLAX/MIRALAX) powder Take 17 g by mouth daily. 03/16/16  Yes Beather Arbour, MD  warfarin (COUMADIN) 5 MG tablet Take 1.5 tablets on Sundays, Tuesdays and Thursdays. All other days, take only 1 tablet. 11/29/16  Yes Doneen Poisson, MD  allopurinol (ZYLOPRIM) 100 MG tablet Take 1.5 tablets (150 mg total) by mouth daily. 02/25/16 02/24/17  Beather Arbour, MD   Past Medical History:  Diagnosis Date  . Anemia     normocytic anemia with baseline hemoglobin 10-11  . Blindness of left eye     likely related to stroke, left cataract removed from that  eye with complications  . Calculus gallbladder and bile duct with cholecystitis with obstruction 01/2015  . Chronic kidney disease 2006    left renal artery stenosis with probable hemodynamic significance, kidneys are normal in morphology without focal lesions or hydronephrosis this is based but cannot A. of the abdomen with and without contrast done on the 31st 2006  . CKD (chronic kidney disease), stage III   . CVA (cerebral vascular accident) ([place]) 1990's  . CVA (cerebrovascular accident) [facility])  October 2007    CT of the head atrophy with multiple remote insults noted but no definite acute findings  . Glaucoma   . Gout   .  Hyperlipidemia   . Hypertension   . Personal history of DVT (deep vein thrombosis) 08/11/2010   BLE  . Type II diabetes mellitus ([place])    Social History   Social History  . Marital status: Widowed    Spouse name: N/A  . Number of children: N/A  . Years of education: N/A   Social History Main Topics  . Smoking status: Never Smoker  . Smokeless tobacco: Never Used  . Alcohol use No  . Drug use: No  . Sexual activity: No   Other Topics Concern  . Not on file   Social History Narrative    Patient is a widow. She has 11 children 5 of who are living. She is retired in 1993 from [employer]. She denies tobacco alcohol or drug use.   Family History  Problem Relation Age of Onset  . Heart disease Mother   . Diabetes Mother   . Hyperlipidemia Mother   . Hypertension Mother   . Heart disease Father   . Diabetes Father   . Hyperlipidemia Father   . Hypertension Father     ASSESSMENT Recent Results: The most recent result is correlated with 32.5 mg per week: Lab Results  Component Value Date   INR 2.10 03/14/2017   INR 2.70 02/14/2017   INR 2.60 01/17/2017    Anticoagulation Dosing: INR as of 03/14/2017 and Previous Warfarin Dosing Information    INR Dt INR Goal Wkly Tot Sun Mon Tue Wed Thu Fri Sat   03/14/2017 2.10 2.0-3.0 32.5 mg 5 mg 5 mg 2.5 mg 5 mg 5 mg 5 mg 5 mg    Previous description   Take 1 tablet by mouth once-daily at 6PM on all days of the week--EXCEPT on TUESDAYS. On Tuesdays--give only 1/2 tablet of your '5mg'$  peach-colored warfarin tablets.     Anticoagulation Warfarin Dose Instructions as of 03/14/2017      Total Sun Mon Tue Wed Thu Fri Sat   New Dose 35 mg 5 mg 5 mg 5 mg 5 mg 5 mg 5 mg 5 mg     (5 mg x 1)  (5 mg x 1)  (5 mg x 1)  (5 mg x 1)  (5 mg x 1)  (5 mg x 1)  (5 mg x 1)                         Description   Take 1 tablet by mouth once-daily at [facility] on all days of the week.      INR today: Therapeutic  PLAN Weekly dose was increased  by 1/2 tablet total per week to '35mg'$ /wk.  Patient Instructions  Patient instructed to take medications as defined in the Anti-coagulation Track section of this encounter.  Patient instructed to take today's dose.  Patient instructed to take one (1) of your '5mg'$  peach-colored warfarin tablets by mouth, once-daily at [facility]. Patient verbalized understanding of these instructions.     Patient advised to contact clinic or seek medical attention if signs/symptoms of bleeding or thromboembolism occur.  Patient verbalized understanding by repeating back information and was advised to contact me if further medication-related questions arise. Patient was also provided an information handout.  Follow-up Return in 4 weeks (on 04/11/2017) for Follow up INR at 3PM.  Ceasar Lund, Eustaquio Boyden PharmD, CACP, CPP  15 minutes spent face-to-face with the patient during the encounter. 50% of time spent on education. 50% of time was spent on fingerstick point of care INR sample collection, processing, results interpretation, dose adjustment, documentation within EPIC/CHL and [email or web address]

## 2017-03-21 NOTE — Progress Notes (Signed)
INTERNAL MEDICINE TEACHING ATTENDING ADDENDUM - Lucious Groves, DO Duration- indefinate, Indication- recurrent CVA, DVT, INR-  Lab Results  Component Value Date   INR 2.10 03/14/2017  . Agree with pharmacy recommendations as outlined in their note.

## 2017-03-25 ENCOUNTER — Other Ambulatory Visit: Payer: Self-pay | Admitting: *Deleted

## 2017-03-25 DIAGNOSIS — M1A3710 Chronic gout due to renal impairment, right ankle and foot, without tophus (tophi): Secondary | ICD-10-CM

## 2017-03-25 MED ORDER — HYDRALAZINE HCL 10 MG PO TABS: 10.0000 mg | ORAL_TABLET | Freq: Three times a day (TID) | ORAL | 3 refills | Status: DC

## 2017-03-25 MED ORDER — POLYETHYLENE GLYCOL 3350 17 G PO PACK: 17.0000 g | PACK | Freq: Two times a day (BID) | ORAL | 1 refills | Status: DC

## 2017-03-25 MED ORDER — ALLOPURINOL 100 MG PO TABS: 150.0000 mg | ORAL_TABLET | Freq: Every day | ORAL | 3 refills | Status: DC

## 2017-03-25 MED ORDER — POLYETHYLENE GLYCOL 3350 17 GM/SCOOP PO POWD: 17.0000 g | Freq: Two times a day (BID) | ORAL | 2 refills | Status: DC

## 2017-03-25 MED ORDER — CLONIDINE HCL 0.2 MG/24HR TD PTWK: 0.2000 mg | MEDICATED_PATCH | TRANSDERMAL | 11 refills | Status: DC

## 2017-03-25 NOTE — Telephone Encounter (Signed)
Miralax rx faxed to [facility].

## 2017-03-29 ENCOUNTER — Telehealth: Payer: Self-pay | Admitting: *Deleted

## 2017-03-29 NOTE — Telephone Encounter (Signed)
Patient monitors her INR regularly and has been on these medications. I will defer to Dr. Maricela Bo as I do not know this patient but I think it would be ok

## 2017-03-29 NOTE — Telephone Encounter (Signed)
Call from [facility] pharmacist, [place] - states severe interaction between Allopurinol and Warfarin - can cause increase risk of bleeding. Wants to know if u still want pt to have Allopurinol as ordered on 8/13? Thanks

## 2017-03-29 NOTE — Telephone Encounter (Signed)
Talked to Mannie Stabile, per Dr Wilber Bihari request; stated should be ok to take Allopurinol and Warfarin. Pharmacist at [facility] called/informed of their response.

## 2017-04-11 ENCOUNTER — Ambulatory Visit (INDEPENDENT_AMBULATORY_CARE_PROVIDER_SITE_OTHER): Payer: Medicare Other | Admitting: Pharmacist

## 2017-04-11 VITALS — BP 161/55 | HR 70 | Temp 97.8°F

## 2017-04-11 DIAGNOSIS — Z7901 Long term (current) use of anticoagulants: Secondary | ICD-10-CM | POA: Diagnosis not present

## 2017-04-11 DIAGNOSIS — Z86718 Personal history of other venous thrombosis and embolism: Secondary | ICD-10-CM

## 2017-04-11 LAB — POCT INR: INR: 2

## 2017-04-11 MED ORDER — FUROSEMIDE 8 MG/ML PO SOLN: 80.0000 mg | Freq: Two times a day (BID) | ORAL | 1 refills | Status: DC

## 2017-04-11 NOTE — Progress Notes (Signed)
Anticoagulation Management Chelsea Mcdaniel is a 83 y.o. female who reports to the clinic for monitoring of warfarin treatment.    Indication: DVT , history of; long term anticoagulation with warfarin for prevention of secondary VTE. Duration: indefinite Supervising physician: Lalla Brothers  Anticoagulation Clinic Visit History: Patient does not report signs/symptoms of bleeding or thromboembolism  Other recent changes: No diet, medications, lifestyle changes other than as noted in patient findings.  Anticoagulation Episode Summary    Current INR goal:   2.0-3.0  TTR:   66.6 % (3.3 y)  Next INR check:   04/18/2017  INR from last check:   2.0 (04/11/2017)  Weekly max warfarin dose:     Target end date:     INR check location:     Preferred lab:     Send INR reminders to:      Indications   EMBOLISM/THROMBOSIS DEEP VSL LWR EXTRM NOS (Resolved) [I82.409] Long-term (current) use of anticoagulants [Z79.01]       Comments:           Allergies  Allergen Reactions  . Diltiazem Other (See Comments)    Symptomatic bradaycardia  . Metoprolol Other (See Comments)    Symptomatic bradycardia   Prior to Admission medications   Medication Sig Start Date End Date Taking? Authorizing Provider  ACCU-CHEK AVIVA PLUS test strip USE 1 TIME DAILY TO CHECK BLOOD SUGAR 02/18/16  Yes Riccardo Dubin, MD  ACCU-CHEK SOFTCLIX LANCETS lancets Used to check blood sugar 3 times daily. Dx code E11.8 11/01/16  Yes Riccardo Dubin, MD  allopurinol (ZYLOPRIM) 100 MG tablet Take 1.5 tablets (150 mg total) by mouth daily. 03/25/17 03/25/18 Yes [name], MD  amLODipine (NORVASC) 10 MG tablet TAKE 1 TABLET BY MOUTH EVERY DAY 01/20/17  Yes Axel Filler, MD  Blood Glucose Monitoring Suppl (ACCU-CHEK AVIVA PLUS) w/Device KIT Check blood sugar 1 time a day 09/30/16  Yes Riccardo Dubin, MD  Calcium Carbonate-Vitamin D 600-400 MG-UNIT per tablet Take 1 tablet by mouth 2 (two) times daily.   Yes  [provider]  cloNIDine (CATAPRES - DOSED IN MG/24 HR) 0.2 mg/24hr patch Place 1 patch (0.2 mg total) onto the skin every Thursday. 03/31/17  Yes [name], MD  dorzolamide-timolol (COSOPT) 22.3-6.8 MG/ML ophthalmic solution Place 1 drop into both eyes 2 (two) times daily. 01/21/16  Yes [provider]  furosemide (LASIX) 8 MG/ML solution Take 10 mLs (80 mg total) by mouth 2 (two) times daily. 04/11/17  Yes Axel Filler, MD  GNP STOOL SOFTENER/LAXATIVE 8.6-50 MG tablet TAKE 2 TABLETS BY MOUTH AT BEDTIME AS NEEDED FOR CONSTIPATION 07/16/16  Yes Sid Falcon, MD  hydrALAZINE (APRESOLINE) 10 MG tablet Take 1 tablet (10 mg total) by mouth 3 (three) times daily. 03/25/17  Yes [name], MD  JANUVIA 25 MG tablet TAKE 1 TABLET BY MOUTH EVERY DAY 08/23/16  Yes Patel, Rushil V, MD  LUMIGAN 0.01 % SOLN Place 1 drop into both eyes every morning.  12/05/13  Yes [provider]  polyethylene glycol (MIRALAX) packet Take 17 g by mouth 2 (two) times daily. 03/25/17  Yes [name], MD  polyethylene glycol powder (GLYCOLAX/MIRALAX) powder Take 17 g by mouth 2 (two) times daily. 03/25/17  Yes Bartholomew Crews, MD  warfarin (COUMADIN) 5 MG tablet Take 1.5 tablets on Sundays, Tuesdays and Thursdays. All other days, take only 1 tablet. 11/29/16  Yes Oval Linsey, MD   Past Medical  History:  Diagnosis Date  . Anemia     normocytic anemia with baseline hemoglobin 10-11  . Blindness of left eye     likely related to stroke, left cataract removed from that eye with complications  . Calculus gallbladder and bile duct with cholecystitis with obstruction 01/2015  . Chronic kidney disease 2006    left renal artery stenosis with probable hemodynamic significance, kidneys are normal in morphology without focal lesions or hydronephrosis this is based but cannot A. of the abdomen with and without contrast done on the 31st 2006  . CKD (chronic kidney  disease), stage III   . CVA (cerebral vascular accident) ([place]) 1990's  . CVA (cerebrovascular accident) [place])  October 2007    CT of the head atrophy with multiple remote insults noted but no definite acute findings  . Glaucoma   . Gout   . Hyperlipidemia   . Hypertension   . Personal history of DVT (deep vein thrombosis) 08/11/2010   BLE  . Type II diabetes mellitus ([place])    Social History   Social History  . Marital status: Widowed    Spouse name: N/A  . Number of children: N/A  . Years of education: N/A   Social History Main Topics  . Smoking status: Never Smoker  . Smokeless tobacco: Never Used  . Alcohol use No  . Drug use: No  . Sexual activity: No   Other Topics Concern  . Not on file   Social History Narrative    Patient is a widow. She has 11 children 5 of who are living. She is retired in 1993 from [employer]. She denies tobacco alcohol or drug use.   Family History  Problem Relation Age of Onset  . Heart disease Mother   . Diabetes Mother   . Hyperlipidemia Mother   . Hypertension Mother   . Heart disease Father   . Diabetes Father   . Hyperlipidemia Father   . Hypertension Father     ASSESSMENT Recent Results: The most recent result is correlated with 35 mg per week: Lab Results  Component Value Date   INR 2.0 04/11/2017   INR 2.10 03/14/2017   INR 2.70 02/14/2017    Anticoagulation Dosing: INR as of 04/11/2017 and Previous Warfarin Dosing Information    INR Dt INR Goal Wkly Tot Sun Mon Tue Wed Thu Fri Sat   04/11/2017 2.0 2.0-3.0 35 _0  mg 5 mg 5 mg    Previous description   Take 1 tablet by mouth once-daily at 6PM on all days of the week.    Anticoagulation Warfarin Dose Instructions as of 04/11/2017      Total Sun Mon Tue Wed Thu Fri Sat   New Dose 40 mg 5 mg 7.5 mg 5 mg 5 mg 7.5 mg 5 mg 5 mg     (5 mg x 1)  (5 mg x 1.5)  (5 mg x 1)  (5 mg x 1)  (5 mg x 1.5)  (5 mg x 1)  (5 mg x 1)                          Description   Take 1 tablet by mouth once-daily at [facility] on all days of the week--EXCEPT on MONDAYS and THURSDAYS--take 1 & 1/2 tablets of your 84m peach colored warfarin tablets on these days.      INR today: Therapeutic  PLAN Weekly dose was increased by 12% to 40 mg per week  Patient Instructions  Patient instructed to take medications as defined in the Anti-coagulation Track section of this encounter.  Patient instructed to take today's dose.  Patient instructed to take 1 tablet by mouth once-daily at [facility] on all days of the week--EXCEPT on MONDAYS and THURSDAYS--take 1 & 1/2 tablets of your 50m peach colored warfarin tablets on these.  Patient instructed to INCREASE her oral furosemide solution---take 842m0.8 mL from the dropper in your bottle--TWICE DAILY (early AM and no later than 4PM each day) Patient verbalized understanding of these instructions.     Patient advised to contact clinic or seek medical attention if signs/symptoms of bleeding or thromboembolism occur.  Patient verbalized understanding by repeating back information and was advised to contact me if further medication-related questions arise. Patient was also provided an information handout.  Follow-up Return in 7 days (on 04/18/2017) for Follow up INR .  JaDorene Greberoce  15 minutes spent face-to-face with the patient during the encounter. 50% of time spent on education. 50% of time was spent on fingerstick point of care INR sample collection, processing, results interpretation, dose adjustment and documentation in EPIC/CHL and [email or web address]

## 2017-04-11 NOTE — Progress Notes (Signed)
    Dr. Elie Confer asked me to evaluate Chelsea Mcdaniel because of increasing lower extremity edema. The patient has heart failure with preserved ejection fraction and stage III CK D. She follows with the nephrology clinic, last creatinine in July was 2.04. They increased her furosemide to 80 mg daily, and a liquid formulation. Over the last week she's had increasing lower extremity edema. On exam today she appears well, sitting up, no distress. She has 2+ pitting edema bilaterally in her lower extremities. Heart is regular rate and rhythm. She denies dyspnea on exertion, orthopnea, or PND. No chest pain or pressure.  Plan is to increase furosemide to 80 mg twice a day for 1 week. Follow up in the [facility] early next week so we can reassess her volume and recheck her renal function.

## 2017-04-11 NOTE — Patient Instructions (Signed)
Patient instructed to take medications as defined in the Anti-coagulation Track section of this encounter.  Patient instructed to take today's dose.  Patient instructed to take 1 tablet by mouth once-daily at [facility] on all days of the week--EXCEPT on MONDAYS and THURSDAYS--take 1 & 1/2 tablets of your 5mg  peach colored warfarin tablets on these.  Patient instructed to INCREASE her oral furosemide solution---take 80mg /0.8 mL from the dropper in your bottle--TWICE DAILY (early AM and no later than 4PM each day) Patient verbalized understanding of these instructions.

## 2017-04-12 NOTE — Addendum Note (Signed)
Addended by: Forde Dandy on: 04/12/2017 09:53 AM   Modules accepted: Orders

## 2017-04-15 ENCOUNTER — Telehealth: Payer: Self-pay | Admitting: *Deleted

## 2017-04-15 NOTE — Telephone Encounter (Signed)
Called caregiver with instructions to give 80 mg (8 ml) twice a day. Verified dose, dropper size. Verbalized understanding (utilized teachback).

## 2017-04-15 NOTE — Telephone Encounter (Signed)
Yes. The idea was to do Lasix 80mg  twice a day for one week. Then we should see her in [facility] on Monday. If the swelling is no better by then, she may need hospital admission for more aggressive diuresis.

## 2017-04-15 NOTE — Telephone Encounter (Signed)
Patient's caregiver called stating patient's legs look more swollen. Stated " I didn't give the furosemide today because I didn't know what to do". Advised it's very important to give the patient to help with the swelling. She was giving the patient 60 mg twice a day. Looking back at note from 9.17 (INR visit), lasix was ordered for 80 mg BID. Is this the right dose? Pls. Review so I can call her back with proper instructions. Has appt 04/18/17. Thanks!

## 2017-04-18 ENCOUNTER — Encounter: Payer: Self-pay | Admitting: Internal Medicine

## 2017-04-18 ENCOUNTER — Ambulatory Visit (INDEPENDENT_AMBULATORY_CARE_PROVIDER_SITE_OTHER): Payer: Medicare Other | Admitting: Internal Medicine

## 2017-04-18 ENCOUNTER — Ambulatory Visit (INDEPENDENT_AMBULATORY_CARE_PROVIDER_SITE_OTHER): Payer: Medicare Other | Admitting: Pharmacist

## 2017-04-18 VITALS — BP 141/54 | HR 66 | Temp 97.7°F | Ht 62.0 in | Wt 140.5 lb

## 2017-04-18 DIAGNOSIS — R600 Localized edema: Secondary | ICD-10-CM | POA: Diagnosis not present

## 2017-04-18 DIAGNOSIS — Z7901 Long term (current) use of anticoagulants: Secondary | ICD-10-CM

## 2017-04-18 DIAGNOSIS — E8779 Other fluid overload: Secondary | ICD-10-CM

## 2017-04-18 DIAGNOSIS — R011 Cardiac murmur, unspecified: Secondary | ICD-10-CM

## 2017-04-18 DIAGNOSIS — N184 Chronic kidney disease, stage 4 (severe): Secondary | ICD-10-CM | POA: Diagnosis not present

## 2017-04-18 DIAGNOSIS — I129 Hypertensive chronic kidney disease with stage 1 through stage 4 chronic kidney disease, or unspecified chronic kidney disease: Secondary | ICD-10-CM | POA: Diagnosis not present

## 2017-04-18 DIAGNOSIS — Z79899 Other long term (current) drug therapy: Secondary | ICD-10-CM

## 2017-04-18 DIAGNOSIS — E1122 Type 2 diabetes mellitus with diabetic chronic kidney disease: Secondary | ICD-10-CM | POA: Diagnosis not present

## 2017-04-18 DIAGNOSIS — Z86718 Personal history of other venous thrombosis and embolism: Secondary | ICD-10-CM

## 2017-04-18 DIAGNOSIS — M25472 Effusion, left ankle: Secondary | ICD-10-CM

## 2017-04-18 DIAGNOSIS — M25471 Effusion, right ankle: Secondary | ICD-10-CM | POA: Insufficient documentation

## 2017-04-18 LAB — POCT INR: INR: 2.8

## 2017-04-18 NOTE — Assessment & Plan Note (Signed)
Patient with last Cr of 2.07 in July 2018. Will recheck her Bmet today in setting of increasing her lasix.

## 2017-04-18 NOTE — Assessment & Plan Note (Addendum)
Patient with some improvement in her LE edema, however still uncontrolled. She does not have signs or symptoms of pulm edema which is reassuring for now.   Plan: --continue furosemide 80mg  BID --advised use of compression stockings --f/u Bmet for electrolytes and renal function --f/u in 2 weeks, rct or ED if she develops shortness of breath, chest pain, worsening edema --can consider metolazone if still uncontrolled  Addendum: Bmet reveals stable electrolytes and stable fluctuation of her Cr which has been the case over the last year. Currently Cr 2.36. Continue with current management plan as she is volume overloaded and otherwise does not have signs or symptoms of intravascular volume depletion.

## 2017-04-18 NOTE — Patient Instructions (Addendum)
Keep taking 64ml of furosemide twice daily.  Wear compression stockings every day.  We'll see you in 2 weeks to recheck your legs.

## 2017-04-18 NOTE — Progress Notes (Signed)
Anticoagulation Management Chelsea Mcdaniel is a 83 y.o. female who reports to the clinic for monitoring of warfarin treatment.    Indication:  DVT, history of; long term (current) use of anticoagulants. Duration: indefinite Supervising physician: Lalla Brothers  Anticoagulation Clinic Visit History: Patient does not report signs/symptoms of bleeding or thromboembolism  Other recent changes: No diet, medications, lifestyle changes endorsed to me. Anticoagulation Episode Summary    Current INR goal:   2.0-3.0  TTR:   66.8 % (3.3 y)  Next INR check:   05/09/2017  INR from last check:   2.80 (04/18/2017)  Weekly max warfarin dose:     Target end date:     INR check location:     Preferred lab:     Send INR reminders to:      Indications   EMBOLISM/THROMBOSIS DEEP VSL LWR EXTRM NOS (Resolved) [I82.409] Long-term (current) use of anticoagulants [Z79.01]       Comments:           Allergies  Allergen Reactions  . Diltiazem Other (See Comments)    Symptomatic bradaycardia  . Metoprolol Other (See Comments)    Symptomatic bradycardia   Prior to Admission medications   Medication Sig Start Date End Date Taking? Authorizing Provider  ACCU-CHEK AVIVA PLUS test strip USE 1 TIME DAILY TO CHECK BLOOD SUGAR 02/18/16  Yes Riccardo Dubin, MD  ACCU-CHEK SOFTCLIX LANCETS lancets Used to check blood sugar 3 times daily. Dx code E11.8 11/01/16  Yes Riccardo Dubin, MD  allopurinol (ZYLOPRIM) 100 MG tablet Take 1.5 tablets (150 mg total) by mouth daily. 03/25/17 03/25/18 Yes [name], MD  amLODipine (NORVASC) 10 MG tablet TAKE 1 TABLET BY MOUTH EVERY DAY 01/20/17  Yes Axel Filler, MD  Blood Glucose Monitoring Suppl (ACCU-CHEK AVIVA PLUS) w/Device KIT Check blood sugar 1 time a day 09/30/16  Yes Riccardo Dubin, MD  Calcium Carbonate-Vitamin D 600-400 MG-UNIT per tablet Take 1 tablet by mouth 2 (two) times daily.   Yes [provider]  cloNIDine (CATAPRES - DOSED IN  MG/24 HR) 0.2 mg/24hr patch Place 1 patch (0.2 mg total) onto the skin every Thursday. 03/31/17  Yes [name], MD  dorzolamide-timolol (COSOPT) 22.3-6.8 MG/ML ophthalmic solution Place 1 drop into both eyes 2 (two) times daily. 01/21/16  Yes [provider]  furosemide (LASIX) 10 MG/ML solution Take 60 mg by mouth 2 (two) times daily.   Yes [provider]  GNP STOOL SOFTENER/LAXATIVE 8.6-50 MG tablet TAKE 2 TABLETS BY MOUTH AT BEDTIME AS NEEDED FOR CONSTIPATION 07/16/16  Yes Sid Falcon, MD  hydrALAZINE (APRESOLINE) 10 MG tablet Take 1 tablet (10 mg total) by mouth 3 (three) times daily. 03/25/17  Yes [name], MD  JANUVIA 25 MG tablet TAKE 1 TABLET BY MOUTH EVERY DAY 08/23/16  Yes Patel, Rushil V, MD  LUMIGAN 0.01 % SOLN Place 1 drop into both eyes every morning.  12/05/13  Yes [provider]  polyethylene glycol (MIRALAX) packet Take 17 g by mouth 2 (two) times daily. 03/25/17  Yes [name], MD  polyethylene glycol powder (GLYCOLAX/MIRALAX) powder Take 17 g by mouth 2 (two) times daily. 03/25/17  Yes Bartholomew Crews, MD  warfarin (COUMADIN) 5 MG tablet Take 1.5 tablets on Sundays, Tuesdays and Thursdays. All other days, take only 1 tablet. 11/29/16  Yes Oval Linsey, MD   Past Medical History:  Diagnosis Date  . Anemia     normocytic anemia  with baseline hemoglobin 10-11  . Blindness of left eye     likely related to stroke, left cataract removed from that eye with complications  . Calculus gallbladder and bile duct with cholecystitis with obstruction 01/2015  . Chronic kidney disease 2006    left renal artery stenosis with probable hemodynamic significance, kidneys are normal in morphology without focal lesions or hydronephrosis this is based but cannot A. of the abdomen with and without contrast done on the 31st 2006  . CKD (chronic kidney disease), stage III   . CVA (cerebral vascular accident) ([place]) 1990's  . CVA  (cerebrovascular accident) [facility])  October 2007    CT of the head atrophy with multiple remote insults noted but no definite acute findings  . Glaucoma   . Gout   . Hyperlipidemia   . Hypertension   . Personal history of DVT (deep vein thrombosis) 08/11/2010   BLE  . Type II diabetes mellitus ([place])    Social History   Social History  . Marital status: Widowed    Spouse name: N/A  . Number of children: N/A  . Years of education: N/A   Social History Main Topics  . Smoking status: Never Smoker  . Smokeless tobacco: Never Used  . Alcohol use No  . Drug use: No  . Sexual activity: No   Other Topics Concern  . Not on file   Social History Narrative    Patient is a widow. She has 11 children 5 of who are living. She is retired in 1993 from [employer]. She denies tobacco alcohol or drug use.   Family History  Problem Relation Age of Onset  . Heart disease Mother   . Diabetes Mother   . Hyperlipidemia Mother   . Hypertension Mother   . Heart disease Father   . Diabetes Father   . Hyperlipidemia Father   . Hypertension Father     ASSESSMENT Recent Results: The most recent result is correlated with 40 mg per week: Lab Results  Component Value Date   INR 2.80 04/18/2017   INR 2.0 04/11/2017   INR 2.10 03/14/2017    Anticoagulation Dosing: INR as of 04/18/2017 and Previous Warfarin Dosing Information    INR Dt INR Goal Wkly Tot Sun Mon Tue Wed Thu Fri Sat   04/18/2017 2.80 2.0-3.0 40 mg 5 mg 7.5 mg 5 mg 5 mg 7.5 mg 5 mg 5 mg    Previous description   Take 1 tablet by mouth once-daily at 6PM on all days of the week--EXCEPT on MONDAYS and THURSDAYS--take 1 & 1/2 tablets of your '5mg'$  peach colored warfarin tablets on these days.    Anticoagulation Warfarin Dose Instructions as of 04/18/2017      Total Sun Mon Tue Wed Thu Fri Sat   New Dose 37.5 mg 5 mg 5 mg 5 mg 7.5 mg 5 mg 5 mg 5 mg     (5 mg x 1)  (5 mg x 1)  (5 mg x 1)  (5 mg x 1.5)  (5 mg x 1)  (5 mg x 1)  (5  mg x 1)                         Description   Take 1 tablet by mouth once-daily at [facility] on all days of the week--EXCEPT on [facility], take 1 & 1/2 tablets of your '5mg'$  peach colored warfarin tablets on WEDNESDAYS.  INR today: Therapeutic  PLAN Weekly dose was decreased by 6% to 37.5 mg per week  Patient Instructions  Patient instructed to take medications as defined in the Anti-coagulation Track section of this encounter.  Patient instructed to take today's dose.  Patient instructed to take 1 tablet by mouth once-daily at [facility] on all days of the week--EXCEPT on [facility], take 1 & 1/2 tablets of your '5mg'$  peach colored warfarin tablets on WEDNESDAYS. Patient verbalized understanding of these instructions.      Patient advised to contact clinic or seek medical attention if signs/symptoms of bleeding or thromboembolism occur.  Patient verbalized understanding by repeating back information and was advised to contact me if further medication-related questions arise. Patient was also provided an information handout.  Follow-up Return in 3 weeks (on 05/09/2017) for Follow up INR at 4:30PM.  Pennie Banter, PharmD, CACP, CPP  15 minutes spent face-to-face with the patient during the encounter. 50% of time spent on education. 50% of time was spent on fingerstick point of care INR sample collection, processing, results determination, dose change, and documentation in EPIC/CHL and [email or web address]

## 2017-04-18 NOTE — Progress Notes (Signed)
   CC: leg swelling  HPI:  [name] is a 83 y.o. with a PMH of HTN, T2DM, CKD stage 4, h/o DVTs with long-term anticoagulation presenting to clinic for evaluation of leg swelling.  Patient was seen in INR clinic last week due to concerns for leg edema; she was instructed to increase her furosemide to 80mg  BID. She states that her swelling has mildly improved, however she is still having significant swelling in her right leg. She denies pain, redness, chest pain, shortness of breath, dizziness. Echo from 01/2016 shows preserved EF, and G2DD.  Please see problem based Assessment and Plan for status of patients chronic conditions.  Past Medical History:  Diagnosis Date  . Anemia     normocytic anemia with baseline hemoglobin 10-11  . Blindness of left eye     likely related to stroke, left cataract removed from that eye with complications  . Calculus gallbladder and bile duct with cholecystitis with obstruction 01/2015  . Chronic kidney disease 2006    left renal artery stenosis with probable hemodynamic significance, kidneys are normal in morphology without focal lesions or hydronephrosis this is based but cannot A. of the abdomen with and without contrast done on the 31st 2006  . CKD (chronic kidney disease), stage III   . CVA (cerebral vascular accident) ([place]) 1990's  . CVA (cerebrovascular accident) [facility])  October 2007    CT of the head atrophy with multiple remote insults noted but no definite acute findings  . Glaucoma   . Gout   . Hyperlipidemia   . Hypertension   . Personal history of DVT (deep vein thrombosis) 08/11/2010   BLE  . Type II diabetes mellitus ([facility])     Review of Systems:   ROS Per HPI  Physical Exam:  Vitals:   04/18/17 1601  BP: (!) 141/54  Pulse: 66  Temp: 97.7 F (36.5 C)  TempSrc: Oral  SpO2: 93%  Weight: 140 lb 8 oz (63.7 kg)  Height: 5\' 2"  (1.575 m)   GENERAL- alert, co-operative, appears as stated age, not in any distress. HEENT-  Atraumatic, normocephalic, oral mucosa appears moist CARDIAC- RRR, systolic murmur best heard in RUSB, no rubs or gallops. RESP- Moving equal volumes of air, and clear to auscultation bilaterally, no wheezes or crackles. ABDOMEN- Soft, nontender, bowel sounds present. NEURO- No obvious Cr N abnormality. EXTREMITIES- warm, 3+ pitting edema in RLL to mid-tibia, 1-2+ pitting edema in LLE to mid-tibia.  SKIN- Warm, dry, no rash or lesion. PSYCH- Normal mood and affect, appropriate thought content and speech.  Assessment & Plan:   See Encounters Tab for problem based charting.   Patient seen with Dr. Nilsa Nutting, MD Internal Medicine PGY2

## 2017-04-18 NOTE — Patient Instructions (Signed)
Patient instructed to take medications as defined in the Anti-coagulation Track section of this encounter.  Patient instructed to take today's dose.  Patient instructed to take 1 tablet by mouth once-daily at [facility] on all days of the week--EXCEPT on [facility], take 1 & 1/2 tablets of your 5mg  peach colored warfarin tablets on  [facility]. Patient verbalized understanding of these instructions.

## 2017-04-19 LAB — BMP8+ANION GAP
Anion Gap: 15 mmol/L (ref 10.0–18.0)
BUN / CREAT RATIO: 12 (ref 12–28)
BUN: 29 mg/dL — AB (ref 8–27)
CO2: 28 mmol/L (ref 20–29)
CREATININE: 2.36 mg/dL — AB (ref 0.57–1.00)
Calcium: 10 mg/dL (ref 8.7–10.3)
Chloride: 98 mmol/L (ref 96–106)
GFR calc non Af Amer: 18 mL/min/{1.73_m2} — ABNORMAL LOW (ref >59)
GFR, EST AFRICAN AMERICAN: 21 mL/min/{1.73_m2} — AB (ref >59)
Glucose: 184 mg/dL — ABNORMAL HIGH (ref 65–99)
Potassium: 4.3 mmol/L (ref 3.5–5.2)
Sodium: 141 mmol/L (ref 134–144)

## 2017-04-19 NOTE — Progress Notes (Signed)
Internal Medicine Clinic Attending  I saw and evaluated the patient.  I personally confirmed the key portions of the history and exam documented by Dr. Svalina and I reviewed pertinent patient test results.  The assessment, diagnosis, and plan were formulated together and I agree with the documentation in the resident's note.  

## 2017-04-28 DIAGNOSIS — N2581 Secondary hyperparathyroidism of renal origin: Secondary | ICD-10-CM | POA: Diagnosis not present

## 2017-04-28 DIAGNOSIS — R809 Proteinuria, unspecified: Secondary | ICD-10-CM | POA: Diagnosis not present

## 2017-04-28 DIAGNOSIS — N184 Chronic kidney disease, stage 4 (severe): Secondary | ICD-10-CM | POA: Diagnosis not present

## 2017-04-28 DIAGNOSIS — R600 Localized edema: Secondary | ICD-10-CM | POA: Diagnosis not present

## 2017-04-28 DIAGNOSIS — I129 Hypertensive chronic kidney disease with stage 1 through stage 4 chronic kidney disease, or unspecified chronic kidney disease: Secondary | ICD-10-CM | POA: Diagnosis not present

## 2017-05-02 ENCOUNTER — Ambulatory Visit (INDEPENDENT_AMBULATORY_CARE_PROVIDER_SITE_OTHER): Payer: Medicare Other | Admitting: Internal Medicine

## 2017-05-02 ENCOUNTER — Encounter: Payer: Self-pay | Admitting: *Deleted

## 2017-05-02 ENCOUNTER — Ambulatory Visit (INDEPENDENT_AMBULATORY_CARE_PROVIDER_SITE_OTHER): Payer: Medicare Other | Admitting: Pharmacist

## 2017-05-02 VITALS — BP 132/56 | HR 65 | Temp 97.6°F | Ht 63.0 in | Wt 136.7 lb

## 2017-05-02 DIAGNOSIS — Z7901 Long term (current) use of anticoagulants: Secondary | ICD-10-CM | POA: Diagnosis not present

## 2017-05-02 DIAGNOSIS — N183 Chronic kidney disease, stage 3 (moderate): Secondary | ICD-10-CM

## 2017-05-02 DIAGNOSIS — Z86718 Personal history of other venous thrombosis and embolism: Secondary | ICD-10-CM

## 2017-05-02 DIAGNOSIS — E1159 Type 2 diabetes mellitus with other circulatory complications: Secondary | ICD-10-CM | POA: Diagnosis not present

## 2017-05-02 DIAGNOSIS — I152 Hypertension secondary to endocrine disorders: Secondary | ICD-10-CM

## 2017-05-02 DIAGNOSIS — I10 Essential (primary) hypertension: Principal | ICD-10-CM

## 2017-05-02 DIAGNOSIS — I129 Hypertensive chronic kidney disease with stage 1 through stage 4 chronic kidney disease, or unspecified chronic kidney disease: Secondary | ICD-10-CM | POA: Diagnosis not present

## 2017-05-02 DIAGNOSIS — R600 Localized edema: Secondary | ICD-10-CM

## 2017-05-02 DIAGNOSIS — E1122 Type 2 diabetes mellitus with diabetic chronic kidney disease: Secondary | ICD-10-CM

## 2017-05-02 DIAGNOSIS — E785 Hyperlipidemia, unspecified: Secondary | ICD-10-CM | POA: Diagnosis not present

## 2017-05-02 DIAGNOSIS — Z8673 Personal history of transient ischemic attack (TIA), and cerebral infarction without residual deficits: Secondary | ICD-10-CM

## 2017-05-02 LAB — POCT INR: INR: 2.2

## 2017-05-02 MED ORDER — HYDRALAZINE HCL 10 MG PO TABS: 20.0000 mg | ORAL_TABLET | Freq: Three times a day (TID) | ORAL | 3 refills | Status: DC

## 2017-05-02 NOTE — Assessment & Plan Note (Addendum)
The patient's bp during this visit was elevated at 188/68. Manual blood pressure measurements that were repeated showed 132/56.   Amlodipine was discontinued due to side effect of feet swelling. Hydralazine was increased to 20mg  tid

## 2017-05-02 NOTE — Assessment & Plan Note (Addendum)
The patient was seen in clinic on 04/18/17 for increased leg edema. She was told to take furosemide 80mg  bid and use compression stockings. The patient's cr=2.36 and BUN=21. The patient's last echo was in 02/06/16 which revealed left ventricular hypertrophy, normal ejection fraction of 60-65%, mild to moderately calcified annulus, and findings consistent with grade 2 diastolic dysfunction. The patient states that she has noted swelling in her feet previously and it swells intermittently, but is unable to recall how often.  The patient states that she is compliant with her medication, has been using compression stockings, and has been keeping her legs elevated. She feels that the swelling in her legs have decreased. No dizziness, headaches, no aches or pains in her bilateral lower extremities.   It is unlikely that the patient's foot swelling is due to chf based on review of the patient's last echo. It is unlikely that the patient has a dvt as the patient has been maintaining therapeutic INR. She does have an elevated microalbumin/creatinine ratio whichti indicates presence of proteinuria. The patient's diabetic nephropathy can be contributing to the patient's feet swelling. Additionally, the patient is currently on amlodipine 10mg  qd which has been known to cause feet swelling.   -Discontinue amlodipine 10mg  qd -Increased dose of hydralazine from 10mg  tid to 20mg  tid

## 2017-05-02 NOTE — Progress Notes (Signed)
   CC: Bilateral Leg Edema  HPI:  [name] is a 83 y.o. with pmh of CKD III, CVA, Hypertension, Gout, and Type II Diabetes Mellitus who is following up regarding bilateral leg edema. Please see assessment and plan for additional details.    Past Medical History:  Diagnosis Date  . Anemia     normocytic anemia with baseline hemoglobin 10-11  . Blindness of left eye     likely related to stroke, left cataract removed from that eye with complications  . Calculus gallbladder and bile duct with cholecystitis with obstruction 01/2015  . Chronic kidney disease 2006    left renal artery stenosis with probable hemodynamic significance, kidneys are normal in morphology without focal lesions or hydronephrosis this is based but cannot A. of the abdomen with and without contrast done on the 31st 2006  . CKD (chronic kidney disease), stage III   . CVA (cerebral vascular accident) ([place]) 1990's  . CVA (cerebrovascular accident) [facility])  October 2007    CT of the head atrophy with multiple remote insults noted but no definite acute findings  . Glaucoma   . Gout   . Hyperlipidemia   . Hypertension   . Personal history of DVT (deep vein thrombosis) 08/11/2010   BLE  . Type II diabetes mellitus ([place])    Review of Systems:    Review of Systems  Constitutional: Negative for chills and fever.  Respiratory: Negative for cough and shortness of breath.   Cardiovascular: Negative for chest pain, palpitations and orthopnea.  Gastrointestinal: Negative for abdominal pain, constipation, diarrhea, nausea and vomiting.  Genitourinary: Negative for dysuria and urgency.  Neurological: Negative for dizziness and headaches.  Psychiatric/Behavioral: Negative for depression.   Physical Exam:  There were no vitals filed for this visit. Physical Exam  Constitutional: She appears well-developed and well-nourished. No distress.  HENT:  Temporal wasting noted on head  Cardiovascular: Normal rate, regular  rhythm and normal heart sounds.   Pulmonary/Chest: Breath sounds normal. No respiratory distress. She has no wheezes.  Abdominal: Soft. Bowel sounds are normal. She exhibits no distension. There is no tenderness.  Psychiatric: She has a normal mood and affect. Her behavior is normal. Judgment and thought content normal.    Assessment & Plan:   See Encounters Tab for problem based charting.  Patient seen with Dr. Lynnae January

## 2017-05-02 NOTE — Patient Instructions (Addendum)
It was a pleasure to meet you today Chelsea Mcdaniel. During your visit we spoke about your foot swelling and ways to decrease it.   -Please stop taking amlodipine -Please increase hydralazine to 20mg  3 times a day (since you have 10mg  tablets at home please take two of those each time three times a day to total 6 tablets per day) -We have decreased furosemide dose from twice daily to once daily -follow up in 1 month   Thank you for allowing me to be a part of your care. I look forward to working with you closely to maintain good health.   Best,  [name], MD Internal Medicine PGY1

## 2017-05-02 NOTE — Progress Notes (Signed)
Anticoagulation Management Chelsea Mcdaniel is a 83 y.o. female who reports to the clinic for monitoring of warfarin treatment.    Indication: DVT , history of; long term use (current) of anticoagulants.  Duration: indefinite Supervising physician: [facility] Visit History: Patient does not report signs/symptoms of bleeding or thromboembolism. Other recent changes: No diet, medications, lifestyle changes endorsed by the patient.  Anticoagulation Episode Summary    Current INR goal:   2.0-3.0  TTR:   67.1 % (3.3 y)  Next INR check:   05/30/2017  INR from last check:   2.20 (05/02/2017)  Weekly max warfarin dose:     Target end date:     INR check location:     Preferred lab:     Send INR reminders to:      Indications   EMBOLISM/THROMBOSIS DEEP VSL LWR EXTRM NOS (Resolved) [I82.409] Long-term (current) use of anticoagulants [Z79.01]       Comments:           Allergies  Allergen Reactions  . Diltiazem Other (See Comments)    Symptomatic bradaycardia  . Metoprolol Other (See Comments)    Symptomatic bradycardia   Prior to Admission medications   Medication Sig Start Date End Date Taking? Authorizing Provider  ACCU-CHEK AVIVA PLUS test strip USE 1 TIME DAILY TO CHECK BLOOD SUGAR 02/18/16  Yes Riccardo Dubin, MD  ACCU-CHEK SOFTCLIX LANCETS lancets Used to check blood sugar 3 times daily. Dx code E11.8 11/01/16  Yes Riccardo Dubin, MD  allopurinol (ZYLOPRIM) 100 MG tablet Take 1.5 tablets (150 mg total) by mouth daily. 03/25/17 03/25/18 Yes [name], MD  Blood Glucose Monitoring Suppl (ACCU-CHEK AVIVA PLUS) w/Device KIT Check blood sugar 1 time a day 09/30/16  Yes Riccardo Dubin, MD  Calcium Carbonate-Vitamin D 600-400 MG-UNIT per tablet Take 1 tablet by mouth 2 (two) times daily.   Yes [provider]  cloNIDine (CATAPRES - DOSED IN MG/24 HR) 0.2 mg/24hr patch Place 1 patch (0.2 mg total) onto the skin every Thursday. 03/31/17  Yes  [name], MD  dorzolamide-timolol (COSOPT) 22.3-6.8 MG/ML ophthalmic solution Place 1 drop into both eyes 2 (two) times daily. 01/21/16  Yes [provider]  furosemide (LASIX) 10 MG/ML solution Take 80 mg by mouth once.   Yes [provider]  GNP STOOL SOFTENER/LAXATIVE 8.6-50 MG tablet TAKE 2 TABLETS BY MOUTH AT BEDTIME AS NEEDED FOR CONSTIPATION 07/16/16  Yes Sid Falcon, MD  hydrALAZINE (APRESOLINE) 10 MG tablet Take 2 tablets (20 mg total) by mouth 3 (three) times daily. 05/02/17  Yes Chundi, [name], MD  JANUVIA 25 MG tablet TAKE 1 TABLET BY MOUTH EVERY DAY 08/23/16  Yes Patel, Rushil V, MD  LUMIGAN 0.01 % SOLN Place 1 drop into both eyes every morning.  12/05/13  Yes [provider]  polyethylene glycol (MIRALAX) packet Take 17 g by mouth 2 (two) times daily. 03/25/17  Yes [name], MD  polyethylene glycol powder (GLYCOLAX/MIRALAX) powder Take 17 g by mouth 2 (two) times daily. 03/25/17  Yes Bartholomew Crews, MD  warfarin (COUMADIN) 5 MG tablet Take 1.5 tablets on Sundays, Tuesdays and Thursdays. All other days, take only 1 tablet. 11/29/16  Yes Oval Linsey, MD   Past Medical History:  Diagnosis Date  . Anemia     normocytic anemia with baseline hemoglobin 10-11  . Blindness of left eye     likely related to stroke, left cataract removed from  that eye with complications  . Calculus gallbladder and bile duct with cholecystitis with obstruction 01/2015  . Chronic kidney disease 2006    left renal artery stenosis with probable hemodynamic significance, kidneys are normal in morphology without focal lesions or hydronephrosis this is based but cannot A. of the abdomen with and without contrast done on the 31st 2006  . CKD (chronic kidney disease), stage III ([place])   . CVA (cerebral vascular accident) ([place]) 1990's  . CVA (cerebrovascular accident) [facility])  October 2007    CT of the head atrophy with multiple remote insults noted but no  definite acute findings  . Glaucoma   . Gout   . Hyperlipidemia   . Hypertension   . Personal history of DVT (deep vein thrombosis) 08/11/2010   BLE  . Type II diabetes mellitus ([place])    Social History   Social History  . Marital status: Widowed    Spouse name: N/A  . Number of children: N/A  . Years of education: N/A   Social History Main Topics  . Smoking status: Never Smoker  . Smokeless tobacco: Never Used  . Alcohol use No  . Drug use: No  . Sexual activity: No   Other Topics Concern  . Not on file   Social History Narrative    Patient is a widow. She has 11 children 5 of who are living. She is retired in 1993 from [employer]. She denies tobacco alcohol or drug use.   Family History  Problem Relation Age of Onset  . Heart disease Mother   . Diabetes Mother   . Hyperlipidemia Mother   . Hypertension Mother   . Heart disease Father   . Diabetes Father   . Hyperlipidemia Father   . Hypertension Father     ASSESSMENT Recent Results: The most recent result is correlated with 37.5 mg per week: Lab Results  Component Value Date   INR 2.20 05/02/2017   INR 2.80 04/18/2017   INR 2.0 04/11/2017    Anticoagulation Dosing: INR as of 05/02/2017 and Previous Warfarin Dosing Information    INR Dt INR Goal Wkly Tot Sun Mon Tue Wed Thu Fri Sat   05/02/2017 2.20 2.0-3.0 37.5 mg 5 mg 5 mg 5 mg 7.5 mg 5 mg 5 mg 5 mg    Previous description   Take 1 tablet by mouth once-daily at 6PM on all days of the week--EXCEPT on [facility], take 1 & 1/2 tablets of your 16m peach colored warfarin tablets on WEDNESDAYS.     Anticoagulation Warfarin Dose Instructions as of 05/02/2017      Total Sun Mon Tue Wed Thu Fri Sat   New Dose 42.5 mg 5 mg 7.5 mg 5 mg 7.5 mg 5 mg 7.5 mg 5 mg     (5 mg x 1)  (5 mg x 1.5)  (5 mg x 1)  (5 mg x 1.5)  (5 mg x 1)  (5 mg x 1.5)  (5 mg x 1)                         Description   [facility]--EXCEPT [facility] MONDAYS, [facility] FRIDA[facility], take 1 & 1/2 tablets of your 554mpeach colored warfarin tablets on Mondays, Wednesdays and Fridays.       INR today: Therapeutic  PLAN Weekly dose was increased by 6% to 4034mer week  Patient Instructions  Patient instructed to take medications as defined in the Anti-coagulation Track section of this encounter.  Patient instructed to take today's dose. Patient instructed to take 1 tablet by mouth once-daily at [facility] on all days of the week--EXCEPT on MONDAYS, [facility] and FRIDAYS, take 1 & 1/2 tablets of your 35m peach colored warfarin tablets on Mondays, Wednesdays and Fridays.  Patient verbalized understanding of these instructions.     Patient advised to contact clinic or seek medical attention if signs/symptoms of bleeding or thromboembolism occur.  Patient verbalized understanding by repeating back information and was advised to contact me if further medication-related questions arise. Patient was also provided an information handout.  Follow-up Return in 4 weeks (on 05/30/2017) for Follow up INR at [place] PharmD, CACP, CPP  15 minutes spent face-to-face with the patient during the encounter. 50% of time spent on education. 50% of time was spent on fingerstick point of care INR sample collection, processing, results determination, dose adjustment, documentation in EPIC/CHL and [email or web address]

## 2017-05-02 NOTE — Patient Instructions (Signed)
Patient instructed to take medications as defined in the Anti-coagulation Track section of this encounter.  Patient instructed to take today's dose. Patient instructed to take 1 tablet by mouth once-daily at [facility] on all days of the week--EXCEPT on MONDAYS, [facility] and FRIDAYS, take 1 & 1/2 tablets of your 5mg  peach colored warfarin tablets on Mondays, Wednesdays and Fridays.  Patient verbalized understanding of these instructions.

## 2017-05-05 NOTE — Progress Notes (Signed)
Internal Medicine Clinic Attending  I saw and evaluated the patient.  I personally confirmed the key portions of the history and exam documented by Dr. Chundi and I reviewed pertinent patient test results.  The assessment, diagnosis, and plan were formulated together and I agree with the documentation in the resident's note. 

## 2017-05-09 ENCOUNTER — Ambulatory Visit: Payer: Self-pay

## 2017-05-09 ENCOUNTER — Other Ambulatory Visit: Payer: Self-pay | Admitting: Internal Medicine

## 2017-05-13 ENCOUNTER — Other Ambulatory Visit: Payer: Self-pay | Admitting: Student in an Organized Health Care Education/Training Program

## 2017-05-14 NOTE — Telephone Encounter (Signed)
Will assess whether needs refill at next appt scheduled 06/10/17

## 2017-05-23 NOTE — Telephone Encounter (Signed)
Looks like this medication was already filled by pcp. Thanks!

## 2017-05-25 DIAGNOSIS — N183 Chronic kidney disease, stage 3 (moderate): Secondary | ICD-10-CM | POA: Diagnosis not present

## 2017-05-30 ENCOUNTER — Ambulatory Visit (INDEPENDENT_AMBULATORY_CARE_PROVIDER_SITE_OTHER): Payer: Medicare Other | Admitting: Pharmacist

## 2017-05-30 DIAGNOSIS — Z86718 Personal history of other venous thrombosis and embolism: Secondary | ICD-10-CM | POA: Diagnosis not present

## 2017-05-30 DIAGNOSIS — Z7901 Long term (current) use of anticoagulants: Secondary | ICD-10-CM | POA: Diagnosis not present

## 2017-05-30 DIAGNOSIS — Z8673 Personal history of transient ischemic attack (TIA), and cerebral infarction without residual deficits: Secondary | ICD-10-CM

## 2017-05-30 DIAGNOSIS — I6930 Unspecified sequelae of cerebral infarction: Secondary | ICD-10-CM

## 2017-05-30 LAB — POCT INR: INR: 3

## 2017-05-30 NOTE — Patient Instructions (Signed)
Patient instructed to take medications as defined in the Anti-coagulation Track section of this encounter.  Patient instructed to take today's dose.  Patient instructed to take 1 tablet by mouth once-daily at [facility] on all days of the week--EXCEPT on [facility] --take 1 & 1/2 tablets of your 5mg  peach colored warfarin tablets on Wednesdays.   Patient verbalized understanding of these instructions.

## 2017-05-30 NOTE — Progress Notes (Signed)
Anticoagulation Management Chelsea Mcdaniel is a 83 y.o. female who reports to the clinic for monitoring of warfarin treatment.    Indication: DVT , embolism with thrombosis, Deep VSL Lower extremity NOS (resolved) [182.409]; long term (current) use of anticoagulants [Z79.01].  Duration: indefinite Supervising physician: Gilles Chiquito  Anticoagulation Clinic Visit History: Patient does not report signs/symptoms of bleeding or thromboembolism  Other recent changes: No diet, medications, lifestyle changes endorsed by the patient to me.  Anticoagulation Episode Summary    Current INR goal:   2.0-3.0  TTR:   67.9 % (3.4 y)  Next INR check:   06/20/2017  INR from last check:     Weekly max warfarin dose:     Target end date:     INR check location:     Preferred lab:     Send INR reminders to:      Indications   EMBOLISM/THROMBOSIS DEEP VSL LWR EXTRM NOS (Resolved) [I82.409] Long-term (current) use of anticoagulants [Z79.01]       Comments:           Allergies  Allergen Reactions  . Diltiazem Other (See Comments)    Symptomatic bradaycardia  . Metoprolol Other (See Comments)    Symptomatic bradycardia   Prior to Admission medications   Medication Sig Start Date End Date Taking? Authorizing Provider  ACCU-CHEK AVIVA PLUS test strip USE 1 TIME DAILY TO CHECK BLOOD SUGAR 02/18/16  Yes Riccardo Dubin, MD  ACCU-CHEK SOFTCLIX LANCETS lancets Used to check blood sugar 3 times daily. Dx code E11.8 11/01/16  Yes Riccardo Dubin, MD  allopurinol (ZYLOPRIM) 100 MG tablet Take 1.5 tablets (150 mg total) by mouth daily. 03/25/17 03/25/18 Yes [name], MD  Blood Glucose Monitoring Suppl (ACCU-CHEK AVIVA PLUS) w/Device KIT Check blood sugar 1 time a day 09/30/16  Yes Riccardo Dubin, MD  Calcium Carbonate-Vitamin D 600-400 MG-UNIT per tablet Take 1 tablet by mouth 2 (two) times daily.   Yes [provider]  cloNIDine (CATAPRES - DOSED IN MG/24 HR) 0.2 mg/24hr patch Place 1  patch (0.2 mg total) onto the skin every Thursday. 03/31/17  Yes [name], MD  dorzolamide-timolol (COSOPT) 22.3-6.8 MG/ML ophthalmic solution Place 1 drop into both eyes 2 (two) times daily. 01/21/16  Yes [provider]  furosemide (LASIX) 10 MG/ML solution Take 80 mg by mouth once.   Yes [provider]  GNP STOOL SOFTENER/LAXATIVE 8.6-50 MG tablet TAKE 2 TABLETS BY MOUTH AT BEDTIME AS NEEDED FOR CONSTIPATION 07/16/16  Yes Sid Falcon, MD  hydrALAZINE (APRESOLINE) 10 MG tablet Take 2 tablets (20 mg total) by mouth 3 (three) times daily. 05/02/17  Yes Chundi, [name], MD  JANUVIA 25 MG tablet TAKE 1 TABLET BY MOUTH EVERY DAY 08/23/16  Yes Patel, Rushil V, MD  LUMIGAN 0.01 % SOLN Place 1 drop into both eyes every morning.  12/05/13  Yes [provider]  polyethylene glycol (MIRALAX) packet Take 17 g by mouth 2 (two) times daily. 03/25/17  Yes [name], MD  polyethylene glycol powder (GLYCOLAX/MIRALAX) powder Take 17 g by mouth 2 (two) times daily. 03/25/17  Yes Bartholomew Crews, MD  warfarin (COUMADIN) 5 MG tablet Take 1.5 tablets on Sundays, Tuesdays and Thursdays. All other days, take only 1 tablet. 11/29/16  Yes Oval Linsey, MD   Past Medical History:  Diagnosis Date  . Anemia     normocytic anemia with baseline hemoglobin 10-11  . Blindness of left eye  likely related to stroke, left cataract removed from that eye with complications  . Calculus gallbladder and bile duct with cholecystitis with obstruction 01/2015  . Chronic kidney disease 2006    left renal artery stenosis with probable hemodynamic significance, kidneys are normal in morphology without focal lesions or hydronephrosis this is based but cannot A. of the abdomen with and without contrast done on the 31st 2006  . CKD (chronic kidney disease), stage III ([place])   . CVA (cerebral vascular accident) ([place]) 1990's  . CVA (cerebrovascular accident) [facility])  October 2007    CT of the  head atrophy with multiple remote insults noted but no definite acute findings  . Glaucoma   . Gout   . Hyperlipidemia   . Hypertension   . Personal history of DVT (deep vein thrombosis) 08/11/2010   BLE  . Type II diabetes mellitus ([place])    Social History   Socioeconomic History  . Marital status: Widowed    Spouse name: Not on file  . Number of children: Not on file  . Years of education: Not on file  . Highest education level: Not on file  Social Needs  . Financial resource strain: Not on file  . Food insecurity - worry: Not on file  . Food insecurity - inability: Not on file  . Transportation needs - medical: Not on file  . Transportation needs - non-medical: Not on file  Occupational History  . Not on file  Tobacco Use  . Smoking status: Never Smoker  . Smokeless tobacco: Never Used  Substance and Sexual Activity  . Alcohol use: No    Alcohol/week: 0.0 oz  . Drug use: No  . Sexual activity: No  Other Topics Concern  . Not on file  Social History Narrative    Patient is a widow. She has 11 children 5 of who are living. She is retired in 1993 from [employer]. She denies tobacco alcohol or drug use.   Family History  Problem Relation Age of Onset  . Heart disease Mother   . Diabetes Mother   . Hyperlipidemia Mother   . Hypertension Mother   . Heart disease Father   . Diabetes Father   . Hyperlipidemia Father   . Hypertension Father     ASSESSMENT Recent Results: The most recent result is correlated with 40 mg per week: Lab Results  Component Value Date   INR 3.00 05/30/2017   INR 2.20 05/02/2017   INR 2.80 04/18/2017    Anticoagulation Dosing: Description   Take 1 tablet by mouth once-daily at 6PM on all days of the week--EXCEPT on [facility] --take 1 & 1/2 tablets of your '5mg'$  peach colored warfarin tablets on Wednesdays.       INR today: Therapeutic  PLAN Weekly dose was decreased by 6% to 37.5 mg per week  Patient Instructions  Patient  instructed to take medications as defined in the Anti-coagulation Track section of this encounter.  Patient instructed to take today's dose.  Patient instructed to take 1 tablet by mouth once-daily at [facility] on all days of the week--EXCEPT on [facility] --take 1 & 1/2 tablets of your '5mg'$  peach colored warfarin tablets on Wednesdays.   Patient verbalized understanding of these instructions.     Patient advised to contact clinic or seek medical attention if signs/symptoms of bleeding or thromboembolism occur.  Patient verbalized understanding by repeating back information and was advised to contact me if further medication-related questions arise. Patient was also provided an  information handout.  Follow-up Return in about 3 weeks (around 06/20/2017) for Follow up INR at 3:45PM.  Pennie Banter, PharmD, CACP, CPP  15 minutes spent face-to-face with the patient during the encounter. 50% of time spent on education. 50% of time was spent on fingerstick point of care INR sample collection, processing, results interpretation and dose adjustment; documentation in Epic/CHL and [email or web address]

## 2017-06-01 NOTE — Progress Notes (Signed)
I have reviewed Dr. Gladstone Pih note.  Patient is on [facility] for VTE.  INR 3 and coumadin decreased.

## 2017-06-07 ENCOUNTER — Other Ambulatory Visit: Payer: Self-pay | Admitting: *Deleted

## 2017-06-07 DIAGNOSIS — E118 Type 2 diabetes mellitus with unspecified complications: Secondary | ICD-10-CM

## 2017-06-08 MED ORDER — ACCU-CHEK SOFTCLIX LANCETS MISC: 6 refills | Status: DC

## 2017-06-08 NOTE — Telephone Encounter (Signed)
Refilled accu check softclix lancets 06/08/17

## 2017-06-10 ENCOUNTER — Encounter: Payer: Self-pay | Admitting: Internal Medicine

## 2017-06-13 ENCOUNTER — Other Ambulatory Visit: Payer: Self-pay | Admitting: Student in an Organized Health Care Education/Training Program

## 2017-06-13 DIAGNOSIS — I10 Essential (primary) hypertension: Principal | ICD-10-CM

## 2017-06-13 DIAGNOSIS — E1159 Type 2 diabetes mellitus with other circulatory complications: Secondary | ICD-10-CM

## 2017-06-20 ENCOUNTER — Ambulatory Visit (INDEPENDENT_AMBULATORY_CARE_PROVIDER_SITE_OTHER): Payer: Medicare Other | Admitting: Pharmacist

## 2017-06-20 ENCOUNTER — Encounter: Payer: Self-pay | Admitting: Internal Medicine

## 2017-06-20 ENCOUNTER — Ambulatory Visit (INDEPENDENT_AMBULATORY_CARE_PROVIDER_SITE_OTHER): Payer: Medicare Other | Admitting: Internal Medicine

## 2017-06-20 ENCOUNTER — Other Ambulatory Visit: Payer: Self-pay

## 2017-06-20 VITALS — BP 158/76 | HR 65 | Temp 98.1°F | Ht 63.0 in | Wt 137.5 lb

## 2017-06-20 DIAGNOSIS — Z79899 Other long term (current) drug therapy: Secondary | ICD-10-CM | POA: Diagnosis not present

## 2017-06-20 DIAGNOSIS — H547 Unspecified visual loss: Secondary | ICD-10-CM

## 2017-06-20 DIAGNOSIS — I152 Hypertension secondary to endocrine disorders: Secondary | ICD-10-CM

## 2017-06-20 DIAGNOSIS — R600 Localized edema: Secondary | ICD-10-CM

## 2017-06-20 DIAGNOSIS — Z7901 Long term (current) use of anticoagulants: Secondary | ICD-10-CM | POA: Diagnosis not present

## 2017-06-20 DIAGNOSIS — E1159 Type 2 diabetes mellitus with other circulatory complications: Secondary | ICD-10-CM

## 2017-06-20 DIAGNOSIS — Z86718 Personal history of other venous thrombosis and embolism: Secondary | ICD-10-CM

## 2017-06-20 DIAGNOSIS — R439 Unspecified disturbances of smell and taste: Secondary | ICD-10-CM

## 2017-06-20 DIAGNOSIS — I10 Essential (primary) hypertension: Principal | ICD-10-CM

## 2017-06-20 LAB — POCT INR: INR: 2.2

## 2017-06-20 MED ORDER — CLONIDINE 0.3 MG/24HR TD PTWK: 0.3000 mg | MEDICATED_PATCH | TRANSDERMAL | 12 refills | Status: DC

## 2017-06-20 NOTE — Progress Notes (Signed)
I reviewed Dr. Gladstone Pih note.  Patient is on [facility] for VTE and INR at the low end of range, so coumadin dose increased.

## 2017-06-20 NOTE — Progress Notes (Signed)
Anticoagulation Management Chelsea Mcdaniel is a 83 y.o. female who reports to the clinic for monitoring of warfarin treatment.    Indication: Embolism/Thrombosis, Deep VSL LWR EXTRM NOS (Resolved) [182.409]; long term(current) use of anticoagulants.  Duration: indefinite Supervising physician: Gilles Chiquito  Anticoagulation Clinic Visit History: Patient does not report signs/symptoms of bleeding or thromboembolism  Other recent changes: No diet, medications, lifestyle changes endorsed by the patient to me.  Anticoagulation Episode Summary    Current INR goal:   2.0-3.0  TTR:   68.4 % (3.4 y)  Next INR check:   07/11/2017  INR from last check:   2.20 (06/20/2017)  Weekly max warfarin dose:     Target end date:     INR check location:     Preferred lab:     Send INR reminders to:      Indications   EMBOLISM/THROMBOSIS DEEP VSL LWR EXTRM NOS (Resolved) [I82.409] Long-term (current) use of anticoagulants [Z79.01]       Comments:           Allergies  Allergen Reactions  . Diltiazem Other (See Comments)    Symptomatic bradaycardia  . Metoprolol Other (See Comments)    Symptomatic bradycardia   Prior to Admission medications   Medication Sig Start Date End Date Taking? Authorizing Provider  ACCU-CHEK AVIVA PLUS test strip USE 1 TIME DAILY TO CHECK BLOOD SUGAR 02/18/16  Yes Riccardo Dubin, MD  ACCU-CHEK SOFTCLIX LANCETS lancets Used to check blood sugar 3 times daily. Dx code E11.8 06/08/17  Yes Chundi, Verne Spurr, MD  allopurinol (ZYLOPRIM) 100 MG tablet Take 1.5 tablets (150 mg total) by mouth daily. 03/25/17 03/25/18 Yes [name], MD  Blood Glucose Monitoring Suppl (ACCU-CHEK AVIVA PLUS) w/Device KIT Check blood sugar 1 time a day 09/30/16  Yes Riccardo Dubin, MD  Calcium Carbonate-Vitamin D 600-400 MG-UNIT per tablet Take 1 tablet by mouth 2 (two) times daily.   Yes [provider]  cloNIDine (CATAPRES - DOSED IN MG/24 HR) 0.2 mg/24hr patch Place 1 patch (0.2 mg  total) onto the skin every Thursday. 03/31/17  Yes [name], MD  dorzolamide-timolol (COSOPT) 22.3-6.8 MG/ML ophthalmic solution Place 1 drop into both eyes 2 (two) times daily. 01/21/16  Yes [provider]  furosemide (LASIX) 10 MG/ML solution Take 80 mg by mouth once.   Yes [provider]  GNP STOOL SOFTENER/LAXATIVE 8.6-50 MG tablet TAKE 2 TABLETS BY MOUTH AT BEDTIME AS NEEDED FOR CONSTIPATION 07/16/16  Yes Sid Falcon, MD  hydrALAZINE (APRESOLINE) 10 MG tablet Take 2 tablets (20 mg total) by mouth 3 (three) times daily. 05/02/17  Yes Chundi, [name], MD  JANUVIA 25 MG tablet TAKE 1 TABLET BY MOUTH EVERY DAY 08/23/16  Yes Patel, Rushil V, MD  LUMIGAN 0.01 % SOLN Place 1 drop into both eyes every morning.  12/05/13  Yes [provider]  polyethylene glycol (MIRALAX) packet Take 17 g by mouth 2 (two) times daily. 03/25/17  Yes [name], MD  polyethylene glycol powder (GLYCOLAX/MIRALAX) powder Take 17 g by mouth 2 (two) times daily. 03/25/17  Yes Bartholomew Crews, MD  warfarin (COUMADIN) 5 MG tablet Take 1.5 tablets on Sundays, Tuesdays and Thursdays. All other days, take only 1 tablet. 11/29/16  Yes Oval Linsey, MD   Past Medical History:  Diagnosis Date  . Anemia     normocytic anemia with baseline hemoglobin 10-11  . Blindness of left eye     likely related to  stroke, left cataract removed from that eye with complications  . Calculus gallbladder and bile duct with cholecystitis with obstruction 01/2015  . Chronic kidney disease 2006    left renal artery stenosis with probable hemodynamic significance, kidneys are normal in morphology without focal lesions or hydronephrosis this is based but cannot A. of the abdomen with and without contrast done on the 31st 2006  . CKD (chronic kidney disease), stage III ([place])   . CVA (cerebral vascular accident) ([place]) 1990's  . CVA (cerebrovascular accident) [facility])  October 2007    CT of the head atrophy  with multiple remote insults noted but no definite acute findings  . Glaucoma   . Gout   . Hyperlipidemia   . Hypertension   . Personal history of DVT (deep vein thrombosis) 08/11/2010   BLE  . Type II diabetes mellitus ([place])    Social History   Socioeconomic History  . Marital status: Widowed    Spouse name: Not on file  . Number of children: Not on file  . Years of education: Not on file  . Highest education level: Not on file  Social Needs  . Financial resource strain: Not on file  . Food insecurity - worry: Not on file  . Food insecurity - inability: Not on file  . Transportation needs - medical: Not on file  . Transportation needs - non-medical: Not on file  Occupational History  . Not on file  Tobacco Use  . Smoking status: Never Smoker  . Smokeless tobacco: Never Used  Substance and Sexual Activity  . Alcohol use: No    Alcohol/week: 0.0 oz  . Drug use: No  . Sexual activity: No  Other Topics Concern  . Not on file  Social History Narrative    Patient is a widow. She has 11 children 5 of who are living. She is retired in 1993 from [employer]. She denies tobacco alcohol or drug use.   Family History  Problem Relation Age of Onset  . Heart disease Mother   . Diabetes Mother   . Hyperlipidemia Mother   . Hypertension Mother   . Heart disease Father   . Diabetes Father   . Hyperlipidemia Father   . Hypertension Father     ASSESSMENT Recent Results: The most recent result is correlated with 37.5 mg per week: Lab Results  Component Value Date   INR 2.20 06/20/2017   INR 3.00 05/30/2017   INR 2.20 05/02/2017    Anticoagulation Dosing: Description   Take 1 tablet by mouth once-daily at 6PM on all days of the week--EXCEPT on Mondays and Thursdays--take 1 & 1/2 tablets of your 42m peach colored warfarin tablets on Mondays and Thursdays.        INR today: Therapeutic  PLAN Weekly dose was increased by 7% to 40 mg per week  Patient Instructions   Patient instructed to take medications as defined in the Anti-coagulation Track section of this encounter.  Patient instructed to take today's dose.  Patient instructed to take  [facility]--EXCEPT on Mondays and Thursdays--take 1 & 1/2 tablets of your 527mpeach colored warfarin tablets on Mondays and Thursdays.   Patient verbalized understanding of these instructions.     Patient advised to contact clinic or seek medical attention if signs/symptoms of bleeding or thromboembolism occur.  Patient verbalized understanding by repeating back information and was advised to contact me if further medication-related questions arise.  Patient was also provided an information handout.  Follow-up Return in 3 weeks (on 07/11/2017) for Follow up INR at 3:30PM.  Pennie Banter, PharmD, CACP, CPP  15 minutes spent face-to-face with the patient during the encounter. 50% of time spent on education. 50% of time was spent on fingerstick point of care INR sample collection, processing, results determination, dose adjustment and documentation in [email or web address].

## 2017-06-20 NOTE — Progress Notes (Signed)
   CC: Follow up for hypertension  HPI:  [name] is a 83 y.o. female with PMHx detailed below presenting with chronic hypertension.  See problem based assessment and plan below for additional details.  Hypertension associated with diabetes ([place]) She is feeling well today with interval improvement of her ankle edema after stopping amlodipine. She has fairly poor knowledge of her treatments and her daughter in law is the primary caregiver. She arranges medications to a pill box and changes her clonidine patch weekly. A home blood pressure log demonstrates variability in measurements from 140s to 180s SBP, there are no low or normal values. She has no overt symptoms of hypertensive urgency such as vision changes, headache, chest pain, dizziness. Assessment Hypetension, uncontrolled with goal <150/90 She has stopped amlodipine but is probably taking clonidine, hydralazine, and lasix as directed Plan Increase clonidine to 0.3mg /24hr TD weekly RTC in about 2-4 weeks for recheck I encouraged continuing to record daily BP readings    Past Medical History:  Diagnosis Date  . Anemia     normocytic anemia with baseline hemoglobin 10-11  . Blindness of left eye     likely related to stroke, left cataract removed from that eye with complications  . Calculus gallbladder and bile duct with cholecystitis with obstruction 01/2015  . Chronic kidney disease 2006    left renal artery stenosis with probable hemodynamic significance, kidneys are normal in morphology without focal lesions or hydronephrosis this is based but cannot A. of the abdomen with and without contrast done on the 31st 2006  . CKD (chronic kidney disease), stage III ([place])   . CVA (cerebral vascular accident) ([place]) 1990's  . CVA (cerebrovascular accident) [facility])  October 2007    CT of the head atrophy with multiple remote insults noted but no definite acute findings  . Glaucoma   . Gout   . Hyperlipidemia   . Hypertension    . Personal history of DVT (deep vein thrombosis) 08/11/2010   BLE  . Type II diabetes mellitus ([place])     Review of Systems: Review of Systems  HENT: Negative for congestion and sinus pain.        Diminished sense of smell  Cardiovascular: Positive for leg swelling. Negative for chest pain.  Musculoskeletal: Negative for falls.  Neurological: Negative for dizziness and weakness.     Physical Exam: Vitals:   06/20/17 1601 06/20/17 1605  BP: (!) 182/73 (!) 158/76  Pulse: 67 65  Temp: 98.1 F (36.7 C)   TempSrc: Oral   SpO2: 100%   Weight: 137 lb 8 oz (62.4 kg)   Height: 5\' 3"  (1.6 m)    GENERAL- alert, co-operative, NAD HEENT- Oral mucosa appears moist, grossly impaired vision CARDIAC- RRR, no murmurs, rubs or gallops. RESP- CTAB, no wheezes or crackles. EXTREMITIES- 1+ pitting edema at the ankles R>L SKIN- Warm, dry, No rash or lesion. PSYCH- Normal mood and affect, appropriate thought content and speech.    Assessment & Plan:   See encounters tab for problem based medical decision making.   Patient discussed with Dr. Daryll Drown

## 2017-06-20 NOTE — Patient Instructions (Signed)
Patient instructed to take medications as defined in the Anti-coagulation Track section of this encounter.  Patient instructed to take today's dose.  Patient instructed to take  1 tablet by mouth once-daily at [facility] on all days of the week--EXCEPT on Mondays and Thursdays--take 1 & 1/2 tablets of your 5mg  peach colored warfarin tablets on Mondays and Thursdays.   Patient verbalized understanding of these instructions.

## 2017-06-20 NOTE — Patient Instructions (Signed)
FOLLOW-UP INSTRUCTIONS When: 2-4 weeks For: Blood pressure What to bring: Home logs  I am inreasing your dose of the clonidine patch today. You should start the new higher dose 0.3mg /24hrs immediately. We need to see you again within the next few weeks and recheck our progress.

## 2017-06-21 DIAGNOSIS — H338 Other retinal detachments: Secondary | ICD-10-CM | POA: Diagnosis not present

## 2017-06-21 DIAGNOSIS — H401113 Primary open-angle glaucoma, right eye, severe stage: Secondary | ICD-10-CM | POA: Diagnosis not present

## 2017-06-21 DIAGNOSIS — H2702 Aphakia, left eye: Secondary | ICD-10-CM | POA: Diagnosis not present

## 2017-06-21 DIAGNOSIS — H2511 Age-related nuclear cataract, right eye: Secondary | ICD-10-CM | POA: Diagnosis not present

## 2017-06-21 NOTE — Progress Notes (Signed)
Internal Medicine Clinic Attending  Case discussed with Dr. Rice at the time of the visit.  We reviewed the resident's history and exam and pertinent patient test results.  I agree with the assessment, diagnosis, and plan of care documented in the resident's note.  

## 2017-06-21 NOTE — Assessment & Plan Note (Addendum)
She is feeling well today with interval improvement of her ankle edema after stopping amlodipine. She has fairly poor knowledge of her treatments and her daughter in law is the primary caregiver. She arranges medications to a pill box and changes her clonidine patch weekly. A home blood pressure log demonstrates variability in measurements from 140s to 180s SBP, there are no low or normal values. She has no overt symptoms of hypertensive urgency such as vision changes, headache, chest pain, dizziness. Assessment Hypetension, uncontrolled with goal <150/90 She has stopped amlodipine but is probably taking clonidine, hydralazine, and lasix as directed Plan Increase clonidine to 0.3mg /24hr TD weekly RTC in about 2-4 weeks for recheck I encouraged continuing to record daily BP readings

## 2017-06-22 ENCOUNTER — Telehealth: Payer: Self-pay | Admitting: Emergency Medicine

## 2017-07-07 ENCOUNTER — Other Ambulatory Visit: Payer: Self-pay | Admitting: Student in an Organized Health Care Education/Training Program

## 2017-07-10 IMAGING — RF DG ESOPHAGUS
17 series · 17 of 17 positions shown · non-contrast
Comparison: None.

CLINICAL DATA: Dysphagia.  Difficulty swallowing pills.

EXAM:
ESOPHOGRAM/BARIUM SWALLOW
TECHNIQUE: Combined double contrast and single contrast examination performed
using effervescent crystals, thick barium liquid, and thin barium
liquid.
FLUOROSCOPY TIME:  Radiation Exposure Index (as provided by the
fluoroscopic device): 46 Gy cm2
If the device does not provide the exposure index:
Fluoroscopy Time:  2 minutes 12 seconds
Number of Acquired Images:  17

[Series 1: run · 1 of 1 slices shown (1 of 17)]
[im 1/1]
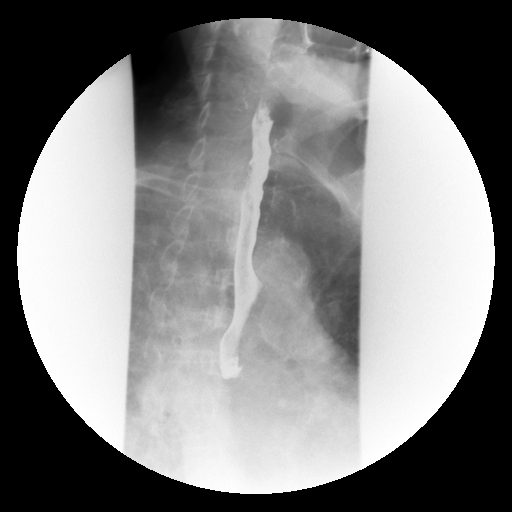

[Series 2: run · 1 of 1 slices shown (2 of 17)]
[im 1/1]
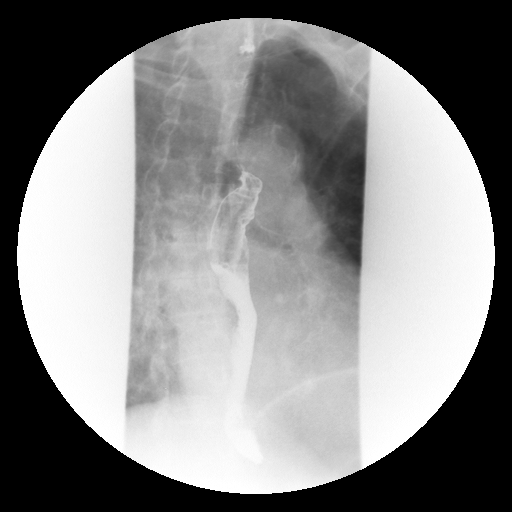

[Series 3: run · 1 of 1 slices shown (3 of 17)]
[im 1/1]
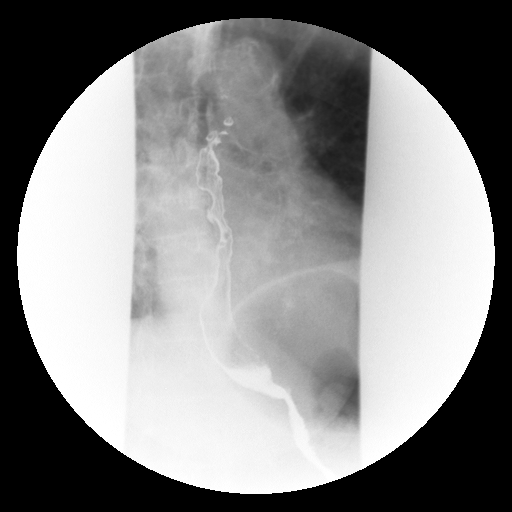

[Series 4: run · 1 of 1 slices shown (4 of 17)]
[im 1/1]
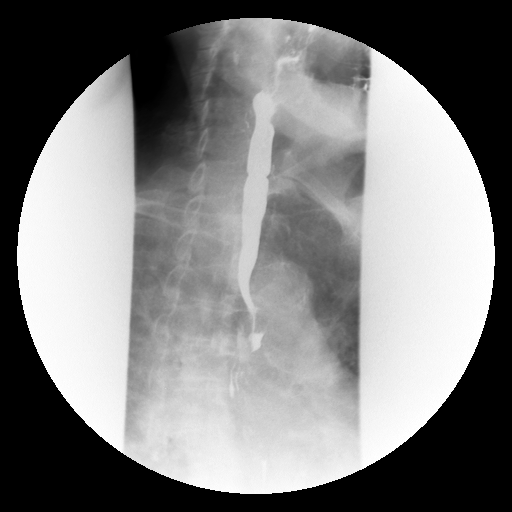

[Series 5: run · 1 of 1 slices shown (5 of 17)]
[im 1/1]
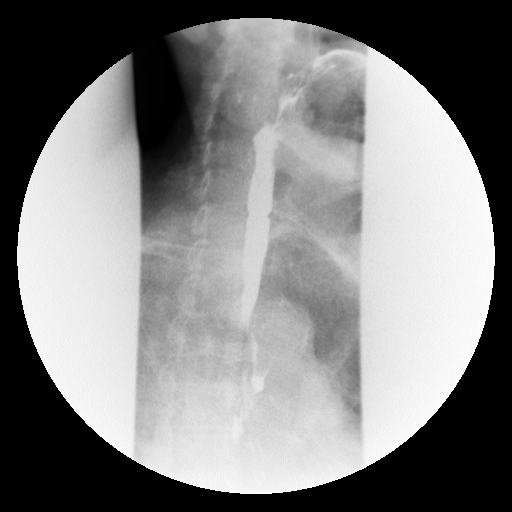

[Series 6: run · 1 of 1 slices shown (6 of 17)]
[im 1/1]
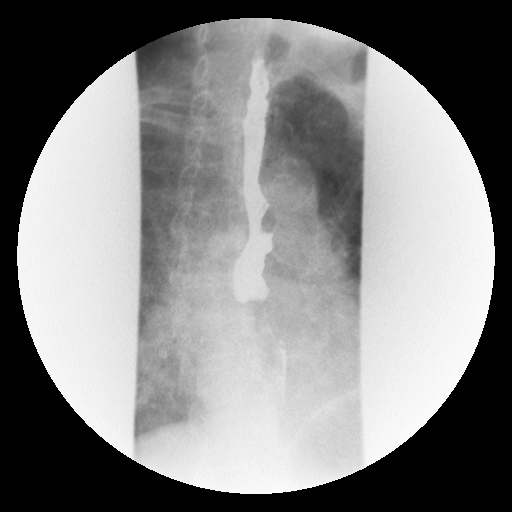

[Series 7: run · 1 of 1 slices shown (7 of 17)]
[im 1/1]
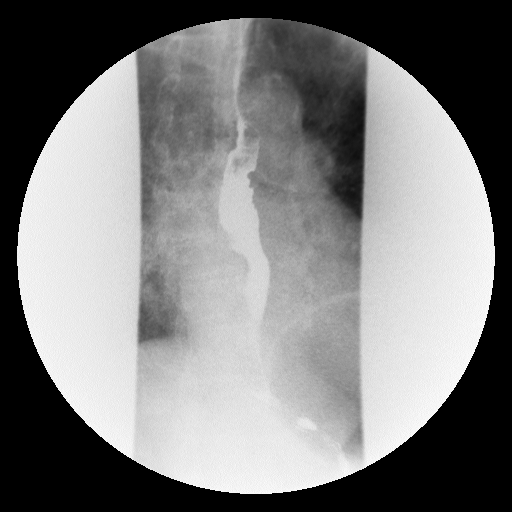

[Series 8: run · 1 of 1 slices shown (8 of 17)]
[im 1/1]
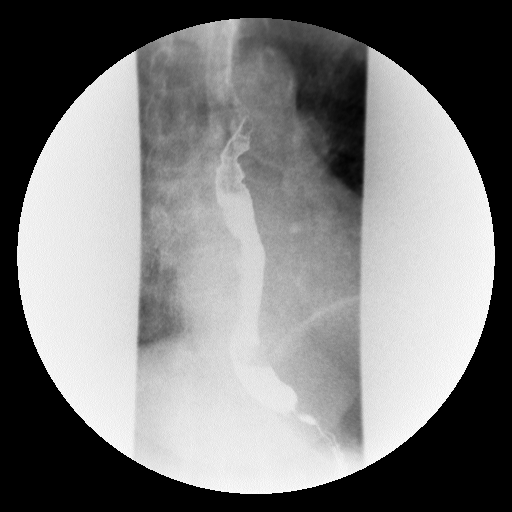

[Series 9: run · 1 of 1 slices shown (9 of 17)]
[im 1/1]
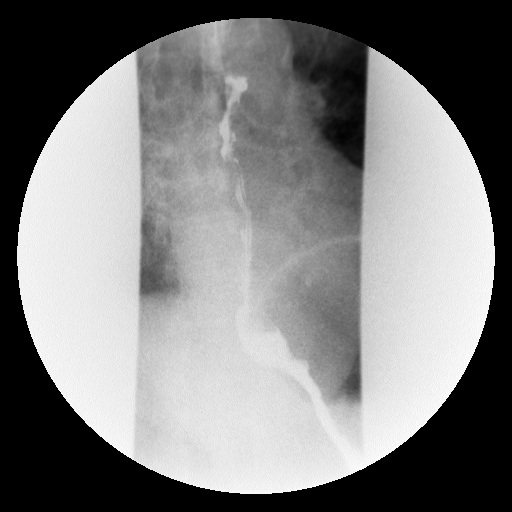

[Series 10: run · 1 of 1 slices shown (10 of 17)]
[im 1/1]
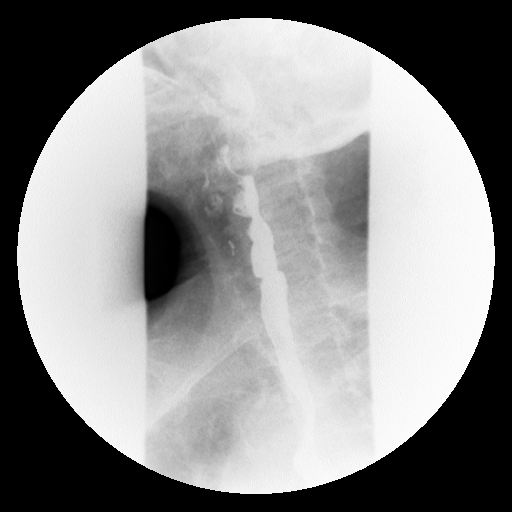

[Series 11: run · 1 of 1 slices shown (11 of 17)]
[im 1/1]
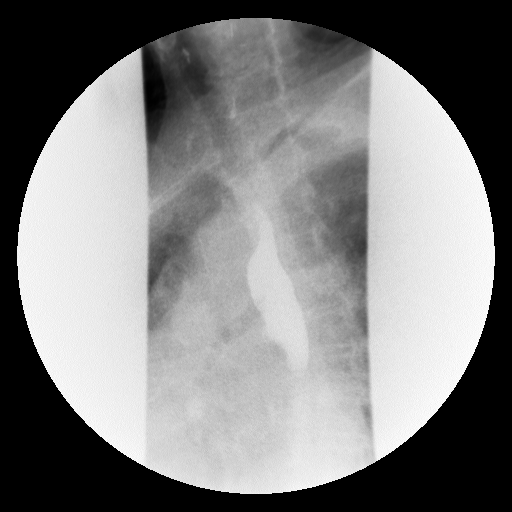

[Series 12: run · 1 of 1 slices shown (12 of 17)]
[im 1/1]
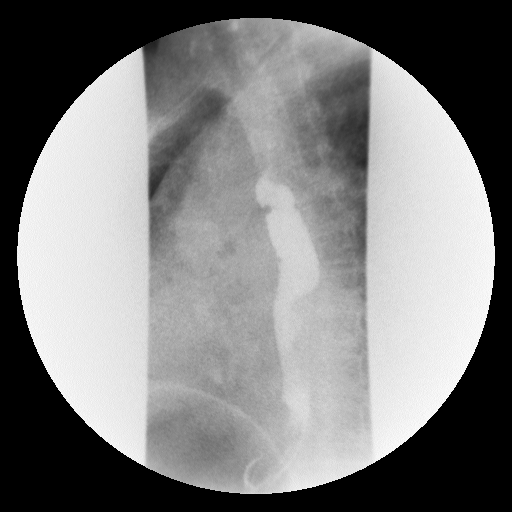

[Series 13: run · 1 of 1 slices shown (13 of 17)]
[im 1/1]
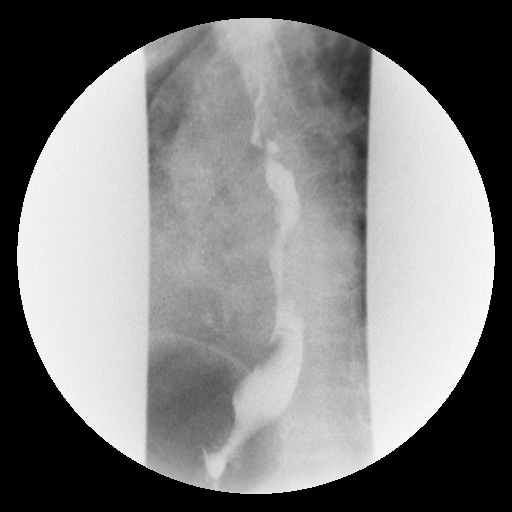

[Series 14: run · 1 of 1 slices shown (14 of 17)]
[im 1/1]
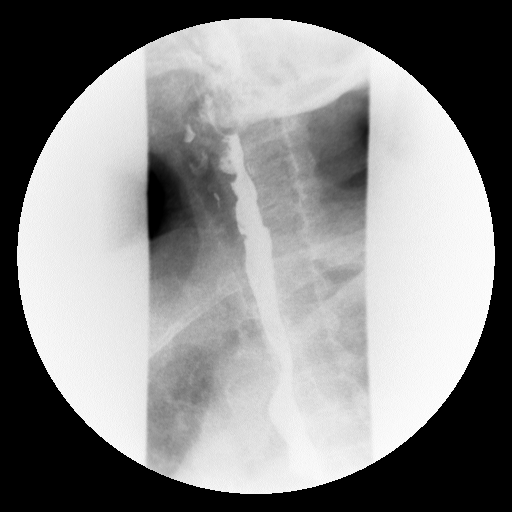

[Series 15: run · 1 of 1 slices shown (15 of 17)]
[im 1/1]
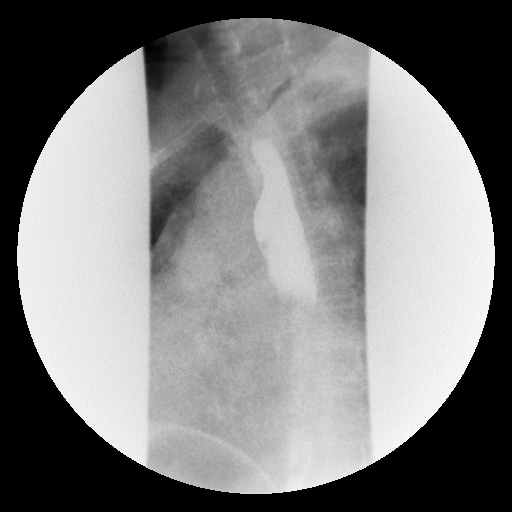

[Series 16: run · 1 of 1 slices shown (16 of 17)]
[im 1/1]
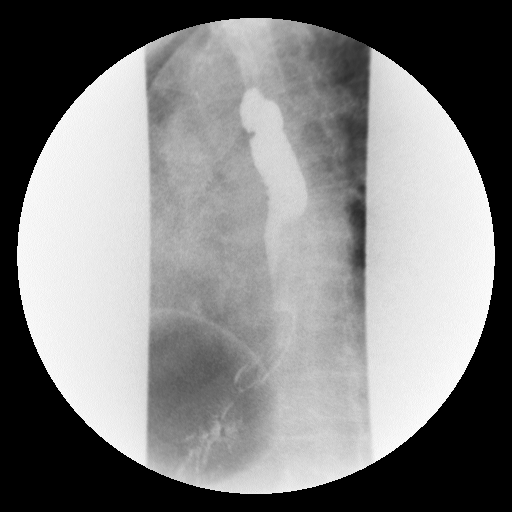

[Series 17: run · 1 of 1 slices shown (17 of 17)]
[im 1/1]
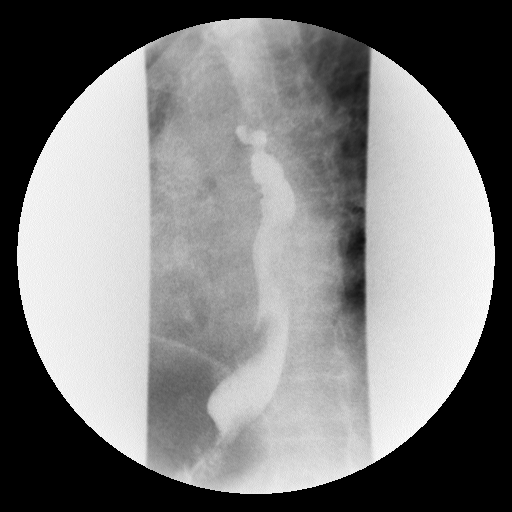

[17 of 17 positions shown; findings below may reference images not displayed]

FINDINGS: The patient swallowed barium without difficulty. Normal primary
esophageal peristalsis and mild intermittent impairment of the
secondary peristalsis with mild intermittent reversal of the normal
peristalsis in the esophagus. No hypopharyngeal abnormalities were
seen.

There is mild web-like narrowing of the proximal cervical esophagus.
There were also 3 small, rounded, anterior indentations in the
proximal thoracic esophagus. No significant luminal narrowing in the
thoracic esophagus.

The remainder the esophagus has a normal appearance with no
strictures, masses or ulcerations. No hiatal hernia or
gastroesophageal reflux were seen during the examination.

The patient tried to swallow a barium tablet. However, she could not
move the tablet from the front of the mouth to the back of the
mouth.
IMPRESSION: 1. Oral stage difficulty with a barium tablet, resulting in
inability of the patient to swallow the tablet.
2. Mild web-like narrowing of the proximal cervical esophagus.
3. 3 small, rounded anterior indentations in the proximal thoracic
esophagus. These could represent small polyps or areas of muscle
spasm.
4. Mild, intermittently impaired secondary esophageal peristalsis
with mild intermittent reversed peristalsis.
5. No hiatal hernia or gastroesophageal reflux seen.

## 2017-07-11 ENCOUNTER — Ambulatory Visit (INDEPENDENT_AMBULATORY_CARE_PROVIDER_SITE_OTHER): Payer: Medicare Other | Admitting: Pharmacist

## 2017-07-11 ENCOUNTER — Encounter: Payer: Self-pay | Admitting: Internal Medicine

## 2017-07-11 ENCOUNTER — Ambulatory Visit (INDEPENDENT_AMBULATORY_CARE_PROVIDER_SITE_OTHER): Payer: Medicare Other | Admitting: Internal Medicine

## 2017-07-11 VITALS — BP 162/96 | HR 86 | Temp 98.0°F | Ht 62.0 in | Wt 139.9 lb

## 2017-07-11 DIAGNOSIS — E119 Type 2 diabetes mellitus without complications: Secondary | ICD-10-CM | POA: Diagnosis not present

## 2017-07-11 DIAGNOSIS — I152 Hypertension secondary to endocrine disorders: Secondary | ICD-10-CM | POA: Diagnosis not present

## 2017-07-11 DIAGNOSIS — Z7901 Long term (current) use of anticoagulants: Secondary | ICD-10-CM | POA: Diagnosis not present

## 2017-07-11 DIAGNOSIS — Z79899 Other long term (current) drug therapy: Secondary | ICD-10-CM | POA: Diagnosis not present

## 2017-07-11 DIAGNOSIS — Z86718 Personal history of other venous thrombosis and embolism: Secondary | ICD-10-CM | POA: Diagnosis not present

## 2017-07-11 DIAGNOSIS — E1159 Type 2 diabetes mellitus with other circulatory complications: Secondary | ICD-10-CM

## 2017-07-11 DIAGNOSIS — I10 Essential (primary) hypertension: Principal | ICD-10-CM

## 2017-07-11 LAB — POCT INR: INR: 2.2

## 2017-07-11 MED ORDER — HYDRALAZINE HCL 25 MG PO TABS: 25.0000 mg | ORAL_TABLET | Freq: Three times a day (TID) | ORAL | 11 refills | Status: DC

## 2017-07-11 NOTE — Patient Instructions (Addendum)
FOLLOW-UP INSTRUCTIONS When: Approximately 4 weeks For: Hypertension F/U, Medication change What to bring: Home blood pressure readings   Thank you for allowing [place] to care for you.  For your High blood pressure: - You blood pressure today was elevated - Increase Hydralazine to 25mg , Three times a day - Continue taking Clonidine 0.3/24hr Patch, Weekly - Continue taking Lasix 80mg , Daily  Please follow up in about 1 month to reevaluate blood pressure

## 2017-07-11 NOTE — Progress Notes (Signed)
Anticoagulation Management Chelsea Mcdaniel is a 83 y.o. female who reports to the clinic for monitoring of warfarin treatment.    Indication: DVT , history of; long term use of anticoagulant. Duration: indefinite Supervising physician: [facility] Visit History: Patient does not report signs/symptoms of bleeding or thromboembolism  Other recent changes: No diet, medications, lifestyle changes endorsed by the patient.  Anticoagulation Episode Summary    Current INR goal:   2.0-3.0  TTR:   68.9 % (3.5 y)  Next INR check:   08/01/2017  INR from last check:   2.20 (07/11/2017)  Weekly max warfarin dose:     Target end date:     INR check location:     Preferred lab:     Send INR reminders to:      Indications   EMBOLISM/THROMBOSIS DEEP VSL LWR EXTRM NOS (Resolved) [I82.409] Long-term (current) use of anticoagulants [Z79.01]       Comments:           Allergies  Allergen Reactions  . Diltiazem Other (See Comments)    Symptomatic bradaycardia  . Metoprolol Other (See Comments)    Symptomatic bradycardia   Prior to Admission medications   Medication Sig Start Date End Date Taking? Authorizing Provider  ACCU-CHEK AVIVA PLUS test strip USE 1 TIME DAILY TO CHECK BLOOD SUGAR 02/18/16  Yes Riccardo Dubin, MD  ACCU-CHEK SOFTCLIX LANCETS lancets Used to check blood sugar 3 times daily. Dx code E11.8 06/08/17  Yes Chundi, Verne Spurr, MD  allopurinol (ZYLOPRIM) 100 MG tablet Take 1.5 tablets (150 mg total) by mouth daily. 03/25/17 03/25/18 Yes [name], MD  Blood Glucose Monitoring Suppl (ACCU-CHEK AVIVA PLUS) w/Device KIT Check blood sugar 1 time a day 09/30/16  Yes Riccardo Dubin, MD  Calcium Carbonate-Vitamin D 600-400 MG-UNIT per tablet Take 1 tablet by mouth 2 (two) times daily.   Yes [provider]  cloNIDine (CATAPRES - DOSED IN MG/24 HR) 0.3 mg/24hr patch Place 1 patch (0.3 mg total) onto the skin once a week. 06/20/17  Yes Rice,  Resa Miner, MD  dorzolamide-timolol (COSOPT) 22.3-6.8 MG/ML ophthalmic solution Place 1 drop into both eyes 2 (two) times daily. 01/21/16  Yes [provider]  furosemide (LASIX) 10 MG/ML solution Take 80 mg by mouth once.   Yes [provider]  GNP STOOL SOFTENER/LAXATIVE 8.6-50 MG tablet TAKE 2 TABLETS BY MOUTH AT BEDTIME AS NEEDED FOR CONSTIPATION 07/16/16  Yes Sid Falcon, MD  hydrALAZINE (APRESOLINE) 25 MG tablet Take 1 tablet (25 mg total) by mouth 3 (three) times daily. 07/11/17 07/11/18 Yes Neva Seat, MD  JANUVIA 25 MG tablet TAKE 1 TABLET BY MOUTH EVERY DAY 08/23/16  Yes Patel, Rushil V, MD  LUMIGAN 0.01 % SOLN Place 1 drop into both eyes every morning.  12/05/13  Yes [provider]  polyethylene glycol (MIRALAX) packet Take 17 g by mouth 2 (two) times daily. 03/25/17  Yes [name], MD  polyethylene glycol powder (GLYCOLAX/MIRALAX) powder Take 17 g by mouth 2 (two) times daily. 03/25/17  Yes Bartholomew Crews, MD  warfarin (COUMADIN) 5 MG tablet Take 1.5 tablets on Sundays, Tuesdays and Thursdays. All other days, take only 1 tablet. 11/29/16  Yes Oval Linsey, MD   Past Medical History:  Diagnosis Date  . Anemia     normocytic anemia with baseline hemoglobin 10-11  . Blindness of left eye     likely related to stroke, left cataract removed from that  eye with complications  . Calculus gallbladder and bile duct with cholecystitis with obstruction 01/2015  . Chronic kidney disease 2006    left renal artery stenosis with probable hemodynamic significance, kidneys are normal in morphology without focal lesions or hydronephrosis this is based but cannot A. of the abdomen with and without contrast done on the 31st 2006  . CKD (chronic kidney disease), stage III ([place])   . CVA (cerebral vascular accident) ([place]) 1990's  . CVA (cerebrovascular accident) [facility])  October 2007    CT of the head atrophy with multiple remote insults noted but no  definite acute findings  . Glaucoma   . Gout   . Hyperlipidemia   . Hypertension   . Personal history of DVT (deep vein thrombosis) 08/11/2010   BLE  . Type II diabetes mellitus ([place])    Social History   Socioeconomic History  . Marital status: Widowed    Spouse name: Not on file  . Number of children: Not on file  . Years of education: Not on file  . Highest education level: Not on file  Social Needs  . Financial resource strain: Not on file  . Food insecurity - worry: Not on file  . Food insecurity - inability: Not on file  . Transportation needs - medical: Not on file  . Transportation needs - non-medical: Not on file  Occupational History  . Not on file  Tobacco Use  . Smoking status: Never Smoker  . Smokeless tobacco: Never Used  Substance and Sexual Activity  . Alcohol use: No    Alcohol/week: 0.0 oz  . Drug use: No  . Sexual activity: No  Other Topics Concern  . Not on file  Social History Narrative    Patient is a widow. She has 11 children 5 of who are living. She is retired in 1993 from [employer]. She denies tobacco alcohol or drug use.   Family History  Problem Relation Age of Onset  . Heart disease Mother   . Diabetes Mother   . Hyperlipidemia Mother   . Hypertension Mother   . Heart disease Father   . Diabetes Father   . Hyperlipidemia Father   . Hypertension Father     ASSESSMENT Recent Results: The most recent result is correlated with 40 mg per week: Lab Results  Component Value Date   INR 2.20 07/11/2017   INR 2.20 06/20/2017   INR 3.00 05/30/2017    Anticoagulation Dosing: Description   Take 1 tablet by mouth once-daily at 6PM on all days of the week--EXCEPT on Mondays and Thursdays--take 1 & 1/2 tablets of your '5mg'$  peach colored warfarin tablets on Mondays and Thursdays.        INR today: Therapeutic  PLAN Weekly dose was unchanged.  Patient Instructions  Patient instructed to take medications as defined in the  Anti-coagulation Track section of this encounter.  Patient instructed to take today's dose.  Patient instructed to take 1 tablet by mouth once-daily at [facility] on all days of the week--EXCEPT on Mondays and Thursdays--take 1 & 1/2 tablets of your '5mg'$  peach colored warfarin tablets on Mondays and Thursdays. Patient verbalized understanding of these instructions.     Patient advised to contact clinic or seek medical attention if signs/symptoms of bleeding or thromboembolism occur.  Patient verbalized understanding by repeating back information and was advised to contact me if further medication-related questions arise. Patient was also provided an information handout.  Follow-up Return in 4 weeks (on 08/08/2017) for  Follow up INR at [facility], PharmD, CACP, CPP  15 minutes spent face-to-face with the patient during the encounter. 50% of time spent on education. 50% of time was spent on fingerstick point of care INR sample collection, processing, results determination, and documentation in [email or web address].

## 2017-07-11 NOTE — Progress Notes (Signed)
   CC: Hypertension  HPI:  [name] is a 83 y.o. F with PMHx listed below presenting for Hypertension. Please see the A&P for the status of the patient's chronic medical problems.   Past Medical History:  Diagnosis Date  . Anemia     normocytic anemia with baseline hemoglobin 10-11  . Blindness of left eye     likely related to stroke, left cataract removed from that eye with complications  . Calculus gallbladder and bile duct with cholecystitis with obstruction 01/2015  . Chronic kidney disease 2006    left renal artery stenosis with probable hemodynamic significance, kidneys are normal in morphology without focal lesions or hydronephrosis this is based but cannot A. of the abdomen with and without contrast done on the 31st 2006  . CKD (chronic kidney disease), stage III ([place])   . CVA (cerebral vascular accident) ([place]) 1990's  . CVA (cerebrovascular accident) [facility])  October 2007    CT of the head atrophy with multiple remote insults noted but no definite acute findings  . Glaucoma   . Gout   . Hyperlipidemia   . Hypertension   . Personal history of DVT (deep vein thrombosis) 08/11/2010   BLE  . Type II diabetes mellitus ([place])    Review of Systems:  Performed and negative except as otherwise indicated.  Physical Exam:  Vitals:   07/11/17 1520 07/11/17 1613 07/11/17 1614  BP: (!) 215/76 (!) 153/62 (!) 162/96  Pulse: 63 86 86  Temp: 98 F (36.7 C)    TempSrc: Oral    SpO2: 100%    Weight: 139 lb 14.4 oz (63.5 kg)    Height: 5\' 2"  (1.575 m)     Physical Exam  Constitutional: She appears well-developed and well-nourished. No distress.  Cardiovascular: Normal rate, regular rhythm, normal heart sounds and intact distal pulses.  Pulmonary/Chest: Effort normal and breath sounds normal. No respiratory distress.  Abdominal: Soft. Bowel sounds are normal. She exhibits no distension. There is no tenderness.  Musculoskeletal: She exhibits no edema.     Assessment &  Plan:   See Encounters Tab for problem based charting.  Patient seen with Dr. Lynnae January

## 2017-07-11 NOTE — Patient Instructions (Signed)
Patient instructed to take medications as defined in the Anti-coagulation Track section of this encounter.  Patient instructed to take today's dose.  Patient instructed to take 1 tablet by mouth once-daily at [facility] on all days of the week--EXCEPT on Mondays and Thursdays--take 1 & 1/2 tablets of your 5mg  peach colored warfarin tablets on Mondays and Thursdays. Patient verbalized understanding of these instructions.

## 2017-07-11 NOTE — Assessment & Plan Note (Addendum)
Patient is feeling well today. She was initially hypertensive to 212/76, but initial repeat BP was 153/62. Her Third blood pressure reading was 162/96. Patient lower extremitiy edema remains improved. She denies any sedation with increase in clonidine. Will increase hydralazine as BP remains greater than 786 systolic, patient is on a significant dose of lasix and patient is maximized on clonidine.  - Increase Hydralazine to 25mg , TID - Clonidine 0.3/24hr Patch, Weekly - Lasix 80mg , Daily - Follow up in about 4 weeks for recheck

## 2017-07-12 ENCOUNTER — Other Ambulatory Visit: Payer: Self-pay | Admitting: Pharmacist

## 2017-07-12 ENCOUNTER — Telehealth: Payer: Self-pay | Admitting: Pharmacist

## 2017-07-12 MED ORDER — WARFARIN SODIUM 5 MG PO TABS: ORAL_TABLET | ORAL | 2 refills | Status: DC

## 2017-07-12 NOTE — Telephone Encounter (Signed)
Reviewed most recent notes. LAsix supposed to be 80 QD. Has appt sch

## 2017-07-12 NOTE — Progress Notes (Signed)
Refill on warfarin 5mg  #32 with 2 refills. Current directions as of 17-DEC-18:  Take 1 & 1/2 tablets of your 5mg  peach-colored warfarin tablets on Mondays and Thursdays; all other days--one (1) tablet, by mouth at [facility] each day.

## 2017-07-12 NOTE — Telephone Encounter (Signed)
Daughter in law called asking for refill on warfarin to be sent to [facility], [place], [place]. Will do.

## 2017-07-12 NOTE — Progress Notes (Signed)
Internal Medicine Clinic Attending  I saw and evaluated the patient.  I personally confirmed the key portions of the history and exam documented by Dr. Melvin and I reviewed pertinent patient test results.  The assessment, diagnosis, and plan were formulated together and I agree with the documentation in the resident's note.  

## 2017-07-14 ENCOUNTER — Telehealth: Payer: Self-pay | Admitting: *Deleted

## 2017-07-14 NOTE — Telephone Encounter (Signed)
[facility] nurse case manager calls and ask pt's allergies, recent lab values

## 2017-07-15 ENCOUNTER — Telehealth: Payer: Self-pay | Admitting: Internal Medicine

## 2017-07-15 NOTE — Telephone Encounter (Signed)
Patient daughter in law is calling, physician increase patient medicine hydralazine and now patient feet is very swollen. Pls call patient

## 2017-07-15 NOTE — Telephone Encounter (Signed)
Attempted to rtc to pt's daughter, lm for rtc, will send to dr Trilby Drummer as Juluis Rainier and he may give pt's daughter a call

## 2017-07-20 NOTE — Telephone Encounter (Signed)
Attempted to call patient's relative regarding increased swelling, no answer.

## 2017-07-28 NOTE — Telephone Encounter (Signed)
I called the pt's home today, spoke w/ pt she states she feels fine, ask her to have her daughter to call clinic if she has concerns, she was agreeable

## 2017-08-01 ENCOUNTER — Ambulatory Visit (INDEPENDENT_AMBULATORY_CARE_PROVIDER_SITE_OTHER): Payer: Medicare Other | Admitting: Pharmacist

## 2017-08-01 DIAGNOSIS — Z7901 Long term (current) use of anticoagulants: Secondary | ICD-10-CM | POA: Diagnosis not present

## 2017-08-01 DIAGNOSIS — Z86718 Personal history of other venous thrombosis and embolism: Secondary | ICD-10-CM | POA: Diagnosis not present

## 2017-08-01 DIAGNOSIS — I6930 Unspecified sequelae of cerebral infarction: Secondary | ICD-10-CM

## 2017-08-01 LAB — POCT INR: INR: 2.3

## 2017-08-01 NOTE — Progress Notes (Signed)
Anticoagulation Management Chelsea Mcdaniel is a 84 y.o. female who reports to the clinic for monitoring of warfarin treatment.    Indication: DVT, deep VSL LWR Extrem NOS (resolved0 [182.409], long-term current use of anticoagulants [Z79.01].   Duration: indefinite Supervising physician: Aldine Contes  Anticoagulation Clinic Visit History: Patient does not report signs/symptoms of bleeding or thromboembolism  Other recent changes: No diet, medications, lifestyle endorsed.  Anticoagulation Episode Summary    Current INR goal:   2.0-3.0  TTR:   69.4 % (3.6 y)  Next INR check:   08/29/2017  INR from last check:   2.30 (08/01/2017)  Weekly max warfarin dose:     Target end date:     INR check location:     Preferred lab:     Send INR reminders to:      Indications   EMBOLISM/THROMBOSIS DEEP VSL LWR EXTRM NOS (Resolved) [I82.409] Long-term (current) use of anticoagulants [Z79.01]       Comments:           Allergies  Allergen Reactions  . Diltiazem Other (See Comments)    Symptomatic bradaycardia  . Metoprolol Other (See Comments)    Symptomatic bradycardia   Prior to Admission medications   Medication Sig Start Date End Date Taking? Authorizing Provider  ACCU-CHEK AVIVA PLUS test strip USE 1 TIME DAILY TO CHECK BLOOD SUGAR 02/18/16  Yes Riccardo Dubin, MD  ACCU-CHEK SOFTCLIX LANCETS lancets Used to check blood sugar 3 times daily. Dx code E11.8 06/08/17  Yes Chundi, Verne Spurr, MD  allopurinol (ZYLOPRIM) 100 MG tablet Take 1.5 tablets (150 mg total) by mouth daily. 03/25/17 03/25/18 Yes [name], MD  Blood Glucose Monitoring Suppl (ACCU-CHEK AVIVA PLUS) w/Device KIT Check blood sugar 1 time a day 09/30/16  Yes Riccardo Dubin, MD  Calcium Carbonate-Vitamin D 600-400 MG-UNIT per tablet Take 1 tablet by mouth 2 (two) times daily.   Yes [provider]  cloNIDine (CATAPRES - DOSED IN MG/24 HR) 0.3 mg/24hr patch Place 1 patch (0.3 mg total) onto the skin once a week.  06/20/17  Yes Rice, Resa Miner, MD  dorzolamide-timolol (COSOPT) 22.3-6.8 MG/ML ophthalmic solution Place 1 drop into both eyes 2 (two) times daily. 01/21/16  Yes [provider]  furosemide (LASIX) 10 MG/ML solution Take 8 mLs (80 mg total) by mouth daily. 07/12/17  Yes Bartholomew Crews, MD  GNP STOOL SOFTENER/LAXATIVE 8.6-50 MG tablet TAKE 2 TABLETS BY MOUTH AT BEDTIME AS NEEDED FOR CONSTIPATION 07/16/16  Yes Sid Falcon, MD  hydrALAZINE (APRESOLINE) 25 MG tablet Take 1 tablet (25 mg total) by mouth 3 (three) times daily. 07/11/17 07/11/18 Yes Neva Seat, MD  JANUVIA 25 MG tablet TAKE 1 TABLET BY MOUTH EVERY DAY 08/23/16  Yes Patel, Rushil V, MD  LUMIGAN 0.01 % SOLN Place 1 drop into both eyes every morning.  12/05/13  Yes [provider]  polyethylene glycol (MIRALAX) packet Take 17 g by mouth 2 (two) times daily. 03/25/17  Yes [name], MD  polyethylene glycol powder (GLYCOLAX/MIRALAX) powder Take 17 g by mouth 2 (two) times daily. 03/25/17  Yes Bartholomew Crews, MD  warfarin (COUMADIN) 5 MG tablet Take 1.5 tablets on Mondays and Thursdays. All other days, take only 1 tablet. 07/12/17  Yes Pennie Banter, RPH-CPP   Past Medical History:  Diagnosis Date  . Anemia     normocytic anemia with baseline hemoglobin 10-11  . Blindness of left eye     likely related  to stroke, left cataract removed from that eye with complications  . Calculus gallbladder and bile duct with cholecystitis with obstruction 01/2015  . Chronic kidney disease 2006    left renal artery stenosis with probable hemodynamic significance, kidneys are normal in morphology without focal lesions or hydronephrosis this is based but cannot A. of the abdomen with and without contrast done on the 31st 2006  . CKD (chronic kidney disease), stage III ([place])   . CVA (cerebral vascular accident) ([place]) 1990's  . CVA (cerebrovascular accident) [facility])  October 2007    CT of the head atrophy with  multiple remote insults noted but no definite acute findings  . Glaucoma   . Gout   . Hyperlipidemia   . Hypertension   . Personal history of DVT (deep vein thrombosis) 08/11/2010   BLE  . Type II diabetes mellitus ([place])    Social History   Socioeconomic History  . Marital status: Widowed    Spouse name: Not on file  . Number of children: Not on file  . Years of education: Not on file  . Highest education level: Not on file  Social Needs  . Financial resource strain: Not on file  . Food insecurity - worry: Not on file  . Food insecurity - inability: Not on file  . Transportation needs - medical: Not on file  . Transportation needs - non-medical: Not on file  Occupational History  . Not on file  Tobacco Use  . Smoking status: Never Smoker  . Smokeless tobacco: Never Used  Substance and Sexual Activity  . Alcohol use: No    Alcohol/week: 0.0 oz  . Drug use: No  . Sexual activity: No  Other Topics Concern  . Not on file  Social History Narrative    Patient is a widow. She has 11 children 5 of who are living. She is retired in 1993 from [employer]. She denies tobacco alcohol or drug use.   Family History  Problem Relation Age of Onset  . Heart disease Mother   . Diabetes Mother   . Hyperlipidemia Mother   . Hypertension Mother   . Heart disease Father   . Diabetes Father   . Hyperlipidemia Father   . Hypertension Father     ASSESSMENT Recent Results: The most recent result is correlated with 40 mg per week: Lab Results  Component Value Date   INR 2.30 08/01/2017   INR 2.20 07/11/2017   INR 2.20 06/20/2017    Anticoagulation Dosing: Description   Take 1 tablet by mouth once-daily at 6PM on all days of the week--EXCEPT on Mondays and Thursdays--take 1 & 1/2 tablets of your '5mg'$  peach colored warfarin tablets on Mondays and Thursdays.        INR today: Therapeutic  PLAN Weekly dose was unchanged.   Patient Instructions  Patient instructed to take  medications as defined in the Anti-coagulation Track section of this encounter.  Patient instructed to take today's dose.  Patient instructed to take  1 tablet by mouth once-daily at [facility] on all days of the week--EXCEPT on Mondays and Thursdays--take 1 & 1/2 tablets of your '5mg'$  peach colored warfarin tablets on Mondays and Thursdays. Patient verbalized understanding of these instructions.     Patient advised to contact clinic or seek medical attention if signs/symptoms of bleeding or thromboembolism occur.  Patient verbalized understanding by repeating back information and was advised to contact me if further medication-related questions arise. Patient was also provided an information handout.  Follow-up Return in 4 weeks (on 08/29/2017) for Follow up INR at [place], PharmD, CACP, CPP  15 minutes spent face-to-face with the patient during the encounter. 50% of time spent on education. 50% of time was spent on fingerstick point of care INR sample collection, processing, results determination, and documentation.

## 2017-08-01 NOTE — Patient Instructions (Signed)
Patient instructed to take medications as defined in the Anti-coagulation Track section of this encounter.  Patient instructed to take today's dose.  Patient instructed to take  1 tablet by mouth once-daily at [facility] on all days of the week--EXCEPT on Mondays and Thursdays--take 1 & 1/2 tablets of your 5mg  peach colored warfarin tablets on Mondays and Thursdays. Patient verbalized understanding of these instructions.

## 2017-08-02 NOTE — Progress Notes (Signed)
INTERNAL MEDICINE TEACHING ATTENDING ADDENDUM - Laurena Valko M.D  Duration- indefinite, Indication- recurrent unprovoked DVT, INR- therapeutic. Agree with pharmacy recommendations as outlined in their note.

## 2017-08-03 DIAGNOSIS — N2581 Secondary hyperparathyroidism of renal origin: Secondary | ICD-10-CM | POA: Diagnosis not present

## 2017-08-03 DIAGNOSIS — R600 Localized edema: Secondary | ICD-10-CM | POA: Diagnosis not present

## 2017-08-03 DIAGNOSIS — N184 Chronic kidney disease, stage 4 (severe): Secondary | ICD-10-CM | POA: Diagnosis not present

## 2017-08-03 DIAGNOSIS — R809 Proteinuria, unspecified: Secondary | ICD-10-CM | POA: Diagnosis not present

## 2017-08-03 DIAGNOSIS — I129 Hypertensive chronic kidney disease with stage 1 through stage 4 chronic kidney disease, or unspecified chronic kidney disease: Secondary | ICD-10-CM | POA: Diagnosis not present

## 2017-08-04 ENCOUNTER — Other Ambulatory Visit: Payer: Self-pay | Admitting: Internal Medicine

## 2017-08-04 DIAGNOSIS — E1159 Type 2 diabetes mellitus with other circulatory complications: Secondary | ICD-10-CM

## 2017-08-04 DIAGNOSIS — I10 Essential (primary) hypertension: Principal | ICD-10-CM

## 2017-08-04 NOTE — Telephone Encounter (Signed)
Next appt scheduled  08/12/17 with PCP.

## 2017-08-08 ENCOUNTER — Ambulatory Visit: Payer: Self-pay

## 2017-08-10 NOTE — Progress Notes (Signed)
   CC: Hypertension Follow up  HPI:  [name] is a 84 y.o. with Diabetes Mellitus type 2, chronic kidney disease 4, and hypertension presented for hypertension follow up. Please see problem based charting for evaluation, assessment, and plan.   Past Medical History:  Diagnosis Date  . Anemia     normocytic anemia with baseline hemoglobin 10-11  . Blindness of left eye     likely related to stroke, left cataract removed from that eye with complications  . Calculus gallbladder and bile duct with cholecystitis with obstruction 01/2015  . Chronic kidney disease 2006    left renal artery stenosis with probable hemodynamic significance, kidneys are normal in morphology without focal lesions or hydronephrosis this is based but cannot A. of the abdomen with and without contrast done on the 31st 2006  . CKD (chronic kidney disease), stage III ([place])   . CVA (cerebral vascular accident) ([place]) 1990's  . CVA (cerebrovascular accident) [facility])  October 2007    CT of the head atrophy with multiple remote insults noted but no definite acute findings  . Glaucoma   . Gout   . Hyperlipidemia   . Hypertension   . Personal history of DVT (deep vein thrombosis) 08/11/2010   BLE  . Type II diabetes mellitus ([facility])    Review of Systems:  Denies headaches, sob, chest pain, abdominal pain, nausea or vomiting   Physical Exam:  Vitals:   08/12/17 1532 08/12/17 1552  BP: (!) 150/56 (!) 145/56  Pulse: 64 62  Temp: (!) 97.5 F (36.4 C)   TempSrc: Oral   SpO2: 100%   Weight: 138 lb 14.4 oz (63 kg)   Height: 5\' 2"  (1.575 m)     Physical Exam  Constitutional: She appears well-developed and well-nourished. No distress.  HENT:  Head: Normocephalic and atraumatic.  Eyes: Conjunctivae are normal.  Cardiovascular: Normal rate, regular rhythm, normal heart sounds and intact distal pulses.  Respiratory: Effort normal and breath sounds normal. No respiratory distress. She has no wheezes.  GI: Soft.  Bowel sounds are normal. She exhibits no distension. There is no tenderness.  Musculoskeletal: She exhibits no edema.  Bilateral foot swelling  Neurological: She is alert.  Skin: She is not diaphoretic. No erythema.  Psychiatric: She has a normal mood and affect. Her behavior is normal. Judgment and thought content normal.    Assessment & Plan:   See Encounters Tab for problem based charting.  Patient discussed with Dr. Angelia Mould

## 2017-08-12 ENCOUNTER — Ambulatory Visit (INDEPENDENT_AMBULATORY_CARE_PROVIDER_SITE_OTHER): Payer: Medicare Other | Admitting: Internal Medicine

## 2017-08-12 ENCOUNTER — Other Ambulatory Visit: Payer: Self-pay

## 2017-08-12 VITALS — BP 145/56 | HR 62 | Temp 97.5°F | Ht 62.0 in | Wt 138.9 lb

## 2017-08-12 DIAGNOSIS — Z7901 Long term (current) use of anticoagulants: Secondary | ICD-10-CM

## 2017-08-12 DIAGNOSIS — Z7984 Long term (current) use of oral hypoglycemic drugs: Secondary | ICD-10-CM | POA: Diagnosis not present

## 2017-08-12 DIAGNOSIS — E1122 Type 2 diabetes mellitus with diabetic chronic kidney disease: Secondary | ICD-10-CM | POA: Diagnosis not present

## 2017-08-12 DIAGNOSIS — I10 Essential (primary) hypertension: Principal | ICD-10-CM

## 2017-08-12 DIAGNOSIS — Z0000 Encounter for general adult medical examination without abnormal findings: Secondary | ICD-10-CM

## 2017-08-12 DIAGNOSIS — E1159 Type 2 diabetes mellitus with other circulatory complications: Secondary | ICD-10-CM

## 2017-08-12 DIAGNOSIS — I129 Hypertensive chronic kidney disease with stage 1 through stage 4 chronic kidney disease, or unspecified chronic kidney disease: Secondary | ICD-10-CM

## 2017-08-12 DIAGNOSIS — Z79899 Other long term (current) drug therapy: Secondary | ICD-10-CM | POA: Diagnosis not present

## 2017-08-12 DIAGNOSIS — N184 Chronic kidney disease, stage 4 (severe): Secondary | ICD-10-CM | POA: Diagnosis not present

## 2017-08-12 LAB — POCT GLYCOSYLATED HEMOGLOBIN (HGB A1C): Hemoglobin A1C: 7.4

## 2017-08-12 LAB — GLUCOSE, CAPILLARY: GLUCOSE-CAPILLARY: 196 mg/dL — AB (ref 65–99)

## 2017-08-12 MED ORDER — HYDRALAZINE HCL 50 MG PO TABS: 50.0000 mg | ORAL_TABLET | Freq: Three times a day (TID) | ORAL | 1 refills | Status: DC

## 2017-08-12 NOTE — Assessment & Plan Note (Signed)
The patient is on lifelong anticoagulation. Last INR=2.3 on 08/01/17.  -continue warfarin 5mg 

## 2017-08-12 NOTE — Assessment & Plan Note (Addendum)
During this visit the patient's a1c=7.4 and gluc=196. The patient's last a1c=7.2 in May 2018. The patient's home blood glucose measurements over the past month have ranged 180-224. The patient is taking [place] 25mg  qd. She states that she is compliant with medication. The patient's weight has not changed since last visit (Dec 2018).   -Continue Januvia 25mg  qd

## 2017-08-12 NOTE — Patient Instructions (Signed)
It was a pleasure to see you today [name]. Please make the following changes:  -In addition to your other medications please take Hydralazine 50mg  three times daily  If you have any questions or concerns, please call our clinic at [phone] between 9am-5pm and after hours call [phone] and ask for the internal medicine resident on call. If you feel you are having a medical emergency please call 911.   Thank you, we look forward to help you remain healthy!  Chelsea Mage, Chelsea Mcdaniel Internal Medicine PGY1

## 2017-08-13 NOTE — Assessment & Plan Note (Signed)
The patient refused influenza vaccine

## 2017-08-13 NOTE — Assessment & Plan Note (Signed)
The patient's blood pressure during this visit was 150/56. At home her bp has ranged 140-234/60-86. The patient currently on catapres 0.3mg  patch weekly, hydralazine 25mg  tid, and lasix 80mg  qd.   As the patient's blood pressure has not been controlled at home it was decided to increase hydralazine from 25mg  tid to 50mg  tid. The goal blood pressure for this patient is <150/90.  -Continue catapres 0.3mg  patch weekly and lasix 80mg qd -Increased hydralazine to 50mg  tid

## 2017-08-17 NOTE — Progress Notes (Signed)
Internal Medicine Clinic Attending  Case discussed with Dr. Chundi at the time of the visit.  We reviewed the resident's history and exam and pertinent patient test results.  I agree with the assessment, diagnosis, and plan of care documented in the resident's note. 

## 2017-08-29 ENCOUNTER — Ambulatory Visit (INDEPENDENT_AMBULATORY_CARE_PROVIDER_SITE_OTHER): Payer: Medicare Other | Admitting: Pharmacist

## 2017-08-29 DIAGNOSIS — Z86718 Personal history of other venous thrombosis and embolism: Secondary | ICD-10-CM

## 2017-08-29 DIAGNOSIS — Z5181 Encounter for therapeutic drug level monitoring: Secondary | ICD-10-CM | POA: Diagnosis not present

## 2017-08-29 DIAGNOSIS — Z7901 Long term (current) use of anticoagulants: Secondary | ICD-10-CM | POA: Diagnosis not present

## 2017-08-29 LAB — POCT INR: INR: 2.2

## 2017-08-29 NOTE — Progress Notes (Signed)
Anticoagulation Management Chelsea Mcdaniel is a 84 y.o. female who reports to the clinic for monitoring of warfarin treatment.    Indication: DVT, history of; long term (current) use of anticoagulants.  Duration: indefinite Supervising physician: [facility] Visit History: Patient does not report signs/symptoms of bleeding or thromboembolism  Other recent changes: No diet, medications, lifestyle changes endorsed by the patient to me at this visit.  Anticoagulation Episode Summary    Current INR goal:   2.0-3.0  TTR:   70.1 % (3.6 y)  Next INR check:   09/26/2017  INR from last check:   2.20 (08/29/2017)  Weekly max warfarin dose:     Target end date:     INR check location:     Preferred lab:     Send INR reminders to:      Indications   EMBOLISM/THROMBOSIS DEEP VSL LWR EXTRM NOS (Resolved) [I82.409] Long-term (current) use of anticoagulants [Z79.01]       Comments:           Allergies  Allergen Reactions  . Diltiazem Other (See Comments)    Symptomatic bradaycardia  . Metoprolol Other (See Comments)    Symptomatic bradycardia   Prior to Admission medications   Medication Sig Start Date End Date Taking? Authorizing Provider  ACCU-CHEK AVIVA PLUS test strip USE 1 TIME DAILY TO CHECK BLOOD SUGAR 02/18/16  Yes Riccardo Dubin, MD  ACCU-CHEK SOFTCLIX LANCETS lancets Used to check blood sugar 3 times daily. Dx code E11.8 06/08/17  Yes Chundi, Verne Spurr, MD  allopurinol (ZYLOPRIM) 100 MG tablet Take 1.5 tablets (150 mg total) by mouth daily. 03/25/17 03/25/18 Yes [name], MD  Blood Glucose Monitoring Suppl (ACCU-CHEK AVIVA PLUS) w/Device KIT Check blood sugar 1 time a day 09/30/16  Yes Riccardo Dubin, MD  Calcium Carbonate-Vitamin D 600-400 MG-UNIT per tablet Take 1 tablet by mouth 2 (two) times daily.   Yes [provider]  cloNIDine (CATAPRES - DOSED IN MG/24 HR) 0.3 mg/24hr patch Place 1 patch (0.3 mg total) onto the skin once a  week. 06/20/17  Yes Rice, Resa Miner, MD  dorzolamide-timolol (COSOPT) 22.3-6.8 MG/ML ophthalmic solution Place 1 drop into both eyes 2 (two) times daily. 01/21/16  Yes [provider]  furosemide (LASIX) 10 MG/ML solution Take 8 mLs (80 mg total) by mouth daily. 07/12/17  Yes Bartholomew Crews, MD  GNP STOOL SOFTENER/LAXATIVE 8.6-50 MG tablet TAKE 2 TABLETS BY MOUTH AT BEDTIME AS NEEDED FOR CONSTIPATION 07/16/16  Yes Sid Falcon, MD  hydrALAZINE (APRESOLINE) 50 MG tablet Take 1 tablet (50 mg total) by mouth 3 (three) times daily. 08/12/17 09/11/17 Yes Chundi, [name], MD  LUMIGAN 0.01 % SOLN Place 1 drop into both eyes every morning.  12/05/13  Yes [provider]  polyethylene glycol (MIRALAX) packet Take 17 g by mouth 2 (two) times daily. 03/25/17  Yes [name], MD  polyethylene glycol powder (GLYCOLAX/MIRALAX) powder Take 17 g by mouth 2 (two) times daily. 03/25/17  Yes Bartholomew Crews, MD  sitaGLIPtin (JANUVIA) 25 MG tablet Take 1 tablet (25 mg total) by mouth daily. 08/04/17 09/03/17 Yes Chundi, Verne Spurr, MD  warfarin (COUMADIN) 5 MG tablet Take 1.5 tablets on Mondays and Thursdays. All other days, take only 1 tablet. 07/12/17  Yes Pennie Banter, RPH-CPP   Past Medical History:  Diagnosis Date  . Anemia     normocytic anemia with baseline hemoglobin 10-11  . Blindness of left eye  likely related to stroke, left cataract removed from that eye with complications  . Calculus gallbladder and bile duct with cholecystitis with obstruction 01/2015  . Chronic kidney disease 2006    left renal artery stenosis with probable hemodynamic significance, kidneys are normal in morphology without focal lesions or hydronephrosis this is based but cannot A. of the abdomen with and without contrast done on the 31st 2006  . CKD (chronic kidney disease), stage III ([place])   . CVA (cerebral vascular accident) ([place]) 1990's  . CVA (cerebrovascular accident) [facility])  October 2007     CT of the head atrophy with multiple remote insults noted but no definite acute findings  . Glaucoma   . Gout   . Hyperlipidemia   . Hypertension   . Personal history of DVT (deep vein thrombosis) 08/11/2010   BLE  . Type II diabetes mellitus ([place])    Social History   Socioeconomic History  . Marital status: Widowed    Spouse name: Not on file  . Number of children: Not on file  . Years of education: Not on file  . Highest education level: Not on file  Social Needs  . Financial resource strain: Not on file  . Food insecurity - worry: Not on file  . Food insecurity - inability: Not on file  . Transportation needs - medical: Not on file  . Transportation needs - non-medical: Not on file  Occupational History  . Not on file  Tobacco Use  . Smoking status: Never Smoker  . Smokeless tobacco: Never Used  Substance and Sexual Activity  . Alcohol use: No    Alcohol/week: 0.0 oz  . Drug use: No  . Sexual activity: No  Other Topics Concern  . Not on file  Social History Narrative    Patient is a widow. She has 11 children 5 of who are living. She is retired in 1993 from [employer]. She denies tobacco alcohol or drug use.   Family History  Problem Relation Age of Onset  . Heart disease Mother   . Diabetes Mother   . Hyperlipidemia Mother   . Hypertension Mother   . Heart disease Father   . Diabetes Father   . Hyperlipidemia Father   . Hypertension Father     ASSESSMENT Recent Results: The most recent result is correlated with 40 mg per week: Lab Results  Component Value Date   INR 2.20 08/29/2017   INR 2.30 08/01/2017   INR 2.20 07/11/2017    Anticoagulation Dosing: Description   Take 1 tablet by mouth once-daily at 6PM on all days of the week--EXCEPT on Mondays and Thursdays--take 1 & 1/2 tablets of your '5mg'$  peach colored warfarin tablets on Mondays and Thursdays.        INR today: Therapeutic  PLAN Weekly dose was unchanged.  Patient Instructions   Patient instructed to take medications as defined in the Anti-coagulation Track section of this encounter.  Patient instructed to take today's dose.  Patient instructed to take 1 tablet by mouth once-daily at [facility] on all days of the week--EXCEPT on Mondays and Thursdays--take 1 & 1/2 tablets of your '5mg'$  peach colored warfarin tablets on Mondays and Thursdays.    Patient verbalized understanding of these instructions.     Patient advised to contact clinic or seek medical attention if signs/symptoms of bleeding or thromboembolism occur.  Patient verbalized understanding by repeating back information and was advised to contact me if further medication-related questions arise. Patient was also provided  an information handout.  Follow-up Return in about 4 weeks (around 09/26/2017) for Follow up INR at 3:30PM.  Pennie Banter, PharmD, CACP, CPP  15 minutes spent face-to-face with the patient during the encounter. 50% of time spent on education. 50% of time was spent on fingerstick point of care INR sample collection, processing, results determination, documentation in [email or web address].

## 2017-08-29 NOTE — Patient Instructions (Signed)
Patient instructed to take medications as defined in the Anti-coagulation Track section of this encounter.  Patient instructed to take today's dose.  Patient instructed to take 1 tablet by mouth once-daily at [facility] on all days of the week--EXCEPT on Mondays and Thursdays--take 1 & 1/2 tablets of your 5mg  peach colored warfarin tablets on Mondays and Thursdays.    Patient verbalized understanding of these instructions.

## 2017-09-02 ENCOUNTER — Other Ambulatory Visit: Payer: Self-pay | Admitting: *Deleted

## 2017-09-02 DIAGNOSIS — N184 Chronic kidney disease, stage 4 (severe): Principal | ICD-10-CM

## 2017-09-02 DIAGNOSIS — E1122 Type 2 diabetes mellitus with diabetic chronic kidney disease: Secondary | ICD-10-CM

## 2017-09-03 MED ORDER — GLUCOSE BLOOD VI STRP: ORAL_STRIP | 3 refills | Status: AC

## 2017-09-03 MED ORDER — ACCU-CHEK SOFTCLIX LANCET DEV KIT: PACK | 2 refills | Status: AC

## 2017-09-06 ENCOUNTER — Other Ambulatory Visit: Payer: Self-pay | Admitting: Pharmacist

## 2017-09-06 ENCOUNTER — Other Ambulatory Visit: Payer: Self-pay | Admitting: Internal Medicine

## 2017-09-06 DIAGNOSIS — I10 Essential (primary) hypertension: Principal | ICD-10-CM

## 2017-09-06 DIAGNOSIS — I152 Hypertension secondary to endocrine disorders: Secondary | ICD-10-CM

## 2017-09-06 DIAGNOSIS — E1159 Type 2 diabetes mellitus with other circulatory complications: Secondary | ICD-10-CM

## 2017-09-26 ENCOUNTER — Ambulatory Visit (INDEPENDENT_AMBULATORY_CARE_PROVIDER_SITE_OTHER): Payer: Medicare Other | Admitting: Pharmacist

## 2017-09-26 DIAGNOSIS — Z86718 Personal history of other venous thrombosis and embolism: Secondary | ICD-10-CM

## 2017-09-26 DIAGNOSIS — Z5181 Encounter for therapeutic drug level monitoring: Secondary | ICD-10-CM

## 2017-09-26 DIAGNOSIS — Z7901 Long term (current) use of anticoagulants: Secondary | ICD-10-CM | POA: Diagnosis not present

## 2017-09-27 LAB — POCT INR: INR: 2

## 2017-09-27 NOTE — Patient Instructions (Signed)
Patient instructed to take medications as defined in the Anti-coagulation Track section of this encounter.  Patient instructed to take today's dose.  Patient instructed to take 1 tablet by mouth once-daily at [facility] on all days of the week--EXCEPT on Mondays, Wednesdays and Fridays--take 1 & 1/2 tablets of your 5mg  peach colored warfarin tablets on Mondays , Wednesdays and Fridays.  Patient verbalized understanding of these instructions.

## 2017-09-27 NOTE — Progress Notes (Signed)
INTERNAL MEDICINE TEACHING ATTENDING ADDENDUM - Aldine Contes M.D  Duration-indefinite, Indication-recurrent CVAs, history of DVT, INR-therapeutic. Agree with pharmacy recommendations as outlined in their note.

## 2017-09-27 NOTE — Progress Notes (Signed)
Anticoagulation Management Chelsea Mcdaniel is a 84 y.o. female who reports to the clinic for monitoring of warfarin treatment.    Indication: DVT, history of; long term current use of anticoagulant. Duration: indefinite Supervising physician: Aldine Contes  Anticoagulation Clinic Visit History: Patient does not report signs/symptoms of bleeding or thromboembolism  Other recent changes: No diet, medications, lifestyle changes endorsed by the patient at this visit.  Anticoagulation Episode Summary    Current INR goal:   2.0-3.0  TTR:   70.7 % (3.7 y)  Next INR check:   10/24/2017  INR from last check:     Most recent INR:    2.00 (09/27/2017)  Weekly max warfarin dose:     Target end date:     INR check location:     Preferred lab:     Send INR reminders to:      Indications   EMBOLISM/THROMBOSIS DEEP VSL LWR EXTRM NOS (Resolved) [I82.409] Long-term (current) use of anticoagulants [Z79.01]       Comments:           Allergies  Allergen Reactions  . Diltiazem Other (See Comments)    Symptomatic bradaycardia  . Metoprolol Other (See Comments)    Symptomatic bradycardia   Prior to Admission medications   Medication Sig Start Date End Date Taking? Authorizing Provider  ACCU-CHEK SOFTCLIX LANCETS lancets Used to check blood sugar 3 times daily. Dx code E11.8 06/08/17  Yes Chundi, Verne Spurr, MD  allopurinol (ZYLOPRIM) 100 MG tablet Take 1.5 tablets (150 mg total) by mouth daily. 03/25/17 03/25/18 Yes [name], MD  Blood Glucose Monitoring Suppl (ACCU-CHEK AVIVA PLUS) w/Device KIT Check blood sugar 1 time a day 09/30/16  Yes Riccardo Dubin, MD  Calcium Carbonate-Vitamin D 600-400 MG-UNIT per tablet Take 1 tablet by mouth 2 (two) times daily.   Yes [provider]  cloNIDine (CATAPRES - DOSED IN MG/24 HR) 0.3 mg/24hr patch Place 1 patch (0.3 mg total) onto the skin once a week. 06/20/17  Yes Rice, Resa Miner, MD  dorzolamide-timolol (COSOPT) 22.3-6.8 MG/ML  ophthalmic solution Place 1 drop into both eyes 2 (two) times daily. 01/21/16  Yes [provider]  furosemide (LASIX) 10 MG/ML solution Take 8 mLs (80 mg total) by mouth daily. 09/06/17  Yes Chundi, [name], MD  glucose blood (ACCU-CHEK AVIVA PLUS) test strip USE 1 TIME DAILY TO CHECK BLOOD SUGAR 09/03/17  Yes Chundi, [name], MD  glucose blood (ACCU-CHEK AVIVA) test strip Use as instructed 05/03/11 09/03/18 Yes Sharda, Neema K, MD  GNP STOOL SOFTENER/LAXATIVE 8.6-50 MG tablet TAKE 2 TABLETS BY MOUTH AT BEDTIME AS NEEDED FOR CONSTIPATION 07/16/16  Yes Sid Falcon, MD  Lancets Misc. (ACCU-CHEK SOFTCLIX LANCET DEV) KIT Use to check blood sugar one time a day 09/03/17  Yes Chundi, [name], MD  LUMIGAN 0.01 % SOLN Place 1 drop into both eyes every morning.  12/05/13  Yes [provider]  polyethylene glycol (MIRALAX) packet Take 17 g by mouth 2 (two) times daily. 03/25/17  Yes [name], MD  polyethylene glycol powder (GLYCOLAX/MIRALAX) powder Take 17 g by mouth 2 (two) times daily. 03/25/17  Yes Bartholomew Crews, MD  sitaGLIPtin (JANUVIA) 25 MG tablet Take 1 tablet (25 mg total) by mouth daily. 09/06/17 10/06/17 Yes Chundi, [name], MD  warfarin (COUMADIN) 5 MG tablet TAKE 1.5 tablets by MOUTH ON monday AND thursday. TAKE 1 tablet by MOUTH ON ALL other DAYS OF THE WEEK 09/06/17  Yes [name], MD  hydrALAZINE (APRESOLINE) 50 MG tablet Take 1 tablet (50 mg total) by mouth 3 (three) times daily. 08/12/17 09/11/17  [name], MD   Past Medical History:  Diagnosis Date  . Anemia     normocytic anemia with baseline hemoglobin 10-11  . Blindness of left eye     likely related to stroke, left cataract removed from that eye with complications  . Calculus gallbladder and bile duct with cholecystitis with obstruction 01/2015  . Chronic kidney disease 2006    left renal artery stenosis with probable hemodynamic significance, kidneys are normal in morphology without focal lesions or  hydronephrosis this is based but cannot A. of the abdomen with and without contrast done on the 31st 2006  . CKD (chronic kidney disease), stage III ([place])   . CVA (cerebral vascular accident) ([place]) 1990's  . CVA (cerebrovascular accident) [facility])  October 2007    CT of the head atrophy with multiple remote insults noted but no definite acute findings  . Glaucoma   . Gout   . Hyperlipidemia   . Hypertension   . Personal history of DVT (deep vein thrombosis) 08/11/2010   BLE  . Type II diabetes mellitus ([place])    Social History   Socioeconomic History  . Marital status: Widowed    Spouse name: Not on file  . Number of children: Not on file  . Years of education: Not on file  . Highest education level: Not on file  Social Needs  . Financial resource strain: Not on file  . Food insecurity - worry: Not on file  . Food insecurity - inability: Not on file  . Transportation needs - medical: Not on file  . Transportation needs - non-medical: Not on file  Occupational History  . Not on file  Tobacco Use  . Smoking status: Never Smoker  . Smokeless tobacco: Never Used  Substance and Sexual Activity  . Alcohol use: No    Alcohol/week: 0.0 oz  . Drug use: No  . Sexual activity: No  Other Topics Concern  . Not on file  Social History Narrative    Patient is a widow. She has 11 children 5 of who are living. She is retired in 1993 from [employer]. She denies tobacco alcohol or drug use.   Family History  Problem Relation Age of Onset  . Heart disease Mother   . Diabetes Mother   . Hyperlipidemia Mother   . Hypertension Mother   . Heart disease Father   . Diabetes Father   . Hyperlipidemia Father   . Hypertension Father     ASSESSMENT Recent Results: The most recent result is correlated with 40 mg per week: Lab Results  Component Value Date   INR 2.00 09/27/2017   INR 2.20 08/29/2017   INR 2.30 08/01/2017    Anticoagulation Dosing: Description   Take 1 tablet by mouth  once-daily at 6PM on all days of the week--EXCEPT on Mondays, Wednesdays and Fridays--take 1 & 1/2 tablets of your 1m peach colored warfarin tablets on Mondays , Wednesdays and Fridays.         INR today: Therapeutic  PLAN Weekly dose was increased by 6% to 42.5 mg per week  Patient Instructions  Patient instructed to take medications as defined in the Anti-coagulation Track section of this encounter.  Patient instructed to take today's dose.  [facility]--EXCEPT on Mondays, Wednesdays and Fridays--take 1 &  1/2 tablets of your 54m peach colored warfarin tablets on Mondays , Wednesdays and Fridays.  Patient verbalized understanding of these instructions.     Patient advised to contact clinic or seek medical attention if signs/symptoms of bleeding or thromboembolism occur.  Patient verbalized understanding by repeating back information and was advised to contact me if further medication-related questions arise. Patient was also provided an information handout.  Follow-up Return in 4 weeks (on 10/24/2017) for Follow up INR at 1515h.  JPennie Banter PharmD, CACP, CPP  15 minutes spent face-to-face with the patient during the encounter. 50% of time spent on education. 50% of time was spent on fingerstick point of care INR sample collection, processing, results determination, dose adjustment and documentation in [email or web address]

## 2017-10-03 ENCOUNTER — Other Ambulatory Visit: Payer: Self-pay | Admitting: Internal Medicine

## 2017-10-03 DIAGNOSIS — E1159 Type 2 diabetes mellitus with other circulatory complications: Secondary | ICD-10-CM

## 2017-10-03 DIAGNOSIS — I10 Essential (primary) hypertension: Principal | ICD-10-CM

## 2017-10-19 ENCOUNTER — Telehealth: Payer: Self-pay | Admitting: *Deleted

## 2017-10-19 DIAGNOSIS — H2511 Age-related nuclear cataract, right eye: Secondary | ICD-10-CM | POA: Diagnosis not present

## 2017-10-19 DIAGNOSIS — H2702 Aphakia, left eye: Secondary | ICD-10-CM | POA: Diagnosis not present

## 2017-10-19 DIAGNOSIS — H401113 Primary open-angle glaucoma, right eye, severe stage: Secondary | ICD-10-CM | POA: Diagnosis not present

## 2017-10-19 DIAGNOSIS — H338 Other retinal detachments: Secondary | ICD-10-CM | POA: Diagnosis not present

## 2017-10-19 NOTE — Telephone Encounter (Signed)
Noted. Agree with your rec to take miralax. Could also try 1/2 bottle of mag citrate OTC

## 2017-10-19 NOTE — Telephone Encounter (Signed)
Pt's caregiver calls and states pt is constipated and is considering the ED, appt [facility] 3/28 at 1415 and went over miralax on medlist, pt has not been taking as ordered and has 6 bottles at home, caregiver will start giving as ordered and f/u 3/28 [facility]

## 2017-10-19 NOTE — Telephone Encounter (Signed)
Thank you :)

## 2017-10-20 ENCOUNTER — Ambulatory Visit: Payer: Self-pay

## 2017-10-24 ENCOUNTER — Ambulatory Visit: Payer: Self-pay

## 2017-10-26 DIAGNOSIS — R809 Proteinuria, unspecified: Secondary | ICD-10-CM | POA: Diagnosis not present

## 2017-10-26 DIAGNOSIS — R430 Anosmia: Secondary | ICD-10-CM | POA: Diagnosis not present

## 2017-10-26 DIAGNOSIS — I129 Hypertensive chronic kidney disease with stage 1 through stage 4 chronic kidney disease, or unspecified chronic kidney disease: Secondary | ICD-10-CM | POA: Diagnosis not present

## 2017-10-26 DIAGNOSIS — N184 Chronic kidney disease, stage 4 (severe): Secondary | ICD-10-CM | POA: Diagnosis not present

## 2017-10-26 DIAGNOSIS — N2581 Secondary hyperparathyroidism of renal origin: Secondary | ICD-10-CM | POA: Diagnosis not present

## 2017-10-31 ENCOUNTER — Other Ambulatory Visit: Payer: Self-pay | Admitting: Oncology

## 2017-10-31 ENCOUNTER — Other Ambulatory Visit: Payer: Self-pay | Admitting: Internal Medicine

## 2017-10-31 ENCOUNTER — Ambulatory Visit (INDEPENDENT_AMBULATORY_CARE_PROVIDER_SITE_OTHER): Payer: Medicare Other | Admitting: Pharmacist

## 2017-10-31 DIAGNOSIS — Z5181 Encounter for therapeutic drug level monitoring: Secondary | ICD-10-CM

## 2017-10-31 DIAGNOSIS — Z86718 Personal history of other venous thrombosis and embolism: Secondary | ICD-10-CM

## 2017-10-31 DIAGNOSIS — Z7901 Long term (current) use of anticoagulants: Secondary | ICD-10-CM

## 2017-10-31 DIAGNOSIS — M1A3710 Chronic gout due to renal impairment, right ankle and foot, without tophus (tophi): Secondary | ICD-10-CM

## 2017-10-31 LAB — POCT INR: INR: 2.8

## 2017-10-31 NOTE — Progress Notes (Signed)
Anticoagulation Management Chelsea Mcdaniel is a 84 y.o. female who reports to the clinic for monitoring of warfarin treatment.    Indication: DVT, history of; long term use of anticoagulants.   Duration: indefinite Supervising physician: Oval Linsey  Anticoagulation Clinic Visit History: Patient does not report signs/symptoms of bleeding or thromboembolism  Other recent changes: No diet, medications, lifestyle endorsed by the patient at this visit.  Anticoagulation Episode Summary    Current INR goal:   2.0-3.0  TTR:   71.4 % (3.8 y)  Next INR check:   11/28/2017  INR from last check:   2.80 (10/31/2017)  Weekly max warfarin dose:     Target end date:     INR check location:     Preferred lab:     Send INR reminders to:      Indications   EMBOLISM/THROMBOSIS DEEP VSL LWR EXTRM NOS (Resolved) [I82.409] Long-term (current) use of anticoagulants [Z79.01]       Comments:           Allergies  Allergen Reactions  . Diltiazem Other (See Comments)    Symptomatic bradaycardia  . Metoprolol Other (See Comments)    Symptomatic bradycardia   Prior to Admission medications   Medication Sig Start Date End Date Taking? Authorizing Provider  ACCU-CHEK SOFTCLIX LANCETS lancets Used to check blood sugar 3 times daily. Dx code E11.8 06/08/17  Yes Chundi, [name], MD  allopurinol (ZYLOPRIM) 100 MG tablet TAKE ONE AND A HALF TABLETS BY MOUTH EVERY DAY 10/31/17  Yes Chundi, [name], MD  Blood Glucose Monitoring Suppl (ACCU-CHEK AVIVA PLUS) w/Device KIT Check blood sugar 1 time a day 09/30/16  Yes Patel, Rayne Du, MD  Calcium Carbonate-Vitamin D 600-400 MG-UNIT per tablet Take 1 tablet by mouth 2 (two) times daily.   Yes [provider]  cloNIDine (CATAPRES - DOSED IN MG/24 HR) 0.3 mg/24hr patch Place 1 patch (0.3 mg total) onto the skin once a week. 06/20/17  Yes Rice, Resa Miner, MD  dorzolamide-timolol (COSOPT) 22.3-6.8 MG/ML ophthalmic solution Place 1 drop into both eyes 2 (two)  times daily. 01/21/16  Yes [provider]  furosemide (LASIX) 10 MG/ML solution Take 8 mLs (80 mg total) by mouth daily. 09/06/17  Yes Chundi, [name], MD  glucose blood (ACCU-CHEK AVIVA PLUS) test strip USE 1 TIME DAILY TO CHECK BLOOD SUGAR 09/03/17  Yes Chundi, [name], MD  glucose blood (ACCU-CHEK AVIVA) test strip Use as instructed 05/03/11 09/03/18 Yes Sharda, Neema K, MD  GNP STOOL SOFTENER/LAXATIVE 8.6-50 MG tablet TAKE 2 TABLETS BY MOUTH AT BEDTIME AS NEEDED FOR CONSTIPATION 07/16/16  Yes Sid Falcon, MD  JANUVIA 25 MG tablet TAKE 1 TABLET BY MOUTH EVERY DAY 10/04/17  Yes Chundi, [name], MD  Lancets Misc. (ACCU-CHEK SOFTCLIX LANCET DEV) KIT Use to check blood sugar one time a day 09/03/17  Yes Chundi, [name], MD  LUMIGAN 0.01 % SOLN Place 1 drop into both eyes every morning.  12/05/13  Yes [provider]  polyethylene glycol (MIRALAX) packet Take 17 g by mouth 2 (two) times daily. 03/25/17  Yes [name], MD  polyethylene glycol powder (GLYCOLAX/MIRALAX) powder Take 17 g by mouth 2 (two) times daily. 03/25/17  Yes Bartholomew Crews, MD  warfarin (COUMADIN) 5 MG tablet TAKE 1.5 tablets by MOUTH ON monday AND thursday. TAKE 1 tablet by MOUTH ON ALL other DAYS OF THE WEEK 09/06/17  Yes Chundi, [name], MD  hydrALAZINE (APRESOLINE) 50 MG tablet Take 1 tablet (50 mg total)  by mouth 3 (three) times daily. 08/12/17 09/11/17  [name], MD   Past Medical History:  Diagnosis Date  . Anemia     normocytic anemia with baseline hemoglobin 10-11  . Blindness of left eye     likely related to stroke, left cataract removed from that eye with complications  . Calculus gallbladder and bile duct with cholecystitis with obstruction 01/2015  . Chronic kidney disease 2006    left renal artery stenosis with probable hemodynamic significance, kidneys are normal in morphology without focal lesions or hydronephrosis this is based but cannot A. of the abdomen with and without contrast done  on the 31st 2006  . CKD (chronic kidney disease), stage III ([place])   . CVA (cerebral vascular accident) ([place]) 1990's  . CVA (cerebrovascular accident) [facility])  October 2007    CT of the head atrophy with multiple remote insults noted but no definite acute findings  . Glaucoma   . Gout   . Hyperlipidemia   . Hypertension   . Personal history of DVT (deep vein thrombosis) 08/11/2010   BLE  . Type II diabetes mellitus ([place])    Social History   Socioeconomic History  . Marital status: Widowed    Spouse name: Not on file  . Number of children: Not on file  . Years of education: Not on file  . Highest education level: Not on file  Occupational History  . Not on file  Social Needs  . Financial resource strain: Not on file  . Food insecurity:    Worry: Not on file    Inability: Not on file  . Transportation needs:    Medical: Not on file    Non-medical: Not on file  Tobacco Use  . Smoking status: Never Smoker  . Smokeless tobacco: Never Used  Substance and Sexual Activity  . Alcohol use: No    Alcohol/week: 0.0 oz  . Drug use: No  . Sexual activity: Never  Lifestyle  . Physical activity:    Days per week: Not on file    Minutes per session: Not on file  . Stress: Not on file  Relationships  . Social connections:    Talks on phone: Not on file    Gets together: Not on file    Attends religious service: Not on file    Active member of club or organization: Not on file    Attends meetings of clubs or organizations: Not on file    Relationship status: Not on file  Other Topics Concern  . Not on file  Social History Narrative    Patient is a widow. She has 11 children 5 of who are living. She is retired in 1993 from [employer]. She denies tobacco alcohol or drug use.   Family History  Problem Relation Age of Onset  . Heart disease Mother   . Diabetes Mother   . Hyperlipidemia Mother   . Hypertension Mother   . Heart disease Father   . Diabetes Father   .  Hyperlipidemia Father   . Hypertension Father     ASSESSMENT Recent Results: The most recent result is correlated with 42.5 mg per week: Lab Results  Component Value Date   INR 2.80 10/31/2017   INR 2.00 09/27/2017   INR 2.20 08/29/2017    Anticoagulation Dosing: Description   Take 1 tablet by mouth once-daily at 6PM on all days of the week--EXCEPT on Wednesdays and Fridays--take 1 & 1/2 tablets of your '5mg'$  peach colored  warfarin tablets on Wednesdays and Fridays.         INR today: Therapeutic  PLAN Weekly dose was decreased by 6% to 40 mg per week  Patient Instructions  Patient instructed to take medications as defined in the Anti-coagulation Track section of this encounter.  Patient instructed to take today's dose.  Patient instructed to take 1 tablet by mouth once-daily at [facility] on all days of the week--EXCEPT on Wednesdays and Fridays--take 1 & 1/2 tablets of your '5mg'$  peach colored warfarin tablets on Wednesdays and Fridays. Patient verbalized understanding of these instructions.     Patient advised to contact clinic or seek medical attention if signs/symptoms of bleeding or thromboembolism occur.  Patient verbalized understanding by repeating back information and was advised to contact me if further medication-related questions arise. Patient was also provided an information handout.  Follow-up Return in 1 month (on 11/28/2017) for Follow up INR at 3:30PM.  Pennie Banter, PharmD, CACP, CPP  15 minutes spent face-to-face with the patient during the encounter. 50% of time spent on education. 50% of time was spent on fingerstick INR sample collection, processing, results, dose adjustment and documentation.

## 2017-10-31 NOTE — Patient Instructions (Signed)
Patient instructed to take medications as defined in the Anti-coagulation Track section of this encounter.  Patient instructed to take today's dose.  Patient instructed to take 1 tablet by mouth once-daily at [facility] on all days of the week--EXCEPT on Wednesdays and Fridays--take 1 & 1/2 tablets of your 5mg  peach colored warfarin tablets on Wednesdays and Fridays. Patient verbalized understanding of these instructions.

## 2017-10-31 NOTE — Telephone Encounter (Signed)
Warfarin was sent as "print"  Rx called in to [facility] Higher education careers adviser)

## 2017-11-01 NOTE — Progress Notes (Signed)
Indication: Deep venous thrombosis and prior CVAs. Duration: Indefinite. INR: At target. Agree with Dr. Gladstone Pih assessment and plan.

## 2017-11-02 ENCOUNTER — Telehealth: Payer: Self-pay | Admitting: *Deleted

## 2017-11-02 NOTE — Telephone Encounter (Signed)
Pharm called to verify warfarin dose and #

## 2017-11-18 ENCOUNTER — Telehealth: Payer: Self-pay | Admitting: Internal Medicine

## 2017-11-18 NOTE — Telephone Encounter (Signed)
[employer] called requesting Records.  Unable to assist until information has been faxed.

## 2017-11-28 ENCOUNTER — Other Ambulatory Visit: Payer: Self-pay | Admitting: Internal Medicine

## 2017-11-28 ENCOUNTER — Ambulatory Visit (INDEPENDENT_AMBULATORY_CARE_PROVIDER_SITE_OTHER): Payer: Medicare Other | Admitting: Pharmacist

## 2017-11-28 ENCOUNTER — Telehealth: Payer: Self-pay | Admitting: Pharmacist

## 2017-11-28 DIAGNOSIS — Z7901 Long term (current) use of anticoagulants: Secondary | ICD-10-CM

## 2017-11-28 DIAGNOSIS — Z5181 Encounter for therapeutic drug level monitoring: Secondary | ICD-10-CM

## 2017-11-28 DIAGNOSIS — Z86718 Personal history of other venous thrombosis and embolism: Secondary | ICD-10-CM

## 2017-11-28 LAB — POCT INR: INR: 2.8

## 2017-11-28 NOTE — Patient Instructions (Signed)
Patient instructed to take medications as defined in the Anti-coagulation Track section of this encounter.  Patient instructed to take today's dose.  Patient instructed to take 1 tablet of your 5mg  peach-colored warfarin tablets by mouth once-daily at [facility]. Patient verbalized understanding of these instructions.

## 2017-11-28 NOTE — Telephone Encounter (Signed)
Requested patient come ealier for appointment if possible. She agrees to do so.

## 2017-11-28 NOTE — Progress Notes (Signed)
Anticoagulation Management Chelsea Mcdaniel is a 84 y.o. female who reports to the clinic for monitoring of warfarin treatment.    Indication: DVT, history of;  Long term current use of anticoagulants.   Duration: indefinite   Supervising physician: Aldine Contes  Anticoagulation Clinic Visit History: Patient does not report signs/symptoms of bleeding or thromboembolism  Other recent changes: No diet, medications, lifestyle changes endorsed to me by the patient or her D-I-L.  Anticoagulation Episode Summary    Current INR goal:   2.0-3.0  TTR:   72.0 % (3.9 y)  Next INR check:   12/26/2017  INR from last check:   2.80 (11/28/2017)  Weekly max warfarin dose:     Target end date:     INR check location:     Preferred lab:     Send INR reminders to:      Indications   EMBOLISM/THROMBOSIS DEEP VSL LWR EXTRM NOS (Resolved) [I82.409] Long-term (current) use of anticoagulants [Z79.01]       Comments:           Allergies  Allergen Reactions  . Diltiazem Other (See Comments)    Symptomatic bradaycardia  . Metoprolol Other (See Comments)    Symptomatic bradycardia   Prior to Admission medications   Medication Sig Start Date End Date Taking? Authorizing Provider  ACCU-CHEK SOFTCLIX LANCETS lancets Used to check blood sugar 3 times daily. Dx code E11.8 06/08/17  Yes Chundi, [name], MD  allopurinol (ZYLOPRIM) 100 MG tablet TAKE ONE AND A HALF TABLETS BY MOUTH EVERY DAY 10/31/17  Yes Chundi, [name], MD  Blood Glucose Monitoring Suppl (ACCU-CHEK AVIVA PLUS) w/Device KIT Check blood sugar 1 time a day 09/30/16  Yes Patel, Rayne Du, MD  Calcium Carbonate-Vitamin D 600-400 MG-UNIT per tablet Take 1 tablet by mouth 2 (two) times daily.   Yes [provider]  cloNIDine (CATAPRES - DOSED IN MG/24 HR) 0.3 mg/24hr patch Place 1 patch (0.3 mg total) onto the skin once a week. 06/20/17  Yes Rice, Resa Miner, MD  dorzolamide-timolol (COSOPT) 22.3-6.8 MG/ML ophthalmic solution Place 1 drop  into both eyes 2 (two) times daily. 01/21/16  Yes [provider]  furosemide (LASIX) 10 MG/ML solution Take 8 mLs (80 mg total) by mouth daily. 09/06/17  Yes Chundi, [name], MD  glucose blood (ACCU-CHEK AVIVA PLUS) test strip USE 1 TIME DAILY TO CHECK BLOOD SUGAR 09/03/17  Yes Chundi, [name], MD  glucose blood (ACCU-CHEK AVIVA) test strip Use as instructed 05/03/11 09/03/18 Yes Sharda, Neema K, MD  GNP STOOL SOFTENER/LAXATIVE 8.6-50 MG tablet TAKE 2 TABLETS BY MOUTH AT BEDTIME AS NEEDED FOR CONSTIPATION 07/16/16  Yes Sid Falcon, MD  JANUVIA 25 MG tablet TAKE 1 TABLET BY MOUTH EVERY DAY 10/04/17  Yes Chundi, [name], MD  Lancets Misc. (ACCU-CHEK SOFTCLIX LANCET DEV) KIT Use to check blood sugar one time a day 09/03/17  Yes Chundi, [name], MD  LUMIGAN 0.01 % SOLN Place 1 drop into both eyes every morning.  12/05/13  Yes [provider]  polyethylene glycol (MIRALAX) packet Take 17 g by mouth 2 (two) times daily. 03/25/17  Yes [name], MD  polyethylene glycol powder (GLYCOLAX/MIRALAX) powder Take 17 g by mouth 2 (two) times daily. 03/25/17  Yes Bartholomew Crews, MD  warfarin (COUMADIN) 5 MG tablet Take 1 tablet by mouth once-daily at [facility] on all days of the week--EXCEPT on Wednesdays and Fridays--take 1 & 1/2 tablets of your '5mg'$  peach colored warfarin tablets on Wednesdays and  Fridays 10/31/17  Yes Chundi, [name], MD  hydrALAZINE (APRESOLINE) 50 MG tablet Take 1 tablet (50 mg total) by mouth 3 (three) times daily. 08/12/17 09/11/17  [name], MD   Past Medical History:  Diagnosis Date  . Anemia     normocytic anemia with baseline hemoglobin 10-11  . Blindness of left eye     likely related to stroke, left cataract removed from that eye with complications  . Calculus gallbladder and bile duct with cholecystitis with obstruction 01/2015  . Chronic kidney disease 2006    left renal artery stenosis with probable hemodynamic significance, kidneys are normal in morphology  without focal lesions or hydronephrosis this is based but cannot A. of the abdomen with and without contrast done on the 31st 2006  . CKD (chronic kidney disease), stage III ([place])   . CVA (cerebral vascular accident) ([place]) 1990's  . CVA (cerebrovascular accident) [facility])  October 2007    CT of the head atrophy with multiple remote insults noted but no definite acute findings  . Glaucoma   . Gout   . Hyperlipidemia   . Hypertension   . Personal history of DVT (deep vein thrombosis) 08/11/2010   BLE  . Type II diabetes mellitus ([place])    Social History   Socioeconomic History  . Marital status: Widowed    Spouse name: Not on file  . Number of children: Not on file  . Years of education: Not on file  . Highest education level: Not on file  Occupational History  . Not on file  Social Needs  . Financial resource strain: Not on file  . Food insecurity:    Worry: Not on file    Inability: Not on file  . Transportation needs:    Medical: Not on file    Non-medical: Not on file  Tobacco Use  . Smoking status: Never Smoker  . Smokeless tobacco: Never Used  Substance and Sexual Activity  . Alcohol use: No    Alcohol/week: 0.0 oz  . Drug use: No  . Sexual activity: Never  Lifestyle  . Physical activity:    Days per week: Not on file    Minutes per session: Not on file  . Stress: Not on file  Relationships  . Social connections:    Talks on phone: Not on file    Gets together: Not on file    Attends religious service: Not on file    Active member of club or organization: Not on file    Attends meetings of clubs or organizations: Not on file    Relationship status: Not on file  Other Topics Concern  . Not on file  Social History Narrative    Patient is a widow. She has 11 children 5 of who are living. She is retired in 1993 from [employer]. She denies tobacco alcohol or drug use.   Family History  Problem Relation Age of Onset  . Heart disease Mother   . Diabetes Mother    . Hyperlipidemia Mother   . Hypertension Mother   . Heart disease Father   . Diabetes Father   . Hyperlipidemia Father   . Hypertension Father     ASSESSMENT Recent Results: The most recent result is correlated with 40 mg per week: Lab Results  Component Value Date   INR 2.80 11/28/2017   INR 2.80 10/31/2017   INR 2.00 09/27/2017    Anticoagulation Dosing: Description   Take 1 tablet by mouth once-daily at 6PM.  INR today: Therapeutic  PLAN Weekly dose was decreased by 12% to 35 mg per week  Patient Instructions  Patient instructed to take medications as defined in the Anti-coagulation Track section of this encounter.  Patient instructed to take today's dose.  Patient instructed to take 1 tablet of your 32m peach-colored warfarin tablets by [facility] at [facility] Patient verbalized understanding of these instructions.     Patient advised to contact clinic or seek medical attention if signs/symptoms of bleeding or thromboembolism occur.  Patient verbalized understanding by repeating back information and was advised to contact me if further medication-related questions arise. Patient was also provided an information handout.  Follow-up Return in 1 month (on 12/26/2017) for Follow up INR at 2:45PM.  JPennie Banter PharmD, CACP, CPP  15 minutes spent face-to-face with the patient during the encounter. 50% of time spent on education. 50% of time was spent on fingerstick point of care INR sample collection, processing, results determination, dose adjustment and documentation in [email or web address]

## 2017-12-01 NOTE — Progress Notes (Signed)
INTERNAL MEDICINE TEACHING ATTENDING ADDENDUM - Aldine Contes M.D  Duration- indefinite, Indication- DVT, recurrent CVA, INR- therapeutic. Agree with pharmacy recommendations as outlined in their note.

## 2017-12-09 NOTE — Telephone Encounter (Signed)
See documentation from anticoagulation clinic visit 11/28/17.

## 2017-12-14 DIAGNOSIS — I129 Hypertensive chronic kidney disease with stage 1 through stage 4 chronic kidney disease, or unspecified chronic kidney disease: Secondary | ICD-10-CM | POA: Diagnosis not present

## 2017-12-14 DIAGNOSIS — R600 Localized edema: Secondary | ICD-10-CM | POA: Diagnosis not present

## 2017-12-14 DIAGNOSIS — R809 Proteinuria, unspecified: Secondary | ICD-10-CM | POA: Diagnosis not present

## 2017-12-14 DIAGNOSIS — N184 Chronic kidney disease, stage 4 (severe): Secondary | ICD-10-CM | POA: Diagnosis not present

## 2017-12-14 DIAGNOSIS — N2581 Secondary hyperparathyroidism of renal origin: Secondary | ICD-10-CM | POA: Diagnosis not present

## 2017-12-26 ENCOUNTER — Ambulatory Visit (INDEPENDENT_AMBULATORY_CARE_PROVIDER_SITE_OTHER): Payer: Medicare Other | Admitting: Pharmacist

## 2017-12-26 ENCOUNTER — Other Ambulatory Visit: Payer: Self-pay | Admitting: Internal Medicine

## 2017-12-26 DIAGNOSIS — Z86718 Personal history of other venous thrombosis and embolism: Secondary | ICD-10-CM | POA: Diagnosis not present

## 2017-12-26 DIAGNOSIS — Z7901 Long term (current) use of anticoagulants: Secondary | ICD-10-CM | POA: Diagnosis not present

## 2017-12-26 DIAGNOSIS — I82409 Acute embolism and thrombosis of unspecified deep veins of unspecified lower extremity: Secondary | ICD-10-CM

## 2017-12-26 LAB — POCT INR: INR: 2.8 (ref 2.0–3.0)

## 2017-12-26 NOTE — Patient Instructions (Signed)
Patient instructed to take medications as defined in the Anti-coagulation Track section of this encounter.  Patient instructed to take today's dose.  Patient instructed to take 1/2 tablet of your 5mg  peach-colored warfarin tablets on Mondays and Thursdays; ALL OTHER DAYS--take one (1) tablet.  Patient verbalized understanding of these instructions.

## 2017-12-26 NOTE — Progress Notes (Signed)
Anticoagulation Management Chelsea Mcdaniel is a 84 y.o. female who reports to the clinic for monitoring of warfarin treatment.    Indication: DVT, deep VSL lower extremity NOS (resolved) [182.409]; Long-term current use of anticoagulants [Z79.01]. Duration: indefinite Supervising physician: [place] [facility] Visit History: Patient does not report signs/symptoms of bleeding or thromboembolism  Other recent changes: No diet, medications, lifestyle changes endorsed.  Anticoagulation Episode Summary    Current INR goal:   2.0-3.0  TTR:   72.5 % (4 y)  Next INR check:   01/16/2018  INR from last check:   2.8 (12/26/2017)  Weekly max warfarin dose:     Target end date:     INR check location:     Preferred lab:     Send INR reminders to:      Indications   EMBOLISM/THROMBOSIS DEEP VSL LWR EXTRM NOS (Resolved) [I82.409] Long-term (current) use of anticoagulants [Z79.01]       Comments:           Allergies  Allergen Reactions  . Diltiazem Other (See Comments)    Symptomatic bradaycardia  . Metoprolol Other (See Comments)    Symptomatic bradycardia   Prior to Admission medications   Medication Sig Start Date End Date Taking? Authorizing Provider  ACCU-CHEK SOFTCLIX LANCETS lancets Used to check blood sugar 3 times daily. Dx code E11.8 06/08/17  Yes Chundi, [name], MD  allopurinol (ZYLOPRIM) 100 MG tablet TAKE ONE AND A HALF TABLETS BY MOUTH EVERY DAY 10/31/17  Yes Chundi, [name], MD  Blood Glucose Monitoring Suppl (ACCU-CHEK AVIVA PLUS) w/Device KIT Check blood sugar 1 time a day 09/30/16  Yes Patel, Rayne Du, MD  Calcium Carbonate-Vitamin D 600-400 MG-UNIT per tablet Take 1 tablet by mouth 2 (two) times daily.   Yes [provider]  cloNIDine (CATAPRES - DOSED IN MG/24 HR) 0.3 mg/24hr patch Place 1 patch (0.3 mg total) onto the skin once a week. 06/20/17  Yes Rice, Resa Miner, MD  dorzolamide-timolol (COSOPT) 22.3-6.8 MG/ML ophthalmic solution Place  1 drop into both eyes 2 (two) times daily. 01/21/16  Yes [provider]  furosemide (LASIX) 10 MG/ML solution Take 8 mLs (80 mg total) by mouth daily. 09/06/17  Yes Chundi, [name], MD  glucose blood (ACCU-CHEK AVIVA PLUS) test strip USE 1 TIME DAILY TO CHECK BLOOD SUGAR 09/03/17  Yes Chundi, [name], MD  glucose blood (ACCU-CHEK AVIVA) test strip Use as instructed 05/03/11 09/03/18 Yes Sharda, Neema K, MD  GNP STOOL SOFTENER/LAXATIVE 8.6-50 MG tablet TAKE 2 TABLETS BY MOUTH AT BEDTIME AS NEEDED FOR CONSTIPATION 07/16/16  Yes Sid Falcon, MD  JANUVIA 25 MG tablet TAKE 1 TABLET BY MOUTH EVERY DAY 10/04/17  Yes Chundi, [name], MD  Lancets Misc. (ACCU-CHEK SOFTCLIX LANCET DEV) KIT Use to check blood sugar one time a day 09/03/17  Yes Chundi, [name], MD  LUMIGAN 0.01 % SOLN Place 1 drop into both eyes every morning.  12/05/13  Yes [provider]  polyethylene glycol (MIRALAX) packet Take 17 g by mouth 2 (two) times daily. 03/25/17  Yes [name], MD  polyethylene glycol powder (GLYCOLAX/MIRALAX) powder Take 17 g by mouth 2 (two) times daily. 03/25/17  Yes Bartholomew Crews, MD  warfarin (COUMADIN) 5 MG tablet Take 1 tablet by mouth once-daily at [facility] on all days of the week--EXCEPT on Wednesdays and Fridays--take 1 & 1/2 tablets of your 34m peach colored warfarin tablets on Wednesdays and Fridays 10/31/17  Yes CLars Mage MD  hydrALAZINE (APRESOLINE) 50 MG tablet Take 1 tablet (50 mg total) by mouth 3 (three) times daily. 08/12/17 09/11/17  [name], MD   Past Medical History:  Diagnosis Date  . Anemia     normocytic anemia with baseline hemoglobin 10-11  . Blindness of left eye     likely related to stroke, left cataract removed from that eye with complications  . Calculus gallbladder and bile duct with cholecystitis with obstruction 01/2015  . Chronic kidney disease 2006    left renal artery stenosis with probable hemodynamic significance, kidneys are normal in  morphology without focal lesions or hydronephrosis this is based but cannot A. of the abdomen with and without contrast done on the 31st 2006  . CKD (chronic kidney disease), stage III ([place])   . CVA (cerebral vascular accident) ([place]) 1990's  . CVA (cerebrovascular accident) [facility])  October 2007    CT of the head atrophy with multiple remote insults noted but no definite acute findings  . Glaucoma   . Gout   . Hyperlipidemia   . Hypertension   . Personal history of DVT (deep vein thrombosis) 08/11/2010   BLE  . Type II diabetes mellitus ([place])    Social History   Socioeconomic History  . Marital status: Widowed    Spouse name: Not on file  . Number of children: Not on file  . Years of education: Not on file  . Highest education level: Not on file  Occupational History  . Not on file  Social Needs  . Financial resource strain: Not on file  . Food insecurity:    Worry: Not on file    Inability: Not on file  . Transportation needs:    Medical: Not on file    Non-medical: Not on file  Tobacco Use  . Smoking status: Never Smoker  . Smokeless tobacco: Never Used  Substance and Sexual Activity  . Alcohol use: No    Alcohol/week: 0.0 oz  . Drug use: No  . Sexual activity: Never  Lifestyle  . Physical activity:    Days per week: Not on file    Minutes per session: Not on file  . Stress: Not on file  Relationships  . Social connections:    Talks on phone: Not on file    Gets together: Not on file    Attends religious service: Not on file    Active member of club or organization: Not on file    Attends meetings of clubs or organizations: Not on file    Relationship status: Not on file  Other Topics Concern  . Not on file  Social History Narrative    Patient is a widow. She has 11 children 5 of who are living. She is retired in 1993 from [employer]. She denies tobacco alcohol or drug use.   Family History  Problem Relation Age of Onset  . Heart disease Mother   .  Diabetes Mother   . Hyperlipidemia Mother   . Hypertension Mother   . Heart disease Father   . Diabetes Father   . Hyperlipidemia Father   . Hypertension Father     ASSESSMENT Recent Results: The most recent result is correlated with 35 mg per week: Lab Results  Component Value Date   INR 2.8 12/26/2017   INR 2.80 11/28/2017   INR 2.80 10/31/2017    Anticoagulation Dosing: Description   Take 1 tablet by mouth once-daily at 6PM.     INR today: Therapeutic  PLAN Weekly dose was decreased by 14% to 30 mg per week  Patient Instructions  Patient instructed to take medications as defined in the Anti-coagulation Track section of this encounter.  Patient instructed to take today's dose.  Patient instructed to take 1/2 tablet of your 31m peach-colored warfarin tablets on Mondays and Thursdays; ALL OTHER DAYS--take one (1) tablet.  Patient verbalized understanding of these instructions.     Patient advised to contact clinic or seek medical attention if signs/symptoms of bleeding or thromboembolism occur.  Patient verbalized understanding by repeating back information and was advised to contact me if further medication-related questions arise. Patient was also provided an information handout.  Follow-up Return in 3 weeks (on 01/16/2018) for Follow up INR at [facility] PharmD, CACP, CPP  15 minutes spent face-to-face with the patient during the encounter. 50% of time spent on education. 50% of time was spent on fingerstick point of care INR sample collection, processing, results determination, dose adjustment and documentation in [email or web address]

## 2017-12-29 ENCOUNTER — Telehealth: Payer: Self-pay | Admitting: Internal Medicine

## 2017-12-29 MED ORDER — WARFARIN SODIUM 5 MG PO TABS: ORAL_TABLET | ORAL | 1 refills | Status: DC

## 2017-12-29 NOTE — Telephone Encounter (Signed)
Patient is requesting refills on Warfain, pharmacy told patient they have faxed it twice and no response, pls send to [facility]

## 2017-12-30 NOTE — Telephone Encounter (Signed)
"  Print" option selected on warfarin Rx written yesterday. Called into [facility]. Hubbard Hartshorn, RN, BSN

## 2018-01-16 ENCOUNTER — Ambulatory Visit (INDEPENDENT_AMBULATORY_CARE_PROVIDER_SITE_OTHER): Payer: Medicare Other | Admitting: Pharmacist

## 2018-01-16 DIAGNOSIS — Z7901 Long term (current) use of anticoagulants: Secondary | ICD-10-CM

## 2018-01-16 DIAGNOSIS — Z86718 Personal history of other venous thrombosis and embolism: Secondary | ICD-10-CM | POA: Diagnosis not present

## 2018-01-16 LAB — POCT INR: INR: 2.4 (ref 2.0–3.0)

## 2018-01-16 NOTE — Progress Notes (Signed)
Anticoagulation Management Chelsea Mcdaniel is a 84 y.o. female who reports to the clinic for monitoring of warfarin treatment.    Indication: DVT, history of; long term current use of anticoagulant.  Duration: indefinite Supervising physician: [place] [facility] Visit History: Patient does not report signs/symptoms of bleeding or thromboembolism  Other recent changes: No diet, medications, lifestyle endorsed.  Anticoagulation Episode Summary    Current INR goal:   2.0-3.0  TTR:   72.9 % (4 y)  Next INR check:   02/13/2018  INR from last check:   2.4 (01/16/2018)  Weekly max warfarin dose:     Target end date:     INR check location:     Preferred lab:     Send INR reminders to:      Indications   EMBOLISM/THROMBOSIS DEEP VSL LWR EXTRM NOS (Resolved) [I82.409] Long-term (current) use of anticoagulants [Z79.01]       Comments:           Allergies  Allergen Reactions  . Diltiazem Other (See Comments)    Symptomatic bradaycardia  . Metoprolol Other (See Comments)    Symptomatic bradycardia   Prior to Admission medications   Medication Sig Start Date End Date Taking? Authorizing Provider  ACCU-CHEK SOFTCLIX LANCETS lancets Used to check blood sugar 3 times daily. Dx code E11.8 06/08/17  Yes Chundi, [name], MD  allopurinol (ZYLOPRIM) 100 MG tablet TAKE ONE AND A HALF TABLETS BY MOUTH EVERY DAY 10/31/17  Yes Chundi, [name], MD  Blood Glucose Monitoring Suppl (ACCU-CHEK AVIVA PLUS) w/Device KIT Check blood sugar 1 time a day 09/30/16  Yes Patel, Rayne Du, MD  Calcium Carbonate-Vitamin D 600-400 MG-UNIT per tablet Take 1 tablet by mouth 2 (two) times daily.   Yes [provider]  cloNIDine (CATAPRES - DOSED IN MG/24 HR) 0.3 mg/24hr patch Place 1 patch (0.3 mg total) onto the skin once a week. 06/20/17  Yes Rice, Resa Miner, MD  dorzolamide-timolol (COSOPT) 22.3-6.8 MG/ML ophthalmic solution Place 1 drop into both eyes 2 (two) times daily. 01/21/16  Yes  [provider]  furosemide (LASIX) 10 MG/ML solution Take 8 mLs (80 mg total) by mouth daily. 09/06/17  Yes Chundi, [name], MD  glucose blood (ACCU-CHEK AVIVA PLUS) test strip USE 1 TIME DAILY TO CHECK BLOOD SUGAR 09/03/17  Yes Chundi, [name], MD  glucose blood (ACCU-CHEK AVIVA) test strip Use as instructed 05/03/11 09/03/18 Yes Sharda, Neema K, MD  GNP STOOL SOFTENER/LAXATIVE 8.6-50 MG tablet TAKE 2 TABLETS BY MOUTH AT BEDTIME AS NEEDED FOR CONSTIPATION 07/16/16  Yes Sid Falcon, MD  JANUVIA 25 MG tablet TAKE 1 TABLET BY MOUTH EVERY DAY 10/04/17  Yes Chundi, [name], MD  Lancets Misc. (ACCU-CHEK SOFTCLIX LANCET DEV) KIT Use to check blood sugar one time a day 09/03/17  Yes Chundi, [name], MD  LUMIGAN 0.01 % SOLN Place 1 drop into both eyes every morning.  12/05/13  Yes [provider]  polyethylene glycol (MIRALAX) packet Take 17 g by mouth 2 (two) times daily. 03/25/17  Yes [name], MD  polyethylene glycol powder (GLYCOLAX/MIRALAX) powder Take 17 g by mouth 2 (two) times daily. 03/25/17  Yes Bartholomew Crews, MD  warfarin (COUMADIN) 5 MG tablet Take 1 tablet by mouth once-daily at [facility] on all days of the week--EXCEPT on MONDAYS and THURSDAYS--take  1/2 OF a tablet of your 61m peach colored warfarin tablets on MONDAYS AND THURSDAYS 12/29/17  Yes Chundi, [name], MD  hydrALAZINE (APRESOLINE) 50 MG  tablet Take 1 tablet (50 mg total) by mouth 3 (three) times daily. 08/12/17 09/11/17  [name], MD   Past Medical History:  Diagnosis Date  . Anemia     normocytic anemia with baseline hemoglobin 10-11  . Blindness of left eye     likely related to stroke, left cataract removed from that eye with complications  . Calculus gallbladder and bile duct with cholecystitis with obstruction 01/2015  . Chronic kidney disease 2006    left renal artery stenosis with probable hemodynamic significance, kidneys are normal in morphology without focal lesions or hydronephrosis this is based  but cannot A. of the abdomen with and without contrast done on the 31st 2006  . CKD (chronic kidney disease), stage III ([place])   . CVA (cerebral vascular accident) ([name]) 1990's  . CVA (cerebrovascular accident) [facility])  October 2007    CT of the head atrophy with multiple remote insults noted but no definite acute findings  . Glaucoma   . Gout   . Hyperlipidemia   . Hypertension   . Personal history of DVT (deep vein thrombosis) 08/11/2010   BLE  . Type II diabetes mellitus ([place])    Social History   Socioeconomic History  . Marital status: Widowed    Spouse name: Not on file  . Number of children: Not on file  . Years of education: Not on file  . Highest education level: Not on file  Occupational History  . Not on file  Social Needs  . Financial resource strain: Not on file  . Food insecurity:    Worry: Not on file    Inability: Not on file  . Transportation needs:    Medical: Not on file    Non-medical: Not on file  Tobacco Use  . Smoking status: Never Smoker  . Smokeless tobacco: Never Used  Substance and Sexual Activity  . Alcohol use: No    Alcohol/week: 0.0 oz  . Drug use: No  . Sexual activity: Never  Lifestyle  . Physical activity:    Days per week: Not on file    Minutes per session: Not on file  . Stress: Not on file  Relationships  . Social connections:    Talks on phone: Not on file    Gets together: Not on file    Attends religious service: Not on file    Active member of club or organization: Not on file    Attends meetings of clubs or organizations: Not on file    Relationship status: Not on file  Other Topics Concern  . Not on file  Social History Narrative    Patient is a widow. She has 11 children 5 of who are living. She is retired in 1993 from [employer]. She denies tobacco alcohol or drug use.   Family History  Problem Relation Age of Onset  . Heart disease Mother   . Diabetes Mother   . Hyperlipidemia Mother   . Hypertension Mother    . Heart disease Father   . Diabetes Father   . Hyperlipidemia Father   . Hypertension Father     ASSESSMENT Recent Results: The most recent result is correlated with 30 mg per week: Lab Results  Component Value Date   INR 2.4 01/16/2018   INR 2.8 12/26/2017   INR 2.80 11/28/2017    Anticoagulation Dosing: Description   Take 1 tablet by mouth once-daily at 6PM all days of week, EXCEPT on Mondays and Thursdays, take only 1/2  tablet.      INR today: Therapeutic  PLAN Weekly dose was unchanged.   Patient Instructions  Patient instructed to take medications as defined in the Anti-coagulation Track section of this encounter.  Patient instructed to take today's dose.  Patient instructed to take 1 tablet by mouth once-daily at 6PM all days of week, EXCEPT on Mondays and Thursdays, take only 1/2 tablet.  Patient verbalized understanding of these instructions.     Patient advised to contact clinic or seek medical attention if signs/symptoms of bleeding or thromboembolism occur.  Patient verbalized understanding by repeating back information and was advised to contact me if further medication-related questions arise. Patient was also provided an information handout.  Follow-up Return in 1 month (on 02/13/2018) for Follow up INR at [place], PharmD, CACP, CPP  15 minutes spent face-to-face with the patient during the encounter. 50% of time spent on education. 50% of time was spent on fingerstick point of care INR sample collection, processing, results determination and documentation in [email or web address].

## 2018-01-16 NOTE — Patient Instructions (Signed)
Patient instructed to take medications as defined in the Anti-coagulation Track section of this encounter.  Patient instructed to take today's dose.  Patient instructed to take 1 tablet by mouth once-daily at 6PM all days of week, EXCEPT on Mondays and Thursdays, take only 1/2 tablet.  Patient verbalized understanding of these instructions.

## 2018-01-20 NOTE — Progress Notes (Signed)
I reviewed Dr. Gladstone Pih note, patient is on [facility] for DVT, chronic.  INR at goal.

## 2018-01-23 ENCOUNTER — Other Ambulatory Visit: Payer: Self-pay | Admitting: Internal Medicine

## 2018-01-23 ENCOUNTER — Other Ambulatory Visit: Payer: Self-pay | Admitting: Oncology

## 2018-02-13 ENCOUNTER — Ambulatory Visit (INDEPENDENT_AMBULATORY_CARE_PROVIDER_SITE_OTHER): Payer: Medicare Other | Admitting: Pharmacist

## 2018-02-13 DIAGNOSIS — Z5181 Encounter for therapeutic drug level monitoring: Secondary | ICD-10-CM

## 2018-02-13 DIAGNOSIS — Z86718 Personal history of other venous thrombosis and embolism: Secondary | ICD-10-CM

## 2018-02-13 DIAGNOSIS — Z7901 Long term (current) use of anticoagulants: Secondary | ICD-10-CM | POA: Diagnosis not present

## 2018-02-13 LAB — POCT INR: INR: 2.1 (ref 2.0–3.0)

## 2018-02-13 NOTE — Progress Notes (Signed)
I have reviewed Dr. Gladstone Pih note. Patient is on [facility] for recurrent DVT and INR was trending down, so dose increased.

## 2018-02-13 NOTE — Patient Instructions (Signed)
Patient instructed to take medications as defined in the Anti-coagulation Track section of this encounter.  Patient instructed to take today's dose.  Patient instructed to take 1 tablet by mouth once-daily at 6PM all days of week, EXCEPT on [facility]. Take only 1/2 tablet on [facility].   Patient verbalized understanding of these instructions.

## 2018-02-13 NOTE — Progress Notes (Signed)
Anticoagulation Management Chelsea Mcdaniel is a 84 y.o. female who reports to the clinic for monitoring of warfarin treatment.    Indication: DVT, history of; long term current use of anticoagulant.   Duration: indefinite Supervising physician: [facility] Visit History: Patient does not report signs/symptoms of bleeding or thromboembolism  Other recent changes: No diet, medications, lifestyle changes endorsed.  Anticoagulation Episode Summary    Current INR goal:   2.0-3.0  TTR:   73.4 % (4.1 y)  Next INR check:   03/13/2018  INR from last check:     Weekly max warfarin dose:     Target end date:     INR check location:     Preferred lab:     Send INR reminders to:      Indications   EMBOLISM/THROMBOSIS DEEP VSL LWR EXTRM NOS (Resolved) [I82.409] Long-term (current) use of anticoagulants [Z79.01]       Comments:           Allergies  Allergen Reactions  . Diltiazem Other (See Comments)    Symptomatic bradaycardia  . Metoprolol Other (See Comments)    Symptomatic bradycardia   Prior to Admission medications   Medication Sig Start Date End Date Taking? Authorizing Provider  ACCU-CHEK SOFTCLIX LANCETS lancets Used to check blood sugar 3 times daily. Dx code E11.8 06/08/17  Yes Chundi, [name], MD  allopurinol (ZYLOPRIM) 100 MG tablet TAKE ONE AND A HALF TABLETS BY MOUTH EVERY DAY 10/31/17  Yes Chundi, [name], MD  Blood Glucose Monitoring Suppl (ACCU-CHEK AVIVA PLUS) w/Device KIT Check blood sugar 1 time a day 09/30/16  Yes Patel, Rayne Du, MD  Calcium Carbonate-Vitamin D 600-400 MG-UNIT per tablet Take 1 tablet by mouth 2 (two) times daily.   Yes [provider]  cloNIDine (CATAPRES - DOSED IN MG/24 HR) 0.3 mg/24hr patch Place 1 patch (0.3 mg total) onto the skin once a week. 06/20/17  Yes Rice, Resa Miner, MD  dorzolamide-timolol (COSOPT) 22.3-6.8 MG/ML ophthalmic solution Place 1 drop into both eyes 2 (two) times daily. 01/21/16  Yes  [provider]  furosemide (LASIX) 10 MG/ML solution Take 8 mLs (80 mg total) by mouth daily. 09/06/17  Yes Chundi, [name], MD  glucose blood (ACCU-CHEK AVIVA PLUS) test strip USE 1 TIME DAILY TO CHECK BLOOD SUGAR 09/03/17  Yes Chundi, [name], MD  glucose blood (ACCU-CHEK AVIVA) test strip Use as instructed 05/03/11 09/03/18 Yes Sharda, Neema K, MD  GNP STOOL SOFTENER/LAXATIVE 8.6-50 MG tablet TAKE 2 TABLETS BY MOUTH AT BEDTIME AS NEEDED FOR CONSTIPATION 07/16/16  Yes Sid Falcon, MD  JANUVIA 25 MG tablet TAKE 1 TABLET BY MOUTH EVERY DAY 10/04/17  Yes Chundi, [name], MD  Lancets Misc. (ACCU-CHEK SOFTCLIX LANCET DEV) KIT Use to check blood sugar one time a day 09/03/17  Yes Chundi, [name], MD  LUMIGAN 0.01 % SOLN Place 1 drop into both eyes every morning.  12/05/13  Yes [provider]  polyethylene glycol (MIRALAX) packet Take 17 g by mouth 2 (two) times daily. 03/25/17  Yes [name], MD  polyethylene glycol powder (GLYCOLAX/MIRALAX) powder Take 17 g by mouth 2 (two) times daily. 03/25/17  Yes Bartholomew Crews, MD  warfarin (COUMADIN) 5 MG tablet TAKE 1 TABLET BY MOUTH EVERY DAY AT 6 pm EXCEPT TAKE 1/2 TABLET ON monday AND thursday 01/23/18  Yes Chundi, [name], MD  warfarin (COUMADIN) 5 MG tablet TAKE 1 TABLET BY MOUTH EVERY DAY AT 6 PM [place]  TAKE 1 & 1/2 TABLETS ON THOSE DAYS 01/23/18  Yes Chundi, [name], MD  hydrALAZINE (APRESOLINE) 50 MG tablet Take 1 tablet (50 mg total) by mouth 3 (three) times daily. 08/12/17 09/11/17  [name], MD   Past Medical History:  Diagnosis Date  . Anemia     normocytic anemia with baseline hemoglobin 10-11  . Blindness of left eye     likely related to stroke, left cataract removed from that eye with complications  . Calculus gallbladder and bile duct with cholecystitis with obstruction 01/2015  . Chronic kidney disease 2006    left renal artery stenosis with probable hemodynamic significance, kidneys are normal in  morphology without focal lesions or hydronephrosis this is based but cannot A. of the abdomen with and without contrast done on the 31st 2006  . CKD (chronic kidney disease), stage III ([place])   . CVA (cerebral vascular accident) ([place]) 1990's  . CVA (cerebrovascular accident) [facility])  October 2007    CT of the head atrophy with multiple remote insults noted but no definite acute findings  . Glaucoma   . Gout   . Hyperlipidemia   . Hypertension   . Personal history of DVT (deep vein thrombosis) 08/11/2010   BLE  . Type II diabetes mellitus ([place])    Social History   Socioeconomic History  . Marital status: Widowed    Spouse name: Not on file  . Number of children: Not on file  . Years of education: Not on file  . Highest education level: Not on file  Occupational History  . Not on file  Social Needs  . Financial resource strain: Not on file  . Food insecurity:    Worry: Not on file    Inability: Not on file  . Transportation needs:    Medical: Not on file    Non-medical: Not on file  Tobacco Use  . Smoking status: Never Smoker  . Smokeless tobacco: Never Used  Substance and Sexual Activity  . Alcohol use: No    Alcohol/week: 0.0 oz  . Drug use: No  . Sexual activity: Never  Lifestyle  . Physical activity:    Days per week: Not on file    Minutes per session: Not on file  . Stress: Not on file  Relationships  . Social connections:    Talks on phone: Not on file    Gets together: Not on file    Attends religious service: Not on file    Active member of club or organization: Not on file    Attends meetings of clubs or organizations: Not on file    Relationship status: Not on file  Other Topics Concern  . Not on file  Social History Narrative    Patient is a widow. She has 11 children 5 of who are living. She is retired in 1993 from [employer]. She denies tobacco alcohol or drug use.   Family History  Problem Relation Age of Onset  . Heart disease Mother   .  Diabetes Mother   . Hyperlipidemia Mother   . Hypertension Mother   . Heart disease Father   . Diabetes Father   . Hyperlipidemia Father   . Hypertension Father     ASSESSMENT Recent Results: The most recent result is correlated with 30 mg per week: Lab Results  Component Value Date   INR 2.1 02/13/2018   INR 2.4 01/16/2018   INR 2.8 12/26/2017    Anticoagulation Dosing: Description   Take  1 tablet by mouth once-daily at 6PM all days of week, EXCEPT on [facility]. Take only 1/2 tablet on [facility].       INR today: Therapeutic  PLAN Weekly dose was increased by 8% to 32.5 mg per week  Patient Instructions  Patient instructed to take medications as defined in the Anti-coagulation Track section of this encounter.  Patient instructed to take today's dose.  Patient instructed to take 1 tablet by mouth once-daily at 6PM all days of week, EXCEPT on [facility]. Take only 1/2 tablet on [facility].   Patient verbalized understanding of these instructions.     Patient advised to contact clinic or seek medical attention if signs/symptoms of bleeding or thromboembolism occur.  Patient verbalized understanding by repeating back information and was advised to contact me if further medication-related questions arise. Patient was also provided an information handout.  Follow-up Return in 1 month (on 03/13/2018) for Follow up INR at [place], PharmD, CACP, CPP  15 minutes spent face-to-face with the patient during the encounter. 50% of time spent on education. 50% of time was spent on fingerstick point of care INR sample collection, processing, results determination, dose adjustment and documentation in [email or web address].

## 2018-02-15 DIAGNOSIS — H2511 Age-related nuclear cataract, right eye: Secondary | ICD-10-CM | POA: Diagnosis not present

## 2018-02-15 DIAGNOSIS — H2702 Aphakia, left eye: Secondary | ICD-10-CM | POA: Diagnosis not present

## 2018-02-15 DIAGNOSIS — H401113 Primary open-angle glaucoma, right eye, severe stage: Secondary | ICD-10-CM | POA: Diagnosis not present

## 2018-02-15 DIAGNOSIS — H338 Other retinal detachments: Secondary | ICD-10-CM | POA: Diagnosis not present

## 2018-02-20 ENCOUNTER — Other Ambulatory Visit: Payer: Self-pay | Admitting: Oncology

## 2018-02-20 DIAGNOSIS — I10 Essential (primary) hypertension: Principal | ICD-10-CM

## 2018-02-20 DIAGNOSIS — E1159 Type 2 diabetes mellitus with other circulatory complications: Secondary | ICD-10-CM

## 2018-02-24 ENCOUNTER — Other Ambulatory Visit: Payer: Self-pay | Admitting: Oncology

## 2018-02-24 NOTE — Telephone Encounter (Signed)
I have not seen the patient recently. Please schedule her for an [facility] visit so I can fill her medication. Thank you!

## 2018-02-24 NOTE — Telephone Encounter (Signed)
I am okay with that so long as she has enough medication to last her to that appointment.

## 2018-02-24 NOTE — Telephone Encounter (Signed)
Per Epic, she has an appt 8/19 ,the same day as her Coumadin appt. Do we need to chane both appts?

## 2018-02-24 NOTE — Telephone Encounter (Signed)
Pt's med list has 0.3 mg ; the pharmacy is requesting 0.2 mg. Thanks

## 2018-02-27 MED ORDER — CLONIDINE 0.3 MG/24HR TD PTWK: 0.3000 mg | MEDICATED_PATCH | TRANSDERMAL | 0 refills | Status: DC

## 2018-02-27 NOTE — Telephone Encounter (Signed)
I had talked to pt's daughter on Friday ; stated pt will out of Clonidine by the time of her appt on 8/19. Called pt this am to schedule an appt - no answer; left message to call and schedule an appt in [facility].

## 2018-02-27 NOTE — Addendum Note (Signed)
Addended by: Gaylyn Rong on: 02/27/2018 11:16 AM   Modules accepted: Orders

## 2018-02-27 NOTE — Telephone Encounter (Signed)
Appt scheduled in [place] on 8/6.

## 2018-02-28 ENCOUNTER — Other Ambulatory Visit: Payer: Self-pay

## 2018-02-28 ENCOUNTER — Encounter (INDEPENDENT_AMBULATORY_CARE_PROVIDER_SITE_OTHER): Payer: Self-pay

## 2018-02-28 ENCOUNTER — Ambulatory Visit (INDEPENDENT_AMBULATORY_CARE_PROVIDER_SITE_OTHER): Payer: Medicare Other | Admitting: Internal Medicine

## 2018-02-28 ENCOUNTER — Encounter: Payer: Self-pay | Admitting: Internal Medicine

## 2018-02-28 VITALS — BP 144/54 | HR 62 | Temp 98.1°F | Ht 62.0 in | Wt 125.0 lb

## 2018-02-28 DIAGNOSIS — M1A9XX0 Chronic gout, unspecified, without tophus (tophi): Secondary | ICD-10-CM | POA: Diagnosis not present

## 2018-02-28 DIAGNOSIS — Z7984 Long term (current) use of oral hypoglycemic drugs: Secondary | ICD-10-CM

## 2018-02-28 DIAGNOSIS — R634 Abnormal weight loss: Secondary | ICD-10-CM

## 2018-02-28 DIAGNOSIS — Z79899 Other long term (current) drug therapy: Secondary | ICD-10-CM

## 2018-02-28 DIAGNOSIS — I10 Essential (primary) hypertension: Secondary | ICD-10-CM

## 2018-02-28 DIAGNOSIS — Z6822 Body mass index (BMI) 22.0-22.9, adult: Secondary | ICD-10-CM

## 2018-02-28 DIAGNOSIS — E559 Vitamin D deficiency, unspecified: Secondary | ICD-10-CM | POA: Diagnosis not present

## 2018-02-28 DIAGNOSIS — E1159 Type 2 diabetes mellitus with other circulatory complications: Secondary | ICD-10-CM

## 2018-02-28 DIAGNOSIS — N184 Chronic kidney disease, stage 4 (severe): Secondary | ICD-10-CM

## 2018-02-28 DIAGNOSIS — I129 Hypertensive chronic kidney disease with stage 1 through stage 4 chronic kidney disease, or unspecified chronic kidney disease: Secondary | ICD-10-CM

## 2018-02-28 DIAGNOSIS — E1122 Type 2 diabetes mellitus with diabetic chronic kidney disease: Secondary | ICD-10-CM | POA: Diagnosis not present

## 2018-02-28 DIAGNOSIS — K08109 Complete loss of teeth, unspecified cause, unspecified class: Secondary | ICD-10-CM

## 2018-02-28 LAB — POCT GLYCOSYLATED HEMOGLOBIN (HGB A1C): Hemoglobin A1C: 6.8 % — AB (ref 4.0–5.6)

## 2018-02-28 LAB — GLUCOSE, CAPILLARY: Glucose-Capillary: 184 mg/dL — ABNORMAL HIGH (ref 70–99)

## 2018-02-28 MED ORDER — BOOST BREEZE PO LIQD: 1.0000 | Freq: Two times a day (BID) | ORAL | 60 refills | Status: AC | PRN

## 2018-02-28 MED ORDER — HYDRALAZINE HCL 100 MG PO TABS: 100.0000 mg | ORAL_TABLET | Freq: Three times a day (TID) | ORAL | 1 refills | Status: DC

## 2018-02-28 MED ORDER — CLONIDINE 0.2 MG/24HR TD PTWK: 0.2000 mg | MEDICATED_PATCH | TRANSDERMAL | 1 refills | Status: DC

## 2018-02-28 NOTE — Progress Notes (Signed)
CC: Hypertension and diabetes follow-up  HPI:  [name] is a 84 y.o. female with hypertension, diabetes mellitus type 2, CKD stage IV, chronic gout, vitamin D insufficiency who presents for hypertension follow-up. Please see problem based charting for evaluation, assessment, and plan.   Past Medical History:  Diagnosis Date  . Anemia     normocytic anemia with baseline hemoglobin 10-11  . Blindness of left eye     likely related to stroke, left cataract removed from that eye with complications  . Calculus gallbladder and bile duct with cholecystitis with obstruction 01/2015  . Chronic kidney disease 2006    left renal artery stenosis with probable hemodynamic significance, kidneys are normal in morphology without focal lesions or hydronephrosis this is based but cannot A. of the abdomen with and without contrast done on the 31st 2006  . CKD (chronic kidney disease), stage III ([place])   . CVA (cerebral vascular accident) ([place]) 1990's  . CVA (cerebrovascular accident) [facility])  October 2007    CT of the head atrophy with multiple remote insults noted but no definite acute findings  . Glaucoma   . Gout   . Hyperlipidemia   . Hypertension   . Personal history of DVT (deep vein thrombosis) 08/11/2010   BLE  . Type II diabetes mellitus ([facility])    Review of Systems:   Occasional dizziness, blurry vision, decreased appetite Denies shortness of breath, chest pain, recent falls  Physical Exam:  Vitals:   02/28/18 1432  BP: (!) 144/54  Pulse: 62  Temp: 98.1 F (36.7 C)  TempSrc: Oral  SpO2: 98%  Weight: 125 lb (56.7 kg)  Height: 5\' 2"  (1.575 m)   Physical Exam  Constitutional: She appears well-developed and well-nourished. No distress.  HENT:  Head: Normocephalic and atraumatic.  Eyes: Conjunctivae are normal.  Cardiovascular: Normal rate, regular rhythm and normal heart sounds.  Respiratory: Effort normal and breath sounds normal. No respiratory distress. She has no  wheezes.  GI: Soft. Bowel sounds are normal. She exhibits no distension. There is no tenderness.  Musculoskeletal: She exhibits no edema.  Neurological: She is alert.  Skin: She is not diaphoretic.  Psychiatric: She has a normal mood and affect. Her behavior is normal. Judgment and thought content normal.    Assessment & Plan:   See Encounters Tab for problem based charting.   Diabetes mellitus type II The patient's last A1c was 7.4 in January 2019 and At today's visit her A1c was asked 0.8.  The patient is currently taking Januvia 25 mg daily.  She states that she has lost her glucose meter and is not able to collect her blood sugar readings at home.    Assessment and plan The patient's blood glucose level seems to have improved and therefore we will continue current regimen of Januvia 25 mg daily.  Hypertension The patient's blood pressure during this visit was 144/54.  Patient is currently being prescribed hydralazine 100 mg 3 times a day, Lasix 80 mg daily, clonidine 0.2 mg weekly.  The patient is accompanied by her daughter-in-law who states that the patient takes her hydralazine differently by taking 200 mg in the morning and 100 mg in the evening.   Assessment and plan The patient's blood pressure is relatively under control given her age and history of medication noncompliance.  I am concerned that the patient is taking her hydralazine medication differently than prescribed as she has been having recent episodes of blurry vision and dizziness that can  be very dangerous for her.  -Counseled the patient on the importance of taking hydralazine 3 times daily as instructed. -Placed home health order for the patient to get assistance with medications and monitoring of her blood pressure and blood glucose levels -Continue clonidine 0.2 mg weekly -Continue Lasix 80 mg daily  Weight loss The patient has lost 18 pounds in the past 7 months unintentionally.    Assessment and plan The  patient has been having decreased appetite and recently she lost her teeth which has been making it difficult for her to eat.  -Prescribed boost for calorie and protein support -We will monitor the patient's weight closely and consider further work-up if she continues to decrease weight -Prescribed home health services to help the patient out at home as she lives alone    Patient discussed with Dr. Dareen Piano

## 2018-02-28 NOTE — Assessment & Plan Note (Signed)
The patient has lost 18 pounds in the past 7 months unintentionally.    Assessment and plan The patient has been having decreased appetite and recently she lost her teeth which has been making it difficult for her to eat.  -Prescribed boost for calorie and protein support -We will monitor the patient's weight closely and consider further work-up if she continues to decrease weight -Prescribed home health services to help the patient out at home as she lives alone

## 2018-02-28 NOTE — Assessment & Plan Note (Signed)
The patient's blood pressure during this visit was 144/54.  Patient is currently being prescribed hydralazine 100 mg 3 times a day, Lasix 80 mg daily, clonidine 0.2 mg weekly.  The patient is accompanied by her daughter-in-law who states that the patient takes her hydralazine differently by taking 200 mg in the morning and 100 mg in the evening.   Assessment and plan The patient's blood pressure is relatively under control given her age and history of medication noncompliance.  I am concerned that the patient is taking her hydralazine medication differently than prescribed as she has been having recent episodes of blurry vision and dizziness that can be very dangerous for her.  -Counseled the patient on the importance of taking hydralazine 3 times daily as instructed. -Placed home health order for the patient to get assistance with medications and monitoring of her blood pressure and blood glucose levels -Continue clonidine 0.2 mg weekly -Continue Lasix 80 mg daily

## 2018-02-28 NOTE — Patient Instructions (Signed)
It was a pleasure to see you today Chelsea Mcdaniel. Please make the following changes:  -Please continue using clonidine 0.2mg  weekly -Please continue taking Hydralazine 100mg  three times a day in the morning, afternoon, and evening -Continue taking all of your other medications  If you have any questions or concerns, please call our clinic at [phone] between 9am-5pm and after hours call [phone] and ask for the internal medicine resident on call. If you feel you are having a medical emergency please call 911.   Thank you, we look forward to help you remain healthy!  [name], MD Internal Medicine PGY2

## 2018-02-28 NOTE — Assessment & Plan Note (Signed)
The patient's last A1c was 7.4 in January 2019 and At today's visit her A1c was asked 0.8.  The patient is currently taking Januvia 25 mg daily.  She states that she has lost her glucose meter and is not able to collect her blood sugar readings at home.    Assessment and plan The patient's blood glucose level seems to have improved and therefore we will continue current regimen of Januvia 25 mg daily.

## 2018-03-01 ENCOUNTER — Other Ambulatory Visit: Payer: Self-pay

## 2018-03-01 NOTE — Progress Notes (Signed)
Internal Medicine Clinic Attending  Case discussed with Dr. Chundi at the time of the visit.  We reviewed the resident's history and exam and pertinent patient test results.  I agree with the assessment, diagnosis, and plan of care documented in the resident's note. 

## 2018-03-01 NOTE — Telephone Encounter (Signed)
Pr Dr. Maricela Bo- Ms. Rabanal does not need to check her blood sugar at home. Called patient & left a message that she does not have to check her blood sugars. Left number for call back if she has questions

## 2018-03-01 NOTE — Telephone Encounter (Signed)
Return call made back to patient-spoke (pt's dtr in law).  They can not locate pt's meter and has requested new rx be sent to pharmacy.  Per pharmacy-pt's insurance will not cover a new meter as it is too soon.

## 2018-03-01 NOTE — Telephone Encounter (Signed)
Refilled 8/6 per Dr Maricela Bo.

## 2018-03-01 NOTE — Telephone Encounter (Signed)
Requesting to speak with a nurse about getting a meter.

## 2018-03-02 NOTE — Telephone Encounter (Signed)
Thank you :)

## 2018-03-08 ENCOUNTER — Other Ambulatory Visit: Payer: Self-pay | Admitting: Oncology

## 2018-03-08 ENCOUNTER — Other Ambulatory Visit: Payer: Self-pay | Admitting: *Deleted

## 2018-03-08 DIAGNOSIS — N183 Chronic kidney disease, stage 3 unspecified: Secondary | ICD-10-CM

## 2018-03-08 DIAGNOSIS — E1122 Type 2 diabetes mellitus with diabetic chronic kidney disease: Secondary | ICD-10-CM

## 2018-03-08 MED ORDER — ACCU-CHEK AVIVA PLUS W/DEVICE KIT: PACK | 0 refills | Status: AC

## 2018-03-08 NOTE — Telephone Encounter (Signed)
[facility] stated pt has lost her meter.

## 2018-03-13 ENCOUNTER — Ambulatory Visit (INDEPENDENT_AMBULATORY_CARE_PROVIDER_SITE_OTHER): Payer: Medicare Other | Admitting: Pharmacist

## 2018-03-13 ENCOUNTER — Ambulatory Visit: Payer: Self-pay

## 2018-03-13 DIAGNOSIS — Z86718 Personal history of other venous thrombosis and embolism: Secondary | ICD-10-CM | POA: Diagnosis not present

## 2018-03-13 DIAGNOSIS — Z5181 Encounter for therapeutic drug level monitoring: Secondary | ICD-10-CM

## 2018-03-13 DIAGNOSIS — E119 Type 2 diabetes mellitus without complications: Secondary | ICD-10-CM | POA: Diagnosis not present

## 2018-03-13 DIAGNOSIS — Z7901 Long term (current) use of anticoagulants: Secondary | ICD-10-CM

## 2018-03-13 DIAGNOSIS — I10 Essential (primary) hypertension: Secondary | ICD-10-CM | POA: Diagnosis not present

## 2018-03-13 DIAGNOSIS — N189 Chronic kidney disease, unspecified: Secondary | ICD-10-CM | POA: Diagnosis not present

## 2018-03-13 DIAGNOSIS — M109 Gout, unspecified: Secondary | ICD-10-CM | POA: Diagnosis not present

## 2018-03-13 LAB — POCT INR: INR: 2.8 (ref 2.0–3.0)

## 2018-03-13 NOTE — Progress Notes (Signed)
Anticoagulation Management Chelsea Mcdaniel is a 84 y.o. female who reports to the clinic for monitoring of warfarin treatment.    Indication: DVT, History of; long term current use of anticoagulant.   Duration: indefinite Supervising physician: [facility] Visit History: Patient does not report signs/symptoms of bleeding or thromboembolism  Other recent changes: No diet, medications, lifestyle changes endorsed by patient or her daughter-in-law who accompanies her and assists in medication administration and history.  Anticoagulation Episode Summary    Current INR goal:   2.0-3.0  TTR:   73.9 % (4.2 y)  Next INR check:   04/10/2018  INR from last check:   2.8 (03/13/2018)  Weekly max warfarin dose:     Target end date:     INR check location:     Preferred lab:     Send INR reminders to:      Indications   EMBOLISM/THROMBOSIS DEEP VSL LWR EXTRM NOS (Resolved) [I82.409] Long-term (current) use of anticoagulants [Z79.01]       Comments:           Allergies  Allergen Reactions  . Diltiazem Other (See Comments)    Symptomatic bradaycardia  . Metoprolol Other (See Comments)    Symptomatic bradycardia   Prior to Admission medications   Medication Sig Start Date End Date Taking? Authorizing Provider  ACCU-CHEK SOFTCLIX LANCETS lancets Used to check blood sugar 3 times daily. Dx code E11.8 06/08/17  Yes Chundi, [name], MD  allopurinol (ZYLOPRIM) 100 MG tablet TAKE ONE AND A HALF TABLETS BY MOUTH EVERY DAY 10/31/17  Yes Chundi, [name], MD  Blood Glucose Monitoring Suppl (ACCU-CHEK AVIVA PLUS) w/Device KIT Check blood sugar 1 time a day 03/08/18  Yes Chundi, [name], MD  Calcium Carbonate-Vitamin D 600-400 MG-UNIT per tablet Take 1 tablet by mouth 2 (two) times daily.   Yes [provider]  cloNIDine (CATAPRES - DOSED IN MG/24 HR) 0.2 mg/24hr patch Place 1 patch (0.2 mg total) onto the skin once a week. 02/28/18  Yes Chundi, [name], MD   dorzolamide-timolol (COSOPT) 22.3-6.8 MG/ML ophthalmic solution Place 1 drop into both eyes 2 (two) times daily. 01/21/16  Yes [provider]  furosemide (LASIX) 10 MG/ML solution Take 8 mLs (80 mg total) by mouth daily. 09/06/17  Yes Chundi, Verne Spurr, MD  GAVILAX powder Take 17 grams by mouth 2 times daily 03/08/18  Yes Chundi, [name], MD  glucose blood (ACCU-CHEK AVIVA PLUS) test strip USE 1 TIME DAILY TO CHECK BLOOD SUGAR 09/03/17  Yes Chundi, [name], MD  glucose blood (ACCU-CHEK AVIVA) test strip Use as instructed 05/03/11 09/03/18 Yes Sharda, Neema K, MD  GNP STOOL SOFTENER/LAXATIVE 8.6-50 MG tablet TAKE 2 TABLETS BY MOUTH AT BEDTIME AS NEEDED FOR CONSTIPATION 07/16/16  Yes Sid Falcon, MD  hydrALAZINE (APRESOLINE) 100 MG tablet Take 1 tablet (100 mg total) by mouth 3 (three) times daily. 02/28/18 05/29/18 Yes Chundi, [name], MD  JANUVIA 25 MG tablet TAKE 1 TABLET BY MOUTH EVERY DAY 10/04/17  Yes Chundi, [name], MD  Lancets Misc. (ACCU-CHEK SOFTCLIX LANCET DEV) KIT Use to check blood sugar one time a day 09/03/17  Yes Chundi, [name], MD  LUMIGAN 0.01 % SOLN Place 1 drop into both eyes every morning.  12/05/13  Yes [provider]  Nutritional Supplements (FEEDING SUPPLEMENT, BOOST BREEZE,) LIQD Take 1 Bottle by mouth 2 (two) times daily as needed. 02/28/18 03/30/18 Yes Chundi, [name], MD  polyethylene glycol (MIRALAX) packet Take 17 g by mouth 2 (two) times  daily. 03/25/17  Yes [name], MD  warfarin (COUMADIN) 5 MG tablet TAKE 1 TABLET BY MOUTH EVERY DAY AT 6 pm EXCEPT TAKE 1/2 TABLET ON monday AND thursday 01/23/18  Yes Chundi, [name], MD  warfarin (COUMADIN) 5 MG tablet TAKE 1 TABLET BY MOUTH EVERY DAY AT 6 PM EXCEPT [facility] AND FRIDAY TAKE 1 & 1/2 TABLETS ON THOSE DAYS 01/23/18  Yes [name], MD   Past Medical History:  Diagnosis Date  . Anemia     normocytic anemia with baseline hemoglobin 10-11  . Blindness of left eye     likely related to stroke, left cataract  removed from that eye with complications  . Calculus gallbladder and bile duct with cholecystitis with obstruction 01/2015  . Chronic kidney disease 2006    left renal artery stenosis with probable hemodynamic significance, kidneys are normal in morphology without focal lesions or hydronephrosis this is based but cannot A. of the abdomen with and without contrast done on the 31st 2006  . CKD (chronic kidney disease), stage III ([place])   . CVA (cerebral vascular accident) ([place]) 1990's  . CVA (cerebrovascular accident) [facility])  October 2007    CT of the head atrophy with multiple remote insults noted but no definite acute findings  . Glaucoma   . Gout   . Hyperlipidemia   . Hypertension   . Personal history of DVT (deep vein thrombosis) 08/11/2010   BLE  . Type II diabetes mellitus ([place])    Social History   Socioeconomic History  . Marital status: Widowed    Spouse name: Not on file  . Number of children: Not on file  . Years of education: Not on file  . Highest education level: Not on file  Occupational History  . Not on file  Social Needs  . Financial resource strain: Not on file  . Food insecurity:    Worry: Not on file    Inability: Not on file  . Transportation needs:    Medical: Not on file    Non-medical: Not on file  Tobacco Use  . Smoking status: Never Smoker  . Smokeless tobacco: Never Used  Substance and Sexual Activity  . Alcohol use: No    Alcohol/week: 0.0 standard drinks  . Drug use: No  . Sexual activity: Never  Lifestyle  . Physical activity:    Days per week: Not on file    Minutes per session: Not on file  . Stress: Not on file  Relationships  . Social connections:    Talks on phone: Not on file    Gets together: Not on file    Attends religious service: Not on file    Active member of club or organization: Not on file    Attends meetings of clubs or organizations: Not on file    Relationship status: Not on file  Other Topics Concern  . Not on file   Social History Narrative    Patient is a widow. She has 11 children 5 of who are living. She is retired in 1993 from [employer]. She denies tobacco alcohol or drug use.   Family History  Problem Relation Age of Onset  . Heart disease Mother   . Diabetes Mother   . Hyperlipidemia Mother   . Hypertension Mother   . Heart disease Father   . Diabetes Father   . Hyperlipidemia Father   . Hypertension Father     ASSESSMENT Recent Results: The most recent result is correlated  with 32.5 mg per week: Lab Results  Component Value Date   INR 2.8 03/13/2018   INR 2.1 02/13/2018   INR 2.4 01/16/2018    Anticoagulation Dosing: Description   Take 1 tablet by mouth once-daily at 6PM all days of week, EXCEPT on MONDAYS and WEDNESDAYS. Take only 1/2 tablet on MONDAYS and WEDNESDAYS.       INR today: Therapeutic  PLAN Weekly dose was decreased by 7% to 30 mg per week  Patient Instructions  Patient instructed to take medications as defined in the Anti-coagulation Track section of this encounter.  Patient instructed to take today's dose.  Patient instructed to take 1 tablet by mouth once-daily at 6PM all days of week, EXCEPT on MONDAYS and WEDNESDAYS. Take only 1/2 tablet on MONDAYS and WEDNESDAYS. Patient verbalized understanding of these instructions.     Patient advised to contact clinic or seek medical attention if signs/symptoms of bleeding or thromboembolism occur.  Patient verbalized understanding by repeating back information and was advised to contact me if further medication-related questions arise. Patient was also provided an information handout.  Follow-up Return in about 4 weeks (around 04/10/2018) for Follow up INR at 3:15PM.  Pennie Banter, PharmD, CACP, CPP  15 minutes spent face-to-face with the patient during the encounter. 50% of time spent on education. 50% of time was spent on fingerstick point of care INR sample collection, processing, results determination,  dose adjustment and documentation in [email or web address].

## 2018-03-13 NOTE — Patient Instructions (Signed)
Patient instructed to take medications as defined in the Anti-coagulation Track section of this encounter.  Patient instructed to take today's dose.  Patient instructed to take 1 tablet by mouth once-daily at 6PM all days of week, EXCEPT on MONDAYS and WEDNESDAYS. Take only 1/2 tablet on MONDAYS and WEDNESDAYS. Patient verbalized understanding of these instructions.

## 2018-03-16 ENCOUNTER — Other Ambulatory Visit: Payer: Self-pay | Admitting: Internal Medicine

## 2018-03-22 DIAGNOSIS — E119 Type 2 diabetes mellitus without complications: Secondary | ICD-10-CM | POA: Diagnosis not present

## 2018-03-22 DIAGNOSIS — I129 Hypertensive chronic kidney disease with stage 1 through stage 4 chronic kidney disease, or unspecified chronic kidney disease: Secondary | ICD-10-CM | POA: Diagnosis not present

## 2018-03-22 DIAGNOSIS — R600 Localized edema: Secondary | ICD-10-CM | POA: Diagnosis not present

## 2018-03-22 DIAGNOSIS — I10 Essential (primary) hypertension: Secondary | ICD-10-CM | POA: Diagnosis not present

## 2018-03-22 DIAGNOSIS — M109 Gout, unspecified: Secondary | ICD-10-CM | POA: Diagnosis not present

## 2018-03-22 DIAGNOSIS — R809 Proteinuria, unspecified: Secondary | ICD-10-CM | POA: Diagnosis not present

## 2018-03-22 DIAGNOSIS — N184 Chronic kidney disease, stage 4 (severe): Secondary | ICD-10-CM | POA: Diagnosis not present

## 2018-03-22 DIAGNOSIS — N189 Chronic kidney disease, unspecified: Secondary | ICD-10-CM | POA: Diagnosis not present

## 2018-03-22 DIAGNOSIS — N2581 Secondary hyperparathyroidism of renal origin: Secondary | ICD-10-CM | POA: Diagnosis not present

## 2018-03-29 DIAGNOSIS — N189 Chronic kidney disease, unspecified: Secondary | ICD-10-CM | POA: Diagnosis not present

## 2018-03-29 DIAGNOSIS — I10 Essential (primary) hypertension: Secondary | ICD-10-CM | POA: Diagnosis not present

## 2018-03-29 DIAGNOSIS — M109 Gout, unspecified: Secondary | ICD-10-CM | POA: Diagnosis not present

## 2018-03-29 DIAGNOSIS — E119 Type 2 diabetes mellitus without complications: Secondary | ICD-10-CM | POA: Diagnosis not present

## 2018-03-30 ENCOUNTER — Encounter: Payer: Self-pay | Admitting: Internal Medicine

## 2018-04-03 ENCOUNTER — Encounter: Payer: Self-pay | Admitting: Internal Medicine

## 2018-04-03 ENCOUNTER — Ambulatory Visit (INDEPENDENT_AMBULATORY_CARE_PROVIDER_SITE_OTHER): Payer: Medicare Other | Admitting: Internal Medicine

## 2018-04-03 VITALS — BP 116/64 | HR 68 | Ht 63.0 in | Wt 118.4 lb

## 2018-04-03 DIAGNOSIS — K5900 Constipation, unspecified: Secondary | ICD-10-CM | POA: Diagnosis not present

## 2018-04-03 DIAGNOSIS — K224 Dyskinesia of esophagus: Secondary | ICD-10-CM

## 2018-04-03 DIAGNOSIS — R1312 Dysphagia, oropharyngeal phase: Secondary | ICD-10-CM | POA: Diagnosis not present

## 2018-04-03 NOTE — Progress Notes (Signed)
Patient ID: Chelsea Mcdaniel, female   DOB: September 17, 1927, 84 y.o.   MRN: [id number] HPI: Chelsea Mcdaniel is an 84 year old female with a past medical history of chronic kidney disease, chronic anemia, history of CVA, hypertension, hyperlipidemia and diabetes who is on chronic anticoagulation with warfarin who is seen in consultation at the request of Dr. Florene Glen to evaluate issues with swallowing and also weight loss.  She is here today with her daughter-in-law.  On review of records from Dr. Florene Glen it is noted that the patient has had issues with swallowing and had prior barium esophagram.  Per the referring notes she has had issues swallowing pills and is also lost weight over the last 1 to 2 years.  The patient states that she has had issues swallowing pills but that she is managing by using soft banana during pill administration.  She is also drinking plenty of fluids during pill administration.  Patient reports a good appetite and she denies issues with solid food dysphagia.  She states she is eating primarily one meal in the morning and snacking throughout the rest of the day and evening.  She denies abdominal pain.  She denies heartburn.  She denies odynophagia.  No abdominal pain.  She does have issues with constipation and has been MiraLAX prescribed.  She feels that some days this is not given to her but on the days when she takes it it seems to be beneficial to her.  She will occasionally use Ex-Lax.  She denies seeing blood in her stool and melena.  She had a barium esophagram which was performed on 03/01/2016 which I have reviewed.  This was performed for difficulty swallowing pills and dysphagia.  This showed difficulty with the oral stage of barium tablet administration.  There was a mild weblike narrowing in the cervical esophagus.  There were 3 anterior indentations in the proximal thoracic esophagus likely representing muscle spasm.  There was also impaired secondary peristalsis with mild intermittent  reverse peristalsis.  There is no hiatal hernia or reflux seen.  She also had a colonoscopy which was performed on 12/27/2005 by Dr. Deatra Ina.  This was normal.    Past Medical History:  Diagnosis Date  . Anemia     normocytic anemia with baseline hemoglobin 10-11  . Blindness of left eye     likely related to stroke, left cataract removed from that eye with complications  . Calculus gallbladder and bile duct with cholecystitis with obstruction 01/2015  . Chronic kidney disease 2006    left renal artery stenosis with probable hemodynamic significance, kidneys are normal in morphology without focal lesions or hydronephrosis this is based but cannot A. of the abdomen with and without contrast done on the 31st 2006  . CKD (chronic kidney disease), stage III ([place])   . CVA (cerebral vascular accident) ([place]) 1990's  . CVA (cerebrovascular accident) [facility])  October 2007    CT of the head atrophy with multiple remote insults noted but no definite acute findings  . Glaucoma   . Gout   . Hyperlipidemia   . Hypertension   . Personal history of DVT (deep vein thrombosis) 08/11/2010   BLE  . Type II diabetes mellitus ([place])     Past Surgical History:  Procedure Laterality Date  . ABDOMINAL HYSTERECTOMY    . CATARACT EXTRACTION Left   . TRANSTHORACIC ECHOCARDIOGRAM   May 2008    left ventricular systolic function normal EF estimated range of 55-60%, no definite diagnostic evidence of left  ventricular regional wall motion abnormalities, Doppler parameters consistent with abnormal left ventricular relaxation, findings suggestive of possible bicuspid aortic valve and aortic valve thickness mildly increase    Outpatient Medications Prior to Visit  Medication Sig Dispense Refill  . ACCU-CHEK SOFTCLIX LANCETS lancets Used to check blood sugar 3 times daily. Dx code E11.8 100 each 6  . allopurinol (ZYLOPRIM) 100 MG tablet TAKE ONE AND A HALF TABLETS BY MOUTH EVERY DAY 90 tablet 3  . Blood Glucose  Monitoring Suppl (ACCU-CHEK AVIVA PLUS) w/Device KIT Check blood sugar 1 time a day 1 kit 0  . cloNIDine (CATAPRES - DOSED IN MG/24 HR) 0.2 mg/24hr patch Place 1 patch (0.2 mg total) onto the skin once a week. 12 patch 1  . colchicine 0.6 MG tablet Take 0.6 mg by mouth as needed.    . furosemide (LASIX) 10 MG/ML solution Take 8 mLs (80 mg total) by mouth daily. 60 mL 1  . glucose blood (ACCU-CHEK AVIVA PLUS) test strip USE 1 TIME DAILY TO CHECK BLOOD SUGAR 100 each 3  . glucose blood (ACCU-CHEK AVIVA) test strip Use as instructed 100 each 12  . hydrALAZINE (APRESOLINE) 100 MG tablet Take 1 tablet (100 mg total) by mouth 3 (three) times daily. 270 tablet 1  . JANUVIA 25 MG tablet TAKE 1 TABLET BY MOUTH EVERY DAY 90 tablet 2  . Lancets Misc. (ACCU-CHEK SOFTCLIX LANCET DEV) KIT Use to check blood sugar one time a day 1 kit 2  . LUMIGAN 0.01 % SOLN Place 1 drop into both eyes every morning.     . polyethylene glycol (MIRALAX) packet Take 17 g by mouth 2 (two) times daily. 180 packet 1  . warfarin (COUMADIN) 5 MG tablet TAKE 1 TABLET BY MOUTH EVERY DAY AT 6 pm EXCEPT TAKE 1/2 TABLET ON monday AND thursday 32 tablet 1  . Calcium Carbonate-Vitamin D 600-400 MG-UNIT per tablet Take 1 tablet by mouth 2 (two) times daily.    . dorzolamide-timolol (COSOPT) 22.3-6.8 MG/ML ophthalmic solution Place 1 drop into both eyes 2 (two) times daily.    Marland Kitchen GAVILAX powder Take 17 grams by mouth 2 times daily 850 g 2  . GNP STOOL SOFTENER/LAXATIVE 8.6-50 MG tablet TAKE 2 TABLETS BY MOUTH AT BEDTIME AS NEEDED FOR CONSTIPATION 90 tablet 3  . warfarin (COUMADIN) 5 MG tablet TAKE 1 TABLET BY MOUTH EVERY DAY AT 6 PM EXCEPT WEDNESDAY AND FRIDAY TAKE 1 & 1/2 TABLETS ON THOSE DAYS 32 tablet 1   No facility-administered medications prior to visit.     Allergies  Allergen Reactions  . Diltiazem Other (See Comments)    Symptomatic bradaycardia  . Metoprolol Other (See Comments)    Symptomatic bradycardia    Family History   Problem Relation Age of Onset  . Heart disease Mother   . Diabetes Mother   . Hyperlipidemia Mother   . Hypertension Mother   . Heart disease Father   . Diabetes Father   . Hyperlipidemia Father   . Hypertension Father     Social History   Tobacco Use  . Smoking status: Never Smoker  . Smokeless tobacco: Never Used  Substance Use Topics  . Alcohol use: No    Alcohol/week: 0.0 standard drinks  . Drug use: No    ROS: As per history of present illness, otherwise negative  BP 116/64   Pulse 68   Ht 5' 3" (1.6 m)   Wt 118 lb 6 oz (53.7 kg)   BMI 20.97  kg/m  Constitutional: Elderly appearing female in no acute distress  HEENT: Normocephalic and atraumatic. Oropharynx is clear and moist. Conjunctivae are normal.  No scleral icterus. Neck: Neck supple. Trachea midline. Cardiovascular: Normal rate, regular rhythm and intact distal pulses.  2/6 systolic ejection murmur Pulmonary/chest: Effort normal and breath sounds normal. No wheezing, rales or rhonchi. Abdominal: Soft, nontender, nondistended. Bowel sounds active throughout. Neurological: Alert and oriented to person place and time. Skin: Skin is warm and dry.  Psychiatric: Normal mood and affect. Behavior is normal.  RELEVANT  IMAGING: ESOPHOGRAM/BARIUM SWALLOW   TECHNIQUE: Combined double contrast and single contrast examination performed using effervescent crystals, thick barium liquid, and thin barium liquid.   FLUOROSCOPY TIME:  Radiation Exposure Index (as provided by the fluoroscopic device): 46 Gy cm2   If the device does not provide the exposure index:   Fluoroscopy Time:  2 minutes 12 seconds   Number of Acquired Images:  17   COMPARISON:  None.   FINDINGS: The patient swallowed barium without difficulty. Normal primary esophageal peristalsis and mild intermittent impairment of the secondary peristalsis with mild intermittent reversal of the normal peristalsis in the esophagus. No hypopharyngeal  abnormalities were seen.   There is mild web-like narrowing of the proximal cervical esophagus. There were also 3 small, rounded, anterior indentations in the proximal thoracic esophagus. No significant luminal narrowing in the thoracic esophagus.   The remainder the esophagus has a normal appearance with no strictures, masses or ulcerations. No hiatal hernia or gastroesophageal reflux were seen during the examination.   The patient tried to swallow a barium tablet. However, she could not move the tablet from the front of the mouth to the back of the mouth.   IMPRESSION: 1. Oral stage difficulty with a barium tablet, resulting in inability of the patient to swallow the tablet. 2. Mild web-like narrowing of the proximal cervical esophagus. 3. 3 small, rounded anterior indentations in the proximal thoracic esophagus. These could represent small polyps or areas of muscle spasm. 4. Mild, intermittently impaired secondary esophageal peristalsis with mild intermittent reversed peristalsis. 5. No hiatal hernia or gastroesophageal reflux seen.     Electronically Signed   By: Claudie Revering M.D.   On: 03/01/2016 14:09   ASSESSMENT/PLAN: 84 year old female with a past medical history of chronic kidney disease, chronic anemia, history of CVA, hypertension, hyperlipidemia and diabetes who is on chronic anticoagulation with warfarin who is seen in consultation at the request of Dr. Florene Glen to evaluate issues with swallowing and also weight loss.  1.  Oropharyngeal and esophageal dysphagia --her barium esophagram is very telling and consistent with esophageal dysmotility.  This likely contributes to her issues swallowing pills.  She is not having progressive solid food dysphagia per patient and this is corroborated by her daughter-in-law.  We discussed upper endoscopy but at this point I do not feel that it would benefit as there would be no improvement in esophageal dysmotility.  The secondary  and tertiary contractions seen on this study are consistent with dysmotility.  Also she likely has a component of oropharyngeal dysphagia and issues with bolus transfer.  We discussed possible speech and swallow therapy but her family states this may be difficult for her to adhere to these appointments.  I am doubtful that esophageal dilation would help her swallowing.  After this discussion we will not pursue upper endoscopy at present.  She has been advised to continue to crush any pills when possible and take with applesauce or very soft  solids.  I advised that she drink liquids in between bites of solid food.  Remain upright during and 60 to 90 minutes after eating if possible.  We discussed how chin tuck can also help aid in swallowing.  2.  Chronic constipation --continue MiraLAX 17 g once to twice daily as needed for constipation  She can follow-up as needed   UX:[facility], [place], [place], [place] [address]

## 2018-04-03 NOTE — Patient Instructions (Signed)
Please purchase the following medications over the counter and take as directed: Miralax 17 grams (1 capful) dissolved in at least 8 ounces water/juice daily.  Please follow up with Dr Hilarie Fredrickson as needed.  If you are age 82 or older, your body mass index should be between 23-30. Your Body mass index is 20.97 kg/m. If this is out of the aforementioned range listed, please consider follow up with your Primary Care Provider.  If you are age 58 or younger, your body mass index should be between 19-25. Your Body mass index is 20.97 kg/m. If this is out of the aformentioned range listed, please consider follow up with your Primary Care Provider.

## 2018-04-05 DIAGNOSIS — M109 Gout, unspecified: Secondary | ICD-10-CM | POA: Diagnosis not present

## 2018-04-05 DIAGNOSIS — E119 Type 2 diabetes mellitus without complications: Secondary | ICD-10-CM | POA: Diagnosis not present

## 2018-04-05 DIAGNOSIS — I10 Essential (primary) hypertension: Secondary | ICD-10-CM | POA: Diagnosis not present

## 2018-04-05 DIAGNOSIS — N189 Chronic kidney disease, unspecified: Secondary | ICD-10-CM | POA: Diagnosis not present

## 2018-04-10 ENCOUNTER — Ambulatory Visit: Payer: Self-pay

## 2018-04-12 DIAGNOSIS — E119 Type 2 diabetes mellitus without complications: Secondary | ICD-10-CM | POA: Diagnosis not present

## 2018-04-12 DIAGNOSIS — I10 Essential (primary) hypertension: Secondary | ICD-10-CM | POA: Diagnosis not present

## 2018-04-12 DIAGNOSIS — M109 Gout, unspecified: Secondary | ICD-10-CM | POA: Diagnosis not present

## 2018-04-12 DIAGNOSIS — N189 Chronic kidney disease, unspecified: Secondary | ICD-10-CM | POA: Diagnosis not present

## 2018-04-19 DIAGNOSIS — M109 Gout, unspecified: Secondary | ICD-10-CM | POA: Diagnosis not present

## 2018-04-19 DIAGNOSIS — E119 Type 2 diabetes mellitus without complications: Secondary | ICD-10-CM | POA: Diagnosis not present

## 2018-04-19 DIAGNOSIS — I10 Essential (primary) hypertension: Secondary | ICD-10-CM | POA: Diagnosis not present

## 2018-04-19 DIAGNOSIS — N189 Chronic kidney disease, unspecified: Secondary | ICD-10-CM | POA: Diagnosis not present

## 2018-04-21 ENCOUNTER — Ambulatory Visit (INDEPENDENT_AMBULATORY_CARE_PROVIDER_SITE_OTHER): Payer: Medicare Other | Admitting: Internal Medicine

## 2018-04-21 ENCOUNTER — Encounter: Payer: Self-pay | Admitting: Internal Medicine

## 2018-04-21 VITALS — BP 135/61 | HR 69 | Temp 97.8°F | Ht 63.0 in | Wt 123.7 lb

## 2018-04-21 DIAGNOSIS — I129 Hypertensive chronic kidney disease with stage 1 through stage 4 chronic kidney disease, or unspecified chronic kidney disease: Secondary | ICD-10-CM | POA: Diagnosis not present

## 2018-04-21 DIAGNOSIS — R600 Localized edema: Secondary | ICD-10-CM

## 2018-04-21 DIAGNOSIS — Z79899 Other long term (current) drug therapy: Secondary | ICD-10-CM

## 2018-04-21 DIAGNOSIS — Z0000 Encounter for general adult medical examination without abnormal findings: Secondary | ICD-10-CM

## 2018-04-21 DIAGNOSIS — E1122 Type 2 diabetes mellitus with diabetic chronic kidney disease: Secondary | ICD-10-CM

## 2018-04-21 DIAGNOSIS — D631 Anemia in chronic kidney disease: Secondary | ICD-10-CM | POA: Diagnosis not present

## 2018-04-21 DIAGNOSIS — I10 Essential (primary) hypertension: Principal | ICD-10-CM

## 2018-04-21 DIAGNOSIS — D638 Anemia in other chronic diseases classified elsewhere: Secondary | ICD-10-CM

## 2018-04-21 DIAGNOSIS — M25472 Effusion, left ankle: Secondary | ICD-10-CM

## 2018-04-21 DIAGNOSIS — M25471 Effusion, right ankle: Secondary | ICD-10-CM

## 2018-04-21 DIAGNOSIS — N184 Chronic kidney disease, stage 4 (severe): Secondary | ICD-10-CM

## 2018-04-21 DIAGNOSIS — E1159 Type 2 diabetes mellitus with other circulatory complications: Secondary | ICD-10-CM

## 2018-04-21 NOTE — Patient Instructions (Signed)
It was a pleasure to see you today Chelsea Mcdaniel. You are doing well. I am not making any changes to your current medication. Please follow up in 4 months. Thank you!  If you have any questions or concerns, please call our clinic at [phone] between 9am-5pm and after hours call [phone] and ask for the internal medicine resident on call. If you feel you are having a medical emergency please call 911.   Thank you, we look forward to help you remain healthy!  [name], MD Internal Medicine PGY2

## 2018-04-21 NOTE — Assessment & Plan Note (Signed)
The patient's last a1c=6.8 in August 2019. She is currently taking [place] 25mg  qd. The patient's home blood glucose measurements are being monitored by her home health nurse and they have been reported to be in normal range. The patient does not note episodes of hypoglycemia. Patient's weight changed from 118 to 123lbs over the past 19 days.  Assessment and plan  Continue januvia 25mg  qd.

## 2018-04-21 NOTE — Progress Notes (Signed)
   CC: Diabetes Mellitus Follow up  HPI:  [name] is a 84 y.o. female with hypertension, type 2 diabetes mellitus, CKD 4, and anemia of ckd 4 who presents for diabetes mellitus follow up. Please see problem based charting for evaluation, assessment, and plan.  Past Medical History:  Diagnosis Date  . Anemia     normocytic anemia with baseline hemoglobin 10-11  . Blindness of left eye     likely related to stroke, left cataract removed from that eye with complications  . Calculus gallbladder and bile duct with cholecystitis with obstruction 01/2015  . Chronic kidney disease 2006    left renal artery stenosis with probable hemodynamic significance, kidneys are normal in morphology without focal lesions or hydronephrosis this is based but cannot A. of the abdomen with and without contrast done on the 31st 2006  . CKD (chronic kidney disease), stage III ([place])   . CVA (cerebral vascular accident) ([place]) 1990's  . CVA (cerebrovascular accident) [facility])  October 2007    CT of the head atrophy with multiple remote insults noted but no definite acute findings  . Glaucoma   . Gout   . Hyperlipidemia   . Hypertension   . Personal history of DVT (deep vein thrombosis) 08/11/2010   BLE  . Type II diabetes mellitus ([facility])    Review of Systems:    Denies sob, chest pain, dizziness headaches  Physical Exam:  Vitals:   04/21/18 1535  BP: 135/61  Pulse: 69  Temp: 97.8 F (36.6 C)  TempSrc: Oral  SpO2: 100%  Weight: 123 lb 11.2 oz (56.1 kg)  Height: 5\' 3"  (1.6 m)   Physical Exam  Constitutional: She appears well-developed and well-nourished. No distress.  HENT:  Head: Normocephalic and atraumatic.  Eyes: Conjunctivae are normal.  Cardiovascular: Normal rate, regular rhythm and normal heart sounds.  Respiratory: Effort normal and breath sounds normal. No respiratory distress. She has no wheezes.  GI: Soft. Bowel sounds are normal. There is no tenderness.  Musculoskeletal: She  exhibits edema (around bilateral ankles. Right ankle has pitting ).  Neurological: She is alert.  Skin: She is not diaphoretic.  Psychiatric: She has a normal mood and affect. Her behavior is normal. Judgment and thought content normal.     Assessment & Plan:   See Encounters Tab for problem based charting.  Patient discussed with Dr. Dareen Piano

## 2018-04-22 NOTE — Assessment & Plan Note (Signed)
Patient refused influenza vaccination

## 2018-04-22 NOTE — Assessment & Plan Note (Signed)
The patient is noted to have bilateral ankle edema with 1+ pitting on the right ankle. The ankle is not warm to touch, erythematous or tender. The patient continues to have good range of motion. She feels that the edema may reduce after she elevated her feet.   The patient uses a cane for assistance with walking secondary to her prior strokes. She does try to move around her house daily.   Assessment and plan  Patient has low wells score (without active cancer, not bedbound, no calf edema). Joints were not erythematous, warm to touch, or tender making inflammatory process less likely. Possible ckd contributing to the edema.   However, edema thought to be more likely dependent edema. Recommended ted hose and elevation of bilateral feet.

## 2018-04-22 NOTE — Assessment & Plan Note (Signed)
The patient's blood pressure during this visit was 135/61. The patient is currently taking hydralazine 100mg  tid, clonidine 0.2mg  weekly, and lasix 80mg  qd. His last blood pressure visits are  BP Readings from Last 3 Encounters:  04/21/18 135/61  04/03/18 116/64  02/28/18 (!) 144/54  The patient does not report palpitations, dizziness, chest pain, sob.   Assessment and plan  The patient's blood pressure is under control, will continue current medication regimen of hydralazine 100mg  td, clonidine 0.2mg  weekly, and lasix 80mg  qd.

## 2018-04-22 NOTE — Assessment & Plan Note (Signed)
The patient is being following by Dr. Florene Glen at [facility]. Her last documented creatinine is 2.33 in May 2019. Phosphorous and calcium are within normal limits.   Recommend continuing following up with [place] kidney disease

## 2018-04-24 NOTE — Progress Notes (Signed)
Internal Medicine Clinic Attending  Case discussed with Dr. Chundi at the time of the visit.  We reviewed the resident's history and exam and pertinent patient test results.  I agree with the assessment, diagnosis, and plan of care documented in the resident's note. 

## 2018-04-29 ENCOUNTER — Other Ambulatory Visit: Payer: Self-pay

## 2018-04-29 ENCOUNTER — Emergency Department (HOSPITAL_COMMUNITY)
Admission: EM | Admit: 2018-04-29 | Discharge: 2018-04-29 | Disposition: A | Discharge status: Home / Self Care | Payer: Medicare Other | Attending: Emergency Medicine | Admitting: Emergency Medicine

## 2018-04-29 ENCOUNTER — Encounter (HOSPITAL_COMMUNITY): Payer: Self-pay | Admitting: Emergency Medicine

## 2018-04-29 DIAGNOSIS — Z5321 Procedure and treatment not carried out due to patient leaving prior to being seen by health care provider: Secondary | ICD-10-CM | POA: Insufficient documentation

## 2018-04-29 DIAGNOSIS — K5900 Constipation, unspecified: Secondary | ICD-10-CM | POA: Diagnosis not present

## 2018-04-29 DIAGNOSIS — K6289 Other specified diseases of anus and rectum: Secondary | ICD-10-CM | POA: Insufficient documentation

## 2018-04-29 NOTE — ED Triage Notes (Signed)
C/o constipation- unsure when last BM was.  Reports rectal pain.  Pt very uncomfortable/pacing.

## 2018-04-29 NOTE — ED Notes (Signed)
Pt's husband came to nurse first and stated that his wife had a bowel movement and was now no longer experiencing his chief complaint of constipation. Mortimer Fries, RN in triage notified. Pt talked with Mortimer Fries, RN. Pt noted to be leaving waiting room with husband.

## 2018-05-02 ENCOUNTER — Other Ambulatory Visit: Payer: Self-pay

## 2018-05-02 ENCOUNTER — Encounter: Payer: Self-pay | Admitting: Internal Medicine

## 2018-05-02 ENCOUNTER — Ambulatory Visit (INDEPENDENT_AMBULATORY_CARE_PROVIDER_SITE_OTHER): Payer: Medicare Other | Admitting: Internal Medicine

## 2018-05-02 VITALS — BP 196/67 | HR 62 | Temp 98.0°F | Ht 63.0 in | Wt 134.0 lb

## 2018-05-02 DIAGNOSIS — I10 Essential (primary) hypertension: Secondary | ICD-10-CM

## 2018-05-02 DIAGNOSIS — R5383 Other fatigue: Secondary | ICD-10-CM | POA: Diagnosis not present

## 2018-05-02 DIAGNOSIS — R5381 Other malaise: Secondary | ICD-10-CM | POA: Diagnosis not present

## 2018-05-02 DIAGNOSIS — D649 Anemia, unspecified: Secondary | ICD-10-CM | POA: Diagnosis not present

## 2018-05-02 DIAGNOSIS — Z79899 Other long term (current) drug therapy: Secondary | ICD-10-CM

## 2018-05-02 DIAGNOSIS — Z9114 Patient's other noncompliance with medication regimen: Secondary | ICD-10-CM | POA: Diagnosis not present

## 2018-05-02 LAB — POCT URINALYSIS DIPSTICK
BILIRUBIN UA: NEGATIVE
GLUCOSE UA: POSITIVE — AB
Ketones, UA: NEGATIVE
LEUKOCYTES UA: NEGATIVE
NITRITE UA: NEGATIVE
Protein, UA: POSITIVE — AB
RBC UA: NEGATIVE
Spec Grav, UA: 1.015 (ref 1.010–1.025)
Urobilinogen, UA: 1 E.U./dL
pH, UA: 7 (ref 5.0–8.0)

## 2018-05-02 LAB — BASIC METABOLIC PANEL
ANION GAP: 11 (ref 5–15)
BUN: 30 mg/dL — ABNORMAL HIGH (ref 8–23)
CHLORIDE: 98 mmol/L (ref 98–111)
CO2: 28 mmol/L (ref 22–32)
Calcium: 9.2 mg/dL (ref 8.9–10.3)
Creatinine, Ser: 2.06 mg/dL — ABNORMAL HIGH (ref 0.44–1.00)
GFR calc non Af Amer: 20 mL/min — ABNORMAL LOW (ref >60)
GFR, EST AFRICAN AMERICAN: 24 mL/min — AB (ref >60)
Glucose, Bld: 184 mg/dL — ABNORMAL HIGH (ref 70–99)
Potassium: 3.4 mmol/L — ABNORMAL LOW (ref 3.5–5.1)
Sodium: 137 mmol/L (ref 135–145)

## 2018-05-02 LAB — CBC WITH DIFFERENTIAL/PLATELET
ABS IMMATURE GRANULOCYTES: 0.01 10*3/uL (ref 0.00–0.07)
BASOS ABS: 0 10*3/uL (ref 0.0–0.1)
Basophils Relative: 1 %
Eosinophils Absolute: 0.1 10*3/uL (ref 0.0–0.5)
Eosinophils Relative: 3 %
HCT: 27.9 % — ABNORMAL LOW (ref 36.0–46.0)
HEMOGLOBIN: 8.6 g/dL — AB (ref 12.0–15.0)
Immature Granulocytes: 0 %
LYMPHS PCT: 29 %
Lymphs Abs: 1.3 10*3/uL (ref 0.7–4.0)
MCH: 29.8 pg (ref 26.0–34.0)
MCHC: 30.8 g/dL (ref 30.0–36.0)
MCV: 96.5 fL (ref 80.0–100.0)
Monocytes Absolute: 0.5 10*3/uL (ref 0.1–1.0)
Monocytes Relative: 12 %
NEUTROS ABS: 2.4 10*3/uL (ref 1.7–7.7)
NEUTROS PCT: 55 %
NRBC: 0 % (ref 0.0–0.2)
Platelets: 238 10*3/uL (ref 150–400)
RBC: 2.89 MIL/uL — AB (ref 3.87–5.11)
RDW: 13.3 % (ref 11.5–15.5)
WBC: 4.3 10*3/uL (ref 4.0–10.5)

## 2018-05-02 LAB — GLUCOSE, CAPILLARY: Glucose-Capillary: 191 mg/dL — ABNORMAL HIGH (ref 70–99)

## 2018-05-02 NOTE — Progress Notes (Signed)
   CC: generalized malaise  HPI:   [name] is a 84 y.o. female with PMHx as below who presents for acute onset malaise and generalized weakness that began last night. She denies headaches, fevers, chills, chest pain, shortness of breath, abdominal pain, n/v/d, burning with urination, or urinary frequency.  She presented to the ED on 10/5 for abdominal pain. Before she had been evaluated, she had a BM which resolved her symptoms and she returned home. Patient was feeling in her usual state of health until last night when it felt like she "got hit by a bus." No sick contacts at home.  She admits to not taking her Hydralazine as prescribed; usually only take it once a day, as opposed to three times daily. Also admits to not sleeping much the last two nights. Her  daughter-in-law is with her today and endorses that [name] does not look like herself.   Past Medical History:  Diagnosis Date  . Anemia     normocytic anemia with baseline hemoglobin 10-11  . Blindness of left eye     likely related to stroke, left cataract removed from that eye with complications  . Calculus gallbladder and bile duct with cholecystitis with obstruction 01/2015  . Chronic kidney disease 2006    left renal artery stenosis with probable hemodynamic significance, kidneys are normal in morphology without focal lesions or hydronephrosis this is based but cannot A. of the abdomen with and without contrast done on the 31st 2006  . CKD (chronic kidney disease), stage III ([place])   . CVA (cerebral vascular accident) ([place]) 1990's  . CVA (cerebrovascular accident) [facility])  October 2007    CT of the head atrophy with multiple remote insults noted but no definite acute findings  . Glaucoma   . Gout   . Hyperlipidemia   . Hypertension   . Personal history of DVT (deep vein thrombosis) 08/11/2010   BLE  . Type II diabetes mellitus ([place])    Review of Systems:  Per HPI  Physical Exam:   Vitals:   05/02/18 1529  BP:  (!) 196/67  Pulse: 62  Temp: 98 F (36.7 C)  TempSrc: Oral  SpO2: 100%  Weight: 134 lb (60.8 kg)  Height: 5\' 3"  (1.6 m)   General: A&Ox2; tired and ill appearing; answers questions appropriately CV: RRR; no murmurs, rubs or gallops Resp: normal respiratory effort; lungs CTA bilaterally without wheezes or crackles GI: BS+; abdomen soft, non-tender MSK: 1+ pitting edema at bilateral ankles; clonidine patch in place on RUE Neuro: Cranial nerves II-XII intact. Follows commands appropriately. Strength is 5/5 throughout; sensation intact.     Assessment & Plan:   See Encounters Tab for problem based charting.  Patient seen with Dr. Eppie Gibson

## 2018-05-02 NOTE — Assessment & Plan Note (Addendum)
Chelsea Mcdaniel presents with acute onset malaise and generalized weakness that began last night. Denies any other systemic symptoms. She is afebrile today in clinic. U/A was negative for infection. BUN and creatinine are around her baseline. No other electrolyte abnormalities or hypoglycemia. CBC showed normal white count and mildly decreased hemoglobin compared to her chronic anemia (Hgb 8.6; was 9.9 in 11/2017). This is not likely to cause her acute onset symptoms. Based on largely unremarkable work-up, this is most likely related to poor sleep the past two nights versus a viral syndrome. Uncontrolled blood pressure may also be playing a role in the . Strongly encouraged her to take the Hydralazine TID as prescribed. Will send her home with and close follow-up in [facility] on Friday. At that time, will order iron panel to further evaluate her anemia. Told daughter-in-law to call if she develops fever or any other symptoms in addition to malaise and weakness.

## 2018-05-03 ENCOUNTER — Encounter: Payer: Self-pay | Admitting: Internal Medicine

## 2018-05-03 ENCOUNTER — Telehealth: Payer: Self-pay | Admitting: Internal Medicine

## 2018-05-03 LAB — IRON AND TIBC
IRON: 34 ug/dL (ref 28–170)
Saturation Ratios: 17 % (ref 10.4–31.8)
TIBC: 199 ug/dL — ABNORMAL LOW (ref 250–450)
UIBC: 165 ug/dL

## 2018-05-03 LAB — FERRITIN: FERRITIN: 223 ng/mL (ref 11–307)

## 2018-05-03 NOTE — Progress Notes (Signed)
I saw and evaluated the patient. I personally confirmed the key portions of Dr. [name] history and exam and reviewed pertinent patient test results. The assessment, diagnosis, and plan were formulated together and I agree with the documentation in the resident's note.  Chelsea Mcdaniel was unable to characterize her malaise despite an extensive review of systems, all of which was negative other than fatigue.  Examination did not point to a specific etiology either.  UA, blood glucose, and BMP were unremarkable or unchanged from baseline.  She was w/o a leukocytosis, but had a moderate anemia with a Hgb just over 8.0.  She did admit that although she normally sleeps well, she has been unable to get a refreshing sleep over the last 2 nights.  She was unable to explain why this was and denied anxiety or depression.    Our working diagnosis is fatigue secondary to poor sleep over the last 2 days or a yet undifferentiated viral infection.  Her anemia may be contributing, but is not a sole explanation.  We will need to get an iron profile on return this Friday, but I believe this most likely is an anemia of chronic kidney disease.  Her hypertension may also be contributing to her generalized malaise, but is also not likely the sole cause.  For that we stressed the importance of taking her hydralazine three times a day as recommended.  Without an explanation for her symptoms we will bring her back later this week to reassess.  She and her caregiver know to call the clinic sooner if her symptoms worsen in the interim.

## 2018-05-03 NOTE — Patient Instructions (Signed)
Chelsea Mcdaniel,  It was a pleasure meeting you. I am sorry you are feeling bad. Your lab work has not shown any signs of infection or electrolyte abnormalities to explain your symptoms. It is likely this is due to lack of sleep or a viral illness. We will see you back in the clinic on Friday to monitor for improvement. Call before then if you develop a fever or any other symptoms.   Please ensure you are taking the Hydralazine three times daily for your blood pressure.   Take care, and we'll see you in a few days!

## 2018-05-03 NOTE — Telephone Encounter (Signed)
Spoke to daughter-in-law, [name], regarding patient's lab work. Her electrolytes and kidney function are stable. She does not have an elevated white count. Her hgb is slightly lower than normal, and we will check iron studies when she follows up with [place] on Friday at [facility] her to call us if Ms. Boccio develops fevers, becomes mor confused, or has any other symptoms.

## 2018-05-05 ENCOUNTER — Telehealth: Payer: Self-pay | Admitting: Internal Medicine

## 2018-05-05 ENCOUNTER — Ambulatory Visit: Payer: Self-pay

## 2018-05-05 NOTE — Telephone Encounter (Signed)
Left message for [name] after I heard she had cancelled appointment for today since Ms. Stella was feeling much better. Glad she is doing well and told her to call with questions or to come in next week if she starts feeling poorly again.

## 2018-05-11 ENCOUNTER — Other Ambulatory Visit: Payer: Self-pay | Admitting: Internal Medicine

## 2018-05-15 ENCOUNTER — Ambulatory Visit (INDEPENDENT_AMBULATORY_CARE_PROVIDER_SITE_OTHER): Payer: Medicare Other | Admitting: Pharmacist

## 2018-05-15 DIAGNOSIS — Z86718 Personal history of other venous thrombosis and embolism: Secondary | ICD-10-CM | POA: Diagnosis not present

## 2018-05-15 DIAGNOSIS — Z5181 Encounter for therapeutic drug level monitoring: Secondary | ICD-10-CM

## 2018-05-15 DIAGNOSIS — Z7901 Long term (current) use of anticoagulants: Secondary | ICD-10-CM

## 2018-05-15 LAB — POCT INR: INR: 2.5 (ref 2.0–3.0)

## 2018-05-15 NOTE — Patient Instructions (Signed)
Patient instructed to take medications as defined in the Anti-coagulation Track section of this encounter.  Patient instructed to take today's dose.  Patient instructed to take 1 tablet by mouth once-daily at 6PM all days of week, EXCEPT on MONDAYS and THURSDAYS. Take only 1/2 tablet on MONDAYS and THURSDAYS.  Patient verbalized understanding of these instructions.

## 2018-05-15 NOTE — Progress Notes (Signed)
Anticoagulation Management Chelsea Mcdaniel is a 84 y.o. female who reports to the clinic for monitoring of warfarin treatment.    Indication: DVT, History of; Long term current use of anticoagulant.   Duration: indefinite Supervising physician: Felicity Clinic Visit History: Patient does not report signs/symptoms of bleeding or thromboembolism  Other recent changes: No diet, medications, lifestyle changes endorsed at this visit.  Anticoagulation Episode Summary    Current INR goal:   2.0-3.0  TTR:   75.0 % (4.3 y)  Next INR check:   06/12/2018  INR from last check:   2.5 (05/15/2018)  Weekly max warfarin dose:     Target end date:     INR check location:     Preferred lab:     Send INR reminders to:      Indications   EMBOLISM/THROMBOSIS DEEP VSL LWR EXTRM NOS (Resolved) [I82.409] Long-term (current) use of anticoagulants [Z79.01]       Comments:           Allergies  Allergen Reactions  . Diltiazem Other (See Comments)    Symptomatic bradaycardia  . Metoprolol Other (See Comments)    Symptomatic bradycardia   Prior to Admission medications   Medication Sig Start Date End Date Taking? Authorizing Provider  ACCU-CHEK SOFTCLIX LANCETS lancets Used to check blood sugar 3 times daily. Dx code E11.8 06/08/17  Yes Chundi, [name], MD  allopurinol (ZYLOPRIM) 100 MG tablet TAKE ONE AND A HALF TABLETS BY MOUTH EVERY DAY 10/31/17  Yes Chundi, [name], MD  Blood Glucose Monitoring Suppl (ACCU-CHEK AVIVA PLUS) w/Device KIT Check blood sugar 1 time a day 03/08/18  Yes Chundi, [name], MD  cloNIDine (CATAPRES - DOSED IN MG/24 HR) 0.2 mg/24hr patch Place 1 patch (0.2 mg total) onto the skin once a week. 02/28/18  Yes Chundi, [name], MD  colchicine 0.6 MG tablet Take 0.6 mg by mouth as needed.   Yes [provider]  furosemide (LASIX) 10 MG/ML solution Take 8 mLs (80 mg total) by mouth daily. 09/06/17  Yes Chundi, [name], MD  glucose blood (ACCU-CHEK AVIVA  PLUS) test strip USE 1 TIME DAILY TO CHECK BLOOD SUGAR 09/03/17  Yes Chundi, [name], MD  glucose blood (ACCU-CHEK AVIVA) test strip Use as instructed 05/03/11 09/03/18 Yes Sharda, Neema K, MD  hydrALAZINE (APRESOLINE) 100 MG tablet Take 1 tablet (100 mg total) by mouth 3 (three) times daily. 02/28/18 05/29/18 Yes Chundi, [name], MD  JANUVIA 25 MG tablet TAKE 1 TABLET BY MOUTH EVERY DAY 10/04/17  Yes Chundi, [name], MD  Lancets Misc. (ACCU-CHEK SOFTCLIX LANCET DEV) KIT Use to check blood sugar one time a day 09/03/17  Yes Chundi, [name], MD  LUMIGAN 0.01 % SOLN Place 1 drop into both eyes every morning.  12/05/13  Yes [provider]  polyethylene glycol (MIRALAX) packet Take 17 g by mouth 2 (two) times daily. 03/25/17  Yes [name], MD  warfarin (COUMADIN) 5 MG tablet TAKE 1 TABLET BY MOUTH EVERY DAY AT 6 pm EXCEPT TAKE 1/2 TABLET ON monday AND thursday 01/23/18  Yes Chundi, [name], MD  warfarin (COUMADIN) 5 MG tablet TAKE 1 TABLET BY MOUTH EVERY DAY AT 6 PM EXCEPT [facility] AND FRIDAY TAKE 1 & 1/2 TABLETS ON THOSE DAYS 05/12/18  Yes [name], MD   Past Medical History:  Diagnosis Date  . Anemia     normocytic anemia with baseline hemoglobin 10-11  . Blindness of left eye     likely related to stroke,  left cataract removed from that eye with complications  . Calculus gallbladder and bile duct with cholecystitis with obstruction 01/2015  . Chronic kidney disease 2006    left renal artery stenosis with probable hemodynamic significance, kidneys are normal in morphology without focal lesions or hydronephrosis this is based but cannot A. of the abdomen with and without contrast done on the 31st 2006  . CKD (chronic kidney disease), stage III ([place])   . CVA (cerebral vascular accident) ([place]) 1990's  . CVA (cerebrovascular accident) [facility])  October 2007    CT of the head atrophy with multiple remote insults noted but no definite acute findings  . Glaucoma   . Gout   . Hyperlipidemia   .  Hypertension   . Personal history of DVT (deep vein thrombosis) 08/11/2010   BLE  . Type II diabetes mellitus ([place])    Social History   Socioeconomic History  . Marital status: Widowed    Spouse name: Not on file  . Number of children: Not on file  . Years of education: Not on file  . Highest education level: Not on file  Occupational History  . Not on file  Social Needs  . Financial resource strain: Not on file  . Food insecurity:    Worry: Not on file    Inability: Not on file  . Transportation needs:    Medical: Not on file    Non-medical: Not on file  Tobacco Use  . Smoking status: Never Smoker  . Smokeless tobacco: Never Used  Substance and Sexual Activity  . Alcohol use: No    Alcohol/week: 0.0 standard drinks  . Drug use: No  . Sexual activity: Never  Lifestyle  . Physical activity:    Days per week: Not on file    Minutes per session: Not on file  . Stress: Not on file  Relationships  . Social connections:    Talks on phone: Not on file    Gets together: Not on file    Attends religious service: Not on file    Active member of club or organization: Not on file    Attends meetings of clubs or organizations: Not on file    Relationship status: Not on file  Other Topics Concern  . Not on file  Social History Narrative    Patient is a widow. She has 11 children 5 of who are living. She is retired in 1993 from [employer]. She denies tobacco alcohol or drug use.   Family History  Problem Relation Age of Onset  . Heart disease Mother   . Diabetes Mother   . Hyperlipidemia Mother   . Hypertension Mother   . Heart disease Father   . Diabetes Father   . Hyperlipidemia Father   . Hypertension Father     ASSESSMENT Recent Results: The most recent result is correlated with 30 mg per week: Lab Results  Component Value Date   INR 2.5 05/15/2018   INR 2.8 03/13/2018   INR 2.1 02/13/2018    Anticoagulation Dosing: Description   Take 1 tablet by mouth  once-daily at 6PM all days of week, EXCEPT on MONDAYS and THURSDAYS. Take only 1/2 tablet on MONDAYS and THURSDAYS.       INR today: Therapeutic  PLAN Weekly dose was unchanged.    Patient Instructions  Patient instructed to take medications as defined in the Anti-coagulation Track section of this encounter.  Patient instructed to take today's dose.  Patient instructed to take  1 tablet by mouth once-daily at 6PM all days of week, EXCEPT on MONDAYS and THURSDAYS. Take only 1/2 tablet on MONDAYS and THURSDAYS.  Patient verbalized understanding of these instructions.     Patient advised to contact clinic or seek medical attention if signs/symptoms of bleeding or thromboembolism occur.  Patient verbalized understanding by repeating back information and was advised to contact me if further medication-related questions arise. Patient was also provided an information handout.  Follow-up Return in 4 weeks (on 06/12/2018) for Follow up INR at 2:30PM.  Pennie Banter  15 minutes spent face-to-face with the patient during the encounter. 50% of time spent on education. 50% of time was spent on fingerstick point of care INR sample collection, processing, results determination, and documentation in [email or web address].[email or web address]/.

## 2018-06-12 ENCOUNTER — Encounter (INDEPENDENT_AMBULATORY_CARE_PROVIDER_SITE_OTHER): Payer: Self-pay

## 2018-06-12 ENCOUNTER — Ambulatory Visit (INDEPENDENT_AMBULATORY_CARE_PROVIDER_SITE_OTHER): Payer: Medicare Other | Admitting: Pharmacist

## 2018-06-12 ENCOUNTER — Other Ambulatory Visit: Payer: Self-pay | Admitting: Internal Medicine

## 2018-06-12 DIAGNOSIS — Z7901 Long term (current) use of anticoagulants: Secondary | ICD-10-CM

## 2018-06-12 DIAGNOSIS — Z86718 Personal history of other venous thrombosis and embolism: Secondary | ICD-10-CM

## 2018-06-12 DIAGNOSIS — I10 Essential (primary) hypertension: Principal | ICD-10-CM

## 2018-06-12 DIAGNOSIS — Z5181 Encounter for therapeutic drug level monitoring: Secondary | ICD-10-CM

## 2018-06-12 DIAGNOSIS — M1A3710 Chronic gout due to renal impairment, right ankle and foot, without tophus (tophi): Secondary | ICD-10-CM

## 2018-06-12 DIAGNOSIS — E1159 Type 2 diabetes mellitus with other circulatory complications: Secondary | ICD-10-CM

## 2018-06-12 LAB — POCT INR: INR: 3.5 — AB (ref 2.0–3.0)

## 2018-06-12 NOTE — Progress Notes (Signed)
Anticoagulation Management Chelsea Mcdaniel is a 84 y.o. female who reports to the clinic for monitoring of warfarin treatment.    Indication: DVT, History of; Long term current use of anticoagulant.   Duration: indefinite Supervising physician: Joni Reining  Anticoagulation Clinic Visit History: Patient does not report signs/symptoms of bleeding or thromboembolism  Other recent changes: No diet, medications, lifestyle changes endorsed at this visit.   Anticoagulation Episode Summary    Current INR goal:   2.0-3.0  TTR:   74.5 % (4.4 y)  Next INR check:   06/12/2018  INR from last check:   3.5! (06/12/2018)  Weekly max warfarin dose:     Target end date:     INR check location:     Preferred lab:     Send INR reminders to:      Indications   EMBOLISM/THROMBOSIS DEEP VSL LWR EXTRM NOS (Resolved) [I82.409] Long-term (current) use of anticoagulants [Z79.01]       Comments:          Allergies  Allergen Reactions  . Diltiazem Other (See Comments)    Symptomatic bradaycardia  . Metoprolol Other (See Comments)    Symptomatic bradycardia   Medication Sig  ACCU-CHEK SOFTCLIX LANCETS lancets Used to check blood sugar 3 times daily. Dx code E11.8  allopurinol (ZYLOPRIM) 100 MG tablet Take 1 tablet (100 mg total) by mouth daily.  Blood Glucose Monitoring Suppl (ACCU-CHEK AVIVA PLUS) w/Device KIT Check blood sugar 1 time a day  cloNIDine (CATAPRES - DOSED IN MG/24 HR) 0.2 mg/24hr patch Place 1 patch (0.2 mg total) onto the skin once a week.  colchicine 0.6 MG tablet Take 0.6 mg by mouth as needed.  furosemide (LASIX) 10 MG/ML solution Take 8 mLs (80 mg total) by mouth daily.  glucose blood (ACCU-CHEK AVIVA PLUS) test strip USE 1 TIME DAILY TO CHECK BLOOD SUGAR  glucose blood (ACCU-CHEK AVIVA) test strip Use as instructed  hydrALAZINE (APRESOLINE) 100 MG tablet Take 1 tablet (100 mg total) by mouth 3 (three) times daily.  JANUVIA 25 MG tablet TAKE 1 TABLET BY MOUTH EVERY DAY   Lancets Misc. (ACCU-CHEK SOFTCLIX LANCET DEV) KIT Use to check blood sugar one time a day  LUMIGAN 0.01 % SOLN Place 1 drop into both eyes every morning.   polyethylene glycol (MIRALAX) packet Take 17 g by mouth 2 (two) times daily.  warfarin (COUMADIN) 5 MG tablet TAKE 1 TABLET BY MOUTH EVERY DAY AT 6 pm EXCEPT TAKE 1/2 TABLET ON monday AND thursday  warfarin (COUMADIN) 5 MG tablet TAKE 1 TABLET BY MOUTH EVERY DAY AT 6 PM EXCEPT WEDNESDAY AND FRIDAY TAKE 1 & 1/2 TABLETS ON THOSE DAYS   Past Medical History:  Diagnosis Date  . Anemia     normocytic anemia with baseline hemoglobin 10-11  . Blindness of left eye     likely related to stroke, left cataract removed from that eye with complications  . Calculus gallbladder and bile duct with cholecystitis with obstruction 01/2015  . Chronic kidney disease 2006    left renal artery stenosis with probable hemodynamic significance, kidneys are normal in morphology without focal lesions or hydronephrosis this is based but cannot A. of the abdomen with and without contrast done on the 31st 2006  . CKD (chronic kidney disease), stage III ([place])   . CVA (cerebral vascular accident) ([place]) 1990's  . CVA (cerebrovascular accident) [facility])  October 2007    CT of the head atrophy with multiple remote insults noted but  no definite acute findings  . Glaucoma   . Gout   . Hyperlipidemia   . Hypertension   . Personal history of DVT (deep vein thrombosis) 08/11/2010   BLE  . Type II diabetes mellitus ([place])    Social History   Socioeconomic History  . Marital status: Widowed    Spouse name: Not on file  . Number of children: Not on file  . Years of education: Not on file  . Highest education level: Not on file  Occupational History  . Not on file  Social Needs  . Financial resource strain: Not on file  . Food insecurity:    Worry: Not on file    Inability: Not on file  . Transportation needs:    Medical: Not on file    Non-medical: Not on file   Tobacco Use  . Smoking status: Never Smoker  . Smokeless tobacco: Never Used  Substance and Sexual Activity  . Alcohol use: No    Alcohol/week: 0.0 standard drinks  . Drug use: No  . Sexual activity: Never  Lifestyle  . Physical activity:    Days per week: Not on file    Minutes per session: Not on file  . Stress: Not on file  Relationships  . Social connections:    Talks on phone: Not on file    Gets together: Not on file    Attends religious service: Not on file    Active member of club or organization: Not on file    Attends meetings of clubs or organizations: Not on file    Relationship status: Not on file  Other Topics Concern  . Not on file  Social History Narrative    Patient is a widow. She has 11 children 5 of who are living. She is retired in 1993 from [employer]. She denies tobacco alcohol or drug use.   Family History  Problem Relation Age of Onset  . Heart disease Mother   . Diabetes Mother   . Hyperlipidemia Mother   . Hypertension Mother   . Heart disease Father   . Diabetes Father   . Hyperlipidemia Father   . Hypertension Father    ASSESSMENT Lab Results  Component Value Date   INR 3.5 (A) 06/12/2018   INR 2.5 05/15/2018   INR 2.8 03/13/2018   Anticoagulation Dosing: Description   Take 1 tablet by mouth once-daily at 6PM all days of week, EXCEPT on MONDAYS and THURSDAYS. Take only 1/2 tablet on MONDAYS and THURSDAYS.       INR today: Supratherapeutic  PLAN Weekly dose was decreased by 8%  Patient Instructions  Patient educated about medication as defined in this encounter and verbalized understanding by repeating back instructions provided.   Patient advised to contact clinic or seek medical attention if signs/symptoms of bleeding or thromboembolism occur.  Patient verbalized understanding by repeating back information and was advised to contact me if further medication-related questions arise. Patient was also provided an information  handout.  Follow-up Return in about 3 weeks (around 07/03/2018).  Flossie Dibble

## 2018-06-12 NOTE — Patient Instructions (Signed)
Patient educated about medication as defined in this encounter and verbalized understanding by repeating back instructions provided.   

## 2018-06-14 NOTE — Progress Notes (Signed)
INTERNAL MEDICINE TEACHING ATTENDING ADDENDUM - Chelsea Groves, DO Duration- indefinate, Indication- VTE, INR- supratheraputic Lab Results  Component Value Date   INR 3.5 (A) 06/12/2018  . Agree with pharmacy recommendations as outlined in their note.

## 2018-06-20 DIAGNOSIS — I129 Hypertensive chronic kidney disease with stage 1 through stage 4 chronic kidney disease, or unspecified chronic kidney disease: Secondary | ICD-10-CM | POA: Diagnosis not present

## 2018-06-20 DIAGNOSIS — N2581 Secondary hyperparathyroidism of renal origin: Secondary | ICD-10-CM | POA: Diagnosis not present

## 2018-06-20 DIAGNOSIS — H2702 Aphakia, left eye: Secondary | ICD-10-CM | POA: Diagnosis not present

## 2018-06-20 DIAGNOSIS — N184 Chronic kidney disease, stage 4 (severe): Secondary | ICD-10-CM | POA: Diagnosis not present

## 2018-06-20 DIAGNOSIS — H401133 Primary open-angle glaucoma, bilateral, severe stage: Secondary | ICD-10-CM | POA: Diagnosis not present

## 2018-06-20 DIAGNOSIS — D631 Anemia in chronic kidney disease: Secondary | ICD-10-CM | POA: Diagnosis not present

## 2018-06-20 DIAGNOSIS — N189 Chronic kidney disease, unspecified: Secondary | ICD-10-CM | POA: Diagnosis not present

## 2018-06-20 DIAGNOSIS — H338 Other retinal detachments: Secondary | ICD-10-CM | POA: Diagnosis not present

## 2018-06-20 DIAGNOSIS — R600 Localized edema: Secondary | ICD-10-CM | POA: Diagnosis not present

## 2018-06-20 DIAGNOSIS — H2511 Age-related nuclear cataract, right eye: Secondary | ICD-10-CM | POA: Diagnosis not present

## 2018-06-28 ENCOUNTER — Other Ambulatory Visit: Payer: Self-pay | Admitting: Nephrology

## 2018-06-28 DIAGNOSIS — N184 Chronic kidney disease, stage 4 (severe): Secondary | ICD-10-CM

## 2018-06-30 ENCOUNTER — Other Ambulatory Visit: Payer: Self-pay

## 2018-07-03 ENCOUNTER — Other Ambulatory Visit (INDEPENDENT_AMBULATORY_CARE_PROVIDER_SITE_OTHER): Payer: Medicare Other

## 2018-07-03 ENCOUNTER — Ambulatory Visit: Payer: Self-pay

## 2018-07-03 DIAGNOSIS — Z7901 Long term (current) use of anticoagulants: Secondary | ICD-10-CM

## 2018-07-03 LAB — POCT INR: INR: 1.1 — AB (ref 2.0–3.0)

## 2018-07-03 NOTE — Progress Notes (Signed)
Patient here with family member [place] who handles her medication, medication has been placed out per Dr Gladstone Pih recommendations, but she thinks Dinna does not always take the medication (in fact she says she knows she skipped yesterdays), no significant change to diet.   Current Warfarin 5mg  daily except 2.5mg  on Monday and Thursday.  INR is 1.1, last INR was 3.5 on same dose.  It appears she has had some fluctuation in the past despite no dose change in this case I will not make any change today, will send this result over the Dr Elie Confer for further guidance.

## 2018-07-04 ENCOUNTER — Ambulatory Visit
Admission: RE | Admit: 2018-07-04 | Discharge: 2018-07-04 | Disposition: A | Discharge status: Home / Self Care | Payer: Medicare Other | Source: Ambulatory Visit | Attending: Nephrology | Admitting: Nephrology

## 2018-07-04 DIAGNOSIS — N184 Chronic kidney disease, stage 4 (severe): Secondary | ICD-10-CM | POA: Diagnosis not present

## 2018-07-10 ENCOUNTER — Ambulatory Visit (INDEPENDENT_AMBULATORY_CARE_PROVIDER_SITE_OTHER): Payer: Medicare Other | Admitting: Pharmacist

## 2018-07-10 ENCOUNTER — Other Ambulatory Visit: Payer: Self-pay | Admitting: Internal Medicine

## 2018-07-10 ENCOUNTER — Encounter: Payer: Self-pay | Admitting: Pharmacist

## 2018-07-10 DIAGNOSIS — Z7901 Long term (current) use of anticoagulants: Secondary | ICD-10-CM | POA: Diagnosis not present

## 2018-07-10 DIAGNOSIS — Z86718 Personal history of other venous thrombosis and embolism: Secondary | ICD-10-CM

## 2018-07-10 DIAGNOSIS — Z5181 Encounter for therapeutic drug level monitoring: Secondary | ICD-10-CM

## 2018-07-10 NOTE — Patient Instructions (Signed)
Patient instructed to take medications as defined in the Anti-coagulation Track section of this encounter.  Patient instructed to take today's dose.  Patient instructed to take 1 tablet by mouth once-daily at 6PM all days of week, EXCEPT on [facility] and FRIDAYS. Take only 1/2 tablet on [facility] and FRIDAYS of each week. Repeat this regimen weekly until seen next in anticoagulation management clinic.  Patient verbalized understanding of these instructions.

## 2018-07-10 NOTE — Progress Notes (Signed)
Anticoagulation Management Chelsea Mcdaniel is a 84 y.o. female who reports to the clinic for monitoring of warfarin treatment.    Indication: DVT  (Resolved) [182.409]; Long term current use of anticoagulants [Z79.01].  Duration: indefinite Supervising physician: Joni Reining  Anticoagulation Clinic Visit History: Patient does not report signs/symptoms of bleeding or thromboembolism  Other recent changes: No diet, medications, lifestyle changes cited by patient or family member.  Anticoagulation Episode Summary    Current INR goal:   2.0-3.0  TTR:   74.1 % (4.5 y)  Next INR check:   08/07/2018  INR from last check:     Weekly max warfarin dose:     Target end date:     INR check location:     Preferred lab:     Send INR reminders to:      Indications   EMBOLISM/THROMBOSIS DEEP VSL LWR EXTRM NOS (Resolved) [I82.409] Long-term (current) use of anticoagulants [Z79.01]       Comments:           Allergies  Allergen Reactions  . Diltiazem Other (See Comments)    Symptomatic bradaycardia  . Metoprolol Other (See Comments)    Symptomatic bradycardia   Prior to Admission medications   Medication Sig Start Date End Date Taking? Authorizing Provider  ACCU-CHEK SOFTCLIX LANCETS lancets Used to check blood sugar 3 times daily. Dx code E11.8 06/08/17  Yes Chundi, Verne Spurr, MD  allopurinol (ZYLOPRIM) 100 MG tablet Take 1 tablet (100 mg total) by mouth daily. 06/12/18 09/10/18 Yes Chundi, [name], MD  Blood Glucose Monitoring Suppl (ACCU-CHEK AVIVA PLUS) w/Device KIT Check blood sugar 1 time a day 03/08/18  Yes Chundi, [name], MD  cloNIDine (CATAPRES - DOSED IN MG/24 HR) 0.2 mg/24hr patch Place 1 patch (0.2 mg total) onto the skin once a week. 02/28/18  Yes Chundi, [name], MD  colchicine 0.6 MG tablet Take 0.6 mg by mouth as needed.   Yes [provider]  furosemide (LASIX) 10 MG/ML solution Take 8 mLs (80 mg total) by mouth daily. 09/06/17  Yes Chundi, [name], MD  glucose blood  (ACCU-CHEK AVIVA PLUS) test strip USE 1 TIME DAILY TO CHECK BLOOD SUGAR 09/03/17  Yes Chundi, [name], MD  glucose blood (ACCU-CHEK AVIVA) test strip Use as instructed 05/03/11 09/03/18 Yes Sharda, Neema K, MD  JANUVIA 25 MG tablet TAKE 1 TABLET BY MOUTH EVERY DAY 06/12/18  Yes Chundi, [name], MD  Lancets Misc. (ACCU-CHEK SOFTCLIX LANCET DEV) KIT Use to check blood sugar one time a day 09/03/17  Yes Chundi, [name], MD  LUMIGAN 0.01 % SOLN Place 1 drop into both eyes every morning.  12/05/13  Yes [provider]  polyethylene glycol (MIRALAX) packet Take 17 g by mouth 2 (two) times daily. 03/25/17  Yes [name], MD  warfarin (COUMADIN) 5 MG tablet TAKE 1 TABLET BY MOUTH EVERY DAY AT 6 pm EXCEPT TAKE 1/2 TABLET ON monday AND thursday 01/23/18  Yes Chundi, [name], MD  warfarin (COUMADIN) 5 MG tablet TAKE 1 TABLET BY MOUTH EVERY DAY AT 6 PM EXCEPT [facility] AND FRIDAY TAKE 1 & 1/2 TABLETS ON THOSE DAYS 07/10/18  Yes Chundi, [name], MD  hydrALAZINE (APRESOLINE) 100 MG tablet Take 1 tablet (100 mg total) by mouth 3 (three) times daily. 02/28/18 05/29/18  [name], MD   Past Medical History:  Diagnosis Date  . Anemia     normocytic anemia with baseline hemoglobin 10-11  . Blindness of left eye     likely related to  stroke, left cataract removed from that eye with complications  . Calculus gallbladder and bile duct with cholecystitis with obstruction 01/2015  . Chronic kidney disease 2006    left renal artery stenosis with probable hemodynamic significance, kidneys are normal in morphology without focal lesions or hydronephrosis this is based but cannot A. of the abdomen with and without contrast done on the 31st 2006  . CKD (chronic kidney disease), stage III ([place])   . CVA (cerebral vascular accident) ([place]) 1990's  . CVA (cerebrovascular accident) [facility])  October 2007    CT of the head atrophy with multiple remote insults noted but no definite acute findings  . Glaucoma   . Gout   .  Hyperlipidemia   . Hypertension   . Personal history of DVT (deep vein thrombosis) 08/11/2010   BLE  . Type II diabetes mellitus ([place])    Social History   Socioeconomic History  . Marital status: Widowed    Spouse name: Not on file  . Number of children: Not on file  . Years of education: Not on file  . Highest education level: Not on file  Occupational History  . Not on file  Social Needs  . Financial resource strain: Not on file  . Food insecurity:    Worry: Not on file    Inability: Not on file  . Transportation needs:    Medical: Not on file    Non-medical: Not on file  Tobacco Use  . Smoking status: Never Smoker  . Smokeless tobacco: Never Used  Substance and Sexual Activity  . Alcohol use: No    Alcohol/week: 0.0 standard drinks  . Drug use: No  . Sexual activity: Never  Lifestyle  . Physical activity:    Days per week: Not on file    Minutes per session: Not on file  . Stress: Not on file  Relationships  . Social connections:    Talks on phone: Not on file    Gets together: Not on file    Attends religious service: Not on file    Active member of club or organization: Not on file    Attends meetings of clubs or organizations: Not on file    Relationship status: Not on file  Other Topics Concern  . Not on file  Social History Narrative    Patient is a widow. She has 11 children 5 of who are living. She is retired in 1993 from [employer]. She denies tobacco alcohol or drug use.   Family History  Problem Relation Age of Onset  . Heart disease Mother   . Diabetes Mother   . Hyperlipidemia Mother   . Hypertension Mother   . Heart disease Father   . Diabetes Father   . Hyperlipidemia Father   . Hypertension Father     ASSESSMENT Recent Results: The most recent result is correlated with 27.5 mg per week: Lab Results  Component Value Date   INR 1.1 (A) 07/03/2018   INR 3.5 (A) 06/12/2018   INR 2.5 05/15/2018    Anticoagulation  Dosing: Description   Take 1 tablet by mouth once-daily at 6PM all days of week, EXCEPT on [facility] and FRIDAYS. Take only 1/2 tablet on [facility] and FRIDAYS of each week. Repeat this regimen weekly until seen next in anticoagulation management clinic.      INR today: Therapeutic  PLAN Weekly dose was increased by 9% to 30 mg per week  Patient Instructions  Patient instructed to take medications as  defined in the Anti-coagulation Track section of this encounter.  Patient instructed to take today's dose.  Patient instructed to take 1 tablet by mouth once-daily at 6PM all days of week, EXCEPT on [facility] and FRIDAYS. Take only 1/2 tablet on [facility] and FRIDAYS of each week. Repeat this regimen weekly until seen next in anticoagulation management clinic.  Patient verbalized understanding of these instructions.     Patient advised to contact clinic or seek medical attention if signs/symptoms of bleeding or thromboembolism occur.  Patient verbalized understanding by repeating back information and was advised to contact me if further medication-related questions arise. Patient was also provided an information handout.  Follow-up Return in 4 weeks (on 08/07/2018) for Follow up INR at 3:15PM.  Pennie Banter, PharmD, CPP  15 minutes spent face-to-face with the patient during the encounter. 50% of time spent on education. 50% of time was spent on fingerstick point of care INR sample collection, processing, results determination, dose adjustment and documentation in [email or web address].[email or web address]/.

## 2018-07-11 ENCOUNTER — Inpatient Hospital Stay (HOSPITAL_COMMUNITY): Payer: Medicare Other

## 2018-07-11 ENCOUNTER — Inpatient Hospital Stay (HOSPITAL_COMMUNITY)
Admission: EM | Admit: 2018-07-11 | Discharge: 2018-07-17 | DRG: 312 | Disposition: A | Discharge status: Skilled Nursing Facility | Payer: Medicare Other | Attending: Internal Medicine | Admitting: Internal Medicine

## 2018-07-11 ENCOUNTER — Emergency Department (HOSPITAL_COMMUNITY): Payer: Medicare Other

## 2018-07-11 ENCOUNTER — Other Ambulatory Visit: Payer: Self-pay

## 2018-07-11 ENCOUNTER — Encounter (HOSPITAL_COMMUNITY): Payer: Self-pay | Admitting: *Deleted

## 2018-07-11 DIAGNOSIS — H5462 Unqualified visual loss, left eye, normal vision right eye: Secondary | ICD-10-CM | POA: Diagnosis present

## 2018-07-11 DIAGNOSIS — M4802 Spinal stenosis, cervical region: Secondary | ICD-10-CM | POA: Diagnosis not present

## 2018-07-11 DIAGNOSIS — E43 Unspecified severe protein-calorie malnutrition: Secondary | ICD-10-CM | POA: Diagnosis not present

## 2018-07-11 DIAGNOSIS — I959 Hypotension, unspecified: Secondary | ICD-10-CM | POA: Diagnosis not present

## 2018-07-11 DIAGNOSIS — I951 Orthostatic hypotension: Principal | ICD-10-CM | POA: Diagnosis present

## 2018-07-11 DIAGNOSIS — I472 Ventricular tachycardia: Secondary | ICD-10-CM | POA: Diagnosis present

## 2018-07-11 DIAGNOSIS — R58 Hemorrhage, not elsewhere classified: Secondary | ICD-10-CM | POA: Diagnosis not present

## 2018-07-11 DIAGNOSIS — M1990 Unspecified osteoarthritis, unspecified site: Secondary | ICD-10-CM | POA: Diagnosis present

## 2018-07-11 DIAGNOSIS — R350 Frequency of micturition: Secondary | ICD-10-CM | POA: Diagnosis present

## 2018-07-11 DIAGNOSIS — E1122 Type 2 diabetes mellitus with diabetic chronic kidney disease: Secondary | ICD-10-CM | POA: Diagnosis present

## 2018-07-11 DIAGNOSIS — S5011XA Contusion of right forearm, initial encounter: Secondary | ICD-10-CM | POA: Diagnosis present

## 2018-07-11 DIAGNOSIS — K644 Residual hemorrhoidal skin tags: Secondary | ICD-10-CM | POA: Diagnosis present

## 2018-07-11 DIAGNOSIS — D539 Nutritional anemia, unspecified: Secondary | ICD-10-CM | POA: Diagnosis not present

## 2018-07-11 DIAGNOSIS — Z6820 Body mass index (BMI) 20.0-20.9, adult: Secondary | ICD-10-CM

## 2018-07-11 DIAGNOSIS — I129 Hypertensive chronic kidney disease with stage 1 through stage 4 chronic kidney disease, or unspecified chronic kidney disease: Secondary | ICD-10-CM | POA: Diagnosis not present

## 2018-07-11 DIAGNOSIS — E1159 Type 2 diabetes mellitus with other circulatory complications: Secondary | ICD-10-CM | POA: Diagnosis present

## 2018-07-11 DIAGNOSIS — E860 Dehydration: Secondary | ICD-10-CM

## 2018-07-11 DIAGNOSIS — S0101XA Laceration without foreign body of scalp, initial encounter: Secondary | ICD-10-CM | POA: Diagnosis present

## 2018-07-11 DIAGNOSIS — S0003XA Contusion of scalp, initial encounter: Secondary | ICD-10-CM | POA: Diagnosis not present

## 2018-07-11 DIAGNOSIS — Y92009 Unspecified place in unspecified non-institutional (private) residence as the place of occurrence of the external cause: Secondary | ICD-10-CM

## 2018-07-11 DIAGNOSIS — Z8673 Personal history of transient ischemic attack (TIA), and cerebral infarction without residual deficits: Secondary | ICD-10-CM

## 2018-07-11 DIAGNOSIS — N184 Chronic kidney disease, stage 4 (severe): Secondary | ICD-10-CM | POA: Diagnosis not present

## 2018-07-11 DIAGNOSIS — Z66 Do not resuscitate: Secondary | ICD-10-CM | POA: Diagnosis present

## 2018-07-11 DIAGNOSIS — I440 Atrioventricular block, first degree: Secondary | ICD-10-CM | POA: Diagnosis not present

## 2018-07-11 DIAGNOSIS — Z743 Need for continuous supervision: Secondary | ICD-10-CM | POA: Diagnosis not present

## 2018-07-11 DIAGNOSIS — F0390 Unspecified dementia without behavioral disturbance: Secondary | ICD-10-CM | POA: Clinically undetermined

## 2018-07-11 DIAGNOSIS — M109 Gout, unspecified: Secondary | ICD-10-CM | POA: Diagnosis present

## 2018-07-11 DIAGNOSIS — E785 Hyperlipidemia, unspecified: Secondary | ICD-10-CM | POA: Diagnosis present

## 2018-07-11 DIAGNOSIS — Z794 Long term (current) use of insulin: Secondary | ICD-10-CM

## 2018-07-11 DIAGNOSIS — Z79899 Other long term (current) drug therapy: Secondary | ICD-10-CM

## 2018-07-11 DIAGNOSIS — Z7901 Long term (current) use of anticoagulants: Secondary | ICD-10-CM | POA: Diagnosis not present

## 2018-07-11 DIAGNOSIS — E119 Type 2 diabetes mellitus without complications: Secondary | ICD-10-CM | POA: Diagnosis not present

## 2018-07-11 DIAGNOSIS — Z86718 Personal history of other venous thrombosis and embolism: Secondary | ICD-10-CM | POA: Diagnosis not present

## 2018-07-11 DIAGNOSIS — R404 Transient alteration of awareness: Secondary | ICD-10-CM | POA: Diagnosis not present

## 2018-07-11 DIAGNOSIS — M1611 Unilateral primary osteoarthritis, right hip: Secondary | ICD-10-CM | POA: Diagnosis not present

## 2018-07-11 DIAGNOSIS — Z741 Need for assistance with personal care: Secondary | ICD-10-CM | POA: Diagnosis not present

## 2018-07-11 DIAGNOSIS — R279 Unspecified lack of coordination: Secondary | ICD-10-CM | POA: Diagnosis not present

## 2018-07-11 DIAGNOSIS — R4182 Altered mental status, unspecified: Secondary | ICD-10-CM | POA: Diagnosis not present

## 2018-07-11 DIAGNOSIS — E559 Vitamin D deficiency, unspecified: Secondary | ICD-10-CM | POA: Diagnosis not present

## 2018-07-11 DIAGNOSIS — Z8249 Family history of ischemic heart disease and other diseases of the circulatory system: Secondary | ICD-10-CM

## 2018-07-11 DIAGNOSIS — I340 Nonrheumatic mitral (valve) insufficiency: Secondary | ICD-10-CM | POA: Diagnosis present

## 2018-07-11 DIAGNOSIS — R791 Abnormal coagulation profile: Secondary | ICD-10-CM

## 2018-07-11 DIAGNOSIS — I447 Left bundle-branch block, unspecified: Secondary | ICD-10-CM

## 2018-07-11 DIAGNOSIS — R0902 Hypoxemia: Secondary | ICD-10-CM | POA: Diagnosis not present

## 2018-07-11 DIAGNOSIS — I10 Essential (primary) hypertension: Secondary | ICD-10-CM | POA: Diagnosis not present

## 2018-07-11 DIAGNOSIS — I110 Hypertensive heart disease with heart failure: Secondary | ICD-10-CM | POA: Diagnosis not present

## 2018-07-11 DIAGNOSIS — W19XXXA Unspecified fall, initial encounter: Secondary | ICD-10-CM | POA: Diagnosis not present

## 2018-07-11 DIAGNOSIS — D631 Anemia in chronic kidney disease: Secondary | ICD-10-CM | POA: Diagnosis not present

## 2018-07-11 DIAGNOSIS — R55 Syncope and collapse: Secondary | ICD-10-CM | POA: Diagnosis not present

## 2018-07-11 DIAGNOSIS — M6281 Muscle weakness (generalized): Secondary | ICD-10-CM | POA: Diagnosis not present

## 2018-07-11 DIAGNOSIS — E1165 Type 2 diabetes mellitus with hyperglycemia: Secondary | ICD-10-CM | POA: Diagnosis not present

## 2018-07-11 DIAGNOSIS — H409 Unspecified glaucoma: Secondary | ICD-10-CM | POA: Diagnosis not present

## 2018-07-11 LAB — CBC
HCT: 30.5 % — ABNORMAL LOW (ref 36.0–46.0)
Hemoglobin: 9.6 g/dL — ABNORMAL LOW (ref 12.0–15.0)
MCH: 30.2 pg (ref 26.0–34.0)
MCHC: 31.5 g/dL (ref 30.0–36.0)
MCV: 95.9 fL (ref 80.0–100.0)
PLATELETS: 247 10*3/uL (ref 150–400)
RBC: 3.18 MIL/uL — ABNORMAL LOW (ref 3.87–5.11)
RDW: 13.2 % (ref 11.5–15.5)
WBC: 8.2 10*3/uL (ref 4.0–10.5)
nRBC: 0 % (ref 0.0–0.2)

## 2018-07-11 LAB — BASIC METABOLIC PANEL
Anion gap: 13 (ref 5–15)
BUN: 43 mg/dL — ABNORMAL HIGH (ref 8–23)
CO2: 25 mmol/L (ref 22–32)
CREATININE: 2.27 mg/dL — AB (ref 0.44–1.00)
Calcium: 9.5 mg/dL (ref 8.9–10.3)
Chloride: 101 mmol/L (ref 98–111)
GFR calc Af Amer: 22 mL/min — ABNORMAL LOW (ref >60)
GFR, EST NON AFRICAN AMERICAN: 19 mL/min — AB (ref >60)
Glucose, Bld: 195 mg/dL — ABNORMAL HIGH (ref 70–99)
Potassium: 3.9 mmol/L (ref 3.5–5.1)
Sodium: 139 mmol/L (ref 135–145)

## 2018-07-11 LAB — GLUCOSE, CAPILLARY
Glucose-Capillary: 237 mg/dL — ABNORMAL HIGH (ref 70–99)
Glucose-Capillary: 216 mg/dL — ABNORMAL HIGH (ref 70–99)

## 2018-07-11 LAB — TROPONIN I
Troponin I: <0.03 ng/mL (ref <0.03)
Troponin I: <0.03 ng/mL (ref <0.03)
Troponin I: <0.03 ng/mL (ref <0.03)

## 2018-07-11 LAB — CBG MONITORING, ED: Glucose-Capillary: 166 mg/dL — ABNORMAL HIGH (ref 70–99)

## 2018-07-11 LAB — CK: Total CK: 109 U/L (ref 38–234)

## 2018-07-11 LAB — HEMOGLOBIN A1C
Hgb A1c MFr Bld: 6.7 % — ABNORMAL HIGH (ref 4.8–5.6)
Mean Plasma Glucose: 145.59 mg/dL

## 2018-07-11 LAB — PROTIME-INR
INR: 1.57
Prothrombin Time: 18.6 seconds — ABNORMAL HIGH (ref 11.4–15.2)

## 2018-07-11 MED ORDER — AMLODIPINE BESYLATE 5 MG PO TABS
5.0000 mg | ORAL_TABLET | Freq: Every day | ORAL | Status: DC
  Administered 2018-07-11 – 2018-07-13 (×3): 5 mg via ORAL
  Filled 2018-07-11 (×3): qty 1

## 2018-07-11 MED ORDER — WITCH HAZEL-GLYCERIN EX PADS
MEDICATED_PAD | CUTANEOUS | Status: DC | PRN
  Filled 2018-07-11: qty 100

## 2018-07-11 MED ORDER — SODIUM CHLORIDE 0.9 % IV BOLUS
500.0000 mL | Freq: Once | INTRAVENOUS | Status: AC
  Administered 2018-07-11: 500 mL via INTRAVENOUS

## 2018-07-11 MED ORDER — DORZOLAMIDE HCL-TIMOLOL MAL 2-0.5 % OP SOLN
1.0000 [drp] | Freq: Two times a day (BID) | OPHTHALMIC | Status: DC
  Administered 2018-07-11 – 2018-07-17 (×11): 1 [drp] via OPHTHALMIC
  Filled 2018-07-11: qty 10

## 2018-07-11 MED ORDER — CLONIDINE HCL 0.2 MG/24HR TD PTWK
0.2000 mg | MEDICATED_PATCH | TRANSDERMAL | Status: DC
  Administered 2018-07-13: 0.2 mg via TRANSDERMAL
  Filled 2018-07-11: qty 1

## 2018-07-11 MED ORDER — WARFARIN - PHYSICIAN DOSING INPATIENT
Freq: Every day | Status: DC
  Administered 2018-07-11 – 2018-07-17 (×4)

## 2018-07-11 MED ORDER — ACETAMINOPHEN 500 MG PO TABS
1000.0000 mg | ORAL_TABLET | Freq: Four times a day (QID) | ORAL | Status: DC | PRN
  Administered 2018-07-15: 1000 mg via ORAL
  Filled 2018-07-11: qty 2

## 2018-07-11 MED ORDER — WARFARIN SODIUM 7.5 MG PO TABS: 7.5000 mg | ORAL_TABLET | ORAL | Status: DC

## 2018-07-11 MED ORDER — ALLOPURINOL 100 MG PO TABS
100.0000 mg | ORAL_TABLET | Freq: Every day | ORAL | Status: DC
  Administered 2018-07-11 – 2018-07-17 (×7): 100 mg via ORAL
  Filled 2018-07-11 (×7): qty 1

## 2018-07-11 MED ORDER — WARFARIN SODIUM 5 MG PO TABS: 5.0000 mg | ORAL_TABLET | ORAL | Status: DC

## 2018-07-11 MED ORDER — WARFARIN SODIUM 5 MG PO TABS
5.0000 mg | ORAL_TABLET | ORAL | Status: AC
  Administered 2018-07-11: 5 mg via ORAL
  Filled 2018-07-11: qty 1

## 2018-07-11 MED ORDER — LATANOPROST 0.005 % OP SOLN
1.0000 [drp] | Freq: Every day | OPHTHALMIC | Status: DC
  Administered 2018-07-11 – 2018-07-16 (×6): 1 [drp] via OPHTHALMIC
  Filled 2018-07-11: qty 2.5

## 2018-07-11 NOTE — ED Notes (Signed)
PT is resting at this time- family at bedside- reports no complaints

## 2018-07-11 NOTE — ED Notes (Signed)
Report given to floor nurse

## 2018-07-11 NOTE — ED Notes (Signed)
Regular diet breakfast tray ordered. S/w Geni Bers in [facility].

## 2018-07-11 NOTE — Progress Notes (Signed)
PHARMACIST - PHYSICIAN COMMUNICATION DR:  Graciella Freer CONCERNING: Pharmacy Care Issues Regarding Warfarin Labs  RECOMMENDATION (Action Taken): A baseline and daily protime for three days has been ordered to meet the [facility] National Patient safety goal and comply with the current [place].   The Pharmacy will defer all warfarin dose order changes and follow up of lab results to the prescriber unless an additional order to initiate a "pharmacy Coumadin consult" is placed.  DESCRIPTION:  While hospitalized, to be in compliance with [facility] [place] Patient Safety Goals, all patients on warfarin must have a baseline and/or current protime prior to the administration of warfarin. Pharmacy has received your order for warfarin without these required laboratory assessments.  Alycia Rossetti, PharmD, BCPS Pager: [phone] 5:22 PM

## 2018-07-11 NOTE — ED Provider Notes (Signed)
[facility] EMERGENCY DEPARTMENT Provider Note   CSN: [id number] Arrival date & time: 07/11/18  0121     History   Chief Complaint Chief Complaint  Patient presents with  . Fall    HPI Chelsea Mcdaniel is a 84 y.o. female.   84 year old female with history of dyslipidemia, hypertension, diabetes, CKD, CVA, DVT on chronic coumadin presents to the emergency department following an unwitnessed fall.  Patient lives at home alone.  She reportedly pushed her life alert button which prompted EMS response.  Patient cannot recall how she fell or how long she was on the floor.  She was found on the floor with a scalp laceration.  She has no complaints of pain.  Specifically, she denies headache, chest pain, abdominal pain, back or neck pain.  No extremity numbness, paresthesias, weakness, nausea, vomiting.  Usually uses a cane or walker when ambulatory.     Past Medical History:  Diagnosis Date  . Anemia     normocytic anemia with baseline hemoglobin 10-11  . Blindness of left eye     likely related to stroke, left cataract removed from that eye with complications  . Calculus gallbladder and bile duct with cholecystitis with obstruction 01/2015  . Chronic kidney disease 2006    left renal artery stenosis with probable hemodynamic significance, kidneys are normal in morphology without focal lesions or hydronephrosis this is based but cannot A. of the abdomen with and without contrast done on the 31st 2006  . CKD (chronic kidney disease), stage III ([place])   . CVA (cerebral vascular accident) ([place]) 1990's  . CVA (cerebrovascular accident) [facility])  October 2007    CT of the head atrophy with multiple remote insults noted but no definite acute findings  . Glaucoma   . Gout   . Hyperlipidemia   . Hypertension   . Personal history of DVT (deep vein thrombosis) 08/11/2010   BLE  . Type II diabetes mellitus [facility])     Patient Active Problem List   Diagnosis Date Noted  .  Malaise and fatigue 05/02/2018  . Weight loss 02/28/2018  . Ankle edema, bilateral 04/18/2017  . Senile purpura ([place]) 12/18/2016  . Immunization not carried out because of parent refusal 12/18/2016  . Advance care planning 02/18/2016  . Fatigue 10/20/2015  . Unequal blood pressure in upper extremities 10/20/2015  . Choledocholithiasis 02/11/2015  . Constipation 01/01/2015  . Cervical radiculopathy 12/10/2014  . Left knee pain 08/24/2014  . Vitamin D insufficiency 08/24/2014  . Carpal tunnel syndrome 01/07/2014  . 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 Healthcare maintenance 08/15/2013  . CKD (chronic kidney disease) stage 4, GFR 15-29 ml/min (HCC) 02/16/2012  . Osteoarthritis of hand 02/16/2012  . Long-term (current) use of anticoagulants 08/11/2010  . Type 2 diabetes mellitus ([place]) 12/12/2006  . Hyperlipidemia 12/12/2006  . Anemia of chronic disease 12/12/2006  . Hypertension associated with diabetes ([place]) 12/12/2006  . Late effect of cerebrovascular accident (CVA) 09/19/2006    Past Surgical History:  Procedure Laterality Date  . ABDOMINAL HYSTERECTOMY    . CATARACT EXTRACTION Left   . TRANSTHORACIC ECHOCARDIOGRAM   May 2008    left ventricular systolic function normal EF estimated range of 55-60%, no definite diagnostic evidence of left ventricular regional wall motion abnormalities, Doppler parameters consistent with abnormal left ventricular relaxation, findings suggestive of possible bicuspid aortic valve and aortic valve thickness mildly increase     OB History   No obstetric  history on file.      Home Medications    Prior to Admission medications   Medication Sig Start Date End Date Taking? Authorizing Provider  allopurinol (ZYLOPRIM) 100 MG tablet Take 1 tablet (100 mg total) by mouth daily. 06/12/18 09/10/18 Yes Chundi, [name], MD  amLODipine (NORVASC) 5 MG tablet Take 5 mg by mouth daily. 06/20/18  Yes [provider]    cloNIDine (CATAPRES - DOSED IN MG/24 HR) 0.2 mg/24hr patch Place 1 patch (0.2 mg total) onto the skin once a week. 02/28/18  Yes Chundi, [name], MD  dorzolamide-timolol (COSOPT) 22.3-6.8 MG/ML ophthalmic solution Place 1 drop into both eyes 2 (two) times daily. 05/16/18  Yes [provider]  furosemide (LASIX) 10 MG/ML solution Take 8 mLs (80 mg total) by mouth daily. 09/06/17  Yes Chundi, [name], MD  JANUVIA 25 MG tablet TAKE 1 TABLET BY MOUTH EVERY DAY Patient taking differently: Take 25 mg by mouth daily.  06/12/18  Yes Chundi, [name], MD  LUMIGAN 0.01 % SOLN Place 1 drop into both eyes at bedtime.  12/05/13  Yes [provider]  polyethylene glycol (MIRALAX) packet Take 17 g by mouth 2 (two) times daily. 03/25/17  Yes [name], MD  warfarin (COUMADIN) 5 MG tablet TAKE 1 TABLET BY MOUTH EVERY DAY AT 6 PM EXCEPT WEDNESDAY AND FRIDAY TAKE 1 & 1/2 TABLETS ON THOSE DAYS Patient taking differently: Take 5-7.5 mg by mouth See admin instructions. TAKE 1 TABLET BY MOUTH EVERY DAY AT 6 PM EXCEPT [facility] AND FRIDAY TAKE 1 & 1/2 TABLETS ON THOSE DAYS 07/10/18  Yes Chundi, [name], MD  ACCU-CHEK SOFTCLIX LANCETS lancets Used to check blood sugar 3 times daily. Dx code E11.8 06/08/17   [name], MD  Blood Glucose Monitoring Suppl (ACCU-CHEK AVIVA PLUS) w/Device KIT Check blood sugar 1 time a day 03/08/18   Chundi, [name], MD  glucose blood (ACCU-CHEK AVIVA PLUS) test strip USE 1 TIME DAILY TO CHECK BLOOD SUGAR 09/03/17   Chundi, [name], MD  glucose blood (ACCU-CHEK AVIVA) test strip Use as instructed 05/03/11 09/03/18  Othella Boyer, MD  hydrALAZINE (APRESOLINE) 100 MG tablet Take 1 tablet (100 mg total) by mouth 3 (three) times daily. Patient not taking: Reported on 07/11/2018 02/28/18 07/11/26  [name], MD  Lancets Misc. (ACCU-CHEK SOFTCLIX LANCET DEV) KIT Use to check blood sugar one time a day 09/03/17   [name], MD  warfarin (COUMADIN) 5 MG tablet TAKE 1 TABLET BY  MOUTH EVERY DAY AT 6 pm EXCEPT TAKE 1/2 TABLET ON monday AND thursday Patient not taking: Reported on 07/11/2018 01/23/18   [name], MD    Family History Family History  Problem Relation Age of Onset  . Heart disease Mother   . Diabetes Mother   . Hyperlipidemia Mother   . Hypertension Mother   . Heart disease Father   . Diabetes Father   . Hyperlipidemia Father   . Hypertension Father     Social History Social History   Tobacco Use  . Smoking status: Never Smoker  . Smokeless tobacco: Never Used  Substance Use Topics  . Alcohol use: No    Alcohol/week: 0.0 standard drinks  . Drug use: No     Allergies   Diltiazem and Metoprolol   Review of Systems Review of Systems Ten systems reviewed and are negative for acute change, except as noted in the HPI.    Physical Exam Updated Vital Signs BP (!) 179/70 (BP Location: Right Arm)  Pulse 86   Temp 97.6 F (36.4 C) (Oral)   Resp 16   Ht _0  (1.626 m)   Wt 53.5 kg   SpO2 95%   BMI 20.25 kg/m   Physical Exam Vitals signs and nursing note reviewed.  Constitutional:      General: She is not in acute distress.    Appearance: She is well-developed. She is not diaphoretic.     Comments: Thin and frail appearing, in NAD  HENT:     Head: Normocephalic.     Comments: Bandage wrapped around head. When unwrapped there is a hematoma to left posterior parietal scalp with macerated skin. No active bleeding. No injury appropriate for suturing. No evidence of active bleeding. Eyes:     General: No scleral icterus.    Conjunctiva/sclera: Conjunctivae normal.  Neck:     Comments: C-collar in place Cardiovascular:     Rate and Rhythm: Normal rate and regular rhythm.     Pulses: Normal pulses.  Pulmonary:     Effort: Pulmonary effort is normal. No respiratory distress.     Comments: Respirations even and unlabored Musculoskeletal: Normal range of motion.  Skin:    General: Skin is warm and dry.     Coloration:  Skin is not pale.     Findings: No erythema or rash.  Neurological:     Mental Status: She is alert and oriented to person, place, and time.     Comments: GCS 15. Speech is goal oriented. No cranial nerve deficits appreciated; symmetric eyebrow raise, no facial drooping, tongue midline. Patient has equal grip strength bilaterally with 5/5 strength against resistance in all major muscle groups bilaterally. Sensation to light touch intact. Patient moves extremities without ataxia. No pronator drift.  Psychiatric:        Behavior: Behavior normal.      ED Treatments / Results  Labs (all labs ordered are listed, but only abnormal results are displayed) Labs Reviewed  PROTIME-INR - Abnormal; Notable for the following components:      Result Value   Prothrombin Time 18.6 (*)    All other components within normal limits  CBC - Abnormal; Notable for the following components:   RBC 3.18 (*)    Hemoglobin 9.6 (*)    HCT 30.5 (*)    All other components within normal limits  BASIC METABOLIC PANEL - Abnormal; Notable for the following components:   Glucose, Bld 195 (*)    BUN 43 (*)    Creatinine, Ser 2.27 (*)    GFR calc non Af Amer 19 (*)    GFR calc Af Amer 22 (*)    All other components within normal limits  CBG MONITORING, ED - Abnormal; Notable for the following components:   Glucose-Capillary 166 (*)    All other components within normal limits  CK    EKG EKG Interpretation  Date/Time:  Tuesday July 11 2018 01:56:10 EST Ventricular Rate:  77 PR Interval:  282 QRS Duration: 136 QT Interval:  416 QTC Calculation: 470 R Axis:   -57 Text Interpretation:  Sinus rhythm with 1st degree A-V block Left axis deviation Left bundle branch block Abnormal ECG LBBB new from previous. similar degree of AV block Confirmed by Merrily Pew [phone]) on 07/11/2018 6:08:07 AM   Radiology Ct Head Wo Contrast  Result Date: 07/11/2018 CLINICAL DATA:  Found down with head laceration.  History of stroke, hypertension, hyperlipidemia and diabetes. EXAM: CT HEAD WITHOUT CONTRAST CT CERVICAL SPINE WITHOUT CONTRAST TECHNIQUE:  Multidetector CT imaging of the head and cervical spine was performed following the standard protocol without intravenous contrast. Multiplanar CT image reconstructions of the cervical spine were also generated. COMPARISON:  CT HEAD February 01, 2016 FINDINGS: CT HEAD FINDINGS BRAIN: No intraparenchymal hemorrhage, mass effect nor midline shift. RIGHT frontoparietal and occipital encephalomalacia unchanged. Ex vacuo dilatation subjacent RIGHT ventricle. Old cystic LEFT basal ganglia infarcts. Small area LEFT temporal encephalomalacia. Confluent supratentorial white matter hypodensities. No acute large vascular territory infarcts. Moderate parenchymal brain volume loss. No hydrocephalus. No abnormal extra-axial fluid collections. VASCULAR: Moderate calcific atherosclerosis of the carotid siphons. SKULL: No skull fracture. Small LEFT parietal scalp hematoma with minimal subcutaneous gas. SINUSES/ORBITS: Trace paranasal sinus mucosal thickening. Mastoid air cells are well aerated.The included ocular globes and orbital contents are non-suspicious. Status post LEFT ocular lens implant. OTHER: None. CT CERVICAL SPINE FINDINGS ALIGNMENT: Reversed lordosis.  Vertebral bodies in alignment. SKULL BASE AND VERTEBRAE: Cervical vertebral bodies and posterior elements are intact. Severe C3-4 and C5-6 disc height loss with endplate spurring compatible with degenerative discs. Multilevel disc mineralization. C1-2 articulation maintained with severe calcified pannus about the odontoid process seen with CPPD resulting in moderate canal stenosis. Calcified nuchal ligaments and ligamentum flavum. Osteopenia without destructive bony lesions. SOFT TISSUES AND SPINAL CANAL: Nonacute. Surgical clips RIGHT neck most compatible with carotid endarterectomy. Severe calcific atherosclerosis LEFT carotid  bifurcation could result in hemodynamically significant stenosis. DISC LEVELS: Severe RIGHT C2-3, bilateral C3-4, moderate C5-6 and moderate RIGHT C7-T1 neural foraminal narrowing. UPPER CHEST: Lung apices are clear. OTHER: None. IMPRESSION: CT HEAD: 1. LEFT scalp hematoma.  No acute intracranial process. 2. Old RIGHT frontoparietal and occipital lobe infarcts. Old LEFT basal ganglia and LEFT temporal infarcts. 3. Moderate to severe chronic small vessel ischemic changes. CT CERVICAL SPINE: 1. No fracture or malalignment. 2. Extensive calcified pannus about the odontoid process seen with CPPD resulting in moderate canal stenosis. 3. Severe C2-3 and C3-4 neural foraminal narrowing. Electronically Signed   By: Elon Alas M.D.   On: 07/11/2018 05:18   Ct Cervical Spine Wo Contrast  Result Date: 07/11/2018 CLINICAL DATA:  Found down with head laceration. History of stroke, hypertension, hyperlipidemia and diabetes. EXAM: CT HEAD WITHOUT CONTRAST CT CERVICAL SPINE WITHOUT CONTRAST TECHNIQUE: Multidetector CT imaging of the head and cervical spine was performed following the standard protocol without intravenous contrast. Multiplanar CT image reconstructions of the cervical spine were also generated. COMPARISON:  CT HEAD February 01, 2016 FINDINGS: CT HEAD FINDINGS BRAIN: No intraparenchymal hemorrhage, mass effect nor midline shift. RIGHT frontoparietal and occipital encephalomalacia unchanged. Ex vacuo dilatation subjacent RIGHT ventricle. Old cystic LEFT basal ganglia infarcts. Small area LEFT temporal encephalomalacia. Confluent supratentorial white matter hypodensities. No acute large vascular territory infarcts. Moderate parenchymal brain volume loss. No hydrocephalus. No abnormal extra-axial fluid collections. VASCULAR: Moderate calcific atherosclerosis of the carotid siphons. SKULL: No skull fracture. Small LEFT parietal scalp hematoma with minimal subcutaneous gas. SINUSES/ORBITS: Trace paranasal sinus  mucosal thickening. Mastoid air cells are well aerated.The included ocular globes and orbital contents are non-suspicious. Status post LEFT ocular lens implant. OTHER: None. CT CERVICAL SPINE FINDINGS ALIGNMENT: Reversed lordosis.  Vertebral bodies in alignment. SKULL BASE AND VERTEBRAE: Cervical vertebral bodies and posterior elements are intact. Severe C3-4 and C5-6 disc height loss with endplate spurring compatible with degenerative discs. Multilevel disc mineralization. C1-2 articulation maintained with severe calcified pannus about the odontoid process seen with CPPD resulting in moderate canal stenosis. Calcified nuchal ligaments and ligamentum flavum. Osteopenia without  destructive bony lesions. SOFT TISSUES AND SPINAL CANAL: Nonacute. Surgical clips RIGHT neck most compatible with carotid endarterectomy. Severe calcific atherosclerosis LEFT carotid bifurcation could result in hemodynamically significant stenosis. DISC LEVELS: Severe RIGHT C2-3, bilateral C3-4, moderate C5-6 and moderate RIGHT C7-T1 neural foraminal narrowing. UPPER CHEST: Lung apices are clear. OTHER: None. IMPRESSION: CT HEAD: 1. LEFT scalp hematoma.  No acute intracranial process. 2. Old RIGHT frontoparietal and occipital lobe infarcts. Old LEFT basal ganglia and LEFT temporal infarcts. 3. Moderate to severe chronic small vessel ischemic changes. CT CERVICAL SPINE: 1. No fracture or malalignment. 2. Extensive calcified pannus about the odontoid process seen with CPPD resulting in moderate canal stenosis. 3. Severe C2-3 and C3-4 neural foraminal narrowing. Electronically Signed   By: Elon Alas M.D.   On: 07/11/2018 05:18    Procedures Procedures (including critical care time)  Medications Ordered in ED Medications  sodium chloride 0.9 % bolus 500 mL (has no administration in time range)     Initial Impression / Assessment and Plan / ED Course  I have reviewed the triage vital signs and the nursing notes.  Pertinent  labs & imaging results that were available during my care of the patient were reviewed by me and considered in my medical decision making (see chart for details).     84 year old female presents to the emergency department after an unwitnessed fall.  She lives at home alone.  Is on Coumadin given prior history of DVT.  Patient is unable to recall the events precipitating her fall tonight.  She has no complaints of pain.  EKG shows new left bundle branch block.  She denies chest pain.  A troponin is pending.  Remainder of her laboratory work-up is reassuring aside from her subtherapeutic INR.  She had CT scans of her head and cervical spine which showed no evidence of acute traumatic injury.  Patient attempted ambulation in the ED, but is very unsteady on her feet.  Do not believe it is safe for her to be discharged in the state.  She complains of some lightheadedness with ambulation.  Will hydrate.  Plan for admission to IM teaching service for observation as well as PT/OT eval.     Final Clinical Impressions(s) / ED Diagnoses   Final diagnoses:  Fall, initial encounter  Dehydration  Subtherapeutic international normalized ratio (INR)  Left bundle branch block    ED Discharge Orders    None       Antonietta Breach, PA-C 07/11/18 [id number]    Merrily Pew, MD 07/11/18 [phone]

## 2018-07-11 NOTE — ED Notes (Signed)
Pt to echo.

## 2018-07-11 NOTE — ED Provider Notes (Signed)
Medical screening examination/treatment/procedure(s) were conducted as a shared visit with non-physician practitioner(s) and myself.  I personally evaluated the patient during the encounter.  Fall of unclear origin who is without pain on my exam who presents to ed after fall with some bleeding on posterior scalp. No other pain or tenderness.  Imaging negative.   Labs with subtherapeutic INR, elevated BUN c/w likely dehydration. Will give fluids. reeval.   EKG Interpretation  Date/Time:  Tuesday July 11 2018 01:56:10 EST Ventricular Rate:  77 PR Interval:  282 QRS Duration: 136 QT Interval:  416 QTC Calculation: 470 R Axis:   -57 Text Interpretation:  Sinus rhythm with 1st degree A-V block Left axis deviation Left bundle branch block Abnormal ECG LBBB new from previous. similar degree of AV block Confirmed by Merrily Pew [phone]) on 07/11/2018 6:08:07 AM      Miliano Cotten, Corene Cornea, MD 07/11/18 [phone]

## 2018-07-11 NOTE — Evaluation (Addendum)
Physical Therapy Evaluation Patient Details Name: Chelsea Mcdaniel MRN: [id number] DOB: September 20, 1927 Today's Date: 07/11/2018   History of Present Illness  Chelsea Mcdaniel is a pleasant 84 year old woman with hypertension, controlled type 2 diabetes mellitus not requiring insulin, CKD 4, anemia of ckd, CVA, remote hx of unprovoked DVT x 2 ( on coumadin).  She is seen in the ED after presenting for a fall.  She does not remember the events from earlier today so this history was obtained from chart review and ED provider report.  EMS arrived at her home this morning after receiving alert that she had activated her life alert alarm.  She was found on the floor in her home.  Clinical Impression  Pt admitted with above diagnosis. Pt currently with functional limitations due to the deficits listed below (see PT Problem List). Pt was able to ambulate a short distance with min guard assist with RW (2nd person for lines only).  Pt uses rollator at home and usually Modif I.  Pt needed assist today and could not sit down on low chair due to coccyx pain.  Do not feel that pt would be able to sit on rollator seat to rest if needed at home.  Pt was able to sit on stretcher which is higher at end of treatment but still slow and painful.  MD:  Pt was not weight bearing fully on right hip but also complaining of coccyx pain with sit to stand to sit. Pt normally has aide 2 hours day and Chelsea Mcdaniel and Chelsea Mcdaniel check on pt throughout day.  Several options are possible for pt.  REcommend short term SNF as ultimate d/c plan to get stronger prior to d/c home.  If pt does not qualify for whatever reason, would pt qualify for Home First program due to being Chelsea Mcdaniel pt.  If so, possibly incr services at home with this program with HHAide, HHPT and [place] and family support.  Chelsea Mcdaniel also interested in information about Meals on Wheels for pt as pt does not drive and is homebound. Will follow acutely.    Pt will benefit from skilled PT to increase their  independence and safety with mobility to allow discharge to the venue listed below.      Follow Up Recommendations SNF;Supervision/Assistance - 24 hour(if no 24 hour care, SNF may be needed)    Equipment Recommendations  None recommended by PT    Recommendations for Other Services       Precautions / Restrictions Precautions Precautions: Fall Restrictions Weight Bearing Restrictions: No      Mobility  Bed Mobility Overal bed mobility: Needs Assistance Bed Mobility: Supine to Sit;Sit to Supine     Supine to sit: Min guard Sit to supine: Min guard   General bed mobility comments: Incr time but pt was able to get on and off stretcher with min guard assist.   Transfers Overall transfer level: Needs assistance Equipment used: Rolling walker (2 wheeled) Transfers: Sit to/from Stand Sit to Stand: Min assist;+2 safety/equipment         General transfer comment: Pt needed a little assist initially due to right LE pain with weight bearing and coccyx pain per pt.  Took incr time for pt to get balance on feet and get steady.    Ambulation/Gait Ambulation/Gait assistance: Min guard;Min assist;+2 safety/equipment Gait Distance (Feet): 30 Feet Assistive device: Rolling walker (2 wheeled) Gait Pattern/deviations: Decreased step length - right;Decreased step length - left;Decreased stance time - right;Decreased weight shift to right;Step-to  pattern;Decreased stride length;Antalgic;Leaning posteriorly;Drifts right/left;Trunk flexed   Gait velocity interpretation: <1.31 ft/sec, indicative of household ambulator General Gait Details: Pt was able to ambulate a short distance with RW with min assist for safety as she has pain in buttocks with ambulation.  Pt walked 15 feet to a chair and PT was going to allow her to rest however pt could not sit down to low chair due to each time she tried to sit, her buttocks was too sore.  Pt eneded up walking back over to stretcher and was able to sit on  it because it was a taller height.    Stairs            Wheelchair Mobility    Modified Rankin (Stroke Patients Only)       Balance Overall balance assessment: Needs assistance;History of Falls Sitting-balance support: No upper extremity supported;Feet supported Sitting balance-Leahy Scale: Fair     Standing balance support: Bilateral upper extremity supported;During functional activity Standing balance-Leahy Scale: Poor Standing balance comment: relies on UE support on RW and min guard external support for balance.               High level balance activites: Direction changes;Backward walking;Turns High Level Balance Comments: min guard assist with RW.              Pertinent Vitals/Pain Pain Assessment: Faces Faces Pain Scale: Hurts whole lot Pain Location: coccyx Pain Descriptors / Indicators: Aching;Grimacing;Guarding Pain Intervention(s): Limited activity within patient's tolerance;Monitored during session;Repositioned    Home Living Family/patient expects to be discharged to:: Private residence Living Arrangements: Alone;Non-relatives/Friends;Children Available Help at Discharge: Family;Available PRN/intermittently(Aide 2 hours day, M-Sun; children prn (dgtr and Chelsea Mcdaniel)) Type of Home: House Home Access: Level entry     Home Layout: One level Home Equipment: Walker - 4 wheels;Cane - single point;Toilet riser;Shower seat;Hand held shower head;Grab bars - tub/shower      Prior Function Level of Independence: Independent with assistive device(s);Needs assistance   Gait / Transfers Assistance Needed: rollator in home  ADL's / Homemaking Assistance Needed: Aide assist with bathing and dressing.  Daugther and Chelsea Mcdaniel always made sure pt had meals.    Comments: Chelsea Mcdaniel states prior to this, pt was able to ambulate all over house on her own with rollator.       Hand Dominance        Extremity/Trunk Assessment   Upper Extremity Assessment Upper Extremity  Assessment: Defer to OT evaluation    Lower Extremity Assessment Lower Extremity Assessment: Generalized weakness;RLE deficits/detail RLE Deficits / Details: grossly 3/5    Cervical / Trunk Assessment Cervical / Trunk Assessment: Kyphotic  Communication   Communication: No difficulties  Cognition Arousal/Alertness: Awake/alert Behavior During Therapy: Flat affect Overall Cognitive Status: Impaired/Different from baseline Area of Impairment: Orientation;Following commands;Safety/judgement;Awareness;Problem solving                 Orientation Level: Disoriented to;Situation;Time     Following Commands: Follows one step commands consistently Safety/Judgement: Decreased awareness of safety   Problem Solving: Difficulty sequencing;Requires verbal cues General Comments: has had periods of confusion per chart      General Comments      Exercises     Assessment/Plan    PT Assessment Patient needs continued PT services  PT Problem List Pain;Decreased safety awareness;Decreased knowledge of precautions;Decreased knowledge of use of DME;Decreased cognition;Decreased mobility;Decreased balance;Decreased activity tolerance       PT Treatment Interventions DME instruction;Gait training;Functional mobility training;Therapeutic activities;Therapeutic exercise;Balance training;Patient/family education  PT Goals (Current goals can be found in the Care Plan section)  Acute Rehab PT Goals Patient Stated Goal: to go home PT Goal Formulation: With patient Time For Goal Achievement: 07/25/18 Potential to Achieve Goals: Good    Frequency Min 3X/week   Barriers to discharge Decreased caregiver support(currently pt alone some parts of day)      Co-evaluation               AM-PAC PT "6 Clicks" Mobility  Outcome Measure Help needed turning from your back to your side while in a flat bed without using bedrails?: None Help needed moving from lying on your back to sitting  on the side of a flat bed without using bedrails?: None Help needed moving to and from a bed to a chair (including a wheelchair)?: A Little Help needed standing up from a chair using your arms (e.g., wheelchair or bedside chair)?: A Little Help needed to walk in hospital room?: A Little Help needed climbing 3-5 steps with a railing? : A Lot 6 Click Score: 19    End of Session Equipment Utilized During Treatment: Gait belt Activity Tolerance: Patient limited by fatigue;Patient limited by pain Patient left: in bed;with call bell/phone within reach;with family/visitor present Nurse Communication: Mobility status PT Visit Diagnosis: Unsteadiness on feet (R26.81);Muscle weakness (generalized) (M62.81);Pain Pain - Right/Left: Right Pain - part of body: Hip(coccyx)    Time: 1329-1405 PT Time Calculation (min) (ACUTE ONLY): 36 min   Charges:   PT Evaluation $PT Eval Moderate Complexity: 1 Mod PT Treatments $Gait Training: 8-22 mins        Jim [place] Pager:  [phone]  Office:  [place] 07/11/2018, 2:22 PM

## 2018-07-11 NOTE — ED Notes (Signed)
Pt ambulated with 2 person assist using walker. Unsteady gait

## 2018-07-11 NOTE — ED Notes (Signed)
Pt incontinent of stool and urine. Pt ambulated with one assist to restroom to be cleaned up.

## 2018-07-11 NOTE — ED Notes (Signed)
Regular diet lunch tray ordered. S/w Ana in SRC @ 1232.  

## 2018-07-11 NOTE — Progress Notes (Signed)
  Echocardiogram 2D Echocardiogram has been performed.  Chelsea Mcdaniel 07/11/2018, 3:21 PM

## 2018-07-11 NOTE — ED Triage Notes (Addendum)
Pt came by EMS who was found in the floor at home with head laceration. Pt lives alone. Family states pt has Life Alert bracelet and believes pt pressed the button when she fell. Pt is A&O x2, has on c-collar. Pt is on Warfarin. Unknown LOC

## 2018-07-11 NOTE — ED Notes (Signed)
ED Provider at bedside. 

## 2018-07-11 NOTE — Progress Notes (Signed)
New Admission Note:   Arrival Method: Stretcher from ED Mental Orientation: Alert and oriented x 3  Telemetry: Box: 06 Assessment: Completed Skin: Intact IV: LAC NSL Pain: 0 (01 to 10)  Tubes: None Safety Measures: Safety Fall Prevention Plan has been discussed  Admission: Complete  5 [place] Orientation: Patient has been orientated to the room, unit and staff.   Family: At bedside   Orders to be reviewed and implemented. Will continue to monitor the patient. Call light has been placed within reach and bed alarm has been activated.   Baldo Ash, RN

## 2018-07-11 NOTE — ED Notes (Signed)
Will need troponin redraw.

## 2018-07-11 NOTE — H&P (Signed)
Date: 07/11/2018               Patient Name:  Chelsea Mcdaniel MRN: [id number]  DOB: 1927/11/03 Age / Sex: 84 y.o., female   PCP: [name], MD         Medical Service: Internal Medicine Teaching Service         Attending Physician: Dr. Rebeca Alert, Raynaldo Opitz, MD    First Contact: Dr. Eileen Stanford  Pager: [phone]  Second Contact: Dr. Shan Levans Pager: [phone]       After Hours (After 5p/  First Contact Pager: [phone]  weekends / holidays): Second Contact Pager: [phone]   Chief Complaint: life alert call after a fall   History of Present Illness:   Chelsea Mcdaniel is a pleasant 84 year old woman with hypertension, controlled type 2 diabetes mellitus not requiring insulin, CKD 4, anemia of ckd, CVA, remote hx of unprovoked DVT x 2 ( on coumadin).  She is seen in the ED after presenting for a fall.  She does not remember the events from earlier today so this history was obtained from chart review and ED provider report.  EMS arrived at her home this morning after receiving alert that she had activated her life alert alarm.  She was found on the floor in her home.  [name] does not remember the events from earlier today. She does not think that she has had any recent falls.  She is oriented to person but thinks that she is in [place] and does not know the year.  At this moment she is reporting rectal and right hip pain but denies chest pain, shortness of breath, dizziness or palpitations.  She does note that it feels like her heart races when she stands up.  She had a CT scan of the head and C spine which showed old right frontal parietal and occipital, left basal ganglia, left temporal strokes, moderate severe small vessel ischemia, and severe cervical spine foraminal narrowing and moderate canal stenosis at the level of the odontoid.  Meds: Current Meds  Medication Sig  . allopurinol (ZYLOPRIM) 100 MG tablet Take 1 tablet (100 mg total) by mouth daily.  Marland Kitchen amLODipine (NORVASC) 5 MG tablet  Take 5 mg by mouth daily.  . cloNIDine (CATAPRES - DOSED IN MG/24 HR) 0.2 mg/24hr patch Place 1 patch (0.2 mg total) onto the skin once a week.  . dorzolamide-timolol (COSOPT) 22.3-6.8 MG/ML ophthalmic solution Place 1 drop into both eyes 2 (two) times daily.  . furosemide (LASIX) 10 MG/ML solution Take 8 mLs (80 mg total) by mouth daily.  Marland Kitchen JANUVIA 25 MG tablet TAKE 1 TABLET BY MOUTH EVERY DAY (Patient taking differently: Take 25 mg by mouth daily. )  . LUMIGAN 0.01 % SOLN Place 1 drop into both eyes at bedtime.   . polyethylene glycol (MIRALAX) packet Take 17 g by mouth 2 (two) times daily.  Marland Kitchen warfarin (COUMADIN) 5 MG tablet TAKE 1 TABLET BY MOUTH EVERY DAY AT 6 PM EXCEPT WEDNESDAY AND FRIDAY TAKE 1 & 1/2 TABLETS ON THOSE DAYS (Patient taking differently: Take 5-7.5 mg by mouth See admin instructions. TAKE 1 TABLET BY MOUTH EVERY DAY AT 6 PM EXCEPT WEDNESDAY AND FRIDAY TAKE 1 & 1/2 TABLETS ON THOSE DAYS)     Allergies: Allergies as of 07/11/2018 - Review Complete 07/11/2018  Allergen Reaction Noted  . Diltiazem Other (See Comments) 02/06/2016  . Metoprolol Other (See Comments) 02/06/2016   Past Medical History:  Diagnosis Date  .  Anemia     normocytic anemia with baseline hemoglobin 10-11  . Blindness of left eye     likely related to stroke, left cataract removed from that eye with complications  . Calculus gallbladder and bile duct with cholecystitis with obstruction 01/2015  . Chronic kidney disease 2006    left renal artery stenosis with probable hemodynamic significance, kidneys are normal in morphology without focal lesions or hydronephrosis this is based but cannot A. of the abdomen with and without contrast done on the 31st 2006  . CKD (chronic kidney disease), stage III ([place])   . CVA (cerebral vascular accident) ([place]) 1990's  . CVA (cerebrovascular accident) [facility])  October 2007    CT of the head atrophy with multiple remote insults noted but no definite acute findings  .  Glaucoma   . Gout   . Hyperlipidemia   . Hypertension   . Personal history of DVT (deep vein thrombosis) 08/11/2010   BLE  . Type II diabetes mellitus ([facility])     Family History:  Family History  Problem Relation Age of Onset  . Heart disease Mother   . Diabetes Mother   . Hyperlipidemia Mother   . Hypertension Mother   . Heart disease Father   . Diabetes Father   . Hyperlipidemia Father   . Hypertension Father    Social History:  Chelsea Mcdaniel lives on her own in an apartment.  Her daughter-in-law brings her groceries and she help comes into her home to tidy up. Social History   Socioeconomic History  . Marital status: Widowed    Spouse name: Not on file  . Number of children: Not on file  . Years of education: Not on file  . Highest education level: Not on file  Occupational History  . Not on file  Social Needs  . Financial resource strain: Not on file  . Food insecurity:    Worry: Not on file    Inability: Not on file  . Transportation needs:    Medical: Not on file    Non-medical: Not on file  Tobacco Use  . Smoking status: Never Smoker  . Smokeless tobacco: Never Used  Substance and Sexual Activity  . Alcohol use: No    Alcohol/week: 0.0 standard drinks  . Drug use: No  . Sexual activity: Never  Lifestyle  . Physical activity:    Days per week: Not on file    Minutes per session: Not on file  . Stress: Not on file  Relationships  . Social connections:    Talks on phone: Not on file    Gets together: Not on file    Attends religious service: Not on file    Active member of club or organization: Not on file    Attends meetings of clubs or organizations: Not on file    Relationship status: Not on file  . Intimate partner violence:    Fear of current or ex partner: Not on file    Emotionally abused: Not on file    Physically abused: Not on file    Forced sexual activity: Not on file  Other Topics Concern  . Not on file  Social History Narrative     Patient is a widow. She has 11 children 5 of who are living. She is retired in 1993 from [employer]. She denies tobacco alcohol or drug use.    Review of Systems: A complete ROS was negative except as per HPI.   Physical Exam: Blood  pressure (!) 160/71, pulse 91, temperature 97.6 F (36.4 C), temperature source Oral, resp. rate 17, height 5\' 4"  (1.626 m), weight 53.5 kg, SpO2 100 %. General: Well-appearing, not in distress Eyes: The right pupil is equal round and reactive to light and accommodation, the left pupil is large and irregular, it is nonreactive, there are small white deposits seen on the lens and cornea HEENT: There are no lacerations on the tongue, the uvula touches the posterior tongue Cardiac: 2/6 systolic murmur loudest over right upper sternal border, no rubs or gallops, no peripheral edema Pulmonary: Normal work of breathing, lungs are clear to auscultation GI: The abdomen is soft, nontender, nondistended Neuro: The left pupil is irregular and nonreactive, extraocular muscles are intact, no facial droop, tongue deviates midline, the palate raises evenly, hand strength 5 out of 5 bilateral, elbow extension 4 out of 5 bilateral, elbow flexion 5 out of 5 bilateral, right hip flexion 4 out of 5 limited by right groin pain, left hip flexion 5 out of 5,and station is intact and equal over the face, upper extremities, lower extremities MSK: There is no tenderness to palpation over bilateral hips, the right hip is not painful with flexion or windshield wiper testing Skin: There is a 1 cm superficial laceration and 2 small circular bruises on the right forearm, there is no decubitus ulcer, there is a large nonbleeding external hemorrhoid, there are no rashes over the exposed lower extremity, abdomen, back, or face  Extremities: Well perfused, no peripheral edema  EKG: personally reviewed my interpretation is sinus rhythm, rate 72, T wave inversion in lead 1 and aVL are new from prior,  left axis deviation, QRS 100 ms, tall notched R waves in I and aVL, deep S waves in V1-V3  Assessment & Plan by Problem: Active Problems:   Fall at home  Unwitnessed fall of unknown cause  Mild Mitral regurgitation  Ms. Hoback is a pleasant 84 year old woman with hypertension, controlled type 2 diabetes mellitus not requiring insulin, CKD 4, anemia of ckd, CVA, remote hx of unprovoked DVT x 2 ( on coumadin). She was brought to the ED by EMS after being found on the ground after activation of her life alert. She is not able to tell us the events from earlier today and is not oriented to place or time, it is unclear how far this is from her baseline self who lives on her own with outside assistance for ADLs. She had a CT scan of the head and neck which showed multiple old infarctions and cervical spine disease with a new left scalp hematoma but no acute intracranial findings. We will work her up broadly for causes of syncope and evaluate her fall risk while monitoring for improvement of her mental status.  -  new LBBB, Telemetry monitoring -  Report of palpitation with standing, Orthostatic vital signs - reported increased urinary frequency in the setting of confusion with unknonw baseline, obtain urinalysis  - hx of mitral regurgitation on last echo 2017, follow up echo  - Hgb A1c 6.8 on last check, follow up Hgb A1c to assess for hypoglycemia  - PT and OT evaluation, we appreciate their recommendations - ? Dementia at baseline, will monitor and reinforce delirium precautions   New left bundle branch block -Obtain troponins x3 -Follow-up morning EKG  Anemia of CKD  Hgb of 9.6 is close to baseline of 9-10.  - follow up repeat CBC tomorrow   CKD  Creatinine 2.27 up from baseline of 2.0 -  2.3. A renal ultrasound was obtained in the ED and showed signs of medical renal disease without hydronephrosis. She appears euvolemic on exam, BUN is elevated at 43 and she is not oriented which could be a  reflection of uremia or baseline dementia, there is no hyperkalmia.  - Follow BMP daily   Right Hip pain Patient reports right hip pain with right leg strength limited by weakness after a fall.  -Obtain right x-rays  External hemorrhoids  Symptomatic external hemorrhoids found on admission exam.  -Start treatment with Tucks pads  Dispo: Admit patient to Inpatient with expected length of stay greater than 2 midnights.  Signed: Ledell Noss, MD 07/11/2018, 8:02 AM  Pager: [phone]

## 2018-07-11 NOTE — ED Notes (Signed)
Patient ambulated to restroom with cane and needed on person assist. She had to be wheeled back to her room due to weakness. She reports she thinks she can go home since she does not have to walk that far in her home and with help of her kids.

## 2018-07-12 DIAGNOSIS — F0390 Unspecified dementia without behavioral disturbance: Secondary | ICD-10-CM | POA: Clinically undetermined

## 2018-07-12 DIAGNOSIS — E860 Dehydration: Secondary | ICD-10-CM

## 2018-07-12 DIAGNOSIS — R791 Abnormal coagulation profile: Secondary | ICD-10-CM

## 2018-07-12 LAB — CBC
HCT: 28.2 % — ABNORMAL LOW (ref 36.0–46.0)
Hemoglobin: 9.4 g/dL — ABNORMAL LOW (ref 12.0–15.0)
MCH: 31.4 pg (ref 26.0–34.0)
MCHC: 33.3 g/dL (ref 30.0–36.0)
MCV: 94.3 fL (ref 80.0–100.0)
Platelets: 217 10*3/uL (ref 150–400)
RBC: 2.99 MIL/uL — ABNORMAL LOW (ref 3.87–5.11)
RDW: 13.1 % (ref 11.5–15.5)
WBC: 7.9 10*3/uL (ref 4.0–10.5)
nRBC: 0 % (ref 0.0–0.2)

## 2018-07-12 LAB — GLUCOSE, CAPILLARY
Glucose-Capillary: 218 mg/dL — ABNORMAL HIGH (ref 70–99)
Glucose-Capillary: 214 mg/dL — ABNORMAL HIGH (ref 70–99)
Glucose-Capillary: 432 mg/dL — ABNORMAL HIGH (ref 70–99)
Glucose-Capillary: 389 mg/dL — ABNORMAL HIGH (ref 70–99)
Glucose-Capillary: 282 mg/dL — ABNORMAL HIGH (ref 70–99)
Glucose-Capillary: 84 mg/dL (ref 70–99)

## 2018-07-12 LAB — BASIC METABOLIC PANEL
Anion gap: 10 (ref 5–15)
BUN: 38 mg/dL — ABNORMAL HIGH (ref 8–23)
CO2: 24 mmol/L (ref 22–32)
Calcium: 8.9 mg/dL (ref 8.9–10.3)
Chloride: 102 mmol/L (ref 98–111)
Creatinine, Ser: 2.1 mg/dL — ABNORMAL HIGH (ref 0.44–1.00)
GFR calc Af Amer: 24 mL/min — ABNORMAL LOW (ref >60)
GFR calc non Af Amer: 20 mL/min — ABNORMAL LOW (ref >60)
Glucose, Bld: 196 mg/dL — ABNORMAL HIGH (ref 70–99)
Potassium: 3.5 mmol/L (ref 3.5–5.1)
Sodium: 136 mmol/L (ref 135–145)

## 2018-07-12 LAB — ECHOCARDIOGRAM LIMITED
Height: 64 in
Weight: 1888 oz

## 2018-07-12 LAB — URINALYSIS, ROUTINE W REFLEX MICROSCOPIC
Bilirubin Urine: NEGATIVE
Glucose, UA: 150 mg/dL — AB
Hgb urine dipstick: NEGATIVE
Ketones, ur: NEGATIVE mg/dL
Nitrite: NEGATIVE
Protein, ur: 100 mg/dL — AB
Specific Gravity, Urine: 1.016 (ref 1.005–1.030)
pH: 5 (ref 5.0–8.0)

## 2018-07-12 LAB — PROTIME-INR
INR: 1.7
Prothrombin Time: 19.8 seconds — ABNORMAL HIGH (ref 11.4–15.2)

## 2018-07-12 LAB — CK: Total CK: 122 U/L (ref 38–234)

## 2018-07-12 MED ORDER — INSULIN ASPART 100 UNIT/ML ~~LOC~~ SOLN
0.0000 [IU] | Freq: Three times a day (TID) | SUBCUTANEOUS | Status: DC
  Administered 2018-07-13: 3 [IU] via SUBCUTANEOUS
  Administered 2018-07-14: 1 [IU] via SUBCUTANEOUS
  Administered 2018-07-14: 3 [IU] via SUBCUTANEOUS
  Administered 2018-07-15: 1 [IU] via SUBCUTANEOUS
  Administered 2018-07-15 – 2018-07-16 (×2): 2 [IU] via SUBCUTANEOUS
  Administered 2018-07-16: 1 [IU] via SUBCUTANEOUS
  Administered 2018-07-17 (×2): 2 [IU] via SUBCUTANEOUS

## 2018-07-12 MED ORDER — INSULIN GLARGINE 100 UNIT/ML ~~LOC~~ SOLN
7.0000 [IU] | Freq: Every day | SUBCUTANEOUS | Status: DC
  Administered 2018-07-12: 7 [IU] via SUBCUTANEOUS
  Filled 2018-07-12 (×2): qty 0.07

## 2018-07-12 MED ORDER — INSULIN ASPART 100 UNIT/ML ~~LOC~~ SOLN
0.0000 [IU] | Freq: Three times a day (TID) | SUBCUTANEOUS | Status: DC
  Administered 2018-07-12: 8 [IU] via SUBCUTANEOUS

## 2018-07-12 MED ORDER — HYDRALAZINE HCL 50 MG PO TABS
50.0000 mg | ORAL_TABLET | Freq: Three times a day (TID) | ORAL | Status: DC
  Administered 2018-07-12 – 2018-07-13 (×4): 50 mg via ORAL
  Filled 2018-07-12 (×4): qty 1

## 2018-07-12 MED ORDER — INSULIN ASPART 100 UNIT/ML ~~LOC~~ SOLN
10.0000 [IU] | Freq: Once | SUBCUTANEOUS | Status: AC
  Administered 2018-07-12: 6 [IU] via SUBCUTANEOUS

## 2018-07-12 MED ORDER — INSULIN ASPART 100 UNIT/ML ~~LOC~~ SOLN: 0.0000 [IU] | Freq: Every day | SUBCUTANEOUS | Status: DC

## 2018-07-12 MED ORDER — LINAGLIPTIN 5 MG PO TABS
2.5000 mg | ORAL_TABLET | Freq: Every day | ORAL | Status: DC
  Administered 2018-07-12 – 2018-07-17 (×6): 2.5 mg via ORAL
  Filled 2018-07-12 (×6): qty 1

## 2018-07-12 MED ORDER — WARFARIN SODIUM 5 MG PO TABS
5.0000 mg | ORAL_TABLET | ORAL | Status: DC
  Administered 2018-07-13 – 2018-07-17 (×4): 5 mg via ORAL
  Filled 2018-07-12 (×4): qty 1

## 2018-07-12 MED ORDER — WARFARIN SODIUM 7.5 MG PO TABS
7.5000 mg | ORAL_TABLET | ORAL | Status: DC
  Administered 2018-07-12 – 2018-07-14 (×2): 7.5 mg via ORAL
  Filled 2018-07-12 (×2): qty 1

## 2018-07-12 NOTE — Progress Notes (Signed)
Inpatient Diabetes Program Recommendations  AACE/ADA: New Consensus Statement on Inpatient Glycemic Control (2019)  Target Ranges:  Prepandial:   less than 140 mg/dL      Peak postprandial:   less than 180 mg/dL (1-2 hours)      Critically ill patients:  140 - 180 mg/dL   Results for Chelsea Mcdaniel, Chelsea Mcdaniel (MRN [id number]) as of 07/12/2018 10:58  Ref. Range 07/11/2018 02:13 07/11/2018 16:16 07/11/2018 21:07 07/12/2018 08:39  Glucose-Capillary Latest Ref Range: 70 - 99 mg/dL 166 (H) 237 (H) 216 (H) 218 (H)   Review of Glycemic Control  Diabetes history: DM2 Outpatient Diabetes medications: Januvia 25 mg daily Current orders for Inpatient glycemic control: CBG monitoring  Inpatient Diabetes Program Recommendations:  Correction (SSI): While inpatient, please consider ordering Novolog 0-9 units TID with meals and Novolog 0-5 units QHS.  Thanks, Barnie Alderman, RN, MSN, CDE Diabetes Coordinator Inpatient Diabetes Program [phone] (Team Pager from 8am to 5pm)

## 2018-07-12 NOTE — Progress Notes (Signed)
CBG: 432. MD made aware. Verbal order from MD to give patient 8 units of Novolog sq. Will recheck blood sugar in 15 mins.   Farley Ly RN

## 2018-07-12 NOTE — [place] FL2 (Addendum)
[place] MEDICAID FL2 LEVEL OF CARE SCREENING TOOL     IDENTIFICATION  Patient Name: Chelsea Mcdaniel Birthdate: Apr 22, 1928 Sex: female Admission Date (Current Location): 07/11/2018  [place] and [place] Number:  Kathleen Argue [id number] [place] and Address:  [facility], [place] [address], [place], [place] [address]      Provider Number: [id number]  Attending Physician Name and Address:  Oda Kilts, MD  Relative Name and Phone Number:  Mariann Palo, [phone] (h) and [phone] (mobile); Hilda Blades Sibley-daughter - [phone]    Current Level of Care: Hospital Recommended Level of Care: [place] Prior Approval Number:    Date Approved/Denied:   PASRR Number: [id number](Eff. 07/12/18)  Discharge Plan: SNF    Current Diagnoses: Patient Active Problem List   Diagnosis Date Noted  . Fall at home 07/11/2018  . Malaise and fatigue 05/02/2018  . Weight loss 02/28/2018  . Ankle edema, bilateral 04/18/2017  . Senile purpura ([place]) 12/18/2016  . Immunization not carried out because of parent refusal 12/18/2016  . Advance care planning 02/18/2016  . Fatigue 10/20/2015  . Unequal blood pressure in upper extremities 10/20/2015  . Choledocholithiasis 02/11/2015  . Constipation 01/01/2015  . Cervical radiculopathy 12/10/2014  . Left knee pain 08/24/2014  . Vitamin D insufficiency 08/24/2014  . Carpal tunnel syndrome 01/07/2014  . 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 Healthcare maintenance 08/15/2013  . CKD (chronic kidney disease) stage 4, GFR 15-29 ml/min (HCC) 02/16/2012  . Osteoarthritis of hand 02/16/2012  . Long-term (current) use of anticoagulants 08/11/2010  . Type 2 diabetes mellitus ([place]) 12/12/2006  . Hyperlipidemia 12/12/2006  . Anemia of chronic disease 12/12/2006  . Hypertension associated with diabetes ([place]) 12/12/2006  . Late effect of cerebrovascular accident (CVA)  09/19/2006    Orientation RESPIRATION BLADDER Height & Weight     Self  Normal Continent Weight: 117 lb 15.1 oz (53.5 kg) Height:  5\' 4"  (162.6 cm)  BEHAVIORAL SYMPTOMS/MOOD NEUROLOGICAL BOWEL NUTRITION STATUS      Continent Diet(Regular)  AMBULATORY STATUS COMMUNICATION OF NEEDS Skin   Limited Assist(Min guard during evaluation on 12/17) Verbally Skin abrasions(Abrasion right arm)                       Personal Care Assistance Level of Assistance  Bathing, Feeding, Dressing Bathing Assistance: Limited assistance Upper body and Mod assist Lower body Feeding - Assistance with set-up Dressing Assistance: Limited assistance upper body and Mod assist lower body     Functional Limitations Info  Sight, Hearing, Speech Sight Info: Impaired(Blind left eye and glaucoma ). Wears glasses Hearing Info: Adequate Speech Info: Adequate    SPECIAL CARE FACTORS FREQUENCY  PT (By licensed PT)     PT Frequency: PT at SNF Eval and Treat.  Evaluated at hospital on 12/17.  OT Frequency: OT at SNF Eval and Treat. Evaluated at hospital on 12/18.            Contractures Contractures Info: Not present    Additional Factors Info  Code Status, Allergies Code Status Info: DNR Allergies Info: Diliazem, Metoprolol           Current Medications (07/12/2018):  This is the current hospital active medication list Current Facility-Administered Medications  Medication Dose Route Frequency Provider Last Rate Last Dose  . acetaminophen (TYLENOL) tablet 1,000 mg  1,000 mg Oral Q6H PRN Ledell Noss, MD      . allopurinol (ZYLOPRIM) tablet 100 mg  100 mg Oral Daily Ledell Noss, MD   100 mg at 07/12/18 0845  . amLODipine (NORVASC) tablet 5 mg  5 mg Oral Daily Ledell Noss, MD   5 mg at 07/12/18 0846  . [START ON 07/13/2018] cloNIDine (CATAPRES - Dosed in mg/24 hr) patch 0.2 mg  0.2 mg Transdermal Weekly Ledell Noss, MD      . dorzolamide-timolol (COSOPT) 22.3-6.8 MG/ML ophthalmic solution 1 drop  1 drop  Both Eyes BID Ledell Noss, MD   1 drop at 07/11/18 2137  . hydrALAZINE (APRESOLINE) tablet 50 mg  50 mg Oral Q8H Agyei, Obed K, MD      . latanoprost (XALATAN) 0.005 % ophthalmic solution 1 drop  1 drop Both Eyes QHS Ledell Noss, MD   1 drop at 07/11/18 2137  . [START ON 07/13/2018] warfarin (COUMADIN) tablet 5 mg  5 mg Oral Once per day on Sun Mon Tue Thu Sat Oda Kilts, MD      . warfarin (COUMADIN) tablet 7.5 mg  7.5 mg Oral Once per day on Wed Fri Oda Kilts, MD      . Warfarin - Physician Dosing Inpatient   Does not apply q1800 Oda Kilts, MD      . witch hazel-glycerin (TUCKS) pad   Topical PRN Ledell Noss, MD         Discharge Medications: Please see discharge summary for a list of discharge medications.  Relevant Imaging Results:  Relevant Lab Results:   Additional Information ss#[id number].  Sable Feil, LCSW

## 2018-07-12 NOTE — Clinical Social Work Note (Signed)
Clinical Social Work Assessment  Patient Details  Name: Chelsea Mcdaniel MRN: [id number] Date of Birth: Dec 07, 1927  Date of referral:  07/12/18               Reason for consult:  Facility Placement, Discharge Planning                Permission sought to share information with:  Family Supports Permission granted to share information::  Yes, Verbal Permission Granted  Name::     Chelsea Mcdaniel - daughter-in-law, [phone] (mobile) and [phone] (home) and son Chelsea Mcdaniel - [phone] (mobilile)  Agency::     Relationship::  Daughter-in-law and son  Contact Information:  Mrs. Chelsea Mcdaniel [phone] (mobile) and [phone] (home) and  Mr. Chelsea Mcdaniel - [phone] (mobile)  Housing/Transportation Living arrangements for the past 2 months:  Apartment Source of Information:  Patient, Other (Comment Required)(Daughter-in-law Chelsea Mcdaniel ) Patient Interpreter Needed:  None Criminal Activity/Legal Involvement Pertinent to Current Situation/Hospitalization:  No - Comment as needed Significant Relationships:  Other Family Members, Adult Children Lives with:  Self Do you feel safe going back to the place where you live?  No(Patient and daughter-in-law in agreement with SNF) Need for family participation in patient care:  Yes (Comment)  Care giving concerns:  Patient and daughter-in-law reported that the doctor had come to talk with them today and mentioned Chelsea Mcdaniel going to a nursing facility for rehab. Both expressed agreement with this recommendation as patient could benefit from rehab.  Social Worker assessment / plan:  CSW talked with patient and daughter-in-law Chelsea Mcdaniel regarding patient's discharge disposition. Chelsea Mcdaniel was sitting up in a chair at bedside and was alert and somewhat oriented. During the conversation, patient asked her daughter-in-law to turn off the light in a room in her home. And Chelsea Mcdaniel reminded patient that she was in the hospital. CSW advised  that patient had a total of 11 children and some died at birth or soon after and she currently has 4 living children.  CSW talked with patient and daughter-in-law regarding ST rehab, provided a SNF list and explained the facility search process and daughter-in-law expressed understanding. She provided CSW with her husbands phone number in case CSW cannot reach her.   Employment status:  Retired Forensic scientist:  Medicaid [place], [employer]) PT Recommendations:  [place] / Referral to community resources:  [place] Facility(Daughter-in-law provided with SNF list)  Patient/Family's Response to care: Patient and Chelsea Mcdaniel expressed no concerns regarding patient's care during hospitalization.  Patient/Family's Understanding of and Emotional Response to Diagnosis, Current Treatment, and Prognosis: Daughter-in-law expressed understanding of patient's need for rehab, as did the patient.   Emotional Assessment Appearance:  Appears stated age Attitude/Demeanor/Rapport:  Engaged Affect (typically observed):  Pleasant, Appropriate Orientation:  Oriented to Self Alcohol / Substance use:  Tobacco Use, Alcohol Use, Illicit Drugs(Per H&P patient has never smoked and does not consume alcohol or use illicit drugs) Psych involvement (Current and /or in the community):  No (Comment)  Discharge Needs  Concerns to be addressed:  Discharge Planning Concerns Readmission within the last 30 days:  No Current discharge risk:  None Barriers to Discharge:  Continued Medical Work up, [place], [name], LCSW 07/12/2018, 4:23 PM

## 2018-07-12 NOTE — Progress Notes (Signed)
  Date: 07/12/2018  Patient name: Chelsea Mcdaniel  Medical record number: [id number]  Date of birth: 10/09/1927   I have seen and evaluated this patient and I have discussed the plan of care with the house staff. Please see their note for complete details. I concur with their findings with the following additions/corrections:   Please see my separate attestation from the H&P from 07/11/2018  Lenice Pressman, M.D., Ph.D. 07/12/2018, 5:18 PM

## 2018-07-12 NOTE — Evaluation (Signed)
Occupational Therapy Evaluation Patient Details Name: Chelsea Mcdaniel MRN: [id number] DOB: 1927-08-08 Today's Date: 07/12/2018    History of Present Illness Ms. Burgin is a pleasant 84 year old woman with hypertension, controlled type 2 diabetes mellitus not requiring insulin, CKD 4, anemia of ckd, CVA, remote hx of unprovoked DVT x 2 ( on coumadin).  She is seen in the ED after presenting for a fall.  She does not remember the events from earlier today so this history was obtained from chart review and ED provider report.  EMS arrived at her home this morning after receiving alert that she had activated her life alert alarm.  She was found on the floor in her home.   Clinical Impression   Pt with decline in function and safety with ADLs and ADL mobility with decreased strength, balance, endurance and hx of cognitive impairments. Pt aide assist at home for meals, sup and min A with ADLs, uses cane for mobility. Pt would benefit from acute OT services to address impairments to maximize level of function and safety    Follow Up Recommendations  SNF;Supervision/Assistance - 24 hour    Equipment Recommendations  None recommended by OT    Recommendations for Other Services       Precautions / Restrictions Precautions Precautions: Fall Restrictions Weight Bearing Restrictions: No      Mobility Bed Mobility Overal bed mobility: Needs Assistance Bed Mobility: Supine to Sit     Supine to sit: Min guard     General bed mobility comments: Increased time  Transfers Overall transfer level: Needs assistance Equipment used: Rolling walker (2 wheeled) Transfers: Sit to/from Stand Sit to Stand: Min assist;+2 safety/equipment         General transfer comment: Min A +2 initially    Balance Overall balance assessment: Needs assistance;History of Falls Sitting-balance support: No upper extremity supported;Feet supported Sitting balance-Leahy Scale: Fair     Standing balance  support: Bilateral upper extremity supported;During functional activity Standing balance-Leahy Scale: Poor                             ADL either performed or assessed with clinical judgement   ADL Overall ADL's : Needs assistance/impaired Eating/Feeding: Set up;Sitting;Supervision/ safety   Grooming: Wash/dry hands;Wash/dry face;Supervision/safety;Set up;Sitting   Upper Body Bathing: Minimal assistance;Sitting   Lower Body Bathing: Moderate assistance   Upper Body Dressing : Minimal assistance   Lower Body Dressing: Moderate assistance   Toilet Transfer: Minimal assistance;Ambulation;RW;Comfort height toilet;Grab bars;Cueing for safety;Cueing for sequencing   Toileting- Clothing Manipulation and Hygiene: Moderate assistance;Sit to/from stand       Functional mobility during ADLs: Minimal assistance;Cueing for safety;Cueing for sequencing;Rolling walker       Vision Baseline Vision/History: Wears glasses Wears Glasses: Reading only Patient Visual Report: No change from baseline       Perception     Praxis      Pertinent Vitals/Pain Pain Assessment: No/denies pain Pain Intervention(s): Monitored during session     Hand Dominance Right   Extremity/Trunk Assessment Upper Extremity Assessment Upper Extremity Assessment: Generalized weakness   Lower Extremity Assessment Lower Extremity Assessment: Defer to PT evaluation   Cervical / Trunk Assessment Cervical / Trunk Assessment: Kyphotic   Communication Communication Communication: No difficulties   Cognition Arousal/Alertness: Awake/alert Behavior During Therapy: Flat affect Overall Cognitive Status: Impaired/Different from baseline Area of Impairment: Orientation;Following commands;Safety/judgement;Awareness;Problem solving  Orientation Level: Disoriented to;Situation;Time     Following Commands: Follows one step commands consistently     Problem Solving: Difficulty  sequencing;Requires verbal cues     General Comments       Exercises     Shoulder Instructions      Home Living Family/patient expects to be discharged to:: Private residence Living Arrangements: Alone;Non-relatives/Friends;Children Available Help at Discharge: Family;Available PRN/intermittently Type of Home: House Home Access: Level entry     Home Layout: One level     Bathroom Shower/Tub: Teacher, early years/pre: Standard     Home Equipment: Environmental consultant - 4 wheels;Cane - single point;Toilet riser;Shower seat;Hand held shower head;Grab bars - tub/shower          Prior Functioning/Environment Level of Independence: Independent with assistive device(s);Needs assistance  Gait / Transfers Assistance Needed: rollator in home ADL's / Homemaking Assistance Needed: Aide min assist with bathing and dressing.  Daugther and son always made sure pt had meals.     Comments: Per caregiver/family, pt was able to ambulate all over house on her own with rollator.          OT Problem List: Decreased strength;Decreased activity tolerance;Decreased cognition;Decreased knowledge of use of DME or AE;Decreased safety awareness;Decreased knowledge of precautions;Impaired balance (sitting and/or standing)      OT Treatment/Interventions: Self-care/ADL training;DME and/or AE instruction;Therapeutic exercise;Therapeutic activities;Patient/family education    OT Goals(Current goals can be found in the care plan section) Acute Rehab OT Goals Patient Stated Goal: to go home OT Goal Formulation: With patient Time For Goal Achievement: 07/26/18 Potential to Achieve Goals: Good ADL Goals Pt Will Perform Grooming: with set-up;sitting;with caregiver independent in assisting Pt Will Perform Upper Body Bathing: with supervision;with set-up;sitting;with caregiver independent in assisting Pt Will Perform Lower Body Bathing: with min assist;with caregiver independent in assisting Pt Will  Perform Upper Body Dressing: with supervision;with set-up;with caregiver independent in assisting;sitting Pt Will Perform Lower Body Dressing: with min assist;with caregiver independent in assisting Pt Will Transfer to Toilet: with min guard assist;with supervision;ambulating Pt Will Perform Toileting - Clothing Manipulation and hygiene: with min assist;with caregiver independent in assisting  OT Frequency: Min 2X/week   Barriers to D/C: Decreased caregiver support          Co-evaluation              AM-PAC OT "6 Clicks" Daily Activity     Outcome Measure Help from another person eating meals?: None Help from another person taking care of personal grooming?: A Little Help from another person toileting, which includes using toliet, bedpan, or urinal?: A Lot Help from another person bathing (including washing, rinsing, drying)?: A Lot Help from another person to put on and taking off regular upper body clothing?: A Little Help from another person to put on and taking off regular lower body clothing?: A Lot 6 Click Score: 16   End of Session Equipment Utilized During Treatment: Gait belt;Rolling walker  Activity Tolerance: Patient tolerated treatment well Patient left: in chair;with call bell/phone within reach;with chair alarm set;with family/visitor present;with nursing/sitter in room  OT Visit Diagnosis: Unsteadiness on feet (R26.81);Other abnormalities of gait and mobility (R26.89);Muscle weakness (generalized) (M62.81);History of falling (Z91.81);Other symptoms and signs involving cognitive function                Time: 0347-4259 OT Time Calculation (min): 25 min Charges:  OT General Charges $OT Visit: 1 Visit OT Evaluation $OT Eval Moderate Complexity: 1 Mod OT Treatments $Therapeutic Activity: 8-22  mins     Emmit Alexanders [facility] 07/12/2018, 1:18 PM

## 2018-07-12 NOTE — Progress Notes (Addendum)
   Subjective: HD#1  Overnight: No acute events  Today Chelsea Mcdaniel was examined at the nursing station with staff present due to delirium though she was very pleasant.  She denied dysuria, urinary urgency, abdominal pain, cough, chest pain, palpitation, upper respiratory viral symptoms.  When asked about reason for admission, she stated " I do not know."  She also recent falls.  Objective:  Vital signs in last 24 hours: Vitals:   07/11/18 1230 07/11/18 1617 07/11/18 2107 07/12/18 0446  BP: (!) 170/96 (!) 176/75 (!) 185/77 (!) 183/66  Pulse: 85 85 90 69  Resp: (!) 29 18 18 18   Temp:  98.3 F (36.8 C) 98.6 F (37 C) 98 F (36.7 C)  TempSrc:  Oral Oral   SpO2: 100% 100% 97% 100%  Weight:   53.5 kg   Height:       Constitutional: In no apparent distress, alert and oriented x0, pleasantly delirious Cardiovascular: Systolic murmur loudest at the second left intercostal, RRR, no gallops or rubs. Abdomen: Bowel sounds present, nondistended, nontender to palpation Extremity: No pitting edema.  Assessment/Plan:  Active Problems:   Fall at home  Chelsea Mcdaniel is a very pleasant 84 year old African-American woman with hypertension, type 2 diabetes mellitus, CKD stage IV, anemia of chronic disease, previous stroke, unprovoked DVT on Coumadin who presented to the emergency department on 07/11/2018 with unwitnessed fall and altered mental status.  Altered mental status: She continues to be delirious though very pleasant.  She is quite conversational but is however not alert and oriented.  Has tried to move out of bed several times.  So far there is no clear etiology for her AMS.  Differential diagnosis includes delirium, Alzheimer's dementia though she does not carry an official diagnosis, infectious causes such as UTI though she denies dysuria or urinary urgency or frequency and has remained afebrile without leukocytosis, polypharmacy- not on any centrally acting medications, low suspicion for  pneumonia, stroke also less likely she has no neurological deficits and also CT head was unremarkable, her electrolytes has been unremarkable.  I will avoid haloperidol or Risperdal for now as she is not combative.  Urinalysis is negative for pyuria but does show small leukocyte and rare bacteria. -Follow-up urine culture -One-to-one sitter -Delirium precautions -Follow-up orthostatic vitals stable - PT/OT to follow  Unwitnessed fall: Complained of right hip pain on arrival.  X-ray with no evidence of fracture.  Per physical therapy, she was unable to bear weight on the right hip -PT/OT to follow  New left bundle branch block History of mild mitral regurg Found to have new LBBB on initial EKG.  View of telemetry monitor this morning unremarkable.  She remains hemodynamically stable.  Troponin has been negative x3 -Follow-up EKG -Continue telemetry monitoring -Pending echocardiogram read  Anemia of chronic disease: Stable  Type 2 diabetes mellitus: Repeat A1c of 6.7.  Very well controlled  Hypertension: BP elevated this a.m. at 183/66.  Her goal BP is 150/90 -Continue amlodipine 5 mg daily, clonidine 0.2 mg patch, hydralazine 50 mg TID -Home antihypertensives include furosemide 80 mg daily, amlodipine 5 mg daily, hydralazine 100 mg TID, clonidine patch 0.2 mg  External hemorrhoids: Continue Tucks pads  Gout: continue allopurinol  FEN: Replace electrolytes as needed, regular diet VTE prophylaxis: Warfarin CODE STATUS: DNR  Disposition: Discussed with family regarding PTs recommendation for SNF placement.  Dispo: Anticipated discharge in approximately 1-2 day(s).   [name], MD 07/12/2018, 7:06 AM Pager: [phone] IMTS PGY-1

## 2018-07-13 LAB — BASIC METABOLIC PANEL
Anion gap: 12 (ref 5–15)
BUN: 44 mg/dL — ABNORMAL HIGH (ref 8–23)
CO2: 24 mmol/L (ref 22–32)
Calcium: 8.8 mg/dL — ABNORMAL LOW (ref 8.9–10.3)
Chloride: 100 mmol/L (ref 98–111)
Creatinine, Ser: 2.46 mg/dL — ABNORMAL HIGH (ref 0.44–1.00)
GFR calc Af Amer: 20 mL/min — ABNORMAL LOW (ref >60)
GFR calc non Af Amer: 17 mL/min — ABNORMAL LOW (ref >60)
Glucose, Bld: 109 mg/dL — ABNORMAL HIGH (ref 70–99)
Potassium: 3.6 mmol/L (ref 3.5–5.1)
Sodium: 136 mmol/L (ref 135–145)

## 2018-07-13 LAB — PROTIME-INR
INR: 1.58
Prothrombin Time: 18.7 seconds — ABNORMAL HIGH (ref 11.4–15.2)

## 2018-07-13 LAB — GLUCOSE, CAPILLARY
Glucose-Capillary: 104 mg/dL — ABNORMAL HIGH (ref 70–99)
Glucose-Capillary: 95 mg/dL (ref 70–99)
Glucose-Capillary: 97 mg/dL (ref 70–99)
Glucose-Capillary: 234 mg/dL — ABNORMAL HIGH (ref 70–99)
Glucose-Capillary: 110 mg/dL — ABNORMAL HIGH (ref 70–99)

## 2018-07-13 MED ORDER — INSULIN GLARGINE 100 UNIT/ML ~~LOC~~ SOLN
5.0000 [IU] | Freq: Every day | SUBCUTANEOUS | Status: DC
  Administered 2018-07-13 – 2018-07-16 (×4): 5 [IU] via SUBCUTANEOUS
  Filled 2018-07-13 (×5): qty 0.05

## 2018-07-13 MED ORDER — AMLODIPINE BESYLATE 5 MG PO TABS: 2.5000 mg | ORAL_TABLET | Freq: Every day | ORAL | Status: DC

## 2018-07-13 MED ORDER — SODIUM CHLORIDE 0.9 % IV BOLUS: 750.0000 mL | Freq: Once | INTRAVENOUS | Status: DC

## 2018-07-13 MED ORDER — HYDRALAZINE HCL 25 MG PO TABS
25.0000 mg | ORAL_TABLET | Freq: Three times a day (TID) | ORAL | Status: DC
  Administered 2018-07-13 – 2018-07-17 (×12): 25 mg via ORAL
  Filled 2018-07-13 (×12): qty 1

## 2018-07-13 NOTE — Discharge Summary (Deleted)
Name: Chelsea Mcdaniel MRN: [id number] DOB: March 22, 1928 84 y.o. PCP: Chelsea Mage, MD  Date of Admission: 07/11/2018  1:21 AM Date of Discharge:  Attending Physician: Chelsea Kilts, MD  Discharge Diagnosis: 1.  Altered mental status 2.  Unwitnessed fall 3.  New left bundle branch block 4.  Anemia of chronic disease 5.  Type 2 diabetes mellitus 6.  CKD4 7.  External hemorrhoids 8.  Gout  Discharge Medications: Allergies as of 07/14/2018      Reactions   Diltiazem Other (See Comments)   Symptomatic bradaycardia   Metoprolol Other (See Comments)   Symptomatic bradycardia      Medication List    TAKE these medications   ACCU-CHEK AVIVA PLUS w/Device Kit Check blood sugar 1 time a day   ACCU-CHEK SOFTCLIX LANCET DEV Kit Use to check blood sugar one time a day   ACCU-CHEK SOFTCLIX LANCETS lancets Used to check blood sugar 3 times daily. Dx code E11.8   allopurinol 100 MG tablet Commonly known as:  ZYLOPRIM Take 1 tablet (100 mg total) by mouth daily.   amLODipine 5 MG tablet Commonly known as:  NORVASC Take 5 mg by mouth daily.   cloNIDine 0.2 mg/24hr patch Commonly known as:  CATAPRES - Dosed in mg/24 hr Place 1 patch (0.2 mg total) onto the skin once a week.   dorzolamide-timolol 22.3-6.8 MG/ML ophthalmic solution Commonly known as:  COSOPT Place 1 drop into both eyes 2 (two) times daily.   furosemide 10 MG/ML solution Commonly known as:  LASIX Take 8 mLs (80 mg total) by mouth daily as needed for edema. What changed:    when to take this  reasons to take this   glucose blood test strip Commonly known as:  ACCU-CHEK AVIVA Use as instructed   glucose blood test strip Commonly known as:  ACCU-CHEK AVIVA PLUS USE 1 TIME DAILY TO CHECK BLOOD SUGAR   hydrALAZINE 10 MG tablet Commonly known as:  APRESOLINE Take 1 tablet (10 mg total) by mouth 3 (three) times daily. What changed:    medication strength  how much to take   JANUVIA 25 MG  tablet Generic drug:  sitaGLIPtin TAKE 1 TABLET BY MOUTH EVERY DAY What changed:  how much to take   LUMIGAN 0.01 % Soln Generic drug:  bimatoprost Place 1 drop into both eyes at bedtime.   polyethylene glycol packet Commonly known as:  MIRALAX Take 17 g by mouth 2 (two) times daily.   warfarin 5 MG tablet Commonly known as:  COUMADIN Take as directed. If you are unsure how to take this medication, talk to your nurse or doctor. Original instructions:  TAKE 1 TABLET BY MOUTH EVERY DAY AT 6 PM EXCEPT WEDNESDAY AND FRIDAY TAKE 1 & 1/2 TABLETS ON THOSE DAYS What changed:    See the new instructions.  Another medication with the same name was removed. Continue taking this medication, and follow the directions you see here.       Disposition and follow-up:   Chelsea Mcdaniel was discharged from [facility] in [place] condition.  At the hospital follow up visit please address:  1.  Unwitnessed fall, Hypertension: Most likely secondary to orthostatic hypotension.  Please ensure adequate hydration and consider titrating down blood pressure medications. (Was maintained on amlodipine 5 mg daily, clonidine patch 0.2 mg and hydralazine 25 mg TID with good control).  With her age, BP goal is 150/90  Discharged blood pressure meds: - Amlodipine 5  mg daily - Clonidine patch 0.2 mg - Hydralazine 10 mg TID (previously on hydralazine 100 mg TID) - Lasix 80 mg prn lower extremity swelling  New left bundle branch block: Work-up was negative for acute coronary syndrome.  Repeat EKG was unremarkable.  AMS: Unclear etiology.  Delirium vs concussion vs ? Alzheimer's dementia  2.  Labs / imaging needed at time of follow-up: CMP  3.  Pending labs/ test needing follow-up: Urine culture.  Follow-up Appointments:   Hospital Course by problem list: 1.  Altered mental status: Chelsea Mcdaniel is a very pleasant 84 year old African-American woman with hypertension, type 2 diabetes mellitus,  CKD stage IV, anemia of chronic disease, previous stroke, unprovoked DVT on Coumadin who presented to the emergency department on 07/11/2018 with unwitnessed fall and altered mental status.  On hospital day 1, she was found to be delirious though not combative or agitated.  She did not have any further episodes of confusion, delirium or altered mental status. Differential diagnosis for her AMS includes delirium, Alzheimer's dementia though she does not carry an official diagnosis, infectious causes such as UTI though she denies dysuria or urinary urgency or frequency and remained afebrile without leukocytosis, polypharmacy- not on any centrally acting medications, low suspicion for pneumonia, stroke also less likely she has no neurological deficits and also CT head was unremarkable, her electrolytes were unremarkable.  2.  Unwitnessed fall: Chelsea Mcdaniel was found on the floor her son who called EMS.  She most likely had orthostatic hypotension which unfortunately resulted in her fall.  She had positive orthostatic vitals during this admission and oral rehydration was encouraged.   3.  New left bundle branch block: This was found on EKG that was obtained on admission.  Work-up for acute coronary syndrome was unremarkable.  She had a one-time episode of 3 runs of ventricular tachycardia on telemetry monitoring however she remained asymptomatic.  She has a long history of symptomatic bradycardia in the setting of beta-blocker and calcium channel blocker use which required admission.  Given her age, comorbidities and frailty, she would most likely not benefit from additional invasive work-up.  It is reassuring that repeat EKG was unremarkable.  4.  Type 2 diabetes mellitus: Chelsea Mcdaniel was maintained on Lantus 5 units at bedtime, sliding scale insulin with bedtime correction and Tradjenta 2.5 mg.  5.  Hypertension: Her blood pressure on admission was elevated with systolic in the 628Z and diastolic in the 66Q.  We  slowly started her on her home antihypertensives.  And was maintained on amlodipine 2.5 mg daily, clonidine patch 0.2 mg and hydralazine 25 mg 3 times daily.  Given her age and recent finding for orthostatic hypotension, I believe she will benefit from a moderate control of her blood pressure with goal of 150/90.    Discharge Vitals:   BP (!) 172/56 (BP Location: Right Arm)   Pulse 71   Temp 97.7 F (36.5 C) (Oral)   Resp 18   Ht 5' 4" (1.626 m)   Wt 53.4 kg   SpO2 98%   BMI 20.21 kg/m   Pertinent Labs, Studies, and Procedures:  CT HEAD:  1. LEFT scalp hematoma.  No acute intracranial process. 2. Old RIGHT frontoparietal and occipital lobe infarcts. Old LEFT basal ganglia and LEFT temporal infarcts. 3. Moderate to severe chronic small vessel ischemic changes.  CT CERVICAL SPINE:  1. No fracture or malalignment. 2. Extensive calcified pannus about the odontoid process seen with CPPD resulting in moderate canal stenosis. 3.  Severe C2-3 and C3-4 neural foraminal narrowing.  X-Ray Pelvis IMPRESSION: There is moderate osteoarthritic joint space loss of the right hip. No definite acute fracture is observed.  Discharge Instructions: Discharge Instructions    Call MD for:  difficulty breathing, headache or visual disturbances   Complete by:  As directed    Call MD for:  persistant dizziness or light-headedness   Complete by:  As directed    Call MD for:  persistant nausea and vomiting   Complete by:  As directed    Diet - low sodium heart healthy   Complete by:  As directed    Discharge instructions   Complete by:  As directed    Ms. Funari:  It was a pleasure taking care of you at the hospital.  You were admitted after you passed out.  We think this is because you were dehydrated which caused your blood pressure to drop.  We gave you some fluids and you improved.  Here are my recommendations after this hospital visit.  1.  I have made an appointment for you at the  internal medicine clinic on Friday, July 21, 2018 at 9:45 AM. 2.  I am decreasing your hydralazine to 10 mg 3 times a day.  3.  For your Lasix, I would like for you to take it as needed for your lower extremity swelling.  At least until you see [place] at the clinic next Friday. 4.  I would like for you to increase fluid intake  Take care and happy holidays. ~Dr. Eileen Stanford   Increase activity slowly   Complete by:  As directed       Signed: [name], MD 07/14/2018, 1:30 PM   Pager: [phone] IMTS PGY-1

## 2018-07-13 NOTE — Progress Notes (Signed)
Safety sitter discontinued at this time.

## 2018-07-13 NOTE — Progress Notes (Addendum)
   Subjective: HD #2  Overnight: Denies any acute events.  Chelsea Mcdaniel, Chelsea Mcdaniel appeared very comfortable and pleasant this morning.  Chelsea Mcdaniel was found in good spirits and denied chest pain, shortness of breath, dizziness, lightheadedness, syncope, back pain, nausea, vomiting, headaches.  Chelsea Mcdaniel still unable to recall the inciting events that had led to this hospital admission.  Objective:  Vital signs in last 24 hours: Vitals:   07/12/18 0446 07/12/18 1018 07/12/18 2144 07/13/18 0629  BP: (!) 183/66 (!) 196/80 (!) 129/59 (!) 112/43  Pulse: 69 86 64 (!) 54  Resp: 18 18 16 17   Temp: 98 F (36.7 C) (!) 97.4 F (36.3 C) 97.8 F (36.6 C) 99 F (37.2 C)  TempSrc:  Oral Oral Oral  SpO2: 100% 100% 100% 97%  Weight:   53.4 kg   Height:       Const: Alert and oriented, in no acute distress CV: Regular rate and rhythm.  No murmurs, gallops, rubs Resp: Clear to auscultation bilaterally  Assessment/Plan:  Principal Problem:   Fall at home Active Problems:   Type 2 diabetes mellitus ([place])   Hypertension associated with diabetes ([place])   Delirium  Chelsea Mcdaniel is a very pleasant 84 year old African-American woman with hypertension, type 2 diabetes mellitus, CKD stage IV, anemia of chronic disease, previous stroke, unprovoked DVT on Coumadin who presented to the emergency department on 07/11/2018 with unwitnessed fall and altered mental status.  Altered mental status: There have been no reports of delirium within the past 24 hours.  Chelsea Mcdaniel is alert this morning and conversational.  Etiology of her presenting altered mental status is still unclear.  As stated in my previous note differential include polypharmacy vs delirium vs cognitive decline though there is no official diagnosis of Alzheimer's dementia. -Follow-up urine culture -Delirium precaution -PT/OT follow-up.  Recommend SNF  Unwitnessed fall New left bundle branch block Highly suspecting orthostatic hypotension to be the cause of her fall.   Found to have positive orthostasis on vital measurement with SBP dropping from 159 to 100 from sitting to standing.  In addition, Chelsea Mcdaniel could have an underlying cardiac electrical signal abnormality.  Chelsea Mcdaniel was noted to have 3 runs of V. tach on telemetry monitoring this morning though Chelsea Mcdaniel remained asymptomatic.  Chelsea Mcdaniel does have a history of symptomatic bradycardia which required admission in the setting of beta-blocker and calcium channel blocker use about 2 years ago.  During that admission Chelsea Mcdaniel had a junctional escape rhythm.  -Continue cardiac monitoring. -Follow-up EKG  Anemia of chronic disease:  Stable  Type 2 diabetes mellitus:  CBG this am of 97. Noted to be hyperglycemic yesterday in the 400s.  Will closely monitor as Chelsea Mcdaniel was found to have an A1c of 6.7 and with her age, I would not push for tight glycemic control. - Continue Lantus 5 units qhs, SSI w/ bedtime correction, novolog TID w/ meals -Continue Tradjenta 2.5 mg daily.  CKD stage IV: sCr 2.4 (baseline of 2.0 - 2.3.) -Continue to monitor  Hypertension: BP stable though low at 106/45 -Decrease amlodipine 2.5 mg daily, clonidine 0.2 mg patch, hydralazine 25 mg TID -Home antihypertensives include furosemide 80 mg daily, amlodipine 5 mg daily, hydralazine 100 mg TID, clonidine patch 0.2 mg  External hemorrhoids: Continue Tucks pads  Gout: continue allopurinol  FEN: Replace electrolytes as needed, regular diet VTE prophylaxis: Warfarin CODE STATUS: DNR  Disposition: Pending SNF placement   [name], MD 07/13/2018, 6:45 AM Pager: [phone] IMTS PGY-1

## 2018-07-13 NOTE — Progress Notes (Addendum)
Physical Therapy Treatment Patient Details Name: Chelsea Mcdaniel MRN: [id number] DOB: [birth year] Today's Date: 07/13/2018    History of Present Illness Chelsea Mcdaniel is a pleasant 84 year old woman with hypertension, controlled type 2 diabetes mellitus not requiring insulin, CKD 4, anemia of ckd, CVA, remote hx of unprovoked DVT x 2 ( on coumadin).  She is seen in the ED after presenting for a fall.  She does not remember the events from earlier today so this history was obtained from chart review and ED provider report.  EMS arrived at her home this morning after receiving alert that she had activated her life alert alarm.  She was found on the floor in her home.    PT Comments    Patient able to progress with distance with ambulation this session.  Remains high fall risk due to difficulty with transitions with residual buttocks pain, limited vision and decreased safety/deficit awareness.  Also mildly symptomatic with orthostatic BP positive.  Feel safest d/c plan for STSNF prior to d/c home.    Orthostatic VS for the past 24 hrs (Last 3 readings):  BP- Sitting Pulse- Sitting BP- Standing at 0 minutes Pulse- Standing at 0 minutes BP- Standing at 3 minutes Pulse- Standing at 3 minutes  07/13/18 0900 159/57 60 109/48 60 100/55 60    Follow Up Recommendations  SNF;Supervision/Assistance - 24 hour     Equipment Recommendations  None recommended by PT    Recommendations for Other Services       Precautions / Restrictions Precautions Precautions: Fall Precaution Comments: orthostatic Restrictions Weight Bearing Restrictions: No    Mobility  Bed Mobility               General bed mobility comments: sitting EOB with RN and NT  Transfers Overall transfer level: Needs assistance Equipment used: Rolling walker (2 wheeled) Transfers: Sit to/from Stand Sit to Stand: Min assist         General transfer comment: up from EOB increased time, cues for hand  placement  Ambulation/Gait Ambulation/Gait assistance: Min assist Gait Distance (Feet): 90 Feet Assistive device: Rolling walker (2 wheeled) Gait Pattern/deviations: Step-through pattern;Decreased stride length;Drifts right/left     General Gait Details: increased time, assist for balance, assist for walker management for turns and over edge of safety mat in room mod cues for obstacles/direction due to vision limitations, cues for posture   Stairs             Wheelchair Mobility    Modified Rankin (Stroke Patients Only)       Balance Overall balance assessment: Needs assistance;History of Falls Sitting-balance support: No upper extremity supported;Feet supported Sitting balance-Leahy Scale: Good     Standing balance support: Bilateral upper extremity supported;During functional activity Standing balance-Leahy Scale: Poor Standing balance comment: relies on UE support on RW and min guard external support for balance.                              Cognition Arousal/Alertness: Awake/alert Behavior During Therapy: WFL for tasks assessed/performed   Area of Impairment: Memory;Safety/judgement;Orientation                 Orientation Level: Time   Memory: Decreased short-term memory Following Commands: Follows one step commands consistently Safety/Judgement: Decreased awareness of safety;Decreased awareness of deficits            Exercises      General Comments General comments (skin integrity, edema, etc.):  family and sitter in room      Pertinent Vitals/Pain Faces Pain Scale: Hurts little more Pain Location: butt when coming up to stand or sit Pain Descriptors / Indicators: Grimacing;Discomfort Pain Intervention(s): Monitored during session;Repositioned    Home Living                      Prior Function            PT Goals (current goals can now be found in the care plan section) Progress towards PT goals: Progressing  toward goals    Frequency    Min 2X/week      PT Plan Current plan remains appropriate;Frequency needs to be updated    Co-evaluation              AM-PAC PT "6 Clicks" Mobility   Outcome Measure  Help needed turning from your back to your side while in a flat bed without using bedrails?: A Little Help needed moving from lying on your back to sitting on the side of a flat bed without using bedrails?: A Little Help needed moving to and from a bed to a chair (including a wheelchair)?: A Little Help needed standing up from a chair using your arms (e.g., wheelchair or bedside chair)?: A Little Help needed to walk in hospital room?: A Little Help needed climbing 3-5 steps with a railing? : A Lot 6 Click Score: 17    End of Session Equipment Utilized During Treatment: Gait belt Activity Tolerance: Patient tolerated treatment well Patient left: with call bell/phone within reach;in chair;with family/visitor present;with nursing/sitter in room   PT Visit Diagnosis: Other abnormalities of gait and mobility (R26.89);History of falling (Z91.81);Muscle weakness (generalized) (M62.81)     Time: 2229-7989 PT Time Calculation (min) (ACUTE ONLY): 27 min  Charges:  $Gait Training: 8-22 mins $Therapeutic Activity: 8-22 mins                     Magda Kiel, [place] [phone] 07/13/2018    Reginia Naas 07/13/2018, 11:26 AM

## 2018-07-13 NOTE — Progress Notes (Signed)
  Date: 07/13/2018  Patient name: Chelsea Mcdaniel  Medical record number: [id number]  Date of birth: [birth year]   I have seen and evaluated this patient and I have discussed the plan of care with the house staff. Please see their note for complete details. I concur with their findings with the following additions/corrections:   Mental status improved today, more aware of her surroundings and interactive.  Sitter at bedside reports she has not been trying to get out of bed or chair.  She was successfully eating lunch when we saw her.  Review of telemetry shows 2 separate episodes of 3 beats of nonsustained ventricular tachycardia.  She also has intermittent first-degree AV block.  No bradycardia on this admission.  No chest pain or pressure or shortness of breath.  Echo showed no significant structural heart disease.  With her new LBBB, first-degree AV block, and rare NSVT, could consider ischemic work-up for her.  However, given her age and frailty, it is unlikely she would benefit from additional evaluation at this time.  Her positive orthostatics on admission would suggest this was likely orthostatic syncope.  We will need to focus on ensuring she maintains good hydration status to avoid orthostatic hypotension.  Blood pressure has been volatile here, will discuss with her PCP possible changes to her blood pressure regimen.  Lenice Pressman, M.D., Ph.D. 07/13/2018, 5:41 PM

## 2018-07-14 DIAGNOSIS — E43 Unspecified severe protein-calorie malnutrition: Secondary | ICD-10-CM | POA: Diagnosis present

## 2018-07-14 LAB — GLUCOSE, CAPILLARY
Glucose-Capillary: 116 mg/dL — ABNORMAL HIGH (ref 70–99)
Glucose-Capillary: 218 mg/dL — ABNORMAL HIGH (ref 70–99)
Glucose-Capillary: 131 mg/dL — ABNORMAL HIGH (ref 70–99)
Glucose-Capillary: 160 mg/dL — ABNORMAL HIGH (ref 70–99)

## 2018-07-14 LAB — BASIC METABOLIC PANEL
Anion gap: 11 (ref 5–15)
BUN: 49 mg/dL — ABNORMAL HIGH (ref 8–23)
CO2: 21 mmol/L — ABNORMAL LOW (ref 22–32)
Calcium: 8.7 mg/dL — ABNORMAL LOW (ref 8.9–10.3)
Chloride: 103 mmol/L (ref 98–111)
Creatinine, Ser: 2.57 mg/dL — ABNORMAL HIGH (ref 0.44–1.00)
GFR calc Af Amer: 19 mL/min — ABNORMAL LOW (ref >60)
GFR calc non Af Amer: 16 mL/min — ABNORMAL LOW (ref >60)
Glucose, Bld: 125 mg/dL — ABNORMAL HIGH (ref 70–99)
Potassium: 3.7 mmol/L (ref 3.5–5.1)
Sodium: 135 mmol/L (ref 135–145)

## 2018-07-14 LAB — PROTIME-INR
INR: 1.7
Prothrombin Time: 19.7 seconds — ABNORMAL HIGH (ref 11.4–15.2)

## 2018-07-14 MED ORDER — LACTATED RINGERS IV BOLUS
750.0000 mL | Freq: Once | INTRAVENOUS | Status: AC
  Administered 2018-07-14: 750 mL via INTRAVENOUS

## 2018-07-14 MED ORDER — HYDRALAZINE HCL 10 MG PO TABS: 10.0000 mg | ORAL_TABLET | Freq: Three times a day (TID) | ORAL | 0 refills | Status: DC

## 2018-07-14 MED ORDER — ADULT MULTIVITAMIN W/MINERALS CH
1.0000 | ORAL_TABLET | Freq: Every day | ORAL | Status: DC
  Administered 2018-07-14 – 2018-07-17 (×4): 1 via ORAL
  Filled 2018-07-14 (×4): qty 1

## 2018-07-14 MED ORDER — ENSURE ENLIVE PO LIQD
237.0000 mL | Freq: Two times a day (BID) | ORAL | Status: DC
  Administered 2018-07-14 – 2018-07-17 (×6): 237 mL via ORAL

## 2018-07-14 MED ORDER — FUROSEMIDE 10 MG/ML PO SOLN: 80.0000 mg | Freq: Every day | ORAL | 1 refills | Status: DC | PRN

## 2018-07-14 MED ORDER — AMLODIPINE BESYLATE 5 MG PO TABS
5.0000 mg | ORAL_TABLET | Freq: Every day | ORAL | Status: DC
  Administered 2018-07-14 – 2018-07-17 (×4): 5 mg via ORAL
  Filled 2018-07-14 (×4): qty 1

## 2018-07-14 NOTE — Progress Notes (Signed)
   Subjective: HD #3  Overnight: No acute events reported  Today, Ms. Delia was examined at bedside with her son present.  As always she was in a pleasant mood.  She reports she is doing well and denies any complaints at this time.  She is tolerating her diet with no issues.   Objective:  Vital signs in last 24 hours: Vitals:   07/13/18 0802 07/13/18 1641 07/13/18 2113 07/14/18 0448  BP: (!) 106/45 (!) 139/53 (!) 132/51 (!) 179/65  Pulse: (!) 54 62 60 73  Resp: 18 18 16 18   Temp: 98.7 F (37.1 C) 97.6 F (36.4 C) 98.3 F (36.8 C) 98.2 F (36.8 C)  TempSrc: Oral Oral Oral Oral  SpO2: 98% 98% 99% 96%  Weight:      Height:       Const: In no acute distress, speaks in full sentences, in a cheerful mood CV: RRR.  No murmurs, gallops, rubs  Assessment/Plan:  Principal Problem:   Fall at home Active Problems:   Type 2 diabetes mellitus ([place])   Hypertension associated with diabetes ([place])   Delirium  Ms. Schum is a very pleasant 84 year old African-American woman with hypertension, type 2 diabetes mellitus, CKD stage IV, anemia of chronic disease, previous stroke, unprovoked DVT on Coumadin who presented to the emergency department on 07/11/2018 with unwitnessed fall and altered mental status.  Altered mental status:  This has resolved and has not required a sitter in 48 hours.  Etiology still unclear. Differential include polypharmacy vs delirium vs cognitive decline though there is no official diagnosis of Alzheimer's -Delirium precaution -PT/OT to follow. -Stable to discharge to SNF once bed becomes available  Unwitnessed fall: Most likely secondary to orthostatic hypotension.  She is status post IV bolus with lactated Ringer's.  Appears well today.  Skin does not look dry and oral mucosa is moist. -PT/OT to follow -Stable to discharge to SNF once bed becomes available  Anemia of chronic disease:Stable  Type 2 diabetes mellitus:Continue Lantus 5 units qhs, SSI w/  bedtime correction, NovoLog TID w/ meals, Tradjenta 2.5 mg daily.  CKD stage IV: sCr 2.5 (baseline of 2.0 - 2.5.) -Continue to monitor  Hypertension:  Continue amlodipine 5 mg daily, clonidine 0.2 mg patch, hydralazine 25 mgTID.  BP goal 150/90 -Home antihypertensives include furosemide 80 mg daily, amlodipine 5 mg daily, hydralazine 100 mgTID, clonidine patch 0.2 mg  External hemorrhoids:Continue Tucks pads  Gout:continue allopurinol  New left bundle branch block: This was initially found on EKG on admission.  Repeat EKG was unremarkable.  FEN: Replace electrolytes as needed, regular diet VTE prophylaxis: Warfarin CODE STATUS: DNR  Disposition: Pending SNF placement  [name], MD 07/14/2018, 6:59 AM Pager: [phone] IMTS PGY-1

## 2018-07-14 NOTE — Progress Notes (Signed)
Occupational Therapy Treatment Patient Details Name: MADHURI VACCA MRN: [id number] DOB: 1927-10-17 Today's Date: 07/14/2018    History of present illness Ms. Whitlatch is a pleasant 84 year old woman with hypertension, controlled type 2 diabetes mellitus not requiring insulin, CKD 4, anemia of ckd, CVA, remote hx of unprovoked DVT x 2 ( on coumadin).  She is seen in the ED after presenting for a fall.  She does not remember the events from earlier today so this history was obtained from chart review and ED provider report.  EMS arrived at her home this morning after receiving alert that she had activated her life alert alarm.  She was found on the floor in her home.   OT comments  Pt is making steady progress toward goals.  She requires min A - mod A for ADLs and min A for functional mobility.  Continue to recommend SNF level rehab to allow her to maximize safety and independence, before return home.   Follow Up Recommendations  SNF;Supervision/Assistance - 24 hour    Equipment Recommendations  None recommended by OT    Recommendations for Other Services      Precautions / Restrictions Precautions Precautions: Fall Precaution Comments: orthostatic       Mobility Bed Mobility Overal bed mobility: Needs Assistance Bed Mobility: Supine to Sit     Supine to sit: Min assist     General bed mobility comments: assist to lift trunk   Transfers Overall transfer level: Needs assistance Equipment used: Rolling walker (2 wheeled) Transfers: Sit to/from [facility] Sit to Stand: Min assist Stand pivot transfers: Mod assist       General transfer comment: assist to power up from bed and assist for balance     Balance Overall balance assessment: Needs assistance;History of Falls Sitting-balance support: No upper extremity supported;Feet supported Sitting balance-Leahy Scale: Good Sitting balance - Comments: able to don shoes with min guard assist EOB    Standing  balance support: Single extremity supported Standing balance-Leahy Scale: Poor Standing balance comment: reliant on UE support                            ADL either performed or assessed with clinical judgement   ADL Overall ADL's : Needs assistance/impaired                     Lower Body Dressing: Moderate assistance Lower Body Dressing Details (indicate cue type and reason): Pt able to doff socks and don shoes.  Requires assist to tie shoes, assist for balance  Toilet Transfer: Minimal assistance;Ambulation;Comfort height toilet;RW   Toileting- Clothing Manipulation and Hygiene: Moderate assistance;Sit to/from stand       Functional mobility during ADLs: Minimal assistance;Rolling walker       Vision       Perception     Praxis      Cognition Arousal/Alertness: Awake/alert Behavior During Therapy: WFL for tasks assessed/performed Overall Cognitive Status: Impaired/Different from baseline Area of Impairment: Orientation;Attention;Memory;Following commands;Awareness;Safety/judgement;Problem solving                 Orientation Level: Time Current Attention Level: Sustained;Selective Memory: Decreased short-term memory Following Commands: Follows one step commands consistently Safety/Judgement: Decreased awareness of deficits;Decreased awareness of safety   Problem Solving: Difficulty sequencing;Requires verbal cues          Exercises     Shoulder Instructions       General Comments daugther in law  present     Pertinent Vitals/ Pain       Pain Assessment: No/denies pain  Home Living                                          Prior Functioning/Environment              Frequency  Min 2X/week        Progress Toward Goals  OT Goals(current goals can now be found in the care plan section)        Plan Discharge plan remains appropriate    Co-evaluation                 AM-PAC OT "6 Clicks"  Daily Activity     Outcome Measure   Help from another person eating meals?: None Help from another person taking care of personal grooming?: A Little Help from another person toileting, which includes using toliet, bedpan, or urinal?: A Little Help from another person bathing (including washing, rinsing, drying)?: A Little Help from another person to put on and taking off regular upper body clothing?: A Little Help from another person to put on and taking off regular lower body clothing?: A Lot 6 Click Score: 18    End of Session Equipment Utilized During Treatment: Gait belt;Rolling walker  OT Visit Diagnosis: Unsteadiness on feet (R26.81);Other abnormalities of gait and mobility (R26.89);Muscle weakness (generalized) (M62.81);History of falling (Z91.81);Other symptoms and signs involving cognitive function   Activity Tolerance Patient tolerated treatment well   Patient Left in chair;with call bell/phone within reach;with chair alarm set;with family/visitor present   Nurse Communication Mobility status        Time: 1749-4496 OT Time Calculation (min): 26 min  Charges: OT General Charges $OT Visit: 1 Visit OT Treatments $Self Care/Home Management : 8-22 mins $Therapeutic Activity: 8-22 mins  Lucille Passy, OTR/L Acute Rehabilitation Services Pager [phone] Office [phone]    Lucille Passy M 07/14/2018, 1:19 PM

## 2018-07-14 NOTE — Consult Note (Signed)
   [facility] CM Inpatient Consult   07/14/2018  SUMMERLYNN GLAUSER [birth year] [id number]   Patient screened for potential [facility] Care Management services due to unplanned readmission risk score of 22%, high, under Medicare ACO.   Spoke with RNCM. Current disposition is for patient to transition to SNF. No [facility] Care Management needs at this time.  Netta Cedars, MSN, [facility] Liaison Nurse Mobile Phone [phone]  Toll free office [phone]

## 2018-07-14 NOTE — Clinical Social Work Placement (Signed)
   CLINICAL SOCIAL WORK PLACEMENT  NOTE **07/14/18 - [facility]OSEN: See "Additional Comments" section below.  Date:  07/14/2018  Patient Details  Name: Chelsea Mcdaniel MRN: [id number] Date of Birth: 02-20-1928  Clinical Social Work is seeking post-discharge placement for this patient at the [place] level of care (*CSW will initial, date and re-position this form in  chart as items are completed):  Yes   Patient/family provided with [place] Work Department's list of facilities offering this level of care within the geographic area requested by the patient (or if unable, by the patient's family).  Yes   Patient/family informed of their freedom to choose among providers that offer the needed level of care, that participate in Medicare, Medicaid or managed care program needed by the patient, have an available bed and are willing to accept the patient.  Yes   Patient/family informed of [facility]'s ownership interest in [facility] and [facility], as well as of the fact that they are under no obligation to receive care at these facilities.  PASRR submitted to EDS on 07/12/18     PASRR number received on 07/12/18     Existing PASRR number confirmed on       FL2 transmitted to all facilities in geographic area requested by pt/family on 07/12/18     FL2 transmitted to all facilities within larger geographic area on       Patient informed that his/her managed care company has contracts with or will negotiate with certain facilities, including the following:        Yes   Patient/family informed of bed offers received.  Patient chooses bed at [facility]; per daughter-in law [name].      Physician recommends and patient chooses bed at      Patient to be transferred to [facility] received [facility]) on  .  Patient to be transferred to facility by       Patient family notified on   of  transfer.  Name of family member notified:        PHYSICIAN      Additional Comment:  07/14/18 - Call made to [facility] (12:59 pm) admissions director. They can take patient and initiated insurance authorization.   _______________________________________________ Sable Feil, LCSW 07/14/2018, 1:19 PM

## 2018-07-14 NOTE — Progress Notes (Signed)
  Date: 07/14/2018  Patient name: Chelsea Mcdaniel  Medical record number: [id number]  Date of birth: 1928-06-24   I have seen and evaluated this patient and I have discussed the plan of care with the house staff. Please see their note for complete details. I concur with their findings with the following additions/corrections:   Doing well again today.  Seen at the bedside with the team and her son, [name].  Alert and interactive, no sign of delirium today.  Review of telemetry shows no further episodes of NSVT, no bradycardia.  Plan for discharge to SNF, hopefully increased level of care will enable better nutrition and hydration and avoid further orthostatic hypotension.  Lenice Pressman, M.D., Ph.D. 07/14/2018, 6:51 PM

## 2018-07-14 NOTE — Progress Notes (Signed)
Initial Nutrition Assessment  DOCUMENTATION CODES:   Severe malnutrition in context of chronic illness  INTERVENTION:   - Add Ensure Enlive BID (each provides 350 kcal and 20 g protein) - Add MVI with minerals   NUTRITION DIAGNOSIS:   Severe Malnutrition related to chronic illness as evidenced by percent weight loss, severe fat depletion, severe muscle depletion.  GOAL:   Patient will meet greater than or equal to 90% of their needs  MONITOR:   PO intake, Supplement acceptance, Labs, Weight trends  REASON FOR ASSESSMENT:   Malnutrition Screening Tool    ASSESSMENT:   84 yo female, admitted after a fall at home. PMH significant for HTN, controlled T2DM not requiring insulin, CKD 4, anemia of CKD, CVA, remote h/o unprovoked DVT x 2, HLD.   Labs: glucose 125, BUN 49, Creatinine 2.57, GFR 16%/19%, Hgb 9.4, Hct 28.2% Meds: novolog at bedtime and TID with meal, lantus 5 units daily, coumadin daily, LR bolus 750 mL on 12/20  Per chart, over past 1 year, pt wt ranges 53.4 - 63.5 kg. 60.8 kg in Oct 2019 --> 12% wt loss in 3 months is significant.  Pt resting in bed with son present at time of visit.  Pt reports a good, normal appetite. Typically, pt eats whenever she feels hungry throughout the day, eating at least 3-4 small meals daily. Does not follow any special diet or take vitamin/mineral supplements at home.   States 117# sounds accurate for her wt. Son thinks she has gained some weight recently. Pt lives independently and has lots of family support.   Denies nausea or vomiting, diarrhea or constipation, or difficulty chewing or swallowing.   Encouraged pt to include protein-rich foods with all meals and snacks, and to eat those foods first if not feeling hungry. Pt amenable to Ensure Enlive BID in chocolate. Brought her one this morning.  NUTRITION - FOCUSED PHYSICAL EXAM:   Most Recent Value  Orbital Region  Moderate depletion  Upper Arm Region  Severe depletion   Thoracic and Lumbar Region  Severe depletion  Buccal Region  Moderate depletion  Temple Region  Severe depletion  Clavicle Bone Region  Moderate depletion  Clavicle and Acromion Bone Region  Unable to assess  Scapular Bone Region  Moderate depletion  Dorsal Hand  Severe depletion  Patellar Region  Severe depletion  Anterior Thigh Region  Severe depletion  Posterior Calf Region  Severe depletion  Edema (RD Assessment)  None  Hair  Reviewed  Eyes  Reviewed  Mouth  Reviewed  Skin  Reviewed  Nails  Reviewed     Diet Order:  100% lunch and dinner completed on 12/18, 75-100% lunch and dinner completed on 12/19, per nsg documentation Diet Order            Diet regular Room service appropriate? Yes; Fluid consistency: Thin  Diet effective now              EDUCATION NEEDS:  No education needs have been identified at this time  Skin:  Skin Assessment: Skin Integrity Issues: Skin Integrity Issues:: Other (Comment) Other: abrasion on arm  Last BM:  12/18, type 5  Height:  Ht Readings from Last 1 Encounters:  07/11/18 5\' 4"  (1.626 m)    Weight:  Wt Readings from Last 1 Encounters:  07/12/18 53.4 kg    Ideal Body Weight:  54.5 kg  BMI:  Body mass index is 20.21 kg/m.  Estimated Nutritional Needs:   Kcal:  1338 calories daily (  MSJ x 1.4)  Protein:  65-82 gm daily (1.2-1.5 g/kg IBW)  Fluid:  >/= 1.3 L daily or per MD discretion  Althea Grimmer, MS, RDN, LDN Pager: [phone]

## 2018-07-15 DIAGNOSIS — Y92009 Unspecified place in unspecified non-institutional (private) residence as the place of occurrence of the external cause: Secondary | ICD-10-CM

## 2018-07-15 DIAGNOSIS — I10 Essential (primary) hypertension: Secondary | ICD-10-CM

## 2018-07-15 DIAGNOSIS — W19XXXA Unspecified fall, initial encounter: Secondary | ICD-10-CM

## 2018-07-15 DIAGNOSIS — N184 Chronic kidney disease, stage 4 (severe): Secondary | ICD-10-CM

## 2018-07-15 DIAGNOSIS — E1122 Type 2 diabetes mellitus with diabetic chronic kidney disease: Secondary | ICD-10-CM

## 2018-07-15 DIAGNOSIS — E1159 Type 2 diabetes mellitus with other circulatory complications: Secondary | ICD-10-CM

## 2018-07-15 LAB — GLUCOSE, CAPILLARY
Glucose-Capillary: 130 mg/dL — ABNORMAL HIGH (ref 70–99)
Glucose-Capillary: 138 mg/dL — ABNORMAL HIGH (ref 70–99)
Glucose-Capillary: 79 mg/dL (ref 70–99)
Glucose-Capillary: 162 mg/dL — ABNORMAL HIGH (ref 70–99)
Glucose-Capillary: 116 mg/dL — ABNORMAL HIGH (ref 70–99)

## 2018-07-15 NOTE — Progress Notes (Signed)
   Subjective: HD #4  Overnight: No acute events reported  Today, Chelsea Mcdaniel is doing well and denies any complaints. She was found eating breakfast.   Objective:  Vital signs in last 24 hours: Vitals:   07/14/18 0936 07/14/18 1651 07/14/18 2057 07/15/18 0652  BP: (!) 172/56 (!) 161/62 (!) 182/65 (!) 190/78  Pulse: 71 70 86 91  Resp: 18 18 18    Temp: 97.7 F (36.5 C) (!) 97.5 F (36.4 C) 98 F (36.7 C) (!) 97.5 F (36.4 C)  TempSrc: Oral Oral Oral Oral  SpO2: 98% 98% 94% 100%  Weight:      Height:       Const: In NAD, sitting on bed comfortably, in good mood CV: RRR.  No murmurs, gallops, rubs  Assessment/Plan:  Principal Problem:   Fall at home Active Problems:   Type 2 diabetes mellitus ([place])   Hypertension associated with diabetes ([place])   Delirium   Protein-calorie malnutrition, severe  Chelsea Mcdaniel is a very pleasant 84 year old African-American woman with hypertension, type 2 diabetes mellitus, CKD stage IV, anemia of chronic disease, previous stroke, unprovoked DVT on Coumadin who presented to the emergency department on 07/11/2018 with unwitnessed fall and altered mental status.  Altered mental status:  Continues to improve daily with no reports of delirium. Differential include polypharmacy vs delirium vs cognitive decline.  -Delirium precaution -PT/OT to follow. -Stable to discharge to SNF once bed becomes available  Unwitnessed fall secondary to orthostatic hypotension:  Encourage hydration and is s/p IVF  -PT/OT to follow -Stable to discharge to SNF once bed becomes available  Anemia of chronic disease:Stable  Type 2 diabetes mellitus:Continue Lantus 5 units qhs, SSI w/ bedtime correction, NovoLog TID w/ meals, Tradjenta 2.5 mg daily.  CKD stage IV: Stable.  -Continue to monitor  Hypertension:  Continue amlodipine 5 mg daily, clonidine 0.2 mg patch, hydralazine 25 mgTID.  BP goal 150/90 -Home antihypertensives include furosemide 80 mg daily,  amlodipine 5 mg daily, hydralazine 100 mgTID, clonidine patch 0.2 mg  External hemorrhoids:Continue Tucks pads  Gout:continue allopurinol  Transient left bundle branch block: This was initially found on EKG on admission.  Repeat EKG was unremarkable.  FEN: Replace electrolytes as needed, regular diet VTE prophylaxis: Warfarin CODE STATUS: DNR  Disposition: Pending SNF placement ([place]).   Katherine Roan, MD 07/15/2018, 8:16 AM Pager: [phone] IMTS PGY-1

## 2018-07-15 NOTE — Progress Notes (Signed)
  Date: 07/15/2018  Patient name: Chelsea Mcdaniel  Medical record number: [id number]  Date of birth: 06-08-1928   I have seen and evaluated this patient and I have discussed the plan of care with the house staff. Please see Dr. Nelia Shi note for complete details. I concur with his findings.    Sid Falcon, MD 07/15/2018, 10:08 AM

## 2018-07-15 NOTE — Progress Notes (Signed)
07/15/18  Still awaiting insurance authorization from the [facility]. CSW will continue to follow.   Domenic Schwab, MSW, [place]

## 2018-07-16 LAB — GLUCOSE, CAPILLARY
Glucose-Capillary: 122 mg/dL — ABNORMAL HIGH (ref 70–99)
Glucose-Capillary: 171 mg/dL — ABNORMAL HIGH (ref 70–99)
Glucose-Capillary: 116 mg/dL — ABNORMAL HIGH (ref 70–99)
Glucose-Capillary: 146 mg/dL — ABNORMAL HIGH (ref 70–99)

## 2018-07-16 MED ORDER — HYDROCORTISONE 1 % EX CREA
1.0000 "application " | TOPICAL_CREAM | Freq: Three times a day (TID) | CUTANEOUS | Status: DC | PRN
  Filled 2018-07-16: qty 28

## 2018-07-16 MED ORDER — WHITE PETROLATUM EX OINT
TOPICAL_OINTMENT | CUTANEOUS | Status: AC
  Administered 2018-07-16: 18:00:00
  Filled 2018-07-16: qty 28.35

## 2018-07-16 NOTE — Progress Notes (Signed)
  Date: 07/16/2018  Patient name: Chelsea Mcdaniel  Medical record number: [id number]  Date of birth: 1927-12-13   This patient's plan of care was discussed with the house staff. Please see their note for complete details. I concur with their findings.   Sid Falcon, MD 07/16/2018, 3:30 PM

## 2018-07-16 NOTE — Progress Notes (Signed)
   Subjective: Today, Chelsea Mcdaniel  was resting when I arrived and woke up in a good mood as always.  She states she is doing well and denies any complaints.  Objective:  Vital signs in last 24 hours: Vitals:   07/15/18 1640 07/15/18 2045 07/16/18 0642 07/16/18 0944  BP: 136/67 (!) 148/58 (!) 189/65 (!) 165/66  Pulse: 82 78 79 78  Resp: 16 17 19 18   Temp: 98 F (36.7 C) 98.2 F (36.8 C) 98 F (36.7 C) 98.2 F (36.8 C)  TempSrc: Oral Oral Oral Oral  SpO2: 100% 98% 100% 100%  Weight:  53.5 kg    Height:       Const: In NAD, sitting on bed comfortably, in good mood CV: RRR.  No murmurs, gallops, rubs Pulm: CTAB Extremities: no LE edema  Assessment/Plan:  Principal Problem:   Fall at home Active Problems:   Type 2 diabetes mellitus ([place])   Hypertension associated with diabetes ([place])   Delirium   Protein-calorie malnutrition, severe  Chelsea Mcdaniel is a very pleasant 84 year old African-American woman with hypertension, type 2 diabetes mellitus, CKD stage IV, anemia of chronic disease, previous stroke, unprovoked DVT on Coumadin who presented to the emergency department on 07/11/2018 with unwitnessed fall and altered mental status.  Altered mental status:  Continues to improve daily with no reports of delirium. Differential include polypharmacy vs delirium vs cognitive decline.  -Delirium precaution -PT/OT to follow. -At her baseline, stable to discharge to SNF once bed becomes available  Unwitnessed fall secondary to orthostatic hypotension:  Encourage hydration and is s/p IVF  -PT/OT to follow -She remains clinically stable to discharge to SNF once bed becomes available  Anemia of chronic disease:Stable  Type 2 diabetes mellitus:Continue Lantus 5 units qhs, SSI w/ bedtime correction, NovoLog TID w/ meals, Tradjenta 2.5 mg daily.  CKD stage IV: Stable.  -Continue to monitor  Hypertension:  Continue amlodipine 5 mg daily, clonidine 0.2 mg patch, hydralazine 25 mgTID.   Have tapered back some of her regimen   -BP goal ~150/90, she is close to goal   External hemorrhoids:Continue Tucks pads  Gout:continue allopurinol  Transient left bundle branch block: This was initially found on EKG on admission.  Repeat EKG was unremarkable.  FEN: Replace electrolytes as needed, regular diet VTE prophylaxis: Warfarin CODE STATUS: DNR  Disposition: Pending SNF placement (Chelsea Mcdaniel).   Chelsea Roan, MD 07/16/2018, 9:58 AM

## 2018-07-17 DIAGNOSIS — Z743 Need for continuous supervision: Secondary | ICD-10-CM | POA: Diagnosis not present

## 2018-07-17 DIAGNOSIS — I951 Orthostatic hypotension: Secondary | ICD-10-CM | POA: Diagnosis not present

## 2018-07-17 DIAGNOSIS — I110 Hypertensive heart disease with heart failure: Secondary | ICD-10-CM | POA: Diagnosis not present

## 2018-07-17 DIAGNOSIS — M6281 Muscle weakness (generalized): Secondary | ICD-10-CM | POA: Diagnosis not present

## 2018-07-17 DIAGNOSIS — H409 Unspecified glaucoma: Secondary | ICD-10-CM | POA: Diagnosis not present

## 2018-07-17 DIAGNOSIS — E119 Type 2 diabetes mellitus without complications: Secondary | ICD-10-CM | POA: Diagnosis not present

## 2018-07-17 DIAGNOSIS — R296 Repeated falls: Secondary | ICD-10-CM | POA: Diagnosis not present

## 2018-07-17 DIAGNOSIS — Z741 Need for assistance with personal care: Secondary | ICD-10-CM | POA: Diagnosis not present

## 2018-07-17 DIAGNOSIS — I959 Hypotension, unspecified: Secondary | ICD-10-CM | POA: Diagnosis not present

## 2018-07-17 DIAGNOSIS — I447 Left bundle-branch block, unspecified: Secondary | ICD-10-CM | POA: Diagnosis not present

## 2018-07-17 DIAGNOSIS — H547 Unspecified visual loss: Secondary | ICD-10-CM | POA: Diagnosis not present

## 2018-07-17 DIAGNOSIS — R279 Unspecified lack of coordination: Secondary | ICD-10-CM | POA: Diagnosis not present

## 2018-07-17 DIAGNOSIS — I10 Essential (primary) hypertension: Secondary | ICD-10-CM | POA: Diagnosis not present

## 2018-07-17 DIAGNOSIS — M109 Gout, unspecified: Secondary | ICD-10-CM | POA: Diagnosis not present

## 2018-07-17 LAB — GLUCOSE, CAPILLARY
Glucose-Capillary: 116 mg/dL — ABNORMAL HIGH (ref 70–99)
Glucose-Capillary: 162 mg/dL — ABNORMAL HIGH (ref 70–99)
Glucose-Capillary: 169 mg/dL — ABNORMAL HIGH (ref 70–99)

## 2018-07-17 LAB — PROTIME-INR
INR: 2
Prothrombin Time: 22.4 seconds — ABNORMAL HIGH (ref 11.4–15.2)

## 2018-07-17 NOTE — Progress Notes (Signed)
Physical Therapy Treatment Patient Details Name: Chelsea Mcdaniel MRN: [id number] DOB: 1928-02-05 Today's Date: 07/17/2018    History of Present Illness Chelsea Mcdaniel is a pleasant 84 year old woman with hypertension, controlled type 2 diabetes mellitus not requiring insulin, CKD 4, anemia of ckd, CVA, remote hx of unprovoked DVT x 2 ( on coumadin).  She is seen in the ED after presenting for a fall.  She does not remember the events from earlier today so this history was obtained from chart review and ED provider report.  EMS arrived at her home this morning after receiving alert that she had activated her life alert alarm.  She was found on the floor in her home.    PT Comments    Pt received in recliner, very pleasant and agreeable to participation in therapy. She required min assist transfers and min assist ambulation 150 feet with RW. Pt positioned in recliner with feet elevated at end of session. Current POC remains appropriate.    Follow Up Recommendations  SNF;Supervision/Assistance - 24 hour     Equipment Recommendations  None recommended by PT    Recommendations for Other Services       Precautions / Restrictions Precautions Precautions: Fall;Other (comment) Precaution Comments: orthostatic    Mobility  Bed Mobility               General bed mobility comments: Pt received in recliner.  Transfers Overall transfer level: Needs assistance Equipment used: Rolling walker (2 wheeled) Transfers: Sit to/from Stand Sit to Stand: Min assist         General transfer comment: cues for hand placement. Assist to power up  Ambulation/Gait Ambulation/Gait assistance: Min assist Gait Distance (Feet): 150 Feet Assistive device: Rolling walker (2 wheeled) Gait Pattern/deviations: Step-through pattern;Decreased stride length;Drifts right/left Gait velocity: decreased Gait velocity interpretation: <1.31 ft/sec, indicative of household ambulator General Gait Details: assist  for balance and safety   Stairs             Wheelchair Mobility    Modified Rankin (Stroke Patients Only)       Balance Overall balance assessment: Needs assistance;History of Falls Sitting-balance support: No upper extremity supported;Feet supported Sitting balance-Leahy Scale: Good     Standing balance support: Bilateral upper extremity supported;During functional activity Standing balance-Leahy Scale: Poor Standing balance comment: reliant on UE support                             Cognition Arousal/Alertness: Awake/alert Behavior During Therapy: WFL for tasks assessed/performed Overall Cognitive Status: Impaired/Different from baseline Area of Impairment: Orientation;Attention;Memory;Following commands;Safety/judgement;Problem solving                 Orientation Level: Time Current Attention Level: Selective Memory: Decreased short-term memory Following Commands: Follows one step commands consistently Safety/Judgement: Decreased awareness of deficits;Decreased awareness of safety   Problem Solving: Difficulty sequencing;Requires verbal cues        Exercises      General Comments        Pertinent Vitals/Pain Pain Assessment: No/denies pain    Home Living                      Prior Function            PT Goals (current goals can now be found in the care plan section) Acute Rehab PT Goals Patient Stated Goal: to go home PT Goal Formulation: With patient Time For Goal Achievement:  07/25/18 Potential to Achieve Goals: Good Progress towards PT goals: Progressing toward goals    Frequency    Min 2X/week      PT Plan Current plan remains appropriate    Co-evaluation              AM-PAC PT "6 Clicks" Mobility   Outcome Measure  Help needed turning from your back to your side while in a flat bed without using bedrails?: A Little Help needed moving from lying on your back to sitting on the side of a flat bed  without using bedrails?: A Little Help needed moving to and from a bed to a chair (including a wheelchair)?: A Little Help needed standing up from a chair using your arms (e.g., wheelchair or bedside chair)?: A Little Help needed to walk in hospital room?: A Little Help needed climbing 3-5 steps with a railing? : A Lot 6 Click Score: 17    End of Session Equipment Utilized During Treatment: Gait belt Activity Tolerance: Patient tolerated treatment well Patient left: in chair;with call bell/phone within reach;with chair alarm set Nurse Communication: Mobility status PT Visit Diagnosis: Other abnormalities of gait and mobility (R26.89);History of falling (Z91.81);Muscle weakness (generalized) (M62.81)     Time: 4035-2481 PT Time Calculation (min) (ACUTE ONLY): 13 min  Charges:  $Gait Training: 8-22 mins                     Lorrin Goodell, PT  Office # [phone] Pager [phone]    Lorriane Shire 07/17/2018, 3:40 PM

## 2018-07-17 NOTE — Care Management Important Message (Signed)
Important Message  Patient Details  Name: Chelsea Mcdaniel MRN: [id number] Date of Birth: Oct 28, 1927   Medicare Important Message Given:  Yes  Due to illness patient did not sign.  Unsigned left   Larell Baney 07/17/2018, 4:28 PM

## 2018-07-17 NOTE — Progress Notes (Signed)
   Subjective: HD#6  Overnight: No acute events reported  Today, Chelsea reported that she is doing well and denies any complaints.  She is well aware that we are pending insurance authorization for SNF placement.  Objective:  Vital signs in last 24 hours: Vitals:   07/16/18 1641 07/16/18 2111 07/17/18 0513 07/17/18 0834  BP: (!) 129/47 (!) 159/61 (!) 178/79 (!) 161/53  Pulse: 67 76 72 73  Resp: 18 18 18 18   Temp: 99.1 F (37.3 C) 98.2 F (36.8 C) 98.4 F (36.9 C) 98.6 F (37 C)  TempSrc: Oral   Oral  SpO2: 97% 95% 97% 98%  Weight:  53.5 kg    Height:       Constitutional: In no apparent distress, lying comfortably in bed. Cardiovascular: RRR, no murmurs, gallops, rubs  Assessment/Plan:  Principal Problem:   Fall at home Active Problems:   Type 2 diabetes mellitus ([place])   Hypertension associated with diabetes ([place])   Delirium   Protein-calorie malnutrition, severe  Chelsea Mcdaniel is a very pleasant 84 year old African-American woman with hypertension, type 2 diabetes mellitus, CKD stage IV, anemia of chronic disease, previous stroke, unprovoked DVT on Coumadin who presented to the emergency department on 07/11/2018 with unwitnessed fall and altered mental status.  Altered mental status:Stable.  Very pleasant and conversational. - Discharging to [facility] today  Unwitnessed fall secondary to orthostatic hypotension:   She improved with IV fluids and oral hydration.  In addition, her blood pressure medications were adjusted. - Discharging to [facility] today  Anemia of chronic disease:Stable  Type 2 diabetes mellitus:Continue Lantus 5 units qhs, SSI w/ bedtime correction, NovoLog TID w/ meals, Tradjenta 2.5 mg daily.  CKD stage JS:EGBTDV.  -Continue to monitor  Hypertension: Continue amlodipine5mg  daily, clonidine 0.2 mg patch, hydralazine 25mg TID.    Her blood pressure goal is 150/90.  External hemorrhoids:Continue Tucks pads  Gout:continue  allopurinol  Transient left bundle branch block: This was initially found on EKG on admission.  Repeat EKG was unremarkable.  FEN: Replace electrolytes as needed, regular diet VTE prophylaxis: Warfarin CODE STATUS: DNR  Disposition:Discharged to [facility] today.  [name], MD 07/17/2018, 2:02 PM Pager: [phone] IMTS PGY-1

## 2018-07-17 NOTE — Clinical Social Work Placement (Signed)
   CLINICAL SOCIAL WORK PLACEMENT  NOTE 07/16/18 - DISCHARGED TO [facility] VIA AMBULANCE  Date:  07/17/2018  Patient Details  Name: Chelsea Mcdaniel MRN: [id number] Date of Birth: 1927/11/08  Clinical Social Work is seeking post-discharge placement for this patient at the [place] level of care (*CSW will initial, date and re-position this form in  chart as items are completed):  Yes   Patient/family provided with [place] Work Department's list of facilities offering this level of care within the geographic area requested by the patient (or if unable, by the patient's family).  Yes   Patient/family informed of their freedom to choose among providers that offer the needed level of care, that participate in Medicare, Medicaid or managed care program needed by the patient, have an available bed and are willing to accept the patient.  Yes   Patient/family informed of [place]'s ownership interest in [facility] and [facility], as well as of the fact that they are under no obligation to receive care at these facilities.  PASRR submitted to EDS on 07/12/18     PASRR number received on 07/12/18     Existing PASRR number confirmed on       FL2 transmitted to all facilities in geographic area requested by pt/family on 07/12/18     FL2 transmitted to all facilities within larger geographic area on       Patient informed that his/her managed care company has contracts with or will negotiate with certain facilities, including the following:        Yes   Patient/family informed of bed offers received.  Patient chooses bed at [facility]     Physician recommends and patient chooses bed at      Patient to be transferred to [facility] authorization received - [facility] Medicare) on 07/17/18.  Patient to be transferred to facility by Ambulance     Patient family notified on 07/17/18 of transfer.  Name of family member notified:   Son and daughter in-law, Milus Banister and Santa Claus      Additional Comment:  07/17/18 - [place] received Josem Kaufmann. - #[phone]   _______________________________________________ Sable Feil, LCSW 07/17/2018, 6:04 PM

## 2018-07-17 NOTE — Discharge Summary (Signed)
Name: Chelsea Mcdaniel MRN: [id number] DOB: 1927/12/29 84 y.o. PCP: [name], MD  Date of Admission: 07/11/2018  1:21 AM Date of Discharge:  Attending Physician: Oda Kilts, MD  Discharge Diagnosis: 1.  Altered mental status 2.  Unwitnessed fall 3.  New left bundle branch block 4.  Anemia of chronic disease 5.  Type 2 diabetes mellitus 6.  CKD4 7.  External hemorrhoids 8.  Gout  Discharge Medications: Allergies as of 07/17/2018      Reactions   Diltiazem Other (See Comments)   Symptomatic bradaycardia   Metoprolol Other (See Comments)   Symptomatic bradycardia      Medication List    TAKE these medications   ACCU-CHEK AVIVA PLUS w/Device Kit Check blood sugar 1 time a day   ACCU-CHEK SOFTCLIX LANCET DEV Kit Use to check blood sugar one time a day   ACCU-CHEK SOFTCLIX LANCETS lancets Used to check blood sugar 3 times daily. Dx code E11.8   allopurinol 100 MG tablet Commonly known as:  ZYLOPRIM Take 1 tablet (100 mg total) by mouth daily.   amLODipine 5 MG tablet Commonly known as:  NORVASC Take 5 mg by mouth daily.   cloNIDine 0.2 mg/24hr patch Commonly known as:  CATAPRES - Dosed in mg/24 hr Place 1 patch (0.2 mg total) onto the skin once a week.   dorzolamide-timolol 22.3-6.8 MG/ML ophthalmic solution Commonly known as:  COSOPT Place 1 drop into both eyes 2 (two) times daily.   furosemide 10 MG/ML solution Commonly known as:  LASIX Take 8 mLs (80 mg total) by mouth daily as needed for edema. What changed:    when to take this  reasons to take this   glucose blood test strip Commonly known as:  ACCU-CHEK AVIVA Use as instructed   glucose blood test strip Commonly known as:  ACCU-CHEK AVIVA PLUS USE 1 TIME DAILY TO CHECK BLOOD SUGAR   hydrALAZINE 10 MG tablet Commonly known as:  APRESOLINE Take 1 tablet (10 mg total) by mouth 3 (three) times daily. What changed:    medication strength  how much to take   JANUVIA 25 MG  tablet Generic drug:  sitaGLIPtin TAKE 1 TABLET BY MOUTH EVERY DAY What changed:  how much to take   LUMIGAN 0.01 % Soln Generic drug:  bimatoprost Place 1 drop into both eyes at bedtime.   polyethylene glycol packet Commonly known as:  MIRALAX Take 17 g by mouth 2 (two) times daily.   warfarin 5 MG tablet Commonly known as:  COUMADIN Take as directed. If you are unsure how to take this medication, talk to your nurse or doctor. Original instructions:  TAKE 1 TABLET BY MOUTH EVERY DAY AT 6 PM EXCEPT WEDNESDAY AND FRIDAY TAKE 1 & 1/2 TABLETS ON THOSE DAYS What changed:    See the new instructions.  Another medication with the same name was removed. Continue taking this medication, and follow the directions you see here.       Disposition and follow-up:   [name] was discharged from [facility] in [place] condition.  At the hospital follow up visit please address:  1.  Unwitnessed fall, Hypertension: Most likely secondary to orthostatic hypotension.  Please ensure adequate hydration and consider titrating down blood pressure medications. (Was maintained on amlodipine 5 mg daily, clonidine patch 0.2 mg and hydralazine 25 mg TID with good control).  With her age, BP goal is 150/90  Discharged blood pressure meds: - Amlodipine 5  mg daily - Clonidine patch 0.2 mg - Hydralazine 10 mg TID (previously on hydralazine 100 mg TID) - Lasix 80 mg prn lower extremity swelling  New left bundle branch block: Work-up was negative for acute coronary syndrome.  Repeat EKG was unremarkable.  AMS: Unclear etiology.  Delirium vs concussion vs ? Alzheimer's dementia  2.  Labs / imaging needed at time of follow-up: CMP  3.  Pending labs/ test needing follow-up: Urine culture.  Follow-up Appointments:   Hospital Course by problem list: 1.  Altered mental status: [name] is a very pleasant 84 year old African-American woman with hypertension, type 2 diabetes mellitus,  CKD stage IV, anemia of chronic disease, previous stroke, unprovoked DVT on Coumadin who presented to the emergency department on 07/11/2018 with unwitnessed fall and altered mental status.  On hospital day 1, she was found to be delirious though not combative or agitated.  She did not have any further episodes of confusion, delirium or altered mental status. Differential diagnosis for her AMS includes delirium, Alzheimer's dementia though she does not carry an official diagnosis, infectious causes such as UTI though she denies dysuria or urinary urgency or frequency and remained afebrile without leukocytosis, polypharmacy- not on any centrally acting medications, low suspicion for pneumonia, stroke also less likely she has no neurological deficits and also CT head was unremarkable, her electrolytes were unremarkable.  2.  Unwitnessed fall: [name] was found on the floor her son who called EMS.  She most likely had orthostatic hypotension which unfortunately resulted in her fall.  She had positive orthostatic vitals during this admission and oral rehydration was encouraged.   3.  New left bundle branch block: This was found on EKG that was obtained on admission.  Work-up for acute coronary syndrome was unremarkable.  She had a one-time episode of 3 runs of ventricular tachycardia on telemetry monitoring however she remained asymptomatic.  She has a long history of symptomatic bradycardia in the setting of beta-blocker and calcium channel blocker use which required admission.  Given her age, comorbidities and frailty, she would most likely not benefit from additional invasive work-up.  It is reassuring that repeat EKG was unremarkable.  4.  Type 2 diabetes mellitus: Ms. Pribble was maintained on Lantus 5 units at bedtime, sliding scale insulin with bedtime correction and Tradjenta 2.5 mg.  5.  Hypertension: Her blood pressure on admission was elevated with systolic in the 604V and diastolic in the 40J.  We  slowly started her on her home antihypertensives.  And was maintained on amlodipine 2.5 mg daily, clonidine patch 0.2 mg and hydralazine 25 mg 3 times daily.  Given her age and recent finding for orthostatic hypotension, I believe she will benefit from a moderate control of her blood pressure with goal of 150/90.    Discharge Vitals:   BP (!) 161/53 (BP Location: Right Arm)   Pulse 73   Temp 98.6 F (37 C) (Oral)   Resp 18   Ht '5\' 4"'$  (1.626 m)   Wt 53.5 kg   SpO2 98%   BMI 20.25 kg/m   Pertinent Labs, Studies, and Procedures:  CT HEAD:  1. LEFT scalp hematoma.  No acute intracranial process. 2. Old RIGHT frontoparietal and occipital lobe infarcts. Old LEFT basal ganglia and LEFT temporal infarcts. 3. Moderate to severe chronic small vessel ischemic changes.  CT CERVICAL SPINE:  1. No fracture or malalignment. 2. Extensive calcified pannus about the odontoid process seen with CPPD resulting in moderate canal stenosis. 3.  Severe C2-3 and C3-4 neural foraminal narrowing.  X-Ray Pelvis IMPRESSION: There is moderate osteoarthritic joint space loss of the right hip. No definite acute fracture is observed.  Discharge Instructions: Discharge Instructions    Call MD for:  difficulty breathing, headache or visual disturbances   Complete by:  As directed    Call MD for:  persistant dizziness or light-headedness   Complete by:  As directed    Call MD for:  persistant nausea and vomiting   Complete by:  As directed    Diet - low sodium heart healthy   Complete by:  As directed    Discharge instructions   Complete by:  As directed    Ms. Menard:  It was a pleasure taking care of you at the hospital.  You were admitted after you passed out.  We think this is because you were dehydrated which caused your blood pressure to drop.  We gave you some fluids and you improved.  Here are my recommendations after this hospital visit.  1.  I have made an appointment for you at the  internal medicine clinic on Friday, July 21, 2018 at 9:45 AM. 2.  I am decreasing your hydralazine to 10 mg 3 times a day.  3.  For your Lasix, I would like for you to take it as needed for your lower extremity swelling.  At least until you see [place] at the clinic next Friday. 4.  I would like for you to increase fluid intake  Take care and happy holidays. ~Dr. Eileen Stanford   Increase activity slowly   Complete by:  As directed       Signed: [name], MD 07/17/2018, 1:46 PM   Pager: [phone] IMTS PGY-1

## 2018-07-20 DIAGNOSIS — H547 Unspecified visual loss: Secondary | ICD-10-CM | POA: Diagnosis not present

## 2018-07-20 DIAGNOSIS — I10 Essential (primary) hypertension: Secondary | ICD-10-CM | POA: Diagnosis not present

## 2018-07-20 DIAGNOSIS — R296 Repeated falls: Secondary | ICD-10-CM | POA: Diagnosis not present

## 2018-07-21 ENCOUNTER — Other Ambulatory Visit: Payer: Self-pay

## 2018-07-21 ENCOUNTER — Other Ambulatory Visit: Payer: Self-pay | Admitting: *Deleted

## 2018-07-21 ENCOUNTER — Telehealth: Payer: Self-pay | Admitting: *Deleted

## 2018-07-21 ENCOUNTER — Encounter: Payer: Self-pay | Admitting: Internal Medicine

## 2018-07-21 ENCOUNTER — Ambulatory Visit (INDEPENDENT_AMBULATORY_CARE_PROVIDER_SITE_OTHER): Payer: Medicare Other | Admitting: Internal Medicine

## 2018-07-21 VITALS — BP 188/66 | HR 76 | Temp 97.6°F | Ht 63.0 in | Wt 124.5 lb

## 2018-07-21 DIAGNOSIS — E1159 Type 2 diabetes mellitus with other circulatory complications: Secondary | ICD-10-CM | POA: Diagnosis not present

## 2018-07-21 DIAGNOSIS — I10 Essential (primary) hypertension: Secondary | ICD-10-CM | POA: Diagnosis not present

## 2018-07-21 DIAGNOSIS — I447 Left bundle-branch block, unspecified: Secondary | ICD-10-CM

## 2018-07-21 DIAGNOSIS — M6281 Muscle weakness (generalized): Secondary | ICD-10-CM | POA: Diagnosis not present

## 2018-07-21 DIAGNOSIS — Z86718 Personal history of other venous thrombosis and embolism: Secondary | ICD-10-CM | POA: Diagnosis not present

## 2018-07-21 DIAGNOSIS — I152 Hypertension secondary to endocrine disorders: Secondary | ICD-10-CM

## 2018-07-21 DIAGNOSIS — F0150 Vascular dementia without behavioral disturbance: Secondary | ICD-10-CM

## 2018-07-21 DIAGNOSIS — R2689 Other abnormalities of gait and mobility: Secondary | ICD-10-CM | POA: Diagnosis not present

## 2018-07-21 DIAGNOSIS — Z741 Need for assistance with personal care: Secondary | ICD-10-CM | POA: Diagnosis not present

## 2018-07-21 DIAGNOSIS — F0390 Unspecified dementia without behavioral disturbance: Secondary | ICD-10-CM | POA: Diagnosis not present

## 2018-07-21 DIAGNOSIS — R410 Disorientation, unspecified: Secondary | ICD-10-CM | POA: Diagnosis not present

## 2018-07-21 DIAGNOSIS — R279 Unspecified lack of coordination: Secondary | ICD-10-CM | POA: Diagnosis not present

## 2018-07-21 DIAGNOSIS — E119 Type 2 diabetes mellitus without complications: Secondary | ICD-10-CM | POA: Diagnosis not present

## 2018-07-21 DIAGNOSIS — Z79899 Other long term (current) drug therapy: Secondary | ICD-10-CM

## 2018-07-21 DIAGNOSIS — F329 Major depressive disorder, single episode, unspecified: Secondary | ICD-10-CM

## 2018-07-21 DIAGNOSIS — M109 Gout, unspecified: Secondary | ICD-10-CM | POA: Diagnosis not present

## 2018-07-21 DIAGNOSIS — R296 Repeated falls: Secondary | ICD-10-CM | POA: Diagnosis not present

## 2018-07-21 DIAGNOSIS — I951 Orthostatic hypotension: Secondary | ICD-10-CM

## 2018-07-21 DIAGNOSIS — Z5181 Encounter for therapeutic drug level monitoring: Secondary | ICD-10-CM | POA: Diagnosis not present

## 2018-07-21 DIAGNOSIS — Z8673 Personal history of transient ischemic attack (TIA), and cerebral infarction without residual deficits: Secondary | ICD-10-CM

## 2018-07-21 DIAGNOSIS — D649 Anemia, unspecified: Secondary | ICD-10-CM | POA: Diagnosis not present

## 2018-07-21 DIAGNOSIS — I82501 Chronic embolism and thrombosis of unspecified deep veins of right lower extremity: Secondary | ICD-10-CM | POA: Diagnosis not present

## 2018-07-21 DIAGNOSIS — R609 Edema, unspecified: Secondary | ICD-10-CM | POA: Diagnosis not present

## 2018-07-21 DIAGNOSIS — Z7901 Long term (current) use of anticoagulants: Secondary | ICD-10-CM | POA: Diagnosis not present

## 2018-07-21 DIAGNOSIS — I69815 Cognitive social or emotional deficit following other cerebrovascular disease: Secondary | ICD-10-CM | POA: Diagnosis not present

## 2018-07-21 DIAGNOSIS — R011 Cardiac murmur, unspecified: Secondary | ICD-10-CM

## 2018-07-21 DIAGNOSIS — Z794 Long term (current) use of insulin: Secondary | ICD-10-CM | POA: Diagnosis not present

## 2018-07-21 DIAGNOSIS — Z9181 History of falling: Secondary | ICD-10-CM

## 2018-07-21 DIAGNOSIS — N184 Chronic kidney disease, stage 4 (severe): Secondary | ICD-10-CM | POA: Diagnosis not present

## 2018-07-21 DIAGNOSIS — I110 Hypertensive heart disease with heart failure: Secondary | ICD-10-CM | POA: Diagnosis not present

## 2018-07-21 DIAGNOSIS — H409 Unspecified glaucoma: Secondary | ICD-10-CM | POA: Diagnosis not present

## 2018-07-21 MED ORDER — SERTRALINE HCL 25 MG PO TABS: 25.0000 mg | ORAL_TABLET | Freq: Every day | ORAL | 3 refills | Status: DC

## 2018-07-21 NOTE — Assessment & Plan Note (Signed)
Patient was recently admitted December 17-23 after she sustained an unwitnessed fall at home and called life alert.  She was confused at the time of admission and not able to explain what led to the fall. She was found to have orthostatic hypotension which was thought to have contributed to her fall so her home antihypertensives doses were reduced.  She was discharged with amlodipine 5 mg daily, clonidine patch 0.2 mg and hydralazine 10 mg 3 times daily and Lasix 80 mg as needed for lower extremity swelling. She was also found to have a new left bundle branch block and acute coronary syndrome was ruled out. She did not take her medications this morning, shes used to taking them with a banana or applesauce and did not have either of these this morning. She denies symptoms headache, nausea, blurred vision.  - repeat orthostatic blood pressures were positive and she had some fatigue during testing however the degree of her hypertension limits further reduction in her antihypertensives. I have asked her to take her medications when she gets home and return in two weeks, if her blood pressure is under better control and the hypertension persist we can reduce one of the meds.

## 2018-07-21 NOTE — Progress Notes (Signed)
Internal Medicine Clinic Attending  Case discussed with Dr. Blum at the time of the visit.  We reviewed the resident's history and exam and pertinent patient test results.  I agree with the assessment, diagnosis, and plan of care documented in the resident's note. 

## 2018-07-21 NOTE — Progress Notes (Addendum)
CC: follow up after hospitalization for unwitnessed fall   HPI:  Chelsea Mcdaniel is a 84 y.o. with PMH as listed below who presents for follow up after hospitalization for unwitnessed fall . Please see the assessment and plans for the status of the patient chronic medical problems.   Past Medical History:  Diagnosis Date  . Anemia     normocytic anemia with baseline hemoglobin 10-11  . Blindness of left eye     likely related to stroke, left cataract removed from that eye with complications  . Calculus gallbladder and bile duct with cholecystitis with obstruction 01/2015  . Chronic kidney disease 2006    left renal artery stenosis with probable hemodynamic significance, kidneys are normal in morphology without focal lesions or hydronephrosis this is based but cannot A. of the abdomen with and without contrast done on the 31st 2006  . CKD (chronic kidney disease), stage III ([place])   . CVA (cerebral vascular accident) ([place]) 1990's  . CVA (cerebrovascular accident) [facility])  October 2007    CT of the head atrophy with multiple remote insults noted but no definite acute findings  . Glaucoma   . Gout   . Hyperlipidemia   . Hypertension   . Personal history of DVT (deep vein thrombosis) 08/11/2010   BLE  . Type II diabetes mellitus ([place])    Review of Systems:  Refer to history of present illness and assessment and plans for pertinent review of systems, all others reviewed and negative  Physical Exam:  Vitals:   07/21/18 0957  BP: (!) 188/66  Pulse: 76  Temp: 97.6 F (36.4 C)  TempSrc: Oral  SpO2: 100%  Weight: 124 lb 8 oz (56.5 kg)  Height: 5\' 3"  (1.6 m)  Lying down: 203/71 HR 76  Sitting:  190/73 HR 75  Standing: 181/77 HR 75  General: flat affect, not in acute distress  Cardiac: 3/6 systolic murmur loudest over RUSB,  Pulm: lungs are clear to auscultation  GI: in the seated position abdomen is soft, non tender, non distended Neuro: flat affect, no rigidity, oriented to  person and place ( she says were at the hospital) but not year or month   Assessment & Plan:   Fall secondary to orthostatic hypotension Patient was recently admitted December 17-23 after she sustained an unwitnessed fall at home and called life alert.  She was confused at the time of admission and not able to explain what led to the fall. She was found to have orthostatic hypotension which was thought to have contributed to her fall so her home antihypertensives doses were reduced.  She was discharged with amlodipine 5 mg daily, clonidine patch 0.2 mg and hydralazine 10 mg 3 times daily and Lasix 80 mg as needed for lower extremity swelling. She was also found to have a new left bundle branch block and acute coronary syndrome was ruled out. She did not take her medications this morning, shes used to taking them with a banana or applesauce and did not have either of these this morning. She denies symptoms headache, nausea, blurred vision.  - repeat orthostatic blood pressures were positive and she had some fatigue during testing however the degree of her hypertension limits further reduction in her antihypertensives. I have asked her to take her medications when she gets home and return in two weeks, if her blood pressure is under better control and the hypertension persist we can reduce one of the meds.   Dementia  Depression  Patient was found to be confused at the time of admission earlier this month. Today she is oriented to person and place but not time. Her family is with her and describes symptoms of dementia leading up to the hospitalization but they feel that her dementia has been slightly worse. They have noticed forgetfulness, for instance she forgets whether or not she took her medication or ate, she has been asking questions repeatedly, she has also been describing seeing children in her home. She has had slowing of her gait, her family attributes this to weakness. She had gotten to the point  where she wasn't able to do ADLs on her own - she needed assistance with cleaning, grocery shopping, cooking ( she was forgetting things on the stove), changing clothes, she uses adult breifs, family members had to pay the bills for her. She walked with a walker. She had an aid at her home 7 days per week, the aid has returned to her home and her daughter stayed with her yesterday evening. The aid is covered through her insurance. Since the admission she now needs help with a person supporting her while walking. PHQ 9 is significant today at 17. She has had multiple strokes in the past, dementia could be vascular. Will obtain TSH and B12 testing as screening for reversible causes. She denies symptoms of urinary frequency or dysuria.  - obtain follow up TSH, B12  - start sertraline 25 mg daily  - Referral to [facility] - referral to Home health PT and OT   See Encounters Tab for problem based charting.  Patient discussed with Dr. Angelia Mould

## 2018-07-21 NOTE — Patient Outreach (Signed)
[facility]) Care Management  07/21/2018  Chelsea Mcdaniel 1927-09-05 [id number]   Telephone Screen  Referral Date: 07/21/18 Referral Source: High risk of readmission-Stroke Ledell Noss, MD)   Referral Reason:needs help at home, high risk of readmission, [facility] evaluated during prior admission   Diagnoses of Stroke: Ischemic/TIAInsurance: united health care medicare     Outreach attempt # 1 No answer. [facility] RN CM left HIPAA compliant voicemail message along with CM's contact info.   Plan: [facility] RN CM sent an unsuccessful outreach letter and scheduled this patient for another call attempt within 4 business days  Vasil Juhasz L. Lavina Hamman, RN, BSN, [place] Coordinator Office number [phone] Mobile number [phone]  Main [facility] number [id number] Fax number [phone]

## 2018-07-21 NOTE — Telephone Encounter (Signed)
Call to [facility] to check on readmission of patient to Rehabilitation.  Spoke with front desk and then Linus Orn who talked with the Scientist, physiological at [employer]. Was made aware that patient had checked out AMA by Linus Orn and the front desk.  Tracey after speaking with the Administrator and checking the insurance process have agreed to readmit patient to [facility]. No further paperwork is needed from the Clinics.   Call to patient's cell number spoke with patient's daughter who will contact the patient's son and daughter- in-law to take patient back to the Facility.  Sander Nephew, RN 07/21/2018 12:08 PM.

## 2018-07-21 NOTE — Patient Instructions (Addendum)
Thank you for coming to the clinic today. It was a pleasure to see you.   For your high blood pressure, please take your medications:  Clonidine 0.2 mg patch  Amlodipine 5 mg daily  And Hydralazine 10 mg three times daily   For your depression start  Sertraline daily   FOLLOW-UP INSTRUCTIONS When: 2 weeks with your primary care doctor or in the acute care clinic   For: follow up of your hypertension  What to bring: all of your medication bottles   Please call the internal medicine center clinic if you have any questions or concerns, we may be able to help and keep you from a long and expensive emergency room wait. Our clinic and after hours phone number is [phone], the best time to call is Monday through Friday 9 am to 4 pm but there is always someone available 24/7 if you have an emergency. If you need medication refills please notify your pharmacy one week in advance and they will send [place] a request.

## 2018-07-21 NOTE — Assessment & Plan Note (Signed)
Patient was found to be confused at the time of admission earlier this month. Today she is oriented to person and place but not time. Her family is with her and describes symptoms of dementia leading up to the hospitalization but they feel that her dementia has been slightly worse. They have noticed forgetfulness, for instance she forgets whether or not she took her medication or ate, she has been asking questions repeatedly, she has also been describing seeing children in her home. She has had slowing of her gait, her family attributes this to weakness. She had gotten to the point where she wasn't able to do ADLs on her own - she needed assistance with cleaning, grocery shopping, cooking ( she was forgetting things on the stove), changing clothes, she uses adult breifs, family members had to pay the bills for her. She walked with a walker. She had an aid at her home 7 days per week, the aid has returned to her home and her daughter stayed with her yesterday evening. The aid is covered through her insurance. Since the admission she now needs help with a person supporting her while walking. PHQ 9 is significant today at 17. She has had multiple strokes in the past, dementia could be vascular. Will obtain TSH and B12 testing as screening for reversible causes. She denies symptoms of urinary frequency or dysuria.  - obtain follow up TSH, B12  - start sertraline 25 mg daily  - Referral to [facility] - referral to Home health PT and OT

## 2018-07-22 LAB — BMP8+ANION GAP
Anion Gap: 18 mmol/L (ref 10.0–18.0)
BUN / CREAT RATIO: 25 (ref 12–28)
BUN: 46 mg/dL — ABNORMAL HIGH (ref 8–27)
CO2: 18 mmol/L — ABNORMAL LOW (ref 20–29)
Calcium: 9 mg/dL (ref 8.7–10.3)
Chloride: 106 mmol/L (ref 96–106)
Creatinine, Ser: 1.81 mg/dL — ABNORMAL HIGH (ref 0.57–1.00)
GFR calc non Af Amer: 25 mL/min/{1.73_m2} — ABNORMAL LOW (ref >59)
GFR, EST AFRICAN AMERICAN: 28 mL/min/{1.73_m2} — AB (ref >59)
Glucose: 225 mg/dL — ABNORMAL HIGH (ref 65–99)
Potassium: 4.9 mmol/L (ref 3.5–5.2)
Sodium: 142 mmol/L (ref 134–144)

## 2018-07-22 LAB — VITAMIN B12: Vitamin B-12: 441 pg/mL (ref 232–1245)

## 2018-07-22 LAB — TSH: TSH: 3.12 u[IU]/mL (ref 0.450–4.500)

## 2018-07-24 ENCOUNTER — Other Ambulatory Visit: Payer: Self-pay | Admitting: *Deleted

## 2018-07-24 NOTE — Patient Outreach (Signed)
[facility]) Care Management  07/24/2018  Chelsea Mcdaniel November 10, 1927 [id number]   Telephone Screen  Referral Date: 07/21/18 Referral Source: High risk of readmission-Stroke Ledell Noss, MD)   Referral Reason:needs help at home, high risk of readmission, [facility] evaluated during prior admission   Diagnoses of Stroke: Ischemic/TIAInsurance: united health care medicare     Outreach attempt # 2 No answer. [facility] RN CM left HIPAA compliant voicemail message along with CM's contact info.   Plan: [facility] RN CM scheduled this patient for third/last call attempt within 4 business days  [name], RN, BSN, [place] Coordinator Office number [phone] Mobile number [phone]  Main [facility] number [phone] Fax number [phone]

## 2018-07-25 DIAGNOSIS — N184 Chronic kidney disease, stage 4 (severe): Secondary | ICD-10-CM | POA: Diagnosis not present

## 2018-07-25 DIAGNOSIS — R609 Edema, unspecified: Secondary | ICD-10-CM | POA: Diagnosis not present

## 2018-07-25 DIAGNOSIS — R296 Repeated falls: Secondary | ICD-10-CM | POA: Diagnosis not present

## 2018-07-27 ENCOUNTER — Other Ambulatory Visit: Payer: Self-pay | Admitting: *Deleted

## 2018-07-27 NOTE — Patient Outreach (Signed)
[facility]) Care Management  07/27/2018  LYDIA TOREN Oct 06, 1927 [id number]   Telephone Screen---------Telephonic [facility] RN CM Case closure   Referral Date:07/21/18 Referral Source:High risk of readmission-Stroke(nina Hetty Ely, MD)  Referral Reason:needs help at home, high risk of readmission, [facility] evaluated during prior admission   Diagnoses of Stroke: Ischemic/TIAInsurance:united health care medicare    Outreach attempt # 3 No answer at [phone]. [facility] RN CM left HIPAA compliant voicemail message along with CM's contact info.   Cm reached her daughter in law, Mrs Jahnay Lantier at [phone] who updated Cm that Mrs Whitcomb is presently being cared for at [facility] and rehabilitation   Plan: [place] will refer to and updated [place] SW to follow/monitor patient and engage for services prn upon discharge Telephonic [facility] RN CM will close Telephonic case at this time as Mrs Paugh is receiving external care management services at snf  routed note to [place]. Lavina Hamman, RN, BSN, [place] Coordinator Office number [phone] Mobile number [phone]  Main [facility] number [phone] Fax number [phone]

## 2018-07-28 ENCOUNTER — Encounter: Payer: Self-pay | Admitting: *Deleted

## 2018-07-28 DIAGNOSIS — Z7901 Long term (current) use of anticoagulants: Secondary | ICD-10-CM | POA: Diagnosis not present

## 2018-07-28 DIAGNOSIS — I82501 Chronic embolism and thrombosis of unspecified deep veins of right lower extremity: Secondary | ICD-10-CM | POA: Diagnosis not present

## 2018-07-28 DIAGNOSIS — N184 Chronic kidney disease, stage 4 (severe): Secondary | ICD-10-CM | POA: Diagnosis not present

## 2018-07-28 DIAGNOSIS — D649 Anemia, unspecified: Secondary | ICD-10-CM | POA: Diagnosis not present

## 2018-07-28 NOTE — Progress Notes (Signed)
Per [facility] phone note on 07/27/2018 patient is presently being cared for at [facility] and rehabilitation. No need for HH at this time. Hubbard Hartshorn, RN, BSN

## 2018-07-28 NOTE — Addendum Note (Signed)
Addended by: Hulan Fray on: 07/28/2018 06:19 PM   Modules accepted: Orders

## 2018-07-31 DIAGNOSIS — I10 Essential (primary) hypertension: Secondary | ICD-10-CM | POA: Diagnosis not present

## 2018-07-31 DIAGNOSIS — Z7901 Long term (current) use of anticoagulants: Secondary | ICD-10-CM | POA: Diagnosis not present

## 2018-07-31 DIAGNOSIS — D649 Anemia, unspecified: Secondary | ICD-10-CM | POA: Diagnosis not present

## 2018-07-31 DIAGNOSIS — I82501 Chronic embolism and thrombosis of unspecified deep veins of right lower extremity: Secondary | ICD-10-CM | POA: Diagnosis not present

## 2018-08-01 ENCOUNTER — Other Ambulatory Visit: Payer: Self-pay | Admitting: *Deleted

## 2018-08-01 DIAGNOSIS — E119 Type 2 diabetes mellitus without complications: Secondary | ICD-10-CM | POA: Diagnosis not present

## 2018-08-01 DIAGNOSIS — I10 Essential (primary) hypertension: Secondary | ICD-10-CM | POA: Diagnosis not present

## 2018-08-01 DIAGNOSIS — R609 Edema, unspecified: Secondary | ICD-10-CM | POA: Diagnosis not present

## 2018-08-01 NOTE — Patient Outreach (Signed)
[facility]) Care Management  08/01/2018  Chelsea Mcdaniel 1928-05-12 [id number]   Post Acute Care Consult  This social worker met with patient at [facility] [place]. Patient having lunch while sitting in her wheelchair when this social worker arrived. This Education officer, museum introduced self and explained the purpose of the visit. Patient oriented to person only. . She had difficulty recalling her birth date and address. Per patient, she does not remember events leading to her hospitalization and rehab stay. Per patient, "if they told me I could go home today, I would go".  Patient reports that she can take care of all of her in home needs and is sure that she will have  someone to check on her when she returns home. This social work encouraged patient to participate fully in therapy when offered.  This Education officer, museum spoke with discharge planner, Juanna Cao, who reported that patient left AMA with her son but was returned the next day. Patient's daughter in law Chelsea Mcdaniel now the contact person to discuss patient's care needs.. Per Dawn, patient showing minimal progress in therapy. She is unable to transfer or walk on her own. There is no anticipated discharge date yet.   Plan: This Education officer, museum will continue to follow patient's progress while at [facility].   Sheralyn Boatman [facility] Care Management [phone]

## 2018-08-03 DIAGNOSIS — R609 Edema, unspecified: Secondary | ICD-10-CM | POA: Diagnosis not present

## 2018-08-03 DIAGNOSIS — R296 Repeated falls: Secondary | ICD-10-CM | POA: Diagnosis not present

## 2018-08-03 DIAGNOSIS — D649 Anemia, unspecified: Secondary | ICD-10-CM | POA: Diagnosis not present

## 2018-08-03 DIAGNOSIS — Z7901 Long term (current) use of anticoagulants: Secondary | ICD-10-CM | POA: Diagnosis not present

## 2018-08-04 ENCOUNTER — Other Ambulatory Visit: Payer: Self-pay | Admitting: Pharmacist

## 2018-08-04 DIAGNOSIS — Z86718 Personal history of other venous thrombosis and embolism: Secondary | ICD-10-CM

## 2018-08-07 ENCOUNTER — Ambulatory Visit (INDEPENDENT_AMBULATORY_CARE_PROVIDER_SITE_OTHER): Payer: Medicare Other | Admitting: Pharmacist

## 2018-08-07 ENCOUNTER — Other Ambulatory Visit: Payer: Self-pay | Admitting: *Deleted

## 2018-08-07 ENCOUNTER — Encounter: Payer: Self-pay | Admitting: Pharmacist

## 2018-08-07 DIAGNOSIS — E119 Type 2 diabetes mellitus without complications: Secondary | ICD-10-CM | POA: Diagnosis not present

## 2018-08-07 DIAGNOSIS — Z5181 Encounter for therapeutic drug level monitoring: Secondary | ICD-10-CM

## 2018-08-07 DIAGNOSIS — Z7901 Long term (current) use of anticoagulants: Secondary | ICD-10-CM | POA: Diagnosis not present

## 2018-08-07 DIAGNOSIS — Z86718 Personal history of other venous thrombosis and embolism: Secondary | ICD-10-CM

## 2018-08-07 DIAGNOSIS — R296 Repeated falls: Secondary | ICD-10-CM | POA: Diagnosis not present

## 2018-08-07 DIAGNOSIS — Z794 Long term (current) use of insulin: Secondary | ICD-10-CM | POA: Diagnosis not present

## 2018-08-07 LAB — POCT INR: INR: 2.1 (ref 2.0–3.0)

## 2018-08-07 NOTE — Patient Outreach (Signed)
Social Work sent referral to Nursing.

## 2018-08-07 NOTE — Patient Outreach (Signed)
[facility]) Care Management  08/07/2018  DANISE DEHNE 1927/11/20 [id number]   Phone call to the Director of Social Work, [place] who confirmed that patient discharged from the SNF today. Per [place], patient discharged with [facility], however she was not sure of the agency name.    Plan: This Education officer, museum will send referral to [facility] for transition of care.    Sheralyn Boatman [facility] Care Management [phone]

## 2018-08-07 NOTE — Patient Instructions (Signed)
Patient instructed to take medications as defined in the Anti-coagulation Track section of this encounter.  Patient instructed to take today's dose.  Patient instructed to take 1 tablet by mouth once-daily at 6PM all days of week, EXCEPT on [facility] and FRIDAYS. Take only 1/2 tablet on [facility] and FRIDAYS of each week. Patient verbalized understanding of these instructions.

## 2018-08-07 NOTE — Progress Notes (Signed)
Anticoagulation Management Chelsea Mcdaniel is a 85 y.o. female who reports to the clinic for monitoring of warfarin treatment.    Indication: DVT, history of; Long term current use of anticoagulant.  Duration: indefinite Supervising physician: [facility] Visit History: Patient does not report signs/symptoms of bleeding or thromboembolism  Other recent changes: No diet, medications, lifestyle changes other than as noted in patient findings.  Anticoagulation Episode Summary    Current INR goal:   2.0-3.0  TTR:   73.8 % (4.5 y)  Next INR check:   09/04/2018  INR from last check:   2.1 (08/07/2018)  Weekly max warfarin dose:     Target end date:     INR check location:     Preferred lab:     Send INR reminders to:      Indications   EMBOLISM/THROMBOSIS DEEP VSL LWR EXTRM NOS (Resolved) [I82.409] Long-term (current) use of anticoagulants [Z79.01]       Comments:           Allergies  Allergen Reactions  . Diltiazem Other (See Comments)    Symptomatic bradaycardia  . Metoprolol Other (See Comments)    Symptomatic bradycardia   Prior to Admission medications   Medication Sig Start Date End Date Taking? Authorizing Provider  ACCU-CHEK SOFTCLIX LANCETS lancets Used to check blood sugar 3 times daily. Dx code E11.8 06/08/17  Yes Chundi, Verne Spurr, MD  allopurinol (ZYLOPRIM) 100 MG tablet Take 1 tablet (100 mg total) by mouth daily. 06/12/18 09/10/18 Yes Chundi, [name], MD  amLODipine (NORVASC) 5 MG tablet Take 5 mg by mouth daily. 06/20/18  Yes [provider]  Blood Glucose Monitoring Suppl (ACCU-CHEK AVIVA PLUS) w/Device KIT Check blood sugar 1 time a day 03/08/18  Yes Chundi, [name], MD  cloNIDine (CATAPRES - DOSED IN MG/24 HR) 0.2 mg/24hr patch Place 1 patch (0.2 mg total) onto the skin once a week. 02/28/18  Yes Chundi, [name], MD  dorzolamide-timolol (COSOPT) 22.3-6.8 MG/ML ophthalmic solution Place 1 drop into both eyes 2 (two) times daily.  05/16/18  Yes [provider]  furosemide (LASIX) 10 MG/ML solution Take 8 mLs (80 mg total) by mouth daily as needed for edema. 07/14/18  Yes Agyei, Obed K, MD  glucose blood (ACCU-CHEK AVIVA PLUS) test strip USE 1 TIME DAILY TO CHECK BLOOD SUGAR 09/03/17  Yes Chundi, [name], MD  glucose blood (ACCU-CHEK AVIVA) test strip Use as instructed 05/03/11 09/03/18 Yes Sharda, Neema K, MD  hydrALAZINE (APRESOLINE) 10 MG tablet Take 1 tablet (10 mg total) by mouth 3 (three) times daily. 07/14/18 08/13/18 Yes Agyei, Caprice Kluver, MD  JANUVIA 25 MG tablet TAKE 1 TABLET BY MOUTH EVERY DAY Patient taking differently: Take 25 mg by mouth daily.  06/12/18  Yes Chundi, Verne Spurr, MD  Lancets Misc. (ACCU-CHEK SOFTCLIX LANCET DEV) KIT Use to check blood sugar one time a day 09/03/17  Yes Chundi, [name], MD  LUMIGAN 0.01 % SOLN Place 1 drop into both eyes at bedtime.  12/05/13  Yes [provider]  polyethylene glycol (MIRALAX) packet Take 17 g by mouth 2 (two) times daily. 03/25/17  Yes [name], MD  sertraline (ZOLOFT) 25 MG tablet Take 1 tablet (25 mg total) by mouth daily. 07/21/18  Yes Ledell Noss, MD  warfarin (COUMADIN) 5 MG tablet TAKE 1 TABLET BY MOUTH EVERY DAY AT 6 PM EXCEPT WEDNESDAY AND FRIDAY TAKE 1 & 1/2 TABLETS ON THOSE DAYS Patient taking differently: Take 5-7.5 mg by mouth See admin  instructions. TAKE 1 TABLET BY MOUTH EVERY DAY AT 6 PM EXCEPT [facility] AND FRIDAY TAKE 1 & 1/2 TABLETS ON THOSE DAYS 07/10/18  Yes [name], MD   Past Medical History:  Diagnosis Date  . Anemia     normocytic anemia with baseline hemoglobin 10-11  . Blindness of left eye     likely related to stroke, left cataract removed from that eye with complications  . Calculus gallbladder and bile duct with cholecystitis with obstruction 01/2015  . Chronic kidney disease 2006    left renal artery stenosis with probable hemodynamic significance, kidneys are normal in morphology without focal lesions or  hydronephrosis this is based but cannot A. of the abdomen with and without contrast done on the 31st 2006  . CKD (chronic kidney disease), stage III ([place])   . CVA (cerebral vascular accident) ([place]) 1990's  . CVA (cerebrovascular accident) [facility])  October 2007    CT of the head atrophy with multiple remote insults noted but no definite acute findings  . Glaucoma   . Gout   . Hyperlipidemia   . Hypertension   . Personal history of DVT (deep vein thrombosis) 08/11/2010   BLE  . Type II diabetes mellitus ([place])    Social History   Socioeconomic History  . Marital status: Widowed    Spouse name: Not on file  . Number of children: Not on file  . Years of education: Not on file  . Highest education level: Not on file  Occupational History  . Not on file  Social Needs  . Financial resource strain: Not on file  . Food insecurity:    Worry: Not on file    Inability: Not on file  . Transportation needs:    Medical: Not on file    Non-medical: Not on file  Tobacco Use  . Smoking status: Never Smoker  . Smokeless tobacco: Never Used  Substance and Sexual Activity  . Alcohol use: No    Alcohol/week: 0.0 standard drinks  . Drug use: No  . Sexual activity: Never  Lifestyle  . Physical activity:    Days per week: Not on file    Minutes per session: Not on file  . Stress: Not on file  Relationships  . Social connections:    Talks on phone: Not on file    Gets together: Not on file    Attends religious service: Not on file    Active member of club or organization: Not on file    Attends meetings of clubs or organizations: Not on file    Relationship status: Not on file  Other Topics Concern  . Not on file  Social History Narrative    Patient is a widow. She has 11 children 5 of who are living. She is retired in 1993 from [employer]. She denies tobacco alcohol or drug use.   Family History  Problem Relation Age of Onset  . Heart disease Mother   . Diabetes Mother   .  Hyperlipidemia Mother   . Hypertension Mother   . Heart disease Father   . Diabetes Father   . Hyperlipidemia Father   . Hypertension Father     ASSESSMENT Recent Results: The most recent result is correlated with 30 mg per week: Lab Results  Component Value Date   INR 2.1 08/07/2018   INR 2.00 07/17/2018   INR 1.70 07/14/2018    Anticoagulation Dosing: Description   Take 1 tablet by mouth once-daily at [facility] all  days of week, EXCEPT on [facility] and FRIDAYS. Take only 1/2 tablet on [facility] and FRIDAYS of each week. Repeat this regimen weekly until seen next in anticoagulation management clinic.      INR today: Therapeutic  PLAN Weekly dose was unchanged. Dose will remain at 16m warfarin per week.   Patient Instructions  Patient instructed to take medications as defined in the Anti-coagulation Track section of this encounter.  Patient instructed to take today's dose.  Patient instructed to take 1 tablet by mouth once-daily at 6PM all days of week, [facility]. Take only [facility] each week. Patient verbalized understanding of these instructions.     Patient advised to contact clinic or seek medical attention if signs/symptoms of bleeding or thromboembolism occur.  Patient verbalized understanding by repeating back information and was advised to contact me if further medication-related questions arise. Patient was also provided an information handout.  Follow-up Return in 4 weeks (on 09/04/2018) for Follow up INR at 4:15PM.  JPennie Banter, PharmD, CPP 15 minutes spent face-to-face with the patient during the encounter. 50% of time spent on education. 50% of time was spent on fingerstick point of care INR sample collection, processing, results determination, and documentation in [email or web address]

## 2018-08-08 ENCOUNTER — Other Ambulatory Visit: Payer: Self-pay | Admitting: *Deleted

## 2018-08-08 NOTE — Patient Outreach (Signed)
[facility]) Care Management  08/08/2018  Inessa Wardrop [facility] [birth year] [id number]   Referral received from [place] as member was recently discharged from SNF for rehab after being admitted to hospital 12/17-12/23 for a fall.  Per chart, she has history of hypertension, diabetes, dementia, chronic kidney disease, and hyperlipidemia.  Per CSW note, her daughter in law [name] is the contact person, DPR noted to be on file.  Call placed to [facility] to initiate transition of care.  Member's identity verified, this care manager introduced self and stated purpose of call.  Attempted to explain [facility] services however she request brochure to be mailed to home to be reviewed .  She request follow up call afterwards to discuss options.    Will send [facility] brochure/packet and follow up within the next 4 business days.  [name], [place], MSN [place] [phone]

## 2018-08-09 DIAGNOSIS — I129 Hypertensive chronic kidney disease with stage 1 through stage 4 chronic kidney disease, or unspecified chronic kidney disease: Secondary | ICD-10-CM | POA: Diagnosis not present

## 2018-08-09 DIAGNOSIS — M6281 Muscle weakness (generalized): Secondary | ICD-10-CM | POA: Diagnosis not present

## 2018-08-09 DIAGNOSIS — I69398 Other sequelae of cerebral infarction: Secondary | ICD-10-CM | POA: Diagnosis not present

## 2018-08-09 DIAGNOSIS — H547 Unspecified visual loss: Secondary | ICD-10-CM | POA: Diagnosis not present

## 2018-08-09 DIAGNOSIS — Z7901 Long term (current) use of anticoagulants: Secondary | ICD-10-CM | POA: Diagnosis not present

## 2018-08-09 DIAGNOSIS — M109 Gout, unspecified: Secondary | ICD-10-CM | POA: Diagnosis not present

## 2018-08-09 DIAGNOSIS — Z86718 Personal history of other venous thrombosis and embolism: Secondary | ICD-10-CM | POA: Diagnosis not present

## 2018-08-09 DIAGNOSIS — M1611 Unilateral primary osteoarthritis, right hip: Secondary | ICD-10-CM | POA: Diagnosis not present

## 2018-08-09 DIAGNOSIS — Z9181 History of falling: Secondary | ICD-10-CM | POA: Diagnosis not present

## 2018-08-09 DIAGNOSIS — N184 Chronic kidney disease, stage 4 (severe): Secondary | ICD-10-CM | POA: Diagnosis not present

## 2018-08-09 DIAGNOSIS — I447 Left bundle-branch block, unspecified: Secondary | ICD-10-CM | POA: Diagnosis not present

## 2018-08-09 DIAGNOSIS — Z7984 Long term (current) use of oral hypoglycemic drugs: Secondary | ICD-10-CM | POA: Diagnosis not present

## 2018-08-09 DIAGNOSIS — E1122 Type 2 diabetes mellitus with diabetic chronic kidney disease: Secondary | ICD-10-CM | POA: Diagnosis not present

## 2018-08-09 DIAGNOSIS — D631 Anemia in chronic kidney disease: Secondary | ICD-10-CM | POA: Diagnosis not present

## 2018-08-10 DIAGNOSIS — M1611 Unilateral primary osteoarthritis, right hip: Secondary | ICD-10-CM | POA: Diagnosis not present

## 2018-08-10 DIAGNOSIS — H547 Unspecified visual loss: Secondary | ICD-10-CM | POA: Diagnosis not present

## 2018-08-10 DIAGNOSIS — Z86718 Personal history of other venous thrombosis and embolism: Secondary | ICD-10-CM | POA: Diagnosis not present

## 2018-08-10 DIAGNOSIS — Z7901 Long term (current) use of anticoagulants: Secondary | ICD-10-CM | POA: Diagnosis not present

## 2018-08-10 DIAGNOSIS — M109 Gout, unspecified: Secondary | ICD-10-CM | POA: Diagnosis not present

## 2018-08-10 DIAGNOSIS — E1122 Type 2 diabetes mellitus with diabetic chronic kidney disease: Secondary | ICD-10-CM | POA: Diagnosis not present

## 2018-08-10 DIAGNOSIS — M6281 Muscle weakness (generalized): Secondary | ICD-10-CM | POA: Diagnosis not present

## 2018-08-10 DIAGNOSIS — Z7984 Long term (current) use of oral hypoglycemic drugs: Secondary | ICD-10-CM | POA: Diagnosis not present

## 2018-08-10 DIAGNOSIS — D631 Anemia in chronic kidney disease: Secondary | ICD-10-CM | POA: Diagnosis not present

## 2018-08-10 DIAGNOSIS — I447 Left bundle-branch block, unspecified: Secondary | ICD-10-CM | POA: Diagnosis not present

## 2018-08-10 DIAGNOSIS — I69398 Other sequelae of cerebral infarction: Secondary | ICD-10-CM | POA: Diagnosis not present

## 2018-08-10 DIAGNOSIS — N184 Chronic kidney disease, stage 4 (severe): Secondary | ICD-10-CM | POA: Diagnosis not present

## 2018-08-10 DIAGNOSIS — Z9181 History of falling: Secondary | ICD-10-CM | POA: Diagnosis not present

## 2018-08-10 DIAGNOSIS — I129 Hypertensive chronic kidney disease with stage 1 through stage 4 chronic kidney disease, or unspecified chronic kidney disease: Secondary | ICD-10-CM | POA: Diagnosis not present

## 2018-08-11 ENCOUNTER — Telehealth: Payer: Self-pay

## 2018-08-11 ENCOUNTER — Other Ambulatory Visit: Payer: Self-pay | Admitting: *Deleted

## 2018-08-11 DIAGNOSIS — H547 Unspecified visual loss: Secondary | ICD-10-CM | POA: Diagnosis not present

## 2018-08-11 DIAGNOSIS — Z7901 Long term (current) use of anticoagulants: Secondary | ICD-10-CM | POA: Diagnosis not present

## 2018-08-11 DIAGNOSIS — Z86718 Personal history of other venous thrombosis and embolism: Secondary | ICD-10-CM | POA: Diagnosis not present

## 2018-08-11 DIAGNOSIS — M109 Gout, unspecified: Secondary | ICD-10-CM | POA: Diagnosis not present

## 2018-08-11 DIAGNOSIS — Z7984 Long term (current) use of oral hypoglycemic drugs: Secondary | ICD-10-CM | POA: Diagnosis not present

## 2018-08-11 DIAGNOSIS — E1122 Type 2 diabetes mellitus with diabetic chronic kidney disease: Secondary | ICD-10-CM | POA: Diagnosis not present

## 2018-08-11 DIAGNOSIS — N184 Chronic kidney disease, stage 4 (severe): Secondary | ICD-10-CM | POA: Diagnosis not present

## 2018-08-11 DIAGNOSIS — I69398 Other sequelae of cerebral infarction: Secondary | ICD-10-CM | POA: Diagnosis not present

## 2018-08-11 DIAGNOSIS — I447 Left bundle-branch block, unspecified: Secondary | ICD-10-CM | POA: Diagnosis not present

## 2018-08-11 DIAGNOSIS — Z9181 History of falling: Secondary | ICD-10-CM | POA: Diagnosis not present

## 2018-08-11 DIAGNOSIS — D631 Anemia in chronic kidney disease: Secondary | ICD-10-CM | POA: Diagnosis not present

## 2018-08-11 DIAGNOSIS — M6281 Muscle weakness (generalized): Secondary | ICD-10-CM | POA: Diagnosis not present

## 2018-08-11 DIAGNOSIS — M1611 Unilateral primary osteoarthritis, right hip: Secondary | ICD-10-CM | POA: Diagnosis not present

## 2018-08-11 DIAGNOSIS — I129 Hypertensive chronic kidney disease with stage 1 through stage 4 chronic kidney disease, or unspecified chronic kidney disease: Secondary | ICD-10-CM | POA: Diagnosis not present

## 2018-08-11 NOTE — Patient Outreach (Signed)
[facility]) Care Management  08/11/2018  JOSSLIN SANJUAN [birth year] [id number]   Call placed to [facility] caregiver, [name], as requested to follow up on [facility] information packet and decision to engage with program.  No answer, HIPAA compliant voice message left.  Will await call back.  If no call back, will follow up within the next 4 business days.  [name], [place], MSN [place] [phone]

## 2018-08-11 NOTE — Telephone Encounter (Signed)
Chelsea Mcdaniel with [facility] requesting VO. Please call pt back.

## 2018-08-11 NOTE — Telephone Encounter (Signed)
Lm for monique to rtc

## 2018-08-14 ENCOUNTER — Other Ambulatory Visit: Payer: Self-pay | Admitting: *Deleted

## 2018-08-14 NOTE — Patient Outreach (Signed)
[name]) Care Management  08/14/2018  Chelsea Mcdaniel 12-13-1927 [id number]   Call placed to daughter in law, [name] to follow up on review of [facility] information and decision to engage. She state [facility] has been providing assistance to member.  Difference between [facility] and [facility] explained.  She state their main concern at this time is to increase member's PCS hours.  Member receives 2.5 hours daily, 7 days a week.  Offered to have CSW contact them regarding community resources, [name] declined stating Education officer, museum with [name] has already started application process.  She again declines offer to engage with [facility].  Advised to contact this care manager should needs change.    Will close case at this time.  Will notify primary MD of case closure.  [name], [place], MSN [place] [phone]

## 2018-08-15 ENCOUNTER — Telehealth: Payer: Self-pay

## 2018-08-15 DIAGNOSIS — E1122 Type 2 diabetes mellitus with diabetic chronic kidney disease: Secondary | ICD-10-CM | POA: Diagnosis not present

## 2018-08-15 DIAGNOSIS — M109 Gout, unspecified: Secondary | ICD-10-CM | POA: Diagnosis not present

## 2018-08-15 DIAGNOSIS — I69398 Other sequelae of cerebral infarction: Secondary | ICD-10-CM | POA: Diagnosis not present

## 2018-08-15 DIAGNOSIS — M1611 Unilateral primary osteoarthritis, right hip: Secondary | ICD-10-CM | POA: Diagnosis not present

## 2018-08-15 DIAGNOSIS — I447 Left bundle-branch block, unspecified: Secondary | ICD-10-CM | POA: Diagnosis not present

## 2018-08-15 DIAGNOSIS — Z7901 Long term (current) use of anticoagulants: Secondary | ICD-10-CM | POA: Diagnosis not present

## 2018-08-15 DIAGNOSIS — H547 Unspecified visual loss: Secondary | ICD-10-CM | POA: Diagnosis not present

## 2018-08-15 DIAGNOSIS — N184 Chronic kidney disease, stage 4 (severe): Secondary | ICD-10-CM | POA: Diagnosis not present

## 2018-08-15 DIAGNOSIS — D631 Anemia in chronic kidney disease: Secondary | ICD-10-CM | POA: Diagnosis not present

## 2018-08-15 DIAGNOSIS — I129 Hypertensive chronic kidney disease with stage 1 through stage 4 chronic kidney disease, or unspecified chronic kidney disease: Secondary | ICD-10-CM | POA: Diagnosis not present

## 2018-08-15 DIAGNOSIS — Z7984 Long term (current) use of oral hypoglycemic drugs: Secondary | ICD-10-CM | POA: Diagnosis not present

## 2018-08-15 DIAGNOSIS — Z9181 History of falling: Secondary | ICD-10-CM | POA: Diagnosis not present

## 2018-08-15 DIAGNOSIS — Z86718 Personal history of other venous thrombosis and embolism: Secondary | ICD-10-CM | POA: Diagnosis not present

## 2018-08-15 DIAGNOSIS — M6281 Muscle weakness (generalized): Secondary | ICD-10-CM | POA: Diagnosis not present

## 2018-08-15 NOTE — Telephone Encounter (Signed)
First PT visit was 1/17 with eval only PT now ask for VO: 1x week for 1 week 2x week for 4 weeks For safety, strengthening, balance, gait Do you agree?

## 2018-08-15 NOTE — Telephone Encounter (Signed)
Agree 

## 2018-08-15 NOTE — Telephone Encounter (Signed)
Sharyn Lull with [name] requesting VO for OT. Please call back.

## 2018-08-17 ENCOUNTER — Ambulatory Visit (INDEPENDENT_AMBULATORY_CARE_PROVIDER_SITE_OTHER): Payer: Medicare Other | Admitting: Internal Medicine

## 2018-08-17 ENCOUNTER — Telehealth: Payer: Self-pay

## 2018-08-17 ENCOUNTER — Ambulatory Visit: Payer: Self-pay

## 2018-08-17 VITALS — BP 202/67 | HR 69 | Temp 97.8°F | Wt 134.3 lb

## 2018-08-17 DIAGNOSIS — Z7901 Long term (current) use of anticoagulants: Secondary | ICD-10-CM

## 2018-08-17 DIAGNOSIS — I10 Essential (primary) hypertension: Principal | ICD-10-CM

## 2018-08-17 DIAGNOSIS — H5462 Unqualified visual loss, left eye, normal vision right eye: Secondary | ICD-10-CM

## 2018-08-17 DIAGNOSIS — Z8673 Personal history of transient ischemic attack (TIA), and cerebral infarction without residual deficits: Secondary | ICD-10-CM

## 2018-08-17 DIAGNOSIS — E1122 Type 2 diabetes mellitus with diabetic chronic kidney disease: Secondary | ICD-10-CM

## 2018-08-17 DIAGNOSIS — I129 Hypertensive chronic kidney disease with stage 1 through stage 4 chronic kidney disease, or unspecified chronic kidney disease: Secondary | ICD-10-CM | POA: Diagnosis not present

## 2018-08-17 DIAGNOSIS — I447 Left bundle-branch block, unspecified: Secondary | ICD-10-CM | POA: Diagnosis not present

## 2018-08-17 DIAGNOSIS — Z86718 Personal history of other venous thrombosis and embolism: Secondary | ICD-10-CM | POA: Diagnosis not present

## 2018-08-17 DIAGNOSIS — M109 Gout, unspecified: Secondary | ICD-10-CM | POA: Diagnosis not present

## 2018-08-17 DIAGNOSIS — F0150 Vascular dementia without behavioral disturbance: Secondary | ICD-10-CM

## 2018-08-17 DIAGNOSIS — I69398 Other sequelae of cerebral infarction: Secondary | ICD-10-CM | POA: Diagnosis not present

## 2018-08-17 DIAGNOSIS — Z79899 Other long term (current) drug therapy: Secondary | ICD-10-CM

## 2018-08-17 DIAGNOSIS — E1159 Type 2 diabetes mellitus with other circulatory complications: Secondary | ICD-10-CM

## 2018-08-17 DIAGNOSIS — M1611 Unilateral primary osteoarthritis, right hip: Secondary | ICD-10-CM | POA: Diagnosis not present

## 2018-08-17 DIAGNOSIS — H547 Unspecified visual loss: Secondary | ICD-10-CM | POA: Diagnosis not present

## 2018-08-17 DIAGNOSIS — D631 Anemia in chronic kidney disease: Secondary | ICD-10-CM | POA: Diagnosis not present

## 2018-08-17 DIAGNOSIS — Z7984 Long term (current) use of oral hypoglycemic drugs: Secondary | ICD-10-CM | POA: Diagnosis not present

## 2018-08-17 DIAGNOSIS — N184 Chronic kidney disease, stage 4 (severe): Secondary | ICD-10-CM

## 2018-08-17 DIAGNOSIS — F0390 Unspecified dementia without behavioral disturbance: Secondary | ICD-10-CM

## 2018-08-17 DIAGNOSIS — Z9181 History of falling: Secondary | ICD-10-CM | POA: Diagnosis not present

## 2018-08-17 DIAGNOSIS — M6281 Muscle weakness (generalized): Secondary | ICD-10-CM | POA: Diagnosis not present

## 2018-08-17 NOTE — Progress Notes (Signed)
CC: hypertension  HPI:  Chelsea Mcdaniel is a 85 y.o. with a PMH of HTN, T2DM, Dementia, CKD stage 4, h/o CVA, h/o unprovoked DVTs on chronic anticoagulation presenting to clinic for follow up on HTN.  Patient with recent hospitalization for fall due to orthostatic hypotension. Her regimen was decreased at discharge to amlodipine 5mg  daily, hydralazine 10mg  TID, lasix 80mg  daily, and clonidine 0.2mg  patch every week. At [facility] visit, she still had symptomatic relative orthostatic hypotension despite being hypertensive to SBP of 190s; no changes were made at the time.   Today she is accompanied by her daughter in law who helps take care of her. After hospitalization, patient was in acute rehab and though requiring assistance with multiple ADLs, it was too expensive for her family to keep her at a SNF. She has had help from [facility] as well as help from multiple family members so is able to get 24hr supervision. Family notes she needs bedside commode as well as lightweight wheelchair.  Patient states she is feeling well. She denies chest pain, shortness of breath, headaches, new vision changes, weakness, numbness/tingling, hearing changes, abd pain, nausea, vomiting. She denies dizziness with standing. She denies falls.  Her daughter in law brought her medications which reveal she has been taking hydralazine 25mg  TID instead of 10mg  TID. Today is the day she changes her clonidine patch; on performing this, her daughter in law noticed the old patch had fallen off and it is unknown how long it was off.   Please see problem based Assessment and Plan for status of patients chronic conditions.  Past Medical History:  Diagnosis Date  . Anemia     normocytic anemia with baseline hemoglobin 10-11  . Blindness of left eye     likely related to stroke, left cataract removed from that eye with complications  . Calculus gallbladder and bile duct with cholecystitis with obstruction 01/2015  . Chronic kidney  disease 2006    left renal artery stenosis with probable hemodynamic significance, kidneys are normal in morphology without focal lesions or hydronephrosis this is based but cannot A. of the abdomen with and without contrast done on the 31st 2006  . CKD (chronic kidney disease), stage III ([place])   . CVA (cerebral vascular accident) ([place]) 1990's  . CVA (cerebrovascular accident) [facility])  October 2007    CT of the head atrophy with multiple remote insults noted but no definite acute findings  . Glaucoma   . Gout   . Hyperlipidemia   . Hypertension   . Personal history of DVT (deep vein thrombosis) 08/11/2010   BLE  . Type II diabetes mellitus ([place])     Review of Systems:   Per HPI  Physical Exam:  Vitals:   08/17/18 1348 08/17/18 1401  BP: (!) 200/66 (!) 202/67  Pulse: 68 69  Temp: 97.8 F (36.6 C)   TempSrc: Oral   SpO2: 99%   Weight: 134 lb 4.8 oz (60.9 kg)    GENERAL- alert, co-operative, appears as stated age, not in any distress. HEENT- oral mucosa appears moist. CARDIAC- RRR, no murmurs, rubs or gallops. RESP- Moving equal volumes of air, and clear to auscultation bilaterally, no wheezes or crackles. ABDOMEN- Soft, nontender, bowel sounds present. NEURO- CN 2-12 intact other than chronic decreased global vision in left eye. Finger to nose wnl. Strength and sensation while sitting intact. EXTREMITIES- pulse 2+ PT/radial, symmetric. 1+ pitting edema R>L (chronic per family) SKIN- Warm, dry, no rash or lesion. PSYCH-  Normal mood and affect, appropriate thought content and speech.  Assessment & Plan:   See Encounters Tab for problem based charting.   Patient discussed with Dr. Abelardo Diesel, MD Internal Medicine PGY-3

## 2018-08-17 NOTE — Telephone Encounter (Signed)
Agree, thank you!  Chelsea Mcdaniel

## 2018-08-17 NOTE — Telephone Encounter (Signed)
Received TC from [place], [place] from [facility].  She states she is at pt's home and her b/p is 204/87, states pt is asymptomatic and denies CNS disturbances.  Pt was seen by Dr. Jari Favre in [facility] this afternoon and b/p was elevated 202/67.  This RN informed Dr. Jari Favre of current b/p and that pt was asymptomatic per PT.  PT also states skilled nurse is to visit pt this evening.  Dr. Jari Favre states new clonidine patch will take 24hr to reach peak and if pt's systolic b/p reaches 885 or if pt is having any CNS disturbances to have pt present to ER. This RN informed pt's daughter of above instructions and she verbalized understanding. SChaplin, RN,BSN

## 2018-08-18 ENCOUNTER — Encounter: Payer: Self-pay | Admitting: Internal Medicine

## 2018-08-18 NOTE — Assessment & Plan Note (Signed)
Patient hypertensive to SBP of 200 without symptoms and no new, focal neuro deficits on exam.   She has been taking hydralazine 25mg  TID, instead of 10mg  TID, without apparent symptoms.   Plan: --continue amlodipine 5mg  daily, hydralazine 25mg  TID, clonidine 0.2mg  weekly patch, and lasix 80mg  daily --advised patient and family on return precautions if she were to develop CP, shortness of breath or new neurological symptoms. --f/u in 1 wk

## 2018-08-18 NOTE — Assessment & Plan Note (Addendum)
Patient with dementia requiring 24hr supervision and assistance with bathing, transferring. From conversation with family, she doesn't seem to be ambulating much at all. Staying at SNF was cost-prohibitive; family is able to help provide 24hr supervision with help from [facility], however this is difficult understandably. They have equipments needs which are appropriate. Family also ask to help coordinate with [facility] to see if she would be eligible for further aid hours.  Plan: --DME bedside commode and lightweight wheelchair --will have our staff coordinate with [facility] agency to assess eligibility for further assistance at home. --she would benefit from goals of care discussions and may be eligible for palliative care hh services - I discussed this with her PCP; patient will see PCP next month

## 2018-08-21 NOTE — Progress Notes (Signed)
Internal Medicine Clinic Attending  Case discussed with Dr. Svalina  at the time of the visit.  We reviewed the resident's history and exam and pertinent patient test results.  I agree with the assessment, diagnosis, and plan of care documented in the resident's note.  

## 2018-08-22 DIAGNOSIS — Z9181 History of falling: Secondary | ICD-10-CM | POA: Diagnosis not present

## 2018-08-22 DIAGNOSIS — D631 Anemia in chronic kidney disease: Secondary | ICD-10-CM | POA: Diagnosis not present

## 2018-08-22 DIAGNOSIS — M6281 Muscle weakness (generalized): Secondary | ICD-10-CM | POA: Diagnosis not present

## 2018-08-22 DIAGNOSIS — M109 Gout, unspecified: Secondary | ICD-10-CM | POA: Diagnosis not present

## 2018-08-22 DIAGNOSIS — I69398 Other sequelae of cerebral infarction: Secondary | ICD-10-CM | POA: Diagnosis not present

## 2018-08-22 DIAGNOSIS — N184 Chronic kidney disease, stage 4 (severe): Secondary | ICD-10-CM | POA: Diagnosis not present

## 2018-08-22 DIAGNOSIS — I447 Left bundle-branch block, unspecified: Secondary | ICD-10-CM | POA: Diagnosis not present

## 2018-08-22 DIAGNOSIS — Z7984 Long term (current) use of oral hypoglycemic drugs: Secondary | ICD-10-CM | POA: Diagnosis not present

## 2018-08-22 DIAGNOSIS — H547 Unspecified visual loss: Secondary | ICD-10-CM | POA: Diagnosis not present

## 2018-08-22 DIAGNOSIS — Z7901 Long term (current) use of anticoagulants: Secondary | ICD-10-CM | POA: Diagnosis not present

## 2018-08-22 DIAGNOSIS — I129 Hypertensive chronic kidney disease with stage 1 through stage 4 chronic kidney disease, or unspecified chronic kidney disease: Secondary | ICD-10-CM | POA: Diagnosis not present

## 2018-08-22 DIAGNOSIS — M1611 Unilateral primary osteoarthritis, right hip: Secondary | ICD-10-CM | POA: Diagnosis not present

## 2018-08-22 DIAGNOSIS — Z86718 Personal history of other venous thrombosis and embolism: Secondary | ICD-10-CM | POA: Diagnosis not present

## 2018-08-22 DIAGNOSIS — E1122 Type 2 diabetes mellitus with diabetic chronic kidney disease: Secondary | ICD-10-CM | POA: Diagnosis not present

## 2018-08-22 NOTE — Telephone Encounter (Signed)
Agree with verbal order for OT [facility] as stated.  Thank you.

## 2018-08-22 NOTE — Telephone Encounter (Signed)
Verbal Josem Kaufmann given to [place], [place] for [facility] OT 1 week 1, 2 week 4 and 1 week 1 to work on increasing self-care skills, balance, transfers and fall prevention. Will route to PCP for agreement/denial. Hubbard Hartshorn, RN, BSN

## 2018-08-24 DIAGNOSIS — D631 Anemia in chronic kidney disease: Secondary | ICD-10-CM | POA: Diagnosis not present

## 2018-08-24 DIAGNOSIS — Z7984 Long term (current) use of oral hypoglycemic drugs: Secondary | ICD-10-CM | POA: Diagnosis not present

## 2018-08-24 DIAGNOSIS — M1611 Unilateral primary osteoarthritis, right hip: Secondary | ICD-10-CM | POA: Diagnosis not present

## 2018-08-24 DIAGNOSIS — Z86718 Personal history of other venous thrombosis and embolism: Secondary | ICD-10-CM | POA: Diagnosis not present

## 2018-08-24 DIAGNOSIS — N184 Chronic kidney disease, stage 4 (severe): Secondary | ICD-10-CM | POA: Diagnosis not present

## 2018-08-24 DIAGNOSIS — Z7901 Long term (current) use of anticoagulants: Secondary | ICD-10-CM | POA: Diagnosis not present

## 2018-08-24 DIAGNOSIS — M109 Gout, unspecified: Secondary | ICD-10-CM | POA: Diagnosis not present

## 2018-08-24 DIAGNOSIS — I69398 Other sequelae of cerebral infarction: Secondary | ICD-10-CM | POA: Diagnosis not present

## 2018-08-24 DIAGNOSIS — Z9181 History of falling: Secondary | ICD-10-CM | POA: Diagnosis not present

## 2018-08-24 DIAGNOSIS — M6281 Muscle weakness (generalized): Secondary | ICD-10-CM | POA: Diagnosis not present

## 2018-08-24 DIAGNOSIS — E1122 Type 2 diabetes mellitus with diabetic chronic kidney disease: Secondary | ICD-10-CM | POA: Diagnosis not present

## 2018-08-24 DIAGNOSIS — I447 Left bundle-branch block, unspecified: Secondary | ICD-10-CM | POA: Diagnosis not present

## 2018-08-24 DIAGNOSIS — H547 Unspecified visual loss: Secondary | ICD-10-CM | POA: Diagnosis not present

## 2018-08-24 DIAGNOSIS — I129 Hypertensive chronic kidney disease with stage 1 through stage 4 chronic kidney disease, or unspecified chronic kidney disease: Secondary | ICD-10-CM | POA: Diagnosis not present

## 2018-08-25 DIAGNOSIS — M109 Gout, unspecified: Secondary | ICD-10-CM | POA: Diagnosis not present

## 2018-08-25 DIAGNOSIS — H547 Unspecified visual loss: Secondary | ICD-10-CM | POA: Diagnosis not present

## 2018-08-25 DIAGNOSIS — D631 Anemia in chronic kidney disease: Secondary | ICD-10-CM | POA: Diagnosis not present

## 2018-08-25 DIAGNOSIS — Z9181 History of falling: Secondary | ICD-10-CM | POA: Diagnosis not present

## 2018-08-25 DIAGNOSIS — Z7901 Long term (current) use of anticoagulants: Secondary | ICD-10-CM | POA: Diagnosis not present

## 2018-08-25 DIAGNOSIS — Z7984 Long term (current) use of oral hypoglycemic drugs: Secondary | ICD-10-CM | POA: Diagnosis not present

## 2018-08-25 DIAGNOSIS — Z86718 Personal history of other venous thrombosis and embolism: Secondary | ICD-10-CM | POA: Diagnosis not present

## 2018-08-25 DIAGNOSIS — M6281 Muscle weakness (generalized): Secondary | ICD-10-CM | POA: Diagnosis not present

## 2018-08-25 DIAGNOSIS — N184 Chronic kidney disease, stage 4 (severe): Secondary | ICD-10-CM | POA: Diagnosis not present

## 2018-08-25 DIAGNOSIS — I129 Hypertensive chronic kidney disease with stage 1 through stage 4 chronic kidney disease, or unspecified chronic kidney disease: Secondary | ICD-10-CM | POA: Diagnosis not present

## 2018-08-25 DIAGNOSIS — I69398 Other sequelae of cerebral infarction: Secondary | ICD-10-CM | POA: Diagnosis not present

## 2018-08-25 DIAGNOSIS — I447 Left bundle-branch block, unspecified: Secondary | ICD-10-CM | POA: Diagnosis not present

## 2018-08-25 DIAGNOSIS — E1122 Type 2 diabetes mellitus with diabetic chronic kidney disease: Secondary | ICD-10-CM | POA: Diagnosis not present

## 2018-08-25 DIAGNOSIS — M1611 Unilateral primary osteoarthritis, right hip: Secondary | ICD-10-CM | POA: Diagnosis not present

## 2018-08-26 ENCOUNTER — Emergency Department (HOSPITAL_COMMUNITY)
Admission: EM | Admit: 2018-08-26 | Discharge: 2018-08-26 | Disposition: A | Discharge status: Home / Self Care | Payer: Medicare Other | Attending: Emergency Medicine | Admitting: Emergency Medicine

## 2018-08-26 ENCOUNTER — Other Ambulatory Visit: Payer: Self-pay

## 2018-08-26 ENCOUNTER — Encounter (HOSPITAL_COMMUNITY): Payer: Self-pay | Admitting: Emergency Medicine

## 2018-08-26 ENCOUNTER — Emergency Department (HOSPITAL_BASED_OUTPATIENT_CLINIC_OR_DEPARTMENT_OTHER)
Admit: 2018-08-26 | Discharge: 2018-08-26 | Disposition: A | Discharge status: Home / Self Care | Payer: Medicare Other | Attending: Emergency Medicine | Admitting: Emergency Medicine

## 2018-08-26 ENCOUNTER — Emergency Department (HOSPITAL_COMMUNITY): Payer: Medicare Other

## 2018-08-26 DIAGNOSIS — I10 Essential (primary) hypertension: Secondary | ICD-10-CM | POA: Insufficient documentation

## 2018-08-26 DIAGNOSIS — E119 Type 2 diabetes mellitus without complications: Secondary | ICD-10-CM | POA: Diagnosis not present

## 2018-08-26 DIAGNOSIS — R52 Pain, unspecified: Secondary | ICD-10-CM | POA: Diagnosis not present

## 2018-08-26 DIAGNOSIS — M25551 Pain in right hip: Secondary | ICD-10-CM

## 2018-08-26 DIAGNOSIS — M545 Low back pain: Secondary | ICD-10-CM | POA: Diagnosis present

## 2018-08-26 DIAGNOSIS — F0390 Unspecified dementia without behavioral disturbance: Secondary | ICD-10-CM | POA: Insufficient documentation

## 2018-08-26 DIAGNOSIS — Z79899 Other long term (current) drug therapy: Secondary | ICD-10-CM | POA: Insufficient documentation

## 2018-08-26 LAB — BASIC METABOLIC PANEL
Anion gap: 9 (ref 5–15)
BUN: 29 mg/dL — ABNORMAL HIGH (ref 8–23)
CO2: 21 mmol/L — ABNORMAL LOW (ref 22–32)
Calcium: 8.7 mg/dL — ABNORMAL LOW (ref 8.9–10.3)
Chloride: 109 mmol/L (ref 98–111)
Creatinine, Ser: 1.66 mg/dL — ABNORMAL HIGH (ref 0.44–1.00)
GFR calc Af Amer: 32 mL/min — ABNORMAL LOW (ref >60)
GFR calc non Af Amer: 27 mL/min — ABNORMAL LOW (ref >60)
Glucose, Bld: 168 mg/dL — ABNORMAL HIGH (ref 70–99)
Potassium: 5.2 mmol/L — ABNORMAL HIGH (ref 3.5–5.1)
Sodium: 139 mmol/L (ref 135–145)

## 2018-08-26 LAB — CBC WITH DIFFERENTIAL/PLATELET
Abs Immature Granulocytes: 0.01 10*3/uL (ref 0.00–0.07)
Basophils Absolute: 0.1 10*3/uL (ref 0.0–0.1)
Basophils Relative: 1 %
Eosinophils Absolute: 0.1 10*3/uL (ref 0.0–0.5)
Eosinophils Relative: 3 %
HCT: 27.1 % — ABNORMAL LOW (ref 36.0–46.0)
Hemoglobin: 8.2 g/dL — ABNORMAL LOW (ref 12.0–15.0)
Immature Granulocytes: 0 %
Lymphocytes Relative: 20 %
Lymphs Abs: 0.9 10*3/uL (ref 0.7–4.0)
MCH: 29.7 pg (ref 26.0–34.0)
MCHC: 30.3 g/dL (ref 30.0–36.0)
MCV: 98.2 fL (ref 80.0–100.0)
Monocytes Absolute: 0.7 10*3/uL (ref 0.1–1.0)
Monocytes Relative: 15 %
Neutro Abs: 2.7 10*3/uL (ref 1.7–7.7)
Neutrophils Relative %: 61 %
Platelets: 261 10*3/uL (ref 150–400)
RBC: 2.76 MIL/uL — ABNORMAL LOW (ref 3.87–5.11)
RDW: 14.2 % (ref 11.5–15.5)
WBC: 4.5 10*3/uL (ref 4.0–10.5)
nRBC: 0 % (ref 0.0–0.2)

## 2018-08-26 LAB — PROTIME-INR
INR: 2.01
Prothrombin Time: 22.5 seconds — ABNORMAL HIGH (ref 11.4–15.2)

## 2018-08-26 MED ORDER — LIDOCAINE 5 % EX PTCH: 1.0000 | MEDICATED_PATCH | CUTANEOUS | 0 refills | Status: DC

## 2018-08-26 MED ORDER — LIDOCAINE 5 % EX PTCH
1.0000 | MEDICATED_PATCH | CUTANEOUS | Status: DC
  Administered 2018-08-26: 1 via TRANSDERMAL
  Filled 2018-08-26: qty 1

## 2018-08-26 MED ORDER — MORPHINE SULFATE (PF) 2 MG/ML IV SOLN
2.0000 mg | Freq: Once | INTRAVENOUS | Status: AC
  Administered 2018-08-26: 2 mg via INTRAVENOUS
  Filled 2018-08-26: qty 1

## 2018-08-26 NOTE — ED Notes (Signed)
EDP [place] at bedside

## 2018-08-26 NOTE — ED Notes (Signed)
Patient transported to X-ray 

## 2018-08-26 NOTE — ED Notes (Signed)
Went to vascular

## 2018-08-26 NOTE — ED Notes (Signed)
EDP [place] at bedside

## 2018-08-26 NOTE — ED Provider Notes (Signed)
Medical screening examination/treatment/procedure(s) were conducted as a shared visit with non-physician practitioner(s) and myself.  I personally evaluated the patient during the encounter. Briefly, the patient is a 85 y.o. female with history of high cholesterol, hypertension, DVT on anticoagulation who presents the ED with right hip pain.  Patient with normal vitals.  No fever.  Patient with increasing right hip pain and right lower leg pain since increasing physical therapy this past week.  Denies any new trauma.  Has lower leg swelling on the right greater than the left.  But patient states that his baseline.  Has good range of motion of the right lower leg.  No specific tenderness.  X-ray of the hip does not show any acute fractures.  Spine x-ray also unremarkable.  DVT study was ordered that was normal.  Patient had INR call.  Otherwise no significant anemia, electrolyte abnormality.  Creatinine at baseline.  Patient was able to ambulate without any issues.  She was discharged to home.  Recommend continued use of Tylenol for pain.  Given return precautions.  Likely musculoskeletal pain.  This chart was dictated using voice recognition software.  Despite best efforts to proofread,  errors can occur which can change the documentation meaning.     EKG Interpretation None           Lennice Sites, DO 08/26/18 1623

## 2018-08-26 NOTE — Discharge Instructions (Signed)
1. Medications: You can take [phone] mg of Tylenol every 6 hours as needed for pain. Do not exceed 4000 mg of Tylenol daily.  Apply lidocaine patches to the areas of pain as prescribed.  Continue to take your home medicines as prescribed including your blood pressure medications. 2. Treatment: rest, ice, elevate and continue to use your walker, drink plenty of fluids, gentle stretching 3. Follow Up: Please followup with your primary care provider within 1 week if no improvement for discussion of your diagnoses and further evaluation after today's visit; if you do not have a primary care doctor use the resource guide provided to find one; Please return to the ER for worsening symptoms or other concerns such as worsening swelling, redness of the skin, fevers, loss of pulses, or loss of feeling  If your blood pressure (BP) was elevated on multiple readings during this visit above 130 for the top number or above 80 for the bottom number, please have this repeated by your primary care provider within one month. You can also check your blood pressure when you are out at a pharmacy or grocery store. Many have machines that will check your blood pressure.  If your blood pressure remains elevated, please follow-up with your PCP.

## 2018-08-26 NOTE — Progress Notes (Signed)
Right lower extremity venous duplex has been completed. Negative for DVT. Results were given to [facility] PA.  08/26/18 2:32 PM Carlos Levering RVT

## 2018-08-26 NOTE — ED Provider Notes (Signed)
[place] EMERGENCY DEPARTMENT Provider Note   CSN: [id number] Arrival date & time: 08/26/18  1238     History   Chief Complaint Chief Complaint  Patient presents with  . Hip Pain    HPI Chelsea Mcdaniel is a 85 y.o. female with history of anemia, left eye blindness, CKD, CVA, HLD, HTN, DVT, type 2 diabetes mellitus presenting for evaluation of acute onset, progressively worsening right low back/hip pain for 2 days.  She reports that pain occurs only with any attempt to ambulate and radiates from the right low back down to the buttock.  She denies any pain laying flat.  Family notes that she has had physical therapy at home and thinks that she may have sustained an injury to the hip during her therapy.  The patient denies any recent trauma or falls.  She does have right lower extremity edema which family reports has been present for the last couple of weeks.  Patient denies bowel or bladder incontinence, saddle anesthesia, fevers, abdominal pain, nausea, vomiting, urinary symptoms, chest pain, or shortness of breath.  She is currently anticoagulated on warfarin.  She ambulates with the aid of a walker at all times.    The history is provided by the patient.    Past Medical History:  Diagnosis Date  . Anemia     normocytic anemia with baseline hemoglobin 10-11  . Blindness of left eye     likely related to stroke, left cataract removed from that eye with complications  . Calculus gallbladder and bile duct with cholecystitis with obstruction 01/2015  . Chronic kidney disease 2006    left renal artery stenosis with probable hemodynamic significance, kidneys are normal in morphology without focal lesions or hydronephrosis this is based but cannot A. of the abdomen with and without contrast done on the 31st 2006  . CKD (chronic kidney disease), stage III ([place])   . CVA (cerebral vascular accident) ([place] [facility]) 1990's  . CVA (cerebrovascular accident) [facility])  October 2007    CT  of the head atrophy with multiple remote insults noted but no definite acute findings  . Glaucoma   . Gout   . Hyperlipidemia   . Hypertension   . Personal history of DVT (deep vein thrombosis) 08/11/2010   BLE  . Type II diabetes mellitus [facility])     Patient Active Problem List   Diagnosis Date Noted  . Protein-calorie malnutrition, severe 07/14/2018  . Dementia ([place]) 07/12/2018  . Fall at home 07/11/2018  . Malaise and fatigue 05/02/2018  . Weight loss 02/28/2018  . Ankle edema, bilateral 04/18/2017  . Senile purpura ([place]) 12/18/2016  . Immunization not carried out because of parent refusal 12/18/2016  . Advance care planning 02/18/2016  . Fatigue 10/20/2015  . Unequal blood pressure in upper extremities 10/20/2015  . Choledocholithiasis 02/11/2015  . Constipation 01/01/2015  . Cervical radiculopathy 12/10/2014  . Left knee pain 08/24/2014  . Vitamin D insufficiency 08/24/2014  . Carpal tunnel syndrome 01/07/2014  . 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 Healthcare maintenance 08/15/2013  . CKD (chronic kidney disease) stage 4, GFR 15-29 ml/min (HCC) 02/16/2012  . Osteoarthritis of hand 02/16/2012  . Long-term (current) use of anticoagulants 08/11/2010  . Type 2 diabetes mellitus ([place]) 12/12/2006  . Hyperlipidemia 12/12/2006  . Anemia of chronic disease 12/12/2006  . Hypertension associated with diabetes ([place]) 12/12/2006  . Late effect of cerebrovascular accident (CVA) 09/19/2006    Past Surgical History:  Procedure Laterality Date  . ABDOMINAL HYSTERECTOMY    . CATARACT EXTRACTION Left   . TRANSTHORACIC ECHOCARDIOGRAM   May 2008    left ventricular systolic function normal EF estimated range of 55-60%, no definite diagnostic evidence of left ventricular regional wall motion abnormalities, Doppler parameters consistent with abnormal left ventricular relaxation, findings suggestive of possible bicuspid aortic valve and aortic valve thickness  mildly increase     OB History   No obstetric history on file.      Home Medications    Prior to Admission medications   Medication Sig Start Date End Date Taking? Authorizing Provider  ACCU-CHEK SOFTCLIX LANCETS lancets Used to check blood sugar 3 times daily. Dx code E11.8 06/08/17   [name], MD  allopurinol (ZYLOPRIM) 100 MG tablet Take 1 tablet (100 mg total) by mouth daily. 06/12/18 09/10/18  Chundi, Verne Spurr, MD  amLODipine (NORVASC) 5 MG tablet Take 5 mg by mouth daily. 06/20/18   [provider]  Blood Glucose Monitoring Suppl (ACCU-CHEK AVIVA PLUS) w/Device KIT Check blood sugar 1 time a day 03/08/18   Chundi, Verne Spurr, MD  cloNIDine (CATAPRES - DOSED IN MG/24 HR) 0.2 mg/24hr patch Place 1 patch (0.2 mg total) onto the skin once a week. 02/28/18   Chundi, Verne Spurr, MD  dorzolamide-timolol (COSOPT) 22.3-6.8 MG/ML ophthalmic solution Place 1 drop into both eyes 2 (two) times daily. 05/16/18   [provider]  furosemide (LASIX) 10 MG/ML solution Take 8 mLs (80 mg total) by mouth daily as needed for edema. 07/14/18   Agyei, Caprice Kluver, MD  glucose blood (ACCU-CHEK AVIVA PLUS) test strip USE 1 TIME DAILY TO CHECK BLOOD SUGAR 09/03/17   Chundi, [name], MD  glucose blood (ACCU-CHEK AVIVA) test strip Use as instructed 05/03/11 09/03/18  Othella Boyer, MD  hydrALAZINE (APRESOLINE) 25 MG tablet Take 25 mg by mouth 3 (three) times daily.    [provider]  JANUVIA 25 MG tablet TAKE 1 TABLET BY MOUTH EVERY DAY Patient taking differently: Take 25 mg by mouth daily.  06/12/18   [name], MD  Lancets Misc. (ACCU-CHEK SOFTCLIX LANCET DEV) KIT Use to check blood sugar one time a day 09/03/17   Chundi, [name], MD  lidocaine (LIDODERM) 5 % Place 1 patch onto the skin daily. Remove & Discard patch within 12 hours or as directed by MD 08/26/18   Rodell Perna A, PA-C  LUMIGAN 0.01 % SOLN Place 1 drop into both eyes at bedtime.  12/05/13   [provider]  polyethylene  glycol (MIRALAX) packet Take 17 g by mouth 2 (two) times daily. 03/25/17   [name], MD  sertraline (ZOLOFT) 25 MG tablet Take 1 tablet (25 mg total) by mouth daily. 07/21/18   Ledell Noss, MD  warfarin (COUMADIN) 5 MG tablet TAKE 1 TABLET BY MOUTH EVERY DAY AT 6 PM EXCEPT WEDNESDAY AND FRIDAY TAKE 1 & 1/2 TABLETS ON THOSE DAYS Patient taking differently: Take 5-7.5 mg by mouth See admin instructions. TAKE 1 TABLET BY MOUTH EVERY DAY AT 6 PM EXCEPT [facility] AND FRIDAY TAKE 1 & 1/2 TABLETS ON THOSE DAYS 07/10/18   [name], MD    Family History Family History  Problem Relation Age of Onset  . Heart disease Mother   . Diabetes Mother   . Hyperlipidemia Mother   . Hypertension Mother   . Heart disease Father   . Diabetes Father   . Hyperlipidemia Father   . Hypertension Father     Social  History Social History   Tobacco Use  . Smoking status: Never Smoker  . Smokeless tobacco: Never Used  Substance Use Topics  . Alcohol use: No    Alcohol/week: 0.0 standard drinks  . Drug use: No     Allergies   Diltiazem and Metoprolol   Review of Systems Review of Systems  Constitutional: Negative for chills and fever.  Respiratory: Negative for shortness of breath.   Cardiovascular: Positive for leg swelling. Negative for chest pain.  Gastrointestinal: Negative for abdominal pain, nausea and vomiting.  Musculoskeletal: Positive for arthralgias and back pain.  Neurological: Negative for weakness and numbness.  All other systems reviewed and are negative.    Physical Exam Updated Vital Signs BP (!) 148/94 (BP Location: Right Arm)   Pulse 69   Temp 97.6 F (36.4 C) (Oral)   Resp 19   Wt 60.9 kg   SpO2 96%   BMI 23.79 kg/m   Physical Exam Vitals signs and nursing note reviewed.  Constitutional:      General: She is not in acute distress.    Appearance: She is well-developed.  HENT:     Head: Normocephalic and atraumatic.  Eyes:     General:         Right eye: No discharge.        Left eye: No discharge.     Conjunctiva/sclera: Conjunctivae normal.  Neck:     Vascular: No JVD.     Trachea: No tracheal deviation.  Cardiovascular:     Rate and Rhythm: Normal rate and regular rhythm.     Comments: 1+ DP/PT pulses bilaterally, 2+ pitting edema of the right lower extremity, Homans sign present on the right.  No palpable cords, compartments are soft Pulmonary:     Effort: Pulmonary effort is normal.     Breath sounds: Normal breath sounds.  Abdominal:     General: Abdomen is flat. There is no distension.     Tenderness: There is no abdominal tenderness. There is no guarding or rebound.  Musculoskeletal: Normal range of motion.     Comments: No midline spine TTP, no paraspinal muscle tenderness, no deformity, crepitus, or step-off noted.  4+/5 strength with right hip flexors, otherwise 5/5 strength of BLE major muscle groups.  Pain elicited with internal rotation of the right hip.  Skin:    General: Skin is warm and dry.     Findings: No erythema.  Neurological:     Mental Status: She is alert.     Comments: Fluent speech, no facial droop, sensation intact to soft touch of upper and lower extremities bilaterally.  Cranial nerves grossly intact.  Psychiatric:        Behavior: Behavior normal.      ED Treatments / Results  Labs (all labs ordered are listed, but only abnormal results are displayed) Labs Reviewed  CBC WITH DIFFERENTIAL/PLATELET - Abnormal; Notable for the following components:      Result Value   RBC 2.76 (*)    Hemoglobin 8.2 (*)    HCT 27.1 (*)    All other components within normal limits  BASIC METABOLIC PANEL - Abnormal; Notable for the following components:   Potassium 5.2 (*)    CO2 21 (*)    Glucose, Bld 168 (*)    BUN 29 (*)    Creatinine, Ser 1.66 (*)    Calcium 8.7 (*)    GFR calc non Af Amer 27 (*)    GFR calc Af Amer 32 (*)  All other components within normal limits  PROTIME-INR - Abnormal;  Notable for the following components:   Prothrombin Time 22.5 (*)    All other components within normal limits    EKG None  Radiology Dg Lumbar Spine Complete  Result Date: 08/26/2018 CLINICAL DATA:  Chronic right hip pain.  No known injury. EXAM: LUMBAR SPINE - COMPLETE 4+ VIEW COMPARISON:  CT abdomen and pelvis 10/24/2014. FINDINGS: There is no fracture or malalignment. Intervertebral disc space height is maintained. Lower lumbar facet arthropathy is seen. Aortic atherosclerosis noted. IMPRESSION: No acute finding. Lower lumbar facet degenerative disease. Atherosclerosis. Electronically Signed   By: Inge Rise M.D.   On: 08/26/2018 14:07   Dg Hip Unilat With Pelvis 2-3 Views Right  Result Date: 08/26/2018 CLINICAL DATA:  Right hip pain EXAM: DG HIP (WITH OR WITHOUT PELVIS) 2-3V RIGHT COMPARISON:  07/11/2018 FINDINGS: Osteopenia. No acute fracture. No dislocation. Moderate degenerative change of the hip joints bilaterally. Atherosclerotic vascular calcifications. IMPRESSION: No acute bony pathology.  Chronic changes are noted. Electronically Signed   By: Marybelle Killings M.D.   On: 08/26/2018 14:07   Vas [place] Lower Extremity Venous (dvt) (only Mc & Wl 7a-7p)  Result Date: 08/26/2018  Lower Venous Study Indications: Pain.  Performing Technologist: Oliver Hum RVT  Examination Guidelines: A complete evaluation includes B-mode imaging, spectral Doppler, color Doppler, and power Doppler as needed of all accessible portions of each vessel. Bilateral testing is considered an integral part of a complete examination. Limited examinations for reoccurring indications may be performed as noted.  Right Venous Findings: +---------+---------------+---------+-----------+----------+-------+          CompressibilityPhasicitySpontaneityPropertiesSummary +---------+---------------+---------+-----------+----------+-------+ CFV      Full           Yes      Yes                           +---------+---------------+---------+-----------+----------+-------+ SFJ      Full                                                 +---------+---------------+---------+-----------+----------+-------+ FV Prox  Full                                                 +---------+---------------+---------+-----------+----------+-------+ FV Mid   Full                                                 +---------+---------------+---------+-----------+----------+-------+ FV DistalFull                                                 +---------+---------------+---------+-----------+----------+-------+ PFV      Full                                                 +---------+---------------+---------+-----------+----------+-------+  POP      Full           Yes      Yes                          +---------+---------------+---------+-----------+----------+-------+ PTV      Full                                                 +---------+---------------+---------+-----------+----------+-------+ PERO     Full                                                 +---------+---------------+---------+-----------+----------+-------+  Left Venous Findings: +---+---------------+---------+-----------+----------+-------+    CompressibilityPhasicitySpontaneityPropertiesSummary +---+---------------+---------+-----------+----------+-------+ CFVFull           Yes      Yes                          +---+---------------+---------+-----------+----------+-------+    Summary: Right: There is no evidence of deep vein thrombosis in the lower extremity. No cystic structure found in the popliteal fossa. Left: No evidence of common femoral vein obstruction.  *See table(s) above for measurements and observations. Electronically signed by Curt Jews MD on 08/26/2018 at 3:30:41 PM.    Final     Procedures Procedures (including critical care time)  Medications Ordered in ED Medications  morphine 2  MG/ML injection 2 mg (2 mg Intravenous Given 08/26/18 1309)     Initial Impression / Assessment and Plan / ED Course  I have reviewed the triage vital signs and the nursing notes.  Pertinent labs & imaging results that were available during my care of the patient were reviewed by me and considered in my medical decision making (see chart for details).     Patient presenting for evaluation of right-sided low back and hip pain.  She is afebrile, persistently hypertensive in the ED with improvement on reevaluation.  Family at the bedside report that this has been her baseline blood pressure for some time and she has had some medication adjustments, though her hypertension could also be secondary to pain as it improved after treating the patient's pain.  She has some right lower extremity edema on examination though family reports that this is not unusual for her and appears to to be intermittent but may be worsening over the last few weeks.  She is neurovascularly intact, no red flag signs concerning for cauda equina or spinal abscess.  DVT study is negative.  She denies any chest pain or shortness of breath to suggest PE and she is not hypoxic, tachycardic, and exhibits no increased work of breathing.  Lungs are clear bilaterally.  Doubt cardiac etiology of her symptoms including ACS/MI or dissection.  Radiographs of the lumbar spine and hip show no acute osseous abnormality.  There is evidence of chronic arthritis and physical examination suggest possible right hip ligamentous inflammation.  On reevaluation patient is resting comfortably in no apparent distress.  She reports that her pain has improved.  She is able to ambulate with some assistance.  She ambulates with the aid of a walker at all times at home and currently has family at home  24/7.  Suspect that she may have sustained a musculoskeletal injury with increased activity during physical therapy at home.  Recommend conservative therapy with Tylenol,  lidocaine patches, gentle stretching, and follow-up with PCP or orthopedics.  Discussed strict ED return precautions.  Patient seen and evaluated by Dr. Ronnald Nian who agrees with assessment and plan at this time.   Final Clinical Impressions(s) / ED Diagnoses   Final diagnoses:  Acute pain of right hip  Hypertension, unspecified type    ED Discharge Orders         Ordered    lidocaine (LIDODERM) 5 %  Every 24 hours     08/26/18 1454           Renita Papa, PA-C 08/27/18 0814    Lennice Sites, DO 08/27/18 1108

## 2018-08-26 NOTE — ED Notes (Signed)
No Signature pad avail

## 2018-08-26 NOTE — ED Triage Notes (Signed)
To ED via private vehicle with c/o right hip pain with walking, normally walks with a walker-  Denies any fever, no diarrhea, no incontinence of urine. Right ankle is more swollen than left- positive pedal pulses on both feet.

## 2018-08-29 ENCOUNTER — Other Ambulatory Visit: Payer: Self-pay | Admitting: Internal Medicine

## 2018-08-29 DIAGNOSIS — N184 Chronic kidney disease, stage 4 (severe): Secondary | ICD-10-CM | POA: Diagnosis not present

## 2018-08-29 DIAGNOSIS — Z7901 Long term (current) use of anticoagulants: Secondary | ICD-10-CM | POA: Diagnosis not present

## 2018-08-29 DIAGNOSIS — D631 Anemia in chronic kidney disease: Secondary | ICD-10-CM | POA: Diagnosis not present

## 2018-08-29 DIAGNOSIS — I69398 Other sequelae of cerebral infarction: Secondary | ICD-10-CM | POA: Diagnosis not present

## 2018-08-29 DIAGNOSIS — M1611 Unilateral primary osteoarthritis, right hip: Secondary | ICD-10-CM | POA: Diagnosis not present

## 2018-08-29 DIAGNOSIS — M6281 Muscle weakness (generalized): Secondary | ICD-10-CM | POA: Diagnosis not present

## 2018-08-29 DIAGNOSIS — I447 Left bundle-branch block, unspecified: Secondary | ICD-10-CM | POA: Diagnosis not present

## 2018-08-29 DIAGNOSIS — Z86718 Personal history of other venous thrombosis and embolism: Secondary | ICD-10-CM | POA: Diagnosis not present

## 2018-08-29 DIAGNOSIS — M109 Gout, unspecified: Secondary | ICD-10-CM | POA: Diagnosis not present

## 2018-08-29 DIAGNOSIS — H547 Unspecified visual loss: Secondary | ICD-10-CM | POA: Diagnosis not present

## 2018-08-29 DIAGNOSIS — Z9181 History of falling: Secondary | ICD-10-CM | POA: Diagnosis not present

## 2018-08-29 DIAGNOSIS — E118 Type 2 diabetes mellitus with unspecified complications: Secondary | ICD-10-CM

## 2018-08-29 DIAGNOSIS — E1122 Type 2 diabetes mellitus with diabetic chronic kidney disease: Secondary | ICD-10-CM | POA: Diagnosis not present

## 2018-08-29 DIAGNOSIS — I129 Hypertensive chronic kidney disease with stage 1 through stage 4 chronic kidney disease, or unspecified chronic kidney disease: Secondary | ICD-10-CM | POA: Diagnosis not present

## 2018-08-29 DIAGNOSIS — Z7984 Long term (current) use of oral hypoglycemic drugs: Secondary | ICD-10-CM | POA: Diagnosis not present

## 2018-08-29 NOTE — Telephone Encounter (Signed)
Next appt scheduled 2/14 with PCP.

## 2018-08-31 ENCOUNTER — Telehealth: Payer: Self-pay

## 2018-08-31 DIAGNOSIS — H547 Unspecified visual loss: Secondary | ICD-10-CM | POA: Diagnosis not present

## 2018-08-31 DIAGNOSIS — M109 Gout, unspecified: Secondary | ICD-10-CM | POA: Diagnosis not present

## 2018-08-31 DIAGNOSIS — E1122 Type 2 diabetes mellitus with diabetic chronic kidney disease: Secondary | ICD-10-CM | POA: Diagnosis not present

## 2018-08-31 DIAGNOSIS — Z86718 Personal history of other venous thrombosis and embolism: Secondary | ICD-10-CM | POA: Diagnosis not present

## 2018-08-31 DIAGNOSIS — I69398 Other sequelae of cerebral infarction: Secondary | ICD-10-CM | POA: Diagnosis not present

## 2018-08-31 DIAGNOSIS — N184 Chronic kidney disease, stage 4 (severe): Secondary | ICD-10-CM | POA: Diagnosis not present

## 2018-08-31 DIAGNOSIS — Z7901 Long term (current) use of anticoagulants: Secondary | ICD-10-CM | POA: Diagnosis not present

## 2018-08-31 DIAGNOSIS — D631 Anemia in chronic kidney disease: Secondary | ICD-10-CM | POA: Diagnosis not present

## 2018-08-31 DIAGNOSIS — Z9181 History of falling: Secondary | ICD-10-CM | POA: Diagnosis not present

## 2018-08-31 DIAGNOSIS — I447 Left bundle-branch block, unspecified: Secondary | ICD-10-CM | POA: Diagnosis not present

## 2018-08-31 DIAGNOSIS — M6281 Muscle weakness (generalized): Secondary | ICD-10-CM | POA: Diagnosis not present

## 2018-08-31 DIAGNOSIS — M1611 Unilateral primary osteoarthritis, right hip: Secondary | ICD-10-CM | POA: Diagnosis not present

## 2018-08-31 DIAGNOSIS — I129 Hypertensive chronic kidney disease with stage 1 through stage 4 chronic kidney disease, or unspecified chronic kidney disease: Secondary | ICD-10-CM | POA: Diagnosis not present

## 2018-08-31 DIAGNOSIS — Z7984 Long term (current) use of oral hypoglycemic drugs: Secondary | ICD-10-CM | POA: Diagnosis not present

## 2018-08-31 NOTE — Telephone Encounter (Signed)
Monique with [facility] requesting wheelchair for pt. Please fax the order to [facility].

## 2018-08-31 NOTE — Telephone Encounter (Signed)
Community message sent to Andria Rhein at [facility] that order for manual w/c has been placed. Community message sent to Jolee Ewing at [facility] that order for [facility] has been placed.

## 2018-09-01 DIAGNOSIS — I129 Hypertensive chronic kidney disease with stage 1 through stage 4 chronic kidney disease, or unspecified chronic kidney disease: Secondary | ICD-10-CM | POA: Diagnosis not present

## 2018-09-01 DIAGNOSIS — M1611 Unilateral primary osteoarthritis, right hip: Secondary | ICD-10-CM | POA: Diagnosis not present

## 2018-09-01 DIAGNOSIS — Z7984 Long term (current) use of oral hypoglycemic drugs: Secondary | ICD-10-CM | POA: Diagnosis not present

## 2018-09-01 DIAGNOSIS — I69398 Other sequelae of cerebral infarction: Secondary | ICD-10-CM | POA: Diagnosis not present

## 2018-09-01 DIAGNOSIS — I447 Left bundle-branch block, unspecified: Secondary | ICD-10-CM | POA: Diagnosis not present

## 2018-09-01 DIAGNOSIS — N183 Chronic kidney disease, stage 3 (moderate): Secondary | ICD-10-CM | POA: Diagnosis not present

## 2018-09-01 DIAGNOSIS — M6281 Muscle weakness (generalized): Secondary | ICD-10-CM | POA: Diagnosis not present

## 2018-09-01 DIAGNOSIS — Z9181 History of falling: Secondary | ICD-10-CM | POA: Diagnosis not present

## 2018-09-01 DIAGNOSIS — Z86718 Personal history of other venous thrombosis and embolism: Secondary | ICD-10-CM | POA: Diagnosis not present

## 2018-09-01 DIAGNOSIS — N184 Chronic kidney disease, stage 4 (severe): Secondary | ICD-10-CM | POA: Diagnosis not present

## 2018-09-01 DIAGNOSIS — R296 Repeated falls: Secondary | ICD-10-CM | POA: Diagnosis not present

## 2018-09-01 DIAGNOSIS — E0800 Diabetes mellitus due to underlying condition with hyperosmolarity without nonketotic hyperglycemic-hyperosmolar coma (NKHHC): Secondary | ICD-10-CM | POA: Diagnosis not present

## 2018-09-01 DIAGNOSIS — H547 Unspecified visual loss: Secondary | ICD-10-CM | POA: Diagnosis not present

## 2018-09-01 DIAGNOSIS — Z7901 Long term (current) use of anticoagulants: Secondary | ICD-10-CM | POA: Diagnosis not present

## 2018-09-01 DIAGNOSIS — E1122 Type 2 diabetes mellitus with diabetic chronic kidney disease: Secondary | ICD-10-CM | POA: Diagnosis not present

## 2018-09-01 DIAGNOSIS — D631 Anemia in chronic kidney disease: Secondary | ICD-10-CM | POA: Diagnosis not present

## 2018-09-01 DIAGNOSIS — M109 Gout, unspecified: Secondary | ICD-10-CM | POA: Diagnosis not present

## 2018-09-01 NOTE — Telephone Encounter (Signed)
Thank you! I thought I had forwarded it. Sorry!  Chelsea Mcdaniel

## 2018-09-04 ENCOUNTER — Encounter: Payer: Self-pay | Admitting: Pharmacist

## 2018-09-04 ENCOUNTER — Ambulatory Visit (INDEPENDENT_AMBULATORY_CARE_PROVIDER_SITE_OTHER): Payer: Medicare Other | Admitting: Pharmacist

## 2018-09-04 DIAGNOSIS — M1611 Unilateral primary osteoarthritis, right hip: Secondary | ICD-10-CM | POA: Diagnosis not present

## 2018-09-04 DIAGNOSIS — D631 Anemia in chronic kidney disease: Secondary | ICD-10-CM | POA: Diagnosis not present

## 2018-09-04 DIAGNOSIS — E1122 Type 2 diabetes mellitus with diabetic chronic kidney disease: Secondary | ICD-10-CM | POA: Diagnosis not present

## 2018-09-04 DIAGNOSIS — I69398 Other sequelae of cerebral infarction: Secondary | ICD-10-CM | POA: Diagnosis not present

## 2018-09-04 DIAGNOSIS — I447 Left bundle-branch block, unspecified: Secondary | ICD-10-CM | POA: Diagnosis not present

## 2018-09-04 DIAGNOSIS — Z7901 Long term (current) use of anticoagulants: Secondary | ICD-10-CM

## 2018-09-04 DIAGNOSIS — Z5181 Encounter for therapeutic drug level monitoring: Secondary | ICD-10-CM | POA: Diagnosis not present

## 2018-09-04 DIAGNOSIS — H547 Unspecified visual loss: Secondary | ICD-10-CM | POA: Diagnosis not present

## 2018-09-04 DIAGNOSIS — Z9181 History of falling: Secondary | ICD-10-CM | POA: Diagnosis not present

## 2018-09-04 DIAGNOSIS — M6281 Muscle weakness (generalized): Secondary | ICD-10-CM | POA: Diagnosis not present

## 2018-09-04 DIAGNOSIS — N184 Chronic kidney disease, stage 4 (severe): Secondary | ICD-10-CM | POA: Diagnosis not present

## 2018-09-04 DIAGNOSIS — M109 Gout, unspecified: Secondary | ICD-10-CM | POA: Diagnosis not present

## 2018-09-04 DIAGNOSIS — Z86718 Personal history of other venous thrombosis and embolism: Secondary | ICD-10-CM

## 2018-09-04 DIAGNOSIS — Z7984 Long term (current) use of oral hypoglycemic drugs: Secondary | ICD-10-CM | POA: Diagnosis not present

## 2018-09-04 DIAGNOSIS — I129 Hypertensive chronic kidney disease with stage 1 through stage 4 chronic kidney disease, or unspecified chronic kidney disease: Secondary | ICD-10-CM | POA: Diagnosis not present

## 2018-09-04 LAB — POCT INR: INR: 2.1 (ref 2.0–3.0)

## 2018-09-04 NOTE — Progress Notes (Signed)
Anticoagulation Management Chelsea Mcdaniel is a 85 y.o. female who reports to the clinic for monitoring of warfarin treatment.    Indication: DVT, History of; Long term current use of anticoagulant.   Duration: indefinite Supervising physician: Joni Reining  Anticoagulation Clinic Visit History: Patient does not report signs/symptoms of bleeding or thromboembolism  Other recent changes: No diet, medications, lifestyle changes.  Anticoagulation Episode Summary    Current INR goal:   2.0-3.0  TTR:   74.2 % (4.6 y)  Next INR check:   10/02/2018  INR from last check:   2.1 (09/04/2018)  Weekly max warfarin dose:     Target end date:     INR check location:     Preferred lab:     Send INR reminders to:      Indications   EMBOLISM/THROMBOSIS DEEP VSL LWR EXTRM NOS (Resolved) [I82.409] Long-term (current) use of anticoagulants [Z79.01]       Comments:           Allergies  Allergen Reactions  . Diltiazem Other (See Comments)    Symptomatic bradaycardia  . Metoprolol Other (See Comments)    Symptomatic bradycardia   Prior to Admission medications   Medication Sig Start Date End Date Taking? Authorizing Provider  ACCU-CHEK SOFTCLIX LANCETS lancets Used to check blood sugar 3 times daily. 08/30/18  Yes Chundi, [name], MD  allopurinol (ZYLOPRIM) 100 MG tablet Take 1 tablet (100 mg total) by mouth daily. 06/12/18 09/10/18 Yes Chundi, [name], MD  amLODipine (NORVASC) 5 MG tablet Take 5 mg by mouth daily. 06/20/18  Yes [provider]  Blood Glucose Monitoring Suppl (ACCU-CHEK AVIVA PLUS) w/Device KIT Check blood sugar 1 time a day 03/08/18  Yes Chundi, [name], MD  cloNIDine (CATAPRES - DOSED IN MG/24 HR) 0.2 mg/24hr patch Place 1 patch (0.2 mg total) onto the skin once a week. 02/28/18  Yes Chundi, [name], MD  dorzolamide-timolol (COSOPT) 22.3-6.8 MG/ML ophthalmic solution Place 1 drop into both eyes 2 (two) times daily. 05/16/18  Yes [provider]  furosemide (LASIX) 10  MG/ML solution Take 8 mLs (80 mg total) by mouth daily as needed for edema. 07/14/18  Yes Agyei, Caprice Kluver, MD  glucose blood (ACCU-CHEK AVIVA PLUS) test strip USE 1 TIME DAILY TO CHECK BLOOD SUGAR 09/03/17  Yes Chundi, [name], MD  hydrALAZINE (APRESOLINE) 25 MG tablet Take 25 mg by mouth 3 (three) times daily.   Yes [provider]  JANUVIA 25 MG tablet TAKE 1 TABLET BY MOUTH EVERY DAY Patient taking differently: Take 25 mg by mouth daily.  06/12/18  Yes Chundi, Verne Spurr, MD  Lancets Misc. (ACCU-CHEK SOFTCLIX LANCET DEV) KIT Use to check blood sugar one time a day 09/03/17  Yes Chundi, [name], MD  lidocaine (LIDODERM) 5 % Place 1 patch onto the skin daily. Remove & Discard patch within 12 hours or as directed by MD 08/26/18  Yes Nils Flack, Mina A, PA-C  LUMIGAN 0.01 % SOLN Place 1 drop into both eyes at bedtime.  12/05/13  Yes [provider]  polyethylene glycol (MIRALAX) packet Take 17 g by mouth 2 (two) times daily. 03/25/17  Yes [name], MD  sertraline (ZOLOFT) 25 MG tablet Take 1 tablet (25 mg total) by mouth daily. 07/21/18  Yes Ledell Noss, MD  warfarin (COUMADIN) 5 MG tablet TAKE 1 TABLET BY MOUTH EVERY DAY AT 6 PM EXCEPT WEDNESDAY AND FRIDAY TAKE 1 & 1/2 TABLETS ON THOSE DAYS Patient taking differently: Take 5-7.5 mg by mouth See  admin instructions. TAKE 1 TABLET BY MOUTH EVERY DAY AT 6 PM EXCEPT [facility] AND FRIDAY TAKE 1 & 1/2 TABLETS ON THOSE DAYS 07/10/18  Yes Chundi, [name], MD  glucose blood (ACCU-CHEK AVIVA) test strip Use as instructed 05/03/11 09/03/18  Othella Boyer, MD   Past Medical History:  Diagnosis Date  . Anemia     normocytic anemia with baseline hemoglobin 10-11  . Blindness of left eye     likely related to stroke, left cataract removed from that eye with complications  . Calculus gallbladder and bile duct with cholecystitis with obstruction 01/2015  . Chronic kidney disease 2006    left renal artery stenosis with probable hemodynamic significance,  kidneys are normal in morphology without focal lesions or hydronephrosis this is based but cannot A. of the abdomen with and without contrast done on the 31st 2006  . CKD (chronic kidney disease), stage III ([place])   . CVA (cerebral vascular accident) ([place]) 1990's  . CVA (cerebrovascular accident) [facility])  October 2007    CT of the head atrophy with multiple remote insults noted but no definite acute findings  . Glaucoma   . Gout   . Hyperlipidemia   . Hypertension   . Personal history of DVT (deep vein thrombosis) 08/11/2010   BLE  . Type II diabetes mellitus ([place])    Social History   Socioeconomic History  . Marital status: Widowed    Spouse name: Not on file  . Number of children: Not on file  . Years of education: Not on file  . Highest education level: Not on file  Occupational History  . Not on file  Social Needs  . Financial resource strain: Not on file  . Food insecurity:    Worry: Not on file    Inability: Not on file  . Transportation needs:    Medical: Not on file    Non-medical: Not on file  Tobacco Use  . Smoking status: Never Smoker  . Smokeless tobacco: Never Used  Substance and Sexual Activity  . Alcohol use: No    Alcohol/week: 0.0 standard drinks  . Drug use: No  . Sexual activity: Never  Lifestyle  . Physical activity:    Days per week: Not on file    Minutes per session: Not on file  . Stress: Not on file  Relationships  . Social connections:    Talks on phone: Not on file    Gets together: Not on file    Attends religious service: Not on file    Active member of club or organization: Not on file    Attends meetings of clubs or organizations: Not on file    Relationship status: Not on file  Other Topics Concern  . Not on file  Social History Narrative    Patient is a widow. She has 11 children 5 of who are living. She is retired in 1993 from [employer]. She denies tobacco alcohol or drug use.   Family History  Problem Relation Age of Onset   . Heart disease Mother   . Diabetes Mother   . Hyperlipidemia Mother   . Hypertension Mother   . Heart disease Father   . Diabetes Father   . Hyperlipidemia Father   . Hypertension Father     ASSESSMENT Recent Results: The most recent result is correlated with 30 mg per week: Lab Results  Component Value Date   INR 2.1 09/04/2018   INR 2.01 08/26/2018   INR 2.1  08/07/2018    Anticoagulation Dosing: Description   Take 1 tablet by mouth once-daily at 6PM all days of week, EXCEPT on [facility]. Take only 1/2 tablet on [facility]  of each week. Repeat this regimen weekly until seen next in anticoagulation management clinic.      INR today: Therapeutic  PLAN Weekly dose was increased by 8% to 32.5 mg per week  Patient Instructions  Patient instructed to take medications as defined in the Anti-coagulation Track section of this encounter.  Patient instructed to take today's dose.  Patient instructed to take  1 tablet by mouth once-daily at 6PM all days of week, EXCEPT on [facility]. Take only 1/2 tablet on [facility]  of each week. Repeat this regimen weekly until seen next in anticoagulation management clinic.  Patient verbalized understanding of these instructions.     Patient advised to contact clinic or seek medical attention if signs/symptoms of bleeding or thromboembolism occur.  Patient verbalized understanding by repeating back information and was advised to contact me if further medication-related questions arise. Patient was also provided an information handout.  Follow-up Return in 4 weeks (on 10/02/2018) for Follow up INR at [place], PharmD, CPP  15 minutes spent face-to-face with the patient during the encounter. 50% of time spent on education. 50% of time was spent on fingerstick point of care INR sample collection, processing, results determination, dose adjustment and documentation in [email or web address]/.

## 2018-09-04 NOTE — Patient Instructions (Signed)
Patient instructed to take medications as defined in the Anti-coagulation Track section of this encounter.  Patient instructed to take today's dose.  Patient instructed to take  1 tablet by mouth once-daily at 6PM all days of week, EXCEPT on [facility]. Take only 1/2 tablet on [facility]  of each week. Repeat this regimen weekly until seen next in anticoagulation management clinic.  Patient verbalized understanding of these instructions.

## 2018-09-05 ENCOUNTER — Telehealth: Payer: Self-pay | Admitting: Internal Medicine

## 2018-09-05 DIAGNOSIS — Z9181 History of falling: Secondary | ICD-10-CM | POA: Diagnosis not present

## 2018-09-05 DIAGNOSIS — M6281 Muscle weakness (generalized): Secondary | ICD-10-CM | POA: Diagnosis not present

## 2018-09-05 DIAGNOSIS — M109 Gout, unspecified: Secondary | ICD-10-CM | POA: Diagnosis not present

## 2018-09-05 DIAGNOSIS — M1611 Unilateral primary osteoarthritis, right hip: Secondary | ICD-10-CM | POA: Diagnosis not present

## 2018-09-05 DIAGNOSIS — E1122 Type 2 diabetes mellitus with diabetic chronic kidney disease: Secondary | ICD-10-CM | POA: Diagnosis not present

## 2018-09-05 DIAGNOSIS — N184 Chronic kidney disease, stage 4 (severe): Secondary | ICD-10-CM | POA: Diagnosis not present

## 2018-09-05 DIAGNOSIS — H547 Unspecified visual loss: Secondary | ICD-10-CM | POA: Diagnosis not present

## 2018-09-05 DIAGNOSIS — Z86718 Personal history of other venous thrombosis and embolism: Secondary | ICD-10-CM | POA: Diagnosis not present

## 2018-09-05 DIAGNOSIS — D631 Anemia in chronic kidney disease: Secondary | ICD-10-CM | POA: Diagnosis not present

## 2018-09-05 DIAGNOSIS — I69398 Other sequelae of cerebral infarction: Secondary | ICD-10-CM | POA: Diagnosis not present

## 2018-09-05 DIAGNOSIS — Z7984 Long term (current) use of oral hypoglycemic drugs: Secondary | ICD-10-CM | POA: Diagnosis not present

## 2018-09-05 DIAGNOSIS — I447 Left bundle-branch block, unspecified: Secondary | ICD-10-CM | POA: Diagnosis not present

## 2018-09-05 DIAGNOSIS — I129 Hypertensive chronic kidney disease with stage 1 through stage 4 chronic kidney disease, or unspecified chronic kidney disease: Secondary | ICD-10-CM | POA: Diagnosis not present

## 2018-09-05 DIAGNOSIS — Z7901 Long term (current) use of anticoagulants: Secondary | ICD-10-CM | POA: Diagnosis not present

## 2018-09-05 NOTE — Telephone Encounter (Signed)
Talked to pt and her daughter. Pt has an appt on Friday w/ Dr Maricela Bo. Asked pt's daughter to keep a record of pt's BP and bring them her appt on Friday. But to call the office if BP remains elevated. Voiced understanding.

## 2018-09-05 NOTE — Telephone Encounter (Signed)
Chelsea Mcdaniel [phone] is calling about a transport chair from [facility]

## 2018-09-05 NOTE — Telephone Encounter (Signed)
Thank you Glenda... 

## 2018-09-05 NOTE — Telephone Encounter (Signed)
Patient's blood pressure should be checked again  And if it continues to be elevated then she needs to come in to be seen for acc appointment.

## 2018-09-05 NOTE — Telephone Encounter (Signed)
Call from [place] [facility], [place] - stated pt's BP is 200/80 and it is usually high when she takes it. Stated her caregiver gave pt her medications @ 12Noon. FYI

## 2018-09-06 ENCOUNTER — Telehealth: Payer: Self-pay

## 2018-09-06 NOTE — Telephone Encounter (Signed)
Chelsea Mcdaniel with [facility], want to informed the doctor PT/OT and nursing is seeing pt at home.

## 2018-09-06 NOTE — Telephone Encounter (Signed)
Why is the patient not being seen?

## 2018-09-06 NOTE — Progress Notes (Addendum)
   CC: Diabetes Follow up  HPI:  [name] is a 85 y.o. with hypertension, ckd 4, dm 2, h/o unprovoked dvts on chronic anticoagulation who presents for diabetes follow up. Please see problem based charting for evaluation, assessment, and plan.  Past Medical History:  Diagnosis Date  . Anemia     normocytic anemia with baseline hemoglobin 10-11  . Blindness of left eye     likely related to stroke, left cataract removed from that eye with complications  . Calculus gallbladder and bile duct with cholecystitis with obstruction 01/2015  . Chronic kidney disease 2006    left renal artery stenosis with probable hemodynamic significance, kidneys are normal in morphology without focal lesions or hydronephrosis this is based but cannot A. of the abdomen with and without contrast done on the 31st 2006  . CKD (chronic kidney disease), stage III ([place])   . CVA (cerebral vascular accident) ([place]) 1990's  . CVA (cerebrovascular accident) [facility])  October 2007    CT of the head atrophy with multiple remote insults noted but no definite acute findings  . Glaucoma   . Gout   . Hyperlipidemia   . Hypertension   . Personal history of DVT (deep vein thrombosis) 08/11/2010   BLE  . Type II diabetes mellitus ([facility])    Review of Systems:   Unremarkable as above   Physical Exam:  Vitals:   09/08/18 1547  BP: (!) 213/77  Pulse: 64  Temp: (!) 97.4 F (36.3 C)  TempSrc: Oral  SpO2: 100%  Weight: 124 lb 6.4 oz (56.4 kg)   Physical Exam  Constitutional: Appears well-developed and well-nourished. No distress.  HENT:  Head: Normocephalic and atraumatic.  Eyes: Conjunctivae are normal.  Cardiovascular: Normal rate, regular rhythm and normal heart sounds.  Respiratory: Effort normal and breath sounds normal. No respiratory distress. No wheezes.  GI: Soft. Bowel sounds are normal. No distension. There is no tenderness.  Musculoskeletal: No edema.  Neurological: Is alert.  Skin: Not diaphoretic.  No erythema.  Psychiatric: Slowed, not as communicative    Assessment & Plan:   See Encounters Tab for problem based charting.  Patient discussed with Dr. He[place]

## 2018-09-06 NOTE — Telephone Encounter (Signed)
Sorry, I misread and thought you said she is not getting pt/ot and nursing support. Good to see she is. Thank you, sorry for confusion again!

## 2018-09-07 ENCOUNTER — Telehealth: Payer: Self-pay | Admitting: *Deleted

## 2018-09-07 DIAGNOSIS — M6281 Muscle weakness (generalized): Secondary | ICD-10-CM | POA: Diagnosis not present

## 2018-09-07 DIAGNOSIS — I447 Left bundle-branch block, unspecified: Secondary | ICD-10-CM | POA: Diagnosis not present

## 2018-09-07 DIAGNOSIS — M109 Gout, unspecified: Secondary | ICD-10-CM | POA: Diagnosis not present

## 2018-09-07 DIAGNOSIS — I69398 Other sequelae of cerebral infarction: Secondary | ICD-10-CM | POA: Diagnosis not present

## 2018-09-07 DIAGNOSIS — I129 Hypertensive chronic kidney disease with stage 1 through stage 4 chronic kidney disease, or unspecified chronic kidney disease: Secondary | ICD-10-CM | POA: Diagnosis not present

## 2018-09-07 DIAGNOSIS — N184 Chronic kidney disease, stage 4 (severe): Secondary | ICD-10-CM | POA: Diagnosis not present

## 2018-09-07 DIAGNOSIS — Z7901 Long term (current) use of anticoagulants: Secondary | ICD-10-CM | POA: Diagnosis not present

## 2018-09-07 DIAGNOSIS — Z7984 Long term (current) use of oral hypoglycemic drugs: Secondary | ICD-10-CM | POA: Diagnosis not present

## 2018-09-07 DIAGNOSIS — E1122 Type 2 diabetes mellitus with diabetic chronic kidney disease: Secondary | ICD-10-CM | POA: Diagnosis not present

## 2018-09-07 DIAGNOSIS — Z86718 Personal history of other venous thrombosis and embolism: Secondary | ICD-10-CM | POA: Diagnosis not present

## 2018-09-07 DIAGNOSIS — H547 Unspecified visual loss: Secondary | ICD-10-CM | POA: Diagnosis not present

## 2018-09-07 DIAGNOSIS — Z9181 History of falling: Secondary | ICD-10-CM | POA: Diagnosis not present

## 2018-09-07 DIAGNOSIS — M1611 Unilateral primary osteoarthritis, right hip: Secondary | ICD-10-CM | POA: Diagnosis not present

## 2018-09-07 DIAGNOSIS — D631 Anemia in chronic kidney disease: Secondary | ICD-10-CM | POA: Diagnosis not present

## 2018-09-07 NOTE — Telephone Encounter (Signed)
[place] [facility] calls and states pt BP today was 200/80, pt has no complaints of distress, denies h/a, short of breath, chest pain. Caregiver states pt is taking all meds but not at same time daily, clonidine patch is to be changed today but has not done so, she states she will do it soon. [place] PT states visits will cease until BP is being controlled Pt has appt 2/14 and caregiver states she will be here and will bring meds with her

## 2018-09-08 ENCOUNTER — Encounter: Payer: Self-pay | Admitting: Internal Medicine

## 2018-09-08 ENCOUNTER — Other Ambulatory Visit: Payer: Self-pay

## 2018-09-08 ENCOUNTER — Ambulatory Visit (INDEPENDENT_AMBULATORY_CARE_PROVIDER_SITE_OTHER): Payer: Medicare Other | Admitting: Internal Medicine

## 2018-09-08 VITALS — BP 213/77 | HR 64 | Temp 97.4°F | Wt 124.4 lb

## 2018-09-08 DIAGNOSIS — I152 Hypertension secondary to endocrine disorders: Secondary | ICD-10-CM

## 2018-09-08 DIAGNOSIS — Z7189 Other specified counseling: Secondary | ICD-10-CM

## 2018-09-08 DIAGNOSIS — N184 Chronic kidney disease, stage 4 (severe): Secondary | ICD-10-CM | POA: Diagnosis not present

## 2018-09-08 DIAGNOSIS — E1159 Type 2 diabetes mellitus with other circulatory complications: Secondary | ICD-10-CM | POA: Diagnosis not present

## 2018-09-08 DIAGNOSIS — H547 Unspecified visual loss: Secondary | ICD-10-CM | POA: Diagnosis not present

## 2018-09-08 DIAGNOSIS — I69398 Other sequelae of cerebral infarction: Secondary | ICD-10-CM | POA: Diagnosis not present

## 2018-09-08 DIAGNOSIS — M1611 Unilateral primary osteoarthritis, right hip: Secondary | ICD-10-CM | POA: Diagnosis not present

## 2018-09-08 DIAGNOSIS — I447 Left bundle-branch block, unspecified: Secondary | ICD-10-CM | POA: Diagnosis not present

## 2018-09-08 DIAGNOSIS — Z7984 Long term (current) use of oral hypoglycemic drugs: Secondary | ICD-10-CM | POA: Diagnosis not present

## 2018-09-08 DIAGNOSIS — M6281 Muscle weakness (generalized): Secondary | ICD-10-CM | POA: Diagnosis not present

## 2018-09-08 DIAGNOSIS — E1122 Type 2 diabetes mellitus with diabetic chronic kidney disease: Secondary | ICD-10-CM | POA: Diagnosis not present

## 2018-09-08 DIAGNOSIS — D631 Anemia in chronic kidney disease: Secondary | ICD-10-CM | POA: Diagnosis not present

## 2018-09-08 DIAGNOSIS — Z9181 History of falling: Secondary | ICD-10-CM | POA: Diagnosis not present

## 2018-09-08 DIAGNOSIS — Z86718 Personal history of other venous thrombosis and embolism: Secondary | ICD-10-CM

## 2018-09-08 DIAGNOSIS — Z7901 Long term (current) use of anticoagulants: Secondary | ICD-10-CM | POA: Diagnosis not present

## 2018-09-08 DIAGNOSIS — M109 Gout, unspecified: Secondary | ICD-10-CM | POA: Diagnosis not present

## 2018-09-08 DIAGNOSIS — I10 Essential (primary) hypertension: Secondary | ICD-10-CM

## 2018-09-08 DIAGNOSIS — I129 Hypertensive chronic kidney disease with stage 1 through stage 4 chronic kidney disease, or unspecified chronic kidney disease: Secondary | ICD-10-CM | POA: Diagnosis not present

## 2018-09-08 MED ORDER — AMLODIPINE BESYLATE 10 MG PO TABS: 10.0000 mg | ORAL_TABLET | Freq: Every day | ORAL | 0 refills | Status: DC

## 2018-09-08 MED ORDER — DICLOFENAC SODIUM 1 % TD GEL: 2.0000 g | Freq: Four times a day (QID) | TRANSDERMAL | 1 refills | Status: AC

## 2018-09-08 MED ORDER — CLONIDINE 0.3 MG/24HR TD PTWK: 0.3000 mg | MEDICATED_PATCH | TRANSDERMAL | 0 refills | Status: DC

## 2018-09-08 NOTE — Patient Instructions (Addendum)
It was a pleasure to see you today Chelsea Mcdaniel. Please make the following changes:  -Please stop taking hydralazine -please start using increased dose of clonidine patch (clonidine 0.3mg  weekly) -please continue lasix  -please take increased dose of amlodipine 10mg  daily  -please consider [facility]ACE for daytime supervision -please use voltaren gel and heating pad on hip  If you have any questions or concerns, please call our clinic at [phone] between 9am-5pm and after hours call [phone] and ask for the internal medicine resident on call. If you feel you are having a medical emergency please call 911.   Thank you, we look forward to help you remain healthy!  [name], MD Internal Medicine PGY2

## 2018-09-10 NOTE — Assessment & Plan Note (Addendum)
Patient was seen by Dr. Elie Confer on 2/10 and recommended to take warfarin 5mg  all days except for Wednesday when he is to take 2.5mg .

## 2018-09-10 NOTE — Assessment & Plan Note (Signed)
Since her last hospitalization the patient has had a change in functional and mental status. She appears more confused. Her daughter in law mentions that Chelsea Mcdaniel appears to be "lost in thought" and when pressurized to tell about her thought she mentions that she has thoughts about hurting people.   Chelsea Mcdaniel son, daughter in law, and daughter along with home health providers have been alternating in providing 24 hour supervision.   Assessment and plan  Recommended [facility] services to help the patient's family in providing care for Chelsea Mcdaniel. Made referral to Ms. Dessie Coma (Behavioral health) in order to assist with the patient's mood and thoughts.

## 2018-09-10 NOTE — Assessment & Plan Note (Signed)
The patient's blood pressure during this visit was 213/77. The patient is currently taking clonidine 0.2mg  weekly, amlodipine 5mg  qd, hydralazine 25mg  tid, and lasix 80mg  qd His last blood pressure visits are   BP Readings from Last 3 Encounters:  09/08/18 (!) 213/77  08/26/18 (!) 148/94  08/17/18 (!) 202/67  The patient was recently admitted in December 2019 after a fall that was thought to be secondary to orthostatic hypotension.   The patient's daughter in law who accompanied the patient stated that the patient is not wanting to take her medication despite her family crushing the medications.   Assessment and Plan The patient's blood pressure remains elevated likely due to nonadherence of medication. I spoke to patient and daughter in law at length about medication compliance. In order to help them we decreased the pill burden.   -hydralazine was stopped due to pill burden  -clonidine was increased from 0.2mg  weekly to 0.3mg  weekly -amlodipine was increased to 10mg  daily -continue lasix 80mg  daily -recommended the patient's medication be given by one family member daily in order to prevent confusion about medication administration.

## 2018-09-10 NOTE — Assessment & Plan Note (Signed)
The patient's last a1c=6.7 in December 2019. The patient is currently taking [place] 25mg  qd  -Will recheck a1c at next visit in 1 month.

## 2018-09-11 DIAGNOSIS — Z86718 Personal history of other venous thrombosis and embolism: Secondary | ICD-10-CM | POA: Diagnosis not present

## 2018-09-11 DIAGNOSIS — I69398 Other sequelae of cerebral infarction: Secondary | ICD-10-CM | POA: Diagnosis not present

## 2018-09-11 DIAGNOSIS — I129 Hypertensive chronic kidney disease with stage 1 through stage 4 chronic kidney disease, or unspecified chronic kidney disease: Secondary | ICD-10-CM | POA: Diagnosis not present

## 2018-09-11 DIAGNOSIS — Z7901 Long term (current) use of anticoagulants: Secondary | ICD-10-CM | POA: Diagnosis not present

## 2018-09-11 DIAGNOSIS — H547 Unspecified visual loss: Secondary | ICD-10-CM | POA: Diagnosis not present

## 2018-09-11 DIAGNOSIS — D631 Anemia in chronic kidney disease: Secondary | ICD-10-CM | POA: Diagnosis not present

## 2018-09-11 DIAGNOSIS — M109 Gout, unspecified: Secondary | ICD-10-CM | POA: Diagnosis not present

## 2018-09-11 DIAGNOSIS — N184 Chronic kidney disease, stage 4 (severe): Secondary | ICD-10-CM | POA: Diagnosis not present

## 2018-09-11 DIAGNOSIS — M1611 Unilateral primary osteoarthritis, right hip: Secondary | ICD-10-CM | POA: Diagnosis not present

## 2018-09-11 DIAGNOSIS — E1122 Type 2 diabetes mellitus with diabetic chronic kidney disease: Secondary | ICD-10-CM | POA: Diagnosis not present

## 2018-09-11 DIAGNOSIS — M6281 Muscle weakness (generalized): Secondary | ICD-10-CM | POA: Diagnosis not present

## 2018-09-11 DIAGNOSIS — I447 Left bundle-branch block, unspecified: Secondary | ICD-10-CM | POA: Diagnosis not present

## 2018-09-11 DIAGNOSIS — Z7984 Long term (current) use of oral hypoglycemic drugs: Secondary | ICD-10-CM | POA: Diagnosis not present

## 2018-09-11 DIAGNOSIS — Z9181 History of falling: Secondary | ICD-10-CM | POA: Diagnosis not present

## 2018-09-12 NOTE — Progress Notes (Signed)
Internal Medicine Clinic Attending  Case discussed with Dr. Chundi at the time of the visit.  We reviewed the resident's history and exam and pertinent patient test results.  I agree with the assessment, diagnosis, and plan of care documented in the resident's note. 

## 2018-09-19 DIAGNOSIS — Z7984 Long term (current) use of oral hypoglycemic drugs: Secondary | ICD-10-CM | POA: Diagnosis not present

## 2018-09-19 DIAGNOSIS — I447 Left bundle-branch block, unspecified: Secondary | ICD-10-CM | POA: Diagnosis not present

## 2018-09-19 DIAGNOSIS — E1122 Type 2 diabetes mellitus with diabetic chronic kidney disease: Secondary | ICD-10-CM | POA: Diagnosis not present

## 2018-09-19 DIAGNOSIS — Z9181 History of falling: Secondary | ICD-10-CM | POA: Diagnosis not present

## 2018-09-19 DIAGNOSIS — M109 Gout, unspecified: Secondary | ICD-10-CM | POA: Diagnosis not present

## 2018-09-19 DIAGNOSIS — M1611 Unilateral primary osteoarthritis, right hip: Secondary | ICD-10-CM | POA: Diagnosis not present

## 2018-09-19 DIAGNOSIS — I129 Hypertensive chronic kidney disease with stage 1 through stage 4 chronic kidney disease, or unspecified chronic kidney disease: Secondary | ICD-10-CM | POA: Diagnosis not present

## 2018-09-19 DIAGNOSIS — Z7901 Long term (current) use of anticoagulants: Secondary | ICD-10-CM | POA: Diagnosis not present

## 2018-09-19 DIAGNOSIS — D631 Anemia in chronic kidney disease: Secondary | ICD-10-CM | POA: Diagnosis not present

## 2018-09-19 DIAGNOSIS — N184 Chronic kidney disease, stage 4 (severe): Secondary | ICD-10-CM | POA: Diagnosis not present

## 2018-09-19 DIAGNOSIS — I69398 Other sequelae of cerebral infarction: Secondary | ICD-10-CM | POA: Diagnosis not present

## 2018-09-19 DIAGNOSIS — H547 Unspecified visual loss: Secondary | ICD-10-CM | POA: Diagnosis not present

## 2018-09-19 DIAGNOSIS — M6281 Muscle weakness (generalized): Secondary | ICD-10-CM | POA: Diagnosis not present

## 2018-09-19 DIAGNOSIS — Z86718 Personal history of other venous thrombosis and embolism: Secondary | ICD-10-CM | POA: Diagnosis not present

## 2018-09-21 ENCOUNTER — Other Ambulatory Visit: Payer: Self-pay | Admitting: Internal Medicine

## 2018-09-21 DIAGNOSIS — Z9181 History of falling: Secondary | ICD-10-CM | POA: Diagnosis not present

## 2018-09-21 DIAGNOSIS — I69398 Other sequelae of cerebral infarction: Secondary | ICD-10-CM | POA: Diagnosis not present

## 2018-09-21 DIAGNOSIS — I129 Hypertensive chronic kidney disease with stage 1 through stage 4 chronic kidney disease, or unspecified chronic kidney disease: Secondary | ICD-10-CM | POA: Diagnosis not present

## 2018-09-21 DIAGNOSIS — M1611 Unilateral primary osteoarthritis, right hip: Secondary | ICD-10-CM | POA: Diagnosis not present

## 2018-09-21 DIAGNOSIS — Z7984 Long term (current) use of oral hypoglycemic drugs: Secondary | ICD-10-CM | POA: Diagnosis not present

## 2018-09-21 DIAGNOSIS — D631 Anemia in chronic kidney disease: Secondary | ICD-10-CM | POA: Diagnosis not present

## 2018-09-21 DIAGNOSIS — H547 Unspecified visual loss: Secondary | ICD-10-CM | POA: Diagnosis not present

## 2018-09-21 DIAGNOSIS — M109 Gout, unspecified: Secondary | ICD-10-CM | POA: Diagnosis not present

## 2018-09-21 DIAGNOSIS — N184 Chronic kidney disease, stage 4 (severe): Secondary | ICD-10-CM | POA: Diagnosis not present

## 2018-09-21 DIAGNOSIS — Z7901 Long term (current) use of anticoagulants: Secondary | ICD-10-CM | POA: Diagnosis not present

## 2018-09-21 DIAGNOSIS — E1122 Type 2 diabetes mellitus with diabetic chronic kidney disease: Secondary | ICD-10-CM | POA: Diagnosis not present

## 2018-09-21 DIAGNOSIS — M6281 Muscle weakness (generalized): Secondary | ICD-10-CM | POA: Diagnosis not present

## 2018-09-21 DIAGNOSIS — I447 Left bundle-branch block, unspecified: Secondary | ICD-10-CM | POA: Diagnosis not present

## 2018-09-21 DIAGNOSIS — Z86718 Personal history of other venous thrombosis and embolism: Secondary | ICD-10-CM | POA: Diagnosis not present

## 2018-09-26 DIAGNOSIS — Z9181 History of falling: Secondary | ICD-10-CM | POA: Diagnosis not present

## 2018-09-26 DIAGNOSIS — N184 Chronic kidney disease, stage 4 (severe): Secondary | ICD-10-CM | POA: Diagnosis not present

## 2018-09-26 DIAGNOSIS — Z7901 Long term (current) use of anticoagulants: Secondary | ICD-10-CM | POA: Diagnosis not present

## 2018-09-26 DIAGNOSIS — H547 Unspecified visual loss: Secondary | ICD-10-CM | POA: Diagnosis not present

## 2018-09-26 DIAGNOSIS — Z7984 Long term (current) use of oral hypoglycemic drugs: Secondary | ICD-10-CM | POA: Diagnosis not present

## 2018-09-26 DIAGNOSIS — M109 Gout, unspecified: Secondary | ICD-10-CM | POA: Diagnosis not present

## 2018-09-26 DIAGNOSIS — D631 Anemia in chronic kidney disease: Secondary | ICD-10-CM | POA: Diagnosis not present

## 2018-09-26 DIAGNOSIS — M6281 Muscle weakness (generalized): Secondary | ICD-10-CM | POA: Diagnosis not present

## 2018-09-26 DIAGNOSIS — I69398 Other sequelae of cerebral infarction: Secondary | ICD-10-CM | POA: Diagnosis not present

## 2018-09-26 DIAGNOSIS — I447 Left bundle-branch block, unspecified: Secondary | ICD-10-CM | POA: Diagnosis not present

## 2018-09-26 DIAGNOSIS — I129 Hypertensive chronic kidney disease with stage 1 through stage 4 chronic kidney disease, or unspecified chronic kidney disease: Secondary | ICD-10-CM | POA: Diagnosis not present

## 2018-09-26 DIAGNOSIS — E1122 Type 2 diabetes mellitus with diabetic chronic kidney disease: Secondary | ICD-10-CM | POA: Diagnosis not present

## 2018-09-26 DIAGNOSIS — Z86718 Personal history of other venous thrombosis and embolism: Secondary | ICD-10-CM | POA: Diagnosis not present

## 2018-09-26 DIAGNOSIS — M1611 Unilateral primary osteoarthritis, right hip: Secondary | ICD-10-CM | POA: Diagnosis not present

## 2018-09-28 DIAGNOSIS — Z86718 Personal history of other venous thrombosis and embolism: Secondary | ICD-10-CM | POA: Diagnosis not present

## 2018-09-28 DIAGNOSIS — D631 Anemia in chronic kidney disease: Secondary | ICD-10-CM | POA: Diagnosis not present

## 2018-09-28 DIAGNOSIS — Z9181 History of falling: Secondary | ICD-10-CM | POA: Diagnosis not present

## 2018-09-28 DIAGNOSIS — E1122 Type 2 diabetes mellitus with diabetic chronic kidney disease: Secondary | ICD-10-CM | POA: Diagnosis not present

## 2018-09-28 DIAGNOSIS — Z7984 Long term (current) use of oral hypoglycemic drugs: Secondary | ICD-10-CM | POA: Diagnosis not present

## 2018-09-28 DIAGNOSIS — N184 Chronic kidney disease, stage 4 (severe): Secondary | ICD-10-CM | POA: Diagnosis not present

## 2018-09-28 DIAGNOSIS — I129 Hypertensive chronic kidney disease with stage 1 through stage 4 chronic kidney disease, or unspecified chronic kidney disease: Secondary | ICD-10-CM | POA: Diagnosis not present

## 2018-09-28 DIAGNOSIS — I69398 Other sequelae of cerebral infarction: Secondary | ICD-10-CM | POA: Diagnosis not present

## 2018-09-28 DIAGNOSIS — M109 Gout, unspecified: Secondary | ICD-10-CM | POA: Diagnosis not present

## 2018-09-28 DIAGNOSIS — I447 Left bundle-branch block, unspecified: Secondary | ICD-10-CM | POA: Diagnosis not present

## 2018-09-28 DIAGNOSIS — H547 Unspecified visual loss: Secondary | ICD-10-CM | POA: Diagnosis not present

## 2018-09-28 DIAGNOSIS — M1611 Unilateral primary osteoarthritis, right hip: Secondary | ICD-10-CM | POA: Diagnosis not present

## 2018-09-28 DIAGNOSIS — Z7901 Long term (current) use of anticoagulants: Secondary | ICD-10-CM | POA: Diagnosis not present

## 2018-09-28 DIAGNOSIS — M6281 Muscle weakness (generalized): Secondary | ICD-10-CM | POA: Diagnosis not present

## 2018-09-29 DIAGNOSIS — N184 Chronic kidney disease, stage 4 (severe): Secondary | ICD-10-CM | POA: Diagnosis not present

## 2018-09-29 DIAGNOSIS — I129 Hypertensive chronic kidney disease with stage 1 through stage 4 chronic kidney disease, or unspecified chronic kidney disease: Secondary | ICD-10-CM | POA: Diagnosis not present

## 2018-09-29 DIAGNOSIS — M109 Gout, unspecified: Secondary | ICD-10-CM | POA: Diagnosis not present

## 2018-09-29 DIAGNOSIS — I69398 Other sequelae of cerebral infarction: Secondary | ICD-10-CM | POA: Diagnosis not present

## 2018-09-29 DIAGNOSIS — Z7901 Long term (current) use of anticoagulants: Secondary | ICD-10-CM | POA: Diagnosis not present

## 2018-09-29 DIAGNOSIS — M6281 Muscle weakness (generalized): Secondary | ICD-10-CM | POA: Diagnosis not present

## 2018-09-29 DIAGNOSIS — H547 Unspecified visual loss: Secondary | ICD-10-CM | POA: Diagnosis not present

## 2018-09-29 DIAGNOSIS — D631 Anemia in chronic kidney disease: Secondary | ICD-10-CM | POA: Diagnosis not present

## 2018-09-29 DIAGNOSIS — Z7984 Long term (current) use of oral hypoglycemic drugs: Secondary | ICD-10-CM | POA: Diagnosis not present

## 2018-09-29 DIAGNOSIS — I447 Left bundle-branch block, unspecified: Secondary | ICD-10-CM | POA: Diagnosis not present

## 2018-09-29 DIAGNOSIS — Z9181 History of falling: Secondary | ICD-10-CM | POA: Diagnosis not present

## 2018-09-29 DIAGNOSIS — E1122 Type 2 diabetes mellitus with diabetic chronic kidney disease: Secondary | ICD-10-CM | POA: Diagnosis not present

## 2018-09-29 DIAGNOSIS — M1611 Unilateral primary osteoarthritis, right hip: Secondary | ICD-10-CM | POA: Diagnosis not present

## 2018-09-29 DIAGNOSIS — Z86718 Personal history of other venous thrombosis and embolism: Secondary | ICD-10-CM | POA: Diagnosis not present

## 2018-10-02 ENCOUNTER — Ambulatory Visit: Payer: Medicare Other | Admitting: Pharmacist

## 2018-10-02 ENCOUNTER — Ambulatory Visit (INDEPENDENT_AMBULATORY_CARE_PROVIDER_SITE_OTHER): Payer: Medicare Other | Admitting: Pharmacist

## 2018-10-02 ENCOUNTER — Encounter: Payer: Self-pay | Admitting: Pharmacist

## 2018-10-02 DIAGNOSIS — M1611 Unilateral primary osteoarthritis, right hip: Secondary | ICD-10-CM | POA: Diagnosis not present

## 2018-10-02 DIAGNOSIS — Z5181 Encounter for therapeutic drug level monitoring: Secondary | ICD-10-CM

## 2018-10-02 DIAGNOSIS — I129 Hypertensive chronic kidney disease with stage 1 through stage 4 chronic kidney disease, or unspecified chronic kidney disease: Secondary | ICD-10-CM | POA: Diagnosis not present

## 2018-10-02 DIAGNOSIS — Z7984 Long term (current) use of oral hypoglycemic drugs: Secondary | ICD-10-CM | POA: Diagnosis not present

## 2018-10-02 DIAGNOSIS — D631 Anemia in chronic kidney disease: Secondary | ICD-10-CM | POA: Diagnosis not present

## 2018-10-02 DIAGNOSIS — E1122 Type 2 diabetes mellitus with diabetic chronic kidney disease: Secondary | ICD-10-CM | POA: Diagnosis not present

## 2018-10-02 DIAGNOSIS — I69398 Other sequelae of cerebral infarction: Secondary | ICD-10-CM | POA: Diagnosis not present

## 2018-10-02 DIAGNOSIS — Z7901 Long term (current) use of anticoagulants: Secondary | ICD-10-CM

## 2018-10-02 DIAGNOSIS — M6281 Muscle weakness (generalized): Secondary | ICD-10-CM | POA: Diagnosis not present

## 2018-10-02 DIAGNOSIS — Z86718 Personal history of other venous thrombosis and embolism: Secondary | ICD-10-CM

## 2018-10-02 DIAGNOSIS — Z9181 History of falling: Secondary | ICD-10-CM | POA: Diagnosis not present

## 2018-10-02 DIAGNOSIS — N184 Chronic kidney disease, stage 4 (severe): Secondary | ICD-10-CM | POA: Diagnosis not present

## 2018-10-02 DIAGNOSIS — I447 Left bundle-branch block, unspecified: Secondary | ICD-10-CM | POA: Diagnosis not present

## 2018-10-02 DIAGNOSIS — M109 Gout, unspecified: Secondary | ICD-10-CM | POA: Diagnosis not present

## 2018-10-02 DIAGNOSIS — H547 Unspecified visual loss: Secondary | ICD-10-CM | POA: Diagnosis not present

## 2018-10-02 LAB — POCT INR: INR: 2 (ref 2.0–3.0)

## 2018-10-02 NOTE — Progress Notes (Signed)
Anticoagulation Management Chelsea Mcdaniel is a 85 y.o. female who reports to the clinic for monitoring of warfarin treatment.    Indication: DVT, history of; Long term current use of anticoagulant.  Duration: indefinite Supervising physician: Lenice Pressman, MD PhD  Anticoagulation Clinic Visit History: Patient does not report signs/symptoms of bleeding or thromboembolism  Other recent changes: No diet, medications, lifestyle changes.  Anticoagulation Episode Summary    Current INR goal:   2.0-3.0  TTR:   74.6 % (4.7 y)  Next INR check:   10/30/2018  INR from last check:     Weekly max warfarin dose:     Target end date:     INR check location:     Preferred lab:     Send INR reminders to:      Indications   EMBOLISM/THROMBOSIS DEEP VSL LWR EXTRM NOS (Resolved) [I82.409] Long-term (current) use of anticoagulants [Z79.01]       Comments:           Allergies  Allergen Reactions  . Diltiazem Other (See Comments)    Symptomatic bradaycardia  . Metoprolol Other (See Comments)    Symptomatic bradycardia   Prior to Admission medications   Medication Sig Start Date End Date Taking? Authorizing Provider  ACCU-CHEK SOFTCLIX LANCETS lancets Used to check blood sugar 3 times daily. 08/30/18  Yes Chundi, [name], MD  amLODipine (NORVASC) 10 MG tablet Take 1 tablet (10 mg total) by mouth daily. 09/08/18 12/07/18 Yes Chundi, [name], MD  Blood Glucose Monitoring Suppl (ACCU-CHEK AVIVA PLUS) w/Device KIT Check blood sugar 1 time a day 03/08/18  Yes Chundi, [name], MD  cloNIDine (CATAPRES - DOSED IN MG/24 HR) 0.3 mg/24hr patch Place 1 patch (0.3 mg total) onto the skin once a week for 10 doses. 09/08/18 11/11/18 Yes Chundi, [name], MD  diclofenac sodium (VOLTAREN) 1 % GEL Apply 2 g topically 4 (four) times daily. 09/08/18 12/07/18 Yes Chundi, [name], MD  dorzolamide-timolol (COSOPT) 22.3-6.8 MG/ML ophthalmic solution Place 1 drop into both eyes 2 (two) times daily. 05/16/18  Yes [provider]  furosemide (LASIX) 10 MG/ML solution Take 8 mLs (80 mg total) by mouth daily as needed for edema. 07/14/18  Yes Agyei, Obed K, MD  glucose blood (ACCU-CHEK AVIVA PLUS) test strip USE 1 TIME DAILY TO CHECK BLOOD SUGAR 09/03/17  Yes Chundi, [name], MD  JANUVIA 25 MG tablet TAKE 1 TABLET BY MOUTH EVERY DAY Patient taking differently: Take 25 mg by mouth daily.  06/12/18  Yes Chundi, Verne Spurr, MD  Lancets Misc. (ACCU-CHEK SOFTCLIX LANCET DEV) KIT Use to check blood sugar one time a day 09/03/17  Yes Chundi, [name], MD  lidocaine (LIDODERM) 5 % Place 1 patch onto the skin daily. Remove & Discard patch within 12 hours or as directed by MD 08/26/18  Yes Nils Flack, Mina A, PA-C  LUMIGAN 0.01 % SOLN Place 1 drop into both eyes at bedtime.  12/05/13  Yes [provider]  polyethylene glycol (MIRALAX) packet Take 17 g by mouth 2 (two) times daily. 03/25/17  Yes [name], MD  sertraline (ZOLOFT) 25 MG tablet Take 1 tablet (25 mg total) by mouth daily. 07/21/18  Yes Ledell Noss, MD  warfarin (COUMADIN) 5 MG tablet TAKE 1 TABLET BY MOUTH EVERY DAY AT 6 PM EXCEPT WEDNESDAY AND FRIDAY TAKE 1 & 1/2 TABLETS ON THOSE DAYS Patient taking differently: Take 5-7.5 mg by mouth See admin instructions. TAKE 1 TABLET BY MOUTH EVERY DAY AT 6 PM EXCEPT [facility] AND  FRIDAY TAKE 1 & 1/2 TABLETS ON THOSE DAYS 07/10/18  Yes Chundi, [name], MD  allopurinol (ZYLOPRIM) 100 MG tablet Take 1 tablet (100 mg total) by mouth daily. 06/12/18 09/10/18  [name], MD  glucose blood (ACCU-CHEK AVIVA) test strip Use as instructed 05/03/11 09/03/18  Othella Boyer, MD   Past Medical History:  Diagnosis Date  . Anemia     normocytic anemia with baseline hemoglobin 10-11  . Blindness of left eye     likely related to stroke, left cataract removed from that eye with complications  . Calculus gallbladder and bile duct with cholecystitis with obstruction 01/2015  . Chronic kidney disease 2006    left renal artery  stenosis with probable hemodynamic significance, kidneys are normal in morphology without focal lesions or hydronephrosis this is based but cannot A. of the abdomen with and without contrast done on the 31st 2006  . CKD (chronic kidney disease), stage III ([place])   . CVA (cerebral vascular accident) ([place]) 1990's  . CVA (cerebrovascular accident) [facility])  October 2007    CT of the head atrophy with multiple remote insults noted but no definite acute findings  . Glaucoma   . Gout   . Hyperlipidemia   . Hypertension   . Personal history of DVT (deep vein thrombosis) 08/11/2010   BLE  . Type II diabetes mellitus ([place])    Social History   Socioeconomic History  . Marital status: Widowed    Spouse name: Not on file  . Number of children: Not on file  . Years of education: Not on file  . Highest education level: Not on file  Occupational History  . Not on file  Social Needs  . Financial resource strain: Not on file  . Food insecurity:    Worry: Not on file    Inability: Not on file  . Transportation needs:    Medical: Not on file    Non-medical: Not on file  Tobacco Use  . Smoking status: Never Smoker  . Smokeless tobacco: Never Used  Substance and Sexual Activity  . Alcohol use: No    Alcohol/week: 0.0 standard drinks  . Drug use: No  . Sexual activity: Not Currently  Lifestyle  . Physical activity:    Days per week: Not on file    Minutes per session: Not on file  . Stress: Not on file  Relationships  . Social connections:    Talks on phone: Not on file    Gets together: Not on file    Attends religious service: Not on file    Active member of club or organization: Not on file    Attends meetings of clubs or organizations: Not on file    Relationship status: Not on file  Other Topics Concern  . Not on file  Social History Narrative    Patient is a widow. She has 11 children 5 of who are living. She is retired in 1993 from [employer]. She denies tobacco alcohol or drug  use.   Family History  Problem Relation Age of Onset  . Heart disease Mother   . Diabetes Mother   . Hyperlipidemia Mother   . Hypertension Mother   . Heart disease Father   . Diabetes Father   . Hyperlipidemia Father   . Hypertension Father     ASSESSMENT Recent Results: The most recent result is correlated with 32.5 mg per week: Lab Results  Component Value Date   INR 2.0 10/02/2018  INR 2.1 09/04/2018   INR 2.01 08/26/2018    Anticoagulation Dosing: Description   Take 1 tablet by mouth once-daily at 6PM all days of week.     INR today: Therapeutic  PLAN Weekly dose was increased by 8% to 35 mg per week  Patient Instructions  Patient instructed to take medications as defined in the Anti-coagulation Track section of this encounter.  Patient instructed to take today's dose.  Patient instructed to take  1 tablet by mouth once-daily at [facility] all days of week. Patient verbalized understanding of these instructions.     Patient advised to contact clinic or seek medical attention if signs/symptoms of bleeding or thromboembolism occur.  Patient verbalized understanding by repeating back information and was advised to contact me if further medication-related questions arise. Patient was also provided an information handout.  Follow-up Return in 4 weeks (on 10/30/2018) for Follow up INR at 1615h.  Pennie Banter, PharmD, CPP  15 minutes spent face-to-face with the patient during the encounter. 50% of time spent on education, including signs/sx bleeding and clotting, as well as food and drug interactions with warfarin. 50% of time was spent on fingerprick POC INR sample collection,processing, results determination, and documentation in [email or web address].net/.

## 2018-10-02 NOTE — Progress Notes (Deleted)
S: Chelsea Mcdaniel is a 85 y.o. female reports to clinical pharmacist appointment for ***. Patient {Desc; did/not:[id number]} bring medication bottles. Patient is accompanied by {family members:[id number]}, who assist at home with medication management.  Allergies  Allergen Reactions  . Diltiazem Other (See Comments)    Symptomatic bradaycardia  . Metoprolol Other (See Comments)    Symptomatic bradycardia    Current Outpatient Medications:  .  ACCU-CHEK SOFTCLIX LANCETS lancets, Used to check blood sugar 3 times daily., Disp: 100 each, Rfl: 6 .  allopurinol (ZYLOPRIM) 100 MG tablet, Take 1 tablet (100 mg total) by mouth daily., Disp: 90 tablet, Rfl: 0 .  amLODipine (NORVASC) 10 MG tablet, Take 1 tablet (10 mg total) by mouth daily., Disp: 90 tablet, Rfl: 0 .  Blood Glucose Monitoring Suppl (ACCU-CHEK AVIVA PLUS) w/Device KIT, Check blood sugar 1 time a day, Disp: 1 kit, Rfl: 0 .  cloNIDine (CATAPRES - DOSED IN MG/24 HR) 0.3 mg/24hr patch, Place 1 patch (0.3 mg total) onto the skin once a week for 10 doses., Disp: 10 patch, Rfl: 0 .  diclofenac sodium (VOLTAREN) 1 % GEL, Apply 2 g topically 4 (four) times daily., Disp: 1 Tube, Rfl: 1 .  dorzolamide-timolol (COSOPT) 22.3-6.8 MG/ML ophthalmic solution, Place 1 drop into both eyes 2 (two) times daily., Disp: , Rfl: 3 .  furosemide (LASIX) 10 MG/ML solution, Take 8 mLs (80 mg total) by mouth daily as needed for edema., Disp: 60 mL, Rfl: 1 .  glucose blood (ACCU-CHEK AVIVA PLUS) test strip, USE 1 TIME DAILY TO CHECK BLOOD SUGAR, Disp: 100 each, Rfl: 3 .  glucose blood (ACCU-CHEK AVIVA) test strip, Use as instructed, Disp: 100 each, Rfl: 12 .  JANUVIA 25 MG tablet, TAKE 1 TABLET BY MOUTH EVERY DAY (Patient taking differently: Take 25 mg by mouth daily. ), Disp: 90 tablet, Rfl: 2 .  Lancets Misc. (ACCU-CHEK SOFTCLIX LANCET DEV) KIT, Use to check blood sugar one time a day, Disp: 1 kit, Rfl: 2 .  lidocaine (LIDODERM) 5 %, Place 1 patch onto the skin daily.  Remove & Discard patch within 12 hours or as directed by MD, Disp: 30 patch, Rfl: 0 .  LUMIGAN 0.01 % SOLN, Place 1 drop into both eyes at bedtime. , Disp: , Rfl:  .  polyethylene glycol (MIRALAX) packet, Take 17 g by mouth 2 (two) times daily., Disp: 180 packet, Rfl: 1 .  sertraline (ZOLOFT) 25 MG tablet, Take 1 tablet (25 mg total) by mouth daily., Disp: 30 tablet, Rfl: 3 .  warfarin (COUMADIN) 5 MG tablet, TAKE 1 TABLET BY MOUTH EVERY DAY AT 6 PM EXCEPT WEDNESDAY AND FRIDAY TAKE 1 & 1/2 TABLETS ON THOSE DAYS (Patient taking differently: Take 5-7.5 mg by mouth See admin instructions. TAKE 1 TABLET BY MOUTH EVERY DAY AT 6 PM EXCEPT WEDNESDAY AND FRIDAY TAKE 1 & 1/2 TABLETS ON THOSE DAYS), Disp: 32 tablet, Rfl: 1 Past Medical History:  Diagnosis Date  . Anemia     normocytic anemia with baseline hemoglobin 10-11  . Blindness of left eye     likely related to stroke, left cataract removed from that eye with complications  . Calculus gallbladder and bile duct with cholecystitis with obstruction 01/2015  . Chronic kidney disease 2006    left renal artery stenosis with probable hemodynamic significance, kidneys are normal in morphology without focal lesions or hydronephrosis this is based but cannot A. of the abdomen with and without contrast done on the 31st 2006  .  CKD (chronic kidney disease), stage III ([place])   . CVA (cerebral vascular accident) ([place]) 1990's  . CVA (cerebrovascular accident) [facility])  October 2007    CT of the head atrophy with multiple remote insults noted but no definite acute findings  . Glaucoma   . Gout   . Hyperlipidemia   . Hypertension   . Personal history of DVT (deep vein thrombosis) 08/11/2010   BLE  . Type II diabetes mellitus ([place])    Social History   Socioeconomic History  . Marital status: Widowed    Spouse name: Not on file  . Number of children: Not on file  . Years of education: Not on file  . Highest education level: Not on file  Occupational History   . Not on file  Social Needs  . Financial resource strain: Not on file  . Food insecurity:    Worry: Not on file    Inability: Not on file  . Transportation needs:    Medical: Not on file    Non-medical: Not on file  Tobacco Use  . Smoking status: Never Smoker  . Smokeless tobacco: Never Used  Substance and Sexual Activity  . Alcohol use: No    Alcohol/week: 0.0 standard drinks  . Drug use: No  . Sexual activity: Not Currently  Lifestyle  . Physical activity:    Days per week: Not on file    Minutes per session: Not on file  . Stress: Not on file  Relationships  . Social connections:    Talks on phone: Not on file    Gets together: Not on file    Attends religious service: Not on file    Active member of club or organization: Not on file    Attends meetings of clubs or organizations: Not on file    Relationship status: Not on file  Other Topics Concern  . Not on file  Social History Narrative    Patient is a widow. She has 11 children 5 of who are living. She is retired in 1993 from [employer]. She denies tobacco alcohol or drug use.   Family History  Problem Relation Age of Onset  . Heart disease Mother   . Diabetes Mother   . Hyperlipidemia Mother   . Hypertension Mother   . Heart disease Father   . Diabetes Father   . Hyperlipidemia Father   . Hypertension Father     O:    Component Value Date/Time   CHOL 122 08/22/2014 1517   HDL 40 08/22/2014 1517   LDLCALC 64 08/22/2014 1517   TRIG 89 08/22/2014 1517   GLUCOSE 168 (H) 08/26/2018 1303   HGBA1C 6.7 (H) 07/11/2018 0223   NA 139 08/26/2018 1303   NA 142 07/21/2018 1154   K 5.2 (H) 08/26/2018 1303   CL 109 08/26/2018 1303   CO2 21 (L) 08/26/2018 1303   BUN 29 (H) 08/26/2018 1303   BUN 46 (H) 07/21/2018 1154   CREATININE 1.66 (H) 08/26/2018 1303   CREATININE 1.91 (H) 02/03/2015 1658   CALCIUM 8.7 (L) 08/26/2018 1303   CALCIUM 8.8 10/14/2014 1553   GFRNONAA 27 (L) 08/26/2018 1303   GFRNONAA 24  (L) 02/03/2015 1658   GFRAA 32 (L) 08/26/2018 1303   GFRAA 27 (L) 02/03/2015 1658   AST 27 02/05/2016 1052   ALT 14 02/05/2016 1052   WBC 4.5 08/26/2018 1303   HGB 8.2 (L) 08/26/2018 1303   HGB 10.6 (L) 10/06/2015 1553   HCT 27.1 (  L) 08/26/2018 1303   HCT 33.9 (L) 10/06/2015 1553   PLT 261 08/26/2018 1303   PLT 323 10/06/2015 1553   TSH 3.120 07/21/2018 1154   Ht Readings from Last 2 Encounters:  07/21/18 '5\' 3"'$  (1.6 m)  07/11/18 '5\' 4"'$  (1.626 m)   Wt Readings from Last 2 Encounters:  09/08/18 124 lb 6.4 oz (56.4 kg)  08/26/18 134 lb 4.8 oz (60.9 kg)   There is no height or weight on file to calculate BMI. BP Readings from Last 3 Encounters:  09/08/18 (!) 213/77  08/26/18 (!) 148/94  08/17/18 (!) 202/67     A/P: A drug regimen assessment was performed, including review of allergies, interactions, disease-state management, dosing and immunization history. Medications were reviewed with the patient, including name, instructions, indication, goals of therapy, potential side effects, importance of adherence, and safe use.  Findings/Recommendations: ***  Allergies, interactions, polypharmacy  Adherence, education  HTN/DM/Lipids/ASCVD risk or hx  ASA, statin, BB, ACE/ARB review  Bone health: DEXA, calcium/vitamin D  Pain  Thyroid  Smoking cessation  Immunizations  Renal/hepatic dosing  Lab/kinetic monitoring  Drug cost  An after visit summary was provided and patient advised to follow up in {TIME; DAYS WEEKS MONTHS:18704} or sooner if any changes in condition or questions regarding medications arise.   The {Patient/Family/Caregiver:109081} verbalized understanding of information provided by repeating back concepts discussed.   *** minutes spent face-to-face with the patient during the encounter. ***% of time spent on education. ***% of time was spent on ***.

## 2018-10-02 NOTE — Patient Instructions (Signed)
Patient instructed to take medications as defined in the Anti-coagulation Track section of this encounter.  Patient instructed to take today's dose.  Patient instructed to take  1 tablet by mouth once-daily at [facility] all days of week. Patient verbalized understanding of these instructions.

## 2018-10-03 ENCOUNTER — Other Ambulatory Visit: Payer: Self-pay

## 2018-10-03 ENCOUNTER — Emergency Department (HOSPITAL_COMMUNITY)
Admission: EM | Admit: 2018-10-03 | Discharge: 2018-10-03 | Disposition: A | Discharge status: Home / Self Care | Payer: Medicare Other | Attending: Emergency Medicine | Admitting: Emergency Medicine

## 2018-10-03 ENCOUNTER — Encounter (HOSPITAL_COMMUNITY): Payer: Self-pay

## 2018-10-03 DIAGNOSIS — I10 Essential (primary) hypertension: Secondary | ICD-10-CM | POA: Diagnosis not present

## 2018-10-03 DIAGNOSIS — M6281 Muscle weakness (generalized): Secondary | ICD-10-CM | POA: Diagnosis not present

## 2018-10-03 DIAGNOSIS — Z79899 Other long term (current) drug therapy: Secondary | ICD-10-CM | POA: Diagnosis not present

## 2018-10-03 DIAGNOSIS — N184 Chronic kidney disease, stage 4 (severe): Secondary | ICD-10-CM | POA: Diagnosis not present

## 2018-10-03 DIAGNOSIS — Z7901 Long term (current) use of anticoagulants: Secondary | ICD-10-CM | POA: Diagnosis not present

## 2018-10-03 DIAGNOSIS — I447 Left bundle-branch block, unspecified: Secondary | ICD-10-CM | POA: Diagnosis not present

## 2018-10-03 DIAGNOSIS — I440 Atrioventricular block, first degree: Secondary | ICD-10-CM | POA: Diagnosis not present

## 2018-10-03 DIAGNOSIS — Z7984 Long term (current) use of oral hypoglycemic drugs: Secondary | ICD-10-CM | POA: Diagnosis not present

## 2018-10-03 DIAGNOSIS — I499 Cardiac arrhythmia, unspecified: Secondary | ICD-10-CM | POA: Diagnosis not present

## 2018-10-03 DIAGNOSIS — I69398 Other sequelae of cerebral infarction: Secondary | ICD-10-CM | POA: Diagnosis not present

## 2018-10-03 DIAGNOSIS — E119 Type 2 diabetes mellitus without complications: Secondary | ICD-10-CM | POA: Insufficient documentation

## 2018-10-03 DIAGNOSIS — M109 Gout, unspecified: Secondary | ICD-10-CM | POA: Diagnosis not present

## 2018-10-03 DIAGNOSIS — H547 Unspecified visual loss: Secondary | ICD-10-CM | POA: Diagnosis not present

## 2018-10-03 DIAGNOSIS — E1122 Type 2 diabetes mellitus with diabetic chronic kidney disease: Secondary | ICD-10-CM | POA: Diagnosis not present

## 2018-10-03 DIAGNOSIS — I129 Hypertensive chronic kidney disease with stage 1 through stage 4 chronic kidney disease, or unspecified chronic kidney disease: Secondary | ICD-10-CM | POA: Diagnosis not present

## 2018-10-03 DIAGNOSIS — D631 Anemia in chronic kidney disease: Secondary | ICD-10-CM | POA: Diagnosis not present

## 2018-10-03 DIAGNOSIS — Z9181 History of falling: Secondary | ICD-10-CM | POA: Diagnosis not present

## 2018-10-03 DIAGNOSIS — M1611 Unilateral primary osteoarthritis, right hip: Secondary | ICD-10-CM | POA: Diagnosis not present

## 2018-10-03 DIAGNOSIS — Z86718 Personal history of other venous thrombosis and embolism: Secondary | ICD-10-CM | POA: Diagnosis not present

## 2018-10-03 MED ORDER — HYDRALAZINE HCL 20 MG/ML IJ SOLN
10.0000 mg | Freq: Once | INTRAMUSCULAR | Status: AC
  Administered 2018-10-03: 10 mg via INTRAVENOUS
  Filled 2018-10-03: qty 1

## 2018-10-03 MED ORDER — AMLODIPINE BESYLATE 5 MG PO TABS
10.0000 mg | ORAL_TABLET | Freq: Every day | ORAL | Status: DC
  Administered 2018-10-03: 10 mg via ORAL
  Filled 2018-10-03: qty 2

## 2018-10-03 NOTE — ED Provider Notes (Signed)
[facility] EMERGENCY DEPARTMENT Provider Note   CSN: [id number] Arrival date & time: 10/03/18  1245    History   Chief Complaint Chief Complaint  Patient presents with  . Hypertension  . Hyperglycemia    HPI Chelsea Mcdaniel is a 85 y.o. female.  HPI   82-year-old female with hypertension. Advised to come to ER by home health caretaker after they took BP and was 220/100. Pt herself has no acute complaints.  Denies any acute pain, dyspnea or swelling. She doesn't like taking all the medications she is on. Is intermittently compliant. At her baseliner per family member at bedside.    Past Medical History:  Diagnosis Date  . Anemia     normocytic anemia with baseline hemoglobin 10-11  . Blindness of left eye     likely related to stroke, left cataract removed from that eye with complications  . Calculus gallbladder and bile duct with cholecystitis with obstruction 01/2015  . Chronic kidney disease 2006    left renal artery stenosis with probable hemodynamic significance, kidneys are normal in morphology without focal lesions or hydronephrosis this is based but cannot A. of the abdomen with and without contrast done on the 31st 2006  . CKD (chronic kidney disease), stage III (H[facility])   . CVA (cerebral vascular accident) (HCC) 1990's  . CVA (cerebrovascular accident) ([facility])  October 2007    CT of the head atrophy with multiple remote insults noted but no definite acute findings  . Glaucoma   . Gout   . Hyperlipidemia   . Hypertension   . Personal history of DVT (deep vein thrombosis) 08/11/2010   BLE  . Type II diabetes mellitus (HCC)     Patient Active Problem List   Diagnosis Date Noted  . Protein-calorie malnutrition, severe 07/14/2018  . Dementia (HCC) 07/12/2018  . Fall at home 07/11/2018  . Malaise and fatigue 05/02/2018  . Weight loss 02/28/2018  . Ankle edema, bilateral 04/18/2017  . Senile purpura (HCC) 12/18/2016  . Immunization not carried out  because of parent refusal 12/18/2016  . Advance care planning 02/18/2016  . Fatigue 10/20/2015  . Unequal blood pressure in upper extremities 10/20/2015  . Choledocholithiasis 02/11/2015  . Constipation 01/01/2015  . Cervical radiculopathy 12/10/2014  . Left knee pain 08/24/2014  . Vitamin D insufficiency 08/24/2014  . Carpal tunnel syndrome 01/07/2014  . 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 Healthcare maintenance 08/15/2013  . CKD (chronic kidney disease) stage 4, GFR 15-29 ml/min (HCC) 02/16/2012  . Osteoarthritis of hand 02/16/2012  . Long-term (current) use of anticoagulants 08/11/2010  . History of DVT (deep vein thrombosis) 08/11/2010  . Type 2 diabetes mellitus (HCC) 12/12/2006  . Hyperlipidemia 12/12/2006  . Anemia of chronic disease 12/12/2006  . Hypertension associated with diabetes (HCC) 12/12/2006  . Late effect of cerebrovascular accident (CVA) 09/19/2006    Past Surgical History:  Procedure Laterality Date  . ABDOMINAL HYSTERECTOMY    . CATARACT EXTRACTION Left   . TRANSTHORACIC ECHOCARDIOGRAM   May 2008    left ventricular systolic function normal EF estimated range of 55-60%, no definite diagnostic evidence of left ventricular regional wall motion abnormalities, Doppler parameters consistent with abnormal left ventricular relaxation, findings suggestive of possible bicuspid aortic valve and aortic valve thickness mildly increase     OB History   No obstetric history on file.      Home Medications    Prior to Admission medications   Medication   Sig Start Date End Date Taking? Authorizing Provider  ACCU-CHEK SOFTCLIX LANCETS lancets Used to check blood sugar 3 times daily. 08/30/18   Chundi, Verne Spurr, MD  allopurinol (ZYLOPRIM) 100 MG tablet Take 1 tablet (100 mg total) by mouth daily. 06/12/18 09/10/18  [name], MD  amLODipine (NORVASC) 10 MG tablet Take 1 tablet (10 mg total) by mouth daily. 09/08/18 12/07/18  [name], MD    Blood Glucose Monitoring Suppl (ACCU-CHEK AVIVA PLUS) w/Device KIT Check blood sugar 1 time a day 03/08/18   Chundi, Verne Spurr, MD  cloNIDine (CATAPRES - DOSED IN MG/24 HR) 0.3 mg/24hr patch Place 1 patch (0.3 mg total) onto the skin once a week for 10 doses. 09/08/18 11/11/18  [name], MD  diclofenac sodium (VOLTAREN) 1 % GEL Apply 2 g topically 4 (four) times daily. 09/08/18 12/07/18  Chundi, Verne Spurr, MD  dorzolamide-timolol (COSOPT) 22.3-6.8 MG/ML ophthalmic solution Place 1 drop into both eyes 2 (two) times daily. 05/16/18   [provider]  furosemide (LASIX) 10 MG/ML solution Take 8 mLs (80 mg total) by mouth daily as needed for edema. 07/14/18   Agyei, Caprice Kluver, MD  glucose blood (ACCU-CHEK AVIVA PLUS) test strip USE 1 TIME DAILY TO CHECK BLOOD SUGAR 09/03/17   Chundi, [name], MD  glucose blood (ACCU-CHEK AVIVA) test strip Use as instructed 05/03/11 09/03/18  Sharda, Delia Chimes, MD  JANUVIA 25 MG tablet TAKE 1 TABLET BY MOUTH EVERY DAY Patient taking differently: Take 25 mg by mouth daily.  06/12/18   [name], MD  Lancets Misc. (ACCU-CHEK SOFTCLIX LANCET DEV) KIT Use to check blood sugar one time a day 09/03/17   Chundi, [name], MD  lidocaine (LIDODERM) 5 % Place 1 patch onto the skin daily. Remove & Discard patch within 12 hours or as directed by MD 08/26/18   Rodell Perna A, PA-C  LUMIGAN 0.01 % SOLN Place 1 drop into both eyes at bedtime.  12/05/13   [provider]  polyethylene glycol (MIRALAX) packet Take 17 g by mouth 2 (two) times daily. 03/25/17   [name], MD  sertraline (ZOLOFT) 25 MG tablet Take 1 tablet (25 mg total) by mouth daily. 07/21/18   Ledell Noss, MD  warfarin (COUMADIN) 5 MG tablet TAKE 1 TABLET BY MOUTH EVERY DAY AT 6 PM EXCEPT WEDNESDAY AND FRIDAY TAKE 1 & 1/2 TABLETS ON THOSE DAYS Patient taking differently: Take 5-7.5 mg by mouth See admin instructions. TAKE 1 TABLET BY MOUTH EVERY DAY AT 6 PM EXCEPT [facility] AND FRIDAY TAKE 1 & 1/2 TABLETS ON  THOSE DAYS 07/10/18   [name], MD    Family History Family History  Problem Relation Age of Onset  . Heart disease Mother   . Diabetes Mother   . Hyperlipidemia Mother   . Hypertension Mother   . Heart disease Father   . Diabetes Father   . Hyperlipidemia Father   . Hypertension Father     Social History Social History   Tobacco Use  . Smoking status: Never Smoker  . Smokeless tobacco: Never Used  Substance Use Topics  . Alcohol use: No    Alcohol/week: 0.0 standard drinks  . Drug use: No     Allergies   Diltiazem and Metoprolol   Review of Systems Review of Systems  All systems reviewed and negative, other than as noted in HPI.  Physical Exam Updated Vital Signs BP (!) 227/106 (BP Location: Right Arm)   Pulse 80   Temp 98.3 F (36.8 C) (  Oral)   Resp 16   SpO2 100%   Physical Exam Vitals signs and nursing note reviewed.  Constitutional:      General: She is not in acute distress.    Appearance: She is well-developed.  HENT:     Head: Normocephalic and atraumatic.  Eyes:     General:        Right eye: No discharge.        Left eye: No discharge.     Conjunctiva/sclera: Conjunctivae normal.  Neck:     Musculoskeletal: Neck supple.  Cardiovascular:     Rate and Rhythm: Normal rate and regular rhythm.     Heart sounds: Normal heart sounds. No murmur. No friction rub. No gallop.   Pulmonary:     Effort: Pulmonary effort is normal. No respiratory distress.     Breath sounds: Normal breath sounds.  Abdominal:     General: There is no distension.     Palpations: Abdomen is soft.     Tenderness: There is no abdominal tenderness.  Musculoskeletal:        General: No tenderness.  Skin:    General: Skin is warm and dry.  Neurological:     Mental Status: She is alert.     Coordination: Coordination normal.  Psychiatric:        Behavior: Behavior normal.        Thought Content: Thought content normal.      ED Treatments / Results   Labs (all labs ordered are listed, but only abnormal results are displayed) Labs Reviewed - No data to display  EKG EKG Interpretation  Date/Time:  Tuesday October 03 2018 13:05:19 EDT Ventricular Rate:  81 PR Interval:    QRS Duration: 102 QT Interval:  400 QTC Calculation: 465 R Axis:   -55 Text Interpretation:  Sinus rhythm Prolonged PR interval Left anterior fascicular block Low voltage, precordial leads Abnormal R-wave progression, early transition LVH with secondary repolarization abnormality Confirmed by Virgel Manifold [phone]) on 10/03/2018 1:39:45 PM   Radiology No results found.  Procedures Procedures (including critical care time)  Medications Ordered in ED Medications - No data to display   Initial Impression / Assessment and Plan / ED Course  I have reviewed the triage vital signs and the nursing notes.  Pertinent labs & imaging results that were available during my care of the patient were reviewed by me and considered in my medical decision making (see chart for details).        89yF with asymptomatic HTN. No need for emergent testing. FU with PCP for med titration.   Final Clinical Impressions(s) / ED Diagnoses   Final diagnoses:  Essential hypertension    ED Discharge Orders    None       Virgel Manifold, MD 10/13/18 1655

## 2018-10-03 NOTE — Discharge Instructions (Addendum)
It is important that you take your medications as prescribed. You should review your medications every time you visit your doctor and discuss the need to continue them or not. There is no emergent need to get your blood pressure down to normal in the emergency room. It will improve gradually when you are consistently taking your medications again.

## 2018-10-03 NOTE — ED Triage Notes (Signed)
Pt from home with ems for c.o hypertension and hyperglycemia. Pt has home health come out a couple times a week and took her vital signs. BP 224/100 and CBG 224 with ems. Pt has no complaints. No neuro deficits noted. Pt a.o, nad noted.

## 2018-10-03 NOTE — ED Notes (Signed)
ED Provider at bedside. 

## 2018-10-04 NOTE — Progress Notes (Signed)
INTERNAL MEDICINE TEACHING ATTENDING ADDENDUM  I agree with pharmacy recommendations as outlined in their note.   Alexander N Raines, MD  

## 2018-10-05 DIAGNOSIS — Z9181 History of falling: Secondary | ICD-10-CM | POA: Diagnosis not present

## 2018-10-05 DIAGNOSIS — M1611 Unilateral primary osteoarthritis, right hip: Secondary | ICD-10-CM | POA: Diagnosis not present

## 2018-10-05 DIAGNOSIS — H547 Unspecified visual loss: Secondary | ICD-10-CM | POA: Diagnosis not present

## 2018-10-05 DIAGNOSIS — I129 Hypertensive chronic kidney disease with stage 1 through stage 4 chronic kidney disease, or unspecified chronic kidney disease: Secondary | ICD-10-CM | POA: Diagnosis not present

## 2018-10-05 DIAGNOSIS — E1122 Type 2 diabetes mellitus with diabetic chronic kidney disease: Secondary | ICD-10-CM | POA: Diagnosis not present

## 2018-10-05 DIAGNOSIS — M109 Gout, unspecified: Secondary | ICD-10-CM | POA: Diagnosis not present

## 2018-10-05 DIAGNOSIS — M6281 Muscle weakness (generalized): Secondary | ICD-10-CM | POA: Diagnosis not present

## 2018-10-05 DIAGNOSIS — N184 Chronic kidney disease, stage 4 (severe): Secondary | ICD-10-CM | POA: Diagnosis not present

## 2018-10-05 DIAGNOSIS — I69398 Other sequelae of cerebral infarction: Secondary | ICD-10-CM | POA: Diagnosis not present

## 2018-10-05 DIAGNOSIS — Z7984 Long term (current) use of oral hypoglycemic drugs: Secondary | ICD-10-CM | POA: Diagnosis not present

## 2018-10-05 DIAGNOSIS — Z7901 Long term (current) use of anticoagulants: Secondary | ICD-10-CM | POA: Diagnosis not present

## 2018-10-05 DIAGNOSIS — D631 Anemia in chronic kidney disease: Secondary | ICD-10-CM | POA: Diagnosis not present

## 2018-10-05 DIAGNOSIS — I447 Left bundle-branch block, unspecified: Secondary | ICD-10-CM | POA: Diagnosis not present

## 2018-10-05 DIAGNOSIS — Z86718 Personal history of other venous thrombosis and embolism: Secondary | ICD-10-CM | POA: Diagnosis not present

## 2018-10-06 DIAGNOSIS — Z9181 History of falling: Secondary | ICD-10-CM | POA: Diagnosis not present

## 2018-10-06 DIAGNOSIS — I69398 Other sequelae of cerebral infarction: Secondary | ICD-10-CM | POA: Diagnosis not present

## 2018-10-06 DIAGNOSIS — I447 Left bundle-branch block, unspecified: Secondary | ICD-10-CM | POA: Diagnosis not present

## 2018-10-06 DIAGNOSIS — I129 Hypertensive chronic kidney disease with stage 1 through stage 4 chronic kidney disease, or unspecified chronic kidney disease: Secondary | ICD-10-CM | POA: Diagnosis not present

## 2018-10-06 DIAGNOSIS — Z7984 Long term (current) use of oral hypoglycemic drugs: Secondary | ICD-10-CM | POA: Diagnosis not present

## 2018-10-06 DIAGNOSIS — D631 Anemia in chronic kidney disease: Secondary | ICD-10-CM | POA: Diagnosis not present

## 2018-10-06 DIAGNOSIS — M1611 Unilateral primary osteoarthritis, right hip: Secondary | ICD-10-CM | POA: Diagnosis not present

## 2018-10-06 DIAGNOSIS — Z7901 Long term (current) use of anticoagulants: Secondary | ICD-10-CM | POA: Diagnosis not present

## 2018-10-06 DIAGNOSIS — Z86718 Personal history of other venous thrombosis and embolism: Secondary | ICD-10-CM | POA: Diagnosis not present

## 2018-10-06 DIAGNOSIS — H547 Unspecified visual loss: Secondary | ICD-10-CM | POA: Diagnosis not present

## 2018-10-06 DIAGNOSIS — E1122 Type 2 diabetes mellitus with diabetic chronic kidney disease: Secondary | ICD-10-CM | POA: Diagnosis not present

## 2018-10-06 DIAGNOSIS — N184 Chronic kidney disease, stage 4 (severe): Secondary | ICD-10-CM | POA: Diagnosis not present

## 2018-10-06 DIAGNOSIS — M109 Gout, unspecified: Secondary | ICD-10-CM | POA: Diagnosis not present

## 2018-10-06 DIAGNOSIS — M6281 Muscle weakness (generalized): Secondary | ICD-10-CM | POA: Diagnosis not present

## 2018-10-09 DIAGNOSIS — N184 Chronic kidney disease, stage 4 (severe): Secondary | ICD-10-CM | POA: Diagnosis not present

## 2018-10-10 NOTE — Progress Notes (Signed)
-  ADDENDUM   ERRONEOUS ENCOUNTER - DISREGARD

## 2018-10-11 ENCOUNTER — Other Ambulatory Visit: Payer: Self-pay | Admitting: Pharmacist

## 2018-10-11 ENCOUNTER — Encounter: Payer: Self-pay | Admitting: Pharmacist

## 2018-10-11 DIAGNOSIS — Z7901 Long term (current) use of anticoagulants: Secondary | ICD-10-CM

## 2018-10-11 DIAGNOSIS — Z86718 Personal history of other venous thrombosis and embolism: Secondary | ICD-10-CM

## 2018-10-12 DIAGNOSIS — Z7901 Long term (current) use of anticoagulants: Secondary | ICD-10-CM | POA: Diagnosis not present

## 2018-10-12 DIAGNOSIS — I447 Left bundle-branch block, unspecified: Secondary | ICD-10-CM | POA: Diagnosis not present

## 2018-10-12 DIAGNOSIS — E1122 Type 2 diabetes mellitus with diabetic chronic kidney disease: Secondary | ICD-10-CM | POA: Diagnosis not present

## 2018-10-12 DIAGNOSIS — I129 Hypertensive chronic kidney disease with stage 1 through stage 4 chronic kidney disease, or unspecified chronic kidney disease: Secondary | ICD-10-CM | POA: Diagnosis not present

## 2018-10-12 DIAGNOSIS — M6281 Muscle weakness (generalized): Secondary | ICD-10-CM | POA: Diagnosis not present

## 2018-10-12 DIAGNOSIS — H547 Unspecified visual loss: Secondary | ICD-10-CM | POA: Diagnosis not present

## 2018-10-12 DIAGNOSIS — M1611 Unilateral primary osteoarthritis, right hip: Secondary | ICD-10-CM | POA: Diagnosis not present

## 2018-10-12 DIAGNOSIS — Z9181 History of falling: Secondary | ICD-10-CM | POA: Diagnosis not present

## 2018-10-12 DIAGNOSIS — M109 Gout, unspecified: Secondary | ICD-10-CM | POA: Diagnosis not present

## 2018-10-12 DIAGNOSIS — Z86718 Personal history of other venous thrombosis and embolism: Secondary | ICD-10-CM | POA: Diagnosis not present

## 2018-10-12 DIAGNOSIS — I69398 Other sequelae of cerebral infarction: Secondary | ICD-10-CM | POA: Diagnosis not present

## 2018-10-12 DIAGNOSIS — N184 Chronic kidney disease, stage 4 (severe): Secondary | ICD-10-CM | POA: Diagnosis not present

## 2018-10-12 DIAGNOSIS — Z7984 Long term (current) use of oral hypoglycemic drugs: Secondary | ICD-10-CM | POA: Diagnosis not present

## 2018-10-12 DIAGNOSIS — D631 Anemia in chronic kidney disease: Secondary | ICD-10-CM | POA: Diagnosis not present

## 2018-10-13 ENCOUNTER — Other Ambulatory Visit: Payer: Self-pay | Admitting: *Deleted

## 2018-10-13 DIAGNOSIS — Z7984 Long term (current) use of oral hypoglycemic drugs: Secondary | ICD-10-CM | POA: Diagnosis not present

## 2018-10-13 DIAGNOSIS — Z9181 History of falling: Secondary | ICD-10-CM | POA: Diagnosis not present

## 2018-10-13 DIAGNOSIS — H547 Unspecified visual loss: Secondary | ICD-10-CM | POA: Diagnosis not present

## 2018-10-13 DIAGNOSIS — I69398 Other sequelae of cerebral infarction: Secondary | ICD-10-CM | POA: Diagnosis not present

## 2018-10-13 DIAGNOSIS — Z7901 Long term (current) use of anticoagulants: Secondary | ICD-10-CM | POA: Diagnosis not present

## 2018-10-13 DIAGNOSIS — I129 Hypertensive chronic kidney disease with stage 1 through stage 4 chronic kidney disease, or unspecified chronic kidney disease: Secondary | ICD-10-CM | POA: Diagnosis not present

## 2018-10-13 DIAGNOSIS — M6281 Muscle weakness (generalized): Secondary | ICD-10-CM | POA: Diagnosis not present

## 2018-10-13 DIAGNOSIS — Z86718 Personal history of other venous thrombosis and embolism: Secondary | ICD-10-CM

## 2018-10-13 DIAGNOSIS — N184 Chronic kidney disease, stage 4 (severe): Secondary | ICD-10-CM | POA: Diagnosis not present

## 2018-10-13 DIAGNOSIS — I447 Left bundle-branch block, unspecified: Secondary | ICD-10-CM | POA: Diagnosis not present

## 2018-10-13 DIAGNOSIS — M1611 Unilateral primary osteoarthritis, right hip: Secondary | ICD-10-CM | POA: Diagnosis not present

## 2018-10-13 DIAGNOSIS — D631 Anemia in chronic kidney disease: Secondary | ICD-10-CM | POA: Diagnosis not present

## 2018-10-13 DIAGNOSIS — E1122 Type 2 diabetes mellitus with diabetic chronic kidney disease: Secondary | ICD-10-CM | POA: Diagnosis not present

## 2018-10-13 DIAGNOSIS — M109 Gout, unspecified: Secondary | ICD-10-CM | POA: Diagnosis not present

## 2018-10-16 DIAGNOSIS — H547 Unspecified visual loss: Secondary | ICD-10-CM | POA: Diagnosis not present

## 2018-10-16 DIAGNOSIS — N184 Chronic kidney disease, stage 4 (severe): Secondary | ICD-10-CM | POA: Diagnosis not present

## 2018-10-16 DIAGNOSIS — E1122 Type 2 diabetes mellitus with diabetic chronic kidney disease: Secondary | ICD-10-CM | POA: Diagnosis not present

## 2018-10-16 DIAGNOSIS — I129 Hypertensive chronic kidney disease with stage 1 through stage 4 chronic kidney disease, or unspecified chronic kidney disease: Secondary | ICD-10-CM | POA: Diagnosis not present

## 2018-10-16 DIAGNOSIS — I447 Left bundle-branch block, unspecified: Secondary | ICD-10-CM | POA: Diagnosis not present

## 2018-10-16 DIAGNOSIS — M6281 Muscle weakness (generalized): Secondary | ICD-10-CM | POA: Diagnosis not present

## 2018-10-16 DIAGNOSIS — Z86718 Personal history of other venous thrombosis and embolism: Secondary | ICD-10-CM | POA: Diagnosis not present

## 2018-10-16 DIAGNOSIS — M1611 Unilateral primary osteoarthritis, right hip: Secondary | ICD-10-CM | POA: Diagnosis not present

## 2018-10-16 DIAGNOSIS — Z7984 Long term (current) use of oral hypoglycemic drugs: Secondary | ICD-10-CM | POA: Diagnosis not present

## 2018-10-16 DIAGNOSIS — Z9181 History of falling: Secondary | ICD-10-CM | POA: Diagnosis not present

## 2018-10-16 DIAGNOSIS — M109 Gout, unspecified: Secondary | ICD-10-CM | POA: Diagnosis not present

## 2018-10-16 DIAGNOSIS — Z7901 Long term (current) use of anticoagulants: Secondary | ICD-10-CM | POA: Diagnosis not present

## 2018-10-16 DIAGNOSIS — I69398 Other sequelae of cerebral infarction: Secondary | ICD-10-CM | POA: Diagnosis not present

## 2018-10-16 DIAGNOSIS — D631 Anemia in chronic kidney disease: Secondary | ICD-10-CM | POA: Diagnosis not present

## 2018-10-18 DIAGNOSIS — I69398 Other sequelae of cerebral infarction: Secondary | ICD-10-CM | POA: Diagnosis not present

## 2018-10-18 DIAGNOSIS — I447 Left bundle-branch block, unspecified: Secondary | ICD-10-CM | POA: Diagnosis not present

## 2018-10-18 DIAGNOSIS — N184 Chronic kidney disease, stage 4 (severe): Secondary | ICD-10-CM | POA: Diagnosis not present

## 2018-10-18 DIAGNOSIS — H547 Unspecified visual loss: Secondary | ICD-10-CM | POA: Diagnosis not present

## 2018-10-18 DIAGNOSIS — Z7901 Long term (current) use of anticoagulants: Secondary | ICD-10-CM | POA: Diagnosis not present

## 2018-10-18 DIAGNOSIS — I129 Hypertensive chronic kidney disease with stage 1 through stage 4 chronic kidney disease, or unspecified chronic kidney disease: Secondary | ICD-10-CM | POA: Diagnosis not present

## 2018-10-18 DIAGNOSIS — D631 Anemia in chronic kidney disease: Secondary | ICD-10-CM | POA: Diagnosis not present

## 2018-10-18 DIAGNOSIS — Z86718 Personal history of other venous thrombosis and embolism: Secondary | ICD-10-CM | POA: Diagnosis not present

## 2018-10-18 DIAGNOSIS — Z7984 Long term (current) use of oral hypoglycemic drugs: Secondary | ICD-10-CM | POA: Diagnosis not present

## 2018-10-18 DIAGNOSIS — M1611 Unilateral primary osteoarthritis, right hip: Secondary | ICD-10-CM | POA: Diagnosis not present

## 2018-10-18 DIAGNOSIS — M6281 Muscle weakness (generalized): Secondary | ICD-10-CM | POA: Diagnosis not present

## 2018-10-18 DIAGNOSIS — M109 Gout, unspecified: Secondary | ICD-10-CM | POA: Diagnosis not present

## 2018-10-18 DIAGNOSIS — Z9181 History of falling: Secondary | ICD-10-CM | POA: Diagnosis not present

## 2018-10-18 DIAGNOSIS — E1122 Type 2 diabetes mellitus with diabetic chronic kidney disease: Secondary | ICD-10-CM | POA: Diagnosis not present

## 2018-10-20 DIAGNOSIS — H547 Unspecified visual loss: Secondary | ICD-10-CM | POA: Diagnosis not present

## 2018-10-20 DIAGNOSIS — Z86718 Personal history of other venous thrombosis and embolism: Secondary | ICD-10-CM | POA: Diagnosis not present

## 2018-10-20 DIAGNOSIS — E1122 Type 2 diabetes mellitus with diabetic chronic kidney disease: Secondary | ICD-10-CM | POA: Diagnosis not present

## 2018-10-20 DIAGNOSIS — D631 Anemia in chronic kidney disease: Secondary | ICD-10-CM | POA: Diagnosis not present

## 2018-10-20 DIAGNOSIS — I129 Hypertensive chronic kidney disease with stage 1 through stage 4 chronic kidney disease, or unspecified chronic kidney disease: Secondary | ICD-10-CM | POA: Diagnosis not present

## 2018-10-20 DIAGNOSIS — I69398 Other sequelae of cerebral infarction: Secondary | ICD-10-CM | POA: Diagnosis not present

## 2018-10-20 DIAGNOSIS — Z7984 Long term (current) use of oral hypoglycemic drugs: Secondary | ICD-10-CM | POA: Diagnosis not present

## 2018-10-20 DIAGNOSIS — Z7901 Long term (current) use of anticoagulants: Secondary | ICD-10-CM | POA: Diagnosis not present

## 2018-10-20 DIAGNOSIS — Z9181 History of falling: Secondary | ICD-10-CM | POA: Diagnosis not present

## 2018-10-20 DIAGNOSIS — I447 Left bundle-branch block, unspecified: Secondary | ICD-10-CM | POA: Diagnosis not present

## 2018-10-20 DIAGNOSIS — M1611 Unilateral primary osteoarthritis, right hip: Secondary | ICD-10-CM | POA: Diagnosis not present

## 2018-10-20 DIAGNOSIS — M6281 Muscle weakness (generalized): Secondary | ICD-10-CM | POA: Diagnosis not present

## 2018-10-20 DIAGNOSIS — M109 Gout, unspecified: Secondary | ICD-10-CM | POA: Diagnosis not present

## 2018-10-20 DIAGNOSIS — N184 Chronic kidney disease, stage 4 (severe): Secondary | ICD-10-CM | POA: Diagnosis not present

## 2018-10-24 ENCOUNTER — Other Ambulatory Visit: Payer: Self-pay

## 2018-10-24 ENCOUNTER — Ambulatory Visit: Payer: Medicare Other

## 2018-10-24 ENCOUNTER — Telehealth: Payer: Self-pay | Admitting: Internal Medicine

## 2018-10-24 DIAGNOSIS — H547 Unspecified visual loss: Secondary | ICD-10-CM | POA: Diagnosis not present

## 2018-10-24 DIAGNOSIS — I447 Left bundle-branch block, unspecified: Secondary | ICD-10-CM | POA: Diagnosis not present

## 2018-10-24 DIAGNOSIS — I129 Hypertensive chronic kidney disease with stage 1 through stage 4 chronic kidney disease, or unspecified chronic kidney disease: Secondary | ICD-10-CM | POA: Diagnosis not present

## 2018-10-24 DIAGNOSIS — N184 Chronic kidney disease, stage 4 (severe): Secondary | ICD-10-CM | POA: Diagnosis not present

## 2018-10-24 DIAGNOSIS — M109 Gout, unspecified: Secondary | ICD-10-CM | POA: Diagnosis not present

## 2018-10-24 DIAGNOSIS — M6281 Muscle weakness (generalized): Secondary | ICD-10-CM | POA: Diagnosis not present

## 2018-10-24 DIAGNOSIS — Z7901 Long term (current) use of anticoagulants: Secondary | ICD-10-CM | POA: Diagnosis not present

## 2018-10-24 DIAGNOSIS — M1611 Unilateral primary osteoarthritis, right hip: Secondary | ICD-10-CM | POA: Diagnosis not present

## 2018-10-24 DIAGNOSIS — I69398 Other sequelae of cerebral infarction: Secondary | ICD-10-CM | POA: Diagnosis not present

## 2018-10-24 DIAGNOSIS — E1122 Type 2 diabetes mellitus with diabetic chronic kidney disease: Secondary | ICD-10-CM | POA: Diagnosis not present

## 2018-10-24 DIAGNOSIS — Z86718 Personal history of other venous thrombosis and embolism: Secondary | ICD-10-CM | POA: Diagnosis not present

## 2018-10-24 DIAGNOSIS — Z9181 History of falling: Secondary | ICD-10-CM | POA: Diagnosis not present

## 2018-10-24 DIAGNOSIS — D631 Anemia in chronic kidney disease: Secondary | ICD-10-CM | POA: Diagnosis not present

## 2018-10-24 DIAGNOSIS — Z7984 Long term (current) use of oral hypoglycemic drugs: Secondary | ICD-10-CM | POA: Diagnosis not present

## 2018-10-24 NOTE — Telephone Encounter (Signed)
Received call from [place] with [facility] pt's BP was 182/78 today, pt asymptomatic.  Wanted to inform [facility] of the following issues.  Pt flat out refuses to take any of the her medications, with the exception of the coumadin-although she did not take any on Sun and Mon of this week.  [name] can obtain INR if requested.    Pt has different caregivers around the clock. Some caregivers are "hiding" medicine in pt's food, but RN is unsure if pt is actually taking any of her medications consistently.  Pt has dementia-CMA attempted to contact pt's family to discuss meds, no answer, no message left on recorder.  PCP referenced same/similar issues in last office visit. Will send info to to pcp for review.Despina Hidden Cassady3/31/20202:26 PM

## 2018-10-24 NOTE — Telephone Encounter (Signed)
Please schedule an acc telehealth visit regarding these issues and discuss if she may need different services at this time. Thank you!

## 2018-10-24 NOTE — Telephone Encounter (Signed)
Attempted to call patient at home and on mobile phone with no answer to discuss medication concerns.  Information was routed to nurse for patient to be contacted this week.

## 2018-10-25 ENCOUNTER — Other Ambulatory Visit: Payer: Self-pay

## 2018-10-25 ENCOUNTER — Encounter: Payer: Self-pay | Admitting: Internal Medicine

## 2018-10-25 ENCOUNTER — Ambulatory Visit: Payer: Medicare Other | Admitting: Internal Medicine

## 2018-10-25 DIAGNOSIS — I152 Hypertension secondary to endocrine disorders: Secondary | ICD-10-CM

## 2018-10-25 DIAGNOSIS — E1159 Type 2 diabetes mellitus with other circulatory complications: Secondary | ICD-10-CM

## 2018-10-25 DIAGNOSIS — F0150 Vascular dementia without behavioral disturbance: Secondary | ICD-10-CM

## 2018-10-25 DIAGNOSIS — I10 Essential (primary) hypertension: Secondary | ICD-10-CM

## 2018-10-25 NOTE — Assessment & Plan Note (Addendum)
   A: Dementia: Difficult to get pt to take her medications but this is not new.  No real overall change in mental status per the daughter.  P: Has good social support, continue supportive care  PCP will discuss option of palliative care services at next visit

## 2018-10-25 NOTE — Telephone Encounter (Signed)
Telehealth call was done this morning by Dr Shan Levans.

## 2018-10-25 NOTE — Assessment & Plan Note (Signed)
   A: HTN: discussed with daughter pt has some lability in her blood pressure due to medication non compliance.  They give her medications mixed in with her food however her appetite fluctuates and she will not take the meds of her own accord.    P: blood pressure goal can be lenient, she already has clonidine patch, oral bp meds they are doing the best they can to get her to take them with her food, advised them to continue this

## 2018-10-25 NOTE — Progress Notes (Signed)
.     [place] [facility] Telephone Encounter  Reason for call:   This telephone encounter was created for Ms. Ramon Brant Maready on 10/25/2018 for the following purpose/cc History of Advanced Vascular Dementia, HTN.   Pertinent Data:   History of Advanced Vascular Dementia, HTN  Recent telephone call to pcp with some concerns of medication noncompliance, dementia, HTN.  PCP asked for acc telehealth visit   Assessment / Plan / Recommendations:   A: HTN: discussed with daughter pt has some lability in her blood pressure due to medication non compliance.  They give her medications mixed in with her food however her appetite fluctuates and she will not take the meds of her own accord.    P: blood pressure goal can be lenient, she already has clonidine patch, oral bp meds they are doing the best they can to get her to take them with her food, advised them to continue this  A: Dementia: Difficult to get pt to take her medications but this is not new.  No real overall change in mental status per the daughter.  P: Has good social support, continue supportive care  PCP will discuss option of palliative care services at next visit  As always, pt is advised that if symptoms worsen or new symptoms arise, they should go to an urgent care facility or to to ER for further evaluation.   Consent and Medical Decision Making:   Patient discussed with Dr. Lynnae January  This is a telephone encounter between [facility] and Vickki Muff on 10/25/2018 for medication compliance concerns, HTN. The visit was conducted with the patient located at home and Vickki Muff at [facility]. The patient's identity was confirmed using their DOB and current address. The his/her legal guardian has consented to being evaluated through a telephone encounter and understands the associated risks (an examination cannot be done and the patient may need to come in for an appointment) / benefits (allows the patient to  remain at home, decreasing exposure to coronavirus). I personally spent 10 minutes on medical discussion.

## 2018-10-27 ENCOUNTER — Telehealth: Payer: Self-pay | Admitting: Internal Medicine

## 2018-10-27 DIAGNOSIS — I447 Left bundle-branch block, unspecified: Secondary | ICD-10-CM | POA: Diagnosis not present

## 2018-10-27 DIAGNOSIS — Z9181 History of falling: Secondary | ICD-10-CM | POA: Diagnosis not present

## 2018-10-27 DIAGNOSIS — Z7984 Long term (current) use of oral hypoglycemic drugs: Secondary | ICD-10-CM | POA: Diagnosis not present

## 2018-10-27 DIAGNOSIS — M6281 Muscle weakness (generalized): Secondary | ICD-10-CM | POA: Diagnosis not present

## 2018-10-27 DIAGNOSIS — E1122 Type 2 diabetes mellitus with diabetic chronic kidney disease: Secondary | ICD-10-CM | POA: Diagnosis not present

## 2018-10-27 DIAGNOSIS — Z7901 Long term (current) use of anticoagulants: Secondary | ICD-10-CM | POA: Diagnosis not present

## 2018-10-27 DIAGNOSIS — I129 Hypertensive chronic kidney disease with stage 1 through stage 4 chronic kidney disease, or unspecified chronic kidney disease: Secondary | ICD-10-CM | POA: Diagnosis not present

## 2018-10-27 DIAGNOSIS — M109 Gout, unspecified: Secondary | ICD-10-CM | POA: Diagnosis not present

## 2018-10-27 DIAGNOSIS — N184 Chronic kidney disease, stage 4 (severe): Secondary | ICD-10-CM | POA: Diagnosis not present

## 2018-10-27 DIAGNOSIS — M1611 Unilateral primary osteoarthritis, right hip: Secondary | ICD-10-CM | POA: Diagnosis not present

## 2018-10-27 DIAGNOSIS — H547 Unspecified visual loss: Secondary | ICD-10-CM | POA: Diagnosis not present

## 2018-10-27 DIAGNOSIS — I69398 Other sequelae of cerebral infarction: Secondary | ICD-10-CM | POA: Diagnosis not present

## 2018-10-27 DIAGNOSIS — Z86718 Personal history of other venous thrombosis and embolism: Secondary | ICD-10-CM | POA: Diagnosis not present

## 2018-10-27 DIAGNOSIS — D631 Anemia in chronic kidney disease: Secondary | ICD-10-CM | POA: Diagnosis not present

## 2018-10-27 NOTE — Progress Notes (Signed)
Internal Medicine Clinic Attending  Case discussed with Dr. Winfrey  at the time of the visit.  We reviewed the resident's history and exam and pertinent patient test results.  I agree with the assessment, diagnosis, and plan of care documented in the resident's note.  

## 2018-10-27 NOTE — Telephone Encounter (Signed)
PT should continue working with patient  Family is doing great in trying to get the patient the meds in everyway possible  I appreciate social worker assisting  Thank you!

## 2018-10-27 NOTE — Telephone Encounter (Signed)
[place], [facility] calls and states pt BP 140/90 this am, states PCS aide stated pt has been taking meds as prescribed. As we are going over televisit from this week and to recheck bp after 15-20 mins, HHN comes for her visit. She states pt has missed at least 2 doses of meds this week. She will wait appr 15 to 20 min and recheck bp. PT ask what the md would expect of HH PT. VO given to continue range of motion exercises, safety and teaching of family to do smal  Exercises when they assist pt with amb and safety. Do you agree? Awaiting HHN call back with her assessment. Pt is angry with family at this time and new bp is 200/94. She is not real happy at the moment. Family member will take over meds from [facility] worker. HHN lyndsay [phone]

## 2018-10-27 NOTE — Telephone Encounter (Signed)
[facility] calling a report in transferred call to triage Nurse

## 2018-10-30 ENCOUNTER — Ambulatory Visit: Payer: Self-pay

## 2018-10-30 ENCOUNTER — Telehealth: Payer: Self-pay | Admitting: *Deleted

## 2018-10-30 ENCOUNTER — Other Ambulatory Visit: Payer: Self-pay

## 2018-10-30 DIAGNOSIS — I69398 Other sequelae of cerebral infarction: Secondary | ICD-10-CM | POA: Diagnosis not present

## 2018-10-30 DIAGNOSIS — E1122 Type 2 diabetes mellitus with diabetic chronic kidney disease: Secondary | ICD-10-CM | POA: Diagnosis not present

## 2018-10-30 DIAGNOSIS — Z86718 Personal history of other venous thrombosis and embolism: Secondary | ICD-10-CM | POA: Diagnosis not present

## 2018-10-30 DIAGNOSIS — Z9181 History of falling: Secondary | ICD-10-CM | POA: Diagnosis not present

## 2018-10-30 DIAGNOSIS — M109 Gout, unspecified: Secondary | ICD-10-CM | POA: Diagnosis not present

## 2018-10-30 DIAGNOSIS — Z7984 Long term (current) use of oral hypoglycemic drugs: Secondary | ICD-10-CM | POA: Diagnosis not present

## 2018-10-30 DIAGNOSIS — D631 Anemia in chronic kidney disease: Secondary | ICD-10-CM | POA: Diagnosis not present

## 2018-10-30 DIAGNOSIS — M1611 Unilateral primary osteoarthritis, right hip: Secondary | ICD-10-CM | POA: Diagnosis not present

## 2018-10-30 DIAGNOSIS — Z7901 Long term (current) use of anticoagulants: Secondary | ICD-10-CM | POA: Diagnosis not present

## 2018-10-30 DIAGNOSIS — N184 Chronic kidney disease, stage 4 (severe): Secondary | ICD-10-CM | POA: Diagnosis not present

## 2018-10-30 DIAGNOSIS — I447 Left bundle-branch block, unspecified: Secondary | ICD-10-CM | POA: Diagnosis not present

## 2018-10-30 DIAGNOSIS — H547 Unspecified visual loss: Secondary | ICD-10-CM | POA: Diagnosis not present

## 2018-10-30 DIAGNOSIS — M6281 Muscle weakness (generalized): Secondary | ICD-10-CM | POA: Diagnosis not present

## 2018-10-30 DIAGNOSIS — I129 Hypertensive chronic kidney disease with stage 1 through stage 4 chronic kidney disease, or unspecified chronic kidney disease: Secondary | ICD-10-CM | POA: Diagnosis not present

## 2018-10-30 NOTE — Telephone Encounter (Signed)
Received a call from Georgianna, care taker for patient Chelsea Mcdaniel. 10/31/18/20  aprox 1140a  Georgianna requested the best way to coordinate Terriah's coumadin clinic visit on 10/30/2018 and the [facility] appt 10/31/2018.  I spoke with Dr Groce and he has agreed to meet the patient tomorrow, 10/31/2018 at 1100 in [facility] to collect her POCT INR.  I called Georgianna back and told her that Dr Groce would meet Ms Vasek in [facility] at 11:00.  She expressed understanding and appreciation.   , P[facility] Clinic Lab 10/30/2018  12.04 

## 2018-10-31 ENCOUNTER — Telehealth: Payer: Self-pay | Admitting: Pharmacist

## 2018-10-31 ENCOUNTER — Ambulatory Visit (HOSPITAL_COMMUNITY)
Admission: RE | Admit: 2018-10-31 | Discharge: 2018-10-31 | Disposition: A | Discharge status: Home / Self Care | Payer: Medicare Other | Source: Ambulatory Visit | Attending: Nephrology | Admitting: Nephrology

## 2018-10-31 VITALS — BP 179/77 | HR 73 | Temp 97.6°F | Resp 20

## 2018-10-31 DIAGNOSIS — N184 Chronic kidney disease, stage 4 (severe): Secondary | ICD-10-CM | POA: Diagnosis not present

## 2018-10-31 LAB — POCT HEMOGLOBIN-HEMACUE: Hemoglobin: 9.4 g/dL — ABNORMAL LOW (ref 12.0–15.0)

## 2018-10-31 MED ORDER — EPOETIN ALFA-EPBX 10000 UNIT/ML IJ SOLN
10000.0000 [IU] | INTRAMUSCULAR | Status: DC
  Administered 2018-10-31: 10000 [IU] via SUBCUTANEOUS
  Filled 2018-10-31: qty 1

## 2018-10-31 NOTE — Telephone Encounter (Signed)
Reviewed Thanks DrG 

## 2018-10-31 NOTE — Telephone Encounter (Signed)
Patient was being seen in the [facility] unit for injection therapy. I went to [facility] and RN who was drawing patient's blood permitted me to get a hanging pendent of blood from her syringe for our finger-stick, point of care device. Result = 1.2 on 35mg  warf/wk. Increased to 40mg  warfarin per week. Accompanied patient back from [facility] Unit to outside to discuss with the daughter-in-law who was not permitted to enter hospital due to COVID-19 restrictions. I provided written instructions to the daughter-in-law while accompanying patient to NT Main Entrance from the [facility].

## 2018-10-31 NOTE — Telephone Encounter (Signed)
Noted Thanks DrG 

## 2018-10-31 NOTE — Telephone Encounter (Signed)
Patient was being seen in the [facility] unit for injection therapy. I went to [facility] and RN who was drawing patient's blood permitted me to get a hanging pendent of blood from her syringe for our finger-stick, point of care device. Result = 1.2 on 35mg  warfarin per week. Patient denies missed dose(s)--but her DIL/caregiver states that she sometimes omits doses that are placed in the medicine box for her, though she could not quantify how many such doses may have been omitted. Will increase from 35mg /wk to 40mg /wk warfarin (5qd except Tu/Fri, give 7.5mg  (as 1 and 1/2 x 5mg ). Repeat INR 20-Nov-2018.

## 2018-10-31 NOTE — Telephone Encounter (Signed)
Thank you :)

## 2018-11-01 DIAGNOSIS — Z7901 Long term (current) use of anticoagulants: Secondary | ICD-10-CM | POA: Diagnosis not present

## 2018-11-01 DIAGNOSIS — E1122 Type 2 diabetes mellitus with diabetic chronic kidney disease: Secondary | ICD-10-CM | POA: Diagnosis not present

## 2018-11-01 DIAGNOSIS — I447 Left bundle-branch block, unspecified: Secondary | ICD-10-CM | POA: Diagnosis not present

## 2018-11-01 DIAGNOSIS — H547 Unspecified visual loss: Secondary | ICD-10-CM | POA: Diagnosis not present

## 2018-11-01 DIAGNOSIS — I129 Hypertensive chronic kidney disease with stage 1 through stage 4 chronic kidney disease, or unspecified chronic kidney disease: Secondary | ICD-10-CM | POA: Diagnosis not present

## 2018-11-01 DIAGNOSIS — Z86718 Personal history of other venous thrombosis and embolism: Secondary | ICD-10-CM | POA: Diagnosis not present

## 2018-11-01 DIAGNOSIS — Z9181 History of falling: Secondary | ICD-10-CM | POA: Diagnosis not present

## 2018-11-01 DIAGNOSIS — Z7984 Long term (current) use of oral hypoglycemic drugs: Secondary | ICD-10-CM | POA: Diagnosis not present

## 2018-11-01 DIAGNOSIS — I69398 Other sequelae of cerebral infarction: Secondary | ICD-10-CM | POA: Diagnosis not present

## 2018-11-01 DIAGNOSIS — M6281 Muscle weakness (generalized): Secondary | ICD-10-CM | POA: Diagnosis not present

## 2018-11-01 DIAGNOSIS — D631 Anemia in chronic kidney disease: Secondary | ICD-10-CM | POA: Diagnosis not present

## 2018-11-01 DIAGNOSIS — N184 Chronic kidney disease, stage 4 (severe): Secondary | ICD-10-CM | POA: Diagnosis not present

## 2018-11-01 DIAGNOSIS — M1611 Unilateral primary osteoarthritis, right hip: Secondary | ICD-10-CM | POA: Diagnosis not present

## 2018-11-01 DIAGNOSIS — M109 Gout, unspecified: Secondary | ICD-10-CM | POA: Diagnosis not present

## 2018-11-02 DIAGNOSIS — M109 Gout, unspecified: Secondary | ICD-10-CM | POA: Diagnosis not present

## 2018-11-02 DIAGNOSIS — M1611 Unilateral primary osteoarthritis, right hip: Secondary | ICD-10-CM | POA: Diagnosis not present

## 2018-11-02 DIAGNOSIS — E1122 Type 2 diabetes mellitus with diabetic chronic kidney disease: Secondary | ICD-10-CM | POA: Diagnosis not present

## 2018-11-02 DIAGNOSIS — Z9181 History of falling: Secondary | ICD-10-CM | POA: Diagnosis not present

## 2018-11-02 DIAGNOSIS — I447 Left bundle-branch block, unspecified: Secondary | ICD-10-CM | POA: Diagnosis not present

## 2018-11-02 DIAGNOSIS — Z7901 Long term (current) use of anticoagulants: Secondary | ICD-10-CM | POA: Diagnosis not present

## 2018-11-02 DIAGNOSIS — Z7984 Long term (current) use of oral hypoglycemic drugs: Secondary | ICD-10-CM | POA: Diagnosis not present

## 2018-11-02 DIAGNOSIS — D631 Anemia in chronic kidney disease: Secondary | ICD-10-CM | POA: Diagnosis not present

## 2018-11-02 DIAGNOSIS — I129 Hypertensive chronic kidney disease with stage 1 through stage 4 chronic kidney disease, or unspecified chronic kidney disease: Secondary | ICD-10-CM | POA: Diagnosis not present

## 2018-11-02 DIAGNOSIS — H547 Unspecified visual loss: Secondary | ICD-10-CM | POA: Diagnosis not present

## 2018-11-02 DIAGNOSIS — Z86718 Personal history of other venous thrombosis and embolism: Secondary | ICD-10-CM | POA: Diagnosis not present

## 2018-11-02 DIAGNOSIS — N184 Chronic kidney disease, stage 4 (severe): Secondary | ICD-10-CM | POA: Diagnosis not present

## 2018-11-02 DIAGNOSIS — M6281 Muscle weakness (generalized): Secondary | ICD-10-CM | POA: Diagnosis not present

## 2018-11-02 DIAGNOSIS — I69398 Other sequelae of cerebral infarction: Secondary | ICD-10-CM | POA: Diagnosis not present

## 2018-11-04 ENCOUNTER — Other Ambulatory Visit: Payer: Self-pay | Admitting: Internal Medicine

## 2018-11-04 DIAGNOSIS — I10 Essential (primary) hypertension: Principal | ICD-10-CM

## 2018-11-04 DIAGNOSIS — E1159 Type 2 diabetes mellitus with other circulatory complications: Secondary | ICD-10-CM

## 2018-11-08 DIAGNOSIS — M109 Gout, unspecified: Secondary | ICD-10-CM | POA: Diagnosis not present

## 2018-11-08 DIAGNOSIS — Z86718 Personal history of other venous thrombosis and embolism: Secondary | ICD-10-CM | POA: Diagnosis not present

## 2018-11-08 DIAGNOSIS — Z7984 Long term (current) use of oral hypoglycemic drugs: Secondary | ICD-10-CM | POA: Diagnosis not present

## 2018-11-08 DIAGNOSIS — M1611 Unilateral primary osteoarthritis, right hip: Secondary | ICD-10-CM | POA: Diagnosis not present

## 2018-11-08 DIAGNOSIS — Z9181 History of falling: Secondary | ICD-10-CM | POA: Diagnosis not present

## 2018-11-08 DIAGNOSIS — E1122 Type 2 diabetes mellitus with diabetic chronic kidney disease: Secondary | ICD-10-CM | POA: Diagnosis not present

## 2018-11-08 DIAGNOSIS — I447 Left bundle-branch block, unspecified: Secondary | ICD-10-CM | POA: Diagnosis not present

## 2018-11-08 DIAGNOSIS — I69398 Other sequelae of cerebral infarction: Secondary | ICD-10-CM | POA: Diagnosis not present

## 2018-11-08 DIAGNOSIS — D631 Anemia in chronic kidney disease: Secondary | ICD-10-CM | POA: Diagnosis not present

## 2018-11-08 DIAGNOSIS — M6281 Muscle weakness (generalized): Secondary | ICD-10-CM | POA: Diagnosis not present

## 2018-11-08 DIAGNOSIS — N184 Chronic kidney disease, stage 4 (severe): Secondary | ICD-10-CM | POA: Diagnosis not present

## 2018-11-08 DIAGNOSIS — I129 Hypertensive chronic kidney disease with stage 1 through stage 4 chronic kidney disease, or unspecified chronic kidney disease: Secondary | ICD-10-CM | POA: Diagnosis not present

## 2018-11-08 DIAGNOSIS — H547 Unspecified visual loss: Secondary | ICD-10-CM | POA: Diagnosis not present

## 2018-11-08 DIAGNOSIS — Z7901 Long term (current) use of anticoagulants: Secondary | ICD-10-CM | POA: Diagnosis not present

## 2018-11-13 DIAGNOSIS — I69398 Other sequelae of cerebral infarction: Secondary | ICD-10-CM | POA: Diagnosis not present

## 2018-11-13 DIAGNOSIS — I447 Left bundle-branch block, unspecified: Secondary | ICD-10-CM | POA: Diagnosis not present

## 2018-11-13 DIAGNOSIS — D631 Anemia in chronic kidney disease: Secondary | ICD-10-CM | POA: Diagnosis not present

## 2018-11-13 DIAGNOSIS — N184 Chronic kidney disease, stage 4 (severe): Secondary | ICD-10-CM | POA: Diagnosis not present

## 2018-11-13 DIAGNOSIS — E1122 Type 2 diabetes mellitus with diabetic chronic kidney disease: Secondary | ICD-10-CM | POA: Diagnosis not present

## 2018-11-13 DIAGNOSIS — Z7984 Long term (current) use of oral hypoglycemic drugs: Secondary | ICD-10-CM | POA: Diagnosis not present

## 2018-11-13 DIAGNOSIS — Z9181 History of falling: Secondary | ICD-10-CM | POA: Diagnosis not present

## 2018-11-13 DIAGNOSIS — Z7901 Long term (current) use of anticoagulants: Secondary | ICD-10-CM | POA: Diagnosis not present

## 2018-11-13 DIAGNOSIS — H547 Unspecified visual loss: Secondary | ICD-10-CM | POA: Diagnosis not present

## 2018-11-13 DIAGNOSIS — M1611 Unilateral primary osteoarthritis, right hip: Secondary | ICD-10-CM | POA: Diagnosis not present

## 2018-11-13 DIAGNOSIS — Z86718 Personal history of other venous thrombosis and embolism: Secondary | ICD-10-CM | POA: Diagnosis not present

## 2018-11-13 DIAGNOSIS — M6281 Muscle weakness (generalized): Secondary | ICD-10-CM | POA: Diagnosis not present

## 2018-11-13 DIAGNOSIS — I129 Hypertensive chronic kidney disease with stage 1 through stage 4 chronic kidney disease, or unspecified chronic kidney disease: Secondary | ICD-10-CM | POA: Diagnosis not present

## 2018-11-13 DIAGNOSIS — M109 Gout, unspecified: Secondary | ICD-10-CM | POA: Diagnosis not present

## 2018-11-14 ENCOUNTER — Encounter (HOSPITAL_COMMUNITY)
Admission: RE | Admit: 2018-11-14 | Discharge: 2018-11-14 | Disposition: A | Discharge status: Home / Self Care | Payer: Medicare Other | Source: Ambulatory Visit | Attending: Nephrology | Admitting: Nephrology

## 2018-11-14 ENCOUNTER — Other Ambulatory Visit: Payer: Self-pay

## 2018-11-14 ENCOUNTER — Ambulatory Visit (INDEPENDENT_AMBULATORY_CARE_PROVIDER_SITE_OTHER): Payer: Medicare Other | Admitting: Pharmacist

## 2018-11-14 VITALS — BP 176/71 | HR 73 | Temp 98.0°F | Resp 20

## 2018-11-14 DIAGNOSIS — N184 Chronic kidney disease, stage 4 (severe): Secondary | ICD-10-CM | POA: Insufficient documentation

## 2018-11-14 DIAGNOSIS — Z7901 Long term (current) use of anticoagulants: Secondary | ICD-10-CM | POA: Diagnosis not present

## 2018-11-14 DIAGNOSIS — Z5181 Encounter for therapeutic drug level monitoring: Secondary | ICD-10-CM | POA: Diagnosis not present

## 2018-11-14 DIAGNOSIS — Z86718 Personal history of other venous thrombosis and embolism: Secondary | ICD-10-CM | POA: Diagnosis not present

## 2018-11-14 LAB — POCT INR: INR: 1.9 — AB (ref 2.0–3.0)

## 2018-11-14 LAB — POCT HEMOGLOBIN-HEMACUE: Hemoglobin: 10 g/dL — ABNORMAL LOW (ref 12.0–15.0)

## 2018-11-14 MED ORDER — EPOETIN ALFA-EPBX 10000 UNIT/ML IJ SOLN
10000.0000 [IU] | INTRAMUSCULAR | Status: DC
  Administered 2018-11-14: 10000 [IU] via SUBCUTANEOUS
  Filled 2018-11-14: qty 1

## 2018-11-14 NOTE — Patient Instructions (Signed)
Patient instructed to take medications as defined in the Anti-coagulation Track section of this encounter.  Patient instructed to take today's dose.  Patient instructed to take one-and-one-half (1 and 1/2) of your 5mg  peach-colored warfarin tablets on Tuesdays, Thursdays, Saturdays and Sundays. All other days (Mondays, Wednesdays and Fridays--take only one (1) of your 5mg  peach-colored warfarin tablets. Repeat this sequence each week.  Patient verbalized understanding of these instructions.

## 2018-11-14 NOTE — Progress Notes (Signed)
Reviewed Thanks DrG 

## 2018-11-14 NOTE — Progress Notes (Signed)
Anticoagulation Management Chelsea Mcdaniel is a 85 y.o. female who reports to the clinic for monitoring of warfarin treatment.    Indication: DVT .  Duration: indefinite Supervising physician: Murriel Hopper  Anticoagulation Clinic Visit History: Patient does not report signs/symptoms of bleeding or thromboembolism  Other recent changes: No diet, medications, lifestyle changes.  Anticoagulation Episode Summary    Current INR goal:   2.0-3.0  TTR:   72.8 % (4.8 y)  Next INR check:   12/05/2018  INR from last check:   1.9! (11/14/2018)  Weekly max warfarin dose:     Target end date:     INR check location:     Preferred lab:     Send INR reminders to:      Indications   EMBOLISM/THROMBOSIS DEEP VSL LWR EXTRM NOS (Resolved) [I82.409] Long-term (current) use of anticoagulants [Z79.01]       Comments:           Allergies  Allergen Reactions  . Diltiazem Other (See Comments)    Symptomatic bradaycardia  . Metoprolol Other (See Comments)    Symptomatic bradycardia   Prior to Admission medications   Medication Sig Start Date End Date Taking? Authorizing Provider  ACCU-CHEK SOFTCLIX LANCETS lancets Used to check blood sugar 3 times daily. 08/30/18  Yes Chundi, [name], MD  amLODipine (NORVASC) 10 MG tablet Take 1 tablet (10 mg total) by mouth daily. 09/08/18 12/07/18 Yes Chundi, [name], MD  Blood Glucose Monitoring Suppl (ACCU-CHEK AVIVA PLUS) w/Device KIT Check blood sugar 1 time a day 03/08/18  Yes Chundi, [name], MD  cloNIDine (CATAPRES - DOSED IN MG/24 HR) 0.3 mg/24hr patch Place 1 patch (0.3 mg total) onto the skin once a week for 10 doses. 11/06/18 01/09/19 Yes [name], MD  diclofenac sodium (VOLTAREN) 1 % GEL Apply 2 g topically 4 (four) times daily. 09/08/18 12/07/18 Yes Chundi, [name], MD  dorzolamide-timolol (COSOPT) 22.3-6.8 MG/ML ophthalmic solution Place 1 drop into both eyes 2 (two) times daily. 05/16/18  Yes [provider]  furosemide (LASIX) 10  MG/ML solution Take 8 mLs (80 mg total) by mouth daily as needed for edema. 07/14/18  Yes Agyei, Caprice Kluver, MD  furosemide (LASIX) 40 MG tablet TAKE 1 TABLET BY MOUTH EVERY DAY 11/06/18  Yes [name], MD  glucose blood (ACCU-CHEK AVIVA PLUS) test strip USE 1 TIME DAILY TO CHECK BLOOD SUGAR 09/03/17  Yes Chundi, [name], MD  JANUVIA 25 MG tablet TAKE 1 TABLET BY MOUTH EVERY DAY Patient taking differently: Take 25 mg by mouth daily.  06/12/18  Yes Chundi, Verne Spurr, MD  Lancets Misc. (ACCU-CHEK SOFTCLIX LANCET DEV) KIT Use to check blood sugar one time a day 09/03/17  Yes Chundi, [name], MD  lidocaine (LIDODERM) 5 % Place 1 patch onto the skin daily. Remove & Discard patch within 12 hours or as directed by MD 08/26/18  Yes Nils Flack, Mina A, PA-C  LUMIGAN 0.01 % SOLN Place 1 drop into both eyes at bedtime.  12/05/13  Yes [provider]  polyethylene glycol (MIRALAX) packet Take 17 g by mouth 2 (two) times daily. 03/25/17  Yes [name], MD  sertraline (ZOLOFT) 25 MG tablet Take 1 tablet (25 mg total) by mouth daily. 07/21/18  Yes Ledell Noss, MD  warfarin (COUMADIN) 5 MG tablet TAKE 1 TABLET BY MOUTH EVERY DAY AT 6 PM EXCEPT WEDNESDAY AND FRIDAY TAKE 1 & 1/2 TABLETS ON THOSE DAYS Patient taking differently: Take 5-7.5 mg by mouth See admin instructions.  TAKE 1 TABLET BY MOUTH EVERY DAY AT 6 PM EXCEPT [facility] AND FRIDAY TAKE 1 & 1/2 TABLETS ON THOSE DAYS 07/10/18  Yes Chundi, [name], MD  allopurinol (ZYLOPRIM) 100 MG tablet Take 1 tablet (100 mg total) by mouth daily. 06/12/18 09/10/18  [name], MD  glucose blood (ACCU-CHEK AVIVA) test strip Use as instructed 05/03/11 09/03/18  Othella Boyer, MD   Past Medical History:  Diagnosis Date  . Anemia     normocytic anemia with baseline hemoglobin 10-11  . Blindness of left eye     likely related to stroke, left cataract removed from that eye with complications  . Calculus gallbladder and bile duct with cholecystitis with obstruction  01/2015  . Chronic kidney disease 2006    left renal artery stenosis with probable hemodynamic significance, kidneys are normal in morphology without focal lesions or hydronephrosis this is based but cannot A. of the abdomen with and without contrast done on the 31st 2006  . CKD (chronic kidney disease), stage III ([place])   . CVA (cerebral vascular accident) ([place]) 1990's  . CVA (cerebrovascular accident) [facility])  October 2007    CT of the head atrophy with multiple remote insults noted but no definite acute findings  . Glaucoma   . Gout   . Hyperlipidemia   . Hypertension   . Personal history of DVT (deep vein thrombosis) 08/11/2010   BLE  . Type II diabetes mellitus ([place])    Social History   Socioeconomic History  . Marital status: Widowed    Spouse name: Not on file  . Number of children: Not on file  . Years of education: Not on file  . Highest education level: Not on file  Occupational History  . Not on file  Social Needs  . Financial resource strain: Not on file  . Food insecurity:    Worry: Not on file    Inability: Not on file  . Transportation needs:    Medical: Not on file    Non-medical: Not on file  Tobacco Use  . Smoking status: Never Smoker  . Smokeless tobacco: Never Used  Substance and Sexual Activity  . Alcohol use: No    Alcohol/week: 0.0 standard drinks  . Drug use: No  . Sexual activity: Not Currently  Lifestyle  . Physical activity:    Days per week: Not on file    Minutes per session: Not on file  . Stress: Not on file  Relationships  . Social connections:    Talks on phone: Not on file    Gets together: Not on file    Attends religious service: Not on file    Active member of club or organization: Not on file    Attends meetings of clubs or organizations: Not on file    Relationship status: Not on file  Other Topics Concern  . Not on file  Social History Narrative    Patient is a widow. She has 11 children 5 of who are living. She is retired in  1993 from [employer]. She denies tobacco alcohol or drug use.   Family History  Problem Relation Age of Onset  . Heart disease Mother   . Diabetes Mother   . Hyperlipidemia Mother   . Hypertension Mother   . Heart disease Father   . Diabetes Father   . Hyperlipidemia Father   . Hypertension Father     ASSESSMENT Recent Results: The most recent result is correlated with 40 mg per week:  Lab Results  Component Value Date   INR 1.9 (A) 11/14/2018   INR 2.0 10/02/2018   INR 2.1 09/04/2018    Anticoagulation Dosing: Description   Had been taking 1 and 1/2 x 39m on 2 days of week, 581mall other days (4057mk), will increase to 30m29m, an 11% increase in total weekly dosage.      INR today: Subtherapeutic  PLAN Weekly dose was increased by 11% to 45 mg per week  Patient Instructions  Patient instructed to take medications as defined in the Anti-coagulation Track section of this encounter.  Patient instructed to take today's dose.  Patient instructed to take one-and-one-half (1 and 1/2) of your 5mg 78mch-colored warfarin tablets on Tuesdays, Thursdays, Saturdays and Sundays. All other days (Mondays, Wednesdays and Fridays--take only one (1) of your 5mg p51mh-colored warfarin tablets. Repeat this sequence each week.  Patient verbalized understanding of these instructions.     Patient advised to contact clinic or seek medical attention if signs/symptoms of bleeding or thromboembolism occur.  Patient verbalized understanding by repeating back information and was advised to contact me if further medication-related questions arise. Patient was also provided an information handout.  Follow-up Return in 3 weeks (on 12/05/2018) for Follow up INR.  Chawn Spraggins Pennie BantermD, CPP  15 minutes spent face-to-face with the patient during the encounter. 50% of time spent on education, including signs/sx bleeding and clotting, as well as food and drug interactions with warfarin. 50% of time  was spent on fingerprick POC INR sample collection,processing, results determination, and documentation in [email or web address]

## 2018-11-15 DIAGNOSIS — M6281 Muscle weakness (generalized): Secondary | ICD-10-CM | POA: Diagnosis not present

## 2018-11-15 DIAGNOSIS — M1611 Unilateral primary osteoarthritis, right hip: Secondary | ICD-10-CM | POA: Diagnosis not present

## 2018-11-15 DIAGNOSIS — Z7984 Long term (current) use of oral hypoglycemic drugs: Secondary | ICD-10-CM | POA: Diagnosis not present

## 2018-11-15 DIAGNOSIS — D631 Anemia in chronic kidney disease: Secondary | ICD-10-CM | POA: Diagnosis not present

## 2018-11-15 DIAGNOSIS — Z9181 History of falling: Secondary | ICD-10-CM | POA: Diagnosis not present

## 2018-11-15 DIAGNOSIS — M109 Gout, unspecified: Secondary | ICD-10-CM | POA: Diagnosis not present

## 2018-11-15 DIAGNOSIS — H547 Unspecified visual loss: Secondary | ICD-10-CM | POA: Diagnosis not present

## 2018-11-15 DIAGNOSIS — Z7901 Long term (current) use of anticoagulants: Secondary | ICD-10-CM | POA: Diagnosis not present

## 2018-11-15 DIAGNOSIS — N184 Chronic kidney disease, stage 4 (severe): Secondary | ICD-10-CM | POA: Diagnosis not present

## 2018-11-15 DIAGNOSIS — I69398 Other sequelae of cerebral infarction: Secondary | ICD-10-CM | POA: Diagnosis not present

## 2018-11-15 DIAGNOSIS — I129 Hypertensive chronic kidney disease with stage 1 through stage 4 chronic kidney disease, or unspecified chronic kidney disease: Secondary | ICD-10-CM | POA: Diagnosis not present

## 2018-11-15 DIAGNOSIS — I447 Left bundle-branch block, unspecified: Secondary | ICD-10-CM | POA: Diagnosis not present

## 2018-11-15 DIAGNOSIS — E1122 Type 2 diabetes mellitus with diabetic chronic kidney disease: Secondary | ICD-10-CM | POA: Diagnosis not present

## 2018-11-15 DIAGNOSIS — Z86718 Personal history of other venous thrombosis and embolism: Secondary | ICD-10-CM | POA: Diagnosis not present

## 2018-11-20 DIAGNOSIS — I129 Hypertensive chronic kidney disease with stage 1 through stage 4 chronic kidney disease, or unspecified chronic kidney disease: Secondary | ICD-10-CM | POA: Diagnosis not present

## 2018-11-20 DIAGNOSIS — Z7901 Long term (current) use of anticoagulants: Secondary | ICD-10-CM | POA: Diagnosis not present

## 2018-11-20 DIAGNOSIS — D631 Anemia in chronic kidney disease: Secondary | ICD-10-CM | POA: Diagnosis not present

## 2018-11-20 DIAGNOSIS — M6281 Muscle weakness (generalized): Secondary | ICD-10-CM | POA: Diagnosis not present

## 2018-11-20 DIAGNOSIS — E1122 Type 2 diabetes mellitus with diabetic chronic kidney disease: Secondary | ICD-10-CM | POA: Diagnosis not present

## 2018-11-20 DIAGNOSIS — M109 Gout, unspecified: Secondary | ICD-10-CM | POA: Diagnosis not present

## 2018-11-20 DIAGNOSIS — Z86718 Personal history of other venous thrombosis and embolism: Secondary | ICD-10-CM | POA: Diagnosis not present

## 2018-11-20 DIAGNOSIS — H547 Unspecified visual loss: Secondary | ICD-10-CM | POA: Diagnosis not present

## 2018-11-20 DIAGNOSIS — Z7984 Long term (current) use of oral hypoglycemic drugs: Secondary | ICD-10-CM | POA: Diagnosis not present

## 2018-11-20 DIAGNOSIS — M1611 Unilateral primary osteoarthritis, right hip: Secondary | ICD-10-CM | POA: Diagnosis not present

## 2018-11-20 DIAGNOSIS — I447 Left bundle-branch block, unspecified: Secondary | ICD-10-CM | POA: Diagnosis not present

## 2018-11-20 DIAGNOSIS — I69398 Other sequelae of cerebral infarction: Secondary | ICD-10-CM | POA: Diagnosis not present

## 2018-11-20 DIAGNOSIS — Z9181 History of falling: Secondary | ICD-10-CM | POA: Diagnosis not present

## 2018-11-20 DIAGNOSIS — N184 Chronic kidney disease, stage 4 (severe): Secondary | ICD-10-CM | POA: Diagnosis not present

## 2018-11-21 DIAGNOSIS — L89322 Pressure ulcer of left buttock, stage 2: Secondary | ICD-10-CM | POA: Diagnosis not present

## 2018-11-23 DIAGNOSIS — Z86718 Personal history of other venous thrombosis and embolism: Secondary | ICD-10-CM | POA: Diagnosis not present

## 2018-11-23 DIAGNOSIS — I69398 Other sequelae of cerebral infarction: Secondary | ICD-10-CM | POA: Diagnosis not present

## 2018-11-23 DIAGNOSIS — M1611 Unilateral primary osteoarthritis, right hip: Secondary | ICD-10-CM | POA: Diagnosis not present

## 2018-11-23 DIAGNOSIS — N184 Chronic kidney disease, stage 4 (severe): Secondary | ICD-10-CM | POA: Diagnosis not present

## 2018-11-23 DIAGNOSIS — Z7901 Long term (current) use of anticoagulants: Secondary | ICD-10-CM | POA: Diagnosis not present

## 2018-11-23 DIAGNOSIS — Z7984 Long term (current) use of oral hypoglycemic drugs: Secondary | ICD-10-CM | POA: Diagnosis not present

## 2018-11-23 DIAGNOSIS — I447 Left bundle-branch block, unspecified: Secondary | ICD-10-CM | POA: Diagnosis not present

## 2018-11-23 DIAGNOSIS — D631 Anemia in chronic kidney disease: Secondary | ICD-10-CM | POA: Diagnosis not present

## 2018-11-23 DIAGNOSIS — H547 Unspecified visual loss: Secondary | ICD-10-CM | POA: Diagnosis not present

## 2018-11-23 DIAGNOSIS — I129 Hypertensive chronic kidney disease with stage 1 through stage 4 chronic kidney disease, or unspecified chronic kidney disease: Secondary | ICD-10-CM | POA: Diagnosis not present

## 2018-11-23 DIAGNOSIS — E1122 Type 2 diabetes mellitus with diabetic chronic kidney disease: Secondary | ICD-10-CM | POA: Diagnosis not present

## 2018-11-23 DIAGNOSIS — M6281 Muscle weakness (generalized): Secondary | ICD-10-CM | POA: Diagnosis not present

## 2018-11-23 DIAGNOSIS — M109 Gout, unspecified: Secondary | ICD-10-CM | POA: Diagnosis not present

## 2018-11-23 DIAGNOSIS — Z9181 History of falling: Secondary | ICD-10-CM | POA: Diagnosis not present

## 2018-11-27 DIAGNOSIS — Z9181 History of falling: Secondary | ICD-10-CM | POA: Diagnosis not present

## 2018-11-27 DIAGNOSIS — L89322 Pressure ulcer of left buttock, stage 2: Secondary | ICD-10-CM | POA: Diagnosis not present

## 2018-11-27 DIAGNOSIS — Z86718 Personal history of other venous thrombosis and embolism: Secondary | ICD-10-CM | POA: Diagnosis not present

## 2018-11-27 DIAGNOSIS — I69398 Other sequelae of cerebral infarction: Secondary | ICD-10-CM | POA: Diagnosis not present

## 2018-11-27 DIAGNOSIS — I447 Left bundle-branch block, unspecified: Secondary | ICD-10-CM | POA: Diagnosis not present

## 2018-11-27 DIAGNOSIS — E1122 Type 2 diabetes mellitus with diabetic chronic kidney disease: Secondary | ICD-10-CM | POA: Diagnosis not present

## 2018-11-27 DIAGNOSIS — M6281 Muscle weakness (generalized): Secondary | ICD-10-CM | POA: Diagnosis not present

## 2018-11-27 DIAGNOSIS — Z7901 Long term (current) use of anticoagulants: Secondary | ICD-10-CM | POA: Diagnosis not present

## 2018-11-27 DIAGNOSIS — Z4800 Encounter for change or removal of nonsurgical wound dressing: Secondary | ICD-10-CM | POA: Diagnosis not present

## 2018-11-27 DIAGNOSIS — Z7984 Long term (current) use of oral hypoglycemic drugs: Secondary | ICD-10-CM | POA: Diagnosis not present

## 2018-11-27 DIAGNOSIS — D631 Anemia in chronic kidney disease: Secondary | ICD-10-CM | POA: Diagnosis not present

## 2018-11-27 DIAGNOSIS — M1611 Unilateral primary osteoarthritis, right hip: Secondary | ICD-10-CM | POA: Diagnosis not present

## 2018-11-27 DIAGNOSIS — H547 Unspecified visual loss: Secondary | ICD-10-CM | POA: Diagnosis not present

## 2018-11-27 DIAGNOSIS — N184 Chronic kidney disease, stage 4 (severe): Secondary | ICD-10-CM | POA: Diagnosis not present

## 2018-11-27 DIAGNOSIS — M109 Gout, unspecified: Secondary | ICD-10-CM | POA: Diagnosis not present

## 2018-11-27 DIAGNOSIS — I129 Hypertensive chronic kidney disease with stage 1 through stage 4 chronic kidney disease, or unspecified chronic kidney disease: Secondary | ICD-10-CM | POA: Diagnosis not present

## 2018-11-28 ENCOUNTER — Other Ambulatory Visit: Payer: Self-pay

## 2018-11-28 ENCOUNTER — Ambulatory Visit (HOSPITAL_COMMUNITY)
Admission: RE | Admit: 2018-11-28 | Discharge: 2018-11-28 | Disposition: A | Discharge status: Home / Self Care | Payer: Medicare Other | Source: Ambulatory Visit | Attending: Nephrology | Admitting: Nephrology

## 2018-11-28 VITALS — BP 177/77 | HR 70 | Temp 96.9°F | Resp 20

## 2018-11-28 DIAGNOSIS — N184 Chronic kidney disease, stage 4 (severe): Secondary | ICD-10-CM | POA: Diagnosis not present

## 2018-11-28 LAB — IRON AND TIBC
Iron: 69 ug/dL (ref 28–170)
Saturation Ratios: 33 % — ABNORMAL HIGH (ref 10.4–31.8)
TIBC: 207 ug/dL — ABNORMAL LOW (ref 250–450)
UIBC: 138 ug/dL

## 2018-11-28 LAB — FERRITIN: Ferritin: 188 ng/mL (ref 11–307)

## 2018-11-28 LAB — POCT HEMOGLOBIN-HEMACUE: Hemoglobin: 10.4 g/dL — ABNORMAL LOW (ref 12.0–15.0)

## 2018-11-28 MED ORDER — EPOETIN ALFA-EPBX 10000 UNIT/ML IJ SOLN
10000.0000 [IU] | INTRAMUSCULAR | Status: DC
  Administered 2018-11-28: 10000 [IU] via SUBCUTANEOUS
  Filled 2018-11-28: qty 1

## 2018-11-29 DIAGNOSIS — I447 Left bundle-branch block, unspecified: Secondary | ICD-10-CM | POA: Diagnosis not present

## 2018-11-29 DIAGNOSIS — Z7984 Long term (current) use of oral hypoglycemic drugs: Secondary | ICD-10-CM | POA: Diagnosis not present

## 2018-11-29 DIAGNOSIS — H547 Unspecified visual loss: Secondary | ICD-10-CM | POA: Diagnosis not present

## 2018-11-29 DIAGNOSIS — L89322 Pressure ulcer of left buttock, stage 2: Secondary | ICD-10-CM | POA: Diagnosis not present

## 2018-11-29 DIAGNOSIS — M1611 Unilateral primary osteoarthritis, right hip: Secondary | ICD-10-CM | POA: Diagnosis not present

## 2018-11-29 DIAGNOSIS — D631 Anemia in chronic kidney disease: Secondary | ICD-10-CM | POA: Diagnosis not present

## 2018-11-29 DIAGNOSIS — N184 Chronic kidney disease, stage 4 (severe): Secondary | ICD-10-CM | POA: Diagnosis not present

## 2018-11-29 DIAGNOSIS — M6281 Muscle weakness (generalized): Secondary | ICD-10-CM | POA: Diagnosis not present

## 2018-11-29 DIAGNOSIS — Z9181 History of falling: Secondary | ICD-10-CM | POA: Diagnosis not present

## 2018-11-29 DIAGNOSIS — E1122 Type 2 diabetes mellitus with diabetic chronic kidney disease: Secondary | ICD-10-CM | POA: Diagnosis not present

## 2018-11-29 DIAGNOSIS — I129 Hypertensive chronic kidney disease with stage 1 through stage 4 chronic kidney disease, or unspecified chronic kidney disease: Secondary | ICD-10-CM | POA: Diagnosis not present

## 2018-11-29 DIAGNOSIS — Z86718 Personal history of other venous thrombosis and embolism: Secondary | ICD-10-CM | POA: Diagnosis not present

## 2018-11-29 DIAGNOSIS — Z7901 Long term (current) use of anticoagulants: Secondary | ICD-10-CM | POA: Diagnosis not present

## 2018-11-29 DIAGNOSIS — I69398 Other sequelae of cerebral infarction: Secondary | ICD-10-CM | POA: Diagnosis not present

## 2018-11-29 DIAGNOSIS — Z4800 Encounter for change or removal of nonsurgical wound dressing: Secondary | ICD-10-CM | POA: Diagnosis not present

## 2018-11-29 DIAGNOSIS — M109 Gout, unspecified: Secondary | ICD-10-CM | POA: Diagnosis not present

## 2018-12-01 DIAGNOSIS — Z7901 Long term (current) use of anticoagulants: Secondary | ICD-10-CM | POA: Diagnosis not present

## 2018-12-01 DIAGNOSIS — H547 Unspecified visual loss: Secondary | ICD-10-CM | POA: Diagnosis not present

## 2018-12-01 DIAGNOSIS — M6281 Muscle weakness (generalized): Secondary | ICD-10-CM | POA: Diagnosis not present

## 2018-12-01 DIAGNOSIS — M109 Gout, unspecified: Secondary | ICD-10-CM | POA: Diagnosis not present

## 2018-12-01 DIAGNOSIS — L89322 Pressure ulcer of left buttock, stage 2: Secondary | ICD-10-CM | POA: Diagnosis not present

## 2018-12-01 DIAGNOSIS — I69398 Other sequelae of cerebral infarction: Secondary | ICD-10-CM | POA: Diagnosis not present

## 2018-12-01 DIAGNOSIS — Z86718 Personal history of other venous thrombosis and embolism: Secondary | ICD-10-CM | POA: Diagnosis not present

## 2018-12-01 DIAGNOSIS — N184 Chronic kidney disease, stage 4 (severe): Secondary | ICD-10-CM | POA: Diagnosis not present

## 2018-12-01 DIAGNOSIS — I447 Left bundle-branch block, unspecified: Secondary | ICD-10-CM | POA: Diagnosis not present

## 2018-12-01 DIAGNOSIS — I129 Hypertensive chronic kidney disease with stage 1 through stage 4 chronic kidney disease, or unspecified chronic kidney disease: Secondary | ICD-10-CM | POA: Diagnosis not present

## 2018-12-01 DIAGNOSIS — Z4800 Encounter for change or removal of nonsurgical wound dressing: Secondary | ICD-10-CM | POA: Diagnosis not present

## 2018-12-01 DIAGNOSIS — D631 Anemia in chronic kidney disease: Secondary | ICD-10-CM | POA: Diagnosis not present

## 2018-12-01 DIAGNOSIS — E1122 Type 2 diabetes mellitus with diabetic chronic kidney disease: Secondary | ICD-10-CM | POA: Diagnosis not present

## 2018-12-01 DIAGNOSIS — Z9181 History of falling: Secondary | ICD-10-CM | POA: Diagnosis not present

## 2018-12-01 DIAGNOSIS — M1611 Unilateral primary osteoarthritis, right hip: Secondary | ICD-10-CM | POA: Diagnosis not present

## 2018-12-01 DIAGNOSIS — Z7984 Long term (current) use of oral hypoglycemic drugs: Secondary | ICD-10-CM | POA: Diagnosis not present

## 2018-12-02 ENCOUNTER — Other Ambulatory Visit: Payer: Self-pay | Admitting: Internal Medicine

## 2018-12-02 DIAGNOSIS — I152 Hypertension secondary to endocrine disorders: Secondary | ICD-10-CM

## 2018-12-02 DIAGNOSIS — M1A3710 Chronic gout due to renal impairment, right ankle and foot, without tophus (tophi): Secondary | ICD-10-CM

## 2018-12-02 DIAGNOSIS — E1159 Type 2 diabetes mellitus with other circulatory complications: Secondary | ICD-10-CM

## 2018-12-04 ENCOUNTER — Other Ambulatory Visit: Payer: Self-pay | Admitting: Internal Medicine

## 2018-12-04 DIAGNOSIS — L89322 Pressure ulcer of left buttock, stage 2: Secondary | ICD-10-CM | POA: Diagnosis not present

## 2018-12-04 DIAGNOSIS — M1A3710 Chronic gout due to renal impairment, right ankle and foot, without tophus (tophi): Secondary | ICD-10-CM

## 2018-12-04 NOTE — Telephone Encounter (Signed)
Allopurinol refill also requested in another encounter taking different than requested-please confirm dose prior to filling.  Allopurinol 100mg  take one tablet daily vs Allopurinol 100mg  take one and one-half tab daily

## 2018-12-04 NOTE — Telephone Encounter (Signed)
Allopurinol refill also requested in another encounter taking different than requested-please confirm dose prior to filling  Allopurinol 100mg  take one tablet daily vs Allopurinol 100mg  take one and one-half tab daily .Despina Hidden Cassady5/11/20202:37 PM

## 2018-12-09 DIAGNOSIS — M1611 Unilateral primary osteoarthritis, right hip: Secondary | ICD-10-CM | POA: Diagnosis not present

## 2018-12-09 DIAGNOSIS — I447 Left bundle-branch block, unspecified: Secondary | ICD-10-CM | POA: Diagnosis not present

## 2018-12-09 DIAGNOSIS — I69398 Other sequelae of cerebral infarction: Secondary | ICD-10-CM | POA: Diagnosis not present

## 2018-12-09 DIAGNOSIS — N184 Chronic kidney disease, stage 4 (severe): Secondary | ICD-10-CM | POA: Diagnosis not present

## 2018-12-09 DIAGNOSIS — H547 Unspecified visual loss: Secondary | ICD-10-CM | POA: Diagnosis not present

## 2018-12-09 DIAGNOSIS — Z9181 History of falling: Secondary | ICD-10-CM | POA: Diagnosis not present

## 2018-12-09 DIAGNOSIS — Z7984 Long term (current) use of oral hypoglycemic drugs: Secondary | ICD-10-CM | POA: Diagnosis not present

## 2018-12-09 DIAGNOSIS — Z86718 Personal history of other venous thrombosis and embolism: Secondary | ICD-10-CM | POA: Diagnosis not present

## 2018-12-09 DIAGNOSIS — I129 Hypertensive chronic kidney disease with stage 1 through stage 4 chronic kidney disease, or unspecified chronic kidney disease: Secondary | ICD-10-CM | POA: Diagnosis not present

## 2018-12-09 DIAGNOSIS — Z4800 Encounter for change or removal of nonsurgical wound dressing: Secondary | ICD-10-CM | POA: Diagnosis not present

## 2018-12-09 DIAGNOSIS — Z7901 Long term (current) use of anticoagulants: Secondary | ICD-10-CM | POA: Diagnosis not present

## 2018-12-09 DIAGNOSIS — M109 Gout, unspecified: Secondary | ICD-10-CM | POA: Diagnosis not present

## 2018-12-09 DIAGNOSIS — M6281 Muscle weakness (generalized): Secondary | ICD-10-CM | POA: Diagnosis not present

## 2018-12-09 DIAGNOSIS — L89322 Pressure ulcer of left buttock, stage 2: Secondary | ICD-10-CM | POA: Diagnosis not present

## 2018-12-09 DIAGNOSIS — E1122 Type 2 diabetes mellitus with diabetic chronic kidney disease: Secondary | ICD-10-CM | POA: Diagnosis not present

## 2018-12-09 DIAGNOSIS — D631 Anemia in chronic kidney disease: Secondary | ICD-10-CM | POA: Diagnosis not present

## 2018-12-12 ENCOUNTER — Ambulatory Visit (HOSPITAL_COMMUNITY)
Admission: RE | Admit: 2018-12-12 | Discharge: 2018-12-12 | Disposition: A | Discharge status: Home / Self Care | Payer: Medicare Other | Source: Ambulatory Visit | Attending: Nephrology | Admitting: Nephrology

## 2018-12-12 ENCOUNTER — Other Ambulatory Visit: Payer: Self-pay

## 2018-12-12 VITALS — BP 178/73 | HR 65 | Temp 96.6°F | Resp 20

## 2018-12-12 DIAGNOSIS — Z7901 Long term (current) use of anticoagulants: Secondary | ICD-10-CM | POA: Diagnosis not present

## 2018-12-12 DIAGNOSIS — N184 Chronic kidney disease, stage 4 (severe): Secondary | ICD-10-CM | POA: Diagnosis not present

## 2018-12-12 DIAGNOSIS — I69398 Other sequelae of cerebral infarction: Secondary | ICD-10-CM | POA: Diagnosis not present

## 2018-12-12 DIAGNOSIS — Z7984 Long term (current) use of oral hypoglycemic drugs: Secondary | ICD-10-CM | POA: Diagnosis not present

## 2018-12-12 DIAGNOSIS — I447 Left bundle-branch block, unspecified: Secondary | ICD-10-CM | POA: Diagnosis not present

## 2018-12-12 DIAGNOSIS — M6281 Muscle weakness (generalized): Secondary | ICD-10-CM | POA: Diagnosis not present

## 2018-12-12 DIAGNOSIS — I129 Hypertensive chronic kidney disease with stage 1 through stage 4 chronic kidney disease, or unspecified chronic kidney disease: Secondary | ICD-10-CM | POA: Diagnosis not present

## 2018-12-12 DIAGNOSIS — M109 Gout, unspecified: Secondary | ICD-10-CM | POA: Diagnosis not present

## 2018-12-12 DIAGNOSIS — Z9181 History of falling: Secondary | ICD-10-CM | POA: Diagnosis not present

## 2018-12-12 DIAGNOSIS — E1122 Type 2 diabetes mellitus with diabetic chronic kidney disease: Secondary | ICD-10-CM | POA: Diagnosis not present

## 2018-12-12 DIAGNOSIS — Z4800 Encounter for change or removal of nonsurgical wound dressing: Secondary | ICD-10-CM | POA: Diagnosis not present

## 2018-12-12 DIAGNOSIS — Z86718 Personal history of other venous thrombosis and embolism: Secondary | ICD-10-CM | POA: Diagnosis not present

## 2018-12-12 DIAGNOSIS — M1611 Unilateral primary osteoarthritis, right hip: Secondary | ICD-10-CM | POA: Diagnosis not present

## 2018-12-12 DIAGNOSIS — D631 Anemia in chronic kidney disease: Secondary | ICD-10-CM | POA: Diagnosis not present

## 2018-12-12 DIAGNOSIS — L89322 Pressure ulcer of left buttock, stage 2: Secondary | ICD-10-CM | POA: Diagnosis not present

## 2018-12-12 DIAGNOSIS — H547 Unspecified visual loss: Secondary | ICD-10-CM | POA: Diagnosis not present

## 2018-12-12 LAB — POCT HEMOGLOBIN-HEMACUE: Hemoglobin: 11.9 g/dL — ABNORMAL LOW (ref 12.0–15.0)

## 2018-12-12 MED ORDER — EPOETIN ALFA-EPBX 10000 UNIT/ML IJ SOLN
10000.0000 [IU] | INTRAMUSCULAR | Status: DC
  Administered 2018-12-12: 10000 [IU] via SUBCUTANEOUS
  Filled 2018-12-12: qty 1

## 2018-12-15 DIAGNOSIS — D631 Anemia in chronic kidney disease: Secondary | ICD-10-CM | POA: Diagnosis not present

## 2018-12-15 DIAGNOSIS — I447 Left bundle-branch block, unspecified: Secondary | ICD-10-CM | POA: Diagnosis not present

## 2018-12-15 DIAGNOSIS — I129 Hypertensive chronic kidney disease with stage 1 through stage 4 chronic kidney disease, or unspecified chronic kidney disease: Secondary | ICD-10-CM | POA: Diagnosis not present

## 2018-12-15 DIAGNOSIS — E1122 Type 2 diabetes mellitus with diabetic chronic kidney disease: Secondary | ICD-10-CM | POA: Diagnosis not present

## 2018-12-15 DIAGNOSIS — Z7901 Long term (current) use of anticoagulants: Secondary | ICD-10-CM | POA: Diagnosis not present

## 2018-12-15 DIAGNOSIS — Z7984 Long term (current) use of oral hypoglycemic drugs: Secondary | ICD-10-CM | POA: Diagnosis not present

## 2018-12-15 DIAGNOSIS — L89322 Pressure ulcer of left buttock, stage 2: Secondary | ICD-10-CM | POA: Diagnosis not present

## 2018-12-15 DIAGNOSIS — H547 Unspecified visual loss: Secondary | ICD-10-CM | POA: Diagnosis not present

## 2018-12-15 DIAGNOSIS — Z4800 Encounter for change or removal of nonsurgical wound dressing: Secondary | ICD-10-CM | POA: Diagnosis not present

## 2018-12-15 DIAGNOSIS — Z9181 History of falling: Secondary | ICD-10-CM | POA: Diagnosis not present

## 2018-12-15 DIAGNOSIS — N184 Chronic kidney disease, stage 4 (severe): Secondary | ICD-10-CM | POA: Diagnosis not present

## 2018-12-15 DIAGNOSIS — M1611 Unilateral primary osteoarthritis, right hip: Secondary | ICD-10-CM | POA: Diagnosis not present

## 2018-12-15 DIAGNOSIS — I69398 Other sequelae of cerebral infarction: Secondary | ICD-10-CM | POA: Diagnosis not present

## 2018-12-15 DIAGNOSIS — Z86718 Personal history of other venous thrombosis and embolism: Secondary | ICD-10-CM | POA: Diagnosis not present

## 2018-12-15 DIAGNOSIS — M6281 Muscle weakness (generalized): Secondary | ICD-10-CM | POA: Diagnosis not present

## 2018-12-15 DIAGNOSIS — M109 Gout, unspecified: Secondary | ICD-10-CM | POA: Diagnosis not present

## 2018-12-19 DIAGNOSIS — M109 Gout, unspecified: Secondary | ICD-10-CM | POA: Diagnosis not present

## 2018-12-19 DIAGNOSIS — I447 Left bundle-branch block, unspecified: Secondary | ICD-10-CM | POA: Diagnosis not present

## 2018-12-19 DIAGNOSIS — D631 Anemia in chronic kidney disease: Secondary | ICD-10-CM | POA: Diagnosis not present

## 2018-12-19 DIAGNOSIS — L89322 Pressure ulcer of left buttock, stage 2: Secondary | ICD-10-CM | POA: Diagnosis not present

## 2018-12-19 DIAGNOSIS — Z9181 History of falling: Secondary | ICD-10-CM | POA: Diagnosis not present

## 2018-12-19 DIAGNOSIS — Z7901 Long term (current) use of anticoagulants: Secondary | ICD-10-CM | POA: Diagnosis not present

## 2018-12-19 DIAGNOSIS — E1122 Type 2 diabetes mellitus with diabetic chronic kidney disease: Secondary | ICD-10-CM | POA: Diagnosis not present

## 2018-12-19 DIAGNOSIS — M1611 Unilateral primary osteoarthritis, right hip: Secondary | ICD-10-CM | POA: Diagnosis not present

## 2018-12-19 DIAGNOSIS — M6281 Muscle weakness (generalized): Secondary | ICD-10-CM | POA: Diagnosis not present

## 2018-12-19 DIAGNOSIS — I69398 Other sequelae of cerebral infarction: Secondary | ICD-10-CM | POA: Diagnosis not present

## 2018-12-19 DIAGNOSIS — Z4800 Encounter for change or removal of nonsurgical wound dressing: Secondary | ICD-10-CM | POA: Diagnosis not present

## 2018-12-19 DIAGNOSIS — I129 Hypertensive chronic kidney disease with stage 1 through stage 4 chronic kidney disease, or unspecified chronic kidney disease: Secondary | ICD-10-CM | POA: Diagnosis not present

## 2018-12-19 DIAGNOSIS — N184 Chronic kidney disease, stage 4 (severe): Secondary | ICD-10-CM | POA: Diagnosis not present

## 2018-12-19 DIAGNOSIS — H547 Unspecified visual loss: Secondary | ICD-10-CM | POA: Diagnosis not present

## 2018-12-19 DIAGNOSIS — Z86718 Personal history of other venous thrombosis and embolism: Secondary | ICD-10-CM | POA: Diagnosis not present

## 2018-12-19 DIAGNOSIS — Z7984 Long term (current) use of oral hypoglycemic drugs: Secondary | ICD-10-CM | POA: Diagnosis not present

## 2018-12-20 DIAGNOSIS — R600 Localized edema: Secondary | ICD-10-CM | POA: Diagnosis not present

## 2018-12-20 DIAGNOSIS — I129 Hypertensive chronic kidney disease with stage 1 through stage 4 chronic kidney disease, or unspecified chronic kidney disease: Secondary | ICD-10-CM | POA: Diagnosis not present

## 2018-12-20 DIAGNOSIS — N2581 Secondary hyperparathyroidism of renal origin: Secondary | ICD-10-CM | POA: Diagnosis not present

## 2018-12-20 DIAGNOSIS — D631 Anemia in chronic kidney disease: Secondary | ICD-10-CM | POA: Diagnosis not present

## 2018-12-20 DIAGNOSIS — N184 Chronic kidney disease, stage 4 (severe): Secondary | ICD-10-CM | POA: Diagnosis not present

## 2018-12-23 DIAGNOSIS — M1611 Unilateral primary osteoarthritis, right hip: Secondary | ICD-10-CM | POA: Diagnosis not present

## 2018-12-23 DIAGNOSIS — N184 Chronic kidney disease, stage 4 (severe): Secondary | ICD-10-CM | POA: Diagnosis not present

## 2018-12-23 DIAGNOSIS — M6281 Muscle weakness (generalized): Secondary | ICD-10-CM | POA: Diagnosis not present

## 2018-12-23 DIAGNOSIS — H547 Unspecified visual loss: Secondary | ICD-10-CM | POA: Diagnosis not present

## 2018-12-23 DIAGNOSIS — Z7901 Long term (current) use of anticoagulants: Secondary | ICD-10-CM | POA: Diagnosis not present

## 2018-12-23 DIAGNOSIS — L89322 Pressure ulcer of left buttock, stage 2: Secondary | ICD-10-CM | POA: Diagnosis not present

## 2018-12-23 DIAGNOSIS — D631 Anemia in chronic kidney disease: Secondary | ICD-10-CM | POA: Diagnosis not present

## 2018-12-23 DIAGNOSIS — I69398 Other sequelae of cerebral infarction: Secondary | ICD-10-CM | POA: Diagnosis not present

## 2018-12-23 DIAGNOSIS — I129 Hypertensive chronic kidney disease with stage 1 through stage 4 chronic kidney disease, or unspecified chronic kidney disease: Secondary | ICD-10-CM | POA: Diagnosis not present

## 2018-12-23 DIAGNOSIS — E1122 Type 2 diabetes mellitus with diabetic chronic kidney disease: Secondary | ICD-10-CM | POA: Diagnosis not present

## 2018-12-23 DIAGNOSIS — Z9181 History of falling: Secondary | ICD-10-CM | POA: Diagnosis not present

## 2018-12-23 DIAGNOSIS — Z7984 Long term (current) use of oral hypoglycemic drugs: Secondary | ICD-10-CM | POA: Diagnosis not present

## 2018-12-23 DIAGNOSIS — M109 Gout, unspecified: Secondary | ICD-10-CM | POA: Diagnosis not present

## 2018-12-23 DIAGNOSIS — Z4800 Encounter for change or removal of nonsurgical wound dressing: Secondary | ICD-10-CM | POA: Diagnosis not present

## 2018-12-23 DIAGNOSIS — I447 Left bundle-branch block, unspecified: Secondary | ICD-10-CM | POA: Diagnosis not present

## 2018-12-23 DIAGNOSIS — Z86718 Personal history of other venous thrombosis and embolism: Secondary | ICD-10-CM | POA: Diagnosis not present

## 2018-12-26 ENCOUNTER — Other Ambulatory Visit: Payer: Self-pay

## 2018-12-26 ENCOUNTER — Ambulatory Visit (HOSPITAL_COMMUNITY)
Admission: RE | Admit: 2018-12-26 | Discharge: 2018-12-26 | Disposition: A | Discharge status: Home / Self Care | Payer: Medicare Other | Source: Ambulatory Visit | Attending: Nephrology | Admitting: Nephrology

## 2018-12-26 VITALS — BP 162/73 | HR 72 | Temp 97.7°F | Resp 20

## 2018-12-26 DIAGNOSIS — H547 Unspecified visual loss: Secondary | ICD-10-CM | POA: Diagnosis not present

## 2018-12-26 DIAGNOSIS — Z7901 Long term (current) use of anticoagulants: Secondary | ICD-10-CM | POA: Diagnosis not present

## 2018-12-26 DIAGNOSIS — N184 Chronic kidney disease, stage 4 (severe): Secondary | ICD-10-CM | POA: Diagnosis not present

## 2018-12-26 DIAGNOSIS — I69398 Other sequelae of cerebral infarction: Secondary | ICD-10-CM | POA: Diagnosis not present

## 2018-12-26 DIAGNOSIS — Z7984 Long term (current) use of oral hypoglycemic drugs: Secondary | ICD-10-CM | POA: Diagnosis not present

## 2018-12-26 DIAGNOSIS — Z86718 Personal history of other venous thrombosis and embolism: Secondary | ICD-10-CM | POA: Diagnosis not present

## 2018-12-26 DIAGNOSIS — M6281 Muscle weakness (generalized): Secondary | ICD-10-CM | POA: Diagnosis not present

## 2018-12-26 DIAGNOSIS — E1122 Type 2 diabetes mellitus with diabetic chronic kidney disease: Secondary | ICD-10-CM | POA: Diagnosis not present

## 2018-12-26 DIAGNOSIS — I129 Hypertensive chronic kidney disease with stage 1 through stage 4 chronic kidney disease, or unspecified chronic kidney disease: Secondary | ICD-10-CM | POA: Diagnosis not present

## 2018-12-26 DIAGNOSIS — D631 Anemia in chronic kidney disease: Secondary | ICD-10-CM | POA: Diagnosis not present

## 2018-12-26 DIAGNOSIS — I447 Left bundle-branch block, unspecified: Secondary | ICD-10-CM | POA: Diagnosis not present

## 2018-12-26 DIAGNOSIS — M109 Gout, unspecified: Secondary | ICD-10-CM | POA: Diagnosis not present

## 2018-12-26 DIAGNOSIS — Z4800 Encounter for change or removal of nonsurgical wound dressing: Secondary | ICD-10-CM | POA: Diagnosis not present

## 2018-12-26 DIAGNOSIS — L89322 Pressure ulcer of left buttock, stage 2: Secondary | ICD-10-CM | POA: Diagnosis not present

## 2018-12-26 DIAGNOSIS — M1611 Unilateral primary osteoarthritis, right hip: Secondary | ICD-10-CM | POA: Diagnosis not present

## 2018-12-26 DIAGNOSIS — Z9181 History of falling: Secondary | ICD-10-CM | POA: Diagnosis not present

## 2018-12-26 LAB — IRON AND TIBC
Iron: 38 ug/dL (ref 28–170)
Saturation Ratios: 16 % (ref 10.4–31.8)
TIBC: 239 ug/dL — ABNORMAL LOW (ref 250–450)
UIBC: 201 ug/dL

## 2018-12-26 LAB — POCT HEMOGLOBIN-HEMACUE: Hemoglobin: 12.1 g/dL (ref 12.0–15.0)

## 2018-12-26 LAB — FERRITIN: Ferritin: 205 ng/mL (ref 11–307)

## 2018-12-26 MED ORDER — EPOETIN ALFA-EPBX 10000 UNIT/ML IJ SOLN
10000.0000 [IU] | INTRAMUSCULAR | Status: DC
  Filled 2018-12-26: qty 1

## 2019-01-03 ENCOUNTER — Telehealth: Payer: Self-pay | Admitting: Internal Medicine

## 2019-01-03 DIAGNOSIS — M6281 Muscle weakness (generalized): Secondary | ICD-10-CM | POA: Diagnosis not present

## 2019-01-03 DIAGNOSIS — Z7901 Long term (current) use of anticoagulants: Secondary | ICD-10-CM | POA: Diagnosis not present

## 2019-01-03 DIAGNOSIS — D631 Anemia in chronic kidney disease: Secondary | ICD-10-CM | POA: Diagnosis not present

## 2019-01-03 DIAGNOSIS — L89322 Pressure ulcer of left buttock, stage 2: Secondary | ICD-10-CM | POA: Diagnosis not present

## 2019-01-03 DIAGNOSIS — E1122 Type 2 diabetes mellitus with diabetic chronic kidney disease: Secondary | ICD-10-CM | POA: Diagnosis not present

## 2019-01-03 DIAGNOSIS — Z4800 Encounter for change or removal of nonsurgical wound dressing: Secondary | ICD-10-CM | POA: Diagnosis not present

## 2019-01-03 DIAGNOSIS — Z7984 Long term (current) use of oral hypoglycemic drugs: Secondary | ICD-10-CM | POA: Diagnosis not present

## 2019-01-03 DIAGNOSIS — I69398 Other sequelae of cerebral infarction: Secondary | ICD-10-CM | POA: Diagnosis not present

## 2019-01-03 DIAGNOSIS — Z9181 History of falling: Secondary | ICD-10-CM | POA: Diagnosis not present

## 2019-01-03 DIAGNOSIS — I447 Left bundle-branch block, unspecified: Secondary | ICD-10-CM | POA: Diagnosis not present

## 2019-01-03 DIAGNOSIS — H547 Unspecified visual loss: Secondary | ICD-10-CM | POA: Diagnosis not present

## 2019-01-03 DIAGNOSIS — M109 Gout, unspecified: Secondary | ICD-10-CM | POA: Diagnosis not present

## 2019-01-03 DIAGNOSIS — I129 Hypertensive chronic kidney disease with stage 1 through stage 4 chronic kidney disease, or unspecified chronic kidney disease: Secondary | ICD-10-CM | POA: Diagnosis not present

## 2019-01-03 DIAGNOSIS — N184 Chronic kidney disease, stage 4 (severe): Secondary | ICD-10-CM | POA: Diagnosis not present

## 2019-01-03 DIAGNOSIS — Z86718 Personal history of other venous thrombosis and embolism: Secondary | ICD-10-CM | POA: Diagnosis not present

## 2019-01-03 DIAGNOSIS — M1611 Unilateral primary osteoarthritis, right hip: Secondary | ICD-10-CM | POA: Diagnosis not present

## 2019-01-03 NOTE — Telephone Encounter (Signed)
Returned call to Gregary Signs, Therapist, sports with [name] for clarification. No answer. Left message on VM requesting return call. Hubbard Hartshorn, RN, BSN

## 2019-01-03 NOTE — Telephone Encounter (Signed)
Gregary Signs returned call. Wants to report that wound is all healed; skin looks great. However, patient/family are not checking her CBGs and PCA is not allowed to per Agency rules. CBG 347 today in random check by Gregary Signs. Also, patient sits up all day with legs dangling. Right leg with 1-2+ pitting edema and leg leg with trace to 1. States PCA will put on ted hose during day but family will not take off before bed so patient either has them on continuously or not at all. Gregary Signs spoke with patient's daughter-in- law and educated her on these matters but wanted PCP to be aware. Hubbard Hartshorn, RN, BSN

## 2019-01-03 NOTE — Telephone Encounter (Signed)
Thank you for letting me know

## 2019-01-03 NOTE — Telephone Encounter (Signed)
Per Carmie Kanner [phone]; discharge from the agency and wound healed Her family is not blood sugar, blood sugar is 347

## 2019-01-09 ENCOUNTER — Ambulatory Visit (HOSPITAL_COMMUNITY)
Admission: RE | Admit: 2019-01-09 | Discharge: 2019-01-09 | Disposition: A | Discharge status: Home / Self Care | Payer: Medicare Other | Source: Ambulatory Visit | Attending: Nephrology | Admitting: Nephrology

## 2019-01-09 ENCOUNTER — Other Ambulatory Visit: Payer: Self-pay

## 2019-01-09 VITALS — BP 135/65 | HR 62 | Temp 95.6°F | Resp 20

## 2019-01-09 DIAGNOSIS — N184 Chronic kidney disease, stage 4 (severe): Secondary | ICD-10-CM | POA: Insufficient documentation

## 2019-01-09 LAB — POCT HEMOGLOBIN-HEMACUE: Hemoglobin: 11 g/dL — ABNORMAL LOW (ref 12.0–15.0)

## 2019-01-09 MED ORDER — EPOETIN ALFA-EPBX 10000 UNIT/ML IJ SOLN
10000.0000 [IU] | INTRAMUSCULAR | Status: DC
  Administered 2019-01-09: 10000 [IU] via SUBCUTANEOUS
  Filled 2019-01-09: qty 1

## 2019-01-23 ENCOUNTER — Other Ambulatory Visit: Payer: Self-pay

## 2019-01-23 ENCOUNTER — Ambulatory Visit (HOSPITAL_COMMUNITY)
Admission: RE | Admit: 2019-01-23 | Discharge: 2019-01-23 | Disposition: A | Discharge status: Home / Self Care | Payer: Medicare Other | Source: Ambulatory Visit | Attending: Nephrology | Admitting: Nephrology

## 2019-01-23 VITALS — BP 148/61 | HR 65 | Temp 97.8°F | Resp 18

## 2019-01-23 DIAGNOSIS — N184 Chronic kidney disease, stage 4 (severe): Secondary | ICD-10-CM | POA: Diagnosis not present

## 2019-01-23 LAB — POCT HEMOGLOBIN-HEMACUE: Hemoglobin: 11.5 g/dL — ABNORMAL LOW (ref 12.0–15.0)

## 2019-01-23 MED ORDER — EPOETIN ALFA-EPBX 10000 UNIT/ML IJ SOLN
10000.0000 [IU] | INTRAMUSCULAR | Status: DC
  Administered 2019-01-23: 10000 [IU] via SUBCUTANEOUS
  Filled 2019-01-23: qty 1

## 2019-01-24 ENCOUNTER — Other Ambulatory Visit: Payer: Self-pay | Admitting: Internal Medicine

## 2019-01-24 DIAGNOSIS — M1A3710 Chronic gout due to renal impairment, right ankle and foot, without tophus (tophi): Secondary | ICD-10-CM

## 2019-01-25 ENCOUNTER — Telehealth: Payer: Self-pay

## 2019-01-25 NOTE — Telephone Encounter (Signed)
Refill fax request from [facility] for Clonidine 0.3mg /Day patch for once a week dosing. Medication has expired and fallen off the med list.  Rx was last sent on 11/06/18 LOV 10/25/18 Will forward to PCP. Thanks, SChaplin, RN,BSN

## 2019-01-29 ENCOUNTER — Other Ambulatory Visit: Payer: Self-pay | Admitting: Internal Medicine

## 2019-01-29 NOTE — Telephone Encounter (Signed)
I am not sure why the clonidine has fallen off the med list, but patient was previously doing well on it.

## 2019-01-29 NOTE — Telephone Encounter (Signed)
Second fax request received from [facility] for clonidine 0.3mg /day patch. Also requesting refill on furosemide 40 mg daily. Hubbard Hartshorn, RN, BSN

## 2019-01-29 NOTE — Telephone Encounter (Signed)
Just spoke with patient's daughter in law, Cathie Olden, and agreed to bring her in for an appt next Tuesday 02/06/2019 at 2:15 pm.

## 2019-01-29 NOTE — Progress Notes (Unsigned)
Updated patient's allopurinol dosing to 50mg  every other day based on renal failure and last gfr 19 in May 2020.   [name], MD Internal Medicine PGY2 WYSOR:[phone] , 8:45 AM

## 2019-01-29 NOTE — Telephone Encounter (Signed)
As it has been 5 months since the patient has been evaluated in person, I would like to see how she and her caregivers have been doing with managing all her medications. Her functional status and blood pressure should be re-evaluated. Please add her to acc schedule as I am not in clinic till August. Thank you!  [name], MD Internal Medicine PGY2 Pager:[phone]

## 2019-01-31 ENCOUNTER — Other Ambulatory Visit: Payer: Self-pay | Admitting: Internal Medicine

## 2019-01-31 MED ORDER — CLONIDINE 0.3 MG/24HR TD PTWK: 0.3000 mg | MEDICATED_PATCH | TRANSDERMAL | 0 refills | Status: DC

## 2019-01-31 NOTE — Telephone Encounter (Signed)
Lasix supposed to be 80mg  not 40mg  daily. Filled clonidine in meanwhile. Thanks!

## 2019-02-05 ENCOUNTER — Other Ambulatory Visit (HOSPITAL_COMMUNITY): Payer: Self-pay | Admitting: *Deleted

## 2019-02-06 ENCOUNTER — Other Ambulatory Visit: Payer: Self-pay

## 2019-02-06 ENCOUNTER — Encounter (HOSPITAL_COMMUNITY): Payer: Medicare Other

## 2019-02-06 ENCOUNTER — Ambulatory Visit (HOSPITAL_COMMUNITY)
Admission: RE | Admit: 2019-02-06 | Discharge: 2019-02-06 | Disposition: A | Discharge status: Home / Self Care | Payer: Medicare Other | Source: Ambulatory Visit | Attending: Nephrology | Admitting: Nephrology

## 2019-02-06 ENCOUNTER — Encounter: Payer: Self-pay | Admitting: Internal Medicine

## 2019-02-06 ENCOUNTER — Ambulatory Visit (INDEPENDENT_AMBULATORY_CARE_PROVIDER_SITE_OTHER): Payer: Medicare Other | Admitting: Internal Medicine

## 2019-02-06 VITALS — BP 125/59 | HR 60 | Temp 97.2°F | Resp 20 | Ht 64.0 in | Wt 125.0 lb

## 2019-02-06 VITALS — BP 153/60 | HR 61 | Temp 97.5°F | Wt 125.6 lb

## 2019-02-06 DIAGNOSIS — N184 Chronic kidney disease, stage 4 (severe): Secondary | ICD-10-CM | POA: Diagnosis not present

## 2019-02-06 DIAGNOSIS — E1159 Type 2 diabetes mellitus with other circulatory complications: Secondary | ICD-10-CM | POA: Diagnosis not present

## 2019-02-06 DIAGNOSIS — Z79899 Other long term (current) drug therapy: Secondary | ICD-10-CM | POA: Diagnosis not present

## 2019-02-06 DIAGNOSIS — I129 Hypertensive chronic kidney disease with stage 1 through stage 4 chronic kidney disease, or unspecified chronic kidney disease: Secondary | ICD-10-CM | POA: Diagnosis not present

## 2019-02-06 DIAGNOSIS — M7989 Other specified soft tissue disorders: Secondary | ICD-10-CM

## 2019-02-06 DIAGNOSIS — F0390 Unspecified dementia without behavioral disturbance: Secondary | ICD-10-CM

## 2019-02-06 DIAGNOSIS — I10 Essential (primary) hypertension: Secondary | ICD-10-CM | POA: Diagnosis not present

## 2019-02-06 LAB — POCT HEMOGLOBIN-HEMACUE: Hemoglobin: 12 g/dL (ref 12.0–15.0)

## 2019-02-06 MED ORDER — EPOETIN ALFA-EPBX 10000 UNIT/ML IJ SOLN
10000.0000 [IU] | INTRAMUSCULAR | Status: DC
  Filled 2019-02-06: qty 1

## 2019-02-06 MED ORDER — SODIUM CHLORIDE 0.9 % IV SOLN
510.0000 mg | Freq: Once | INTRAVENOUS | Status: AC
  Administered 2019-02-06: 510 mg via INTRAVENOUS
  Filled 2019-02-06: qty 17

## 2019-02-06 NOTE — Progress Notes (Signed)
   CC: follow up of high blood pressure  HPI:  Chelsea Mcdaniel is a 85 y.o. with PMHx as documented below, came in for HTN follow up. Please refer to problem based charting for further details and assessment of plan of current problem and chronic medical conditions.  Past Medical History:  Diagnosis Date  . Anemia     normocytic anemia with baseline hemoglobin 10-11  . Blindness of left eye     likely related to stroke, left cataract removed from that eye with complications  . Calculus gallbladder and bile duct with cholecystitis with obstruction 01/2015  . Chronic kidney disease 2006    left renal artery stenosis with probable hemodynamic significance, kidneys are normal in morphology without focal lesions or hydronephrosis this is based but cannot A. of the abdomen with and without contrast done on the 31st 2006  . CKD (chronic kidney disease), stage III ([place])   . CVA (cerebral vascular accident) ([place]) 1990's  . CVA (cerebrovascular accident) [facility])  October 2007    CT of the head atrophy with multiple remote insults noted but no definite acute findings  . Glaucoma   . Gout   . Hyperlipidemia   . Hypertension   . Personal history of DVT (deep vein thrombosis) 08/11/2010   BLE  . Type II diabetes mellitus ([place])    Review of Systems:   Review of Systems  Respiratory: Negative for cough and shortness of breath.   Cardiovascular: Positive for leg swelling. Negative for chest pain.  Neurological: Negative for dizziness and headaches.    Physical Exam:  Vitals:   02/06/19 1443  BP: (!) 153/60  Pulse: 61  Temp: (!) 97.5 F (36.4 C)  TempSrc: Oral  SpO2: 100%  Weight: 125 lb 9.6 oz (57 kg)   Physical Exam Constitutional:      General: She is not in acute distress.    Appearance: She is not ill-appearing.  HENT:     Head: Normocephalic and atraumatic.  Eyes:     Extraocular Movements: Extraocular movements intact.     Conjunctiva/sclera: Conjunctivae normal.   Cardiovascular:     Rate and Rhythm: Normal rate and regular rhythm.     Pulses: Normal pulses.     Heart sounds: No murmur.  Pulmonary:     Effort: Pulmonary effort is normal. No respiratory distress.     Breath sounds: Normal breath sounds. No wheezing or rales.  Abdominal:     General: There is no distension.     Palpations: Abdomen is soft.  Musculoskeletal:        General: No tenderness.     Right lower leg: Edema present.  Skin:    General: Skin is warm and dry.  Neurological:     Mental Status: She is alert. Mental status is at baseline.     Comments: Oriented to place and person. (With baseline dementia)  Psychiatric:        Mood and Affect: Mood normal.        Behavior: Behavior normal.     Assessment & Plan:   See Encounters Tab for problem based charting.  Patient discussed with Dr. Evette Doffing

## 2019-02-06 NOTE — Patient Instructions (Signed)
It was our pleasure taking care of you in our clinic today.  You were seen for blood pressure follow up. I did not make any medication changes today as your blood pressure was close to be controled. Please take your mediations as before and call us to report the blood pressure at home. I send you to lab for some blood work and the result will be abnormal. Please come back to clinic in 2 months for blood pressure check and diabetes follow up or earlier as needed. For next visit, please bring your medications and blood pressure log.  Thank you and take care,  Dr. Linna Hoff

## 2019-02-06 NOTE — Assessment & Plan Note (Addendum)
BP today 153/60. Currently on Amlodipine 10 mg QD, Clonidin patch 0.3 mg q 7 days and Lasix (liquid form) 80 mg at AM and 40 mg at PM. (Dose increased by her nephrologist on last visit 12/20/2018) and per her daughter in law, she and Ms. Mcclain's aid assist her for taking right amount of medication.  She denies any headache or dizziness. Also no further episode of fall. Patient has not brought her home BP log-book and she and her daughter in law do not remember the BP range at home. Given wide pulse pressure and previous Hx of fall and probable orthostatic hypotension, we do not increase her antihypertensive medications.  -Continue current medications -BMP today -F/u in clinic in 2 months -Patient's family to call clinic to report recent BP range at home -bring medications and BP log to clinic on next visit

## 2019-02-06 NOTE — Assessment & Plan Note (Signed)
She follows up with nephrologist at [facility]. Last visit was at 11/2018 and Lasix dose increased due to LE edema.

## 2019-02-07 ENCOUNTER — Other Ambulatory Visit: Payer: Self-pay | Admitting: Internal Medicine

## 2019-02-07 DIAGNOSIS — N184 Chronic kidney disease, stage 4 (severe): Secondary | ICD-10-CM

## 2019-02-07 LAB — BMP8+ANION GAP
Anion Gap: 22 mmol/L — ABNORMAL HIGH (ref 10.0–18.0)
BUN/Creatinine Ratio: 15 (ref 12–28)
BUN: 46 mg/dL — ABNORMAL HIGH (ref 8–27)
CO2: 20 mmol/L (ref 20–29)
Calcium: 9.4 mg/dL (ref 8.7–10.3)
Chloride: 101 mmol/L (ref 96–106)
Creatinine, Ser: 3.15 mg/dL — ABNORMAL HIGH (ref 0.57–1.00)
GFR calc Af Amer: 14 mL/min/{1.73_m2} — ABNORMAL LOW (ref >59)
GFR calc non Af Amer: 13 mL/min/{1.73_m2} — ABNORMAL LOW (ref >59)
Glucose: 239 mg/dL — ABNORMAL HIGH (ref 65–99)
Potassium: 4.9 mmol/L (ref 3.5–5.2)
Sodium: 143 mmol/L (ref 134–144)

## 2019-02-07 NOTE — Progress Notes (Signed)
Internal Medicine Clinic Attending  Case discussed with Dr. Masoudi  at the time of the visit.  We reviewed the resident's history and exam and pertinent patient test results.  I agree with the assessment, diagnosis, and plan of care documented in the resident's note.  

## 2019-02-07 NOTE — Progress Notes (Signed)
BMP showes worsening of kidney function. With Cr of 3.1 and GFR 13. (Cr was 2.5 two months ago and 1.6 five months ago). I called Chelsea Mcdaniel and updated her. Instructed to:  -Hold Lasix for 3 days and then reevaluate by [place] or nephrologist next week -Renal US -Advised to call her nephrologist to get an appointment hopefully in next few days.) Patient's daughter in Mcdaniel will let us know if they can not get an appointment soon) -Advised to call us back if developed sever edema or SOB while off of Lasix or if noticed any change in urine,.. -Will f/u

## 2019-02-08 ENCOUNTER — Other Ambulatory Visit: Payer: Self-pay | Admitting: Internal Medicine

## 2019-02-20 ENCOUNTER — Other Ambulatory Visit: Payer: Self-pay

## 2019-02-20 ENCOUNTER — Ambulatory Visit (HOSPITAL_COMMUNITY)
Admission: RE | Admit: 2019-02-20 | Discharge: 2019-02-20 | Disposition: A | Discharge status: Home / Self Care | Payer: Medicare Other | Source: Ambulatory Visit | Attending: Nephrology | Admitting: Nephrology

## 2019-02-20 VITALS — BP 179/93 | HR 66 | Temp 97.6°F

## 2019-02-20 DIAGNOSIS — N2581 Secondary hyperparathyroidism of renal origin: Secondary | ICD-10-CM | POA: Diagnosis not present

## 2019-02-20 DIAGNOSIS — I129 Hypertensive chronic kidney disease with stage 1 through stage 4 chronic kidney disease, or unspecified chronic kidney disease: Secondary | ICD-10-CM | POA: Diagnosis not present

## 2019-02-20 DIAGNOSIS — N184 Chronic kidney disease, stage 4 (severe): Secondary | ICD-10-CM | POA: Insufficient documentation

## 2019-02-20 DIAGNOSIS — R600 Localized edema: Secondary | ICD-10-CM | POA: Diagnosis not present

## 2019-02-20 DIAGNOSIS — D631 Anemia in chronic kidney disease: Secondary | ICD-10-CM | POA: Diagnosis not present

## 2019-02-20 LAB — IRON AND TIBC
Iron: 87 ug/dL (ref 28–170)
Saturation Ratios: 42 % — ABNORMAL HIGH (ref 10.4–31.8)
TIBC: 206 ug/dL — ABNORMAL LOW (ref 250–450)
UIBC: 119 ug/dL

## 2019-02-20 LAB — FERRITIN: Ferritin: 657 ng/mL — ABNORMAL HIGH (ref 11–307)

## 2019-02-20 LAB — POCT HEMOGLOBIN-HEMACUE: Hemoglobin: 11.3 g/dL — ABNORMAL LOW (ref 12.0–15.0)

## 2019-02-20 MED ORDER — EPOETIN ALFA-EPBX 10000 UNIT/ML IJ SOLN
10000.0000 [IU] | INTRAMUSCULAR | Status: DC
  Administered 2019-02-20: 10000 [IU] via SUBCUTANEOUS
  Filled 2019-02-20: qty 1

## 2019-02-27 ENCOUNTER — Other Ambulatory Visit: Payer: Self-pay | Admitting: Internal Medicine

## 2019-02-27 DIAGNOSIS — E118 Type 2 diabetes mellitus with unspecified complications: Secondary | ICD-10-CM

## 2019-03-01 ENCOUNTER — Other Ambulatory Visit: Payer: Self-pay | Admitting: *Deleted

## 2019-03-01 MED ORDER — CLONIDINE 0.3 MG/24HR TD PTWK: 0.3000 mg | MEDICATED_PATCH | TRANSDERMAL | 1 refills | Status: DC

## 2019-03-04 DIAGNOSIS — R296 Repeated falls: Secondary | ICD-10-CM | POA: Diagnosis not present

## 2019-03-04 DIAGNOSIS — N183 Chronic kidney disease, stage 3 (moderate): Secondary | ICD-10-CM | POA: Diagnosis not present

## 2019-03-05 ENCOUNTER — Other Ambulatory Visit: Payer: Self-pay

## 2019-03-06 ENCOUNTER — Ambulatory Visit (HOSPITAL_COMMUNITY)
Admission: RE | Admit: 2019-03-06 | Discharge: 2019-03-06 | Disposition: A | Discharge status: Home / Self Care | Payer: Medicare Other | Source: Ambulatory Visit | Attending: Nephrology | Admitting: Nephrology

## 2019-03-06 VITALS — BP 155/64 | HR 58 | Temp 97.3°F | Resp 20

## 2019-03-06 DIAGNOSIS — N184 Chronic kidney disease, stage 4 (severe): Secondary | ICD-10-CM | POA: Diagnosis not present

## 2019-03-06 LAB — POCT HEMOGLOBIN-HEMACUE: Hemoglobin: 11.7 g/dL — ABNORMAL LOW (ref 12.0–15.0)

## 2019-03-06 MED ORDER — EPOETIN ALFA-EPBX 10000 UNIT/ML IJ SOLN
10000.0000 [IU] | INTRAMUSCULAR | Status: DC
  Administered 2019-03-06: 14:00:00 10000 [IU] via SUBCUTANEOUS
  Filled 2019-03-06: qty 1

## 2019-03-15 ENCOUNTER — Telehealth: Payer: Self-pay | Admitting: Internal Medicine

## 2019-03-15 NOTE — Telephone Encounter (Signed)
[place] [phone] calling to report left leg swelling.  RN requesting a new pair of Compression Stocking. Per the family patient has lost her old pair

## 2019-03-16 ENCOUNTER — Other Ambulatory Visit: Payer: Self-pay | Admitting: Internal Medicine

## 2019-03-16 DIAGNOSIS — Z86718 Personal history of other venous thrombosis and embolism: Secondary | ICD-10-CM

## 2019-03-16 NOTE — Progress Notes (Signed)
Placed order for compression stockings.

## 2019-03-20 ENCOUNTER — Ambulatory Visit (HOSPITAL_COMMUNITY)
Admission: RE | Admit: 2019-03-20 | Discharge: 2019-03-20 | Disposition: A | Discharge status: Home / Self Care | Payer: Medicare Other | Source: Ambulatory Visit | Attending: Nephrology | Admitting: Nephrology

## 2019-03-20 ENCOUNTER — Other Ambulatory Visit: Payer: Self-pay | Admitting: Internal Medicine

## 2019-03-20 ENCOUNTER — Other Ambulatory Visit: Payer: Self-pay

## 2019-03-20 VITALS — BP 172/70 | Temp 96.5°F | Resp 20

## 2019-03-20 DIAGNOSIS — N184 Chronic kidney disease, stage 4 (severe): Secondary | ICD-10-CM | POA: Diagnosis not present

## 2019-03-20 DIAGNOSIS — E1159 Type 2 diabetes mellitus with other circulatory complications: Secondary | ICD-10-CM

## 2019-03-20 DIAGNOSIS — I152 Hypertension secondary to endocrine disorders: Secondary | ICD-10-CM

## 2019-03-20 LAB — FERRITIN: Ferritin: 592 ng/mL — ABNORMAL HIGH (ref 11–307)

## 2019-03-20 LAB — IRON AND TIBC
Iron: 33 ug/dL (ref 28–170)
Saturation Ratios: 18 % (ref 10.4–31.8)
TIBC: 182 ug/dL — ABNORMAL LOW (ref 250–450)
UIBC: 149 ug/dL

## 2019-03-20 LAB — POCT HEMOGLOBIN-HEMACUE: Hemoglobin: 11.7 g/dL — ABNORMAL LOW (ref 12.0–15.0)

## 2019-03-20 MED ORDER — EPOETIN ALFA-EPBX 10000 UNIT/ML IJ SOLN
10000.0000 [IU] | INTRAMUSCULAR | Status: DC
  Administered 2019-03-20: 15:00:00 10000 [IU] via SUBCUTANEOUS
  Filled 2019-03-20: qty 1

## 2019-03-20 NOTE — Progress Notes (Signed)
This is being addressed in phone note started 03/15/2019. Hubbard Hartshorn, RN, BSN

## 2019-03-20 NOTE — Telephone Encounter (Signed)
dme Order placed

## 2019-03-20 NOTE — Progress Notes (Unsigned)
Me

## 2019-03-20 NOTE — Telephone Encounter (Signed)
PCP has placed order for compression stockings. Call placed to Noralee Stain, RN to learn where she would like order faxed. LM with Barnetta Chapel at [facility] to have [place] return call. Hubbard Hartshorn, RN, BSN

## 2019-04-03 ENCOUNTER — Other Ambulatory Visit (HOSPITAL_COMMUNITY): Payer: Self-pay

## 2019-04-03 ENCOUNTER — Ambulatory Visit (HOSPITAL_COMMUNITY): Admission: RE | Admit: 2019-04-03 | Payer: Medicare Other | Source: Ambulatory Visit

## 2019-04-03 ENCOUNTER — Encounter (HOSPITAL_COMMUNITY): Payer: Medicare Other

## 2019-04-04 ENCOUNTER — Ambulatory Visit (HOSPITAL_COMMUNITY)
Admission: RE | Admit: 2019-04-04 | Discharge: 2019-04-04 | Disposition: A | Discharge status: Home / Self Care | Payer: Medicare Other | Source: Ambulatory Visit | Attending: Nephrology | Admitting: Nephrology

## 2019-04-04 ENCOUNTER — Ambulatory Visit (HOSPITAL_COMMUNITY)
Admission: RE | Admit: 2019-04-04 | Discharge: 2019-04-04 | Disposition: A | Discharge status: Home / Self Care | Payer: Medicare Other | Source: Ambulatory Visit | Attending: Student in an Organized Health Care Education/Training Program | Admitting: Student in an Organized Health Care Education/Training Program

## 2019-04-04 ENCOUNTER — Encounter (HOSPITAL_COMMUNITY): Payer: Medicare Other

## 2019-04-04 ENCOUNTER — Other Ambulatory Visit: Payer: Self-pay

## 2019-04-04 VITALS — BP 157/58 | HR 61 | Temp 97.3°F | Resp 20 | Ht 64.0 in

## 2019-04-04 DIAGNOSIS — N184 Chronic kidney disease, stage 4 (severe): Secondary | ICD-10-CM

## 2019-04-04 DIAGNOSIS — N183 Chronic kidney disease, stage 3 (moderate): Secondary | ICD-10-CM | POA: Diagnosis not present

## 2019-04-04 DIAGNOSIS — N189 Chronic kidney disease, unspecified: Secondary | ICD-10-CM | POA: Diagnosis not present

## 2019-04-04 DIAGNOSIS — R296 Repeated falls: Secondary | ICD-10-CM | POA: Diagnosis not present

## 2019-04-04 LAB — POCT HEMOGLOBIN-HEMACUE: Hemoglobin: 12.9 g/dL (ref 12.0–15.0)

## 2019-04-04 MED ORDER — EPOETIN ALFA-EPBX 10000 UNIT/ML IJ SOLN
10000.0000 [IU] | INTRAMUSCULAR | Status: DC
  Filled 2019-04-04: qty 1

## 2019-04-04 MED ORDER — SODIUM CHLORIDE 0.9 % IV SOLN
510.0000 mg | Freq: Once | INTRAVENOUS | Status: AC
  Administered 2019-04-04: 13:00:00 510 mg via INTRAVENOUS
  Filled 2019-04-04: qty 17

## 2019-04-05 DIAGNOSIS — N184 Chronic kidney disease, stage 4 (severe): Secondary | ICD-10-CM | POA: Diagnosis not present

## 2019-04-11 NOTE — Telephone Encounter (Signed)
Noralee Stain, RN returned call. States she has appt with patient tomorrow. If patient has not yet replaced her stockings, Mendel Ryder will make her aware that patient can stop by our office before or after her next iron infusion on 04/18/2019 and ask one of the nurses to measure her for stockings. These can be obtained through materials management at that time at no cost to the patient. Hubbard Hartshorn, RN, BSN

## 2019-04-11 NOTE — Telephone Encounter (Signed)
Spoke with Chelsea Mcdaniel at [facility]. He will ask Naida Sleight, RN to call as soon as possible. Hubbard Hartshorn, RN, BSN

## 2019-04-12 DIAGNOSIS — I129 Hypertensive chronic kidney disease with stage 1 through stage 4 chronic kidney disease, or unspecified chronic kidney disease: Secondary | ICD-10-CM | POA: Diagnosis not present

## 2019-04-12 DIAGNOSIS — N184 Chronic kidney disease, stage 4 (severe): Secondary | ICD-10-CM | POA: Diagnosis not present

## 2019-04-12 DIAGNOSIS — R600 Localized edema: Secondary | ICD-10-CM | POA: Diagnosis not present

## 2019-04-12 DIAGNOSIS — N2581 Secondary hyperparathyroidism of renal origin: Secondary | ICD-10-CM | POA: Diagnosis not present

## 2019-04-12 DIAGNOSIS — D631 Anemia in chronic kidney disease: Secondary | ICD-10-CM | POA: Diagnosis not present

## 2019-04-18 ENCOUNTER — Other Ambulatory Visit: Payer: Self-pay

## 2019-04-18 ENCOUNTER — Encounter (HOSPITAL_COMMUNITY)
Admission: RE | Admit: 2019-04-18 | Discharge: 2019-04-18 | Disposition: A | Discharge status: Home / Self Care | Payer: Medicare Other | Source: Ambulatory Visit | Attending: Nephrology | Admitting: Nephrology

## 2019-04-18 VITALS — BP 164/73 | HR 63 | Temp 96.0°F | Resp 20

## 2019-04-18 DIAGNOSIS — N184 Chronic kidney disease, stage 4 (severe): Secondary | ICD-10-CM | POA: Insufficient documentation

## 2019-04-18 LAB — POCT HEMOGLOBIN-HEMACUE: Hemoglobin: 12.1 g/dL (ref 12.0–15.0)

## 2019-04-18 LAB — IRON AND TIBC
Iron: 66 ug/dL (ref 28–170)
Saturation Ratios: 34 % — ABNORMAL HIGH (ref 10.4–31.8)
TIBC: 196 ug/dL — ABNORMAL LOW (ref 250–450)
UIBC: 130 ug/dL

## 2019-04-18 LAB — FERRITIN: Ferritin: 1005 ng/mL — ABNORMAL HIGH (ref 11–307)

## 2019-04-18 MED ORDER — EPOETIN ALFA-EPBX 10000 UNIT/ML IJ SOLN
10000.0000 [IU] | INTRAMUSCULAR | Status: DC
  Filled 2019-04-18: qty 1

## 2019-04-23 ENCOUNTER — Other Ambulatory Visit: Payer: Self-pay | Admitting: Internal Medicine

## 2019-04-24 ENCOUNTER — Other Ambulatory Visit: Payer: Self-pay | Admitting: Internal Medicine

## 2019-04-24 NOTE — Telephone Encounter (Signed)
Patient needs appointment with me

## 2019-04-30 ENCOUNTER — Telehealth: Payer: Self-pay | Admitting: *Deleted

## 2019-04-30 NOTE — Telephone Encounter (Signed)
[facility] care manager calls: Pt and family are not checking blood sugars and BP, they have the glucose monitor and BP monitor but state they are not going to do that, they do say that pt takes her medicines

## 2019-04-30 NOTE — Telephone Encounter (Signed)
Patient's home health RN should be checking bp and blood glucose

## 2019-05-02 ENCOUNTER — Other Ambulatory Visit: Payer: Self-pay

## 2019-05-02 ENCOUNTER — Ambulatory Visit (HOSPITAL_COMMUNITY)
Admission: RE | Admit: 2019-05-02 | Discharge: 2019-05-02 | Disposition: A | Discharge status: Home / Self Care | Payer: Medicare Other | Source: Ambulatory Visit | Attending: Nephrology | Admitting: Nephrology

## 2019-05-02 VITALS — BP 183/80 | HR 62 | Temp 97.0°F | Resp 20

## 2019-05-02 DIAGNOSIS — N184 Chronic kidney disease, stage 4 (severe): Secondary | ICD-10-CM | POA: Diagnosis not present

## 2019-05-02 LAB — POCT HEMOGLOBIN-HEMACUE: Hemoglobin: 11.7 g/dL — ABNORMAL LOW (ref 12.0–15.0)

## 2019-05-02 MED ORDER — EPOETIN ALFA-EPBX 10000 UNIT/ML IJ SOLN
10000.0000 [IU] | INTRAMUSCULAR | Status: DC
  Administered 2019-05-02: 10000 [IU] via SUBCUTANEOUS
  Filled 2019-05-02: qty 1

## 2019-05-04 DIAGNOSIS — N183 Chronic kidney disease, stage 3 unspecified: Secondary | ICD-10-CM | POA: Diagnosis not present

## 2019-05-04 DIAGNOSIS — R296 Repeated falls: Secondary | ICD-10-CM | POA: Diagnosis not present

## 2019-05-07 ENCOUNTER — Other Ambulatory Visit: Payer: Self-pay | Admitting: Internal Medicine

## 2019-05-09 DIAGNOSIS — H338 Other retinal detachments: Secondary | ICD-10-CM | POA: Diagnosis not present

## 2019-05-09 DIAGNOSIS — H401113 Primary open-angle glaucoma, right eye, severe stage: Secondary | ICD-10-CM | POA: Diagnosis not present

## 2019-05-09 DIAGNOSIS — H2702 Aphakia, left eye: Secondary | ICD-10-CM | POA: Diagnosis not present

## 2019-05-09 DIAGNOSIS — H2511 Age-related nuclear cataract, right eye: Secondary | ICD-10-CM | POA: Diagnosis not present

## 2019-05-09 LAB — HM DIABETES EYE EXAM

## 2019-05-14 NOTE — Progress Notes (Signed)
   CC: Hypertension follow up  HPI:  Chelsea Mcdaniel is a 85 y.o. female with hypertension, dm2, ckd4, and hld who presents for hypertension follow up. Please see problem based charting for evaluation, assessment, and plan.  Past Medical History:  Diagnosis Date  . Anemia     normocytic anemia with baseline hemoglobin 10-11  . Blindness of left eye     likely related to stroke, left cataract removed from that eye with complications  . Calculus gallbladder and bile duct with cholecystitis with obstruction 01/2015  . Chronic kidney disease 2006    left renal artery stenosis with probable hemodynamic significance, kidneys are normal in morphology without focal lesions or hydronephrosis this is based but cannot A. of the abdomen with and without contrast done on the 31st 2006  . CKD (chronic kidney disease), stage III ([place])   . CVA (cerebral vascular accident) ([place]) 1990's  . CVA (cerebrovascular accident) [facility])  October 2007    CT of the head atrophy with multiple remote insults noted but no definite acute findings  . Glaucoma   . Gout   . Hyperlipidemia   . Hypertension   . Personal history of DVT (deep vein thrombosis) 08/11/2010   BLE  . Type II diabetes mellitus ([place])    Review of Systems:    Difficult to assess as patient has some dementia  Physical Exam:  Vitals:   05/15/19 1527 05/15/19 1543  BP: (!) 160/70 (!) 170/73  Pulse: 63 63  Temp: 98.3 F (36.8 C)   SpO2: 100%   Weight: 130 lb 9.6 oz (59.2 kg)    Physical Exam  Constitutional: She appears well-developed and well-nourished. No distress.  HENT:  Head: Normocephalic and atraumatic.  Eyes: Conjunctivae are normal.  Cardiovascular: Normal rate, regular rhythm and normal heart sounds.  Respiratory: Effort normal and breath sounds normal. No respiratory distress. She has no wheezes.  GI: Soft. Bowel sounds are normal. She exhibits no distension. There is abdominal tenderness (states that she has mild discomfort  with palpation all over abdomen only when  provider presses down).  Neurological: She is alert.  Patient does not seem to have full comprehension of what physician is asking. But does answer questions appropriately.   Skin: She is not diaphoretic.  Psychiatric: She has a normal mood and affect. Her behavior is normal.      Assessment & Plan:   See Encounters Tab for problem based charting.  Patient discussed with Dr. He[place]

## 2019-05-15 ENCOUNTER — Telehealth: Payer: Self-pay

## 2019-05-15 ENCOUNTER — Other Ambulatory Visit: Payer: Self-pay

## 2019-05-15 ENCOUNTER — Ambulatory Visit (INDEPENDENT_AMBULATORY_CARE_PROVIDER_SITE_OTHER): Payer: Medicare Other | Admitting: Internal Medicine

## 2019-05-15 VITALS — BP 170/73 | HR 63 | Temp 98.3°F | Wt 130.6 lb

## 2019-05-15 DIAGNOSIS — N184 Chronic kidney disease, stage 4 (severe): Secondary | ICD-10-CM

## 2019-05-15 DIAGNOSIS — I10 Essential (primary) hypertension: Secondary | ICD-10-CM

## 2019-05-15 DIAGNOSIS — E1122 Type 2 diabetes mellitus with diabetic chronic kidney disease: Secondary | ICD-10-CM

## 2019-05-15 DIAGNOSIS — Z7984 Long term (current) use of oral hypoglycemic drugs: Secondary | ICD-10-CM | POA: Diagnosis not present

## 2019-05-15 DIAGNOSIS — I129 Hypertensive chronic kidney disease with stage 1 through stage 4 chronic kidney disease, or unspecified chronic kidney disease: Secondary | ICD-10-CM

## 2019-05-15 DIAGNOSIS — E1159 Type 2 diabetes mellitus with other circulatory complications: Secondary | ICD-10-CM

## 2019-05-15 DIAGNOSIS — Z23 Encounter for immunization: Secondary | ICD-10-CM | POA: Diagnosis not present

## 2019-05-15 DIAGNOSIS — Z0000 Encounter for general adult medical examination without abnormal findings: Secondary | ICD-10-CM

## 2019-05-15 DIAGNOSIS — Z79899 Other long term (current) drug therapy: Secondary | ICD-10-CM

## 2019-05-15 DIAGNOSIS — E785 Hyperlipidemia, unspecified: Secondary | ICD-10-CM | POA: Diagnosis not present

## 2019-05-15 LAB — POCT GLYCOSYLATED HEMOGLOBIN (HGB A1C): Hemoglobin A1C: 7.1 % — AB (ref 4.0–5.6)

## 2019-05-15 LAB — GLUCOSE, CAPILLARY: Glucose-Capillary: 241 mg/dL — ABNORMAL HIGH (ref 70–99)

## 2019-05-15 MED ORDER — FUROSEMIDE 10 MG/ML PO SOLN: ORAL | 1 refills | Status: DC

## 2019-05-15 NOTE — Telephone Encounter (Signed)
Pharmacist calls to state furosemide script needs to be changed from 83ml to 360 mls, VO given. please change medlist if you agree if not please call pharmacy and discuss with lori.

## 2019-05-15 NOTE — Patient Instructions (Signed)
It was a pleasure to see you today [name]. Please make the following changes:  -Please continue taking all the medications indicated on this form  -please follow up with your kidney doctor  -follow up with me in 3 months  -you got your flu vaccine today  If you have any questions or concerns, please call our clinic at [phone] between 9am-5pm and after hours call [phone] and ask for the internal medicine resident on call. If you feel you are having a medical emergency please call 911.   Thank you, we look forward to help you remain healthy!  [name], MD Internal Medicine PGY3

## 2019-05-15 NOTE — Telephone Encounter (Signed)
Chelsea Mcdaniel with [facility] requesting to speak with a nurse about furosemide (LASIX) 10 MG/ML solution. Please call back.

## 2019-05-16 ENCOUNTER — Ambulatory Visit (HOSPITAL_COMMUNITY)
Admission: RE | Admit: 2019-05-16 | Discharge: 2019-05-16 | Disposition: A | Discharge status: Home / Self Care | Payer: Medicare Other | Source: Ambulatory Visit | Attending: Nephrology | Admitting: Nephrology

## 2019-05-16 VITALS — BP 154/68 | HR 65 | Temp 98.2°F | Resp 18

## 2019-05-16 DIAGNOSIS — N184 Chronic kidney disease, stage 4 (severe): Secondary | ICD-10-CM | POA: Diagnosis not present

## 2019-05-16 LAB — POCT HEMOGLOBIN-HEMACUE: Hemoglobin: 11.7 g/dL — ABNORMAL LOW (ref 12.0–15.0)

## 2019-05-16 LAB — IRON AND TIBC
Iron: 57 ug/dL (ref 28–170)
Saturation Ratios: 33 % — ABNORMAL HIGH (ref 10.4–31.8)
TIBC: 175 ug/dL — ABNORMAL LOW (ref 250–450)
UIBC: 118 ug/dL

## 2019-05-16 LAB — FERRITIN: Ferritin: 820 ng/mL — ABNORMAL HIGH (ref 11–307)

## 2019-05-16 MED ORDER — FUROSEMIDE 10 MG/ML PO SOLN: ORAL | 1 refills | Status: DC

## 2019-05-16 MED ORDER — EPOETIN ALFA-EPBX 10000 UNIT/ML IJ SOLN
10000.0000 [IU] | INTRAMUSCULAR | Status: DC
  Administered 2019-05-16: 10000 [IU] via SUBCUTANEOUS
  Filled 2019-05-16: qty 1

## 2019-05-16 NOTE — Assessment & Plan Note (Signed)
Patient was seen during a visit in [facility] in July 2020. At that time the patient's cr was recorded to be 3.15 which was elevated from 1.66.  The patient has been following with Dr. Carolin Sicks (nephrology). She may need dialysis and this is going to be discussed at next visit in november.

## 2019-05-16 NOTE — Assessment & Plan Note (Signed)
The patient's blood pressure during this visit was 160/70 and repeat was 170/73. The patient is currently taking amlodipine 10mg  qd, lasix 80mg  qd prn, clonidine 0.3mg  q7days. Her last blood pressure visits are   BP Readings from Last 3 Encounters:  05/16/19 (!) 154/68  05/15/19 (!) 170/73  05/02/19 (!) 183/80  Assessment and Plan The patient's blood pressure is still not at goal, but is relatively better than it was before. The patient was previously also on hydralazine, but this was continued at prior visits.   The patient's family does not want to add on another antihypertensive medication. With the patient's kidney function also worsening, she may need to be started on dialysis which should help with blood pressure control.

## 2019-05-16 NOTE — Assessment & Plan Note (Signed)
The patient's last a1c=6.7 in December 2019 and during today's visit is 7.1. The patient is currently taking [place] 25mg  qd. The patient is compliant with medication. The patient has gained 5lbs over the past 1-2 months.   Assessment and plan  The patient is at goal and will continue current regimen.

## 2019-05-16 NOTE — Assessment & Plan Note (Signed)
The patient got influenza vaccination during this encounter

## 2019-05-18 NOTE — Telephone Encounter (Signed)
Family has already purchased compression hose for patient. She was wearing them at her OV with PCP on 05/15/2019. Hubbard Hartshorn, BSN, RN-BC

## 2019-05-20 NOTE — Telephone Encounter (Signed)
It has already been updated. Thanks!

## 2019-05-20 NOTE — Progress Notes (Signed)
Internal Medicine Clinic Attending  Case discussed with Dr. Chundi at the time of the visit.  We reviewed the resident's history and exam and pertinent patient test results.  I agree with the assessment, diagnosis, and plan of care documented in the resident's note. 

## 2019-05-21 ENCOUNTER — Telehealth: Payer: Self-pay | Admitting: Internal Medicine

## 2019-05-21 DIAGNOSIS — I10 Essential (primary) hypertension: Secondary | ICD-10-CM

## 2019-05-21 DIAGNOSIS — E1159 Type 2 diabetes mellitus with other circulatory complications: Secondary | ICD-10-CM

## 2019-05-21 DIAGNOSIS — M1A3710 Chronic gout due to renal impairment, right ankle and foot, without tophus (tophi): Secondary | ICD-10-CM

## 2019-05-22 NOTE — Telephone Encounter (Signed)
Based on refill history, she is nonadherent to Januvia and allopurinol.  I cannot refill warfarin because she last had an INR in April.  I see no note from the pharmacy about warfarin management.  Her problem list does indicate she needs to be on this lifelong but she needs an appointment with the warfarin clinic before I can refill this.  She needs a PCP appointment at the end of January.

## 2019-05-25 NOTE — Telephone Encounter (Signed)
Just spoke with patient's daughter in law, Cathie Olden, and she agreed to bring the patient down to the clinic on 05-30-2019 at 1:45 pm to have her INR checked after leaving her Medical Day appt.  Patient is scheduled for 08/30/2019 with Dr. Maricela Bo, because she is not in clinic in January.  Forwarding back to Dr. Lynnae January.

## 2019-05-25 NOTE — Telephone Encounter (Signed)
Thank you :)

## 2019-05-30 ENCOUNTER — Other Ambulatory Visit: Payer: Self-pay

## 2019-05-30 ENCOUNTER — Ambulatory Visit (HOSPITAL_COMMUNITY)
Admission: RE | Admit: 2019-05-30 | Discharge: 2019-05-30 | Disposition: A | Discharge status: Home / Self Care | Payer: Medicare Other | Source: Ambulatory Visit | Attending: Nephrology | Admitting: Nephrology

## 2019-05-30 ENCOUNTER — Ambulatory Visit (INDEPENDENT_AMBULATORY_CARE_PROVIDER_SITE_OTHER): Payer: Medicare Other | Admitting: Pharmacist

## 2019-05-30 VITALS — BP 173/68 | HR 61 | Resp 20

## 2019-05-30 DIAGNOSIS — Z86718 Personal history of other venous thrombosis and embolism: Secondary | ICD-10-CM | POA: Diagnosis not present

## 2019-05-30 DIAGNOSIS — N184 Chronic kidney disease, stage 4 (severe): Secondary | ICD-10-CM | POA: Insufficient documentation

## 2019-05-30 DIAGNOSIS — Z5181 Encounter for therapeutic drug level monitoring: Secondary | ICD-10-CM

## 2019-05-30 DIAGNOSIS — Z7901 Long term (current) use of anticoagulants: Secondary | ICD-10-CM

## 2019-05-30 LAB — POCT INR: INR: 1.3 — AB (ref 2.0–3.0)

## 2019-05-30 MED ORDER — EPOETIN ALFA-EPBX 10000 UNIT/ML IJ SOLN
10000.0000 [IU] | INTRAMUSCULAR | Status: DC
  Filled 2019-05-30: qty 1

## 2019-05-30 MED ORDER — WARFARIN SODIUM 5 MG PO TABS: ORAL_TABLET | ORAL | 1 refills | Status: DC

## 2019-05-30 NOTE — Patient Instructions (Signed)
Patient instructed to take medications as defined in the Anti-coagulation Track section of this encounter.  Patient instructed to take today's dose.  Patient instructed to take  one and one-half tablet of your 5mg  peach-colored warfarin tablet by mouth daily at 6PM--EXCEPT on Sundays, take only one (1) tablet on Sundays.  Patient verbalized understanding of these instructions.

## 2019-05-30 NOTE — Progress Notes (Signed)
Anticoagulation Management Chelsea Mcdaniel is a 85 y.o. female who reports to the clinic for monitoring of warfarin treatment.    Indication: DVT, History of (resolved); Long term current use of anticoagulation.   * Duration: indefinite Supervising physician: Aldine Contes  Anticoagulation Clinic Visit History: Patient does not report signs/symptoms of bleeding or thromboembolism  Other recent changes: No diet, medications, lifestyle changes.  Anticoagulation Episode Summary    Current INR goal:  2.0-3.0  TTR:  65.4 % (5.3 y)  Next INR check:  06/11/2019  INR from last check:  1.3 (05/30/2019)  Weekly max warfarin dose:    Target end date:    INR check location:    Preferred lab:    Send INR reminders to:     Indications   EMBOLISM/THROMBOSIS DEEP VSL LWR EXTRM NOS (Resolved) [I82.409] Long-term (current) use of anticoagulants [Z79.01]       Comments:          Allergies  Allergen Reactions  . Diltiazem Other (See Comments)    Symptomatic bradaycardia  . Metoprolol Other (See Comments)    Symptomatic bradycardia    Current Outpatient Medications:  .  Accu-Chek Softclix Lancets lancets, Use to check blood sugar 3 times daily. DIAG CODE E11.9, Disp: 300 each, Rfl: 1 .  allopurinol (ZYLOPRIM) 100 MG tablet, TAKE 1/2 TABLET BY MOUTH EVERY OTHER DAY, Disp: 45 tablet, Rfl: 1 .  amLODipine (NORVASC) 10 MG tablet, TAKE 1 TABLET BY MOUTH EVERY DAY, Disp: 90 tablet, Rfl: 1 .  Blood Glucose Monitoring Suppl (ACCU-CHEK AVIVA PLUS) w/Device KIT, Check blood sugar 1 time a day, Disp: 1 kit, Rfl: 0 .  cloNIDine (CATAPRES - DOSED IN MG/24 HR) 0.3 mg/24hr patch, APPLY 1 PATCH TO SKIN EVERY 7 DAYS, Disp: 12 patch, Rfl: 1 .  dorzolamide-timolol (COSOPT) 22.3-6.8 MG/ML ophthalmic solution, Place 1 drop into both eyes 2 (two) times daily., Disp: , Rfl: 3 .  furosemide (LASIX) 10 MG/ML solution, Take '80mg'$  qam and '40mg'$  qpm, Disp: 120 mL, Rfl: 1 .  GAVILAX 17 GM/SCOOP powder, Take 17 grams  by mouth 2 times daily, Disp: 850 g, Rfl: 2 .  glucose blood (ACCU-CHEK AVIVA PLUS) test strip, USE 1 TIME DAILY TO CHECK BLOOD SUGAR, Disp: 100 each, Rfl: 3 .  JANUVIA 25 MG tablet, TAKE 1 TABLET BY MOUTH EVERY DAY, Disp: 90 tablet, Rfl: 1 .  Lancets Misc. (ACCU-CHEK SOFTCLIX LANCET DEV) KIT, Use to check blood sugar one time a day, Disp: 1 kit, Rfl: 2 .  lidocaine (LIDODERM) 5 %, Place 1 patch onto the skin daily. Remove & Discard patch within 12 hours or as directed by MD, Disp: 30 patch, Rfl: 0 .  LUMIGAN 0.01 % SOLN, Place 1 drop into both eyes at bedtime. , Disp: , Rfl:  .  polyethylene glycol (MIRALAX) packet, Take 17 g by mouth 2 (two) times daily., Disp: 180 packet, Rfl: 1 .  warfarin (COUMADIN) 5 MG tablet, Take one-and-one-half (1&1/2) tablets by mouth once-daily at 6PM--Except on Sundays, take only one (1) tablet., Disp: 40 tablet, Rfl: 1 .  glucose blood (ACCU-CHEK AVIVA) test strip, Use as instructed, Disp: 100 each, Rfl: 12 No current facility-administered medications for this visit.   Facility-Administered Medications Ordered in Other Visits:  .  epoetin alfa-epbx (RETACRIT) injection 10,000 Units, 10,000 Units, Subcutaneous, Q14 Days, Rosita Fire, MD Past Medical History:  Diagnosis Date  . Anemia     normocytic anemia with baseline hemoglobin 10-11  . Blindness of left  eye     likely related to stroke, left cataract removed from that eye with complications  . Calculus gallbladder and bile duct with cholecystitis with obstruction 01/2015  . Chronic kidney disease 2006    left renal artery stenosis with probable hemodynamic significance, kidneys are normal in morphology without focal lesions or hydronephrosis this is based but cannot A. of the abdomen with and without contrast done on the 31st 2006  . CKD (chronic kidney disease), stage III ([place])   . CVA (cerebral vascular accident) ([place]) 1990's  . CVA (cerebrovascular accident) [facility])  October 2007    CT of the head  atrophy with multiple remote insults noted but no definite acute findings  . Glaucoma   . Gout   . Hyperlipidemia   . Hypertension   . Personal history of DVT (deep vein thrombosis) 08/11/2010   BLE  . Type II diabetes mellitus ([place])    Social History   Socioeconomic History  . Marital status: Widowed    Spouse name: Not on file  . Number of children: Not on file  . Years of education: Not on file  . Highest education level: Not on file  Occupational History  . Not on file  Social Needs  . Financial resource strain: Not on file  . Food insecurity    Worry: Not on file    Inability: Not on file  . Transportation needs    Medical: Not on file    Non-medical: Not on file  Tobacco Use  . Smoking status: Never Smoker  . Smokeless tobacco: Never Used  Substance and Sexual Activity  . Alcohol use: No    Alcohol/week: 0.0 standard drinks  . Drug use: No  . Sexual activity: Not Currently  Lifestyle  . Physical activity    Days per week: Not on file    Minutes per session: Not on file  . Stress: Not on file  Relationships  . Social Herbalist on phone: Not on file    Gets together: Not on file    Attends religious service: Not on file    Active member of club or organization: Not on file    Attends meetings of clubs or organizations: Not on file    Relationship status: Not on file  Other Topics Concern  . Not on file  Social History Narrative    Patient is a widow. She has 11 children 5 of who are living. She is retired in 1993 from [employer]. She denies tobacco alcohol or drug use.   Family History  Problem Relation Age of Onset  . Heart disease Mother   . Diabetes Mother   . Hyperlipidemia Mother   . Hypertension Mother   . Heart disease Father   . Diabetes Father   . Hyperlipidemia Father   . Hypertension Father     ASSESSMENT Recent Results: The most recent result is correlated with 45 mg per week: Lab Results  Component Value Date   INR  1.3 (A) 05/30/2019   INR 1.9 (A) 11/14/2018   INR 2.0 10/02/2018    Anticoagulation Dosing: Description   Take one and one-half tablet of your '5mg'$  peach-colored warfarin tablet by mouth daily at 6PM--EXCEPT on Sundays, take only one (1) tablet on Sundays.      INR today: Subtherapeutic  PLAN Weekly dose was increased by 11% to 50 mg per week  Patient Instructions  Patient instructed to take medications as defined in the Anti-coagulation Track  section of this encounter.  Patient instructed to take today's dose.  Patient instructed to take  one and one-half tablet of your '5mg'$  peach-colored warfarin tablet by mouth daily at 6PM--EXCEPT on Sundays, take only one (1) tablet on Sundays.  Patient verbalized understanding of these instructions.    Patient advised to contact clinic or seek medical attention if signs/symptoms of bleeding or thromboembolism occur.  Patient verbalized understanding by repeating back information and was advised to contact me if further medication-related questions arise. Patient was also provided an information handout.  Follow-up No follow-ups on file.  Arnold Long, CPP  15 minutes spent face-to-face with the patient during the encounter. 50% of time spent on education, including signs/sx bleeding and clotting, as well as food and drug interactions with warfarin. 50% of time was spent on fingerprick POC INR sample collection,processing, results determination, and documentation in [email or web address]

## 2019-05-31 LAB — POCT HEMOGLOBIN-HEMACUE: Hemoglobin: 12.2 g/dL (ref 12.0–15.0)

## 2019-06-04 DIAGNOSIS — N183 Chronic kidney disease, stage 3 unspecified: Secondary | ICD-10-CM | POA: Diagnosis not present

## 2019-06-04 DIAGNOSIS — R296 Repeated falls: Secondary | ICD-10-CM | POA: Diagnosis not present

## 2019-06-11 ENCOUNTER — Ambulatory Visit (INDEPENDENT_AMBULATORY_CARE_PROVIDER_SITE_OTHER): Payer: Medicare Other | Admitting: Pharmacist

## 2019-06-11 ENCOUNTER — Other Ambulatory Visit: Payer: Self-pay

## 2019-06-11 DIAGNOSIS — Z7901 Long term (current) use of anticoagulants: Secondary | ICD-10-CM

## 2019-06-11 DIAGNOSIS — Z86718 Personal history of other venous thrombosis and embolism: Secondary | ICD-10-CM | POA: Diagnosis not present

## 2019-06-11 DIAGNOSIS — Z5181 Encounter for therapeutic drug level monitoring: Secondary | ICD-10-CM | POA: Diagnosis not present

## 2019-06-11 LAB — POCT INR: INR: 2.9 (ref 2.0–3.0)

## 2019-06-11 NOTE — Patient Instructions (Signed)
Patient instructed to take medications as defined in the Anti-coagulation Track section of this encounter.  Patient instructed to take today's dose.  Patient instructed to take one and one-half tablet of your 5mg  peach-colored warfarin tablet by mouth on Mondays, Wednesdays and Fridays. All OTHER days--take 1 and 1/2 of your 5mg  peach-colored warfarin tablets. Patient verbalized understanding of these instructions.

## 2019-06-11 NOTE — Progress Notes (Addendum)
Anticoagulation Management Chelsea Mcdaniel is a 85 y.o. female who reports to the clinic for monitoring of warfarin treatment.    Indication: DVT , history of (Resolved); Long term current use of anticoagulant.   Duration: indefinite Supervising physician: Aldine Contes  Anticoagulation Clinic Visit History: Patient does not report signs/symptoms of bleeding or thromboembolism  Other recent changes: No diet, medications, lifestyle changes endorsed.  Anticoagulation Episode Summary    Current INR goal:  2.0-3.0  TTR:  65.3 % (5.3 y)  Next INR check:  07/02/2019  INR from last check:  2.9 (06/11/2019)  Weekly max warfarin dose:    Target end date:    INR check location:    Preferred lab:    Send INR reminders to:     Indications   EMBOLISM/THROMBOSIS DEEP VSL LWR EXTRM NOS (Resolved) [I82.409] Long-term (current) use of anticoagulants [Z79.01]       Comments:          Allergies  Allergen Reactions  . Diltiazem Other (See Comments)    Symptomatic bradaycardia  . Metoprolol Other (See Comments)    Symptomatic bradycardia    Current Outpatient Medications:  .  Accu-Chek Softclix Lancets lancets, Use to check blood sugar 3 times daily. DIAG CODE E11.9, Disp: 300 each, Rfl: 1 .  allopurinol (ZYLOPRIM) 100 MG tablet, TAKE 1/2 TABLET BY MOUTH EVERY OTHER DAY, Disp: 45 tablet, Rfl: 1 .  amLODipine (NORVASC) 10 MG tablet, TAKE 1 TABLET BY MOUTH EVERY DAY, Disp: 90 tablet, Rfl: 1 .  Blood Glucose Monitoring Suppl (ACCU-CHEK AVIVA PLUS) w/Device KIT, Check blood sugar 1 time a day, Disp: 1 kit, Rfl: 0 .  cloNIDine (CATAPRES - DOSED IN MG/24 HR) 0.3 mg/24hr patch, APPLY 1 PATCH TO SKIN EVERY 7 DAYS, Disp: 12 patch, Rfl: 1 .  dorzolamide-timolol (COSOPT) 22.3-6.8 MG/ML ophthalmic solution, Place 1 drop into both eyes 2 (two) times daily., Disp: , Rfl: 3 .  furosemide (LASIX) 10 MG/ML solution, Take 64m qam and 439mqpm, Disp: 120 mL, Rfl: 1 .  GAVILAX 17 GM/SCOOP powder, Take 17  grams by mouth 2 times daily, Disp: 850 g, Rfl: 2 .  glucose blood (ACCU-CHEK AVIVA PLUS) test strip, USE 1 TIME DAILY TO CHECK BLOOD SUGAR, Disp: 100 each, Rfl: 3 .  JANUVIA 25 MG tablet, TAKE 1 TABLET BY MOUTH EVERY DAY, Disp: 90 tablet, Rfl: 1 .  Lancets Misc. (ACCU-CHEK SOFTCLIX LANCET DEV) KIT, Use to check blood sugar one time a day, Disp: 1 kit, Rfl: 2 .  lidocaine (LIDODERM) 5 %, Place 1 patch onto the skin daily. Remove & Discard patch within 12 hours or as directed by MD, Disp: 30 patch, Rfl: 0 .  LUMIGAN 0.01 % SOLN, Place 1 drop into both eyes at bedtime. , Disp: , Rfl:  .  polyethylene glycol (MIRALAX) packet, Take 17 g by mouth 2 (two) times daily., Disp: 180 packet, Rfl: 1 .  warfarin (COUMADIN) 5 MG tablet, Take one-and-one-half (1&1/2) tablets by mouth once-daily at 6PM--Except on Sundays, take only one (1) tablet., Disp: 40 tablet, Rfl: 1 .  glucose blood (ACCU-CHEK AVIVA) test strip, Use as instructed, Disp: 100 each, Rfl: 12 Past Medical History:  Diagnosis Date  . Anemia     normocytic anemia with baseline hemoglobin 10-11  . Blindness of left eye     likely related to stroke, left cataract removed from that eye with complications  . Calculus gallbladder and bile duct with cholecystitis with obstruction 01/2015  . Chronic  kidney disease 2006    left renal artery stenosis with probable hemodynamic significance, kidneys are normal in morphology without focal lesions or hydronephrosis this is based but cannot A. of the abdomen with and without contrast done on the 31st 2006  . CKD (chronic kidney disease), stage III ([place])   . CVA (cerebral vascular accident) ([place]) 1990's  . CVA (cerebrovascular accident) [facility])  October 2007    CT of the head atrophy with multiple remote insults noted but no definite acute findings  . Glaucoma   . Gout   . Hyperlipidemia   . Hypertension   . Personal history of DVT (deep vein thrombosis) 08/11/2010   BLE  . Type II diabetes mellitus ([place])     Social History   Socioeconomic History  . Marital status: Widowed    Spouse name: Not on file  . Number of children: Not on file  . Years of education: Not on file  . Highest education level: Not on file  Occupational History  . Not on file  Social Needs  . Financial resource strain: Not on file  . Food insecurity    Worry: Not on file    Inability: Not on file  . Transportation needs    Medical: Not on file    Non-medical: Not on file  Tobacco Use  . Smoking status: Never Smoker  . Smokeless tobacco: Never Used  Substance and Sexual Activity  . Alcohol use: No    Alcohol/week: 0.0 standard drinks  . Drug use: No  . Sexual activity: Not Currently  Lifestyle  . Physical activity    Days per week: Not on file    Minutes per session: Not on file  . Stress: Not on file  Relationships  . Social Herbalist on phone: Not on file    Gets together: Not on file    Attends religious service: Not on file    Active member of club or organization: Not on file    Attends meetings of clubs or organizations: Not on file    Relationship status: Not on file  Other Topics Concern  . Not on file  Social History Narrative    Patient is a widow. She has 11 children 5 of who are living. She is retired in 1993 from [employer]. She denies tobacco alcohol or drug use.   Family History  Problem Relation Age of Onset  . Heart disease Mother   . Diabetes Mother   . Hyperlipidemia Mother   . Hypertension Mother   . Heart disease Father   . Diabetes Father   . Hyperlipidemia Father   . Hypertension Father     ASSESSMENT Recent Results: The most recent result is correlated with 50 mg per week: Lab Results  Component Value Date   INR 2.9 06/11/2019   INR 1.3 (A) 05/30/2019   INR 1.9 (A) 11/14/2018    Anticoagulation Dosing: Description   Take one and one-half tablet of your 46m peach-colored warfarin tablet by mouth on Mondays, Wednesdays and Fridays. All OTHER  days--take 1 and 1/2 of your 558mpeach-colored warfarin tablets.      INR today: Therapeutic  PLAN Weekly dose was DECREASED by 10% to 45 mg per week  Patient Instructions  Patient instructed to take medications as defined in the Anti-coagulation Track section of this encounter.  Patient instructed to take today's dose.  Patient instructed to take one and one-half tablet of your 51m72meach-colored warfarin tablet by  mouth on Mondays, Wednesdays and Fridays. All OTHER days--take 1 and 1/2 of your 74m peach-colored warfarin tablets. Patient verbalized understanding of these instructions.    Patient advised to contact clinic or seek medical attention if signs/symptoms of bleeding or thromboembolism occur.  Patient verbalized understanding by repeating back information and was advised to contact me if further medication-related questions arise. Patient was also provided an information handout.  Follow-up Return in 3 weeks (on 07/02/2019) for Follow up INR.  JPennie Banter PharmD, CPP  15 minutes spent face-to-face with the patient during the encounter. 50% of time spent on education, including signs/sx bleeding and clotting, as well as food and drug interactions with warfarin. 50% of time was spent on fingerprick POC INR sample collection,processing, results determination, and documentation in [email or web address]

## 2019-06-13 ENCOUNTER — Other Ambulatory Visit: Payer: Self-pay

## 2019-06-13 ENCOUNTER — Ambulatory Visit (HOSPITAL_COMMUNITY)
Admission: RE | Admit: 2019-06-13 | Discharge: 2019-06-13 | Disposition: A | Discharge status: Home / Self Care | Payer: Medicare Other | Source: Ambulatory Visit | Attending: Nephrology | Admitting: Nephrology

## 2019-06-13 VITALS — BP 172/83 | HR 73 | Temp 97.0°F | Resp 20

## 2019-06-13 DIAGNOSIS — N184 Chronic kidney disease, stage 4 (severe): Secondary | ICD-10-CM | POA: Insufficient documentation

## 2019-06-13 LAB — POCT HEMOGLOBIN-HEMACUE: Hemoglobin: 12 g/dL (ref 12.0–15.0)

## 2019-06-13 LAB — IRON AND TIBC
Iron: 46 ug/dL (ref 28–170)
Saturation Ratios: 26 % (ref 10.4–31.8)
TIBC: 176 ug/dL — ABNORMAL LOW (ref 250–450)
UIBC: 130 ug/dL

## 2019-06-13 LAB — FERRITIN: Ferritin: 1051 ng/mL — ABNORMAL HIGH (ref 11–307)

## 2019-06-13 MED ORDER — EPOETIN ALFA-EPBX 10000 UNIT/ML IJ SOLN
10000.0000 [IU] | INTRAMUSCULAR | Status: DC
  Filled 2019-06-13: qty 1

## 2019-06-13 NOTE — Progress Notes (Signed)
INTERNAL MEDICINE TEACHING ATTENDING ADDENDUM - Lean Jaeger M.D  Duration- indefinite, Indication- recurrent CVAs, DVT, INR- therapeutic. Agree with pharmacy recommendations to decrease coumadin weekly dose by 10%

## 2019-06-14 ENCOUNTER — Other Ambulatory Visit: Payer: Medicare Other | Admitting: Hospice

## 2019-06-14 ENCOUNTER — Other Ambulatory Visit: Payer: Self-pay

## 2019-06-14 DIAGNOSIS — F0150 Vascular dementia without behavioral disturbance: Secondary | ICD-10-CM

## 2019-06-14 DIAGNOSIS — Z515 Encounter for palliative care: Secondary | ICD-10-CM

## 2019-06-14 NOTE — Progress Notes (Signed)
[facility] Consult Note Telephone: [phone]  Fax: [phone]  PATIENT NAME: Chelsea Mcdaniel DOB: 1927/09/30 MRN: [id number]  PRIMARY CARE PROVIDER:   [name], MD  REFERRING PROVIDER: Glory Buff, NP  RESPONSIBLE PARTY:   Brytani Voth - daughter in law [phone]  TELEHEALTH VISIT STATEMENT Due to the COVID-19 crisis, this visit was done via telephone from my office. It was initiated and consented to by this patient and/or family.  RECOMMENDATIONS/PLAN:   Advance Care Planning/Goals of Care: Telehealth Visit consisted of building trust and discussions on Palliative Medicine as specialized medical care for people living with serious illness, aimed at facilitating advance care plan, symptoms relief and establishing goals of care. Goals of care include to maximize quality of life and symptom management. Patient is a DNR, a copy at home; signed a copy and uploaded in [place]. Discussed MOST form with [name]; to follow up next visit Symptom management: Ongoing memory loss r/t Dementia, FAST 6c. Patient is incontinent of bladder, ambulatory with a rolling walker, needs assistance with ADLs, feeds self after set up. She has a nursing aid at home two and half hours every day while family members augment care the balance of the time.  Patient is on anticoagulant Warfarin and attenss Coumadin clinic for check/management. INR 06/11/19 is 2.9. Type 2 DM currently managed with Januvia, last A1c 05/16/19 is 7.1. Patient denied pain/discomfort. [place] with no concerns/complaint.  Follow up: Palliative care will continue to follow patient for goals of care clarification and symptom management  I spent 60 minutes providing this consultation,  from 1pm to 2pm. More than 50% of the time in this consultation was spent coordinating communication.   HISTORY OF PRESENT ILLNESS:  Chelsea Mcdaniel is a 85 y.o. year old female with multiple  medical problems including Dementia, CKD III, Type 2 DM. Palliative Care was asked to help address goals of care.   CODE STATUS: DNR  PPS: 50% HOSPICE ELIGIBILITY/DIAGNOSIS: TBD  PAST MEDICAL HISTORY:  Past Medical History:  Diagnosis Date  . Anemia     normocytic anemia with baseline hemoglobin 10-11  . Blindness of left eye     likely related to stroke, left cataract removed from that eye with complications  . Calculus gallbladder and bile duct with cholecystitis with obstruction 01/2015  . Chronic kidney disease 2006    left renal artery stenosis with probable hemodynamic significance, kidneys are normal in morphology without focal lesions or hydronephrosis this is based but cannot A. of the abdomen with and without contrast done on the 31st 2006  . CKD (chronic kidney disease), stage III ([place])   . CVA (cerebral vascular accident) ([place]) 1990's  . CVA (cerebrovascular accident) [facility])  October 2007    CT of the head atrophy with multiple remote insults noted but no definite acute findings  . Glaucoma   . Gout   . Hyperlipidemia   . Hypertension   . Personal history of DVT (deep vein thrombosis) 08/11/2010   BLE  . Type II diabetes mellitus ([place])     SOCIAL HX:  Social History   Tobacco Use  . Smoking status: Never Smoker  . Smokeless tobacco: Never Used  Substance Use Topics  . Alcohol use: No    Alcohol/week: 0.0 standard drinks    ALLERGIES:  Allergies  Allergen Reactions  . Diltiazem Other (See Comments)    Symptomatic bradaycardia  . Metoprolol Other (See Comments)    Symptomatic bradycardia  PERTINENT MEDICATIONS:  Outpatient Encounter Medications as of 06/14/2019  Medication Sig  . Accu-Chek Softclix Lancets lancets Use to check blood sugar 3 times daily. DIAG CODE E11.9  . allopurinol (ZYLOPRIM) 100 MG tablet TAKE 1/2 TABLET BY MOUTH EVERY OTHER DAY  . amLODipine (NORVASC) 10 MG tablet TAKE 1 TABLET BY MOUTH EVERY DAY  . Blood Glucose Monitoring Suppl  (ACCU-CHEK AVIVA PLUS) w/Device KIT Check blood sugar 1 time a day  . cloNIDine (CATAPRES - DOSED IN MG/24 HR) 0.3 mg/24hr patch APPLY 1 PATCH TO SKIN EVERY 7 DAYS  . dorzolamide-timolol (COSOPT) 22.3-6.8 MG/ML ophthalmic solution Place 1 drop into both eyes 2 (two) times daily.  . furosemide (LASIX) 10 MG/ML solution Take 56m qam and 441mqpm  . GAVILAX 17 GM/SCOOP powder Take 17 grams by mouth 2 times daily  . glucose blood (ACCU-CHEK AVIVA PLUS) test strip USE 1 TIME DAILY TO CHECK BLOOD SUGAR  . glucose blood (ACCU-CHEK AVIVA) test strip Use as instructed  . JANUVIA 25 MG tablet TAKE 1 TABLET BY MOUTH EVERY DAY  . Lancets Misc. (ACCU-CHEK SOFTCLIX LANCET DEV) KIT Use to check blood sugar one time a day  . lidocaine (LIDODERM) 5 % Place 1 patch onto the skin daily. Remove & Discard patch within 12 hours or as directed by MD  . LUMIGAN 0.01 % SOLN Place 1 drop into both eyes at bedtime.   . polyethylene glycol (MIRALAX) packet Take 17 g by mouth 2 (two) times daily.  . Marland Kitchenarfarin (COUMADIN) 5 MG tablet Take one-and-one-half (1&1/2) tablets by mouth once-daily at 6PM--Except on Sundays, take only one (1) tablet.   No facility-administered encounter medications on file as of 06/14/2019.      LiTeodoro SprayNP

## 2019-06-19 ENCOUNTER — Telehealth: Payer: Self-pay | Admitting: *Deleted

## 2019-06-19 NOTE — Telephone Encounter (Signed)
I have called the pharmacy as requested. They will modify as necessary to get the 30 day supply instead of the 28 day supply. Thank you!

## 2019-06-19 NOTE — Telephone Encounter (Signed)
Pharm calls and ask if they can do 30 day supply of coumadin instead of 28 day supply, they do pill packs for pt. Dr Elie Confer, can [facility] and discuss how the packs can be done to meet pt need for coumadin and the pill packs?  [phone]

## 2019-06-27 ENCOUNTER — Ambulatory Visit (HOSPITAL_COMMUNITY)
Admission: RE | Admit: 2019-06-27 | Discharge: 2019-06-27 | Disposition: A | Discharge status: Home / Self Care | Payer: Medicare Other | Source: Ambulatory Visit | Attending: Nephrology | Admitting: Nephrology

## 2019-06-27 ENCOUNTER — Other Ambulatory Visit: Payer: Self-pay

## 2019-06-27 VITALS — BP 167/76 | HR 61 | Temp 97.8°F | Resp 14

## 2019-06-27 DIAGNOSIS — N184 Chronic kidney disease, stage 4 (severe): Secondary | ICD-10-CM | POA: Diagnosis not present

## 2019-06-27 LAB — POCT HEMOGLOBIN-HEMACUE: Hemoglobin: 11.4 g/dL — ABNORMAL LOW (ref 12.0–15.0)

## 2019-06-27 MED ORDER — EPOETIN ALFA-EPBX 10000 UNIT/ML IJ SOLN
10000.0000 [IU] | INTRAMUSCULAR | Status: DC
  Administered 2019-06-27: 10000 [IU] via SUBCUTANEOUS
  Filled 2019-06-27: qty 1

## 2019-07-02 ENCOUNTER — Ambulatory Visit (INDEPENDENT_AMBULATORY_CARE_PROVIDER_SITE_OTHER): Payer: Medicare Other | Admitting: Pharmacist

## 2019-07-02 ENCOUNTER — Other Ambulatory Visit: Payer: Self-pay

## 2019-07-02 DIAGNOSIS — Z5181 Encounter for therapeutic drug level monitoring: Secondary | ICD-10-CM

## 2019-07-02 DIAGNOSIS — Z86718 Personal history of other venous thrombosis and embolism: Secondary | ICD-10-CM

## 2019-07-02 DIAGNOSIS — Z7901 Long term (current) use of anticoagulants: Secondary | ICD-10-CM | POA: Diagnosis not present

## 2019-07-02 LAB — POCT INR: INR: 2.9 (ref 2.0–3.0)

## 2019-07-02 NOTE — Progress Notes (Signed)
Anticoagulation Management Chelsea Mcdaniel is a 85 y.o. female who reports to the clinic for monitoring of warfarin treatment.    Indication: DVT, history of (resolved); Long term current use of anticoagulant.   Duration: indefinite Supervising physician: Joni Reining  Anticoagulation Clinic Visit History: Patient does not report signs/symptoms of bleeding or thromboembolism  Other recent changes: No diet, medications, lifestyle changes endorsed by the patient at this visit.  Anticoagulation Episode Summary    Current INR goal:  2.0-3.0  TTR:  65.7 % (5.4 y)  Next INR check:  07/30/2019  INR from last check:  2.9 (07/02/2019)  Weekly max warfarin dose:    Target end date:    INR check location:    Preferred lab:    Send INR reminders to:     Indications   EMBOLISM/THROMBOSIS DEEP VSL LWR EXTRM NOS (Resolved) [I82.409] Long-term (current) use of anticoagulants [Z79.01]       Comments:          Allergies  Allergen Reactions  . Diltiazem Other (See Comments)    Symptomatic bradaycardia  . Metoprolol Other (See Comments)    Symptomatic bradycardia    Current Outpatient Medications:  .  Accu-Chek Softclix Lancets lancets, Use to check blood sugar 3 times daily. DIAG CODE E11.9, Disp: 300 each, Rfl: 1 .  allopurinol (ZYLOPRIM) 100 MG tablet, TAKE 1/2 TABLET BY MOUTH EVERY OTHER DAY, Disp: 45 tablet, Rfl: 1 .  amLODipine (NORVASC) 10 MG tablet, TAKE 1 TABLET BY MOUTH EVERY DAY, Disp: 90 tablet, Rfl: 1 .  Blood Glucose Monitoring Suppl (ACCU-CHEK AVIVA PLUS) w/Device KIT, Check blood sugar 1 time a day, Disp: 1 kit, Rfl: 0 .  cloNIDine (CATAPRES - DOSED IN MG/24 HR) 0.3 mg/24hr patch, APPLY 1 PATCH TO SKIN EVERY 7 DAYS, Disp: 12 patch, Rfl: 1 .  dorzolamide-timolol (COSOPT) 22.3-6.8 MG/ML ophthalmic solution, Place 1 drop into both eyes 2 (two) times daily., Disp: , Rfl: 3 .  furosemide (LASIX) 10 MG/ML solution, Take 19m qam and 433mqpm, Disp: 120 mL, Rfl: 1 .  GAVILAX 17  GM/SCOOP powder, Take 17 grams by mouth 2 times daily, Disp: 850 g, Rfl: 2 .  glucose blood (ACCU-CHEK AVIVA PLUS) test strip, USE 1 TIME DAILY TO CHECK BLOOD SUGAR, Disp: 100 each, Rfl: 3 .  JANUVIA 25 MG tablet, TAKE 1 TABLET BY MOUTH EVERY DAY, Disp: 90 tablet, Rfl: 1 .  Lancets Misc. (ACCU-CHEK SOFTCLIX LANCET DEV) KIT, Use to check blood sugar one time a day, Disp: 1 kit, Rfl: 2 .  lidocaine (LIDODERM) 5 %, Place 1 patch onto the skin daily. Remove & Discard patch within 12 hours or as directed by MD, Disp: 30 patch, Rfl: 0 .  LUMIGAN 0.01 % SOLN, Place 1 drop into both eyes at bedtime. , Disp: , Rfl:  .  polyethylene glycol (MIRALAX) packet, Take 17 g by mouth 2 (two) times daily., Disp: 180 packet, Rfl: 1 .  warfarin (COUMADIN) 5 MG tablet, Take one-and-one-half (1&1/2) tablets by mouth once-daily at 6PM--Except on Sundays, take only one (1) tablet., Disp: 40 tablet, Rfl: 1 .  glucose blood (ACCU-CHEK AVIVA) test strip, Use as instructed, Disp: 100 each, Rfl: 12 Past Medical History:  Diagnosis Date  . Anemia     normocytic anemia with baseline hemoglobin 10-11  . Blindness of left eye     likely related to stroke, left cataract removed from that eye with complications  . Calculus gallbladder and bile duct with cholecystitis with  obstruction 01/2015  . Chronic kidney disease 2006    left renal artery stenosis with probable hemodynamic significance, kidneys are normal in morphology without focal lesions or hydronephrosis this is based but cannot A. of the abdomen with and without contrast done on the 31st 2006  . CKD (chronic kidney disease), stage III ([place])   . CVA (cerebral vascular accident) ([place]) 1990's  . CVA (cerebrovascular accident) [facility])  October 2007    CT of the head atrophy with multiple remote insults noted but no definite acute findings  . Glaucoma   . Gout   . Hyperlipidemia   . Hypertension   . Personal history of DVT (deep vein thrombosis) 08/11/2010   BLE  . Type II  diabetes mellitus ([place])    Social History   Socioeconomic History  . Marital status: Widowed    Spouse name: Not on file  . Number of children: Not on file  . Years of education: Not on file  . Highest education level: Not on file  Occupational History  . Not on file  Social Needs  . Financial resource strain: Not on file  . Food insecurity    Worry: Not on file    Inability: Not on file  . Transportation needs    Medical: Not on file    Non-medical: Not on file  Tobacco Use  . Smoking status: Never Smoker  . Smokeless tobacco: Never Used  Substance and Sexual Activity  . Alcohol use: No    Alcohol/week: 0.0 standard drinks  . Drug use: No  . Sexual activity: Not Currently  Lifestyle  . Physical activity    Days per week: Not on file    Minutes per session: Not on file  . Stress: Not on file  Relationships  . Social Herbalist on phone: Not on file    Gets together: Not on file    Attends religious service: Not on file    Active member of club or organization: Not on file    Attends meetings of clubs or organizations: Not on file    Relationship status: Not on file  Other Topics Concern  . Not on file  Social History Narrative    Patient is a widow. She has 11 children 5 of who are living. She is retired in 1993 from [employer]. She denies tobacco alcohol or drug use.   Family History  Problem Relation Age of Onset  . Heart disease Mother   . Diabetes Mother   . Hyperlipidemia Mother   . Hypertension Mother   . Heart disease Father   . Diabetes Father   . Hyperlipidemia Father   . Hypertension Father     ASSESSMENT Recent Results: The most recent result is correlated with 45 mg per week: Lab Results  Component Value Date   INR 2.9 07/02/2019   INR 2.9 06/11/2019   INR 1.3 (A) 05/30/2019    Anticoagulation Dosing: Description   Take one and one-half tablet (1 & 1/2) of your 68m peach-colored warfarin tablet by mouth on Sundays and  Thursdays. All OTHER days--take only one (1) of  your 525mpeach-colored warfarin tablets.      INR today: Therapeutic  PLAN Weekly dose was decreased by 12% to 40 mg per week  Patient Instructions  Patient instructed to take medications as defined in the Anti-coagulation Track section of this encounter.  Patient instructed to take today's dose.  Patient instructed to take one and one-half tablet (  1 & 1/2) of your 28m peach-colored warfarin tablet by mouth on Sundays and Thursdays. All OTHER days--take only one (1) of  your 573mpeach-colored warfarin tablets.  Patient verbalized understanding of these instructions.    Patient advised to contact clinic or seek medical attention if signs/symptoms of bleeding or thromboembolism occur.  Patient verbalized understanding by repeating back information and was advised to contact me if further medication-related questions arise. Patient was also provided an information handout.  Follow-up Return in 4 weeks (on 07/30/2019) for Follow up INR.  JaPennie BanterPharmD, CPP  15 minutes spent face-to-face with the patient during the encounter. 50% of time spent on education, including signs/sx bleeding and clotting, as well as food and drug interactions with warfarin. 50% of time was spent on fingerprick POC INR sample collection,processing, results determination, and documentation in [email or web address]

## 2019-07-02 NOTE — Patient Instructions (Signed)
Patient instructed to take medications as defined in the Anti-coagulation Track section of this encounter.  Patient instructed to take today's dose.  Patient instructed to take one and one-half tablet (1 & 1/2) of your 5mg  peach-colored warfarin tablet by mouth on Sundays and Thursdays. All OTHER days--take only one (1) of  your 5mg  peach-colored warfarin tablets.  Patient verbalized understanding of these instructions.

## 2019-07-03 ENCOUNTER — Encounter: Payer: Self-pay | Admitting: *Deleted

## 2019-07-03 ENCOUNTER — Other Ambulatory Visit: Payer: Medicare Other | Admitting: Hospice

## 2019-07-03 DIAGNOSIS — F0150 Vascular dementia without behavioral disturbance: Secondary | ICD-10-CM

## 2019-07-03 DIAGNOSIS — Z515 Encounter for palliative care: Secondary | ICD-10-CM | POA: Diagnosis not present

## 2019-07-03 NOTE — Progress Notes (Signed)
[facility] Pall[facility] Consult Note Telephone: [phone]  Fax: [phone]  PATIENT NAME: Chelsea Mcdaniel DOB: 07/29/1927 MRN: [id number]  PRIMARY CARE PROVIDER:   [name], MD  REFERRING PROVIDER: Glory Buff, NP  RESPONSIBLE PARTY:   Naika Noto - daughter in law [phone]  TELEHEALTH VISIT STATEMENT Due to the COVID-19 crisis, this visit was done via telephone from my office. It was initiated and consented to by this patient and/or family.  RECOMMENDATIONS/PLAN:   Advance Care Planning/Goals of Care: Telehealth visit consisted of building trust and following up on patient's health status and goals of care clarification.  Patient remains a DNR with goals of care including to maximize quality of life and symptom relief.Spoke with [place] who was not home with patient; also spoke with caregiver Freda Munro who was home with patient.   Symptom management: Patient at baseline with memory loss r/t Dementia, FAST 6c. Patient is incontinent of bladder, ambulatory with a rolling walker, needs assistance with ADLs, feeds self after set up. She has a nursing aid at home two and half hours every day while family members augment care the balance of the time. Encouraged ongoing supportive care. Follow up: Palliative care will continue to follow patient for goals of care clarification and symptom management.  I spent 30 minutes providing this consultation,  from 9am to 9.30am. More than 50% of the time in this consultation was spent coordinating communication.   HISTORY OF PRESENT ILLNESS:  Chelsea Mcdaniel is a 85 y.o. year old female with multiple medical problems including Dementia, CKD III, Type 2 DM. Palliative Care was asked to help address goals of care.   CODE STATUS: DNR  PPS: 50% HOSPICE ELIGIBILITY/DIAGNOSIS: TBD  PAST MEDICAL HISTORY:  Past Medical History:  Diagnosis Date  . Anemia     normocytic anemia with baseline  hemoglobin 10-11  . Blindness of left eye     likely related to stroke, left cataract removed from that eye with complications  . Calculus gallbladder and bile duct with cholecystitis with obstruction 01/2015  . Chronic kidney disease 2006    left renal artery stenosis with probable hemodynamic significance, kidneys are normal in morphology without focal lesions or hydronephrosis this is based but cannot A. of the abdomen with and without contrast done on the 31st 2006  . CKD (chronic kidney disease), stage III ([place])   . CVA (cerebral vascular accident) ([place]) 1990's  . CVA (cerebrovascular accident) [facility])  October 2007    CT of the head atrophy with multiple remote insults noted but no definite acute findings  . Glaucoma   . Gout   . Hyperlipidemia   . Hypertension   . Personal history of DVT (deep vein thrombosis) 08/11/2010   BLE  . Type II diabetes mellitus ([place])     SOCIAL HX:  Social History   Tobacco Use  . Smoking status: Never Smoker  . Smokeless tobacco: Never Used  Substance Use Topics  . Alcohol use: No    Alcohol/week: 0.0 standard drinks    ALLERGIES:  Allergies  Allergen Reactions  . Diltiazem Other (See Comments)    Symptomatic bradaycardia  . Metoprolol Other (See Comments)    Symptomatic bradycardia     PERTINENT MEDICATIONS:  Outpatient Encounter Medications as of 07/03/2019  Medication Sig  . Accu-Chek Softclix Lancets lancets Use to check blood sugar 3 times daily. DIAG CODE E11.9  . allopurinol (ZYLOPRIM) 100 MG tablet TAKE 1/2 TABLET BY  MOUTH EVERY OTHER DAY  . amLODipine (NORVASC) 10 MG tablet TAKE 1 TABLET BY MOUTH EVERY DAY  . Blood Glucose Monitoring Suppl (ACCU-CHEK AVIVA PLUS) w/Device KIT Check blood sugar 1 time a day  . cloNIDine (CATAPRES - DOSED IN MG/24 HR) 0.3 mg/24hr patch APPLY 1 PATCH TO SKIN EVERY 7 DAYS  . dorzolamide-timolol (COSOPT) 22.3-6.8 MG/ML ophthalmic solution Place 1 drop into both eyes 2 (two) times daily.  . furosemide  (LASIX) 10 MG/ML solution Take '80mg'$  qam and '40mg'$  qpm  . GAVILAX 17 GM/SCOOP powder Take 17 grams by mouth 2 times daily  . glucose blood (ACCU-CHEK AVIVA PLUS) test strip USE 1 TIME DAILY TO CHECK BLOOD SUGAR  . glucose blood (ACCU-CHEK AVIVA) test strip Use as instructed  . JANUVIA 25 MG tablet TAKE 1 TABLET BY MOUTH EVERY DAY  . Lancets Misc. (ACCU-CHEK SOFTCLIX LANCET DEV) KIT Use to check blood sugar one time a day  . lidocaine (LIDODERM) 5 % Place 1 patch onto the skin daily. Remove & Discard patch within 12 hours or as directed by MD  . LUMIGAN 0.01 % SOLN Place 1 drop into both eyes at bedtime.   . polyethylene glycol (MIRALAX) packet Take 17 g by mouth 2 (two) times daily.  Marland Kitchen warfarin (COUMADIN) 5 MG tablet Take one-and-one-half (1&1/2) tablets by mouth once-daily at 6PM--Except on Sundays, take only one (1) tablet.   No facility-administered encounter medications on file as of 07/03/2019.      Teodoro Spray, NP

## 2019-07-04 DIAGNOSIS — R296 Repeated falls: Secondary | ICD-10-CM | POA: Diagnosis not present

## 2019-07-04 DIAGNOSIS — N183 Chronic kidney disease, stage 3 unspecified: Secondary | ICD-10-CM | POA: Diagnosis not present

## 2019-07-09 ENCOUNTER — Telehealth: Payer: Self-pay | Admitting: Internal Medicine

## 2019-07-09 NOTE — Telephone Encounter (Signed)
Patient's needs an appointment. Telehealth is fine.

## 2019-07-09 NOTE — Telephone Encounter (Signed)
Return call to [facility]  - stated pt is not sleeping, requesting rx. Also stated pt's toenails are long and needs trimming - requesting a podiatry referral. Thanks

## 2019-07-09 NOTE — Telephone Encounter (Signed)
Informed Chelsea Mcdaniel pt needs to schedula an appt; a telehealth will be ok per Dr Maricela Bo. Agreeable. Call transfer to front office - telehealth appt scheduled for tomorrow.

## 2019-07-09 NOTE — Telephone Encounter (Signed)
Pt's care giver requesting a nurse call back in reference to the patient not being able to sleep and a referral to a Foot [facility]..

## 2019-07-09 NOTE — Telephone Encounter (Signed)
Thank you :)

## 2019-07-10 ENCOUNTER — Other Ambulatory Visit: Payer: Self-pay

## 2019-07-10 ENCOUNTER — Ambulatory Visit (INDEPENDENT_AMBULATORY_CARE_PROVIDER_SITE_OTHER): Payer: Medicare Other | Admitting: Internal Medicine

## 2019-07-10 DIAGNOSIS — G4700 Insomnia, unspecified: Secondary | ICD-10-CM

## 2019-07-10 MED ORDER — MELATONIN 3.5 MG/2ML PO LIQD: 2.0000 mL | Freq: Every evening | ORAL | 1 refills | Status: DC | PRN

## 2019-07-10 NOTE — Progress Notes (Signed)
Internal Medicine Clinic Attending  Case discussed with Dr. Chundi at the time of the visit.  We reviewed the resident's history and exam and pertinent patient test results.  I agree with the assessment, diagnosis, and plan of care documented in the resident's note. 

## 2019-07-10 NOTE — Assessment & Plan Note (Signed)
   Spoke to patient's daughter in law Chelsea Mcdaniel) stating regarding the patient having difficulty sleeping for the past few months/ 1 year. Apparently the patient has only been able to sleep for a few minutes prior to her being wide awake again. She has been walking around. She has been asking to go outdoors occasionally and gets agitated if she is told not to.   Her usual evening routine is to eat dinner around 5-6pm, sleeps "anytime after 8pm" and wakes up aroudn 6am. She usually watches tv throughout the day into the night and ends up falling asleep in her chair.  Patient's family friend Elberta Fortis stays with her overnight and monitors patient   Assessment and plan  The patient was counseled regarding sleep hygiene and establishing a good routine for herself. Encouraged her to sleep in a cool, dark room with a bed and avoid all screens prior to bedtime. Have prescribed melatonin

## 2019-07-10 NOTE — Progress Notes (Signed)
  [facility] Internal Medicine Residency Telephone Encounter Continuity Care Appointment  HPI:   This telephone encounter was created for [name] on 07/10/2019 for the following purpose/cc difficulty sleeping.   Past Medical History:  Past Medical History:  Diagnosis Date  . Anemia     normocytic anemia with baseline hemoglobin 10-11  . Blindness of left eye     likely related to stroke, left cataract removed from that eye with complications  . Calculus gallbladder and bile duct with cholecystitis with obstruction 01/2015  . Chronic kidney disease 2006    left renal artery stenosis with probable hemodynamic significance, kidneys are normal in morphology without focal lesions or hydronephrosis this is based but cannot A. of the abdomen with and without contrast done on the 31st 2006  . CKD (chronic kidney disease), stage III ([place])   . CVA (cerebral vascular accident) ([place]) 1990's  . CVA (cerebrovascular accident) [facility])  October 2007    CT of the head atrophy with multiple remote insults noted but no definite acute findings  . Glaucoma   . Gout   . Hyperlipidemia   . Hypertension   . Personal history of DVT (deep vein thrombosis) 08/11/2010   BLE  . Type II diabetes mellitus ([facility])       ROS:   Per HPI   Assessment / Plan / Recommendations:   Please see A&P under problem oriented charting for assessment of the patient's acute and chronic medical conditions.   As always, pt is advised that if symptoms worsen or new symptoms arise, they should go to an urgent care facility or to to ER for further evaluation.   Consent and Medical Decision Making:   Patient discussed with Dr. Philipp Ovens  This is a telephone encounter between Chelsea Mcdaniel and Bethene Hankinson on 07/10/2019 for sleep difficulties. The visit was conducted with the patient located at home and Ace Bergfeld at [facility]. The patient's identity was confirmed using their DOB and current address. The patient has  consented to being evaluated through a telephone encounter and understands the associated risks (an examination cannot be done and the patient may need to come in for an appointment) / benefits (allows the patient to remain at home, decreasing exposure to coronavirus). I personally spent 10 minutes on medical discussion.

## 2019-07-11 ENCOUNTER — Other Ambulatory Visit: Payer: Self-pay

## 2019-07-11 ENCOUNTER — Encounter (HOSPITAL_COMMUNITY)
Admission: RE | Admit: 2019-07-11 | Discharge: 2019-07-11 | Disposition: A | Discharge status: Home / Self Care | Payer: Medicare Other | Source: Ambulatory Visit | Attending: Nephrology | Admitting: Nephrology

## 2019-07-11 VITALS — BP 178/85 | HR 69 | Temp 96.8°F | Resp 20

## 2019-07-11 DIAGNOSIS — N184 Chronic kidney disease, stage 4 (severe): Secondary | ICD-10-CM

## 2019-07-11 LAB — FERRITIN: Ferritin: 919 ng/mL — ABNORMAL HIGH (ref 11–307)

## 2019-07-11 LAB — IRON AND TIBC
Iron: 82 ug/dL (ref 28–170)
Saturation Ratios: 38 % — ABNORMAL HIGH (ref 10.4–31.8)
TIBC: 216 ug/dL — ABNORMAL LOW (ref 250–450)
UIBC: 134 ug/dL

## 2019-07-11 LAB — POCT HEMOGLOBIN-HEMACUE: Hemoglobin: 12.9 g/dL (ref 12.0–15.0)

## 2019-07-11 MED ORDER — EPOETIN ALFA-EPBX 10000 UNIT/ML IJ SOLN: 10000.0000 [IU] | INTRAMUSCULAR | Status: DC

## 2019-07-16 ENCOUNTER — Other Ambulatory Visit: Payer: Self-pay | Admitting: Pharmacist

## 2019-07-23 NOTE — Progress Notes (Signed)
So noted. Thank you. 

## 2019-07-25 ENCOUNTER — Ambulatory Visit (HOSPITAL_COMMUNITY)
Admission: RE | Admit: 2019-07-25 | Discharge: 2019-07-25 | Disposition: A | Discharge status: Home / Self Care | Payer: Medicare Other | Source: Ambulatory Visit | Attending: Nephrology | Admitting: Nephrology

## 2019-07-25 ENCOUNTER — Other Ambulatory Visit: Payer: Self-pay

## 2019-07-25 VITALS — BP 182/77 | HR 68 | Temp 95.5°F | Resp 20

## 2019-07-25 DIAGNOSIS — N184 Chronic kidney disease, stage 4 (severe): Secondary | ICD-10-CM | POA: Diagnosis not present

## 2019-07-25 LAB — POCT HEMOGLOBIN-HEMACUE: Hemoglobin: 11.7 g/dL — ABNORMAL LOW (ref 12.0–15.0)

## 2019-07-25 MED ORDER — EPOETIN ALFA-EPBX 10000 UNIT/ML IJ SOLN: 10000.0000 [IU] | INTRAMUSCULAR | Status: DC

## 2019-07-25 MED ORDER — EPOETIN ALFA-EPBX 10000 UNIT/ML IJ SOLN
INTRAMUSCULAR | Status: AC
  Administered 2019-07-25: 10000 [IU] via SUBCUTANEOUS
  Filled 2019-07-25: qty 1

## 2019-07-30 ENCOUNTER — Ambulatory Visit (INDEPENDENT_AMBULATORY_CARE_PROVIDER_SITE_OTHER): Payer: Medicare Other | Admitting: Pharmacist

## 2019-07-30 DIAGNOSIS — Z86718 Personal history of other venous thrombosis and embolism: Secondary | ICD-10-CM | POA: Diagnosis not present

## 2019-07-30 DIAGNOSIS — Z7901 Long term (current) use of anticoagulants: Secondary | ICD-10-CM

## 2019-07-30 DIAGNOSIS — Z5181 Encounter for therapeutic drug level monitoring: Secondary | ICD-10-CM | POA: Diagnosis not present

## 2019-07-30 LAB — POCT INR: INR: 2.9 (ref 2.0–3.0)

## 2019-07-30 NOTE — Patient Instructions (Signed)
Patient instructed to take medications as defined in the Anti-coagulation Track section of this encounter.  Patient instructed to take today's dose.  Patient instructed to take  one and one-half tablet (1 & 1/2) of your 5mg  peach-colored warfarin tablet by mouth on Sundays. All OTHER days--take only one (1) of  your 5mg  peach-colored warfarin  Patient verbalized understanding of these instructions.

## 2019-07-30 NOTE — Progress Notes (Signed)
Anticoagulation Management Chelsea Mcdaniel is a 86 y.o. female who reports to the clinic for monitoring of warfarin treatment.    Indication: DVT, History of (resolved); Long term current use of anticoagulant.  Duration: indefinite Supervising physician: Velna Ochs, MD  Anticoagulation Clinic Visit History: Patient does not report signs/symptoms of bleeding or thromboembolism  Other recent changes: No diet, medications, lifestyle changes.  Anticoagulation Episode Summary    Current INR goal:  2.0-3.0  TTR:  66.2 % (5.5 y)  Next INR check:  08/27/2019  INR from last check:  2.9 (07/30/2019)  Weekly max warfarin dose:    Target end date:    INR check location:    Preferred lab:    Send INR reminders to:     Indications   EMBOLISM/THROMBOSIS DEEP VSL LWR EXTRM NOS (Resolved) [I82.409] Long-term (current) use of anticoagulants [Z79.01]       Comments:          Allergies  Allergen Reactions  . Diltiazem Other (See Comments)    Symptomatic bradaycardia  . Metoprolol Other (See Comments)    Symptomatic bradycardia    Current Outpatient Medications:  .  Accu-Chek Softclix Lancets lancets, Use to check blood sugar 3 times daily. DIAG CODE E11.9, Disp: 300 each, Rfl: 1 .  allopurinol (ZYLOPRIM) 100 MG tablet, TAKE 1/2 TABLET BY MOUTH EVERY OTHER DAY, Disp: 45 tablet, Rfl: 1 .  amLODipine (NORVASC) 10 MG tablet, TAKE 1 TABLET BY MOUTH EVERY DAY, Disp: 90 tablet, Rfl: 1 .  Blood Glucose Monitoring Suppl (ACCU-CHEK AVIVA PLUS) w/Device KIT, Check blood sugar 1 time a day, Disp: 1 kit, Rfl: 0 .  cloNIDine (CATAPRES - DOSED IN MG/24 HR) 0.3 mg/24hr patch, APPLY 1 PATCH TO SKIN EVERY 7 DAYS, Disp: 12 patch, Rfl: 1 .  dorzolamide-timolol (COSOPT) 22.3-6.8 MG/ML ophthalmic solution, Place 1 drop into both eyes 2 (two) times daily., Disp: , Rfl: 3 .  furosemide (LASIX) 10 MG/ML solution, Take '80mg'$  qam and '40mg'$  qpm, Disp: 120 mL, Rfl: 1 .  GAVILAX 17 GM/SCOOP powder, Take 17 grams by  mouth 2 times daily, Disp: 850 g, Rfl: 2 .  glucose blood (ACCU-CHEK AVIVA PLUS) test strip, USE 1 TIME DAILY TO CHECK BLOOD SUGAR, Disp: 100 each, Rfl: 3 .  JANUVIA 25 MG tablet, TAKE 1 TABLET BY MOUTH EVERY DAY, Disp: 90 tablet, Rfl: 1 .  Lancets Misc. (ACCU-CHEK SOFTCLIX LANCET DEV) KIT, Use to check blood sugar one time a day, Disp: 1 kit, Rfl: 2 .  lidocaine (LIDODERM) 5 %, Place 1 patch onto the skin daily. Remove & Discard patch within 12 hours or as directed by MD, Disp: 30 patch, Rfl: 0 .  LUMIGAN 0.01 % SOLN, Place 1 drop into both eyes at bedtime. , Disp: , Rfl:  .  Melatonin 3.5 MG/2ML LIQD, Take 2 mLs by mouth at bedtime as needed., Disp: 59.1 mL, Rfl: 1 .  polyethylene glycol (MIRALAX) packet, Take 17 g by mouth 2 (two) times daily., Disp: 180 packet, Rfl: 1 .  warfarin (COUMADIN) 5 MG tablet, TAKE ONE  TABLET BY MOUTH DAILY AT 6 PM EXCEPT ON SUNDAYS AND THURSDAYS, TAKE 1&1/2  TABLETS BY MOUTH ON SUNDAYS AND THURSDAYS., Disp: 32 tablet, Rfl: 2 .  glucose blood (ACCU-CHEK AVIVA) test strip, Use as instructed, Disp: 100 each, Rfl: 12 Past Medical History:  Diagnosis Date  . Anemia     normocytic anemia with baseline hemoglobin 10-11  . Blindness of left eye  likely related to stroke, left cataract removed from that eye with complications  . Calculus gallbladder and bile duct with cholecystitis with obstruction 01/2015  . Chronic kidney disease 2006    left renal artery stenosis with probable hemodynamic significance, kidneys are normal in morphology without focal lesions or hydronephrosis this is based but cannot A. of the abdomen with and without contrast done on the 31st 2006  . CKD (chronic kidney disease), stage III ([place])   . CVA (cerebral vascular accident) ([place]) 1990's  . CVA (cerebrovascular accident) [facility])  October 2007    CT of the head atrophy with multiple remote insults noted but no definite acute findings  . Glaucoma   . Gout   . Hyperlipidemia   . Hypertension    . Personal history of DVT (deep vein thrombosis) 08/11/2010   BLE  . Type II diabetes mellitus ([place])    Social History   Socioeconomic History  . Marital status: Widowed    Spouse name: Not on file  . Number of children: Not on file  . Years of education: Not on file  . Highest education level: Not on file  Occupational History  . Not on file  Tobacco Use  . Smoking status: Never Smoker  . Smokeless tobacco: Never Used  Substance and Sexual Activity  . Alcohol use: No    Alcohol/week: 0.0 standard drinks  . Drug use: No  . Sexual activity: Not Currently  Other Topics Concern  . Not on file  Social History Narrative    Patient is a widow. She has 11 children 5 of who are living. She is retired in 1993 from [employer]. She denies tobacco alcohol or drug use.   Social Determinants of Health   Financial Resource Strain:   . Difficulty of Paying Living Expenses: Not on file  Food Insecurity:   . Worried About Charity fundraiser in the Last Year: Not on file  . Ran Out of Food in the Last Year: Not on file  Transportation Needs:   . Lack of Transportation (Medical): Not on file  . Lack of Transportation (Non-Medical): Not on file  Physical Activity:   . Days of Exercise per Week: Not on file  . Minutes of Exercise per Session: Not on file  Stress:   . Feeling of Stress : Not on file  Social Connections:   . Frequency of Communication with Friends and Family: Not on file  . Frequency of Social Gatherings with Friends and Family: Not on file  . Attends Religious Services: Not on file  . Active Member of Clubs or Organizations: Not on file  . Attends Archivist Meetings: Not on file  . Marital Status: Not on file   Family History  Problem Relation Age of Onset  . Heart disease Mother   . Diabetes Mother   . Hyperlipidemia Mother   . Hypertension Mother   . Heart disease Father   . Diabetes Father   . Hyperlipidemia Father   . Hypertension Father      ASSESSMENT Recent Results: The most recent result is correlated with 40 mg per week: Lab Results  Component Value Date   INR 2.9 07/30/2019   INR 2.9 07/02/2019   INR 2.9 06/11/2019    Anticoagulation Dosing: Description   Take one and one-half tablet (1 & 1/2) of your '5mg'$  peach-colored warfarin tablet by mouth on Sundays. All OTHER days--take only one (1) of  your '5mg'$  peach-colored warfarin tablets.  INR today: Therapeutic  PLAN Weekly dose was decreased by 6% to 37.5 mg per week  Patient Instructions  Patient instructed to take medications as defined in the Anti-coagulation Track section of this encounter.  Patient instructed to take today's dose.  Patient instructed to take  one and one-half tablet (1 & 1/2) of your '5mg'$  peach-colored warfarin tablet by mouth on Sundays. All OTHER days--take only one (1) of  your '5mg'$  peach-colored warfarin  Patient verbalized understanding of these instructions.    Patient advised to contact clinic or seek medical attention if signs/symptoms of bleeding or thromboembolism occur.  Patient verbalized understanding by repeating back information and was advised to contact me if further medication-related questions arise. Patient was also provided an information handout.  Follow-up Return in 4 weeks (on 08/27/2019) for Follow up INR.  Pennie Banter, PharmD, CPP  15 minutes spent face-to-face with the patient during the encounter. 50% of time spent on education, including signs/sx bleeding and clotting, as well as food and drug interactions with warfarin. 50% of time was spent on fingerprick POC INR sample collection,processing, results determination, and documentation in [email or web address].[email or web address]

## 2019-07-30 NOTE — Progress Notes (Signed)
INTERNAL MEDICINE TEACHING ATTENDING ADDENDUM   I agree with pharmacy recommendations as outlined in their note.   Belal Scallon, MD  

## 2019-08-04 DIAGNOSIS — R296 Repeated falls: Secondary | ICD-10-CM | POA: Diagnosis not present

## 2019-08-04 DIAGNOSIS — N183 Chronic kidney disease, stage 3 unspecified: Secondary | ICD-10-CM | POA: Diagnosis not present

## 2019-08-08 ENCOUNTER — Ambulatory Visit (HOSPITAL_COMMUNITY)
Admission: RE | Admit: 2019-08-08 | Discharge: 2019-08-08 | Disposition: A | Discharge status: Home / Self Care | Payer: Medicare Other | Source: Ambulatory Visit | Attending: Nephrology | Admitting: Nephrology

## 2019-08-08 ENCOUNTER — Other Ambulatory Visit: Payer: Self-pay

## 2019-08-08 VITALS — BP 168/81 | HR 70 | Temp 96.6°F | Resp 20

## 2019-08-08 DIAGNOSIS — N184 Chronic kidney disease, stage 4 (severe): Secondary | ICD-10-CM | POA: Insufficient documentation

## 2019-08-08 LAB — IRON AND TIBC
Iron: 74 ug/dL (ref 28–170)
Saturation Ratios: 35 % — ABNORMAL HIGH (ref 10.4–31.8)
TIBC: 214 ug/dL — ABNORMAL LOW (ref 250–450)
UIBC: 140 ug/dL

## 2019-08-08 LAB — CBC
HCT: 36.3 % (ref 36.0–46.0)
Hemoglobin: 11.5 g/dL — ABNORMAL LOW (ref 12.0–15.0)
MCH: 31.3 pg (ref 26.0–34.0)
MCHC: 31.7 g/dL (ref 30.0–36.0)
MCV: 98.6 fL (ref 80.0–100.0)
Platelets: 233 10*3/uL (ref 150–400)
RBC: 3.68 MIL/uL — ABNORMAL LOW (ref 3.87–5.11)
RDW: 13.8 % (ref 11.5–15.5)
WBC: 5.7 10*3/uL (ref 4.0–10.5)
nRBC: 0 % (ref 0.0–0.2)

## 2019-08-08 LAB — RENAL FUNCTION PANEL
Albumin: 3.5 g/dL (ref 3.5–5.0)
Anion gap: 10 (ref 5–15)
BUN: 40 mg/dL — ABNORMAL HIGH (ref 8–23)
CO2: 25 mmol/L (ref 22–32)
Calcium: 9.2 mg/dL (ref 8.9–10.3)
Chloride: 103 mmol/L (ref 98–111)
Creatinine, Ser: 3.04 mg/dL — ABNORMAL HIGH (ref 0.44–1.00)
GFR calc Af Amer: 15 mL/min — ABNORMAL LOW (ref >60)
GFR calc non Af Amer: 13 mL/min — ABNORMAL LOW (ref >60)
Glucose, Bld: 233 mg/dL — ABNORMAL HIGH (ref 70–99)
Phosphorus: 3.8 mg/dL (ref 2.5–4.6)
Potassium: 4.6 mmol/L (ref 3.5–5.1)
Sodium: 138 mmol/L (ref 135–145)

## 2019-08-08 LAB — FERRITIN: Ferritin: 771 ng/mL — ABNORMAL HIGH (ref 11–307)

## 2019-08-08 LAB — POCT HEMOGLOBIN-HEMACUE: Hemoglobin: 12.2 g/dL (ref 12.0–15.0)

## 2019-08-08 LAB — VITAMIN D 25 HYDROXY (VIT D DEFICIENCY, FRACTURES): Vit D, 25-Hydroxy: 24.3 ng/mL — ABNORMAL LOW (ref 30–100)

## 2019-08-08 MED ORDER — EPOETIN ALFA-EPBX 10000 UNIT/ML IJ SOLN: 10000.0000 [IU] | INTRAMUSCULAR | Status: DC

## 2019-08-09 LAB — PTH, INTACT AND CALCIUM
Calcium, Total (PTH): 9.2 mg/dL (ref 8.7–10.3)
PTH: 144 pg/mL — ABNORMAL HIGH (ref 15–65)

## 2019-08-14 ENCOUNTER — Other Ambulatory Visit: Payer: Self-pay | Admitting: Internal Medicine

## 2019-08-22 ENCOUNTER — Other Ambulatory Visit: Payer: Self-pay

## 2019-08-22 ENCOUNTER — Ambulatory Visit (HOSPITAL_COMMUNITY)
Admission: RE | Admit: 2019-08-22 | Discharge: 2019-08-22 | Disposition: A | Discharge status: Home / Self Care | Payer: Medicare Other | Source: Ambulatory Visit | Attending: Nephrology | Admitting: Nephrology

## 2019-08-22 VITALS — BP 176/66 | HR 67 | Temp 95.7°F | Resp 20

## 2019-08-22 DIAGNOSIS — H401113 Primary open-angle glaucoma, right eye, severe stage: Secondary | ICD-10-CM | POA: Diagnosis not present

## 2019-08-22 DIAGNOSIS — H2511 Age-related nuclear cataract, right eye: Secondary | ICD-10-CM | POA: Diagnosis not present

## 2019-08-22 DIAGNOSIS — H2702 Aphakia, left eye: Secondary | ICD-10-CM | POA: Diagnosis not present

## 2019-08-22 DIAGNOSIS — N184 Chronic kidney disease, stage 4 (severe): Secondary | ICD-10-CM | POA: Insufficient documentation

## 2019-08-22 DIAGNOSIS — H338 Other retinal detachments: Secondary | ICD-10-CM | POA: Diagnosis not present

## 2019-08-22 LAB — POCT HEMOGLOBIN-HEMACUE: Hemoglobin: 11.7 g/dL — ABNORMAL LOW (ref 12.0–15.0)

## 2019-08-22 MED ORDER — EPOETIN ALFA-EPBX 10000 UNIT/ML IJ SOLN
10000.0000 [IU] | INTRAMUSCULAR | Status: DC
  Administered 2019-08-22: 10000 [IU] via SUBCUTANEOUS

## 2019-08-22 MED ORDER — EPOETIN ALFA-EPBX 10000 UNIT/ML IJ SOLN
INTRAMUSCULAR | Status: AC
  Filled 2019-08-22: qty 1

## 2019-08-23 DIAGNOSIS — R600 Localized edema: Secondary | ICD-10-CM | POA: Diagnosis not present

## 2019-08-23 DIAGNOSIS — N2581 Secondary hyperparathyroidism of renal origin: Secondary | ICD-10-CM | POA: Diagnosis not present

## 2019-08-23 DIAGNOSIS — I129 Hypertensive chronic kidney disease with stage 1 through stage 4 chronic kidney disease, or unspecified chronic kidney disease: Secondary | ICD-10-CM | POA: Diagnosis not present

## 2019-08-23 DIAGNOSIS — N184 Chronic kidney disease, stage 4 (severe): Secondary | ICD-10-CM | POA: Diagnosis not present

## 2019-08-23 DIAGNOSIS — D631 Anemia in chronic kidney disease: Secondary | ICD-10-CM | POA: Diagnosis not present

## 2019-08-25 NOTE — Progress Notes (Signed)
   CC: Hypertension   HPI:  Chelsea Mcdaniel is a 86 y.o. female with dm2, htn, ck4, chronic gout who presented for follow up of hypertension. Please see problem based charting for evaluation, assessment, and plan.  Patient had some minor skin breakdown at the superior aspect of intergluteal cleft. She was given soft pads to place over it to prevent skin ulcerations.  Past Medical History:  Diagnosis Date  . Anemia     normocytic anemia with baseline hemoglobin 10-11  . Blindness of left eye     likely related to stroke, left cataract removed from that eye with complications  . Calculus gallbladder and bile duct with cholecystitis with obstruction 01/2015  . Chronic kidney disease 2006    left renal artery stenosis with probable hemodynamic significance, kidneys are normal in morphology without focal lesions or hydronephrosis this is based but cannot A. of the abdomen with and without contrast done on the 31st 2006  . CKD (chronic kidney disease), stage III   . CVA (cerebral vascular accident) ([place]) 1990's  . CVA (cerebrovascular accident) [facility])  October 2007    CT of the head atrophy with multiple remote insults noted but no definite acute findings  . Glaucoma   . Gout   . Hyperlipidemia   . Hypertension   . Personal history of DVT (deep vein thrombosis) 08/11/2010   BLE  . Type II diabetes mellitus ([facility])    Review of Systems:    Denies any cough, chest pain, headaches, muscle aches   Physical Exam:  Vitals:   08/30/19 1332  BP: (!) 177/75  Pulse: 64  Temp: 98.3 F (36.8 C)  TempSrc: Oral  SpO2: 100%  Weight: 127 lb 8 oz (57.8 kg)    Physical Exam  Constitutional: She appears well-developed and well-nourished. No distress.  Pleasant female sitting in wheelchair  HENT:  Head: Normocephalic and atraumatic.  Eyes: Conjunctivae are normal.  Cardiovascular: Normal rate, regular rhythm and normal heart sounds.  Respiratory: Effort normal and breath sounds normal. No  respiratory distress. She has no wheezes.  GI: Soft. Bowel sounds are normal. She exhibits no distension. There is no abdominal tenderness.  Musculoskeletal:        General: No edema.  Neurological:  Oriented to place, name, but unable to state what month or year it is.   Skin: She is not diaphoretic.  Psychiatric: She has a normal mood and affect. Her behavior is normal. Judgment and thought content normal.    Assessment & Plan:   See Encounters Tab for problem based charting.  Patient discussed with Dr. Philipp Ovens

## 2019-08-27 ENCOUNTER — Telehealth: Payer: Self-pay | Admitting: Pharmacist

## 2019-08-27 ENCOUNTER — Other Ambulatory Visit: Payer: Self-pay

## 2019-08-27 ENCOUNTER — Other Ambulatory Visit (INDEPENDENT_AMBULATORY_CARE_PROVIDER_SITE_OTHER): Payer: Medicare Other

## 2019-08-27 DIAGNOSIS — Z7901 Long term (current) use of anticoagulants: Secondary | ICD-10-CM | POA: Diagnosis not present

## 2019-08-27 DIAGNOSIS — Z86718 Personal history of other venous thrombosis and embolism: Secondary | ICD-10-CM

## 2019-08-27 DIAGNOSIS — Z5181 Encounter for therapeutic drug level monitoring: Secondary | ICD-10-CM | POA: Diagnosis not present

## 2019-08-27 LAB — POCT INR: INR: 1.5 (ref 2.0–3.0)

## 2019-08-27 NOTE — Telephone Encounter (Signed)
Called and spoke with [place]. Shared INR 1.5. INCREASED to 1 and 1/2 x 5mg  (7.5mg ) MWF; 5mg  all other days. Repeat INR [id number].

## 2019-08-28 ENCOUNTER — Other Ambulatory Visit: Payer: Self-pay | Admitting: Internal Medicine

## 2019-08-28 DIAGNOSIS — G4700 Insomnia, unspecified: Secondary | ICD-10-CM

## 2019-08-28 NOTE — Telephone Encounter (Signed)
Thank you :)

## 2019-08-29 ENCOUNTER — Other Ambulatory Visit: Payer: Self-pay

## 2019-08-29 ENCOUNTER — Other Ambulatory Visit: Payer: Medicare Other | Admitting: Hospice

## 2019-08-29 DIAGNOSIS — Z515 Encounter for palliative care: Secondary | ICD-10-CM

## 2019-08-29 DIAGNOSIS — F0150 Vascular dementia without behavioral disturbance: Secondary | ICD-10-CM

## 2019-08-29 NOTE — Progress Notes (Signed)
[facility] Consult Note Telephone: [phone]  Fax: [phone]  PATIENT NAME: Chelsea Mcdaniel DOB: 19-Jun-1928 MRN: [id number]  PRIMARY CARE PROVIDER:   [name], MD  REFERRING PROVIDER:Michelle Schmerge, NP  RESPONSIBLE PARTY:Georgeanna Abbs - daughter in law [phone]  TELEHEALTH VISIT STATEMENT Due to the COVID-19 crisis, this visit was done via telephone from my office. It was initiated and consented to by this patient and/or family.  RECOMMENDATIONS/PLAN:  Advance Care Planning/Goals of Care: Telehealth visit consisted of building trust and following up on palliative care. Patient remains a DNR with goals of care including to maximize quality of life and symptom relief. Symptom management:No acute changes/symptoms. Insomnia well managed with PRN Melatonin at bedtime. Patient at baseline with memory loss r/t Dementia, FAST 6c. Patient is incontinent of bladder, ambulatory with a rolling walker, needs assistance with ADLs, feeds self after set up.  Patient with no concerns. Caregiver Freda Munro with no concerns; affirmed patient sleeping well, eating well and getting around as usual. .  Encouraged ongoing supportive care. Follow TD:[id number] care will continue to follow patient for goals of care clarification and symptom management. I spent 30 minutes providing this consultation.  More than 50% of the time in this consultation was spent coordinating communication.   HISTORY OF PRESENT ILLNESS:Chelsea M Womackis a 86 y.o.year oldfemalewith multiple medical problems including Dementia, CKD III, Type 2 DM. Palliative Care was asked to help address goals of care.  CODE STATUS: DNR  PPS: 50% HOSPICE ELIGIBILITY/DIAGNOSIS: TBD  PAST MEDICAL HISTORY:  Past Medical History:  Diagnosis Date  . Anemia     normocytic anemia with baseline hemoglobin 10-11  . Blindness of left eye     likely related to stroke, left  cataract removed from that eye with complications  . Calculus gallbladder and bile duct with cholecystitis with obstruction 01/2015  . Chronic kidney disease 2006    left renal artery stenosis with probable hemodynamic significance, kidneys are normal in morphology without focal lesions or hydronephrosis this is based but cannot A. of the abdomen with and without contrast done on the 31st 2006  . CKD (chronic kidney disease), stage III ([place])   . CVA (cerebral vascular accident) ([place]) 1990's  . CVA (cerebrovascular accident) [facility])  October 2007    CT of the head atrophy with multiple remote insults noted but no definite acute findings  . Glaucoma   . Gout   . Hyperlipidemia   . Hypertension   . Personal history of DVT (deep vein thrombosis) 08/11/2010   BLE  . Type II diabetes mellitus ([place])     SOCIAL HX:  Social History   Tobacco Use  . Smoking status: Never Smoker  . Smokeless tobacco: Never Used  Substance Use Topics  . Alcohol use: No    Alcohol/week: 0.0 standard drinks    ALLERGIES:  Allergies  Allergen Reactions  . Diltiazem Other (See Comments)    Symptomatic bradaycardia  . Metoprolol Other (See Comments)    Symptomatic bradycardia     PERTINENT MEDICATIONS:  Outpatient Encounter Medications as of 08/29/2019  Medication Sig  . Melatonin 1 MG/ML LIQD TAKE 3.5 mls BY MOUTH AT BEDTIME AS NEEDED  . Accu-Chek Softclix Lancets lancets Use to check blood sugar 3 times daily. DIAG CODE E11.9  . allopurinol (ZYLOPRIM) 100 MG tablet TAKE 1/2 TABLET BY MOUTH EVERY OTHER DAY  . amLODipine (NORVASC) 10 MG tablet TAKE 1 TABLET BY MOUTH EVERY DAY  .  Blood Glucose Monitoring Suppl (ACCU-CHEK AVIVA PLUS) w/Device KIT Check blood sugar 1 time a day  . cloNIDine (CATAPRES - DOSED IN MG/24 HR) 0.3 mg/24hr patch APPLY 1 PATCH TO SKIN EVERY 7 DAYS  . dorzolamide-timolol (COSOPT) 22.3-6.8 MG/ML ophthalmic solution Place 1 drop into both eyes 2 (two) times daily.  . furosemide (LASIX) 10  MG/ML solution Take '80mg'$  qam and '40mg'$  qpm  . GAVILAX 17 GM/SCOOP powder Take 17 grams by mouth 2 times daily  . glucose blood (ACCU-CHEK AVIVA PLUS) test strip USE 1 TIME DAILY TO CHECK BLOOD SUGAR  . glucose blood (ACCU-CHEK AVIVA) test strip Use as instructed  . JANUVIA 25 MG tablet TAKE 1 TABLET BY MOUTH EVERY DAY  . Lancets Misc. (ACCU-CHEK SOFTCLIX LANCET DEV) KIT Use to check blood sugar one time a day  . lidocaine (LIDODERM) 5 % Place 1 patch onto the skin daily. Remove & Discard patch within 12 hours or as directed by MD  . LUMIGAN 0.01 % SOLN Place 1 drop into both eyes at bedtime.   . polyethylene glycol (MIRALAX) packet Take 17 g by mouth 2 (two) times daily.  Marland Kitchen warfarin (COUMADIN) 5 MG tablet TAKE ONE  TABLET BY MOUTH DAILY AT 6 PM EXCEPT ON SUNDAYS AND THURSDAYS, TAKE 1&1/2  TABLETS BY MOUTH ON SUNDAYS AND THURSDAYS.   No facility-administered encounter medications on file as of 08/29/2019.    Teodoro Spray, NP

## 2019-08-30 ENCOUNTER — Encounter: Payer: Self-pay | Admitting: Internal Medicine

## 2019-08-30 ENCOUNTER — Ambulatory Visit (INDEPENDENT_AMBULATORY_CARE_PROVIDER_SITE_OTHER): Payer: Medicare Other | Admitting: Internal Medicine

## 2019-08-30 ENCOUNTER — Other Ambulatory Visit: Payer: Self-pay

## 2019-08-30 VITALS — BP 177/75 | HR 64 | Temp 98.3°F | Wt 127.5 lb

## 2019-08-30 DIAGNOSIS — I129 Hypertensive chronic kidney disease with stage 1 through stage 4 chronic kidney disease, or unspecified chronic kidney disease: Secondary | ICD-10-CM | POA: Diagnosis not present

## 2019-08-30 DIAGNOSIS — L98411 Non-pressure chronic ulcer of buttock limited to breakdown of skin: Secondary | ICD-10-CM

## 2019-08-30 DIAGNOSIS — Z66 Do not resuscitate: Secondary | ICD-10-CM

## 2019-08-30 DIAGNOSIS — Z7984 Long term (current) use of oral hypoglycemic drugs: Secondary | ICD-10-CM

## 2019-08-30 DIAGNOSIS — Z79899 Other long term (current) drug therapy: Secondary | ICD-10-CM

## 2019-08-30 DIAGNOSIS — M109 Gout, unspecified: Secondary | ICD-10-CM | POA: Diagnosis not present

## 2019-08-30 DIAGNOSIS — E1159 Type 2 diabetes mellitus with other circulatory complications: Secondary | ICD-10-CM

## 2019-08-30 DIAGNOSIS — E1122 Type 2 diabetes mellitus with diabetic chronic kidney disease: Secondary | ICD-10-CM

## 2019-08-30 DIAGNOSIS — N184 Chronic kidney disease, stage 4 (severe): Secondary | ICD-10-CM | POA: Diagnosis not present

## 2019-08-30 DIAGNOSIS — Z7189 Other specified counseling: Secondary | ICD-10-CM

## 2019-08-30 LAB — POCT GLYCOSYLATED HEMOGLOBIN (HGB A1C): Hemoglobin A1C: 6.8 % — AB (ref 4.0–5.6)

## 2019-08-30 LAB — GLUCOSE, CAPILLARY: Glucose-Capillary: 200 mg/dL — ABNORMAL HIGH (ref 70–99)

## 2019-08-30 NOTE — Assessment & Plan Note (Signed)
The patient's blood pressure during this visit was 177/75. The patient is currently taking amlodipine 10mg  qd, furosemide 80mg  qam and 40mg  qpm, clonidine 0.3mg  patch and was recently started on hydralazine by nephrology. His last blood pressure visits are   BP Readings from Last 3 Encounters:  08/30/19 (!) 177/75  08/22/19 (!) 176/66  08/08/19 (!) 168/81  The patient does not report palpitations, dizziness, chest pain, sob.  Assessment and Plan Continue current regimen and follow up with nephrology regarding possible need to start HD.

## 2019-08-30 NOTE — Assessment & Plan Note (Signed)
The patient is DNR. She is being followed by [facility]. Ms. Alsman is a functional female who lives by herself, but has 24 hour care from family members. She has 6 children 4 who are currently living- 2 daughters and 2 sons. 1 son in [place], 1 daughter and 1 son in [place], and 1 daughter in [place]. Ms. Crite lives alone. Mr. Milus Banister, Alton's wife Cathie Olden, and daugheter's boyfriend are the primary people who check on Ms. Flesch.    Ms. Saccente daughter in law states that "her dementia is gets worse". Apparently Ms. stembridge has not been remembering things. She has been asking about people who died. She also frequently asks to go outside. Some days she has good days.   -frequent re-orientation -continue remote health

## 2019-08-30 NOTE — Progress Notes (Signed)
Internal Medicine Clinic Attending  Case discussed with Dr. Chundi at the time of the visit.  We reviewed the resident's history and exam and pertinent patient test results.  I agree with the assessment, diagnosis, and plan of care documented in the resident's note. 

## 2019-08-30 NOTE — Patient Instructions (Signed)
It was a pleasure to see you today Ms. Gillham. Please make the following changes:  -please continue taking all of your medications  -please follow up with your kidney doctor  -please continue to monitor for any rashes that occur over body -follow up in 3 months  If you have any questions or concerns, please call our clinic at [phone] between 9am-5pm and after hours call [phone] and ask for the internal medicine resident on call. If you feel you are having a medical emergency please call 911.   Thank you, we look forward to help you remain healthy!  [name], MD Internal Medicine PGY3

## 2019-08-30 NOTE — Assessment & Plan Note (Signed)
Patient recently saw Nephrology, will review paperwork when I receive it.

## 2019-08-30 NOTE — Assessment & Plan Note (Signed)
The patient's last a1c= 6.8 on October 2020. The patient is currently taking [place] 25mg  qd. The patient is compliant with medication. She does not have significant weight changes.  -continue januvia 25mg  qd

## 2019-09-04 DIAGNOSIS — R296 Repeated falls: Secondary | ICD-10-CM | POA: Diagnosis not present

## 2019-09-04 DIAGNOSIS — N183 Chronic kidney disease, stage 3 unspecified: Secondary | ICD-10-CM | POA: Diagnosis not present

## 2019-09-05 ENCOUNTER — Ambulatory Visit (HOSPITAL_COMMUNITY)
Admission: RE | Admit: 2019-09-05 | Discharge: 2019-09-05 | Disposition: A | Discharge status: Home / Self Care | Payer: Medicare Other | Source: Ambulatory Visit | Attending: Nephrology | Admitting: Nephrology

## 2019-09-05 ENCOUNTER — Other Ambulatory Visit: Payer: Self-pay

## 2019-09-05 VITALS — BP 174/71 | HR 67 | Temp 97.9°F

## 2019-09-05 DIAGNOSIS — N184 Chronic kidney disease, stage 4 (severe): Secondary | ICD-10-CM | POA: Diagnosis not present

## 2019-09-05 LAB — IRON AND TIBC
Iron: 60 ug/dL (ref 28–170)
Saturation Ratios: 29 % (ref 10.4–31.8)
TIBC: 209 ug/dL — ABNORMAL LOW (ref 250–450)
UIBC: 149 ug/dL

## 2019-09-05 LAB — FERRITIN: Ferritin: 565 ng/mL — ABNORMAL HIGH (ref 11–307)

## 2019-09-05 LAB — POCT HEMOGLOBIN-HEMACUE: Hemoglobin: 11.9 g/dL — ABNORMAL LOW (ref 12.0–15.0)

## 2019-09-05 MED ORDER — EPOETIN ALFA-EPBX 10000 UNIT/ML IJ SOLN
10000.0000 [IU] | INTRAMUSCULAR | Status: DC
  Administered 2019-09-05: 10000 [IU] via SUBCUTANEOUS

## 2019-09-05 MED ORDER — EPOETIN ALFA-EPBX 10000 UNIT/ML IJ SOLN
INTRAMUSCULAR | Status: AC
  Filled 2019-09-05: qty 1

## 2019-09-10 ENCOUNTER — Other Ambulatory Visit: Payer: Self-pay | Admitting: Pharmacist

## 2019-09-11 ENCOUNTER — Telehealth: Payer: Self-pay

## 2019-09-11 NOTE — Telephone Encounter (Signed)
Received TC from shannon at [facility] requesting RX clarification on Warfarin RX which was sent yesterday by Dr. Elie Confer:  RX sent states to "take 1 tabl at 6pm on M,W, and F.  All other days take 1 tablet.  Please clarify and resend.  She has a hold on RX until she hears from you. Thank you, SChaplin, RN,BSN

## 2019-09-12 ENCOUNTER — Other Ambulatory Visit: Payer: Self-pay | Admitting: Pharmacist

## 2019-09-12 MED ORDER — WARFARIN SODIUM 5 MG PO TABS: ORAL_TABLET | ORAL | 2 refills | Status: DC

## 2019-09-17 ENCOUNTER — Ambulatory Visit (INDEPENDENT_AMBULATORY_CARE_PROVIDER_SITE_OTHER): Payer: Medicare Other | Admitting: Pharmacist

## 2019-09-17 ENCOUNTER — Encounter: Payer: Self-pay | Admitting: Internal Medicine

## 2019-09-17 ENCOUNTER — Ambulatory Visit (INDEPENDENT_AMBULATORY_CARE_PROVIDER_SITE_OTHER): Payer: Medicare Other | Admitting: Internal Medicine

## 2019-09-17 VITALS — BP 178/66 | HR 65 | Temp 98.7°F | Ht 65.0 in | Wt 128.5 lb

## 2019-09-17 DIAGNOSIS — Z7901 Long term (current) use of anticoagulants: Secondary | ICD-10-CM | POA: Diagnosis not present

## 2019-09-17 DIAGNOSIS — L720 Epidermal cyst: Secondary | ICD-10-CM | POA: Diagnosis not present

## 2019-09-17 DIAGNOSIS — Z86718 Personal history of other venous thrombosis and embolism: Secondary | ICD-10-CM | POA: Diagnosis not present

## 2019-09-17 DIAGNOSIS — Z5181 Encounter for therapeutic drug level monitoring: Secondary | ICD-10-CM

## 2019-09-17 LAB — POCT INR: INR: 2.2 (ref 2.0–3.0)

## 2019-09-17 NOTE — Assessment & Plan Note (Addendum)
[name] presents to the clinic with complaints of an inflamed area under her right breast that had been first noticed 3 days ago.  Her daughter-in-law is here at the visit with her.  [name] denies any fever, chills, tenderness in the affected region.  Her daughter-in-law notes that they noticed the area draining though.  On examination, there is a 1 cm indurated region with a central opening that measures 3 mm with cheesy, white drainage when pressed on.  With further pressure, a 6 mm long by 3 mm wide cheesy plug popped out.  Afterwards, the opening reduce to 1 mm.  Minimal yellow drainage can be seen at the bottom of the cyst but otherwise it is empty.  The cyst wall is well visualized.    This is most consistent with an epidermoid cyst that is only inflamed.  No surrounding erythema or puslike drainage to suggest infection. Given consistency of drainage and lack of fluctuance, this is unlikely to be an abscess.   Discussed that long-term resolution is dependent on cyst wall removal by surgery or dermatology.  Family would like to pursue that at this time.  Recommended warm compresses and keeping the area clean when at home.  Cyst was irrigated and cleaned with sterile saline and dressed with 2 x 2 gauze in the office.  Plan: -Referral to general surgery for cyst removal -Instructed to return in 1 week if no improvement

## 2019-09-17 NOTE — Patient Instructions (Addendum)
It was nice seeing you today! Thank you for choosing [facility] for your Primary Care.    Today we talked about:   1. Cyst: This is likely an epidermoid cyst that is inflamed. It does appear infected at this time, so I would not recommend antibiotics. If in 1 week, the area does not appear improved, please return to the clinic for re-evaluation. Warm compresses are recommended.

## 2019-09-17 NOTE — Patient Instructions (Addendum)
Patient instructed to take medications as defined in the Anti-coagulation Track section of this encounter.  Patient instructed to take today's dose.  Patient instructed to take  one and one-half tablet (1 & 1/2) of your 5mg  peach-colored warfarin tablet by mouth on Mondays, Wednesdays and Fridays.  All OTHER days--take only one (1) of  your 5mg  peach-colored warfarin tablets. Patient verbalized understanding of these instructions.

## 2019-09-17 NOTE — Progress Notes (Signed)
   CC: Cyst  HPI:  [name] with a PMHx of hypertension, dementia, CKD who presents to the clinic for test.   Please see the Encounters tab for problem-based Assessment & Plan.  Past Medical History:  Diagnosis Date  . Anemia     normocytic anemia with baseline hemoglobin 10-11  . Blindness of left eye     likely related to stroke, left cataract removed from that eye with complications  . Calculus gallbladder and bile duct with cholecystitis with obstruction 01/2015  . Chronic kidney disease 2006    left renal artery stenosis with probable hemodynamic significance, kidneys are normal in morphology without focal lesions or hydronephrosis this is based but cannot A. of the abdomen with and without contrast done on the 31st 2006  . CKD (chronic kidney disease), stage III   . CVA (cerebral vascular accident) ([place]) 1990's  . CVA (cerebrovascular accident) [facility])  October 2007    CT of the head atrophy with multiple remote insults noted but no definite acute findings  . Glaucoma   . Gout   . Hyperlipidemia   . Hypertension   . Personal history of DVT (deep vein thrombosis) 08/11/2010   BLE  . Type II diabetes mellitus ([place])    Review of Systems: Review of Systems  Constitutional: Negative for chills and fever.  Gastrointestinal: Negative for abdominal pain, diarrhea, nausea and vomiting.  Skin:       Draining cyst below right breast   Physical Exam:  Vitals:   09/17/19 1604  BP: (!) 178/66  Pulse: 65  Temp: 98.7 F (37.1 C)  TempSrc: Oral  SpO2: 100%  Weight: 128 lb 8 oz (58.3 kg)  Height: 5\' 5"  (1.651 m)   Physical Exam Vitals and nursing note reviewed.  Constitutional:      General: She is not in acute distress.    Appearance: She is normal weight.  Pulmonary:     Effort: Pulmonary effort is normal. No respiratory distress.  Skin:    General: Skin is warm and dry.          Comments: Cyst wall well defined and visualized See picture below    Neurological:     Mental Status: She is alert. Mental status is at baseline.  Psychiatric:        Mood and Affect: Mood normal.        Behavior: Behavior normal.      Assessment & Plan:   See Encounters Tab for problem based charting.  Patient discussed with Dr. Lynnae January

## 2019-09-17 NOTE — Progress Notes (Signed)
Anticoagulation Management Chelsea Mcdaniel is a 86 y.o. female who reports to the clinic for monitoring of warfarin treatment.    Indication: DVT, History of; Long term current use of anticoagulant.  Duration: indefinite Supervising physician: [facility] Visit History: Patient does not report signs/symptoms of bleeding or thromboembolism  Other recent changes: No diet, medications, lifestyle changes.  Anticoagulation Episode Summary    Current INR goal:  2.0-3.0  TTR:  65.8 % (5.6 y)  Next INR check:  10/15/2019  INR from last check:  2.2 (09/17/2019)  Weekly max warfarin dose:    Target end date:    INR check location:    Preferred lab:    Send INR reminders to:     Indications   EMBOLISM/THROMBOSIS DEEP VSL LWR EXTRM NOS (Resolved) [I82.409] Long-term (current) use of anticoagulants [Z79.01]       Comments:          Allergies  Allergen Reactions  . Diltiazem Other (See Comments)    Symptomatic bradaycardia  . Metoprolol Other (See Comments)    Symptomatic bradycardia    Current Outpatient Medications:  .  Accu-Chek Softclix Lancets lancets, Use to check blood sugar 3 times daily. DIAG CODE E11.9, Disp: 300 each, Rfl: 1 .  allopurinol (ZYLOPRIM) 100 MG tablet, TAKE 1/2 TABLET BY MOUTH EVERY OTHER DAY, Disp: 45 tablet, Rfl: 1 .  amLODipine (NORVASC) 10 MG tablet, TAKE 1 TABLET BY MOUTH EVERY DAY, Disp: 90 tablet, Rfl: 1 .  Blood Glucose Monitoring Suppl (ACCU-CHEK AVIVA PLUS) w/Device KIT, Check blood sugar 1 time a day, Disp: 1 kit, Rfl: 0 .  cloNIDine (CATAPRES - DOSED IN MG/24 HR) 0.3 mg/24hr patch, APPLY 1 PATCH TO SKIN EVERY 7 DAYS, Disp: 12 patch, Rfl: 1 .  dorzolamide-timolol (COSOPT) 22.3-6.8 MG/ML ophthalmic solution, Place 1 drop into both eyes 2 (two) times daily., Disp: , Rfl: 3 .  furosemide (LASIX) 10 MG/ML solution, Take '80mg'$  qam and '40mg'$  qpm, Disp: 120 mL, Rfl: 1 .  GAVILAX 17 GM/SCOOP powder, Take 17 grams by mouth 2 times  daily, Disp: 850 g, Rfl: 2 .  glucose blood (ACCU-CHEK AVIVA PLUS) test strip, USE 1 TIME DAILY TO CHECK BLOOD SUGAR, Disp: 100 each, Rfl: 3 .  JANUVIA 25 MG tablet, TAKE 1 TABLET BY MOUTH EVERY DAY, Disp: 90 tablet, Rfl: 1 .  Lancets Misc. (ACCU-CHEK SOFTCLIX LANCET DEV) KIT, Use to check blood sugar one time a day, Disp: 1 kit, Rfl: 2 .  lidocaine (LIDODERM) 5 %, Place 1 patch onto the skin daily. Remove & Discard patch within 12 hours or as directed by MD, Disp: 30 patch, Rfl: 0 .  LUMIGAN 0.01 % SOLN, Place 1 drop into both eyes at bedtime. , Disp: , Rfl:  .  Melatonin 1 MG/ML LIQD, TAKE 3.5 mls BY MOUTH AT BEDTIME AS NEEDED, Disp: 59 mL, Rfl: 0 .  polyethylene glycol (MIRALAX) packet, Take 17 g by mouth 2 (two) times daily., Disp: 180 packet, Rfl: 1 .  warfarin (COUMADIN) 5 MG tablet, TAKE ONE AND ONE-HALF TABLETS (1 & 1/2) BY MOUTH DAILY AT 6 PM ON MONDAYS, WEDNESDAYS AND FRIDAYS. ALL OTHER DAYS, TAKE ONLY ONE (1) TABLET., Disp: 36 tablet, Rfl: 2 .  glucose blood (ACCU-CHEK AVIVA) test strip, Use as instructed, Disp: 100 each, Rfl: 12 Past Medical History:  Diagnosis Date  . Anemia     normocytic anemia with baseline hemoglobin 10-11  . Blindness of left eye  likely related to stroke, left cataract removed from that eye with complications  . Calculus gallbladder and bile duct with cholecystitis with obstruction 01/2015  . Chronic kidney disease 2006    left renal artery stenosis with probable hemodynamic significance, kidneys are normal in morphology without focal lesions or hydronephrosis this is based but cannot A. of the abdomen with and without contrast done on the 31st 2006  . CKD (chronic kidney disease), stage III   . CVA (cerebral vascular accident) ([place]) 1990's  . CVA (cerebrovascular accident) [facility])  October 2007    CT of the head atrophy with multiple remote insults noted but no definite acute findings  . Glaucoma   . Gout   . Hyperlipidemia   . Hypertension   . Personal  history of DVT (deep vein thrombosis) 08/11/2010   BLE  . Type II diabetes mellitus ([place])    Social History   Socioeconomic History  . Marital status: Widowed    Spouse name: Not on file  . Number of children: Not on file  . Years of education: Not on file  . Highest education level: Not on file  Occupational History  . Not on file  Tobacco Use  . Smoking status: Never Smoker  . Smokeless tobacco: Never Used  Substance and Sexual Activity  . Alcohol use: No    Alcohol/week: 0.0 standard drinks  . Drug use: No  . Sexual activity: Not Currently  Other Topics Concern  . Not on file  Social History Narrative    Patient is a widow. She has 11 children 5 of who are living. She is retired in 1993 from [employer]. She denies tobacco alcohol or drug use.   Social Determinants of Health   Financial Resource Strain:   . Difficulty of Paying Living Expenses: Not on file  Food Insecurity:   . Worried About Charity fundraiser in the Last Year: Not on file  . Ran Out of Food in the Last Year: Not on file  Transportation Needs:   . Lack of Transportation (Medical): Not on file  . Lack of Transportation (Non-Medical): Not on file  Physical Activity:   . Days of Exercise per Week: Not on file  . Minutes of Exercise per Session: Not on file  Stress:   . Feeling of Stress : Not on file  Social Connections:   . Frequency of Communication with Friends and Family: Not on file  . Frequency of Social Gatherings with Friends and Family: Not on file  . Attends Religious Services: Not on file  . Active Member of Clubs or Organizations: Not on file  . Attends Archivist Meetings: Not on file  . Marital Status: Not on file   Family History  Problem Relation Age of Onset  . Heart disease Mother   . Diabetes Mother   . Hyperlipidemia Mother   . Hypertension Mother   . Heart disease Father   . Diabetes Father   . Hyperlipidemia Father   . Hypertension Father      ASSESSMENT Recent Results: The most recent result is correlated with 42.5 mg per week: Lab Results  Component Value Date   INR 2.2 09/17/2019   INR 1.5 08/27/2019   INR 2.9 07/30/2019    Anticoagulation Dosing: Description   Take one and one-half tablet (1 & 1/2) of your '5mg'$  peach-colored warfarin tablet by mouth on Mondays, Wednesdays and Fridays.  All OTHER days--take only one (1) of  your '5mg'$  peach-colored  warfarin tablets.      INR today: Therapeutic  PLAN Weekly dose was unchanged.   Patient Instructions  Patient instructed to take medications as defined in the Anti-coagulation Track section of this encounter.  Patient instructed to take today's dose.  Patient instructed to take  one and one-half tablet (1 & 1/2) of your '5mg'$  peach-colored warfarin tablet by mouth on Mondays, Wednesdays and Fridays.  All OTHER days--take only one (1) of  your '5mg'$  peach-colored warfarin tablets. Patient verbalized understanding of these instructions.    Patient advised to contact clinic or seek medical attention if signs/symptoms of bleeding or thromboembolism occur.  Patient verbalized understanding by repeating back information and was advised to contact me if further medication-related questions arise. Patient was also provided an information handout.  Follow-up Return in 4 weeks (on 10/15/2019) for Follow up INR.  Pennie Banter, PharmD, CPP  15 minutes spent face-to-face with the patient during the encounter. 50% of time spent on education, including signs/sx bleeding and clotting, as well as food and drug interactions with warfarin. 50% of time was spent on fingerprick POC INR sample collection,processing, results determination, and documentation in http://www.kim.net/.

## 2019-09-19 ENCOUNTER — Other Ambulatory Visit: Payer: Self-pay

## 2019-09-19 ENCOUNTER — Encounter (HOSPITAL_COMMUNITY)
Admission: RE | Admit: 2019-09-19 | Discharge: 2019-09-19 | Disposition: A | Discharge status: Home / Self Care | Payer: Medicare Other | Source: Ambulatory Visit | Attending: Nephrology | Admitting: Nephrology

## 2019-09-19 DIAGNOSIS — N184 Chronic kidney disease, stage 4 (severe): Secondary | ICD-10-CM | POA: Diagnosis not present

## 2019-09-19 LAB — POCT HEMOGLOBIN-HEMACUE: Hemoglobin: 12.1 g/dL (ref 12.0–15.0)

## 2019-09-21 NOTE — Progress Notes (Signed)
Internal Medicine Clinic Attending  Case discussed with Dr. Basaraba at the time of the visit.  We reviewed the resident's history and exam and pertinent patient test results.  I agree with the assessment, diagnosis, and plan of care documented in the resident's note.    

## 2019-10-02 ENCOUNTER — Other Ambulatory Visit: Payer: Self-pay | Admitting: Internal Medicine

## 2019-10-02 DIAGNOSIS — R296 Repeated falls: Secondary | ICD-10-CM | POA: Diagnosis not present

## 2019-10-02 DIAGNOSIS — N183 Chronic kidney disease, stage 3 unspecified: Secondary | ICD-10-CM | POA: Diagnosis not present

## 2019-10-02 DIAGNOSIS — G4700 Insomnia, unspecified: Secondary | ICD-10-CM

## 2019-10-03 ENCOUNTER — Other Ambulatory Visit: Payer: Self-pay

## 2019-10-03 ENCOUNTER — Ambulatory Visit (HOSPITAL_COMMUNITY)
Admission: RE | Admit: 2019-10-03 | Discharge: 2019-10-03 | Disposition: A | Discharge status: Home / Self Care | Payer: Medicare Other | Source: Ambulatory Visit | Attending: Nephrology | Admitting: Nephrology

## 2019-10-03 ENCOUNTER — Other Ambulatory Visit: Payer: Self-pay | Admitting: Internal Medicine

## 2019-10-03 VITALS — BP 177/69 | HR 67 | Temp 97.7°F | Resp 20

## 2019-10-03 DIAGNOSIS — N184 Chronic kidney disease, stage 4 (severe): Secondary | ICD-10-CM | POA: Diagnosis not present

## 2019-10-03 DIAGNOSIS — G4700 Insomnia, unspecified: Secondary | ICD-10-CM

## 2019-10-03 LAB — IRON AND TIBC
Iron: 40 ug/dL (ref 28–170)
Saturation Ratios: 22 % (ref 10.4–31.8)
TIBC: 179 ug/dL — ABNORMAL LOW (ref 250–450)
UIBC: 139 ug/dL

## 2019-10-03 LAB — FERRITIN: Ferritin: 726 ng/mL — ABNORMAL HIGH (ref 11–307)

## 2019-10-03 LAB — POCT HEMOGLOBIN-HEMACUE: Hemoglobin: 11.4 g/dL — ABNORMAL LOW (ref 12.0–15.0)

## 2019-10-03 MED ORDER — EPOETIN ALFA-EPBX 10000 UNIT/ML IJ SOLN: 10000.0000 [IU] | INTRAMUSCULAR | Status: DC

## 2019-10-03 MED ORDER — EPOETIN ALFA-EPBX 10000 UNIT/ML IJ SOLN
INTRAMUSCULAR | Status: AC
  Administered 2019-10-03: 10000 [IU]
  Filled 2019-10-03: qty 1

## 2019-10-09 ENCOUNTER — Other Ambulatory Visit: Payer: Self-pay | Admitting: Internal Medicine

## 2019-10-10 ENCOUNTER — Other Ambulatory Visit: Payer: Medicare Other | Admitting: Hospice

## 2019-10-10 ENCOUNTER — Other Ambulatory Visit: Payer: Self-pay

## 2019-10-10 DIAGNOSIS — Z515 Encounter for palliative care: Secondary | ICD-10-CM

## 2019-10-10 DIAGNOSIS — F0150 Vascular dementia without behavioral disturbance: Secondary | ICD-10-CM

## 2019-10-10 NOTE — Progress Notes (Signed)
[facility] Consult Note Telephone: [phone]  Fax: [phone]  PATIENT NAME: Chelsea Mcdaniel DOB: 02/07/1928 MRN: [id number]  PRIMARY CARE PROVIDER:   [name], MD  REFERRING PROVIDER:Michelle Schmerge, NP  RESPONSIBLE PARTY:Georgeanna Markson - daughter in law [place] is the caregiver TELEHEALTH VISIT STATEMENT Due to the COVID-19 crisis, this visit was done via telephone from my office. It was initiated and consented to by this patient and/or family.  RECOMMENDATIONS/PLAN:  Advance Care Planning/Goals of Care:Telehealth visit consisted of building trust and following up on palliative care. Patient remains a DNR with goals of care including to maximize quality of life and symptom relief. In person visit scheduled for next month to review MOST form at home.  Symptom management:Patient at baseline withmemory loss r/t Dementia, patient now FAST 6d from FAST 6c the last visit, in line with Dementia disease trajectory. Patient is incontinent of bladder and bowel;  ambulatory with a rolling walker, needs assistance with ADLs, feeds self after set up; now prefers finger foods.  Patient denies pain/discomfort.  Caregiver Freda Munro concerned that patient not getting enough sleep at night because of sundowning, agitation. '1mg'$  melatonin at bedtime no longer effective.  Sent message to PCP via Epic chat recommending increasing Melatonin to '2mg'$  and adding Seroquel '25mg'$  at bedtime. Patient continues on Lasix for edema; encouraged use of ted hose and elevation of BLE. Called Gerogeanna and updated her on visit. Encouraged ongoing supportive care. Follow BP:ZWCHENIDPO care will continue to follow patient for goals of care clarification and symptom management. I spent50mnutes providing this consultation.  More than 50% of the time in this consultation was spent coordinating communication.  HISTORY OF  PRESENT ILLNESS:Karys M Womackis a 86y.o.year oldfemalewith multiple medical problems including Dementia, CKD III, Type 2 DM. Palliative Care was asked to help address goals of care. CODE STATUS: DNR  PPS: 50% HOSPICE ELIGIBILITY/DIAGNOSIS: TBD  PAST MEDICAL HISTORY:  Past Medical History:  Diagnosis Date  . Anemia     normocytic anemia with baseline hemoglobin 10-11  . Blindness of left eye     likely related to stroke, left cataract removed from that eye with complications  . Calculus gallbladder and bile duct with cholecystitis with obstruction 01/2015  . Chronic kidney disease 2006    left renal artery stenosis with probable hemodynamic significance, kidneys are normal in morphology without focal lesions or hydronephrosis this is based but cannot A. of the abdomen with and without contrast done on the 31st 2006  . CKD (chronic kidney disease), stage III   . [place]  . [facility]A (cerebrovascular accident) ([facility]  October 2007    CT of the head atrophy with multiple remote insults noted but no definite acute findings  . Glaucoma   . Gout   . Hyperlipidemia   . Hypertension   . Personal history of DVT (deep vein thrombosis) 08/11/2010   BLE  . [place]     SOCIAL HX:  Social History   Tobacco Use  . Smoking status: Never Smoker  . Smokeless tobacco: Never Used  Substance Use Topics  . Alcohol use: No    Alcohol/week: 0.0 standard drinks    ALLERGIES:  Allergies  Allergen Reactions  . Diltiazem Other (See Comments)    Symptomatic bradaycardia  . Metoprolol Other (See Comments)    Symptomatic bradycardia     PERTINENT MEDICATIONS:  Outpatient Encounter Medications  as of 10/10/2019  Medication Sig  . Accu-Chek Softclix Lancets lancets Use to check blood sugar 3 times daily. DIAG CODE E11.9  . allopurinol (ZYLOPRIM) 100 MG tablet TAKE 1/2 TABLET BY MOUTH EVERY OTHER DAY  . amLODipine (NORVASC) 10 MG tablet  TAKE 1 TABLET BY MOUTH EVERY DAY  . Blood Glucose Monitoring Suppl (ACCU-CHEK AVIVA PLUS) w/Device KIT Check blood sugar 1 time a day  . cloNIDine (CATAPRES - DOSED IN MG/24 HR) 0.3 mg/24hr patch APPLY 1 PATCH TO SKIN EVERY 7 DAYS  . dorzolamide-timolol (COSOPT) 22.3-6.8 MG/ML ophthalmic solution Place 1 drop into both eyes 2 (two) times daily.  . furosemide (LASIX) 10 MG/ML solution Take '80mg'$  qam and '40mg'$  qpm  . GAVILAX 17 GM/SCOOP powder Take 17 grams by mouth 2 times daily  . glucose blood (ACCU-CHEK AVIVA PLUS) test strip USE 1 TIME DAILY TO CHECK BLOOD SUGAR  . glucose blood (ACCU-CHEK AVIVA) test strip Use as instructed  . JANUVIA 25 MG tablet TAKE 1 TABLET BY MOUTH EVERY DAY  . Lancets Misc. (ACCU-CHEK SOFTCLIX LANCET DEV) KIT Use to check blood sugar one time a day  . lidocaine (LIDODERM) 5 % Place 1 patch onto the skin daily. Remove & Discard patch within 12 hours or as directed by MD  . LUMIGAN 0.01 % SOLN Place 1 drop into both eyes at bedtime.   . Melatonin 1 MG/ML LIQD TAKE 3.5 mls BY MOUTH AT BEDTIME AS NEEDED  . polyethylene glycol (MIRALAX) packet Take 17 g by mouth 2 (two) times daily.  Marland Kitchen warfarin (COUMADIN) 5 MG tablet TAKE ONE AND ONE-HALF TABLETS (1 & 1/2) BY MOUTH DAILY AT 6 PM ON MONDAYS, WEDNESDAYS AND FRIDAYS. ALL OTHER DAYS, TAKE ONLY ONE (1) TABLET.   No facility-administered encounter medications on file as of 10/10/2019.    Teodoro Spray, NP

## 2019-10-11 ENCOUNTER — Ambulatory Visit (INDEPENDENT_AMBULATORY_CARE_PROVIDER_SITE_OTHER): Payer: Medicare Other | Admitting: Internal Medicine

## 2019-10-11 ENCOUNTER — Other Ambulatory Visit: Payer: Self-pay

## 2019-10-11 ENCOUNTER — Encounter: Payer: Self-pay | Admitting: Internal Medicine

## 2019-10-11 ENCOUNTER — Telehealth: Payer: Self-pay

## 2019-10-11 VITALS — BP 176/81 | HR 71 | Temp 97.5°F | Ht 65.0 in | Wt 131.1 lb

## 2019-10-11 DIAGNOSIS — F0390 Unspecified dementia without behavioral disturbance: Secondary | ICD-10-CM | POA: Diagnosis not present

## 2019-10-11 DIAGNOSIS — F5105 Insomnia due to other mental disorder: Secondary | ICD-10-CM

## 2019-10-11 DIAGNOSIS — F99 Mental disorder, not otherwise specified: Secondary | ICD-10-CM

## 2019-10-11 NOTE — Telephone Encounter (Signed)
Received TC from Pt's DIL, Georgianna.  She states pt is very restless, not sleeping well at night and up during the day.  States her dementia is getting worse.  Pt is scheduled for a telehealth appt in [facility] today at 1:15, but DIL wants an in-person appt.  Appt changed to in person per DIL's request. SChaplin, RN,BSN

## 2019-10-11 NOTE — Patient Instructions (Signed)
FOLLOW-UP INSTRUCTIONS When: As needed  Today we discussed your insomnia. There are few approved medications with benefit in dementia all of which are recommended after adjusting for the following sleep hygiene issues as below. Additionally, I strongly recommend [facility] as the daily activities will substitute for most of the sleep hygiene concerns listed below as well.  Recommend the following: Practice Good Sleep Hygiene Here are some suggestions Avoid napping during the day. It can disturb the normal pattern of sleep and wakefulness.  Avoid stimulants such as caffeine, nicotine, and alcohol too close to bedtime. While alcohol is well known to speed the onset of sleep, it disrupts sleep in the second half as the body begins to metabolize the alcohol, causing arousal.  Exercise can promote good sleep. Vigorous exercise should be taken in the morning or late afternoon. A relaxing exercise, like yoga, can be done before bed to help initiate a restful night's sleep. Food can be disruptive right before sleep. Stay away from large meals close to bedtime. Also dietary changes can cause sleep problems, if someone is struggling with a sleep problem, it's not a good time to start experimenting with spicy dishes. And, remember, chocolate has caffeine.  Ensure adequate exposure to natural light. This is particularly important for older people who may not venture outside as frequently as children and adults. Light exposure helps maintain a healthy sleep-wake cycle.  Establish a regular relaxing bedtime routine. Try to avoid emotionally upsetting conversations and activities before trying to go to sleep. Don't dwell on, or bring your problems to bed.  Associate your bed with sleep. It's not a good idea to use your bed to watch TV, listen to the radio, or read.  Make sure that the sleep environment is pleasant and relaxing. The bed should be comfortable, the room should not be too hot or cold, or too bright.  Thank  you for your visit to the [facility] today. If you have any questions or concerns please call us at [phone].

## 2019-10-11 NOTE — Assessment & Plan Note (Signed)
Insomnia related to dementia: At least 6 months of difficulty sleeping throughout the night. She takes naps throughout the day and night. The nighttime caretaker, who is not present today, endorses that she has difficulty sleeping at least 3/7 nights. Patient does not exercise, does not walk, does not have hobbies or a routine. She is allowed to proceed at her own pace with what ever she wishes to do daily.  The family had considered  [facility] as an option in the past but the patient was initially resistant after she toured the facility.  She appeared overwhelmed per her daughter during the tour.  The patient appears to have appropriate restorative sleep when she sleeps.  The TV is typically left on 24/7, she drinks Pepsi throughout the day, she is not active throughout the day.  Her medication list does not include any particular meds linked with insomnia in underlying dementia that I am aware of.  She does not display characteristic features of delirium. Evaluated for underlying causes. Sleep Hygiene discussion occurred.  Based on my brief review of the data sleep hygiene activities other recommended therapy for this type of sleep difficulty.   Plan: I recommend the patient reconsider [facility] as I feel this would solve most of her sleep hygiene issues for the majority of the day.  The patient agrees, at least at this time, to visit [facility]. Recommend daily exercise Recommend sleep hygiene information given as below: Practice Good Sleep Hygiene Here are some suggestions Avoid napping during the day. It can disturb the normal pattern of sleep and wakefulness.  Avoid stimulants such as caffeine, nicotine, and alcohol too close to bedtime. While alcohol is well known to speed the onset of sleep, it disrupts sleep in the second half as the body begins to metabolize the alcohol, causing arousal.  Exercise can promote good sleep. Vigorous exercise should be taken in the morning or late afternoon. A  relaxing exercise, like yoga, can be done before bed to help initiate a restful night's sleep. Food can be disruptive right before sleep. Stay away from large meals close to bedtime. Also dietary changes can cause sleep problems, if someone is struggling with a sleep problem, it's not a good time to start experimenting with spicy dishes. And, remember, chocolate has caffeine.  Ensure adequate exposure to natural light. This is particularly important for older people who may not venture outside as frequently as children and adults. Light exposure helps maintain a healthy sleep-wake cycle.  Establish a regular relaxing bedtime routine. Try to avoid emotionally upsetting conversations and activities before trying to go to sleep. Don't dwell on, or bring your problems to bed. Associate your bed with sleep. It's not a good idea to use your bed to watch TV, listen to the radio, or read.  Make sure that the sleep environment is pleasant and relaxing. The bed should be comfortable, the room should not be too hot or cold, or too bright.

## 2019-10-11 NOTE — Progress Notes (Signed)
   CC: insomnia   HPI:[name] female who presents for evaluation of insomnia. Please see individual problem based A/P for details.  Past Medical History:  Diagnosis Date  . Anemia     normocytic anemia with baseline hemoglobin 10-11  . Blindness of left eye     likely related to stroke, left cataract removed from that eye with complications  . Calculus gallbladder and bile duct with cholecystitis with obstruction 01/2015  . Chronic kidney disease 2006    left renal artery stenosis with probable hemodynamic significance, kidneys are normal in morphology without focal lesions or hydronephrosis this is based but cannot A. of the abdomen with and without contrast done on the 31st 2006  . CKD (chronic kidney disease), stage III   . CVA (cerebral vascular accident) ([place]) 1990's  . CVA (cerebrovascular accident) [facility])  October 2007    CT of the head atrophy with multiple remote insults noted but no definite acute findings  . Glaucoma   . Gout   . Hyperlipidemia   . Hypertension   . Personal history of DVT (deep vein thrombosis) 08/11/2010   BLE  . Type II diabetes mellitus ([place])    Review of Systems:  ROS negative except as per HPI.  Physical Exam: Vitals:   10/11/19 1318  BP: (!) 176/81  Pulse: 71  Temp: (!) 97.5 F (36.4 C)  TempSrc: Oral  SpO2: 99%  Weight: 131 lb 1.6 oz (59.5 kg)  Height: 5\' 5"  (1.651 m)   Filed Weights   10/11/19 1318  Weight: 131 lb 1.6 oz (59.5 kg)   General: A/O x4, in no acute distress, afebrile, nondiaphoretic HEENT: PEERL, EMO intact Cardio: RRR, no mrg's  Pulmonary: CTA bilaterally, no wheezing or crackles  Neuro: Alert, normal gait Psych: Appropriate affect, not depressed in appearance, engages well, coherent   Assessment & Plan:   See Encounters Tab for problem based charting.  Patient discussed with Dr. Daryll Drown

## 2019-10-15 ENCOUNTER — Ambulatory Visit (INDEPENDENT_AMBULATORY_CARE_PROVIDER_SITE_OTHER): Payer: Medicare Other | Admitting: Pharmacist

## 2019-10-15 DIAGNOSIS — Z5181 Encounter for therapeutic drug level monitoring: Secondary | ICD-10-CM

## 2019-10-15 DIAGNOSIS — Z7901 Long term (current) use of anticoagulants: Secondary | ICD-10-CM | POA: Diagnosis not present

## 2019-10-15 DIAGNOSIS — Z86718 Personal history of other venous thrombosis and embolism: Secondary | ICD-10-CM

## 2019-10-15 LAB — POCT INR: INR: 3.5 — AB (ref 2.0–3.0)

## 2019-10-15 NOTE — Patient Instructions (Signed)
Patient instructed to take medications as defined in the Anti-coagulation Track section of this encounter.  Patient instructed to take today's dose.  Patient instructed to take one (1) tablet of your 5mg  peach-colored warfarin tablets once-daily at [facility] each day.  Patient verbalized understanding of these instructions.

## 2019-10-15 NOTE — Progress Notes (Signed)
Anticoagulation Management Chelsea Mcdaniel is a 86 y.o. female who reports to the clinic for monitoring of warfarin treatment.    Indication: DVT  , History of (resolved); Long term current use of warfarin/anticoagulant.   Duration: indefinite Supervising physician: Larey Dresser, MD Anticoagulation Clinic Visit History: Patient does not report any symptoms of bleeding or thromboembolism  Other recent changes: No diet, medications, lifestyle changes reported by her daughter-in-law who accompanies her.  Anticoagulation Episode Summary    Current INR goal:  2.0-3.0  TTR:  65.7 % (5.7 y)  Next INR check:  11/05/2019  INR from last check:  3.5 (10/15/2019)  Weekly max warfarin dose:    Target end date:    INR check location:    Preferred lab:    Send INR reminders to:     Indications   EMBOLISM/THROMBOSIS DEEP VSL LWR EXTRM NOS (Resolved) [I82.409] Long-term (current) use of anticoagulants [Z79.01]       Comments:          Allergies  Allergen Reactions  . Diltiazem Other (See Comments)    Symptomatic bradaycardia  . Metoprolol Other (See Comments)    Symptomatic bradycardia    Current Outpatient Medications:  .  Accu-Chek Softclix Lancets lancets, Use to check blood sugar 3 times daily. DIAG CODE E11.9, Disp: 300 each, Rfl: 1 .  allopurinol (ZYLOPRIM) 100 MG tablet, TAKE 1/2 TABLET BY MOUTH EVERY OTHER DAY, Disp: 45 tablet, Rfl: 1 .  amLODipine (NORVASC) 10 MG tablet, TAKE 1 TABLET BY MOUTH EVERY DAY, Disp: 90 tablet, Rfl: 1 .  Blood Glucose Monitoring Suppl (ACCU-CHEK AVIVA PLUS) w/Device KIT, Check blood sugar 1 time a day, Disp: 1 kit, Rfl: 0 .  cloNIDine (CATAPRES - DOSED IN MG/24 HR) 0.3 mg/24hr patch, APPLY 1 PATCH TO SKIN EVERY 7 DAYS, Disp: 12 patch, Rfl: 1 .  dorzolamide-timolol (COSOPT) 22.3-6.8 MG/ML ophthalmic solution, Place 1 drop into both eyes 2 (two) times daily., Disp: , Rfl: 3 .  furosemide (LASIX) 10 MG/ML solution, Take '80mg'$  qam and '40mg'$  qpm, Disp:  120 mL, Rfl: 1 .  GAVILAX 17 GM/SCOOP powder, Take 17 grams by mouth 2 times daily, Disp: 850 g, Rfl: 2 .  glucose blood (ACCU-CHEK AVIVA PLUS) test strip, USE 1 TIME DAILY TO CHECK BLOOD SUGAR, Disp: 100 each, Rfl: 3 .  JANUVIA 25 MG tablet, TAKE 1 TABLET BY MOUTH EVERY DAY, Disp: 90 tablet, Rfl: 1 .  Lancets Misc. (ACCU-CHEK SOFTCLIX LANCET DEV) KIT, Use to check blood sugar one time a day, Disp: 1 kit, Rfl: 2 .  lidocaine (LIDODERM) 5 %, Place 1 patch onto the skin daily. Remove & Discard patch within 12 hours or as directed by MD, Disp: 30 patch, Rfl: 0 .  LUMIGAN 0.01 % SOLN, Place 1 drop into both eyes at bedtime. , Disp: , Rfl:  .  Melatonin 1 MG/ML LIQD, TAKE 3.5 mls BY MOUTH AT BEDTIME AS NEEDED, Disp: 58 mL, Rfl: 1 .  polyethylene glycol (MIRALAX) packet, Take 17 g by mouth 2 (two) times daily., Disp: 180 packet, Rfl: 1 .  warfarin (COUMADIN) 5 MG tablet, TAKE ONE AND ONE-HALF TABLETS (1 & 1/2) BY MOUTH DAILY AT 6 PM ON MONDAYS, WEDNESDAYS AND FRIDAYS. ALL OTHER DAYS, TAKE ONLY ONE (1) TABLET., Disp: 36 tablet, Rfl: 2 .  glucose blood (ACCU-CHEK AVIVA) test strip, Use as instructed, Disp: 100 each, Rfl: 12 Past Medical History:  Diagnosis Date  . Anemia     normocytic anemia with baseline  hemoglobin 10-11  . Blindness of left eye     likely related to stroke, left cataract removed from that eye with complications  . Calculus gallbladder and bile duct with cholecystitis with obstruction 01/2015  . Chronic kidney disease 2006    left renal artery stenosis with probable hemodynamic significance, kidneys are normal in morphology without focal lesions or hydronephrosis this is based but cannot A. of the abdomen with and without contrast done on the 31st 2006  . CKD (chronic kidney disease), stage III   . CVA (cerebral vascular accident) ([place]) 1990's  . CVA (cerebrovascular accident) [facility])  October 2007    CT of the head atrophy with multiple remote insults noted but no definite acute  findings  . Glaucoma   . Gout   . Hyperlipidemia   . Hypertension   . Personal history of DVT (deep vein thrombosis) 08/11/2010   BLE  . Type II diabetes mellitus ([place])    Social History   Socioeconomic History  . Marital status: Widowed    Spouse name: Not on file  . Number of children: Not on file  . Years of education: Not on file  . Highest education level: Not on file  Occupational History  . Not on file  Tobacco Use  . Smoking status: Never Smoker  . Smokeless tobacco: Never Used  Substance and Sexual Activity  . Alcohol use: No    Alcohol/week: 0.0 standard drinks  . Drug use: No  . Sexual activity: Not Currently  Other Topics Concern  . Not on file  Social History Narrative    Patient is a widow. She has 11 children 5 of who are living. She is retired in 1993 from [employer]. She denies tobacco alcohol or drug use.   Social Determinants of Health   Financial Resource Strain:   . Difficulty of Paying Living Expenses:   Food Insecurity:   . Worried About Charity fundraiser in the Last Year:   . Arboriculturist in the Last Year:   Transportation Needs:   . Film/video editor (Medical):   Marland Kitchen Lack of Transportation (Non-Medical):   Physical Activity:   . Days of Exercise per Week:   . Minutes of Exercise per Session:   Stress:   . Feeling of Stress :   Social Connections:   . Frequency of Communication with Friends and Family:   . Frequency of Social Gatherings with Friends and Family:   . Attends Religious Services:   . Active Member of Clubs or Organizations:   . Attends Archivist Meetings:   Marland Kitchen Marital Status:    Family History  Problem Relation Age of Onset  . Heart disease Mother   . Diabetes Mother   . Hyperlipidemia Mother   . Hypertension Mother   . Heart disease Father   . Diabetes Father   . Hyperlipidemia Father   . Hypertension Father     ASSESSMENT Recent Results: The most recent result is correlated with 37.5 mg per  week: Lab Results  Component Value Date   INR 3.5 (A) 10/15/2019   INR 2.2 09/17/2019   INR 1.5 08/27/2019    Anticoagulation Dosing: Description   Take one (1)  of your '5mg'$  peach-colored warfarin tablet by mouth daily.      INR today: Supratherapeutic  PLAN Weekly dose was decreased by 7% to 35 mg per week  Patient Instructions  Patient instructed to take medications as defined in the Anti-coagulation  Track section of this encounter.  Patient instructed to take today's dose.  Patient instructed to take one (1) tablet of your '5mg'$  peach-colored warfarin tablets once-daily at [facility] each day.  Patient verbalized understanding of these instructions.    Patient advised to contact clinic or seek medical attention if signs/symptoms of bleeding or thromboembolism occur.  Patient verbalized understanding by repeating back information and was advised to contact me if further medication-related questions arise. Patient was also provided an information handout.  Follow-up Return in 3 weeks (on 11/05/2019) for Follow up INR.  Pennie Banter, PharmD, CPP  15 minutes spent face-to-face with the patient during the encounter. 50% of time spent on education, including signs/sx bleeding and clotting, as well as food and drug interactions with warfarin. 50% of time was spent on fingerprick POC INR sample collection,processing, results determination, and documentation in [email or web address]

## 2019-10-16 NOTE — Progress Notes (Signed)
Internal Medicine Clinic Attending  Case discussed with Dr. Harbrecht at the time of the visit.  We reviewed the resident's history and exam and pertinent patient test results.  I agree with the assessment, diagnosis, and plan of care documented in the resident's note.   

## 2019-10-17 ENCOUNTER — Encounter (HOSPITAL_COMMUNITY)
Admission: RE | Admit: 2019-10-17 | Discharge: 2019-10-17 | Disposition: A | Discharge status: Home / Self Care | Payer: Medicare Other | Source: Ambulatory Visit | Attending: Nephrology | Admitting: Nephrology

## 2019-10-17 VITALS — BP 179/64 | HR 66 | Resp 20

## 2019-10-17 DIAGNOSIS — N184 Chronic kidney disease, stage 4 (severe): Secondary | ICD-10-CM

## 2019-10-17 LAB — POCT HEMOGLOBIN-HEMACUE: Hemoglobin: 11.3 g/dL — ABNORMAL LOW (ref 12.0–15.0)

## 2019-10-17 MED ORDER — EPOETIN ALFA-EPBX 10000 UNIT/ML IJ SOLN
INTRAMUSCULAR | Status: AC
  Filled 2019-10-17: qty 1

## 2019-10-17 MED ORDER — EPOETIN ALFA-EPBX 10000 UNIT/ML IJ SOLN
10000.0000 [IU] | INTRAMUSCULAR | Status: DC
  Administered 2019-10-17: 10000 [IU] via SUBCUTANEOUS

## 2019-10-22 ENCOUNTER — Telehealth: Payer: Self-pay

## 2019-10-22 NOTE — Telephone Encounter (Signed)
Mendel Ryder with [facility] requesting to speak with a nurse about dementia med. Please call back.

## 2019-10-31 ENCOUNTER — Other Ambulatory Visit: Payer: Self-pay

## 2019-10-31 ENCOUNTER — Encounter (HOSPITAL_COMMUNITY)
Admission: RE | Admit: 2019-10-31 | Discharge: 2019-10-31 | Disposition: A | Discharge status: Home / Self Care | Payer: Medicare Other | Source: Ambulatory Visit | Attending: Nephrology | Admitting: Nephrology

## 2019-10-31 VITALS — BP 152/65 | HR 69 | Temp 97.6°F | Resp 20

## 2019-10-31 DIAGNOSIS — N184 Chronic kidney disease, stage 4 (severe): Secondary | ICD-10-CM | POA: Diagnosis not present

## 2019-10-31 LAB — IRON AND TIBC
Iron: 75 ug/dL (ref 28–170)
Saturation Ratios: 37 % — ABNORMAL HIGH (ref 10.4–31.8)
TIBC: 200 ug/dL — ABNORMAL LOW (ref 250–450)
UIBC: 125 ug/dL

## 2019-10-31 LAB — FERRITIN: Ferritin: 683 ng/mL — ABNORMAL HIGH (ref 11–307)

## 2019-10-31 LAB — POCT HEMOGLOBIN-HEMACUE: Hemoglobin: 12.3 g/dL (ref 12.0–15.0)

## 2019-10-31 MED ORDER — EPOETIN ALFA-EPBX 10000 UNIT/ML IJ SOLN: 10000.0000 [IU] | INTRAMUSCULAR | Status: DC

## 2019-10-31 MED ORDER — CLONIDINE HCL 0.1 MG PO TABS: 0.1000 mg | ORAL_TABLET | Freq: Once | ORAL | Status: DC | PRN

## 2019-11-02 DIAGNOSIS — R296 Repeated falls: Secondary | ICD-10-CM | POA: Diagnosis not present

## 2019-11-02 DIAGNOSIS — N183 Chronic kidney disease, stage 3 unspecified: Secondary | ICD-10-CM | POA: Diagnosis not present

## 2019-11-05 ENCOUNTER — Ambulatory Visit (INDEPENDENT_AMBULATORY_CARE_PROVIDER_SITE_OTHER): Payer: Medicare Other | Admitting: Pharmacist

## 2019-11-05 DIAGNOSIS — Z5181 Encounter for therapeutic drug level monitoring: Secondary | ICD-10-CM

## 2019-11-05 DIAGNOSIS — Z7901 Long term (current) use of anticoagulants: Secondary | ICD-10-CM

## 2019-11-05 DIAGNOSIS — Z86718 Personal history of other venous thrombosis and embolism: Secondary | ICD-10-CM | POA: Diagnosis not present

## 2019-11-05 LAB — POCT INR: INR: 2.9 (ref 2.0–3.0)

## 2019-11-05 NOTE — Progress Notes (Signed)
Anticoagulation Management Chelsea Mcdaniel is a 86 y.o. female who reports to the clinic for monitoring of warfarin treatment.    Indication: DVT, History of (resolved); Long term current use of anticoagulant.   Duration: indefinite Supervising physician: [place] [facility] Visit History: Patient does not report signs/symptoms of bleeding or thromboembolism  Other recent changes: No diet, medications, lifestyle changes endorsed at this visit by her daughter in-law who accompanies her.  Anticoagulation Episode Summary    Current INR goal:  2.0-3.0  TTR:  65.2 % (5.7 y)  Next INR check:  12/03/2019  INR from last check:  2.9 (11/05/2019)  Weekly max warfarin dose:    Target end date:    INR check location:    Preferred lab:    Send INR reminders to:     Indications   EMBOLISM/THROMBOSIS DEEP VSL LWR EXTRM NOS (Resolved) [I82.409] Long-term (current) use of anticoagulants [Z79.01]       Comments:          Allergies  Allergen Reactions  . Diltiazem Other (See Comments)    Symptomatic bradaycardia  . Metoprolol Other (See Comments)    Symptomatic bradycardia    Current Outpatient Medications:  .  Accu-Chek Softclix Lancets lancets, Use to check blood sugar 3 times daily. DIAG CODE E11.9, Disp: 300 each, Rfl: 1 .  allopurinol (ZYLOPRIM) 100 MG tablet, TAKE 1/2 TABLET BY MOUTH EVERY OTHER DAY, Disp: 45 tablet, Rfl: 1 .  amLODipine (NORVASC) 10 MG tablet, TAKE 1 TABLET BY MOUTH EVERY DAY, Disp: 90 tablet, Rfl: 1 .  Blood Glucose Monitoring Suppl (ACCU-CHEK AVIVA PLUS) w/Device KIT, Check blood sugar 1 time a day, Disp: 1 kit, Rfl: 0 .  cloNIDine (CATAPRES - DOSED IN MG/24 HR) 0.3 mg/24hr patch, APPLY 1 PATCH TO SKIN EVERY 7 DAYS, Disp: 12 patch, Rfl: 1 .  dorzolamide-timolol (COSOPT) 22.3-6.8 MG/ML ophthalmic solution, Place 1 drop into both eyes 2 (two) times daily., Disp: , Rfl: 3 .  furosemide (LASIX) 10 MG/ML solution, Take '80mg'$  qam and '40mg'$  qpm, Disp:  120 mL, Rfl: 1 .  GAVILAX 17 GM/SCOOP powder, Take 17 grams by mouth 2 times daily, Disp: 850 g, Rfl: 2 .  glucose blood (ACCU-CHEK AVIVA PLUS) test strip, USE 1 TIME DAILY TO CHECK BLOOD SUGAR, Disp: 100 each, Rfl: 3 .  JANUVIA 25 MG tablet, TAKE 1 TABLET BY MOUTH EVERY DAY, Disp: 90 tablet, Rfl: 1 .  Lancets Misc. (ACCU-CHEK SOFTCLIX LANCET DEV) KIT, Use to check blood sugar one time a day, Disp: 1 kit, Rfl: 2 .  lidocaine (LIDODERM) 5 %, Place 1 patch onto the skin daily. Remove & Discard patch within 12 hours or as directed by MD, Disp: 30 patch, Rfl: 0 .  LUMIGAN 0.01 % SOLN, Place 1 drop into both eyes at bedtime. , Disp: , Rfl:  .  Melatonin 1 MG/ML LIQD, TAKE 3.5 mls BY MOUTH AT BEDTIME AS NEEDED, Disp: 58 mL, Rfl: 1 .  polyethylene glycol (MIRALAX) packet, Take 17 g by mouth 2 (two) times daily., Disp: 180 packet, Rfl: 1 .  warfarin (COUMADIN) 5 MG tablet, TAKE ONE AND ONE-HALF TABLETS (1 & 1/2) BY MOUTH DAILY AT 6 PM ON MONDAYS, WEDNESDAYS AND FRIDAYS. ALL OTHER DAYS, TAKE ONLY ONE (1) TABLET., Disp: 36 tablet, Rfl: 2 .  glucose blood (ACCU-CHEK AVIVA) test strip, Use as instructed, Disp: 100 each, Rfl: 12 Past Medical History:  Diagnosis Date  . Anemia     normocytic anemia with  baseline hemoglobin 10-11  . Blindness of left eye     likely related to stroke, left cataract removed from that eye with complications  . Calculus gallbladder and bile duct with cholecystitis with obstruction 01/2015  . Chronic kidney disease 2006    left renal artery stenosis with probable hemodynamic significance, kidneys are normal in morphology without focal lesions or hydronephrosis this is based but cannot A. of the abdomen with and without contrast done on the 31st 2006  . CKD (chronic kidney disease), stage III   . CVA (cerebral vascular accident) ([place]) 1990's  . CVA (cerebrovascular accident) [facility])  October 2007    CT of the head atrophy with multiple remote insults noted but no definite acute  findings  . Glaucoma   . Gout   . Hyperlipidemia   . Hypertension   . Personal history of DVT (deep vein thrombosis) 08/11/2010   BLE  . Type II diabetes mellitus ([place])    Social History   Socioeconomic History  . Marital status: Widowed    Spouse name: Not on file  . Number of children: Not on file  . Years of education: Not on file  . Highest education level: Not on file  Occupational History  . Not on file  Tobacco Use  . Smoking status: Never Smoker  . Smokeless tobacco: Never Used  Substance and Sexual Activity  . Alcohol use: No    Alcohol/week: 0.0 standard drinks  . Drug use: No  . Sexual activity: Not Currently  Other Topics Concern  . Not on file  Social History Narrative    Patient is a widow. She has 11 children 5 of who are living. She is retired in 1993 from [employer]. She denies tobacco alcohol or drug use.   Social Determinants of Health   Financial Resource Strain:   . Difficulty of Paying Living Expenses:   Food Insecurity:   . Worried About Charity fundraiser in the Last Year:   . Arboriculturist in the Last Year:   Transportation Needs:   . Film/video editor (Medical):   Marland Kitchen Lack of Transportation (Non-Medical):   Physical Activity:   . Days of Exercise per Week:   . Minutes of Exercise per Session:   Stress:   . Feeling of Stress :   Social Connections:   . Frequency of Communication with Friends and Family:   . Frequency of Social Gatherings with Friends and Family:   . Attends Religious Services:   . Active Member of Clubs or Organizations:   . Attends Archivist Meetings:   Marland Kitchen Marital Status:    Family History  Problem Relation Age of Onset  . Heart disease Mother   . Diabetes Mother   . Hyperlipidemia Mother   . Hypertension Mother   . Heart disease Father   . Diabetes Father   . Hyperlipidemia Father   . Hypertension Father     ASSESSMENT Recent Results: The most recent result is correlated with 35 mg per  week: Lab Results  Component Value Date   INR 2.9 11/05/2019   INR 3.5 (A) 10/15/2019   INR 2.2 09/17/2019    Anticoagulation Dosing: Description   Take one (1)  of your '5mg'$  peach-colored warfarin tablet by mouth daily.      INR today: Therapeutic  PLAN Weekly dose was unchanged. Will CONTINUE with '5mg'$  warfarin by mouth each day.   Patient Instructions  Patient instructed to take medications as  defined in the Anti-coagulation Track section of this encounter.  Patient instructed to take today's dose.  Patient instructed to take  one (1)  of your '5mg'$  peach-colored warfarin tablet by mouth daily.  Patient verbalized understanding of these instructions.    Patient advised to contact clinic or seek medical attention if signs/symptoms of bleeding or thromboembolism occur.  Patient verbalized understanding by repeating back information and was advised to contact me if further medication-related questions arise. Patient was also provided an information handout.  Follow-up Return in 4 weeks (on 12/03/2019) for Follow up INR.  Pennie Banter, PharmD, CPP  15 minutes spent face-to-face with the patient during the encounter. 50% of time spent on education, including signs/sx bleeding and clotting, as well as food and drug interactions with warfarin. 50% of time was spent on fingerprick POC INR sample collection,processing, results determination, and documentation in [email or web address]

## 2019-11-05 NOTE — Patient Instructions (Signed)
Patient instructed to take medications as defined in the Anti-coagulation Track section of this encounter.  Patient instructed to take today's dose.  Patient instructed to take  one (1)  of your 5mg  peach-colored warfarin tablet by mouth daily.  Patient verbalized understanding of these instructions.

## 2019-11-14 ENCOUNTER — Ambulatory Visit (HOSPITAL_COMMUNITY)
Admission: RE | Admit: 2019-11-14 | Discharge: 2019-11-14 | Disposition: A | Discharge status: Home / Self Care | Payer: Medicare Other | Source: Ambulatory Visit | Attending: Nephrology | Admitting: Nephrology

## 2019-11-14 VITALS — BP 140/61 | HR 63 | Temp 97.0°F | Resp 20

## 2019-11-14 DIAGNOSIS — N184 Chronic kidney disease, stage 4 (severe): Secondary | ICD-10-CM | POA: Insufficient documentation

## 2019-11-14 LAB — POCT HEMOGLOBIN-HEMACUE: Hemoglobin: 10.3 g/dL — ABNORMAL LOW (ref 12.0–15.0)

## 2019-11-14 MED ORDER — EPOETIN ALFA-EPBX 10000 UNIT/ML IJ SOLN
INTRAMUSCULAR | Status: AC
  Administered 2019-11-14: 10000 [IU]
  Filled 2019-11-14: qty 1

## 2019-11-14 MED ORDER — EPOETIN ALFA-EPBX 10000 UNIT/ML IJ SOLN: 10000.0000 [IU] | INTRAMUSCULAR | Status: DC

## 2019-11-28 ENCOUNTER — Encounter (HOSPITAL_COMMUNITY)
Admission: RE | Admit: 2019-11-28 | Discharge: 2019-11-28 | Disposition: A | Discharge status: Home / Self Care | Payer: Medicare Other | Source: Ambulatory Visit | Attending: Nephrology | Admitting: Nephrology

## 2019-11-28 ENCOUNTER — Other Ambulatory Visit: Payer: Self-pay

## 2019-11-28 VITALS — BP 154/61 | HR 55 | Temp 96.3°F | Resp 20

## 2019-11-28 DIAGNOSIS — N184 Chronic kidney disease, stage 4 (severe): Secondary | ICD-10-CM | POA: Insufficient documentation

## 2019-11-28 LAB — IRON AND TIBC
Iron: 45 ug/dL (ref 28–170)
Saturation Ratios: 29 % (ref 10.4–31.8)
TIBC: 153 ug/dL — ABNORMAL LOW (ref 250–450)
UIBC: 108 ug/dL

## 2019-11-28 LAB — FERRITIN: Ferritin: 555 ng/mL — ABNORMAL HIGH (ref 11–307)

## 2019-11-28 LAB — POCT HEMOGLOBIN-HEMACUE: Hemoglobin: 10.8 g/dL — ABNORMAL LOW (ref 12.0–15.0)

## 2019-11-28 MED ORDER — EPOETIN ALFA-EPBX 10000 UNIT/ML IJ SOLN: 10000.0000 [IU] | INTRAMUSCULAR | Status: DC

## 2019-11-28 MED ORDER — EPOETIN ALFA-EPBX 10000 UNIT/ML IJ SOLN
INTRAMUSCULAR | Status: AC
  Administered 2019-11-28: 10000 [IU] via SUBCUTANEOUS
  Filled 2019-11-28: qty 1

## 2019-11-29 DIAGNOSIS — N184 Chronic kidney disease, stage 4 (severe): Secondary | ICD-10-CM | POA: Diagnosis not present

## 2019-11-29 DIAGNOSIS — D631 Anemia in chronic kidney disease: Secondary | ICD-10-CM | POA: Diagnosis not present

## 2019-11-29 DIAGNOSIS — N2581 Secondary hyperparathyroidism of renal origin: Secondary | ICD-10-CM | POA: Diagnosis not present

## 2019-11-29 DIAGNOSIS — R600 Localized edema: Secondary | ICD-10-CM | POA: Diagnosis not present

## 2019-11-29 DIAGNOSIS — I129 Hypertensive chronic kidney disease with stage 1 through stage 4 chronic kidney disease, or unspecified chronic kidney disease: Secondary | ICD-10-CM | POA: Diagnosis not present

## 2019-12-02 DIAGNOSIS — R296 Repeated falls: Secondary | ICD-10-CM | POA: Diagnosis not present

## 2019-12-02 DIAGNOSIS — N183 Chronic kidney disease, stage 3 unspecified: Secondary | ICD-10-CM | POA: Diagnosis not present

## 2019-12-03 ENCOUNTER — Ambulatory Visit (INDEPENDENT_AMBULATORY_CARE_PROVIDER_SITE_OTHER): Payer: Medicare Other | Admitting: Pharmacist

## 2019-12-03 ENCOUNTER — Other Ambulatory Visit: Payer: Self-pay | Admitting: Pharmacist

## 2019-12-03 DIAGNOSIS — Z86718 Personal history of other venous thrombosis and embolism: Secondary | ICD-10-CM

## 2019-12-03 DIAGNOSIS — Z7901 Long term (current) use of anticoagulants: Secondary | ICD-10-CM

## 2019-12-03 LAB — POCT INR: INR: 2.2 (ref 2.0–3.0)

## 2019-12-03 NOTE — Patient Instructions (Signed)
Patient instructed to take medications as defined in the Anti-coagulation Track section of this encounter.  Patient instructed to take today's dose.  Patient instructed to take  one (1)  of your 5mg  peach-colored warfarin tablets by mouth daily. Patient verbalized understanding of these instructions.

## 2019-12-03 NOTE — Progress Notes (Signed)
Anticoagulation Management Chelsea Mcdaniel is a 86 y.o. female who reports to the clinic for monitoring of warfarin treatment.    Indication: DVT, History of (resolved); Long term current use of anticoagulant warfarin.  Duration: indefinite Supervising physician: Aldine Contes  Anticoagulation Clinic Visit History: Patient does not report signs/symptoms of bleeding or thromboembolism  Other recent changes: No diet, medications, lifestyle changes.  Anticoagulation Episode Summary    Current INR goal:  2.0-3.0  TTR:  65.7 % (5.8 y)  Next INR check:  12/31/2019  INR from last check:  2.2 (12/03/2019)  Weekly max warfarin dose:    Target end date:    INR check location:    Preferred lab:    Send INR reminders to:     Indications   EMBOLISM/THROMBOSIS DEEP VSL LWR EXTRM NOS (Resolved) [I82.409] Long-term (current) use of anticoagulants [Z79.01]       Comments:          Allergies  Allergen Reactions  . Diltiazem Other (See Comments)    Symptomatic bradaycardia  . Metoprolol Other (See Comments)    Symptomatic bradycardia    Current Outpatient Medications:  .  Accu-Chek Softclix Lancets lancets, Use to check blood sugar 3 times daily. DIAG CODE E11.9, Disp: 300 each, Rfl: 1 .  allopurinol (ZYLOPRIM) 100 MG tablet, TAKE 1/2 TABLET BY MOUTH EVERY OTHER DAY, Disp: 45 tablet, Rfl: 1 .  amLODipine (NORVASC) 10 MG tablet, TAKE 1 TABLET BY MOUTH EVERY DAY, Disp: 90 tablet, Rfl: 1 .  Blood Glucose Monitoring Suppl (ACCU-CHEK AVIVA PLUS) w/Device KIT, Check blood sugar 1 time a day, Disp: 1 kit, Rfl: 0 .  cloNIDine (CATAPRES - DOSED IN MG/24 HR) 0.3 mg/24hr patch, APPLY 1 PATCH TO SKIN EVERY 7 DAYS, Disp: 12 patch, Rfl: 1 .  dorzolamide-timolol (COSOPT) 22.3-6.8 MG/ML ophthalmic solution, Place 1 drop into both eyes 2 (two) times daily., Disp: , Rfl: 3 .  furosemide (LASIX) 10 MG/ML solution, Take 24m qam and 487mqpm, Disp: 120 mL, Rfl: 1 .  GAVILAX 17 GM/SCOOP powder, Take 17 grams  by mouth 2 times daily, Disp: 850 g, Rfl: 2 .  glucose blood (ACCU-CHEK AVIVA PLUS) test strip, USE 1 TIME DAILY TO CHECK BLOOD SUGAR, Disp: 100 each, Rfl: 3 .  JANUVIA 25 MG tablet, TAKE 1 TABLET BY MOUTH EVERY DAY, Disp: 90 tablet, Rfl: 1 .  Lancets Misc. (ACCU-CHEK SOFTCLIX LANCET DEV) KIT, Use to check blood sugar one time a day, Disp: 1 kit, Rfl: 2 .  lidocaine (LIDODERM) 5 %, Place 1 patch onto the skin daily. Remove & Discard patch within 12 hours or as directed by MD, Disp: 30 patch, Rfl: 0 .  LUMIGAN 0.01 % SOLN, Place 1 drop into both eyes at bedtime. , Disp: , Rfl:  .  Melatonin 1 MG/ML LIQD, TAKE 3.5 mls BY MOUTH AT BEDTIME AS NEEDED, Disp: 58 mL, Rfl: 1 .  polyethylene glycol (MIRALAX) packet, Take 17 g by mouth 2 (two) times daily., Disp: 180 packet, Rfl: 1 .  glucose blood (ACCU-CHEK AVIVA) test strip, Use as instructed, Disp: 100 each, Rfl: 12 .  warfarin (COUMADIN) 5 MG tablet, Take 1 tablet (5 mg total) by mouth daily at 4 PM., Disp: 31 tablet, Rfl: 2 Past Medical History:  Diagnosis Date  . Anemia     normocytic anemia with baseline hemoglobin 10-11  . Blindness of left eye     likely related to stroke, left cataract removed from that eye with complications  .  Calculus gallbladder and bile duct with cholecystitis with obstruction 01/2015  . Chronic kidney disease 2006    left renal artery stenosis with probable hemodynamic significance, kidneys are normal in morphology without focal lesions or hydronephrosis this is based but cannot A. of the abdomen with and without contrast done on the 31st 2006  . CKD (chronic kidney disease), stage III   . CVA (cerebral vascular accident) ([place]) 1990's  . CVA (cerebrovascular accident) [facility])  October 2007    CT of the head atrophy with multiple remote insults noted but no definite acute findings  . Glaucoma   . Gout   . Hyperlipidemia   . Hypertension   . Personal history of DVT (deep vein thrombosis) 08/11/2010   BLE  . Type II  diabetes mellitus ([place])    Social History   Socioeconomic History  . Marital status: Widowed    Spouse name: Not on file  . Number of children: Not on file  . Years of education: Not on file  . Highest education level: Not on file  Occupational History  . Not on file  Tobacco Use  . Smoking status: Never Smoker  . Smokeless tobacco: Never Used  Substance and Sexual Activity  . Alcohol use: No    Alcohol/week: 0.0 standard drinks  . Drug use: No  . Sexual activity: Not Currently  Other Topics Concern  . Not on file  Social History Narrative    Patient is a widow. She has 11 children 5 of who are living. She is retired in 1993 from [employer]. She denies tobacco alcohol or drug use.   Social Determinants of Health   Financial Resource Strain:   . Difficulty of Paying Living Expenses:   Food Insecurity:   . Worried About Charity fundraiser in the Last Year:   . Arboriculturist in the Last Year:   Transportation Needs:   . Film/video editor (Medical):   Marland Kitchen Lack of Transportation (Non-Medical):   Physical Activity:   . Days of Exercise per Week:   . Minutes of Exercise per Session:   Stress:   . Feeling of Stress :   Social Connections:   . Frequency of Communication with Friends and Family:   . Frequency of Social Gatherings with Friends and Family:   . Attends Religious Services:   . Active Member of Clubs or Organizations:   . Attends Archivist Meetings:   Marland Kitchen Marital Status:    Family History  Problem Relation Age of Onset  . Heart disease Mother   . Diabetes Mother   . Hyperlipidemia Mother   . Hypertension Mother   . Heart disease Father   . Diabetes Father   . Hyperlipidemia Father   . Hypertension Father     ASSESSMENT Recent Results: The most recent result is correlated with 35 mg per week: Lab Results  Component Value Date   INR 2.2 12/03/2019   INR 2.9 11/05/2019   INR 3.5 (A) 10/15/2019    Anticoagulation  Dosing: Description   Take one (1)  of your 28m peach-colored warfarin tablets by mouth daily.      INR today: Therapeutic  PLAN Weekly dose was unchanged.   Patient Instructions  Patient instructed to take medications as defined in the Anti-coagulation Track section of this encounter.  Patient instructed to take today's dose.  Patient instructed to take  one (1)  of your 569mpeach-colored warfarin tablets by mouth daily. Patient verbalized  understanding of these instructions.    Patient advised to contact clinic or seek medical attention if signs/symptoms of bleeding or thromboembolism occur.  Patient verbalized understanding by repeating back information and was advised to contact me if further medication-related questions arise. Patient was also provided an information handout.  Follow-up Return in 4 weeks (on 12/31/2019) for Follow up INR.  Pennie Banter, PharmD, CPP  15 minutes spent face-to-face with the patient during the encounter. 50% of time spent on education, including signs/sx bleeding and clotting, as well as food and drug interactions with warfarin. 50% of time was spent on fingerprick POC INR sample collection,processing, results determination, and documentation in [email or web address]/.

## 2019-12-04 NOTE — Progress Notes (Signed)
INTERNAL MEDICINE TEACHING ATTENDING ADDENDUM - Aldine Contes M.D  Duration- indefinite, Indication- recurrent CVA, DVT, INR- therapeutic. Agree with pharmacy recommendations as outlined in their note.

## 2019-12-12 ENCOUNTER — Ambulatory Visit (HOSPITAL_COMMUNITY)
Admission: RE | Admit: 2019-12-12 | Discharge: 2019-12-12 | Disposition: A | Discharge status: Home / Self Care | Payer: Medicare Other | Source: Ambulatory Visit | Attending: Nephrology | Admitting: Nephrology

## 2019-12-12 ENCOUNTER — Other Ambulatory Visit: Payer: Self-pay

## 2019-12-12 VITALS — BP 175/83 | HR 64 | Temp 96.4°F | Resp 20

## 2019-12-12 DIAGNOSIS — N184 Chronic kidney disease, stage 4 (severe): Secondary | ICD-10-CM | POA: Diagnosis not present

## 2019-12-12 LAB — POCT HEMOGLOBIN-HEMACUE: Hemoglobin: 12.1 g/dL (ref 12.0–15.0)

## 2019-12-12 MED ORDER — EPOETIN ALFA-EPBX 10000 UNIT/ML IJ SOLN
INTRAMUSCULAR | Status: AC
  Filled 2019-12-12: qty 1

## 2019-12-12 MED ORDER — EPOETIN ALFA-EPBX 10000 UNIT/ML IJ SOLN: 10000.0000 [IU] | INTRAMUSCULAR | Status: DC

## 2019-12-19 DIAGNOSIS — H2511 Age-related nuclear cataract, right eye: Secondary | ICD-10-CM | POA: Diagnosis not present

## 2019-12-19 DIAGNOSIS — H338 Other retinal detachments: Secondary | ICD-10-CM | POA: Diagnosis not present

## 2019-12-19 DIAGNOSIS — H2702 Aphakia, left eye: Secondary | ICD-10-CM | POA: Diagnosis not present

## 2019-12-19 DIAGNOSIS — H401113 Primary open-angle glaucoma, right eye, severe stage: Secondary | ICD-10-CM | POA: Diagnosis not present

## 2019-12-25 ENCOUNTER — Other Ambulatory Visit: Payer: Self-pay

## 2019-12-25 ENCOUNTER — Ambulatory Visit (INDEPENDENT_AMBULATORY_CARE_PROVIDER_SITE_OTHER): Payer: Medicare Other | Admitting: Internal Medicine

## 2019-12-25 ENCOUNTER — Encounter: Payer: Self-pay | Admitting: Internal Medicine

## 2019-12-25 ENCOUNTER — Other Ambulatory Visit: Payer: Self-pay | Admitting: Internal Medicine

## 2019-12-25 VITALS — BP 149/54 | HR 62 | Temp 98.3°F | Wt 127.4 lb

## 2019-12-25 DIAGNOSIS — E1159 Type 2 diabetes mellitus with other circulatory complications: Secondary | ICD-10-CM

## 2019-12-25 DIAGNOSIS — N184 Chronic kidney disease, stage 4 (severe): Secondary | ICD-10-CM

## 2019-12-25 DIAGNOSIS — M25472 Effusion, left ankle: Secondary | ICD-10-CM

## 2019-12-25 DIAGNOSIS — I129 Hypertensive chronic kidney disease with stage 1 through stage 4 chronic kidney disease, or unspecified chronic kidney disease: Secondary | ICD-10-CM

## 2019-12-25 DIAGNOSIS — E1122 Type 2 diabetes mellitus with diabetic chronic kidney disease: Secondary | ICD-10-CM | POA: Diagnosis not present

## 2019-12-25 DIAGNOSIS — M25471 Effusion, right ankle: Secondary | ICD-10-CM | POA: Diagnosis not present

## 2019-12-25 DIAGNOSIS — I152 Hypertension secondary to endocrine disorders: Secondary | ICD-10-CM

## 2019-12-25 LAB — GLUCOSE, CAPILLARY: Glucose-Capillary: 165 mg/dL — ABNORMAL HIGH (ref 70–99)

## 2019-12-25 LAB — POCT GLYCOSYLATED HEMOGLOBIN (HGB A1C): Hemoglobin A1C: 6 % — AB (ref 4.0–5.6)

## 2019-12-25 MED ORDER — FUROSEMIDE 10 MG/ML PO SOLN: ORAL | 1 refills | Status: DC

## 2019-12-25 MED ORDER — AMLODIPINE BESYLATE 5 MG PO TABS: 5.0000 mg | ORAL_TABLET | Freq: Every day | ORAL | 0 refills | Status: DC

## 2019-12-25 MED ORDER — HYDRALAZINE HCL 25 MG PO TABS: 75.0000 mg | ORAL_TABLET | Freq: Three times a day (TID) | ORAL | Status: AC

## 2019-12-25 NOTE — Progress Notes (Signed)
   CC: Lower extremity edema  HPI:  [name] with diabetes mellitus type 2, hypertension, ckd 4, hld who presents for follow up of diabetes. Please see problem based charting for evaluation, assessment, and plan.  Past Medical History:  Diagnosis Date  . Anemia     normocytic anemia with baseline hemoglobin 10-11  . Blindness of left eye     likely related to stroke, left cataract removed from that eye with complications  . Calculus gallbladder and bile duct with cholecystitis with obstruction 01/2015  . Chronic kidney disease 2006    left renal artery stenosis with probable hemodynamic significance, kidneys are normal in morphology without focal lesions or hydronephrosis this is based but cannot A. of the abdomen with and without contrast done on the 31st 2006  . CKD (chronic kidney disease), stage III   . CVA (cerebral vascular accident) ([place]) 1990's  . CVA (cerebrovascular accident) [facility])  October 2007    CT of the head atrophy with multiple remote insults noted but no definite acute findings  . Glaucoma   . Gout   . Hyperlipidemia   . Hypertension   . Personal history of DVT (deep vein thrombosis) 08/11/2010   BLE  . Type II diabetes mellitus (HCC)    Review of Systems:   Denies dyspnea, chest pain, abdominal pain, difficulty urinating  Physical Exam:  Vitals:   12/25/19 1323  BP: (!) 149/54  Pulse: 62  Temp: 98.3 F (36.8 C)  TempSrc: Oral  SpO2: 100%   Physical Exam  Constitutional: She appears well-developed and well-nourished. No distress.  Appears somnolent on exam  Cardiovascular: Normal rate, regular rhythm and normal heart sounds.  Respiratory: Effort normal and breath sounds normal. No respiratory distress. She has no wheezes.  GI: Soft. Bowel sounds are normal. She exhibits no distension. There is no abdominal tenderness.  Musculoskeletal:        General: Edema (2+ in right lower extremity and 1+ in left lower extremity) present.    Skin: She is not diaphoretic.  Psychiatric: She has a normal mood and affect. Her behavior is normal. Judgment and thought content normal.   Assessment & Plan:   See Encounters Tab for problem based charting.  Patient discussed with Dr. Heber Britt

## 2019-12-25 NOTE — Patient Instructions (Signed)
It was a pleasure to see you today Ms. Mcmeans. Please make the following changes:  -please decrease amlodipine to 5mg  daily  -please increase lasix to 80mg  twice daily  -follow up in 2 weeks  If you have any questions or concerns, please call our clinic at [phone] between 9am-5pm and after hours call [phone] and ask for the internal medicine resident on call. If you feel you are having a medical emergency please call 911.   Thank you, we look forward to help you remain healthy!  [name], MD Internal Medicine PGY3   To schedule an appointment for a COVID vaccine or be added to the vaccine wait list: Go to [email or web address]   OR Go to [email or web address]                  OR Call [phone]                                     OR Call [phone] and select Option 2  [place] ([place]) [place] [place], [place] [address] Hours: Mon,Thu 8-5, Sat 8-12 Type: [place] (12+)  [place]  ([place]) [place] [employer] [place], [place] [address] Hours: Thu: 1-5 Type: Pfizer (12+)

## 2019-12-26 ENCOUNTER — Encounter (HOSPITAL_COMMUNITY)
Admission: RE | Admit: 2019-12-26 | Discharge: 2019-12-26 | Disposition: A | Discharge status: Home / Self Care | Payer: Medicare Other | Source: Ambulatory Visit | Attending: Nephrology | Admitting: Nephrology

## 2019-12-26 VITALS — BP 154/57 | HR 63 | Temp 98.4°F | Resp 18

## 2019-12-26 DIAGNOSIS — N184 Chronic kidney disease, stage 4 (severe): Secondary | ICD-10-CM | POA: Diagnosis not present

## 2019-12-26 LAB — IRON AND TIBC
Iron: 51 ug/dL (ref 28–170)
Saturation Ratios: 31 % (ref 10.4–31.8)
TIBC: 165 ug/dL — ABNORMAL LOW (ref 250–450)
UIBC: 114 ug/dL

## 2019-12-26 LAB — FERRITIN: Ferritin: 554 ng/mL — ABNORMAL HIGH (ref 11–307)

## 2019-12-26 LAB — POCT HEMOGLOBIN-HEMACUE: Hemoglobin: 10.8 g/dL — ABNORMAL LOW (ref 12.0–15.0)

## 2019-12-26 MED ORDER — EPOETIN ALFA-EPBX 10000 UNIT/ML IJ SOLN
INTRAMUSCULAR | Status: AC
  Administered 2019-12-26: 10000 [IU] via SUBCUTANEOUS
  Filled 2019-12-26: qty 1

## 2019-12-26 MED ORDER — EPOETIN ALFA-EPBX 10000 UNIT/ML IJ SOLN: 10000.0000 [IU] | INTRAMUSCULAR | Status: DC

## 2019-12-26 NOTE — Assessment & Plan Note (Signed)
The patient's a1c during this visit is 6.0. She is currently taking [place] 25mg  qd. The patient is compliant with medication. Patient's weight has been stable.   Assessment and plan  Good glycemic control. Continue current medication regimen.

## 2019-12-26 NOTE — Assessment & Plan Note (Signed)
Patient has been having bilateral lower extremity swelling for approximately one month. She denies any dyspnea, orthopnea. Patient had been taking lasix 80mg  qam and 40mg  qpm. She was evaluated by nephrology few weeks ago and at that visit was told to continue current diuretic regimen and take hydralazine 75mg  tid.   Patient's daughter in law states that she has noticed that she has had decreased amount of urination.   During today's visit the patient has bilateral lower extremity edema throughout both legs and ankles. Right lower extremity 2+ and left lower extremity 1+.  Assessment and plan  The patient's lower extremity edema is likely secondary to progression of kidney disease. Patient and family have opted against pursuing hd. Will increase diuretic to lasix 71ml bid. Recommend the patient follow up with nephrology.   Will also decrease amlodipine from 10mg  qd to 5mg  qd as increased amlodipine doses can cause lower extremity edema. Less likely that the patient has a dvt at this time as she has been adherent to warfarin and inr has been therapeutic.  Follow up in 2 weeks.

## 2019-12-26 NOTE — Assessment & Plan Note (Signed)
The patient's bp during this visit is 149/54. She is currently taking clonidine 0.3mg  once per week, amlodipine 10mg  qd, lasix 80mg  qam and 40mg  qpm, and hydralazine 75mg  tid.   Assessment and plan  Patient's blood pressure is relatively well controlled given age and severe renal impairment. However, will increase lasix to 80mg  bid and decrease amlodipine to 5mg  qd.

## 2019-12-31 ENCOUNTER — Encounter (HOSPITAL_COMMUNITY): Payer: Self-pay

## 2019-12-31 ENCOUNTER — Ambulatory Visit (INDEPENDENT_AMBULATORY_CARE_PROVIDER_SITE_OTHER): Payer: Medicare Other | Admitting: Pharmacist

## 2019-12-31 ENCOUNTER — Emergency Department (HOSPITAL_COMMUNITY): Payer: Medicare Other

## 2019-12-31 ENCOUNTER — Emergency Department (HOSPITAL_COMMUNITY)
Admission: EM | Admit: 2019-12-31 | Discharge: 2019-12-31 | Disposition: A | Discharge status: Home / Self Care | Payer: Medicare Other | Source: Home / Self Care | Attending: Emergency Medicine | Admitting: Emergency Medicine

## 2019-12-31 ENCOUNTER — Other Ambulatory Visit: Payer: Self-pay

## 2019-12-31 ENCOUNTER — Other Ambulatory Visit: Payer: Self-pay | Admitting: Internal Medicine

## 2019-12-31 ENCOUNTER — Telehealth: Payer: Self-pay | Admitting: *Deleted

## 2019-12-31 DIAGNOSIS — R2689 Other abnormalities of gait and mobility: Secondary | ICD-10-CM | POA: Diagnosis not present

## 2019-12-31 DIAGNOSIS — Z7901 Long term (current) use of anticoagulants: Secondary | ICD-10-CM

## 2019-12-31 DIAGNOSIS — I1310 Hypertensive heart and chronic kidney disease without heart failure, with stage 1 through stage 4 chronic kidney disease, or unspecified chronic kidney disease: Secondary | ICD-10-CM | POA: Diagnosis not present

## 2019-12-31 DIAGNOSIS — R519 Headache, unspecified: Secondary | ICD-10-CM | POA: Diagnosis not present

## 2019-12-31 DIAGNOSIS — S0083XA Contusion of other part of head, initial encounter: Secondary | ICD-10-CM | POA: Insufficient documentation

## 2019-12-31 DIAGNOSIS — Y92129 Unspecified place in nursing home as the place of occurrence of the external cause: Secondary | ICD-10-CM | POA: Insufficient documentation

## 2019-12-31 DIAGNOSIS — Z888 Allergy status to other drugs, medicaments and biological substances status: Secondary | ICD-10-CM | POA: Diagnosis not present

## 2019-12-31 DIAGNOSIS — S0990XA Unspecified injury of head, initial encounter: Secondary | ICD-10-CM | POA: Diagnosis not present

## 2019-12-31 DIAGNOSIS — E1151 Type 2 diabetes mellitus with diabetic peripheral angiopathy without gangrene: Secondary | ICD-10-CM | POA: Diagnosis not present

## 2019-12-31 DIAGNOSIS — W1830XA Fall on same level, unspecified, initial encounter: Secondary | ICD-10-CM | POA: Insufficient documentation

## 2019-12-31 DIAGNOSIS — E1159 Type 2 diabetes mellitus with other circulatory complications: Secondary | ICD-10-CM

## 2019-12-31 DIAGNOSIS — Z20822 Contact with and (suspected) exposure to covid-19: Secondary | ICD-10-CM | POA: Diagnosis not present

## 2019-12-31 DIAGNOSIS — M109 Gout, unspecified: Secondary | ICD-10-CM | POA: Diagnosis not present

## 2019-12-31 DIAGNOSIS — F0390 Unspecified dementia without behavioral disturbance: Secondary | ICD-10-CM | POA: Insufficient documentation

## 2019-12-31 DIAGNOSIS — E785 Hyperlipidemia, unspecified: Secondary | ICD-10-CM | POA: Diagnosis not present

## 2019-12-31 DIAGNOSIS — G9389 Other specified disorders of brain: Secondary | ICD-10-CM | POA: Diagnosis not present

## 2019-12-31 DIAGNOSIS — E860 Dehydration: Secondary | ICD-10-CM | POA: Insufficient documentation

## 2019-12-31 DIAGNOSIS — I129 Hypertensive chronic kidney disease with stage 1 through stage 4 chronic kidney disease, or unspecified chronic kidney disease: Secondary | ICD-10-CM | POA: Insufficient documentation

## 2019-12-31 DIAGNOSIS — I639 Cerebral infarction, unspecified: Secondary | ICD-10-CM | POA: Diagnosis not present

## 2019-12-31 DIAGNOSIS — S299XXA Unspecified injury of thorax, initial encounter: Secondary | ICD-10-CM | POA: Diagnosis not present

## 2019-12-31 DIAGNOSIS — I6381 Other cerebral infarction due to occlusion or stenosis of small artery: Secondary | ICD-10-CM | POA: Diagnosis not present

## 2019-12-31 DIAGNOSIS — S0993XA Unspecified injury of face, initial encounter: Secondary | ICD-10-CM | POA: Diagnosis not present

## 2019-12-31 DIAGNOSIS — H409 Unspecified glaucoma: Secondary | ICD-10-CM | POA: Diagnosis not present

## 2019-12-31 DIAGNOSIS — I10 Essential (primary) hypertension: Secondary | ICD-10-CM | POA: Diagnosis not present

## 2019-12-31 DIAGNOSIS — Y9301 Activity, walking, marching and hiking: Secondary | ICD-10-CM | POA: Insufficient documentation

## 2019-12-31 DIAGNOSIS — Z86718 Personal history of other venous thrombosis and embolism: Secondary | ICD-10-CM

## 2019-12-31 DIAGNOSIS — R531 Weakness: Secondary | ICD-10-CM | POA: Diagnosis not present

## 2019-12-31 DIAGNOSIS — I63531 Cerebral infarction due to unspecified occlusion or stenosis of right posterior cerebral artery: Secondary | ICD-10-CM | POA: Diagnosis not present

## 2019-12-31 DIAGNOSIS — R404 Transient alteration of awareness: Secondary | ICD-10-CM | POA: Diagnosis not present

## 2019-12-31 DIAGNOSIS — G9341 Metabolic encephalopathy: Secondary | ICD-10-CM | POA: Diagnosis not present

## 2019-12-31 DIAGNOSIS — Z7984 Long term (current) use of oral hypoglycemic drugs: Secondary | ICD-10-CM | POA: Diagnosis not present

## 2019-12-31 DIAGNOSIS — S3993XA Unspecified injury of pelvis, initial encounter: Secondary | ICD-10-CM | POA: Diagnosis not present

## 2019-12-31 DIAGNOSIS — E1122 Type 2 diabetes mellitus with diabetic chronic kidney disease: Secondary | ICD-10-CM | POA: Insufficient documentation

## 2019-12-31 DIAGNOSIS — I69354 Hemiplegia and hemiparesis following cerebral infarction affecting left non-dominant side: Secondary | ICD-10-CM | POA: Diagnosis not present

## 2019-12-31 DIAGNOSIS — N183 Chronic kidney disease, stage 3 unspecified: Secondary | ICD-10-CM | POA: Insufficient documentation

## 2019-12-31 DIAGNOSIS — Z743 Need for continuous supervision: Secondary | ICD-10-CM | POA: Diagnosis not present

## 2019-12-31 DIAGNOSIS — S199XXA Unspecified injury of neck, initial encounter: Secondary | ICD-10-CM | POA: Diagnosis not present

## 2019-12-31 DIAGNOSIS — H5462 Unqualified visual loss, left eye, normal vision right eye: Secondary | ICD-10-CM | POA: Diagnosis not present

## 2019-12-31 DIAGNOSIS — N184 Chronic kidney disease, stage 4 (severe): Secondary | ICD-10-CM | POA: Diagnosis not present

## 2019-12-31 DIAGNOSIS — Z79899 Other long term (current) drug therapy: Secondary | ICD-10-CM | POA: Insufficient documentation

## 2019-12-31 DIAGNOSIS — W19XXXA Unspecified fall, initial encounter: Secondary | ICD-10-CM | POA: Diagnosis not present

## 2019-12-31 DIAGNOSIS — Z7982 Long term (current) use of aspirin: Secondary | ICD-10-CM | POA: Diagnosis not present

## 2019-12-31 DIAGNOSIS — Y999 Unspecified external cause status: Secondary | ICD-10-CM | POA: Insufficient documentation

## 2019-12-31 DIAGNOSIS — S0081XA Abrasion of other part of head, initial encounter: Secondary | ICD-10-CM | POA: Insufficient documentation

## 2019-12-31 LAB — COMPREHENSIVE METABOLIC PANEL
ALT: 11 U/L (ref 0–44)
AST: 20 U/L (ref 15–41)
Albumin: 3.9 g/dL (ref 3.5–5.0)
Alkaline Phosphatase: 79 U/L (ref 38–126)
Anion gap: 18 — ABNORMAL HIGH (ref 5–15)
BUN: 40 mg/dL — ABNORMAL HIGH (ref 8–23)
CO2: 24 mmol/L (ref 22–32)
Calcium: 9.5 mg/dL (ref 8.9–10.3)
Chloride: 96 mmol/L — ABNORMAL LOW (ref 98–111)
Creatinine, Ser: 3.35 mg/dL — ABNORMAL HIGH (ref 0.44–1.00)
GFR calc Af Amer: 13 mL/min — ABNORMAL LOW (ref >60)
GFR calc non Af Amer: 11 mL/min — ABNORMAL LOW (ref >60)
Glucose, Bld: 165 mg/dL — ABNORMAL HIGH (ref 70–99)
Potassium: 3.3 mmol/L — ABNORMAL LOW (ref 3.5–5.1)
Sodium: 138 mmol/L (ref 135–145)
Total Bilirubin: 0.8 mg/dL (ref 0.3–1.2)
Total Protein: 7.4 g/dL (ref 6.5–8.1)

## 2019-12-31 LAB — I-STAT CHEM 8, ED
BUN: 39 mg/dL — ABNORMAL HIGH (ref 8–23)
Calcium, Ion: 1.11 mmol/L — ABNORMAL LOW (ref 1.15–1.40)
Chloride: 99 mmol/L (ref 98–111)
Creatinine, Ser: 3.4 mg/dL — ABNORMAL HIGH (ref 0.44–1.00)
Glucose, Bld: 160 mg/dL — ABNORMAL HIGH (ref 70–99)
HCT: 35 % — ABNORMAL LOW (ref 36.0–46.0)
Hemoglobin: 11.9 g/dL — ABNORMAL LOW (ref 12.0–15.0)
Potassium: 3.3 mmol/L — ABNORMAL LOW (ref 3.5–5.1)
Sodium: 140 mmol/L (ref 135–145)
TCO2: 25 mmol/L (ref 22–32)

## 2019-12-31 LAB — POCT INR: INR: 1.3 — AB (ref 2.0–3.0)

## 2019-12-31 LAB — CBC
HCT: 38.2 % (ref 36.0–46.0)
Hemoglobin: 12.2 g/dL (ref 12.0–15.0)
MCH: 30.7 pg (ref 26.0–34.0)
MCHC: 31.9 g/dL (ref 30.0–36.0)
MCV: 96 fL (ref 80.0–100.0)
Platelets: 325 10*3/uL (ref 150–400)
RBC: 3.98 MIL/uL (ref 3.87–5.11)
RDW: 14 % (ref 11.5–15.5)
WBC: 6.3 10*3/uL (ref 4.0–10.5)
nRBC: 0 % (ref 0.0–0.2)

## 2019-12-31 LAB — LACTIC ACID, PLASMA: Lactic Acid, Venous: 1.2 mmol/L (ref 0.5–1.9)

## 2019-12-31 LAB — PROTIME-INR
INR: 1.3 — ABNORMAL HIGH (ref 0.8–1.2)
Prothrombin Time: 15.2 seconds (ref 11.4–15.2)

## 2019-12-31 LAB — SAMPLE TO BLOOD BANK

## 2019-12-31 LAB — ETHANOL: Alcohol, Ethyl (B): <10 mg/dL (ref <10)

## 2019-12-31 NOTE — Progress Notes (Signed)
Internal Medicine Clinic Attending  Case discussed with Dr. Chundi at the time of the visit.  We reviewed the resident's history and exam and pertinent patient test results.  I agree with the assessment, diagnosis, and plan of care documented in the resident's note. 

## 2019-12-31 NOTE — ED Notes (Signed)
Pt discharge instructions and follow-up instructions reviewed with the patient and family. Verbalized understanding of instructions. Pt discharged.

## 2019-12-31 NOTE — Telephone Encounter (Signed)
Presents for coumadin clinic w/ dr Elie Confer. Reports at front desk pt had a fall today leaving her house and has some scratches and scrapes but daughter is concerned. She is in w/ dr groce at this time.  Checked w/ dr Elie Confer, she is being taken by [facility]. To ED via wh/ch after dr Evette Doffing did a short assessment.

## 2019-12-31 NOTE — Patient Instructions (Signed)
Patient instructed to take medications as defined in the Anti-coagulation Track section of this encounter.  Patient instructed to take today's dose. (Had ALREADY TAKEN BEFORE ARRIVAL TO CLINIC) Patient instructed to take one (1)  of your 5mg  peach-colored warfarin tablets by mouth daily. ASK THE EMERGENCY ROOM PHYSICIAN WHEN YOU MAY RESUME TAKING WARFARIN, RELATIVE TO HIS/HER FINDINGS TODAY! Patient verbalized understanding of these instructions.

## 2019-12-31 NOTE — ED Provider Notes (Signed)
[facility] EMERGENCY DEPARTMENT Provider Note   CSN: [id number] Arrival date & time: 12/31/19  1524     History Chief Complaint  Patient presents with  . Fall    Shye Doty Suchan is a 86 y.o. female.  HPI   Patient presents with her daughter-in-law after a fall. Patient has dementia, level 5 caveat. Daughter-in-law provides much of the history, though the patient does answer some questions briefly, seemingly appropriately about herself, and how she is doing currently, but cannot provide any details of earlier events substantially. Patient was in her usual state of health, when while in route from her facility she stumbled.  She did this while using her walker.  She sustained head injury, but no clear loss of consciousness. No change in behavior afterwards, and the patient has been able to bear weight again since the fall. Reportedly the patient has received today's Coumadin dose, had INR check earlier in the day. Seemingly the patient has been doing generally well until today's fall. Currently the patient denies any pain, weakness, complaints, but again has dementia, level 5 caveat.   Past Medical History:  Diagnosis Date  . Anemia     normocytic anemia with baseline hemoglobin 10-11  . Blindness of left eye     likely related to stroke, left cataract removed from that eye with complications  . Calculus gallbladder and bile duct with cholecystitis with obstruction 01/2015  . Chronic kidney disease 2006    left renal artery stenosis with probable hemodynamic significance, kidneys are normal in morphology without focal lesions or hydronephrosis this is based but cannot A. of the abdomen with and without contrast done on the 31st 2006  . CKD (chronic kidney disease), stage III   . CVA (cerebral vascular accident) ([place]) 1990's  . CVA (cerebrovascular accident) [facility])  October 2007    CT of the head atrophy with multiple remote insults noted but no definite acute  findings  . Glaucoma   . Gout   . Hyperlipidemia   . Hypertension   . Personal history of DVT (deep vein thrombosis) 08/11/2010   BLE  . Type II diabetes mellitus [facility])     Patient Active Problem List   Diagnosis Date Noted  . Epidermoid cyst of skin of chest 09/17/2019  . Insomnia 07/10/2019  . Protein-calorie malnutrition, severe 07/14/2018  . Dementia ([place]) 07/12/2018  . Fall at home 07/11/2018  . Malaise and fatigue 05/02/2018  . Weight loss 02/28/2018  . bilateral lower extremity edema  04/18/2017  . Senile purpura ([place]) 12/18/2016  . Immunization not carried out because of parent refusal 12/18/2016  . Advance care planning 02/18/2016  . Fatigue 10/20/2015  . Unequal blood pressure in upper extremities 10/20/2015  . Choledocholithiasis 02/11/2015  . Constipation 01/01/2015  . Cervical radiculopathy 12/10/2014  . Left knee pain 08/24/2014  . Vitamin D insufficiency 08/24/2014  . Carpal tunnel syndrome 01/07/2014  . 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 Healthcare maintenance 08/15/2013  . CKD (chronic kidney disease) stage 4, GFR 15-29 ml/min (HCC) 02/16/2012  . Osteoarthritis of hand 02/16/2012  . Long-term (current) use of anticoagulants 08/11/2010  . History of DVT (deep vein thrombosis) 08/11/2010  . Type 2 diabetes mellitus ([place]) 12/12/2006  . Hyperlipidemia 12/12/2006  . Anemia of chronic disease 12/12/2006  . Hypertension associated with diabetes ([place]) 12/12/2006  . Late effect of cerebrovascular accident (CVA) 09/19/2006    Past Surgical History:  Procedure Laterality Date  .  ABDOMINAL HYSTERECTOMY    . CATARACT EXTRACTION Left   . TRANSTHORACIC ECHOCARDIOGRAM   May 2008    left ventricular systolic function normal EF estimated range of 55-60%, no definite diagnostic evidence of left ventricular regional wall motion abnormalities, Doppler parameters consistent with abnormal left ventricular relaxation, findings suggestive of  possible bicuspid aortic valve and aortic valve thickness mildly increase     OB History   No obstetric history on file.     Family History  Problem Relation Age of Onset  . Heart disease Mother   . Diabetes Mother   . Hyperlipidemia Mother   . Hypertension Mother   . Heart disease Father   . Diabetes Father   . Hyperlipidemia Father   . Hypertension Father     Social History   Tobacco Use  . Smoking status: Never Smoker  . Smokeless tobacco: Never Used  Substance Use Topics  . Alcohol use: No    Alcohol/week: 0.0 standard drinks  . Drug use: No    Home Medications Prior to Admission medications   Medication Sig Start Date End Date Taking? Authorizing Provider  Accu-Chek Softclix Lancets lancets Use to check blood sugar 3 times daily. DIAG CODE E11.9 02/28/19   [name], MD  allopurinol (ZYLOPRIM) 100 MG tablet TAKE 1/2 TABLET BY MOUTH EVERY OTHER DAY 05/22/19   Bartholomew Crews, MD  amLODipine (NORVASC) 5 MG tablet Take 1 tablet (5 mg total) by mouth daily. 12/25/19 03/24/20  [name], MD  Blood Glucose Monitoring Suppl (ACCU-CHEK AVIVA PLUS) w/Device KIT Check blood sugar 1 time a day 03/08/18   Chundi, Verne Spurr, MD  cloNIDine (CATAPRES - DOSED IN MG/24 HR) 0.3 mg/24hr patch APPLY 1 PATCH TO SKIN EVERY 7 DAYS 10/09/19   Chundi, Verne Spurr, MD  dorzolamide-timolol (COSOPT) 22.3-6.8 MG/ML ophthalmic solution Place 1 drop into both eyes 2 (two) times daily. 05/16/18   [provider]  furosemide (LASIX) 10 MG/ML solution TAKE 8ML BY MOUTH EVERY MORNING AND 8ML BY MOUTH EVERY EVENING 12/25/19   Chundi, Verne Spurr, MD  GAVILAX 17 GM/SCOOP powder Take 17 grams by mouth 2 times daily 05/08/19   Chundi, [name], MD  glucose blood (ACCU-CHEK AVIVA PLUS) test strip USE 1 TIME DAILY TO CHECK BLOOD SUGAR 09/03/17   Chundi, [name], MD  glucose blood (ACCU-CHEK AVIVA) test strip Use as instructed 05/03/11 09/03/18  Othella Boyer, MD  hydrALAZINE (APRESOLINE) 25 MG tablet Take 3  tablets (75 mg total) by mouth 3 (three) times daily. 12/25/19   [name], MD  JANUVIA 25 MG tablet TAKE 1 TABLET BY MOUTH EVERY DAY 05/22/19   Bartholomew Crews, MD  Lancets Misc. (ACCU-CHEK SOFTCLIX LANCET DEV) KIT Use to check blood sugar one time a day 09/03/17   Chundi, [name], MD  lidocaine (LIDODERM) 5 % Place 1 patch onto the skin daily. Remove & Discard patch within 12 hours or as directed by MD 08/26/18   Rodell Perna A, PA-C  LUMIGAN 0.01 % SOLN Place 1 drop into both eyes at bedtime.  12/05/13   [provider]  Melatonin 1 MG/ML LIQD TAKE 3.5 mls BY MOUTH AT BEDTIME AS NEEDED 10/03/19   Chundi, Verne Spurr, MD  polyethylene glycol (MIRALAX) packet Take 17 g by mouth 2 (two) times daily. 03/25/17   [name], MD  warfarin (COUMADIN) 5 MG tablet Take 1 tablet (5 mg total) by mouth daily at 4 PM. 12/03/19 03/05/20  Pennie Banter, RPH-CPP    Allergies  Diltiazem and Metoprolol  Review of Systems   Review of Systems  Unable to perform ROS: Dementia    Physical Exam Updated Vital Signs BP (!) 203/81   Pulse 66   Temp 98.5 F (36.9 C) (Oral)   Resp 18   Ht '5\' 5"'$  (1.651 m)   Wt 58 kg   SpO2 100%   BMI 21.28 kg/m   Physical Exam Vitals and nursing note reviewed.  Constitutional:      General: She is not in acute distress.    Appearance: She is well-developed.  HENT:     Head: Normocephalic.   Eyes:     Conjunctiva/sclera: Conjunctivae normal.  Cardiovascular:     Rate and Rhythm: Normal rate and regular rhythm.  Pulmonary:     Effort: Pulmonary effort is normal. No respiratory distress.     Breath sounds: Normal breath sounds. No stridor.  Abdominal:     General: There is no distension.  Skin:    General: Skin is warm and dry.  Neurological:     Mental Status: She is alert.     Cranial Nerves: No cranial nerve deficit.     Motor: No weakness.      ED Results / Procedures / Treatments   Labs (all labs ordered are listed, but only abnormal  results are displayed) Labs Reviewed  COMPREHENSIVE METABOLIC PANEL - Abnormal; Notable for the following components:      Result Value   Potassium 3.3 (*)    Chloride 96 (*)    Glucose, Bld 165 (*)    BUN 40 (*)    Creatinine, Ser 3.35 (*)    GFR calc non Af Amer 11 (*)    GFR calc Af Amer 13 (*)    Anion gap 18 (*)    All other components within normal limits  PROTIME-INR - Abnormal; Notable for the following components:   INR 1.3 (*)    All other components within normal limits  I-STAT CHEM 8, ED - Abnormal; Notable for the following components:   Potassium 3.3 (*)    BUN 39 (*)    Creatinine, Ser 3.40 (*)    Glucose, Bld 160 (*)    Calcium, Ion 1.11 (*)    Hemoglobin 11.9 (*)    HCT 35.0 (*)    All other components within normal limits  CBC  ETHANOL  LACTIC ACID, PLASMA  URINALYSIS, ROUTINE W REFLEX MICROSCOPIC  SAMPLE TO BLOOD BANK    EKG None  Radiology CT HEAD WO CONTRAST  Result Date: 12/31/2019 CLINICAL DATA:  Tripped and fell, anticoagulated EXAM: CT HEAD WITHOUT CONTRAST TECHNIQUE: Contiguous axial images were obtained from the base of the skull through the vertex without intravenous contrast. COMPARISON:  02/01/2016 FINDINGS: Brain: Chronic encephalomalacia within the right cerebral hemisphere consistent with prior MCA territory infarct. There are chronic small-vessel ischemic changes throughout the periventricular white matter and bilateral basal ganglia. No signs of acute infarct or hemorrhage. Lateral ventricles and midline structures are stable. There are no acute extra-axial fluid collections. No mass effect. Vascular: No hyperdense vessel or unexpected calcification. Skull: Normal. Negative for fracture or focal lesion. Sinuses/Orbits: No acute finding. Other: None. IMPRESSION: 1. Extensive chronic ischemic changes as above, stable since prior study. 2. No acute intracranial process. Electronically Signed   By: Randa Ngo M.D.   On: 12/31/2019 18:10   CT  CERVICAL SPINE WO CONTRAST  Result Date: 12/31/2019 CLINICAL DATA:  Tripped and fell EXAM: CT CERVICAL SPINE WITHOUT CONTRAST  TECHNIQUE: Multidetector CT imaging of the cervical spine was performed without intravenous contrast. Multiplanar CT image reconstructions were also generated. COMPARISON:  07/11/2018 FINDINGS: Alignment: Stable mild cervical kyphosis consistent with multilevel degenerative change. Otherwise alignment is anatomic. Skull base and vertebrae: No acute displaced fracture. Soft tissues and spinal canal: Extensive atherosclerosis of all visualized vasculature. Prevertebral soft tissues are unremarkable. No visible canal hematoma. Disc levels: Severe hypertrophic changes at the C1-C2 interface are stable. There is diffuse multilevel spondylosis and facet hypertrophy unchanged since prior exam. Upper chest: Airway is patent.  Lung apices are clear. Other: Reconstructed images demonstrate no additional findings. IMPRESSION: 1. No acute cervical spine fracture. 2. Extensive multilevel degenerative changes, stable. 3. Severe atherosclerosis. Electronically Signed   By: Randa Ngo M.D.   On: 12/31/2019 18:12   DG Pelvis Portable  Result Date: 12/31/2019 CLINICAL DATA:  [place] EXAM: PORTABLE PELVIS 1-2 VIEWS COMPARISON:  08/26/2018 FINDINGS: Single frontal view of the pelvis demonstrates no acute displaced fracture. Symmetrical bilateral hip osteoarthritis unchanged. Alignment is anatomic. There is severe lower lumbar spondylosis. Diffuse vascular calcifications are noted. IMPRESSION: 1. Stable degenerative changes, no acute fracture. Electronically Signed   By: Randa Ngo M.D.   On: 12/31/2019 17:21   CT MAXILLOFACIAL WO CONTRAST  Result Date: 12/31/2019 CLINICAL DATA:  Tripped and fell, facial trauma EXAM: CT MAXILLOFACIAL WITHOUT CONTRAST TECHNIQUE: Multidetector CT imaging of the maxillofacial structures was performed. Multiplanar CT image reconstructions were also generated. COMPARISON:   None. FINDINGS: Osseous: No acute displaced facial bone fractures. Orbits: Negative. No traumatic or inflammatory finding. Postsurgical changes left lobe. Sinuses: Clear. Soft tissues: Negative. Limited intracranial: No significant or unexpected finding. IMPRESSION: 1. No acute facial bone fracture. Electronically Signed   By: Randa Ngo M.D.   On: 12/31/2019 18:13    Procedures Procedures (including critical care time)  Medications Ordered in ED Medications - No data to display  ED Course  I have reviewed the triage vital signs and the nursing notes.  Pertinent labs & imaging results that were available during my care of the patient were reviewed by me and considered in my medical decision making (see chart for details).    6:36 PM On repeat exam the patient is awake, alert, fidgety, but in no distress. Discussed findings with patient and her daughter primarily. I reviewed the CT imaging, labs, CT is reassuring with no intracranial hemorrhage, no facial fracture, cervical spine fracture, skull fracture. Labs notable for slightly elevated creatinine compared to baseline.  Today it is 3.4, earlier in the year was 3, and I discussed this at length with the patient's daughter. Patient has known CKD, and family struggling to have her drink fluids, but they will continue to encourage this. No evidence for acute decline but rather progression, which again was discussed at length.  Daughter amenable to discharge with outpatient follow-up.  MDM Number of Diagnoses or Management Options Fall: new, needed workup   Amount and/or Complexity of Data Reviewed Clinical lab tests: reviewed Tests in the radiology section of CPT: reviewed Tests in the medicine section of CPT: reviewed Decide to obtain previous medical records or to obtain history from someone other than the patient: yes Obtain history from someone other than the patient: yes Review and summarize past medical records:  yes Independent visualization of images, tracings, or specimens: yes  Risk of Complications, Morbidity, and/or Mortality Presenting problems: high Diagnostic procedures: high Management options: high   Final Clinical Impression(s) / ED Diagnoses Final diagnoses:  Fall  Dehydration  Contusion of face, initial encounter     Carmin Muskrat, MD 12/31/19 Bosie Helper

## 2019-12-31 NOTE — ED Triage Notes (Addendum)
Pt arrives POV for eval of fall after tripping with her walker. Pt fell while trying to go down steps. Pt is anticoagulated on coumadin. Pt did strike head

## 2019-12-31 NOTE — Progress Notes (Signed)
Anticoagulation Management Chelsea Mcdaniel is a 86 y.o. female who reports to the clinic for monitoring of warfarin treatment.    Indication: DVT, History of (resolved); Long term current use of anticoagulation.   Duration: indefinite Supervising physician: Lalla Brothers  Anticoagulation Clinic Visit History: Patient does report signs/symptoms of bleeding or thromboembolism (See patient findings of documented bleeding.) Other recent changes: No diet, medications, lifestyle changes.  Anticoagulation Episode Summary    Current INR goal:  2.0-3.0  TTR:  65.1 % (5.9 y)  Next INR check:  01/14/2020  INR from last check:    Weekly max warfarin dose:    Target end date:    INR check location:    Preferred lab:    Send INR reminders to:     Indications   EMBOLISM/THROMBOSIS DEEP VSL LWR EXTRM NOS (Resolved) [I82.409] Long-term (current) use of anticoagulants [Z79.01]       Comments:          Allergies  Allergen Reactions  . Diltiazem Other (See Comments)    Symptomatic bradaycardia  . Metoprolol Other (See Comments)    Symptomatic bradycardia    Current Outpatient Medications:  .  Accu-Chek Softclix Lancets lancets, Use to check blood sugar 3 times daily. DIAG CODE E11.9, Disp: 300 each, Rfl: 1 .  allopurinol (ZYLOPRIM) 100 MG tablet, TAKE 1/2 TABLET BY MOUTH EVERY OTHER DAY, Disp: 45 tablet, Rfl: 1 .  amLODipine (NORVASC) 5 MG tablet, Take 1 tablet (5 mg total) by mouth daily., Disp: 90 tablet, Rfl: 0 .  Blood Glucose Monitoring Suppl (ACCU-CHEK AVIVA PLUS) w/Device KIT, Check blood sugar 1 time a day, Disp: 1 kit, Rfl: 0 .  cloNIDine (CATAPRES - DOSED IN MG/24 HR) 0.3 mg/24hr patch, APPLY 1 PATCH TO SKIN EVERY 7 DAYS, Disp: 12 patch, Rfl: 1 .  dorzolamide-timolol (COSOPT) 22.3-6.8 MG/ML ophthalmic solution, Place 1 drop into both eyes 2 (two) times daily., Disp: , Rfl: 3 .  furosemide (LASIX) 10 MG/ML solution, TAKE 8ML BY MOUTH EVERY MORNING AND 8ML BY MOUTH EVERY EVENING,  Disp: 480 mL, Rfl: 1 .  GAVILAX 17 GM/SCOOP powder, Take 17 grams by mouth 2 times daily, Disp: 850 g, Rfl: 2 .  glucose blood (ACCU-CHEK AVIVA PLUS) test strip, USE 1 TIME DAILY TO CHECK BLOOD SUGAR, Disp: 100 each, Rfl: 3 .  hydrALAZINE (APRESOLINE) 25 MG tablet, Take 3 tablets (75 mg total) by mouth 3 (three) times daily., Disp: , Rfl:  .  JANUVIA 25 MG tablet, TAKE 1 TABLET BY MOUTH EVERY DAY, Disp: 90 tablet, Rfl: 1 .  Lancets Misc. (ACCU-CHEK SOFTCLIX LANCET DEV) KIT, Use to check blood sugar one time a day, Disp: 1 kit, Rfl: 2 .  lidocaine (LIDODERM) 5 %, Place 1 patch onto the skin daily. Remove & Discard patch within 12 hours or as directed by MD, Disp: 30 patch, Rfl: 0 .  LUMIGAN 0.01 % SOLN, Place 1 drop into both eyes at bedtime. , Disp: , Rfl:  .  Melatonin 1 MG/ML LIQD, TAKE 3.5 mls BY MOUTH AT BEDTIME AS NEEDED, Disp: 58 mL, Rfl: 1 .  polyethylene glycol (MIRALAX) packet, Take 17 g by mouth 2 (two) times daily., Disp: 180 packet, Rfl: 1 .  warfarin (COUMADIN) 5 MG tablet, Take 1 tablet (5 mg total) by mouth daily at 4 PM., Disp: 31 tablet, Rfl: 2 .  glucose blood (ACCU-CHEK AVIVA) test strip, Use as instructed, Disp: 100 each, Rfl: 12 Past Medical History:  Diagnosis Date  .  Anemia     normocytic anemia with baseline hemoglobin 10-11  . Blindness of left eye     likely related to stroke, left cataract removed from that eye with complications  . Calculus gallbladder and bile duct with cholecystitis with obstruction 01/2015  . Chronic kidney disease 2006    left renal artery stenosis with probable hemodynamic significance, kidneys are normal in morphology without focal lesions or hydronephrosis this is based but cannot A. of the abdomen with and without contrast done on the 31st 2006  . CKD (chronic kidney disease), stage III   . CVA (cerebral vascular accident) ([place]) 1990's  . CVA (cerebrovascular accident) [facility])  October 2007    CT of the head atrophy with multiple remote  insults noted but no definite acute findings  . Glaucoma   . Gout   . Hyperlipidemia   . Hypertension   . Personal history of DVT (deep vein thrombosis) 08/11/2010   BLE  . Type II diabetes mellitus ([place])    Social History   Socioeconomic History  . Marital status: Widowed    Spouse name: Not on file  . Number of children: Not on file  . Years of education: Not on file  . Highest education level: Not on file  Occupational History  . Not on file  Tobacco Use  . Smoking status: Never Smoker  . Smokeless tobacco: Never Used  Substance and Sexual Activity  . Alcohol use: No    Alcohol/week: 0.0 standard drinks  . Drug use: No  . Sexual activity: Not Currently  Other Topics Concern  . Not on file  Social History Narrative    Patient is a widow. She has 11 children 5 of who are living. She is retired in 1993 from [employer]. She denies tobacco alcohol or drug use.   Social Determinants of Health   Financial Resource Strain:   . Difficulty of Paying Living Expenses:   Food Insecurity:   . Worried About Charity fundraiser in the Last Year:   . Arboriculturist in the Last Year:   Transportation Needs:   . Film/video editor (Medical):   Marland Kitchen Lack of Transportation (Non-Medical):   Physical Activity:   . Days of Exercise per Week:   . Minutes of Exercise per Session:   Stress:   . Feeling of Stress :   Social Connections:   . Frequency of Communication with Friends and Family:   . Frequency of Social Gatherings with Friends and Family:   . Attends Religious Services:   . Active Member of Clubs or Organizations:   . Attends Archivist Meetings:   Marland Kitchen Marital Status:    Family History  Problem Relation Age of Onset  . Heart disease Mother   . Diabetes Mother   . Hyperlipidemia Mother   . Hypertension Mother   . Heart disease Father   . Diabetes Father   . Hyperlipidemia Father   . Hypertension Father     ASSESSMENT Recent Results: The most recent  result is correlated with 35 mg per week: Lab Results  Component Value Date   INR 1.3 (A) 12/31/2019   INR 2.2 12/03/2019   INR 2.9 11/05/2019    Anticoagulation Dosing: Description   Take one (1)  of your '5mg'$  peach-colored warfarin tablets by mouth daily.      INR today: Subtherapeutic  PLAN Weekly dose was unchanged. Patient is being taken to the ED by [facility] staff member for  evaluation of traumatic fall, on coumadin.    Patient Instructions  Patient instructed to take medications as defined in the Anti-coagulation Track section of this encounter.  Patient instructed to take today's dose. (Had ALREADY TAKEN BEFORE ARRIVAL TO CLINIC) Patient instructed to take one (1)  of your '5mg'$  peach-colored warfarin tablets by mouth daily. ASK THE EMERGENCY ROOM PHYSICIAN WHEN YOU MAY RESUME TAKING WARFARIN, RELATIVE TO HIS/HER FINDINGS TODAY! Patient verbalized understanding of these instructions.    Patient advised to contact clinic or seek medical attention if signs/symptoms of bleeding or thromboembolism occur.  Patient verbalized understanding by repeating back information and was advised to contact me if further medication-related questions arise. Patient was also provided an information handout.  Follow-up Return in 2 weeks (on 01/14/2020) for Follow up INR.  Pennie Banter, PharmD, CPP  15 minutes spent face-to-face with the patient during the encounter. 50% of time spent on education, including signs/sx bleeding and clotting, as well as food and drug interactions with warfarin. 50% of time was spent on fingerprick POC INR sample collection,processing, results determination, and documentation in http://www.kim.net/.

## 2019-12-31 NOTE — Discharge Instructions (Signed)
As discussed, your evaluation today has been largely reassuring.  But, it is important that you monitor your condition carefully, and do not hesitate to return to the ED if you develop new, or concerning changes in your condition. ? ?Otherwise, please follow-up with your physician for appropriate ongoing care. ? ?

## 2020-01-01 ENCOUNTER — Other Ambulatory Visit: Payer: Self-pay | Admitting: Internal Medicine

## 2020-01-01 DIAGNOSIS — E1159 Type 2 diabetes mellitus with other circulatory complications: Secondary | ICD-10-CM

## 2020-01-01 NOTE — Telephone Encounter (Signed)
Agree with ED visit. Thank you

## 2020-01-01 NOTE — Progress Notes (Signed)
I evaluated the patient in person. She had a fall while on her way to the clinic. Had minor bleeding at the nose and a loose tooth at the front mandible. She was awake, alert, but answering questions slowly. We referred her to the ED for CT head imaging and observation pending those results. It looks like those were completed and were reassuring with no signs of bleeding and she was discharged to home. We will follow up with her per usual to manage her subtherapeutic INR.

## 2020-01-02 ENCOUNTER — Emergency Department (HOSPITAL_COMMUNITY): Payer: Medicare Other

## 2020-01-02 ENCOUNTER — Other Ambulatory Visit: Payer: Self-pay

## 2020-01-02 ENCOUNTER — Emergency Department (HOSPITAL_COMMUNITY)
Admission: EM | Admit: 2020-01-02 | Discharge: 2020-01-02 | Disposition: A | Discharge status: Home / Self Care | Payer: Medicare Other | Source: Home / Self Care | Attending: Emergency Medicine | Admitting: Emergency Medicine

## 2020-01-02 ENCOUNTER — Encounter (HOSPITAL_COMMUNITY): Payer: Self-pay

## 2020-01-02 DIAGNOSIS — I639 Cerebral infarction, unspecified: Secondary | ICD-10-CM

## 2020-01-02 DIAGNOSIS — Z7901 Long term (current) use of anticoagulants: Secondary | ICD-10-CM

## 2020-01-02 DIAGNOSIS — W19XXXA Unspecified fall, initial encounter: Secondary | ICD-10-CM

## 2020-01-02 HISTORY — DX: Type 2 diabetes mellitus without complications: E11.9

## 2020-01-02 HISTORY — DX: Cerebral infarction, unspecified: I63.9

## 2020-01-02 LAB — CBC
HCT: 35.7 % — ABNORMAL LOW (ref 36.0–46.0)
Hemoglobin: 11.4 g/dL — ABNORMAL LOW (ref 12.0–15.0)
MCH: 30.8 pg (ref 26.0–34.0)
MCHC: 31.9 g/dL (ref 30.0–36.0)
MCV: 96.5 fL (ref 80.0–100.0)
Platelets: 302 10*3/uL (ref 150–400)
RBC: 3.7 MIL/uL — ABNORMAL LOW (ref 3.87–5.11)
RDW: 14.6 % (ref 11.5–15.5)
WBC: 6.4 10*3/uL (ref 4.0–10.5)
nRBC: 0 % (ref 0.0–0.2)

## 2020-01-02 LAB — BASIC METABOLIC PANEL
Anion gap: 14 (ref 5–15)
BUN: 38 mg/dL — ABNORMAL HIGH (ref 8–23)
CO2: 23 mmol/L (ref 22–32)
Calcium: 9.2 mg/dL (ref 8.9–10.3)
Chloride: 101 mmol/L (ref 98–111)
Creatinine, Ser: 3.33 mg/dL — ABNORMAL HIGH (ref 0.44–1.00)
GFR calc Af Amer: 13 mL/min — ABNORMAL LOW (ref >60)
GFR calc non Af Amer: 12 mL/min — ABNORMAL LOW (ref >60)
Glucose, Bld: 190 mg/dL — ABNORMAL HIGH (ref 70–99)
Potassium: 3.3 mmol/L — ABNORMAL LOW (ref 3.5–5.1)
Sodium: 138 mmol/L (ref 135–145)

## 2020-01-02 LAB — PROTIME-INR
INR: 1.6 — ABNORMAL HIGH (ref 0.8–1.2)
Prothrombin Time: 18.1 seconds — ABNORMAL HIGH (ref 11.4–15.2)

## 2020-01-02 NOTE — Discharge Instructions (Signed)
1.  Schedule follow-up with your family doctor soon as possible. 2.  Your CT scan indicates that you have had another small stroke.  Try to be as careful as possible, you are more likely to be unsteady and at risk for falls. 3.  Since you take Coumadin, you are at increased risk for injuries and bleeding from falls.  Discussed the risks and benefits of Coumadin with your family doctor given your fall risk and stroke risk.

## 2020-01-02 NOTE — ED Provider Notes (Signed)
[facility] EMERGENCY DEPARTMENT Provider Note   CSN: [id number] Arrival date & time:        History Chief Complaint  Patient presents with  . Fall    Chelsea Mcdaniel is a 86 y.o. female.  HPI    Patient was seated in her wheelchair.  She was going out her caregiver.  The wheels of the wheelchair were not locked.  The patient leaned forward to check some flowers and the chair went forward on a slight incline tipping her out face forward.  Patient's glasses abraded her forehead.  No loss of consciousness.  Patient is denying any headache.  Patient denies any pain anywhere.  She was assisted to a standing position per EMS by family members.  He was functioning at baseline for weightbearing and extremity use.  Patient can weight-bear but at baseline reportedly is in a wheelchair due to significant gait instability.  She is anticoagulated on Coumadin. Past Medical History:  Diagnosis Date  . Diabetes mellitus without complication ([place])   . Stroke [facility])     There are no problems to display for this patient.      OB History   No obstetric history on file.     No family history on file.  Social History   Tobacco Use  . Smoking status: Never Smoker  . Smokeless tobacco: Never Used  Substance Use Topics  . Alcohol use: Not Currently  . Drug use: Never    Home Medications Prior to Admission medications   Medication Sig Start Date End Date Taking? Authorizing Provider  allopurinol (ZYLOPRIM) 100 MG tablet Take 100 mg by mouth daily.  12/31/19  Yes [provider]  amLODipine (NORVASC) 10 MG tablet Take 10 mg by mouth daily.  12/31/19  Yes [provider]  calcitRIOL (ROCALTROL) 0.25 MCG capsule Take 0.25 mcg by mouth daily. 12/31/19  Yes [provider]  dorzolamide-timolol (COSOPT) 22.3-6.8 MG/ML ophthalmic solution Place 1 drop into both eyes 2 (two) times daily. 11/17/19  Yes [provider]  furosemide (LASIX) 10 MG/ML  solution Take 8 mLs by mouth in the morning and at bedtime. 12/25/19  Yes [provider]  hydrALAZINE (APRESOLINE) 50 MG tablet Take 50 mg by mouth 3 (three) times daily. 11/13/19  Yes [provider]  JANUVIA 25 MG tablet Take 25 mg by mouth daily. 01/01/20  Yes [provider]  latanoprost (XALATAN) 0.005 % ophthalmic solution Place 1 drop into both eyes at bedtime. 11/17/19  Yes [provider]  LUMIGAN 0.01 % SOLN Place 1 drop into both eyes at bedtime. 12/12/19  Yes [provider]  Melatonin 1 MG/ML LIQD Take 3 mLs by mouth at bedtime as needed for sleep. 09/13/19  Yes [provider]  polyethylene glycol powder (GLYCOLAX/MIRALAX) 17 GM/SCOOP powder Take 17 g by mouth 2 (two) times daily. 11/07/19  Yes [provider]  warfarin (COUMADIN) 5 MG tablet Take 5 mg by mouth daily. 12/31/19  Yes [provider]    Allergies    Diltiazem and Metoprolol  Review of Systems   Review of Systems Level 5 caveat cannot obtain review of systems due to dementia and acute injury. Physical Exam Updated Vital Signs BP (!) 175/75 (BP Location: Right Arm)   Pulse 78   Temp 98 F (36.7 C) (Temporal)   Resp 18   Ht 5\' 6"  (1.676 m)   Wt 68 kg   SpO2 99%   BMI 24.21 kg/m  Physical Exam Constitutional:      Comments: Patient is alert and answering questions.  No acute distress.  GCS of 15.  HENT:     Head:     Comments: Minor abrasion to the glabella.  No active bleeding.  Very superficial scratches around the mouth.  No active bleeding.  No nasal bleeding.  Airway patent. Eyes:     Comments: Patient appears to have a baseline disconjugate gaze on the left.  She follows commands for extraocular motions.  Neck:     Comments: Cervical collar maintained. Cardiovascular:     Comments: Heart regular. Pulmonary:     Comments: No respiratory distress grossly clear anteriorly.  Patient denies pain to compression of the chest wall.  No  crepitus. Abdominal:     General: There is no distension.     Palpations: Abdomen is soft.     Tenderness: There is no abdominal tenderness.  Musculoskeletal:     Comments: Patient has significant swelling of the lower portion of the right lower leg.  This appears chronic.  Patient denies pain to palpation.  No other extremity deformities.  Patient will follow commands for moving all extremities.  Skin:    General: Skin is warm and dry.  Neurological:     Comments: Patient is alert.  She is answering questions.  She is following commands.  No focal deficits.  Psychiatric:        Mood and Affect: Mood normal.     ED Results / Procedures / Treatments   Labs (all labs ordered are listed, but only abnormal results are displayed) Labs Reviewed  BASIC METABOLIC PANEL - Abnormal; Notable for the following components:      Result Value   Potassium 3.3 (*)    Glucose, Bld 190 (*)    BUN 38 (*)    Creatinine, Ser 3.33 (*)    GFR calc non Af Amer 12 (*)    GFR calc Af Amer 13 (*)    All other components within normal limits  CBC - Abnormal; Notable for the following components:   RBC 3.70 (*)    Hemoglobin 11.4 (*)    HCT 35.7 (*)    All other components within normal limits  PROTIME-INR - Abnormal; Notable for the following components:   Prothrombin Time 18.1 (*)    INR 1.6 (*)    All other components within normal limits    EKG None  Radiology CT Head Wo Contrast  Result Date: 01/02/2020 CLINICAL DATA:  [place], facial trauma EXAM: CT HEAD WITHOUT CONTRAST TECHNIQUE: Contiguous axial images were obtained from the base of the skull through the vertex without intravenous contrast. COMPARISON:  12/31/2019 FINDINGS: Brain: Since the prior exam 12/31/2019, and inferior right occipital infarct has developed. Extensive chronic small vessel ischemic changes are again seen throughout the periventricular and subcortical white matter. No acute hemorrhage. Lateral ventricles and midline  structures are unremarkable. No acute extra-axial fluid collections. No mass effect. Vascular: No hyperdense vessel or unexpected calcification. Skull: Normal. Negative for fracture or focal lesion. Sinuses/Orbits: No acute finding. Other: None. IMPRESSION: 1. Acute to subacute right occipital cortical infarct, new since prior head CT 12/31/2019. 2. No acute intracranial hemorrhage. Electronically Signed   By: Randa Ngo M.D.   On: 01/02/2020 18:14   CT Cervical Spine Wo Contrast  Result Date: 01/02/2020 CLINICAL DATA:  [place] [address] from wheelchair, facial and neck trauma EXAM: CT CERVICAL SPINE WITHOUT CONTRAST TECHNIQUE: Multidetector CT imaging of the cervical spine was  performed without intravenous contrast. Multiplanar CT image reconstructions were also generated. COMPARISON:  12/31/2019 FINDINGS: Alignment: Stable kyphosis of the cervical spine. Otherwise alignment is anatomic. Skull base and vertebrae: No acute displaced fracture. Soft tissues and spinal canal: Extensive atherosclerosis of all visualized arterial structures. Prevertebral soft tissues are normal. No visible canal hematoma. Disc levels: Extensive multilevel cervical spondylosis and facet hypertrophy seen previously is unchanged. Marked hypertrophic changes at the C1/C2 interface. Upper chest: Airway is patent.  Lung apices are clear. Other: Reconstructed images demonstrate no additional findings. IMPRESSION: 1. Stable extensive cervical degenerative changes. No acute cervical spine fracture. Electronically Signed   By: Randa Ngo M.D.   On: 01/02/2020 18:16   DG Pelvis Portable  Result Date: 01/02/2020 CLINICAL DATA:  [place] out of wheelchair EXAM: PORTABLE PELVIS 1-2 VIEWS COMPARISON:  12/31/2019 FINDINGS: Single frontal view of the pelvis demonstrates no acute displaced fractures. The hips are well aligned. Stable symmetrical hip osteoarthritis. Extensive vascular calcifications again noted. IMPRESSION: 1. Stable exam, no acute fracture.  Electronically Signed   By: Randa Ngo M.D.   On: 01/02/2020 16:36   DG Chest Portable 1 View  Result Date: 01/02/2020 CLINICAL DATA:  Trauma, fell out of wheelchair EXAM: PORTABLE CHEST 1 VIEW COMPARISON:  02/05/2016 FINDINGS: Single frontal view of the chest demonstrates a stable cardiac silhouette. Diffuse interstitial scarring is unchanged, without airspace disease, effusion, or pneumothorax. No acute displaced fracture. Severe bilateral shoulder osteoarthritis. IMPRESSION: 1. No acute intrathoracic process. Electronically Signed   By: Randa Ngo M.D.   On: 01/02/2020 16:35    Procedures Procedures (including critical care time)  Medications Ordered in ED Medications - No data to display  ED Course  I have reviewed the triage vital signs and the nursing notes.  Pertinent labs & imaging results that were available during my care of the patient were reviewed by me and considered in my medical decision making (see chart for details).    MDM Rules/Calculators/A&P                     Patient's daughter-in-law is her main caregiver.  She reports today's fall occurred because she was not familiar with the wheelchair and the patient was able to get it rolling forward with her feet.  Her daughter-in-law however notes that she has been somewhat more confused and repetitive in her speech as well as unsteady since her fall 2 days ago.  She reports her doctor thought that was likely due to some decline from the previous fall although, in light of CT findings today of subacute stroke, it appears she may have had a mild CVA a couple days ago precipitating the first event.  Patient takes Coumadin 5 mg daily.  At this time, risk benefit of continuing Coumadin is equivocal.  Patient certainly has significant fall risk but also stroke risk.  No intracerebral bleeding identified today.  Family will continue to care for the patient at home.  My recommendation is to discuss the risk-benefit profile of  Coumadin in this patient ASAP with her provider.  Patient is alert.  She answers questions.  No respiratory distress.  No pain complaints. Final Clinical Impression(s) / ED Diagnoses Final diagnoses:  Fall, initial encounter  Cerebrovascular accident (CVA), unspecified mechanism ([place])  Anticoagulated on Coumadin    Rx / DC Orders ED Discharge Orders    None       Charlesetta Shanks, MD 01/02/20 [phone]

## 2020-01-02 NOTE — ED Triage Notes (Signed)
Pt BIB [facility] for eval of fall after rolling down very small hill in wheelchair and falling facefirst in yard. Pt anticoagulated on coumadin. Small abrasion to face. Confused at baseline

## 2020-01-03 ENCOUNTER — Encounter: Payer: Self-pay | Admitting: Internal Medicine

## 2020-01-03 ENCOUNTER — Other Ambulatory Visit: Payer: Self-pay

## 2020-01-03 ENCOUNTER — Inpatient Hospital Stay (HOSPITAL_COMMUNITY)
Admission: RE | Admit: 2020-01-03 | Discharge: 2020-01-08 | DRG: 064 | Disposition: A | Discharge status: Skilled Nursing Facility | Payer: Medicare Other | Source: Ambulatory Visit | Attending: Internal Medicine | Admitting: Internal Medicine

## 2020-01-03 ENCOUNTER — Inpatient Hospital Stay (HOSPITAL_COMMUNITY): Payer: Medicare Other

## 2020-01-03 ENCOUNTER — Ambulatory Visit (INDEPENDENT_AMBULATORY_CARE_PROVIDER_SITE_OTHER): Payer: Medicare Other | Admitting: Internal Medicine

## 2020-01-03 ENCOUNTER — Telehealth: Payer: Self-pay | Admitting: Internal Medicine

## 2020-01-03 ENCOUNTER — Encounter (HOSPITAL_COMMUNITY): Payer: Self-pay

## 2020-01-03 VITALS — BP 181/64 | HR 76 | Temp 97.7°F | Ht 65.0 in | Wt 128.0 lb

## 2020-01-03 DIAGNOSIS — I639 Cerebral infarction, unspecified: Secondary | ICD-10-CM

## 2020-01-03 DIAGNOSIS — R279 Unspecified lack of coordination: Secondary | ICD-10-CM | POA: Diagnosis not present

## 2020-01-03 DIAGNOSIS — Z86718 Personal history of other venous thrombosis and embolism: Secondary | ICD-10-CM

## 2020-01-03 DIAGNOSIS — M2550 Pain in unspecified joint: Secondary | ICD-10-CM | POA: Diagnosis not present

## 2020-01-03 DIAGNOSIS — I110 Hypertensive heart disease with heart failure: Secondary | ICD-10-CM | POA: Diagnosis not present

## 2020-01-03 DIAGNOSIS — R6889 Other general symptoms and signs: Secondary | ICD-10-CM | POA: Diagnosis not present

## 2020-01-03 DIAGNOSIS — R262 Difficulty in walking, not elsewhere classified: Secondary | ICD-10-CM | POA: Diagnosis not present

## 2020-01-03 DIAGNOSIS — E119 Type 2 diabetes mellitus without complications: Secondary | ICD-10-CM | POA: Diagnosis not present

## 2020-01-03 DIAGNOSIS — R1319 Other dysphagia: Secondary | ICD-10-CM | POA: Diagnosis not present

## 2020-01-03 DIAGNOSIS — E43 Unspecified severe protein-calorie malnutrition: Secondary | ICD-10-CM | POA: Diagnosis not present

## 2020-01-03 DIAGNOSIS — Z7982 Long term (current) use of aspirin: Secondary | ICD-10-CM | POA: Diagnosis not present

## 2020-01-03 DIAGNOSIS — G4700 Insomnia, unspecified: Secondary | ICD-10-CM | POA: Diagnosis not present

## 2020-01-03 DIAGNOSIS — E1151 Type 2 diabetes mellitus with diabetic peripheral angiopathy without gangrene: Secondary | ICD-10-CM | POA: Diagnosis present

## 2020-01-03 DIAGNOSIS — I63431 Cerebral infarction due to embolism of right posterior cerebral artery: Secondary | ICD-10-CM

## 2020-01-03 DIAGNOSIS — R2681 Unsteadiness on feet: Secondary | ICD-10-CM | POA: Diagnosis not present

## 2020-01-03 DIAGNOSIS — H409 Unspecified glaucoma: Secondary | ICD-10-CM | POA: Diagnosis present

## 2020-01-03 DIAGNOSIS — Z20822 Contact with and (suspected) exposure to covid-19: Secondary | ICD-10-CM | POA: Diagnosis not present

## 2020-01-03 DIAGNOSIS — Z7984 Long term (current) use of oral hypoglycemic drugs: Secondary | ICD-10-CM

## 2020-01-03 DIAGNOSIS — I63531 Cerebral infarction due to unspecified occlusion or stenosis of right posterior cerebral artery: Principal | ICD-10-CM | POA: Diagnosis present

## 2020-01-03 DIAGNOSIS — D631 Anemia in chronic kidney disease: Secondary | ICD-10-CM | POA: Diagnosis present

## 2020-01-03 DIAGNOSIS — Z888 Allergy status to other drugs, medicaments and biological substances status: Secondary | ICD-10-CM

## 2020-01-03 DIAGNOSIS — S0083XA Contusion of other part of head, initial encounter: Secondary | ICD-10-CM | POA: Diagnosis present

## 2020-01-03 DIAGNOSIS — N184 Chronic kidney disease, stage 4 (severe): Secondary | ICD-10-CM | POA: Diagnosis not present

## 2020-01-03 DIAGNOSIS — Z7901 Long term (current) use of anticoagulants: Secondary | ICD-10-CM | POA: Diagnosis not present

## 2020-01-03 DIAGNOSIS — Z79899 Other long term (current) drug therapy: Secondary | ICD-10-CM

## 2020-01-03 DIAGNOSIS — W050XXA Fall from non-moving wheelchair, initial encounter: Secondary | ICD-10-CM | POA: Diagnosis present

## 2020-01-03 DIAGNOSIS — M109 Gout, unspecified: Secondary | ICD-10-CM | POA: Diagnosis present

## 2020-01-03 DIAGNOSIS — R5381 Other malaise: Secondary | ICD-10-CM | POA: Diagnosis not present

## 2020-01-03 DIAGNOSIS — I1310 Hypertensive heart and chronic kidney disease without heart failure, with stage 1 through stage 4 chronic kidney disease, or unspecified chronic kidney disease: Secondary | ICD-10-CM | POA: Diagnosis present

## 2020-01-03 DIAGNOSIS — R488 Other symbolic dysfunctions: Secondary | ICD-10-CM | POA: Diagnosis not present

## 2020-01-03 DIAGNOSIS — E1159 Type 2 diabetes mellitus with other circulatory complications: Secondary | ICD-10-CM | POA: Diagnosis present

## 2020-01-03 DIAGNOSIS — I342 Nonrheumatic mitral (valve) stenosis: Secondary | ICD-10-CM | POA: Diagnosis not present

## 2020-01-03 DIAGNOSIS — I6381 Other cerebral infarction due to occlusion or stenosis of small artery: Secondary | ICD-10-CM | POA: Diagnosis present

## 2020-01-03 DIAGNOSIS — R531 Weakness: Secondary | ICD-10-CM | POA: Diagnosis present

## 2020-01-03 DIAGNOSIS — I69354 Hemiplegia and hemiparesis following cerebral infarction affecting left non-dominant side: Secondary | ICD-10-CM | POA: Diagnosis not present

## 2020-01-03 DIAGNOSIS — E1169 Type 2 diabetes mellitus with other specified complication: Secondary | ICD-10-CM | POA: Diagnosis present

## 2020-01-03 DIAGNOSIS — H5462 Unqualified visual loss, left eye, normal vision right eye: Secondary | ICD-10-CM | POA: Diagnosis present

## 2020-01-03 DIAGNOSIS — G9389 Other specified disorders of brain: Secondary | ICD-10-CM | POA: Diagnosis present

## 2020-01-03 DIAGNOSIS — Z90710 Acquired absence of both cervix and uterus: Secondary | ICD-10-CM

## 2020-01-03 DIAGNOSIS — E860 Dehydration: Secondary | ICD-10-CM | POA: Diagnosis present

## 2020-01-03 DIAGNOSIS — F0390 Unspecified dementia without behavioral disturbance: Secondary | ICD-10-CM | POA: Diagnosis present

## 2020-01-03 DIAGNOSIS — I69954 Hemiplegia and hemiparesis following unspecified cerebrovascular disease affecting left non-dominant side: Secondary | ICD-10-CM | POA: Diagnosis not present

## 2020-01-03 DIAGNOSIS — Z743 Need for continuous supervision: Secondary | ICD-10-CM | POA: Diagnosis not present

## 2020-01-03 DIAGNOSIS — L720 Epidermal cyst: Secondary | ICD-10-CM | POA: Diagnosis not present

## 2020-01-03 DIAGNOSIS — M4802 Spinal stenosis, cervical region: Secondary | ICD-10-CM | POA: Diagnosis present

## 2020-01-03 DIAGNOSIS — G9341 Metabolic encephalopathy: Secondary | ICD-10-CM | POA: Diagnosis present

## 2020-01-03 DIAGNOSIS — E1122 Type 2 diabetes mellitus with diabetic chronic kidney disease: Secondary | ICD-10-CM | POA: Diagnosis present

## 2020-01-03 DIAGNOSIS — R2689 Other abnormalities of gait and mobility: Secondary | ICD-10-CM | POA: Diagnosis present

## 2020-01-03 DIAGNOSIS — M6281 Muscle weakness (generalized): Secondary | ICD-10-CM | POA: Diagnosis not present

## 2020-01-03 DIAGNOSIS — R1312 Dysphagia, oropharyngeal phase: Secondary | ICD-10-CM | POA: Diagnosis not present

## 2020-01-03 DIAGNOSIS — E785 Hyperlipidemia, unspecified: Secondary | ICD-10-CM | POA: Diagnosis not present

## 2020-01-03 DIAGNOSIS — S0990XA Unspecified injury of head, initial encounter: Secondary | ICD-10-CM | POA: Diagnosis present

## 2020-01-03 DIAGNOSIS — Z7401 Bed confinement status: Secondary | ICD-10-CM | POA: Diagnosis not present

## 2020-01-03 DIAGNOSIS — I350 Nonrheumatic aortic (valve) stenosis: Secondary | ICD-10-CM | POA: Diagnosis present

## 2020-01-03 LAB — TSH: TSH: 2.895 u[IU]/mL (ref 0.350–4.500)

## 2020-01-03 LAB — LIPID PANEL
Cholesterol: 235 mg/dL — ABNORMAL HIGH (ref 0–200)
HDL: 47 mg/dL (ref >40)
LDL Cholesterol: 159 mg/dL — ABNORMAL HIGH (ref 0–99)
Total CHOL/HDL Ratio: 5 RATIO
Triglycerides: 145 mg/dL (ref <150)
VLDL: 29 mg/dL (ref 0–40)

## 2020-01-03 LAB — CBC WITH DIFFERENTIAL/PLATELET
Abs Immature Granulocytes: 0.03 10*3/uL (ref 0.00–0.07)
Basophils Absolute: 0 10*3/uL (ref 0.0–0.1)
Basophils Relative: 1 %
Eosinophils Absolute: 0.2 10*3/uL (ref 0.0–0.5)
Eosinophils Relative: 3 %
HCT: 34.4 % — ABNORMAL LOW (ref 36.0–46.0)
Hemoglobin: 11.1 g/dL — ABNORMAL LOW (ref 12.0–15.0)
Immature Granulocytes: 0 %
Lymphocytes Relative: 20 %
Lymphs Abs: 1.3 10*3/uL (ref 0.7–4.0)
MCH: 31.1 pg (ref 26.0–34.0)
MCHC: 32.3 g/dL (ref 30.0–36.0)
MCV: 96.4 fL (ref 80.0–100.0)
Monocytes Absolute: 0.9 10*3/uL (ref 0.1–1.0)
Monocytes Relative: 13 %
Neutro Abs: 4.4 10*3/uL (ref 1.7–7.7)
Neutrophils Relative %: 63 %
Platelets: 291 10*3/uL (ref 150–400)
RBC: 3.57 MIL/uL — ABNORMAL LOW (ref 3.87–5.11)
RDW: 14.8 % (ref 11.5–15.5)
WBC: 6.9 10*3/uL (ref 4.0–10.5)
nRBC: 0 % (ref 0.0–0.2)

## 2020-01-03 LAB — COMPREHENSIVE METABOLIC PANEL
ALT: 11 U/L (ref 0–44)
AST: 23 U/L (ref 15–41)
Albumin: 3.5 g/dL (ref 3.5–5.0)
Alkaline Phosphatase: 75 U/L (ref 38–126)
Anion gap: 12 (ref 5–15)
BUN: 39 mg/dL — ABNORMAL HIGH (ref 8–23)
CO2: 23 mmol/L (ref 22–32)
Calcium: 9.1 mg/dL (ref 8.9–10.3)
Chloride: 102 mmol/L (ref 98–111)
Creatinine, Ser: 3.5 mg/dL — ABNORMAL HIGH (ref 0.44–1.00)
GFR calc Af Amer: 13 mL/min — ABNORMAL LOW (ref >60)
GFR calc non Af Amer: 11 mL/min — ABNORMAL LOW (ref >60)
Glucose, Bld: 164 mg/dL — ABNORMAL HIGH (ref 70–99)
Potassium: 3.4 mmol/L — ABNORMAL LOW (ref 3.5–5.1)
Sodium: 137 mmol/L (ref 135–145)
Total Bilirubin: 0.7 mg/dL (ref 0.3–1.2)
Total Protein: 6.8 g/dL (ref 6.5–8.1)

## 2020-01-03 LAB — PROTIME-INR
INR: 1.6 — ABNORMAL HIGH (ref 0.8–1.2)
Prothrombin Time: 18.3 seconds — ABNORMAL HIGH (ref 11.4–15.2)

## 2020-01-03 LAB — HEMOGLOBIN A1C
Hgb A1c MFr Bld: 6.4 % — ABNORMAL HIGH (ref 4.8–5.6)
Mean Plasma Glucose: 136.98 mg/dL

## 2020-01-03 LAB — APTT: aPTT: 28 seconds (ref 24–36)

## 2020-01-03 MED ORDER — INSULIN ASPART 100 UNIT/ML ~~LOC~~ SOLN
0.0000 [IU] | Freq: Three times a day (TID) | SUBCUTANEOUS | Status: DC
  Administered 2020-01-04 – 2020-01-07 (×3): 1 [IU] via SUBCUTANEOUS

## 2020-01-03 MED ORDER — WARFARIN - PHARMACIST DOSING INPATIENT: Freq: Every day | Status: DC

## 2020-01-03 MED ORDER — ACETAMINOPHEN 650 MG RE SUPP: 650.0000 mg | Freq: Four times a day (QID) | RECTAL | Status: DC | PRN

## 2020-01-03 MED ORDER — FUROSEMIDE 10 MG/ML IJ SOLN
40.0000 mg | Freq: Once | INTRAMUSCULAR | Status: AC
  Administered 2020-01-03: 40 mg via INTRAVENOUS
  Filled 2020-01-03: qty 4

## 2020-01-03 MED ORDER — ASPIRIN EC 81 MG PO TBEC: 81.0000 mg | DELAYED_RELEASE_TABLET | Freq: Every day | ORAL | Status: DC

## 2020-01-03 MED ORDER — WARFARIN SODIUM 7.5 MG PO TABS
7.5000 mg | ORAL_TABLET | Freq: Once | ORAL | Status: AC
  Administered 2020-01-03: 7.5 mg via ORAL
  Filled 2020-01-03: qty 1

## 2020-01-03 MED ORDER — CLOPIDOGREL BISULFATE 75 MG PO TABS: 75.0000 mg | ORAL_TABLET | Freq: Every day | ORAL | Status: DC

## 2020-01-03 MED ORDER — ASPIRIN 81 MG PO CHEW
81.0000 mg | CHEWABLE_TABLET | Freq: Every day | ORAL | Status: DC
  Administered 2020-01-04 – 2020-01-06 (×3): 81 mg via ORAL
  Filled 2020-01-03 (×3): qty 1

## 2020-01-03 MED ORDER — ASPIRIN 325 MG PO TABS
325.0000 mg | ORAL_TABLET | Freq: Once | ORAL | Status: AC
  Administered 2020-01-03: 325 mg via ORAL
  Filled 2020-01-03: qty 1

## 2020-01-03 MED ORDER — ACETAMINOPHEN 325 MG PO TABS: 650.0000 mg | ORAL_TABLET | Freq: Four times a day (QID) | ORAL | Status: DC | PRN

## 2020-01-03 MED ORDER — ATORVASTATIN CALCIUM 80 MG PO TABS
80.0000 mg | ORAL_TABLET | Freq: Every day | ORAL | Status: DC
  Administered 2020-01-04: 80 mg via ORAL
  Filled 2020-01-03: qty 1

## 2020-01-03 NOTE — Progress Notes (Signed)
CC: CVA  HPI:  [name] with a hx as noted below who presents today for ED visit follow-up. Pt sustained a fall out of her wheelchair and fell on her face. No LOC. She went to the ED where they found an acute-subacute infarct in the occipital lobe (compared to CT scan a few days prior) then subsequently discharged. Please refer to the problem based charting for further details.  Past Medical History:  Diagnosis Date  . Anemia     normocytic anemia with baseline hemoglobin 10-11  . Blindness of left eye     likely related to stroke, left cataract removed from that eye with complications  . Calculus gallbladder and bile duct with cholecystitis with obstruction 01/2015  . Chronic kidney disease 2006    left renal artery stenosis with probable hemodynamic significance, kidneys are normal in morphology without focal lesions or hydronephrosis this is based but cannot A. of the abdomen with and without contrast done on the 31st 2006  . CKD (chronic kidney disease), stage III   . CVA (cerebral vascular accident) ([place]) 1990's  . CVA (cerebrovascular accident) [facility])  October 2007    CT of the head atrophy with multiple remote insults noted but no definite acute findings  . Diabetes mellitus without complication ([place])   . Glaucoma   . Gout   . Hyperlipidemia   . Hypertension   . Personal history of DVT (deep vein thrombosis) 08/11/2010   BLE  . Stroke ([place])   . Type II diabetes mellitus ([place])    Review of Systems:  Negative unless mentioned in the HPI.  Physical Exam:  There were no vitals filed for this visit.  Physical Exam Vitals reviewed.  Constitutional:      General: She is not in acute distress.    Appearance: She is not ill-appearing, toxic-appearing or diaphoretic.     Comments: Thin appearing  HENT:     Head:     Comments: Abrasions and periorbital bruising of the face    Nose:     Comments: Abrasion at bridge of the nose Cardiovascular:     Rate  and Rhythm: Normal rate and regular rhythm.     Pulses: Normal pulses.     Heart sounds: Normal heart sounds. No murmur heard.  No friction rub. No gallop.   Pulmonary:     Effort: Pulmonary effort is normal. No respiratory distress.     Breath sounds: Normal breath sounds. No wheezing or rales.  Abdominal:     General: Abdomen is flat. There is no distension.     Palpations: Abdomen is soft.     Tenderness: There is no abdominal tenderness. There is no guarding.  Musculoskeletal:     Right lower leg: Edema (2+ to the mid shin) present.     Left lower leg: No edema (2+ to the mid shin).  Skin:    General: Skin is warm.  Psychiatric:        Mood and Affect: Mood normal.   Mental Status: Patient is awake, alert, oriented x2 to person and place but not time No signs of aphasia Cranial Nerves: II: Pupils equal, round, and reactive to light.   III,IV, VI: EOMI without ptosis or diploplia.  V: Facial sensation is symmetric to light touch and  Temperature. VII: Facial movement is symmetric.  VIII: hearing is intact to voice X: Uvula elevates symmetrically XI: Shoulder shrug is symmetric. XII: tongue is midline without atrophy  or fasciculations.  Motor: 5/5 in both upper and lower extremities  Sensory: Sensation is grossly intact in bilateral UEs & LEs Deep Tendon Reflexes: 2+ in UE & LE  Assessment & Plan:   See Encounters Tab for problem based charting.  Patient discussed with Dr. Philipp Ovens

## 2020-01-03 NOTE — Progress Notes (Addendum)
ANTICOAGULATION CONSULT NOTE - Initial Consult  Pharmacy Consult for Warfarin  Indication: stroke, history of DVT  Allergies  Allergen Reactions  . Diltiazem Other (See Comments)    Symptomatic bradaycardia  . Diltiazem     unknown  . Metoprolol Other (See Comments)    Symptomatic bradycardia  . Metoprolol     unknown    Patient Measurements: Weight: 58.1 kg Height: 65 inches  Vital Signs: Temp: 98.4 F (36.9 C) (06/10 1810) Temp Source: Oral (06/10 1810) BP: 165/68 (06/10 1810) Pulse Rate: 73 (06/10 1810)  Labs: Recent Labs    01/02/20 1639 01/03/20 1626  HGB 11.4* 11.1*  HCT 35.7* 34.4*  PLT 302 291  APTT  --  28  LABPROT 18.1* 18.3*  INR 1.6* 1.6*  CREATININE 3.33* 3.50*    Estimated Creatinine Clearance: 9.6 mL/min (A) (by C-G formula based on SCr of 3.5 mg/dL (H)).   Medical History: Past Medical History:  Diagnosis Date  . Anemia     normocytic anemia with baseline hemoglobin 10-11  . Blindness of left eye     likely related to stroke, left cataract removed from that eye with complications  . Calculus gallbladder and bile duct with cholecystitis with obstruction 01/2015  . Chronic kidney disease 2006    left renal artery stenosis with probable hemodynamic significance, kidneys are normal in morphology without focal lesions or hydronephrosis this is based but cannot A. of the abdomen with and without contrast done on the 31st 2006  . CKD (chronic kidney disease), stage III   . CVA (cerebral vascular accident) ([place]) 1990's  . CVA (cerebrovascular accident) [facility])  October 2007    CT of the head atrophy with multiple remote insults noted but no definite acute findings  . Diabetes mellitus without complication ([place])   . Glaucoma   . Gout   . Hyperlipidemia   . Hypertension   . Personal history of DVT (deep vein thrombosis) 08/11/2010   BLE  . Stroke ([place])   . Type II diabetes mellitus [facility])     Assessment: 86 yr old female with medical hx of  dementia, prior CVA, T2DM, HTN, chronic anemia, and DJD presented to Internal Medicine Clinic today for follow up after recent ED visit following a fall. At ED visit, CT scan showed new acute-subacute infarct in occipital lobe (compared to CT scan a few days prior) and no acute intracranial hemorrhage. Pt takes warfarin 5 mg PO daily at home for hx of DVT (she is followed at [facility] clinic; last visit was on 12/31/19).   H/H 11.1/34.4 (Hgb trending down), platelets 291; INR 1.6 on admission (unclear when pt took last dose of warfarin PTA)  MRI done this evening was positive for acute and confluence R PCA territory infarct, superimposed on chronic R MCA and PCA encephalomalacia; no acute hemorrhage or mass effect.  Goal of Therapy:  INR 2-3 Monitor platelets by anticoagulation protocol: Yes   Plan:  Warfarin 7.5 mg PO X 1 this evening Monitor daily INR, CBC Monitor for signs/symptoms of bleeding  Gillermina Hu, PharmD, BCPS, [facility] Clinical Pharmacist 01/03/2020,6:17 PM

## 2020-01-03 NOTE — Progress Notes (Signed)
Report called to [facility] on [address].  Patient remains alert and able to verbalize. Transported to [address] [place] [address] via wheelchair.  Sander Nephew, RN 01/03/2020 4:522 PM.

## 2020-01-03 NOTE — Telephone Encounter (Addendum)
Called patient's daughter in law to follow up on Chelsea Mcdaniel's recent ED visits. CT head from 01/02/20 showed an acute right occipital cortical infarct. The patient should return to clinic for a full neurological workup, evaluated for any deficits, possible need for admission. The patient is on coumadin and at risk for hemorrhagic conversion. The patient's daughter in law will bring to imc today.  Of note, patient's lasix dose was recently increased to 80mg  bid  At 12/25/19 visit due to her increased lower extremity edema. Volume status will also be re-evaluated at this  visit.   Chelsea Mage, MD Internal Medicine PGY3 Pager:[phone] 01/03/2020, 2:09 PM

## 2020-01-03 NOTE — Progress Notes (Signed)
Pt admitted to the unit as a direct admit from teaching services. Pt alert and verbally responsive, pt arrived to the unit via wheelchair with belongings and family to the side. Pt skin assessment completed with skin RN per protocol and no pressure ulcers noted. Sacral foam dsg applied for protection. VSS, telemetry applied and verified with CCMD, second RN called to second verify. IV established to left forearm. Small ecchymosis and scabs noted to the face but pt report is the results from her falls at home. Pt placed on low bed and call light within reach. Will continue to closely monitor. Delia Heady RN   01/03/20 1810  Vitals  Temp 98.4 F (36.9 C)  Temp Source Oral  BP (!) 165/68  MAP (mmHg) 95  BP Location Right Arm  BP Method Automatic  Patient Position (if appropriate) Lying  Pulse Rate 73  Pulse Rate Source Dinamap  New onset of dysrhythmia? No  Resp 16  Level of Consciousness  Level of Consciousness Alert  Oxygen Therapy  SpO2 100 %  O2 Device Room Air  Pain Assessment  Pain Scale 0-10  Pain Score 0  MEWS Score  MEWS Temp 0  MEWS Systolic 0  MEWS Pulse 0  MEWS RR 0  MEWS LOC 0  MEWS Score 0  MEWS Score Color Green

## 2020-01-03 NOTE — H&P (Signed)
Date: 01/03/2020               Patient Name:  Chelsea Mcdaniel MRN: [id number]  DOB: Oct 16, 1927 Age / Sex: 86 y.o., female   PCP: [name], MD         Medical Service: Internal Medicine Teaching Service         Attending Physician: Dr. Bartholomew Crews, MD    First Contact: Dr. Harvie Heck Pager: [phone]  Second Contact: Dr. Gilberto Better Pager: [phone]       After Hours (After 5p/  First Contact Pager: [phone]  weekends / holidays): Second Contact Pager: [phone]   Chief Complaint: Stroke  History of Present Illness: Chelsea Mcdaniel is a 86 yo F w/ PMH of dementia, prior CVA, T2DM, HTN, Chronic anemia, and DJD presenting to [facility] after fall. She was found to have acute CVA on recent imaging in ED and was consulted to hospital team for direct admission. She was examined and evaluated at bedside. She mentions 'having too many children' and not wanting to have any more. She is AAOx2 to name and location but she is unable to describe her reason for admission. She is able to recognize her daughter-in-law. She denies any current pain except for some headaches. She endorses some light-headedness and recent loss of appetite.   Additional history was obtained via family member, Chelsea Mcdaniel, at bedside. She was getting ready to leave be placed in the car and her wheelchair was not locked, as it was new and her daughter-in-law was unfamiliar with it. When she turned around after grabbing her mask, Chelsea Mcdaniel was found down after an unwitnessed fall. She had facial trauma with breaking of her glasses but denies any seizure-like activity or loss of consciousness. Chelsea Mcdaniel describes recent history of worsening weakness since last week and mentions that they also had a prior ED visit on 12/31/19 after she fell while using her walker as she was getting prepared to leave the house.  On ROS, denies any fevers, chills, cough, nausea, vomiting, chest pain, blurry vision, or diarrhea.  Per chart  review, she had a CT head performed during recent ED visit which revealed an acute right occipital cortical infarct on CT. At the time, she was discharged home with recommendation to f/u with PCP for risk/benefit discussion regarding Coumadin.  Meds:  Allopurinol 100mg  daily Amlodipine 5mg  daily Calcitriol 0.25 mcg daily Clonidine 1 patch daily Dorzolamide-timolol drip daily Furosemide 80mg  BID Gavilax 17gm daily Hydralazine 75mg  TID Januvia 25mg  daily Latanoprost 1 drop qhs Warfarin 5mg  daily  Allergies: Allergies as of 01/03/2020 - Review Complete 01/02/2020  Allergen Reaction Noted  . Diltiazem Other (See Comments) 02/06/2016  . Diltiazem  01/02/2020  . Metoprolol Other (See Comments) 02/06/2016  . Metoprolol  01/02/2020   Past Medical History:  Diagnosis Date  . Anemia     normocytic anemia with baseline hemoglobin 10-11  . Blindness of left eye     likely related to stroke, left cataract removed from that eye with complications  . Calculus gallbladder and bile duct with cholecystitis with obstruction 01/2015  . Chronic kidney disease 2006    left renal artery stenosis with probable hemodynamic significance, kidneys are normal in morphology without focal lesions or hydronephrosis this is based but cannot A. of the abdomen with and without contrast done on the 31st 2006  . CKD (chronic kidney disease), stage III   . CVA (cerebral vascular accident) ([place]) 1990's  . CVA (cerebrovascular accident) ([place])  October 2007    CT of the head atrophy with multiple remote insults noted but no definite acute findings  . Diabetes mellitus without complication ([place])   . Glaucoma   . Gout   . Hyperlipidemia   . Hypertension   . Personal history of DVT (deep vein thrombosis) 08/11/2010   BLE  . Stroke ([place])   . Type II diabetes mellitus ([facility])    Family History:  Unable to provide additional history  Family History  Problem Relation Age of Onset  . Heart disease Mother   . Diabetes  Mother   . Hyperlipidemia Mother   . Hypertension Mother   . Heart disease Father   . Diabetes Father   . Hyperlipidemia Father   . Hypertension Father    Social History:  Lives at home with children, including her daughter-in-law  Social History   Tobacco Use  . Smoking status: Never Smoker  . Smokeless tobacco: Never Used  Vaping Use  . Vaping Use: Never used  Substance Use Topics  . Alcohol use: Not Currently  . Drug use: Never   Review of Systems: A complete ROS was negative except as per HPI.  Physical Exam:  Today's Vitals   01/03/20 1810  BP: (!) 165/68  Pulse: 73  Resp: 16  Temp: 98.4 F (36.9 C)  TempSrc: Oral  SpO2: 100%  PainSc: 0-No pain   Gen: Well-developed, well nourished, NAD HEENT: NCAT head, hearing intact, EOMI, poor dentition, MMM Neck: supple, ROM intact CV: RRR, S1, S2 normal, grade 3 systolic murmur Pulm: CTAB, No rales, no wheezes Abd: Soft, BS+, NTND, No rebound, no guarding Extm: Stable chronic RLE > LLE. Trace ankle pitting edema Skin: Dry, Warm, desquamatized lesion on bridge of the nose with surrounding ecchymosis Neurologic exam: Mental status: A&Ox2 Cranial Nerves:             II: PERRL             III, IV, VI: Extra-occular motions unable to be assessed             V, VII: Face symmetric, sensation intact in all 3 divisions               VIII: hearing normal to rubbing fingers bilaterally               IX, X: palate rises symmetrically             XI: Head turn and shoulder shrug normal bilaterally               XII: tongue midline    Motor: Strength 3/5 on LUE, 5/5 bilateral lower extremities, bulk muscle and tone are normal  Gait: Unable to assess Sensory: Left sided numbness Coordination: Rapid alternating movement test normal. Heel to shin test normal. Psychiatric: Normal mood and affect  EKG: N/A  CXR: N/A  Assessment & Plan by Problem: Active Problems:   CVA (cerebral vascular accident) [facility])  Chelsea Mcdaniel is a  86 yo F w/ PMH of dementia, prior CVA, T2DM, HTN, Chronic anemia, and DJD admit for acute right occipital infarct  Left sided weakness 2/2 acute vs chronic right infarct CT head on 01/02/20 at recent ED visit with acute right occipital cortical infarct. Unclear date of onset due to dementia but possibly yesterday afternoon vs on 12/31/19 when she fell. Previous hx of ischemic infarcts on Coumadin but not on anti-platelet therapy. Left sided numbness and weakness on exam. Unclear if neurologic deficits are  new considering hx of prior CVAs. - Neurology consult - Allow for permissive HTN in the setting of CVA(systolic < 887 and diastolic < 195) - ASA 974 mg / 81 mg daily  - EKG + Tele monitoring - SLP eval - PT/OT  Dementia Has hx of dementia. All IADLs and most ADLs need assistance. Lives with family. Pleasantly demented and AAOx2 - Delirium precautions  Chronic R DVT on AC w/ Coumadin INR subtherapeutic at 1.6. Stable RUE edema on exam. No evidence of intracranial bleed on recent CTs. Hemoglobin down-trending 11.9->11.4->11.1 No evidence of obvious bleed but will need to monitor closely. Possible dilutional in setting of CKD4 - Warfarin per pharmacy - Monitor for bleed  Chronic Kidney Disease Stage 4 Baseline BUN 39, Creatinine 3.5. Considered not candidate for dialysis. Recently up-titrated to furosemide 80mg  BID. Does not appear volume overloaded - Trend renal fx - Avoid nephrotoxic meds when able - Furosemide 40mg  BID - Daily weights, I&Os  T2DM On januvia at home. Hgb a1c 6.4 - SSI - Glucose checks  HTN On amlodipine 5mg . Current bp 165/68 - Holding home bp meds in setting of permissive hypertension   DVT prophx: Warfarin Diet: NPO til S&S Bowel: Miralax Code: DNR  Prior to Admission Living Arrangement: Home Anticipated Discharge Location: SNF Barriers to Discharge: Placement  Dispo: Admit patient to Inpatient with expected length of stay greater than 2  midnights.  Signed: Mosetta Anis, MD 01/03/2020, 6:12 PM  Pager: [phone]  After 5pm on weekdays and 1pm on weekends: On Call Pager: [phone]

## 2020-01-03 NOTE — Consult Note (Signed)
Requesting Physician: Dr.    Laurel Dimmer Complaint:   History obtained from: Patient and Chart    HPI:                                                                                                                                       Chelsea Mcdaniel is a 86 y.o. female with past medical history of dementia who is dependent for most ADLs, prior history of right MCA infarct with left hemiparesis, left eye blindness, hyperlipidemia, hypertension, chronic kidney disease, type 2 diabetes mellitus, PT presents to the emergency department after a fall from her wheelchair 2 days ago.  She was taken to the emergency department and CT head was performed which demonstrated a subacute infarct in the right occipital lobe.  Patient is drowsy and only states her name.  History was obtained by chart review.  It appears patient has been more confused with repetitive speech.  Patient is on Coumadin for DVT.  Patient has been admitted to medicine service for further management neurology was consulted for acute stroke stroke.    Date last known well: 2 days ago Time last known well: Unclear tPA Given: No, outside window patient on anticoagulation Baseline MRS 4   Past Medical History:  Diagnosis Date   Anemia     normocytic anemia with baseline hemoglobin 10-11   Blindness of left eye     likely related to stroke, left cataract removed from that eye with complications   [place]culus gallbladder and bile duct with cholecystitis with obstruction 01/2015   Chronic kidney disease 2006    left renal artery stenosis with probable hemodynamic significance, kidneys are normal in morphology without focal lesions or hydronephrosis this is based but cannot A. of the abdomen with and without contrast done on the 31st 2006   CKD (chronic kidney disease), stage III    CVA (cerebral vascular accident) ([place]) 1990's   CVA (cerebrovascular accident) [facility])  October 2007    CT of the head atrophy with multiple remote insults  noted but no definite acute findings   Diabetes mellitus without complication (HCC)    Glaucoma    Gout    Hyperlipidemia    Hypertension    Personal history of DVT (deep vein thrombosis) 08/11/2010   BLE   Stroke ([place])    Type II diabetes mellitus ([place])     Past Surgical History:  Procedure Laterality Date   ABDOMINAL HYSTERECTOMY     CATARACT EXTRACTION Left    TRANSTHORACIC ECHOCARDIOGRAM   May 2008    left ventricular systolic function normal EF estimated range of 55-60%, no definite diagnostic evidence of left ventricular regional wall motion abnormalities, Doppler parameters consistent with abnormal left ventricular relaxation, findings suggestive of possible bicuspid aortic valve and aortic valve thickness mildly increase    Family History  Problem Relation Age of Onset   Heart disease Mother  Diabetes Mother    Hyperlipidemia Mother    Hypertension Mother    Heart disease Father    Diabetes Father    Hyperlipidemia Father    Hypertension Father    Social History:  reports that she has never smoked. She has never used smokeless tobacco. She reports previous alcohol use. She reports that she does not use drugs.  Allergies:  Allergies  Allergen Reactions   Diltiazem Other (See Comments)    Symptomatic bradaycardia   Diltiazem     unknown   Metoprolol Other (See Comments)    Symptomatic bradycardia   Metoprolol     unknown    Medications:                                                                                                                        I reviewed home medications   ROS:                                                                                                                                     Unable to obtain due to patient's mental status   Examination:                                                                                                      General: Appears well-developed  Psych: Affect  appropriate to situation Eyes: No scleral injection HENT: No OP obstrucion Head: Normocephalic.  Cardiovascular: Normal rate and regular rhythm. Respiratory: Effort normal and breath sounds normal to anterior ascultation GI: Soft.  No distension. There is no tenderness.  Skin: WDI    Neurological Examination Mental Status: This drowsy but arousable.  Briefly stated her name but did not follow any commands or answer any questions Cranial Nerves: II: Visual fields: Unable to assess due to mental status III,IV, VI: ptosis not present, extra-ocular motions intact bilaterally, pupils equal, round, reactive to light and accommodation V,VII: Left facial droop VIII: hearing normal bilaterally IX,X: uvula rises symmetrically XI:  bilateral shoulder shrug XII: midline tongue extension Motor: Right : Upper extremity   4/5    Left:     Upper extremity   1/5  Lower extremity   4/5     Lower extremity   1/5 Tone and bulk:normal tone throughout; no atrophy noted Sensory: Withdraws to noxious symptoms bilaterally Plantars: Right: downgoing   Left: downgoing Cerebellar: Unable to assess due to mental status Gait: normal gait and station     Lab Results: Basic Metabolic Panel: Recent Labs  Lab 12/31/19 1630 12/31/19 1713 01/02/20 1639 01/03/20 1626  NA 138 140 138 137  K 3.3* 3.3* 3.3* 3.4*  CL 96* 99 101 102  CO2 24  --  23 23  GLUCOSE 165* 160* 190* 164*  BUN 40* 39* 38* 39*  CREATININE 3.35* 3.40* 3.33* 3.50*  CALCIUM 9.5  --  9.2 9.1    CBC: Recent Labs  Lab 12/31/19 1630 12/31/19 1713 01/02/20 1639 01/03/20 1626  WBC 6.3  --  6.4 6.9  NEUTROABS  --   --   --  4.4  HGB 12.2 11.9* 11.4* 11.1*  HCT 38.2 35.0* 35.7* 34.4*  MCV 96.0  --  96.5 96.4  PLT 325  --  302 291    Coagulation Studies: Recent Labs    01/02/20 1639 01/03/20 1626  LABPROT 18.1* 18.3*  INR 1.6* 1.6*    Imaging: CT Head Wo Contrast  Result Date: 01/02/2020 CLINICAL DATA:  [place], facial  trauma EXAM: CT HEAD WITHOUT CONTRAST TECHNIQUE: Contiguous axial images were obtained from the base of the skull through the vertex without intravenous contrast. COMPARISON:  12/31/2019 FINDINGS: Brain: Since the prior exam 12/31/2019, and inferior right occipital infarct has developed. Extensive chronic small vessel ischemic changes are again seen throughout the periventricular and subcortical white matter. No acute hemorrhage. Lateral ventricles and midline structures are unremarkable. No acute extra-axial fluid collections. No mass effect. Vascular: No hyperdense vessel or unexpected calcification. Skull: Normal. Negative for fracture or focal lesion. Sinuses/Orbits: No acute finding. Other: None. IMPRESSION: 1. Acute to subacute right occipital cortical infarct, new since prior head CT 12/31/2019. 2. No acute intracranial hemorrhage. Electronically Signed   By: Randa Ngo M.D.   On: 01/02/2020 18:14   CT Cervical Spine Wo Contrast  Result Date: 01/02/2020 CLINICAL DATA:  [place] from wheelchair, facial and neck trauma EXAM: CT CERVICAL SPINE WITHOUT CONTRAST TECHNIQUE: Multidetector CT imaging of the cervical spine was performed without intravenous contrast. Multiplanar CT image reconstructions were also generated. COMPARISON:  12/31/2019 FINDINGS: Alignment: Stable kyphosis of the cervical spine. Otherwise alignment is anatomic. Skull base and vertebrae: No acute displaced fracture. Soft tissues and spinal canal: Extensive atherosclerosis of all visualized arterial structures. Prevertebral soft tissues are normal. No visible canal hematoma. Disc levels: Extensive multilevel cervical spondylosis and facet hypertrophy seen previously is unchanged. Marked hypertrophic changes at the C1/C2 interface. Upper chest: Airway is patent.  Lung apices are clear. Other: Reconstructed images demonstrate no additional findings. IMPRESSION: 1. Stable extensive cervical degenerative changes. No acute cervical spine  fracture. Electronically Signed   By: Randa Ngo M.D.   On: 01/02/2020 18:16   MR BRAIN WO CONTRAST  Result Date: 01/03/2020 CLINICAL DATA:  86 year old female with recent fall on anticoagulation. Neurologic deficit, does not follow commands. EXAM: MRI HEAD WITHOUT CONTRAST TECHNIQUE: Multiplanar, multiecho pulse sequences of the brain and surrounding structures were obtained without intravenous contrast. COMPARISON:  Head face and cervical spine CT 12/31/2019. FINDINGS: Brain: Confluent restricted diffusion  and cytotoxic edema throughout much of the right PCA territory including involvement of the right hippocampal complex, mild involvement of the right thalamus (series 5, image 74). Adjacent chronic encephalomalacia in the right lateral occipital lobe, also the right superior frontal and parietal lobes. Chronic hemosiderin in those areas, but no acute hemorrhage is evident. No significant intracranial mass effect. No midline shift. Patchy chronic encephalomalacia also in the posterior left temporal lobe. Superimposed confluent bilateral cerebral white matter T2 and FLAIR hyperintensity, with chronic lacunar infarcts in the left deep gray nuclei. No other restricted diffusion. No evidence of mass lesion, ventriculomegaly, extra-axial collection. Pituitary within normal limits. Vascular: Major intracranial vascular flow voids are preserved. Skull and upper cervical spine: Mild to moderate for age upper cervical spine degeneration with evidence of mild spinal stenosis at the cervicomedullary junction and C3-C4. Sinuses/Orbits: Chronic postoperative changes to the left globe. Paranasal sinuses and mastoids are stable and well pneumatized. Other: Negative scalp and face soft tissues. IMPRESSION: 1. Positive for acute and confluent Right PCA territory infarct, superimposed on Chronic right MCA and PCA encephalomalacia. No acute hemorrhage. No significant mass effect. 2. Mild chronic blood products. Chronic left  MCA infarct, and fairly extensive chronic small vessel disease. 3. Skull base and cervical spine degeneration. Electronically Signed   By: Genevie Ann M.D.   On: 01/03/2020 22:09   DG Pelvis Portable  Result Date: 01/02/2020 CLINICAL DATA:  [place] out of wheelchair EXAM: PORTABLE PELVIS 1-2 VIEWS COMPARISON:  12/31/2019 FINDINGS: Single frontal view of the pelvis demonstrates no acute displaced fractures. The hips are well aligned. Stable symmetrical hip osteoarthritis. Extensive vascular calcifications again noted. IMPRESSION: 1. Stable exam, no acute fracture. Electronically Signed   By: Randa Ngo M.D.   On: 01/02/2020 16:36   DG Chest Portable 1 View  Result Date: 01/02/2020 CLINICAL DATA:  Trauma, fell out of wheelchair EXAM: PORTABLE CHEST 1 VIEW COMPARISON:  02/05/2016 FINDINGS: Single frontal view of the chest demonstrates a stable cardiac silhouette. Diffuse interstitial scarring is unchanged, without airspace disease, effusion, or pneumothorax. No acute displaced fracture. Severe bilateral shoulder osteoarthritis. IMPRESSION: 1. No acute intrathoracic process. Electronically Signed   By: Randa Ngo M.D.   On: 01/02/2020 16:35     ASSESSMENT AND PLAN  86 y.o. female with past medical history of dementia who is dependent for most ADLs, prior history of right MCA infarct with left hemiparesis, left eye blindness, hyperlipidemia, hypertension, chronic kidney disease, type 2 diabetes mellitus, PT presents to the emergency department after a fall from her wheelchair 2 days ago.  Patient likely had an acute occlusion of the right PCA-most likely cardioembolic in etiology.  High suspicion for paroxysmal A. fib.  Patient subtherapeutic on Coumadin. Given patient's age, consider discussing with family goals of care and palliative care consult   Subacute right occipital lobe stroke Dementia Delirium and metabolic encephalopathy  Etiology: Suspect cardioembolic source   Recommend #Palliative  care consult #Transthoracic Echo and Holter monitor family wants aggressive treatment: High suspicion for paroxysmal atrial fibrillation #Carotid Dopplers #Continue Coumadin #Start or continue Atorvastatin 40 mg/other high intensity statin # BP goal: 1 56-4 60 systolic # HBAIC and Lipid profile # Telemetry monitoring # Frequent neuro checks #  stroke swallow screen  Please page stroke NP  Or  PA  Or MD from 8am -4 pm  as this patient from this time will be  followed by the stroke.   You can look them up on [email or web address]  Password [email or web address]  Jamin Humphries [facility] Pager Number [phone]

## 2020-01-04 ENCOUNTER — Inpatient Hospital Stay (HOSPITAL_COMMUNITY): Payer: Medicare Other

## 2020-01-04 DIAGNOSIS — I342 Nonrheumatic mitral (valve) stenosis: Secondary | ICD-10-CM

## 2020-01-04 DIAGNOSIS — I63431 Cerebral infarction due to embolism of right posterior cerebral artery: Secondary | ICD-10-CM

## 2020-01-04 DIAGNOSIS — I639 Cerebral infarction, unspecified: Secondary | ICD-10-CM

## 2020-01-04 LAB — GLUCOSE, CAPILLARY
Glucose-Capillary: 141 mg/dL — ABNORMAL HIGH (ref 70–99)
Glucose-Capillary: 150 mg/dL — ABNORMAL HIGH (ref 70–99)
Glucose-Capillary: 158 mg/dL — ABNORMAL HIGH (ref 70–99)
Glucose-Capillary: 107 mg/dL — ABNORMAL HIGH (ref 70–99)

## 2020-01-04 LAB — CBC
HCT: 32.5 % — ABNORMAL LOW (ref 36.0–46.0)
Hemoglobin: 10.5 g/dL — ABNORMAL LOW (ref 12.0–15.0)
MCH: 30.8 pg (ref 26.0–34.0)
MCHC: 32.3 g/dL (ref 30.0–36.0)
MCV: 95.3 fL (ref 80.0–100.0)
Platelets: 269 10*3/uL (ref 150–400)
RBC: 3.41 MIL/uL — ABNORMAL LOW (ref 3.87–5.11)
RDW: 14.8 % (ref 11.5–15.5)
WBC: 5.9 10*3/uL (ref 4.0–10.5)
nRBC: 0 % (ref 0.0–0.2)

## 2020-01-04 LAB — BASIC METABOLIC PANEL
Anion gap: 11 (ref 5–15)
BUN: 37 mg/dL — ABNORMAL HIGH (ref 8–23)
CO2: 24 mmol/L (ref 22–32)
Calcium: 8.7 mg/dL — ABNORMAL LOW (ref 8.9–10.3)
Chloride: 101 mmol/L (ref 98–111)
Creatinine, Ser: 3.42 mg/dL — ABNORMAL HIGH (ref 0.44–1.00)
GFR calc Af Amer: 13 mL/min — ABNORMAL LOW (ref >60)
GFR calc non Af Amer: 11 mL/min — ABNORMAL LOW (ref >60)
Glucose, Bld: 141 mg/dL — ABNORMAL HIGH (ref 70–99)
Potassium: 3.7 mmol/L (ref 3.5–5.1)
Sodium: 136 mmol/L (ref 135–145)

## 2020-01-04 LAB — ECHOCARDIOGRAM COMPLETE
Height: 65 in
Weight: 2160 oz

## 2020-01-04 LAB — PROTIME-INR
INR: 1.8 — ABNORMAL HIGH (ref 0.8–1.2)
Prothrombin Time: 19.8 seconds — ABNORMAL HIGH (ref 11.4–15.2)

## 2020-01-04 MED ORDER — AMLODIPINE BESYLATE 10 MG PO TABS: 10.0000 mg | ORAL_TABLET | Freq: Every day | ORAL | Status: DC

## 2020-01-04 MED ORDER — HYDRALAZINE HCL 20 MG/ML IJ SOLN
5.0000 mg | Freq: Once | INTRAMUSCULAR | Status: AC
  Administered 2020-01-04: 5 mg via INTRAVENOUS
  Filled 2020-01-04: qty 1

## 2020-01-04 MED ORDER — HYDRALAZINE HCL 50 MG PO TABS
75.0000 mg | ORAL_TABLET | Freq: Three times a day (TID) | ORAL | Status: DC
  Filled 2020-01-04: qty 1

## 2020-01-04 MED ORDER — WARFARIN SODIUM 5 MG PO TABS
5.0000 mg | ORAL_TABLET | Freq: Once | ORAL | Status: AC
  Administered 2020-01-04: 5 mg via ORAL
  Filled 2020-01-04: qty 1

## 2020-01-04 MED ORDER — FUROSEMIDE 10 MG/ML IJ SOLN
20.0000 mg | Freq: Two times a day (BID) | INTRAMUSCULAR | Status: DC
  Administered 2020-01-04 – 2020-01-05 (×2): 20 mg via INTRAVENOUS
  Filled 2020-01-04 (×2): qty 4

## 2020-01-04 MED ORDER — POTASSIUM CHLORIDE 10 MEQ/100ML IV SOLN
10.0000 meq | INTRAVENOUS | Status: AC
  Administered 2020-01-04 (×2): 10 meq via INTRAVENOUS
  Filled 2020-01-04 (×2): qty 100

## 2020-01-04 MED ORDER — AMLODIPINE BESYLATE 5 MG PO TABS
5.0000 mg | ORAL_TABLET | Freq: Every day | ORAL | Status: DC
  Administered 2020-01-05: 5 mg via ORAL
  Filled 2020-01-04: qty 1

## 2020-01-04 MED ORDER — AMLODIPINE BESYLATE 5 MG PO TABS
5.0000 mg | ORAL_TABLET | Freq: Every day | ORAL | Status: DC
  Administered 2020-01-04: 5 mg via ORAL
  Filled 2020-01-04: qty 2

## 2020-01-04 MED ORDER — HYDRALAZINE HCL 20 MG/ML IJ SOLN
5.0000 mg | Freq: Three times a day (TID) | INTRAMUSCULAR | Status: DC
  Administered 2020-01-05 (×3): 5 mg via INTRAVENOUS
  Filled 2020-01-04 (×3): qty 1

## 2020-01-04 MED ORDER — FUROSEMIDE 40 MG PO TABS: 40.0000 mg | ORAL_TABLET | Freq: Two times a day (BID) | ORAL | Status: DC

## 2020-01-04 MED ORDER — HYDRALAZINE HCL 50 MG PO TABS: 75.0000 mg | ORAL_TABLET | Freq: Three times a day (TID) | ORAL | Status: DC

## 2020-01-04 MED ORDER — ATORVASTATIN CALCIUM 40 MG PO TABS
40.0000 mg | ORAL_TABLET | Freq: Every day | ORAL | Status: DC
  Administered 2020-01-05 – 2020-01-06 (×2): 40 mg via ORAL
  Filled 2020-01-04 (×4): qty 1

## 2020-01-04 NOTE — Progress Notes (Signed)
STROKE TEAM PROGRESS NOTE   INTERVAL HISTORY Patient presented with a fall from a wheelchair and has prior history of strokes and I personally reviewed history of presenting illness, electronic medical records and imaging films in PACS.  MRI scan of the brain shows acute right PCA infarct with old chronic right MCA and PCA infarcts.  LDL cholesterol is elevated at 159 mg percent.  Hemoglobin A1c 6.4.  INR is suboptimal at 1.8. Vitals:   01/03/20 2023 01/03/20 2324 01/04/20 0332 01/04/20 0810  BP: (!) 168/74 (!) 160/73 (!) 169/65 (!) 191/76  Pulse: 78 68 65 75  Resp: 18 17 18 17   Temp: 98.5 F (36.9 C) 98.7 F (37.1 C) 98.4 F (36.9 C) 98.5 F (36.9 C)  TempSrc: Oral Oral Oral Oral  SpO2: 98% 99% 96% 100%  Weight:        CBC:  Recent Labs  Lab 01/03/20 1626 01/04/20 0718  WBC 6.9 5.9  NEUTROABS 4.4  --   HGB 11.1* 10.5*  HCT 34.4* 32.5*  MCV 96.4 95.3  PLT 291 161    Basic Metabolic Panel:  Recent Labs  Lab 01/03/20 1626 01/04/20 0718  NA 137 136  K 3.4* 3.7  CL 102 101  CO2 23 24  GLUCOSE 164* 141*  BUN 39* 37*  CREATININE 3.50* 3.42*  CALCIUM 9.1 8.7*   Lipid Panel:     Component Value Date/Time   CHOL 235 (H) 01/03/2020 1626   TRIG 145 01/03/2020 1626   HDL 47 01/03/2020 1626   CHOLHDL 5.0 01/03/2020 1626   VLDL 29 01/03/2020 1626   LDLCALC 159 (H) 01/03/2020 1626   HgbA1c:  Lab Results  Component Value Date   HGBA1C 6.4 (H) 01/03/2020   Urine Drug Screen: No results found for: LABOPIA, COCAINSCRNUR, LABBENZ, AMPHETMU, THCU, LABBARB  Alcohol Level     Component Value Date/Time   ETH <10 12/31/2019 1630    IMAGING past 24 hours MR BRAIN WO CONTRAST  Result Date: 01/03/2020 CLINICAL DATA:  86 year old female with recent fall on anticoagulation. Neurologic deficit, does not follow commands. EXAM: MRI HEAD WITHOUT CONTRAST TECHNIQUE: Multiplanar, multiecho pulse sequences of the brain and surrounding structures were obtained without intravenous  contrast. COMPARISON:  Head face and cervical spine CT 12/31/2019. FINDINGS: Brain: Confluent restricted diffusion and cytotoxic edema throughout much of the right PCA territory including involvement of the right hippocampal complex, mild involvement of the right thalamus (series 5, image 74). Adjacent chronic encephalomalacia in the right lateral occipital lobe, also the right superior frontal and parietal lobes. Chronic hemosiderin in those areas, but no acute hemorrhage is evident. No significant intracranial mass effect. No midline shift. Patchy chronic encephalomalacia also in the posterior left temporal lobe. Superimposed confluent bilateral cerebral white matter T2 and FLAIR hyperintensity, with chronic lacunar infarcts in the left deep gray nuclei. No other restricted diffusion. No evidence of mass lesion, ventriculomegaly, extra-axial collection. Pituitary within normal limits. Vascular: Major intracranial vascular flow voids are preserved. Skull and upper cervical spine: Mild to moderate for age upper cervical spine degeneration with evidence of mild spinal stenosis at the cervicomedullary junction and C3-C4. Sinuses/Orbits: Chronic postoperative changes to the left globe. Paranasal sinuses and mastoids are stable and well pneumatized. Other: Negative scalp and face soft tissues. IMPRESSION: 1. Positive for acute and confluent Right PCA territory infarct, superimposed on Chronic right MCA and PCA encephalomalacia. No acute hemorrhage. No significant mass effect. 2. Mild chronic blood products. Chronic left MCA infarct, and fairly extensive chronic  small vessel disease. 3. Skull base and cervical spine degeneration. Electronically Signed   By: Genevie Ann M.D.   On: 01/03/2020 22:09   ECHOCARDIOGRAM COMPLETE  Result Date: 01/04/2020    ECHOCARDIOGRAM REPORT   Patient Name:   KYRSTYN GREEAR [facility] Date of Exam: 01/04/2020 Medical Rec #:  [id number]       Height:       65.0 in Accession #:    [id number]      Weight:        135.0 lb Date of Birth:  19-Apr-1928        BSA:          1.674 m Patient Age:    47 years        BP:           191/76 mmHg Patient Gender: F               HR:           75 bpm. Exam Location:  Inpatient Procedure: 2D Echo, Cardiac Doppler and Color Doppler Indications:    Murmur  History:        Patient has prior history of Echocardiogram examinations, most                 recent 07/11/2018. Risk Factors:Diabetes, Dyslipidemia and                 Hypertension. CKD. H/o DVT and CVA.  Sonographer:    Clayton Lefort RDCS (AE) Referring Phys: [id number] [facility] A BUTCHER  Sonographer Comments: Limited ability to move and position patient. IMPRESSIONS  1. Left ventricular ejection fraction, by estimation, is 60 to 65%. The left ventricle has normal function. The left ventricle has no regional wall motion abnormalities. There is moderate left ventricular hypertrophy. Left ventricular diastolic parameters are consistent with Grade II diastolic dysfunction (pseudonormalization). Elevated left atrial pressure.  2. Right ventricular systolic function is normal. The right ventricular size is normal.  3. The mitral valve is degenerative. No evidence of mitral valve regurgitation. Mild mitral stenosis. The mean mitral valve gradient is 5.0 mmHg.  4. The aortic valve was not well visualized. Aortic valve regurgitation is not visualized. Moderate aortic valve stenosis. Aortic valve mean gradient measures 29.0 mmHg. Aortic valve Vmax measures 3.20 m/s.  5. The inferior vena cava is normal in size with <50% respiratory variability, suggesting right atrial pressure of 8 mmHg. FINDINGS  Left Ventricle: Left ventricular ejection fraction, by estimation, is 60 to 65%. The left ventricle has normal function. The left ventricle has no regional wall motion abnormalities. The left ventricular internal cavity size was normal in size. There is  moderate left ventricular hypertrophy. Left ventricular diastolic parameters are consistent with Grade  II diastolic dysfunction (pseudonormalization). Elevated left atrial pressure. Right Ventricle: The right ventricular size is normal. No increase in right ventricular wall thickness. Right ventricular systolic function is normal. Left Atrium: Left atrial size was normal in size. Right Atrium: Right atrial size was normal in size. Pericardium: There is no evidence of pericardial effusion. Mitral Valve: The mitral valve is degenerative in appearance. There is moderate thickening of the mitral valve leaflet(s). There is moderate calcification of the mitral valve leaflet(s). Normal mobility of the mitral valve leaflets. Severe mitral annular  calcification. No evidence of mitral valve regurgitation. Mild mitral valve stenosis. MV peak gradient, 10.2 mmHg. The mean mitral valve gradient is 5.0 mmHg. Tricuspid Valve: The tricuspid valve is normal in structure. Tricuspid valve regurgitation  is mild . No evidence of tricuspid stenosis. Aortic Valve: The aortic valve was not well visualized. . There is moderate thickening and moderate calcification of the aortic valve. Aortic valve regurgitation is not visualized. Moderate aortic stenosis is present. There is moderate thickening of the aortic valve. There is moderate calcification of the aortic valve. Aortic valve mean gradient measures 29.0 mmHg. Aortic valve peak gradient measures 41.0 mmHg. Aortic valve area, by VTI measures 1.41 cm. Pulmonic Valve: The pulmonic valve was normal in structure. Pulmonic valve regurgitation is not visualized. No evidence of pulmonic stenosis. Aorta: The aortic root is normal in size and structure. Venous: The inferior vena cava is normal in size with less than 50% respiratory variability, suggesting right atrial pressure of 8 mmHg. IAS/Shunts: No atrial level shunt detected by color flow Doppler.  LEFT VENTRICLE PLAX 2D LVIDd:         3.40 cm  Diastology LVIDs:         2.40 cm  LV e' lateral:   3.00 cm/s LV PW:         1.40 cm  LV E/e'  lateral: 34.7 LV IVS:        1.80 cm  LV e' medial:    3.00 cm/s LVOT diam:     1.80 cm  LV E/e' medial:  34.7 LV SV:         55 LV SV Index:   33 LVOT Area:     2.54 cm  RIGHT VENTRICLE             IVC RV Basal diam:  3.00 cm     IVC diam: 1.80 cm RV S prime:     12.20 cm/s TAPSE (M-mode): 2.2 cm LEFT ATRIUM           Index       RIGHT ATRIUM           Index LA diam:      2.30 cm 1.37 cm/m  RA Area:     12.60 cm LA Vol (A2C): 30.3 ml 18.10 ml/m RA Volume:   26.10 ml  15.59 ml/m LA Vol (A4C): 34.9 ml 20.85 ml/m  AORTIC VALVE AV Area (Vmax):    0.78 cm AV Area (Vmean):   1.40 cm AV Area (VTI):     1.41 cm AV Vmax:           320.00 cm/s AV Vmean:          118.667 cm/s AV VTI:            0.388 m AV Peak Grad:      41.0 mmHg AV Mean Grad:      29.0 mmHg LVOT Vmax:         98.20 cm/s LVOT Vmean:        65.300 cm/s LVOT VTI:          0.215 m LVOT/AV VTI ratio: 0.55  AORTA Ao Root diam: 1.80 cm MITRAL VALVE MV Area (PHT): 3.74 cm     SHUNTS MV Peak grad:  10.2 mmHg    Systemic VTI:  0.22 m MV Mean grad:  5.0 mmHg     Systemic Diam: 1.80 cm MV Vmax:       1.60 m/s MV Vmean:      100.0 cm/s MV Decel Time: 203 msec MV E velocity: 104.00 cm/s MV A velocity: 141.00 cm/s MV E/A ratio:  0.74 Candee Furbish MD Electronically signed by Candee Furbish MD Signature Date/Time: 01/04/2020/11:55:33 AM  Final     PHYSICAL EXAM Frail elderly lady not in distress. . Afebrile. Head is nontraumatic. Neck is supple without bruit.    Cardiac exam no murmur or gallop. Lungs are clear to auscultation. Distal pulses are well felt. Neurological Exam :  Drowsy but can be aroused.  Follows simple commands.  Has dysarthria.  Right gaze preference but can look to the left past midline.  Dense left homonymous hemianopsia.  Left lower facial weakness.  Tongue midline.  Motor system exam shows left hemiparesis with grade 1-2/5 strength on the left side and 4/5 strength on the right.  Sensation appears diminished on the left compared to the  right.  Tone is increased bilaterally.  Left plantar upgoing right equivocal.  Gait not tested ASSESSMENT/PLAN [name] is a 86 y.o. female with history of dementia, stroke w/ L hemiparesis, OS blind, HLD, HTN, CKD, DB presenting following a fall from her w/c 2 d prior, increased confusion.    Stroke:  New R PCA territory infarct embolic secondary to unknown source, highly suspicious for AF, already on warfarin   CT head 6/7 No acute abnormality. Small vessel disease. ASPECTS 10.     CT head 6/9 new R occipital cortical infarct since 6/7  MRI  R PCA territory on R MCA and PCA encephalomalacia. Old L MCA infarct. Extensive small vessel disease. Skull based and spine degernation  TCD normal flow B opthalmics, ICA siphons, VAs and BA.   Carotid Doppler  B ICA 1-39% stenosis, VAs antegrade   2D Echo EF 60-65%. No source of embolus   LDL 159  HgbA1c 6.4  Warfarin for VTE prophylaxis  warfarin daily prior to admission for chronic DVT, now on warfarin daily. Recommend switch to DOAC but renal function may not permit.    Therapy recommendations:  SNF  Disposition:  pending - from home, has attentive caregiver   Hypertension  Elevated 190-200s . Permissive hypertension (OK if < 220/120) but gradually normalize in 5-7 days . Long-term BP goal normotensive  Hyperlipidemia  Home meds:  No statin   Now on lipitor 40  LDL 159, goal < 70  Continue statin at discharge  Diabetes type II Controlled  HgbA1c 6.4, goal < 7.0  Other Stroke Risk Factors  Advanced age  Hx ETOH use  Hx stroke/TIA  67 - R brain w/ L hp   Carotid stenosis s/p R CEA 1993  Hx B DVT on indefinite AC    Other Active Problems  Blind OS d/t cataract, poor vision OD  CKD  Hospital day # 1 Patient presented with embolic-looking right PCA infarct with history of multiple prior strokes despite being on anticoagulation with warfarin for DVT but INR was suboptimal.  Given her compromised  renal function she may not be a candidate for NOAC hence optimize INR with target goal of between 2 and 3.  Therapy consults.  Mobilize out of bed.  Aggressive risk factor modification.  Long discussion with patient and caregiver at the bedside and answered questions.  Greater than 50% time during this 25-minute visit was spent in counseling and coordination of care and discussion with care team and answering questions.  Stroke team will sign off.  Kindly call for questions. Antony Contras, MD To contact Stroke Continuity provider, please refer to [email or web address] After hours, contact General Neurology

## 2020-01-04 NOTE — Evaluation (Signed)
Occupational Therapy Evaluation Patient Details Name: Chelsea Mcdaniel MRN: [id number] DOB: 06-Sep-1927 Today's Date: 01/04/2020    History of Present Illness Pt is a 86 y.o. female with PMH of HTN, DM type II, carpal tunnel syndrome, OA of hands, dementia, CKD, hyperlipidemia, gout, DVT, R MCA infarct with L hemiparesis, L eye blindness. Presents to the emergency department after a fall from her wheelchair 2 days ago. She was taken to the emergency department and CT head was performed which demonstrated a subacute infarct in the right occipital lobe.   Clinical Impression   Patient admitted for above and limited by problem list below, including impaired vision and R gaze preference, impaired balance, decreased activity tolerance, generalized weakness, and impaired cognition (noted blind L eye and dementia at baseline). Patient unable to provide PLOF, but per chart review requires assist for ADls from family and fell from her w/c.  Requires max-total assist for all ADLs, mod assist +2 for transfers.  She will benefit from continued OT services while admitted and after dc at [facility] level, if patient has 24/7 assist at dc otherwise will need to look into SNF.     Follow Up Recommendations  Home health OT;Supervision/Assistance - 24 hour (if doesn't have 24/7 A will need SNF )    Equipment Recommendations  3 in 1 bedside commode    Recommendations for Other Services Other (comment) (palliative care)     Precautions / Restrictions Precautions Precautions: Fall Precaution Comments: blind L eye  Restrictions Weight Bearing Restrictions: No      Mobility Bed Mobility Overal bed mobility: Needs Assistance Bed Mobility: Supine to Sit;Sit to Supine     Supine to sit: Max assist;+2 for physical assistance Sit to supine: Max assist;+2 for physical assistance   General bed mobility comments: decreased initiation of movement, assist for trunk, LEs, seqeucning  Transfers Overall transfer level:  Needs assistance Equipment used: 2 person hand held assist Transfers: Sit to/from [facility] Sit to Stand: Mod assist;+2 physical assistance Stand pivot transfers: Mod assist;+2 physical assistance       General transfer comment: assist to power up and steady, cueing for hand placement and sequencing     Balance Overall balance assessment: Needs assistance Sitting-balance support: Feet supported Sitting balance-Leahy Scale: Fair Sitting balance - Comments: Able to sit on edge of bed once positioned there with supervision   Standing balance support: Bilateral upper extremity supported;During functional activity Standing balance-Leahy Scale: Poor Standing balance comment: reliant UE and external  assistance for standing balance                           ADL either performed or assessed with clinical judgement   ADL Overall ADL's : Needs assistance/impaired     Grooming: Total assistance Grooming Details (indicate cue type and reason): attempted washing face, Hand over hand support required to bring washcloth to face to wipe eyes using R hand                  Toilet Transfer: Moderate assistance;+2 for physical assistance;+2 for safety/equipment;Stand-pivot Toilet Transfer Details (indicate cue type and reason): simulated to recliner          Functional mobility during ADLs: Moderate assistance;+2 for physical assistance;+2 for safety/equipment General ADL Comments: max assist-total assist for all self care at this time      Vision Baseline Vision/History: Legally blind (L eye ) Patient Visual Report: No change from baseline Vision Assessment?:  Vision impaired- to be further tested in functional context Additional Comments: R gaze preference, able to locate number of fingers in R quadrants but not L      Perception     Praxis      Pertinent Vitals/Pain Pain Assessment: Faces Faces Pain Scale: Hurts a little bit Pain Location: left UE  at IV site Pain Descriptors / Indicators: Burning Pain Intervention(s): Limited activity within patient's tolerance;Monitored during session;Repositioned     Hand Dominance     Extremity/Trunk Assessment Upper Extremity Assessment Upper Extremity Assessment: Generalized weakness;LUE deficits/detail (limited shoulder flexion Bil ) LUE Deficits / Details: hx of CVA with L UE deficits, limited functional use preference to elbow flexion and supination with inability to grasp walker  LUE Coordination: decreased fine motor;decreased gross motor   Lower Extremity Assessment Lower Extremity Assessment: Defer to PT evaluation   Cervical / Trunk Assessment Cervical / Trunk Assessment: Kyphotic   Communication Communication Communication: No difficulties   Cognition Arousal/Alertness: Awake/alert Behavior During Therapy: WFL for tasks assessed/performed Overall Cognitive Status: No family/caregiver present to determine baseline cognitive functioning                                 General Comments: hx of dementia, poor historian but pleasantly confused; oriented to self    General Comments  BP supine 183/75 (108) HR 73; Seated 177/86 (101) HR 73    Exercises     Shoulder Instructions      Home Living Family/patient expects to be discharged to:: Private residence Living Arrangements: Alone                               Additional Comments: pt unable to report, per chart review patient lives with family       Prior Functioning/Environment Level of Independence: Needs assistance  Gait / Transfers Assistance Needed: per chart review using wc  ADL's / Homemaking Assistance Needed: per chart review requires assist for most ADLS             OT Problem List: Decreased strength;Decreased range of motion;Decreased activity tolerance;Impaired vision/perception;Impaired balance (sitting and/or standing);Decreased coordination;Decreased cognition;Decreased  safety awareness;Impaired UE functional use;Decreased knowledge of use of DME or AE;Decreased knowledge of precautions      OT Treatment/Interventions: Self-care/ADL training;Therapeutic exercise;DME and/or AE instruction;Therapeutic activities;Patient/family education;Balance training;Neuromuscular education;Visual/perceptual remediation/compensation    OT Goals(Current goals can be found in the care plan section) Acute Rehab OT Goals Patient Stated Goal: none stated OT Goal Formulation: With patient Time For Goal Achievement: 01/18/20 Potential to Achieve Goals: Good  OT Frequency: Min 2X/week   Barriers to D/C:            Co-evaluation PT/OT/SLP Co-Evaluation/Treatment: Yes Reason for Co-Treatment: For patient/therapist safety;To address functional/ADL transfers PT goals addressed during session: Mobility/safety with mobility;Balance OT goals addressed during session: ADL's and self-care      AM-PAC OT "6 Clicks" Daily Activity     Outcome Measure Help from another person eating meals?: A Lot Help from another person taking care of personal grooming?: A Lot Help from another person toileting, which includes using toliet, bedpan, or urinal?: Total Help from another person bathing (including washing, rinsing, drying)?: A Lot Help from another person to put on and taking off regular upper body clothing?: A Lot Help from another person to put on and taking off regular lower body clothing?:  Total 6 Click Score: 10   End of Session Nurse Communication: Mobility status  Activity Tolerance: Patient tolerated treatment well Patient left: in bed;with call bell/phone within reach;with bed alarm set  OT Visit Diagnosis: Other abnormalities of gait and mobility (R26.89);Muscle weakness (generalized) (M62.81);Other symptoms and signs involving cognitive function                Time: 1026-1105 OT Time Calculation (min): 39 min Charges:  OT General Charges $OT Visit: 1 Visit OT  Evaluation $OT Eval Moderate Complexity: 1 Mod  Jolaine Artist, OT Acute Rehabilitation Services Pager [phone] Office [phone]   Chelsea Mcdaniel 01/04/2020, 3:14 PM

## 2020-01-04 NOTE — Progress Notes (Signed)
   01/04/20 1620  Assess: MEWS Score  Temp 98.3 F (36.8 C)  BP (!) 203/87 (RN Notified)  Pulse Rate 88  Resp 17  Level of Consciousness Alert  SpO2 100 %  O2 Device Nasal Cannula  Assess: MEWS Score  MEWS Temp 0  MEWS Systolic 2  MEWS Pulse 0  MEWS RR 0  MEWS LOC 0  MEWS Score 2  MEWS Score Color Yellow  Assess: if the MEWS score is Yellow or Red  Were vital signs taken at a resting state? Yes  Focused Assessment Documented focused assessment  Early Detection of Sepsis Score *See Row Information* Low  MEWS guidelines implemented *See Row Information* Yes  Treat  MEWS Interventions Escalated (See documentation below)  Take Vital Signs  Increase Vital Sign Frequency  Yellow: Q 2hr X 2 then Q 4hr X 2, if remains yellow, continue Q 4hrs  Escalate  MEWS: Escalate Yellow: discuss with charge nurse/RN and consider discussing with provider and RRT  Notify: Charge Nurse/RN  Name of Charge Nurse/RN Notified Tracey, RN  Date Charge Nurse/RN Notified 01/04/20  Time Charge Nurse/RN Notified 1620  Notify: Provider  Provider Name/Title Dr. Marva Panda  Date Provider Notified 01/04/20  Time Provider Notified 1620  Notification Type Page  Notification Reason Other (Comment) (IV medication for SBP control)  Response See new orders  Date of Provider Response 01/04/20  Time of Provider Response 1620   Pt has been pocketing medications and water.  Patient pocketed and would not take po hydralazine and po warfarin.  Notified provider, Dr. Marva Panda, MD (teaching service for Dr. Larey Dresser, MD) to inform for MEWS 2 r/t SBP, and pocketing of fluid and medications. Provider to place order for IV antihypertensives.  Continue MEWS protocol and monitoring of SBP.

## 2020-01-04 NOTE — Progress Notes (Signed)
Carotid artery duplex and TCD's have been completed. Preliminary results can be found in CV Proc through chart review.   01/04/20 11:37 AM Chelsea Mcdaniel RVT

## 2020-01-04 NOTE — Progress Notes (Signed)
Subjective: HD1 Overnight, no acute events reported.  [name] was evaluated at bedside this morning. She is resting comfortably in bed. She is oriented to pleasantly disoriented. Discussed that patient admitted for stroke and will complete stroke work up. Patient expresses understanding. No acute concerns at this time.   Objective:  Vital signs in last 24 hours: Vitals:   01/03/20 1817 01/03/20 2023 01/03/20 2324 01/04/20 0332  BP:  (!) 168/74 (!) 160/73 (!) 169/65  Pulse:  78 68 65  Resp:  18 17 18   Temp:  98.5 F (36.9 C) 98.7 F (37.1 C) 98.4 F (36.9 C)  TempSrc:  Oral Oral Oral  SpO2:  98% 99% 96%  Weight: 61.2 kg      CBC Latest Ref Rng & Units 01/03/2020 01/02/2020 12/31/2019  WBC 4.0 - 10.5 K/uL 6.9 6.4 -  Hemoglobin 12.0 - 15.0 g/dL 11.1(L) 11.4(L) 11.9(L)  Hematocrit 36 - 46 % 34.4(L) 35.7(L) 35.0(L)  Platelets 150 - 400 K/uL 291 302 -   CMP Latest Ref Rng & Units 01/03/2020 01/02/2020 12/31/2019  Glucose 70 - 99 mg/dL 164(H) 190(H) 160(H)  BUN 8 - 23 mg/dL 39(H) 38(H) 39(H)  Creatinine 0.44 - 1.00 mg/dL 3.50(H) 3.33(H) 3.40(H)  Sodium 135 - 145 mmol/L 137 138 140  Potassium 3.5 - 5.1 mmol/L 3.4(L) 3.3(L) 3.3(L)  Chloride 98 - 111 mmol/L 102 101 99  CO2 22 - 32 mmol/L 23 23 -  Calcium 8.9 - 10.3 mg/dL 9.1 9.2 -  Total Protein 6.5 - 8.1 g/dL 6.8 - -  Total Bilirubin 0.3 - 1.2 mg/dL 0.7 - -  Alkaline Phos 38 - 126 U/L 75 - -  AST 15 - 41 U/L 23 - -  ALT 0 - 44 U/L 11 - -   Physical Exam  Constitutional: Appears well-developed and well-nourished. No distress.  Head: Normocephalic, healing scar on bridge of nose from recent fall Eyes: Conjunctivae are normal, EOM grossly intact, although notes double vision and not able clearly see hand  Cardiovascular: Normal rate, regular rhythm; 3/6 systolic murmur Respiratory: Effort normal and breath sounds normal. No respiratory distress. No wheezes, rales or rhonchi on exam GI: Soft. Bowel sounds are normal. No  distension. There is no tenderness.  Musculoskeletal: No edema.  Neurological: Alert, oriented to self and age; disoriented to place and situation; following commands Cranial Nerves:   II: decreased vision, double vision III, IV, VI: Extra-occular motions unable to be assessed V, VII: Face symmetric, sensation intact in all 3 divisions  VIII: Hearing grossly intact IX, X: palate rises symmetrically XII: tongue midline  Motor: Strength 2/5 on LUE, 5/5 bilateral lower extremities, bulk muscle and tone are normal  Gait:Unable to assess Sensory: Normal Skin: Not diaphoretic. No erythema.   MRI BRAIN:  IMPRESSION: 1. Positive for acute and confluent Right PCA territory infarct, superimposed on Chronic right MCA and PCA encephalomalacia. No acute hemorrhage. No significant mass effect. 2. Mild chronic blood products. Chronic left MCA infarct, and fairly extensive chronic small vessel disease. 3. Skull base and cervical spine degeneration.  ECHO: IMPRESSIONS  1. Left ventricular ejection fraction, by estimation, is 60 to 65%. The  left ventricle has normal function. The left ventricle has no regional  wall motion abnormalities. There is moderate left ventricular hypertrophy.  Left ventricular diastolic  parameters are consistent with Grade II diastolic dysfunction  (pseudonormalization). Elevated left atrial pressure.  2. Right ventricular systolic function is normal. The right ventricular  size is normal.  3.  The mitral valve is degenerative. No evidence of mitral valve  regurgitation. Mild mitral stenosis. The mean mitral valve gradient is 5.0  mmHg.  4. The aortic valve was not well visualized. Aortic valve regurgitation  is not visualized. Moderate aortic valve stenosis. Aortic valve mean  gradient measures 29.0 mmHg. Aortic valve Vmax measures 3.20 m/s.  5. The inferior vena cava is normal in size with <50%  respiratory  variability, suggesting right atrial pressure of 8 mmHg.   US CAROTID Summary:  Right Carotid: Velocities in the right ICA are consistent with a 1-39%  stenosis.  Left Carotid: Velocities in the left ICA are consistent with a 1-39%  stenosis.  Vertebrals: Bilateral vertebral arteries demonstrate antegrade flow.   TRANSCRANIAL DOPPLER US:  Summary:  Absent bitemporal windows limits evaluation. Normal mean flow velocities  in both opthalmics, carotid siphons , vertebrals and basilar arteries.   Assessment/Plan:  Active Problems:   CVA (cerebral vascular accident) [facility]) [name] is a 86 year old female with PMHx of dementia, prior CVA, type II DM, hypertension admitted for acute right PCA infarct.   Acute right occipital CVA: Patient with frequent falls noted to have acute right occipital cortical infarct on CT Head on 6/9. MRI with acute right PCA territory infarct superimposed on chronic right MCA and PCA encephalomalacia. She has prior history of right MCA infarct with left sided hemiparesis, was able to ambulate with walker up until this past week when she was having more frequent falls. Patient is on warfarin for chronic DVTs but noted to have subtherapeutic INR. Neurology consulted, suspect stroke is cardioembolic in etiology with high suspicion for A.fib. Patient noted to have left sided weakness with endorsement of double vision. She has pior history of left sided weakness, unclear if current weakness is worse than prior. She also has history of left eye blindness, but seems like her double vision is new.  Echo with normal EF but grade II diastolic dysfunction with mild mitral stenosis and moderate aortic valve stenosis. Carotid US without significant stenosis noted. Transcranial doppler also wnl.  - Neurology consulted, appreciate recommendations - Warfarin per pharmacy dosing - Continue cardiac monitoring - PT/OT eval  - Aspirin 81mg  daily - Atorvastatin  40mg  daily - Goal BP 140-160, resuming home amlodipine and hydralazine  Hypertension: Patient is on amlodipine 5mg  daily, furosemide 80mg  bid, hydralazine 75mg  tid and clonidine 0.3mg  weekly at baseline. Initially held in setting of permissive HTN.  - Goal BP 140-160 as above - Resuming amlodipine 5mg  daily - Resuming hydralazine 75mg  tid    Chronic RLE DVT on coumadin: INR subtherapeutic on presentation. RLE edema stable today. Hb stable without evidence of active bleeding at this time. - Continue warfarin per pharmacy dosing - INR and CBC monitoring   CKD4: Patient with hx of CKD4, not good candidate for dialysis due to age. Patient has history of hypervolemia in setting of her chronic renal disease. Patient on lasix 80mg  bid as outpatient. Received 1 dose of IV lasix 40mg  yesterday with ~350cc UOP. Renal function stable. Currently euvolemic on examination  - Continue to monitor renal function - Lasix 40mg  bid - Strict I&O, daily weights - Avoid nephrotoxic agents   Prior to Admission Living Arrangement: Home  Anticipated Discharge Location: SNF Barriers to Discharge: Continued medical management  Dispo: Anticipated discharge in approximately 1-2 day(s).   Harvie Heck, MD  Internal Medicine, PGY-1 01/04/2020, 7:19 AM Pager: [phone] After 5pm on weekdays and 1pm on weekends: On Call pager  [phone]  

## 2020-01-04 NOTE — NC FL2 (Signed)
[place] MEDICAID FL2 LEVEL OF CARE SCREENING TOOL     IDENTIFICATION  Patient Name: Chelsea Mcdaniel Birthdate: 03-Dec-1927 Sex: female Admission Date (Current Location): 01/03/2020  [facility] [place] Number:  Herbalist and Address:  [facility] [place], [place] [address] [place],  [address]      Provider Number: [id number]  Attending Physician Name and Address:  Bartholomew Crews, MD  Relative Name and Phone Number:       Current Level of Care: Hospital Recommended Level of Care: [place] Prior Approval Number:    Date Approved/Denied:   PASRR Number: [id number]  Discharge Plan: SNF    Current Diagnoses: Patient Active Problem List   Diagnosis Date Noted  . Occipital stroke ([place]) 01/03/2020  . Epidermoid cyst of skin of chest 09/17/2019  . Insomnia 07/10/2019  . Protein-calorie malnutrition, severe 07/14/2018  . Dementia ([place]) 07/12/2018  . Fall at home 07/11/2018  . Malaise and fatigue 05/02/2018  . Weight loss 02/28/2018  . bilateral lower extremity edema  04/18/2017  . Senile purpura ([place]) 12/18/2016  . Immunization not carried out because of parent refusal 12/18/2016  . Advance care planning 02/18/2016  . Fatigue 10/20/2015  . Unequal blood pressure in upper extremities 10/20/2015  . Choledocholithiasis 02/11/2015  . Constipation 01/01/2015  . Cervical radiculopathy 12/10/2014  . Left knee pain 08/24/2014  . Vitamin D insufficiency 08/24/2014  . Carpal tunnel syndrome 01/07/2014  . 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 Healthcare maintenance 08/15/2013  . CKD (chronic kidney disease) stage 4, GFR 15-29 ml/min (HCC) 02/16/2012  . Osteoarthritis of hand 02/16/2012  . Long-term (current) use of anticoagulants 08/11/2010  . History of DVT (deep vein thrombosis) 08/11/2010  . Type 2 diabetes mellitus ([place]) 12/12/2006  . Hyperlipidemia 12/12/2006  . Anemia of chronic disease  12/12/2006  . Hypertension associated with diabetes ([place]) 12/12/2006  . Late effect of cerebrovascular accident (CVA) 09/19/2006    Orientation RESPIRATION BLADDER Height & Weight     Self, Place  Normal Incontinent Weight: 135 lb (61.2 kg) Height:     BEHAVIORAL SYMPTOMS/MOOD NEUROLOGICAL BOWEL NUTRITION STATUS      Incontinent Diet (see DC summary)  AMBULATORY STATUS COMMUNICATION OF NEEDS Skin   Extensive Assist Verbally Normal                       Personal Care Assistance Level of Assistance  Bathing, Feeding, Dressing Bathing Assistance: Maximum assistance Feeding assistance: Maximum assistance Dressing Assistance: Maximum assistance     Functional Limitations Info             SPECIAL CARE FACTORS FREQUENCY  PT (By licensed PT), OT (By licensed OT)     PT Frequency: 5x/wk OT Frequency: 5x/wk            Contractures Contractures Info: Not present    Additional Factors Info  Code Status, Allergies, Insulin Sliding Scale Code Status Info: DNR Allergies Info: Diltiazem, Diltiazem, Metoprolol, Metoprolol   Insulin Sliding Scale Info: 0-6 units 3x/day with meals       Current Medications (01/04/2020):  This is the current hospital active medication list Current Facility-Administered Medications  Medication Dose Route Frequency Provider Last Rate Last Admin  . acetaminophen (TYLENOL) tablet 650 mg  650 mg Oral Q6H PRN Aslam, Sadia, MD       Or  . acetaminophen (TYLENOL) suppository 650 mg  650 mg Rectal Q6H PRN  Harvie Heck, MD      . Derrill Memo ON 01/05/2020] amLODipine (NORVASC) tablet 5 mg  5 mg Oral Daily Aslam, Sadia, MD      . aspirin chewable tablet 81 mg  81 mg Oral Daily Aslam, Sadia, MD   81 mg at 01/04/20 1034  . [START ON 01/05/2020] atorvastatin (LIPITOR) tablet 40 mg  40 mg Oral Daily Aslam, Sadia, MD      . furosemide (LASIX) tablet 40 mg  40 mg Oral BID Aslam, Sadia, MD      . hydrALAZINE (APRESOLINE) tablet 75 mg  75 mg Oral TID Harvie Heck,  MD   75 mg at 01/04/20 1456  . insulin aspart (novoLOG) injection 0-6 Units  0-6 Units Subcutaneous TID WC Harvie Heck, MD      . Warfarin - Pharmacist Dosing Inpatient   Does not apply q1600 Bartholomew Crews, MD   Given at 01/04/20 1516     Discharge Medications: Please see discharge summary for a list of discharge medications.  Relevant Imaging Results:  Relevant Lab Results:   Additional Information SS#: [id number]  Geralynn Ochs, LCSW

## 2020-01-04 NOTE — Progress Notes (Signed)
  Echocardiogram 2D Echocardiogram has been performed.  Marybelle Killings 01/04/2020, 9:27 AM

## 2020-01-04 NOTE — Progress Notes (Signed)
ANTICOAGULATION CONSULT NOTE -follow up  Pharmacy Consult for Warfarin  Indication: stroke, history of DVT  Allergies  Allergen Reactions   Diltiazem Other (See Comments)    Symptomatic bradaycardia   Diltiazem     unknown   Metoprolol Other (See Comments)    Symptomatic bradycardia   Metoprolol     unknown    Patient Measurements: Weight: 58.1 kg Height: 65 inches  Vital Signs: Temp: 98.5 F (36.9 C) (06/11 0810) Temp Source: Oral (06/11 0810) BP: 191/76 (06/11 0810) Pulse Rate: 75 (06/11 0810)  Labs: Recent Labs    01/02/20 1639 01/02/20 1639 01/03/20 1626 01/04/20 0718  HGB 11.4*   < > 11.1* 10.5*  HCT 35.7*  --  34.4* 32.5*  PLT 302  --  291 269  APTT  --   --  28  --   LABPROT 18.1*  --  18.3* 19.8*  INR 1.6*  --  1.6* 1.8*  CREATININE 3.33*  --  3.50* 3.42*   < > = values in this interval not displayed.    Estimated Creatinine Clearance: 9.8 mL/min (A) (by C-G formula based on SCr of 3.42 mg/dL (H)).   Medical History: Past Medical History:  Diagnosis Date   Anemia     normocytic anemia with baseline hemoglobin 10-11   Blindness of left eye     likely related to stroke, left cataract removed from that eye with complications   Calculus gallbladder and bile duct with cholecystitis with obstruction 01/2015   Chronic kidney disease 2006    left renal artery stenosis with probable hemodynamic significance, kidneys are normal in morphology without focal lesions or hydronephrosis this is based but cannot A. of the abdomen with and without contrast done on the 31st 2006   CKD (chronic kidney disease), stage III    CVA (cerebral vascular accident) [facility]) 1990's   CVA (cerebrovascular accident) [facility])  October 2007    CT of the head atrophy with multiple remote insults noted but no definite acute findings   Diabetes mellitus without complication (HCC)    Glaucoma    Gout    Hyperlipidemia    Hypertension    Personal history of DVT (deep  vein thrombosis) 08/11/2010   BLE   Stroke ([place])    Type II diabetes mellitus [facility])     Assessment: 86 yr old female with medical hx of dementia, prior CVA, T2DM, HTN, chronic anemia, and DJD presented to Internal Medicine Clinic 01/03/20 for follow up after recent ED visit following a fall. At ED visit, CT scan showed new acute-subacute infarct in occipital lobe (compared to CT scan a few days prior) and no acute intracranial hemorrhage. Pt takes warfarin 5 mg PO daily at home for hx of DVT (she is followed at [facility] clinic; last visit was on 12/31/19, when INR was 1.3).   INR 1.6 on admission 6/10 (unclear when pt took last dose of warfarin PTA).  6/10 PM MRI positive for acute and confluence R PCA territory infarct, superimposed on chronic R MCA and PCA encephalomalacia; no acute hemorrhage or mass effect. Neuro noted high suspicion for PAF.   Neuro said to continue warfarin.  Warfarin 7.5 mg given last night 6/10.   INR is 1.8 today. No bleeding reported.  Hgb slight decrease to 10.5 and PLTC is within normal limits/stable.    Goal of Therapy:  INR 2-3 Monitor platelets by anticoagulation protocol: Yes   Plan:  Warfarin 5 mg PO X 1 today Monitor  daily INR, CBC Monitor for signs/symptoms of bleeding  Nicole Cella, RPh Clinical Pharmacist 01/04/2020,10:28 AM

## 2020-01-04 NOTE — Evaluation (Signed)
Physical Therapy Evaluation Patient Details Name: Chelsea Mcdaniel MRN: [id number] DOB: 21-Jan-1928 Today's Date: 01/04/2020   History of Present Illness  [name] is a 86 yo F w/ PMH of dementia, prior CVA, T2DM, HTN, Chronic anemia, and DJD presenting to [facility] after fall. She was found to have acute CVA on recent imaging in ED and was consulted to hospital team for direct admission.  Clinical Impression  Patient received in bed, agrees to PT session. Slow to respond to questions and directions. Requires mod/max+2 for bed mobility. Decreased initiation. Mod +2 assist for sit to stand. Mod +2 assist for ambulation over from bed to recliner and back. Max multimodal cues for mobility required. She will continue to benefit from skilled PT while here to improve strength, safety and functional independence.      Follow Up Recommendations SNF;Supervision/Assistance - 24 hour    Equipment Recommendations  None recommended by PT    Recommendations for Other Services       Precautions / Restrictions Precautions Precautions: Fall Restrictions Weight Bearing Restrictions: No      Mobility  Bed Mobility Overal bed mobility: Needs Assistance Bed Mobility: Supine to Sit;Sit to Supine     Supine to sit: Max assist;+2 for physical assistance Sit to supine: Max assist;+2 for physical assistance   General bed mobility comments: decreased initiation of movement.  Transfers Overall transfer level: Needs assistance Equipment used: 2 person hand held assist Transfers: Sit to/from Stand Sit to Stand: Mod assist;+2 physical assistance            Ambulation/Gait Ambulation/Gait assistance: Mod assist;+2 physical assistance Gait Distance (Feet): 6 Feet Assistive device: 2 person hand held assist Gait Pattern/deviations: Step-to pattern;Decreased step length - right;Decreased step length - left;Shuffle Gait velocity: decr   General Gait Details: ambulated 3 feet to recliner with constant cues  for stepping positioning, then 3 feet back to bed.  Stairs            Wheelchair Mobility    Modified Rankin (Stroke Patients Only) Modified Rankin (Stroke Patients Only) Pre-Morbid Rankin Score: No symptoms Modified Rankin: Moderately severe disability     Balance Overall balance assessment: Needs assistance Sitting-balance support: Feet supported Sitting balance-Leahy Scale: Fair Sitting balance - Comments: Able to sit on edge of bed once positioned there with supervision   Standing balance support: Bilateral upper extremity supported;During functional activity Standing balance-Leahy Scale: Poor Standing balance comment: reliant on assistance for standing balance                             Pertinent Vitals/Pain Pain Assessment: Faces Faces Pain Scale: Hurts a little bit Pain Location: left UE at IV site Pain Descriptors / Indicators: Burning Pain Intervention(s): Monitored during session    Home Living Family/patient expects to be discharged to:: Private residence Living Arrangements: Alone               Additional Comments: Unsure of home environment, she reports she lives alone, states she walks without anything. Per admission notes she feel out of wheelchair. Was with her daughter    Prior Function                 Hand Dominance        Extremity/Trunk Assessment   Upper Extremity Assessment Upper Extremity Assessment: Defer to OT evaluation    Lower Extremity Assessment Lower Extremity Assessment: Generalized weakness    Cervical / Trunk Assessment Cervical /  Trunk Assessment: Kyphotic  Communication   Communication: No difficulties  Cognition Arousal/Alertness: Awake/alert Behavior During Therapy: WFL for tasks assessed/performed Overall Cognitive Status: No family/caregiver present to determine baseline cognitive functioning                                        General Comments      Exercises      Assessment/Plan    PT Assessment Patient needs continued PT services  PT Problem List Decreased strength;Decreased mobility;Decreased safety awareness;Decreased activity tolerance;Decreased balance;Decreased cognition;Decreased coordination       PT Treatment Interventions DME instruction;Therapeutic activities;Gait training;Therapeutic exercise;Patient/family education;Functional mobility training;Neuromuscular re-education;Balance training    PT Goals (Current goals can be found in the Care Plan section)  Acute Rehab PT Goals Patient Stated Goal: none stated Time For Goal Achievement: 01/18/20    Frequency Min 4X/week   Barriers to discharge        Co-evaluation PT/OT/SLP Co-Evaluation/Treatment: Yes Reason for Co-Treatment: To address functional/ADL transfers;For patient/therapist safety PT goals addressed during session: Mobility/safety with mobility;Balance         AM-PAC PT "6 Clicks" Mobility  Outcome Measure Help needed turning from your back to your side while in a flat bed without using bedrails?: A Lot Help needed moving from lying on your back to sitting on the side of a flat bed without using bedrails?: A Lot Help needed moving to and from a bed to a chair (including a wheelchair)?: A Lot Help needed standing up from a chair using your arms (e.g., wheelchair or bedside chair)?: A Lot Help needed to walk in hospital room?: A Lot Help needed climbing 3-5 steps with a railing? : Total 6 Click Score: 11    End of Session Equipment Utilized During Treatment: Gait belt Activity Tolerance: Patient tolerated treatment well Patient left: in bed;with call bell/phone within reach;with bed alarm set Nurse Communication: Mobility status PT Visit Diagnosis: Unsteadiness on feet (R26.81);Other abnormalities of gait and mobility (R26.89);History of falling (Z91.81);Muscle weakness (generalized) (M62.81);Hemiplegia and hemiparesis;Difficulty in walking, not elsewhere  classified (R26.2) Hemiplegia - Right/Left: Left Hemiplegia - dominant/non-dominant: Non-dominant Hemiplegia - caused by: Cerebral infarction    Time: 1026-1105 PT Time Calculation (min) (ACUTE ONLY): 39 min   Charges:   PT Evaluation $PT Eval Moderate Complexity: 1 Mod PT Treatments $Gait Training: 8-22 mins        Yasiel Goyne, PT, GCS 01/04/20,12:57 PM

## 2020-01-04 NOTE — Progress Notes (Signed)
Pt is pocketing medications and fluids refusing to swallow, spit out or have mouth suctioned.  Unable to have compliance with taking po medications. Pt refused po warfarin and po hydralazine this afternoon. SBP 203 with MEWS of 2, notified Dr. Marva Panda, MD of MEWS score and pocketing of medications and fluids.  Requested order for IV antihypertensives and for conversion of po meds to IV when possible.  Will continue to monitor both patient's willingness/ability to take po meds and SBP.

## 2020-01-04 NOTE — Evaluation (Signed)
Clinical/Bedside Swallow Evaluation Patient Details  Name: Chelsea Mcdaniel MRN: [id number] Date of Birth: 07/23/1928  Today's Date: 01/04/2020 Time: SLP Start Time (ACUTE ONLY): 1215 SLP Stop Time (ACUTE ONLY): 1245 SLP Time Calculation (min) (ACUTE ONLY): 30 min  Past Medical History:  Past Medical History:  Diagnosis Date  . Anemia     normocytic anemia with baseline hemoglobin 10-11  . Blindness of left eye     likely related to stroke, left cataract removed from that eye with complications  . Calculus gallbladder and bile duct with cholecystitis with obstruction 01/2015  . Chronic kidney disease 2006    left renal artery stenosis with probable hemodynamic significance, kidneys are normal in morphology without focal lesions or hydronephrosis this is based but cannot A. of the abdomen with and without contrast done on the 31st 2006  . CKD (chronic kidney disease), stage III   . CVA (cerebral vascular accident) ([place]) 1990's  . CVA (cerebrovascular accident) [facility])  October 2007    CT of the head atrophy with multiple remote insults noted but no definite acute findings  . Diabetes mellitus without complication ([place])   . Glaucoma   . Gout   . Hyperlipidemia   . Hypertension   . Personal history of DVT (deep vein thrombosis) 08/11/2010   BLE  . Stroke ([place])   . Type II diabetes mellitus ([place])    Past Surgical History:  Past Surgical History:  Procedure Laterality Date  . ABDOMINAL HYSTERECTOMY    . CATARACT EXTRACTION Left   . TRANSTHORACIC ECHOCARDIOGRAM   May 2008    left ventricular systolic function normal EF estimated range of 55-60%, no definite diagnostic evidence of left ventricular regional wall motion abnormalities, Doppler parameters consistent with abnormal left ventricular relaxation, findings suggestive of possible bicuspid aortic valve and aortic valve thickness mildly increase   HPI:  86yo female admitted 01/03/20 with stroke and fall. PMH: dementia, prior CVAs, DM2,  HLD, HTN, chronic anemia, DJD, left eye blindness, CKD3, glaucoma, gout. MRI = Positive for acute and confluent Right PCA territory infarct, superimposed on Chronic right MCA and PCA encephalomalacia. No acute hemorrhage. No significant mass effect. Chronic left MCA infarct, and fairly extensive chronic small vessel disease. CXR negative   Assessment / Plan / Recommendation Clinical Impression  Pt seen during lunch with regular solids and thin liquids, to assess swallow function and safety. Pt presents with missing dentition, but oral motor strength and function are adequate. Pt accepted trials of thin liquid, puree, soft solid and solid textures. Extended oral prep noted with solid textures, with pt spitting out chewed food prior to swallowing it. Soft solids, puree, and thin liquid textures were tolerated without difficulty.   Pt presents with what appears to be cognitively based oropharyngeal dysphagia characterized by poor awareness of bolus, prolonged oral transit, and oral holding/residuals (with solids). No overt s/s aspiration on any consistency. SLP suspects that patient has a normal oropharyngeal swallow at baseline.   Will downgrade to ground solids and thin liquids. Crushed meds recommended, as well as 1:1 assist with all PO intake.  Safe swallow precautions posted at [facility]. SLP will follow for assesment of diet tolerance and to provide ongoing education.    SLP Visit Diagnosis: Dysphagia, unspecified (R13.10)    Aspiration Risk  Mild aspiration risk;Risk for inadequate nutrition/hydration    Diet Recommendation Dysphagia 2 (Fine chop);Thin liquid   Liquid Administration via: Cup;Straw Medication Administration: Crushed with puree Supervision: Full supervision/cueing for compensatory strategies;Staff to assist  with self feeding Compensations: Minimize environmental distractions;Slow rate;Small sips/bites Postural Changes: Seated upright at 90 degrees;Remain upright for at least 30  minutes after po intake    Other  Recommendations Oral Care Recommendations: Oral care BID Other Recommendations: Have oral suction available   Follow up Recommendations 24 hour supervision/assistance      Frequency and Duration min 1 x/week  1 week       Prognosis Prognosis for Safe Diet Advancement: Fair Barriers to Reach Goals: Cognitive deficits      Swallow Study   General Date of Onset: 01/03/20 HPI: 86yo female admitted 01/03/20 with stroke and fall. PMH: dementia, prior CVAs, DM2, HLD, HTN, chronic anemia, DJD, left eye blindness, CKD3, glaucoma, gout. MRI = Positive for acute and confluent Right PCA territory infarct, superimposed on Chronic right MCA and PCA encephalomalacia. No acute hemorrhage. No significant mass effect. Chronic left MCA infarct, and fairly extensive chronic small vessel disease. CXR negative Type of Study: Bedside Swallow Evaluation Previous Swallow Assessment: none found Diet Prior to this Study: Regular Temperature Spikes Noted: No Respiratory Status: Room air History of Recent Intubation: No Behavior/Cognition: Alert;Cooperative;Pleasant mood Oral Cavity Assessment: Within Functional Limits Oral Care Completed by SLP: Yes Oral Cavity - Dentition: Missing dentition Vision: Functional for self-feeding Self-Feeding Abilities: Total assist Patient Positioning: Upright in bed Baseline Vocal Quality: Normal Volitional Cough: Weak Volitional Swallow: Able to elicit    Oral/Motor/Sensory Function Overall Oral Motor/Sensory Function: Within functional limits   Ice Chips Ice chips: Not tested   Thin Liquid Thin Liquid: Within functional limits Presentation: Straw    Nectar Thick Nectar Thick Liquid: Not tested   Honey Thick Honey Thick Liquid: Not tested   Puree Puree: Within functional limits Presentation: Spoon   Solid     Solid: Impaired Oral Phase Impairments: Poor awareness of bolus;Impaired mastication Oral Phase Functional  Implications: Prolonged oral transit;Oral residue;Impaired mastication;Oral holding     [name] Ore, [facility], [place] Speech Language Pathologist Office: [phone] Pager: [phone]  [name] 01/04/2020,12:46 PM

## 2020-01-04 NOTE — TOC Initial Note (Signed)
Transition of Care [facility]) - Initial/Assessment Note    Patient Details  Name: Chelsea Mcdaniel MRN: [id number] Date of Birth: July 28, 1927  Transition of Care [facility] CM/SW Contact:    Geralynn Ochs, LCSW Phone Number: 01/04/2020, 4:46 PM  Clinical Narrative:      CSW met with patient's daughter-in-law, [name], at bedside to discuss recommendation for SNF. Patient's family on board, as she needs more assist than usual right now. CSW discussed CMS choice, and patient's family would prefer. CSW sent referral to [facility], waiting on response. CSW asked them to review. CSW to follow.            Expected Discharge Plan: Skilled Nursing Facility Barriers to Discharge: Insurance Authorization, Continued Medical Work up   Patient Goals and CMS Choice Patient states their goals for this hospitalization and ongoing recovery are:: patient unable to participate in goal setting due to confusion CMS Medicare.gov Compare Post Acute Care list provided to:: Patient Represenative (must comment) Choice offered to / list presented to : Adult Children, HC POA / Guardian  Expected Discharge Plan and Services Expected Discharge Plan: [place] Choice: [place] arrangements for the past 2 months: Single Family Home                                      Prior Living Arrangements/Services Living arrangements for the past 2 months: Single Family Home Lives with:: Adult Children Patient language and need for interpreter reviewed:: No Do you feel safe going back to the place where you live?: Yes      Need for Family Participation in Patient Care: Yes (Comment) Care giver support system in place?: Yes (comment) Current home services: Homehealth aide, DME Criminal Activity/Legal Involvement Pertinent to Current Situation/Hospitalization: No - Comment as needed  Activities of Daily Living      Permission Sought/Granted Permission  sought to share information with : Facility Sport and exercise psychologist, Family Supports Permission granted to share information with : Yes, Verbal Permission Granted  Share Information with NAME: Lonie Peak  Permission granted to share info w AGENCY: SNF  Permission granted to share info w Relationship: Son, DIL     Emotional Assessment Appearance:: Appears stated age Attitude/Demeanor/Rapport: Unable to Assess   Orientation: : Oriented to Self, Oriented to Place Alcohol / Substance Use: Not Applicable Psych Involvement: No (comment)  Admission diagnosis:  CVA (cerebral vascular accident) [facility]) [I63.9] Patient Active Problem List   Diagnosis Date Noted  . Occipital stroke ([place]) 01/03/2020  . Epidermoid cyst of skin of chest 09/17/2019  . Insomnia 07/10/2019  . Protein-calorie malnutrition, severe 07/14/2018  . Dementia ([place]) 07/12/2018  . Fall at home 07/11/2018  . Malaise and fatigue 05/02/2018  . Weight loss 02/28/2018  . bilateral lower extremity edema  04/18/2017  . Senile purpura ([place]) 12/18/2016  . Immunization not carried out because of parent refusal 12/18/2016  . Advance care planning 02/18/2016  . Fatigue 10/20/2015  . Unequal blood pressure in upper extremities 10/20/2015  . Choledocholithiasis 02/11/2015  . Constipation 01/01/2015  . Cervical radiculopathy 12/10/2014  . Left knee pain 08/24/2014  . Vitamin D insufficiency 08/24/2014  . Carpal tunnel syndrome 01/07/2014  . 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 Healthcare maintenance 08/15/2013  . CKD (chronic kidney disease) stage 4, GFR 15-29 ml/min (HCC) 02/16/2012  . Osteoarthritis  of hand 02/16/2012  . Long-term (current) use of anticoagulants 08/11/2010  . History of DVT (deep vein thrombosis) 08/11/2010  . Type 2 diabetes mellitus ([place] ) 12/12/2006  . Hyperlipidemia 12/12/2006  . Anemia of chronic disease 12/12/2006  . Hypertension associated with diabetes ([place]) 12/12/2006   . Late effect of cerebrovascular accident (CVA) 09/19/2006   PCP:  [name], MD Pharmacy:   [facility] [place], [place] - [address] [place] [address] Phone: [phone] Fax: [phone]     Social Determinants of Health (SDOH) Interventions    Readmission Risk Interventions No flowsheet data found.

## 2020-01-04 NOTE — Assessment & Plan Note (Addendum)
In summary, Chelsea Mcdaniel is a 86 year old female with a past medical history significant for multiple CVAs, HTN, T2DM, HLD and CKD who presented to the clinic after emergency room visit.  Yesterday, the patient went to the ED after sustaining 2 falls in the last 3 days.  The most recent fall was while she was sitting in her wheelchair and leaned forward and fell over onto her face.  She broke her glasses and hit her face on the pavement.  No LOC.  She went to the ED where she received a CT scan which demonstrated a subacute infarct of the occipital lobe.  The patient had visited the ED 2 days prior for a fall she had, and the CT at that time only showed chronic changes from previous CVAs.   On exam, the patient has poor vision, but no new focal deficits.  Her cognitive function is at her baseline per her daughter in law who was present during the visit.  Due to the fact that the patient has had a new stroke and the daughter reports the patient has had multiple falls and poor balance compared to her baseline, I think it is appropriate to directly admit this patient to have a neurological work-up that includes echocardiogram and carotid Dopplers as well as PT/OT evaluations to see if she needs placement.  In addition, the patient takes warfarin and has not been taking her medications consistently.  Daughter-in-law states that she spits out her medications occasionally.  Her last INR was checked at the ED yesterday, and was subtherapeutic at 1.3.  Perhaps this most recent stroke was due to an embolic event.  Plan: -Directly admitted to internal medicine teaching service -Order CBC, CMP, lipid panel, A1c, PT/PTT, INR to initiate work-up

## 2020-01-04 NOTE — Progress Notes (Signed)
  Date: 01/04/2020  Patient name: Chelsea Mcdaniel  Medical record number: [id number]  Date of birth: 03-25-1928        I have seen and evaluated this patient and I have discussed the plan of care with the house staff. Please see their note for complete details. I concur with their findings with the following additions/corrections: Chelsea Mcdaniel was seen this morning on team rounds having been admitted for a stroke, likely cardioembolic.  She is a 86 year old community dwelling ambulatory woman requiring assistance with ADLs due to dementia.  She had an unwitnessed fall from a seated position.  In the ED, MRI showed an acute right PCA territory infarct, chronic right MCA and PCA encephalomalacia, chronic left MCA infarct, and extensive chronic small vessel disease.  She is on warfarin for prior DVT but the last time her INR was therapeutic was May 10.  Her blood pressure is in the 190s over 70s, heart rate 70, respiratory rate 18, O2 sat 100% on room air General no acute distress lying in bed HEENT ecchymosis around left eye H RRR grade 3 systolic murmur heard throughout Lungs CTA anteriorly Abdomen positive bowel sounds Neuro examination per Dr. Marva Panda  Pertinent results include INR 1.6 Creatinine 3.42, increasing over the past 2 years Hemoglobin 10.5, MCV 95, RDW 15  Preliminary carotid Dopplers consistent with 1 to 39% stenosis bilaterally  Echo EF 60 to 65%, moderate LVH, grade 2 diastolic dysfunction, moderate AS  Assessment Chelsea Mcdaniel is a 86 year old community dwelling ambulatory woman who requires assistance with her ADLs due to underlying dementia.  She presents with an acute CVA of the right PCA territory felt to be cardioembolic.  She is on warfarin for prior DVT but her INR has been therapeutic for 1 month.  She has no known history of atrial fibrillation or other arrhythmia and her echo does not show any intracardiac thrombus.  Due to her comorbidities, intensive work-up is likely not  going to be of benefit.  She has an indication for anticoagulation-she has an attentive caregiver, switching to a DOAC might be beneficial.  1.  Acute right PCA infarct, likely cardioembolic -Discussed changing warfarin to DOAC with caretaker and neurology -Follow-up neurology's note from today -Continue aspirin and high intensity statin -Continue telemetry monitoring but Holter monitor likely will not change treatment -PT OT recommend SNF, discussed with family  2.  Stage IV CKD -Avoid nephrotoxins -Has been deemed not an HD candidate due to comorbidities  Other problems per Dr. Margy Clarks progress note  Bartholomew Crews, MD 01/04/2020, 2:14 PM

## 2020-01-04 NOTE — Progress Notes (Signed)
SLP Cancellation Note  Patient Details Name: Chelsea Mcdaniel MRN: [id number] DOB: [birth year]   Cancelled treatment:       Reason Eval/Treat Not Completed: Patient at procedure or test/unavailable. Attempted x2 to complete BSE. Pt with PT/OT initially, and now having ultrasound at bedside.   RN reports oral holding of meds, and significantly extended oral prep with regular solids. Will continue efforts to evaluate swallow function and safety.   [name] B. Quentin Ore, [facility], [place] Speech Language Pathologist Office: [phone] Pager: [phone]  [name] 01/04/2020, 11:07 AM

## 2020-01-05 LAB — CBC
HCT: 34.3 % — ABNORMAL LOW (ref 36.0–46.0)
Hemoglobin: 11.1 g/dL — ABNORMAL LOW (ref 12.0–15.0)
MCH: 30.7 pg (ref 26.0–34.0)
MCHC: 32.4 g/dL (ref 30.0–36.0)
MCV: 94.8 fL (ref 80.0–100.0)
Platelets: 295 10*3/uL (ref 150–400)
RBC: 3.62 MIL/uL — ABNORMAL LOW (ref 3.87–5.11)
RDW: 14.6 % (ref 11.5–15.5)
WBC: 5.9 10*3/uL (ref 4.0–10.5)
nRBC: 0 % (ref 0.0–0.2)

## 2020-01-05 LAB — RENAL FUNCTION PANEL
Albumin: 3.1 g/dL — ABNORMAL LOW (ref 3.5–5.0)
Anion gap: 11 (ref 5–15)
BUN: 35 mg/dL — ABNORMAL HIGH (ref 8–23)
CO2: 25 mmol/L (ref 22–32)
Calcium: 8.9 mg/dL (ref 8.9–10.3)
Chloride: 101 mmol/L (ref 98–111)
Creatinine, Ser: 3.2 mg/dL — ABNORMAL HIGH (ref 0.44–1.00)
GFR calc Af Amer: 14 mL/min — ABNORMAL LOW (ref >60)
GFR calc non Af Amer: 12 mL/min — ABNORMAL LOW (ref >60)
Glucose, Bld: 118 mg/dL — ABNORMAL HIGH (ref 70–99)
Phosphorus: 4 mg/dL (ref 2.5–4.6)
Potassium: 3.1 mmol/L — ABNORMAL LOW (ref 3.5–5.1)
Sodium: 137 mmol/L (ref 135–145)

## 2020-01-05 LAB — GLUCOSE, CAPILLARY
Glucose-Capillary: 109 mg/dL — ABNORMAL HIGH (ref 70–99)
Glucose-Capillary: 158 mg/dL — ABNORMAL HIGH (ref 70–99)
Glucose-Capillary: 142 mg/dL — ABNORMAL HIGH (ref 70–99)
Glucose-Capillary: 148 mg/dL — ABNORMAL HIGH (ref 70–99)

## 2020-01-05 LAB — PROTIME-INR
INR: 1.9 — ABNORMAL HIGH (ref 0.8–1.2)
Prothrombin Time: 21.2 seconds — ABNORMAL HIGH (ref 11.4–15.2)

## 2020-01-05 LAB — SARS CORONAVIRUS 2 (TAT 6-24 HRS): SARS Coronavirus 2: NEGATIVE

## 2020-01-05 LAB — MAGNESIUM: Magnesium: 2 mg/dL (ref 1.7–2.4)

## 2020-01-05 MED ORDER — FUROSEMIDE 10 MG/ML IJ SOLN
40.0000 mg | Freq: Two times a day (BID) | INTRAMUSCULAR | Status: DC
  Administered 2020-01-05: 40 mg via INTRAVENOUS
  Filled 2020-01-05 (×2): qty 4

## 2020-01-05 MED ORDER — WARFARIN SODIUM 5 MG PO TABS
5.0000 mg | ORAL_TABLET | Freq: Once | ORAL | Status: AC
  Administered 2020-01-05: 5 mg via ORAL
  Filled 2020-01-05: qty 1

## 2020-01-05 MED ORDER — POTASSIUM CHLORIDE CRYS ER 20 MEQ PO TBCR
40.0000 meq | EXTENDED_RELEASE_TABLET | Freq: Four times a day (QID) | ORAL | Status: AC
  Administered 2020-01-05 (×2): 40 meq via ORAL
  Filled 2020-01-05 (×3): qty 2

## 2020-01-05 NOTE — Progress Notes (Signed)
ANTICOAGULATION CONSULT NOTE -follow up  Pharmacy Consult for Warfarin  Indication: stroke, history of DVT  Allergies  Allergen Reactions  . Diltiazem Other (See Comments)    Symptomatic bradaycardia  . Diltiazem     unknown  . Metoprolol Other (See Comments)    Symptomatic bradycardia  . Metoprolol     unknown    Patient Measurements: Weight: 58.1 kg Height: 65 inches  Vital Signs: Temp: 98.6 F (37 C) (06/12 0749) Temp Source: Oral (06/12 0749) BP: 159/69 (06/12 0749) Pulse Rate: 78 (06/12 0749)  Labs: Recent Labs    01/03/20 1626 01/03/20 1626 01/04/20 0718 01/05/20 0341  HGB 11.1*   < > 10.5* 11.1*  HCT 34.4*  --  32.5* 34.3*  PLT 291  --  269 295  APTT 28  --   --   --   LABPROT 18.3*  --  19.8* 21.2*  INR 1.6*  --  1.8* 1.9*  CREATININE 3.50*  --  3.42* 3.20*   < > = values in this interval not displayed.    Estimated Creatinine Clearance: 10.5 mL/min (A) (by C-G formula based on SCr of 3.2 mg/dL (H)).   Medical History: Past Medical History:  Diagnosis Date  . Anemia     normocytic anemia with baseline hemoglobin 10-11  . Blindness of left eye     likely related to stroke, left cataract removed from that eye with complications  . Calculus gallbladder and bile duct with cholecystitis with obstruction 01/2015  . Chronic kidney disease 2006    left renal artery stenosis with probable hemodynamic significance, kidneys are normal in morphology without focal lesions or hydronephrosis this is based but cannot A. of the abdomen with and without contrast done on the 31st 2006  . CKD (chronic kidney disease), stage III   . CVA (cerebral vascular accident) ([place]) 1990's  . CVA (cerebrovascular accident) [facility])  October 2007    CT of the head atrophy with multiple remote insults noted but no definite acute findings  . Diabetes mellitus without complication ([place])   . Glaucoma   . Gout   . Hyperlipidemia   . Hypertension   . Personal history of DVT (deep vein  thrombosis) 08/11/2010   BLE  . Stroke ([place])   . Type II diabetes mellitus [facility])     Assessment: 86 yr old female with medical hx of dementia, prior CVA, T2DM, HTN, chronic anemia, and DJD presented to Internal Medicine Clinic 01/03/20 for follow up after recent ED visit following a fall. At ED visit, CT scan showed new acute-subacute infarct in occipital lobe (compared to CT scan a few days prior) and no acute intracranial hemorrhage. Pt takes warfarin 5 mg PO daily at home for hx of DVT (she is followed at [facility] clinic; last visit was on 12/31/19, when INR was 1.3).   INR 1.6 on admission 6/10 (unclear when pt took last dose of warfarin PTA).  6/10 PM MRI positive for acute and confluence R PCA territory infarct, superimposed on chronic R MCA and PCA encephalomalacia; no acute hemorrhage or mass effect. Neuro noted high suspicion for PAF.  Neuro said to continue warfarin. INR is 1.9 today. No bleeding reported. Hgb stable at 11.1 and PLTC is within normal limits/stable.    Goal of Therapy:  INR 2-3 Monitor platelets by anticoagulation protocol: Yes   Plan:  Warfarin 5 mg PO X 1 today Monitor daily INR, CBC Monitor for signs/symptoms of bleeding  Sherren Kerns, PharmD PGY1  Acute Care Pharmacy Resident 01/05/2020,8:14 AM

## 2020-01-05 NOTE — Progress Notes (Signed)
Subjective: HD2 Overnight, patient noted to be pocketing oral meds in mouth and spitting them out. Oral meds changed to IV.  [name] was evaluated at bedside this morning. She was noted to be AAOx1. Believes year is 'something 76' Unsure of her location. Denies any complaints at this time.  Objective:  Vital signs in last 24 hours: Vitals:   01/04/20 2335 01/05/20 0324 01/05/20 0500 01/05/20 0749  BP: (!) 158/89 (!) 157/86  (!) 159/69  Pulse: 72 71  78  Resp: 14 17  18   Temp: 98.6 F (37 C) 98.6 F (37 C)  98.6 F (37 C)  TempSrc: Oral Oral  Oral  SpO2: 99% 97%  100%  Weight:   61.2 kg    CBC Latest Ref Rng & Units 01/05/2020 01/04/2020 01/03/2020  WBC 4.0 - 10.5 K/uL 5.9 5.9 6.9  Hemoglobin 12.0 - 15.0 g/dL 11.1(L) 10.5(L) 11.1(L)  Hematocrit 36 - 46 % 34.3(L) 32.5(L) 34.4(L)  Platelets 150 - 400 K/uL 295 269 291   CMP Latest Ref Rng & Units 01/05/2020 01/04/2020 01/03/2020  Glucose 70 - 99 mg/dL 118(H) 141(H) 164(H)  BUN 8 - 23 mg/dL 35(H) 37(H) 39(H)  Creatinine 0.44 - 1.00 mg/dL 3.20(H) 3.42(H) 3.50(H)  Sodium 135 - 145 mmol/L 137 136 137  Potassium 3.5 - 5.1 mmol/L 3.1(L) 3.7 3.4(L)  Chloride 98 - 111 mmol/L 101 101 102  CO2 22 - 32 mmol/L 25 24 23   Calcium 8.9 - 10.3 mg/dL 8.9 8.7(L) 9.1  Total Protein 6.5 - 8.1 g/dL - - 6.8  Total Bilirubin 0.3 - 1.2 mg/dL - - 0.7  Alkaline Phos 38 - 126 U/L - - 75  AST 15 - 41 U/L - - 23  ALT 0 - 44 U/L - - 11   Gen: Well-developed, well nourished, NAD CV: RRR, S1, S2 normal, grade 3 systolic murmur Pulm: Breathing comfortably on room air, no accessory muscle use Abd: Soft, BS+, NTND, No rebound, no guarding Extm: Peripheral pulses intact, RLE size >LLE Skin: Dry, Warm, normal turgor, well-healing nasal bridge wound Neuro: AAOx1, LUE 2/5, 5/5 strength other extremities  MRI BRAIN:  IMPRESSION: 1. Positive for acute and confluent Right PCA territory infarct, superimposed on Chronic right MCA and PCA  encephalomalacia. No acute hemorrhage. No significant mass effect. 2. Mild chronic blood products. Chronic left MCA infarct, and fairly extensive chronic small vessel disease. 3. Skull base and cervical spine degeneration.  ECHO: IMPRESSIONS  1. Left ventricular ejection fraction, by estimation, is 60 to 65%. The  left ventricle has normal function. The left ventricle has no regional  wall motion abnormalities. There is moderate left ventricular hypertrophy.  Left ventricular diastolic  parameters are consistent with Grade II diastolic dysfunction  (pseudonormalization). Elevated left atrial pressure.  2. Right ventricular systolic function is normal. The right ventricular  size is normal.  3. The mitral valve is degenerative. No evidence of mitral valve  regurgitation. Mild mitral stenosis. The mean mitral valve gradient is 5.0  mmHg.  4. The aortic valve was not well visualized. Aortic valve regurgitation  is not visualized. Moderate aortic valve stenosis. Aortic valve mean  gradient measures 29.0 mmHg. Aortic valve Vmax measures 3.20 m/s.  5. The inferior vena cava is normal in size with <50% respiratory  variability, suggesting right atrial pressure of 8 mmHg.   US CAROTID Summary:  Right Carotid: Velocities in the right ICA are consistent with a 1-39%  stenosis.  Left Carotid: Velocities in the left ICA  are consistent with a 1-39%  stenosis.  Vertebrals: Bilateral vertebral arteries demonstrate antegrade flow.   TRANSCRANIAL DOPPLER US:  Summary:  Absent bitemporal windows limits evaluation. Normal mean flow velocities  in both opthalmics, carotid siphons , vertebrals and basilar arteries.   Assessment/Plan:  Active Problems:   Occipital stroke [facility]) [name] is a 86 year old female with PMHx of dementia, prior CVA, type II DM, hypertension admitted for acute right PCA infarct.   Acute right occipital CVA: Presented w/ Left sided hemiparesis. MRI  showing RPCA territory infarct. Likely embolic. Stroke work-up w/ carotid US & Echo complete w/o source of embolus. Currently on warfarin. Per cardiology, recommend switching to [place]. INR 1.9 this am - Will discuss w/ family regarding continuing anti-coagulation vs stopping - Can consider low-dose eliquis  - D/c to SNF when available - C/w aspirin, atorvastatin  CKD4: Patient with hx of CKD4, not good candidate for dialysis due to age. Patient has history of hypervolemia in setting of her chronic renal disease. Patient on lasix 80mg  bid as outpatient. Currently on IV furosemide 20mg  BID 750cc output yesterday. Euvolemic on exam. K 3.1 - Continue to monitor renal function - Repleting K - Increase IV furosemide to 40mg  BID - Strict I&O, daily weights - Avoid nephrotoxic agents  Hypertension: Patient is on amlodipine 5mg  daily, furosemide 80mg  bid, hydralazine 75mg  tid and clonidine 0.3mg  weekly at baseline. Initially held in setting of permissive HTN. Am bp 159/69 - C/w home meds: amlodipine 5mg  daily, hydralazine 75mg  TID - Increasing diuresis as noted above   Chronic RLE DVT on coumadin: INR 1.9. No evidence of bleed - Continue warfarin per pharmacy dosing - INR and CBC monitoring   Prior to Admission Living Arrangement: Home  Anticipated Discharge Location: SNF Barriers to Discharge: Continued medical management  Dispo: Anticipated discharge in approximately 1-2 day(s).   Mosetta Anis, MD  Internal Medicine, PGY-2 01/05/2020, 10:34 AM Pager: [phone] After 5pm on weekdays and 1pm on weekends: On Call pager [phone]

## 2020-01-05 NOTE — TOC Progression Note (Addendum)
Transition of Care [facility]) - Progression Note    Patient Details  Name: KENIAH KLEMMER MRN: [id number] Date of Birth: July 26, 1928  Transition of Care [facility]) CM/SW [place], [place] Phone Number: 01/05/2020, 12:43 PM  Clinical Narrative:     CSW started insurance authorization for patient. Reference number is [id number]. Start date for insurance starts tomorrow 6/13.Clinicals have been faxed over to [facility] for review. Patient has bed at [facility] when medically ready for discharge.  Patient has SN[facility] bed at [facility]. Insurance authorization is pending reference number is [id number].  TOC team will continue to follow.  Expected Discharge Plan: Skilled Nursing Facility Barriers to Discharge: Ship broker, Continued Medical Work up  Expected Discharge Plan and Services Expected Discharge Plan: [place]: [place] arrangements for the past 2 months: Single Family Home                                       Social Determinants of Health (SDOH) Interventions    Readmission Risk Interventions No flowsheet data found.

## 2020-01-06 LAB — RENAL FUNCTION PANEL
Albumin: 3 g/dL — ABNORMAL LOW (ref 3.5–5.0)
Anion gap: 13 (ref 5–15)
BUN: 34 mg/dL — ABNORMAL HIGH (ref 8–23)
CO2: 24 mmol/L (ref 22–32)
Calcium: 8.6 mg/dL — ABNORMAL LOW (ref 8.9–10.3)
Chloride: 101 mmol/L (ref 98–111)
Creatinine, Ser: 3.14 mg/dL — ABNORMAL HIGH (ref 0.44–1.00)
GFR calc Af Amer: 14 mL/min — ABNORMAL LOW (ref >60)
GFR calc non Af Amer: 12 mL/min — ABNORMAL LOW (ref >60)
Glucose, Bld: 121 mg/dL — ABNORMAL HIGH (ref 70–99)
Phosphorus: 3.8 mg/dL (ref 2.5–4.6)
Potassium: 3.5 mmol/L (ref 3.5–5.1)
Sodium: 138 mmol/L (ref 135–145)

## 2020-01-06 LAB — CBC
HCT: 32.8 % — ABNORMAL LOW (ref 36.0–46.0)
Hemoglobin: 10.7 g/dL — ABNORMAL LOW (ref 12.0–15.0)
MCH: 31 pg (ref 26.0–34.0)
MCHC: 32.6 g/dL (ref 30.0–36.0)
MCV: 95.1 fL (ref 80.0–100.0)
Platelets: 249 10*3/uL (ref 150–400)
RBC: 3.45 MIL/uL — ABNORMAL LOW (ref 3.87–5.11)
RDW: 14.6 % (ref 11.5–15.5)
WBC: 5.8 10*3/uL (ref 4.0–10.5)
nRBC: 0 % (ref 0.0–0.2)

## 2020-01-06 LAB — PROTIME-INR
INR: 2 — ABNORMAL HIGH (ref 0.8–1.2)
Prothrombin Time: 22.4 seconds — ABNORMAL HIGH (ref 11.4–15.2)

## 2020-01-06 LAB — GLUCOSE, CAPILLARY
Glucose-Capillary: 121 mg/dL — ABNORMAL HIGH (ref 70–99)
Glucose-Capillary: 115 mg/dL — ABNORMAL HIGH (ref 70–99)
Glucose-Capillary: 109 mg/dL — ABNORMAL HIGH (ref 70–99)
Glucose-Capillary: 119 mg/dL — ABNORMAL HIGH (ref 70–99)
Glucose-Capillary: 119 mg/dL — ABNORMAL HIGH (ref 70–99)

## 2020-01-06 MED ORDER — LABETALOL HCL 5 MG/ML IV SOLN
5.0000 mg | INTRAVENOUS | Status: DC | PRN
  Administered 2020-01-06 – 2020-01-08 (×3): 5 mg via INTRAVENOUS
  Filled 2020-01-06 (×4): qty 4

## 2020-01-06 MED ORDER — WARFARIN SODIUM 5 MG PO TABS
5.0000 mg | ORAL_TABLET | Freq: Once | ORAL | Status: AC
  Administered 2020-01-06: 5 mg via ORAL
  Filled 2020-01-06: qty 1

## 2020-01-06 MED ORDER — FUROSEMIDE 80 MG PO TABS
80.0000 mg | ORAL_TABLET | Freq: Two times a day (BID) | ORAL | Status: DC
  Administered 2020-01-06 – 2020-01-07 (×4): 80 mg via ORAL
  Filled 2020-01-06 (×6): qty 1

## 2020-01-06 MED ORDER — HYDRALAZINE HCL 20 MG/ML IJ SOLN: 5.0000 mg | INTRAMUSCULAR | Status: DC | PRN

## 2020-01-06 MED ORDER — FUROSEMIDE 10 MG/ML IJ SOLN: 80.0000 mg | Freq: Two times a day (BID) | INTRAMUSCULAR | Status: DC

## 2020-01-06 MED ORDER — AMLODIPINE BESYLATE 10 MG PO TABS
10.0000 mg | ORAL_TABLET | Freq: Every day | ORAL | Status: DC
  Administered 2020-01-06: 10 mg via ORAL
  Filled 2020-01-06 (×3): qty 1

## 2020-01-06 MED ORDER — HYDRALAZINE HCL 50 MG PO TABS
50.0000 mg | ORAL_TABLET | Freq: Three times a day (TID) | ORAL | Status: DC
  Administered 2020-01-06 – 2020-01-07 (×4): 50 mg via ORAL
  Filled 2020-01-06 (×7): qty 1

## 2020-01-06 MED ORDER — POTASSIUM CHLORIDE CRYS ER 20 MEQ PO TBCR
40.0000 meq | EXTENDED_RELEASE_TABLET | Freq: Once | ORAL | Status: AC
  Administered 2020-01-06: 40 meq via ORAL
  Filled 2020-01-06: qty 2

## 2020-01-06 NOTE — Progress Notes (Addendum)
Subjective: HD3 No overnight events  [name] was evaluated at bedside this morning. She was observed sleeping comfortably in bed this am. She is easily arousable but refused participation in neuro exam. Denies any acute complaints. Did not like being awakened by medical team.  Discussed w/ [name], her primary caregiver, regarding risks and benefits of anti-coagulation. She expressed understanding and requests time to discuss with other family members. She states that she would like to continue w/ current treatment at the moment. All other questions and concerns addressed.  Objective:  Vital signs in last 24 hours: Vitals:   01/06/20 0337 01/06/20 0500 01/06/20 0624 01/06/20 0832  BP: (!) 175/72  (!) 185/69 (!) 191/73  Pulse: 72  76 81  Resp: 17   18  Temp: 98 F (36.7 C)   99.4 F (37.4 C)  TempSrc:    Axillary  SpO2: 98%   98%  Weight:  60.8 kg     CBC Latest Ref Rng & Units 01/06/2020 01/05/2020 01/04/2020  WBC 4.0 - 10.5 K/uL 5.8 5.9 5.9  Hemoglobin 12.0 - 15.0 g/dL 10.7(L) 11.1(L) 10.5(L)  Hematocrit 36 - 46 % 32.8(L) 34.3(L) 32.5(L)  Platelets 150 - 400 K/uL 249 295 269   CMP Latest Ref Rng & Units 01/06/2020 01/05/2020 01/04/2020  Glucose 70 - 99 mg/dL 121(H) 118(H) 141(H)  BUN 8 - 23 mg/dL 34(H) 35(H) 37(H)  Creatinine 0.44 - 1.00 mg/dL 3.14(H) 3.20(H) 3.42(H)  Sodium 135 - 145 mmol/L 138 137 136  Potassium 3.5 - 5.1 mmol/L 3.5 3.1(L) 3.7  Chloride 98 - 111 mmol/L 101 101 101  CO2 22 - 32 mmol/L 24 25 24   Calcium 8.9 - 10.3 mg/dL 8.6(L) 8.9 8.7(L)  Total Protein 6.5 - 8.1 g/dL - - -  Total Bilirubin 0.3 - 1.2 mg/dL - - -  Alkaline Phos 38 - 126 U/L - - -  AST 15 - 41 U/L - - -  ALT 0 - 44 U/L - - -   Physical Exam Constitutional:      General: She is not in acute distress.    Appearance: Normal appearance.  HENT:     Nose:     Comments: Well-healing scab on bridge of nose    Mouth/Throat:     Mouth: Mucous membranes are moist.      Pharynx: Oropharynx is clear.  Eyes:     Conjunctiva/sclera: Conjunctivae normal.  Cardiovascular:     Rate and Rhythm: Normal rate and regular rhythm.     Heart sounds: Murmur (grade 3 systolic on LSB) heard.   Pulmonary:     Effort: Pulmonary effort is normal.     Breath sounds: Normal breath sounds. No wheezing.     Comments: Anteriorly auscultated Musculoskeletal:        General: No swelling or deformity.     Right lower leg: Edema (stable, chronic, non-pitting) present.     Left lower leg: No edema.  Skin:    General: Skin is warm and dry.  Neurological:     Mental Status: She is alert.     Comments: AAOx1, nonparticipating on neuro exam    MRI BRAIN:  IMPRESSION: 1. Positive for acute and confluent Right PCA territory infarct, superimposed on Chronic right MCA and PCA encephalomalacia. No acute hemorrhage. No significant mass effect. 2. Mild chronic blood products. Chronic left MCA infarct, and fairly extensive chronic small vessel disease. 3. Skull base and cervical spine degeneration.  ECHO: IMPRESSIONS  1. Left ventricular  ejection fraction, by estimation, is 60 to 65%. The  left ventricle has normal function. The left ventricle has no regional  wall motion abnormalities. There is moderate left ventricular hypertrophy.  Left ventricular diastolic  parameters are consistent with Grade II diastolic dysfunction  (pseudonormalization). Elevated left atrial pressure.  2. Right ventricular systolic function is normal. The right ventricular  size is normal.  3. The mitral valve is degenerative. No evidence of mitral valve  regurgitation. Mild mitral stenosis. The mean mitral valve gradient is 5.0  mmHg.  4. The aortic valve was not well visualized. Aortic valve regurgitation  is not visualized. Moderate aortic valve stenosis. Aortic valve mean  gradient measures 29.0 mmHg. Aortic valve Vmax measures 3.20 m/s.  5. The inferior vena cava is normal in size with <50%  respiratory  variability, suggesting right atrial pressure of 8 mmHg.   US CAROTID Summary:  Right Carotid: Velocities in the right ICA are consistent with a 1-39%  stenosis.  Left Carotid: Velocities in the left ICA are consistent with a 1-39%  stenosis.  Vertebrals: Bilateral vertebral arteries demonstrate antegrade flow.   TRANSCRANIAL DOPPLER US:  Summary:  Absent bitemporal windows limits evaluation. Normal mean flow velocities  in both opthalmics, carotid siphons , vertebrals and basilar arteries.   Assessment/Plan:  Active Problems:   Occipital stroke [facility])  [name] is a 86 year old female with PMHx of dementia, prior CVA, type II DM, hypertension admitted for acute right PCA infarct.   Acute right occipital CVA: Presented w/ Left sided hemiparesis. MRI showing RPCA territory infarct. Likely embolic. Stroke work-up w/ carotid US & Echo complete w/o source of embolus. Currently on warfarin. Per neurology, recommend switching to DOAC but she has CKD4. INR 1.8->1.9->2.0  - D/c to SNF when available - C/w atorvastatin, warfarin  CKD4: Patient with hx of CKD4, not good candidate for dialysis due to age. Patient has history of hypervolemia in setting of her chronic renal disease. Patient on lasix 80mg  bid as outpatient. Currently on IV furosemide 40mg  BID 450cc output yesterday. Euvolemic on exam. K 3.5 this am - Continue to monitor renal function - Restart home diuretic regimen: Po furosemide 80mg  BID - Strict I&O, daily weights - Avoid nephrotoxic agents when able  Hypertension: Patient is on amlodipine 5mg  daily, furosemide 80mg  bid, hydralazine 75mg  tid and clonidine 0.3mg  weekly at baseline. Initially held in setting of permissive HTN. Home meds held briefly due to inability to tolerate PO meds. Am bp 185/69 - Restart home meds: amlodipine 5mg  daily, hydralazine 75mg  TID - IV labetalol PRN for bp >180   Chronic RLE DVT on coumadin: INR 2.0. No evidence of  bleed - Continue warfarin per pharmacy dosing - INR and CBC monitoring   Prior to Admission Living Arrangement: Home  Anticipated Discharge Location: SNF Barriers to Discharge: Insurance authorization Dispo: Anticipated discharge in approximately 1-2 day(s).   Mosetta Anis, MD  Internal Medicine, PGY-2 01/06/2020, 10:09 AM Pager: [phone] After 5pm on weekdays and 1pm on weekends: On Call pager [phone]

## 2020-01-06 NOTE — Progress Notes (Signed)
Position up in bed with help. HOB up to feed patient ate a few bites and drank some coffee. Bed back in low position. Stated wanted to breakfast when asked. Tolerated well still trying pocket some food, Got her drink a little.

## 2020-01-06 NOTE — Discharge Summary (Addendum)
Name: Chelsea Mcdaniel MRN: [id number] DOB: 08-27-1927 86 y.o. PCP: [name], MD  Date of Admission: 01/03/2020  5:59 PM Date of Discharge: 01/08/20 Attending Physician: Sid Falcon, MD  Discharge Diagnosis: 1. Acute right occipital CVA 2. Hypertension 3. Chronic Kidney Disease 4  Discharge Medications: Allergies as of 01/08/2020      Reactions   Diltiazem Other (See Comments)   Symptomatic bradaycardia   Diltiazem    unknown   Metoprolol Other (See Comments)   Symptomatic bradycardia   Metoprolol    unknown      Medication List    STOP taking these medications   lidocaine 5 % Commonly known as: Lidoderm     TAKE these medications   Accu-Chek Aviva Plus w/Device Kit Check blood sugar 1 time a day   Accu-Chek Softclix Lancet Dev Kit Use to check blood sugar one time a day   Accu-Chek Softclix Lancets lancets Use to check blood sugar 3 times daily. DIAG CODE E11.9   allopurinol 100 MG tablet Commonly known as: ZYLOPRIM TAKE 1/2 TABLET BY MOUTH EVERY OTHER DAY What changed: Another medication with the same name was removed. Continue taking this medication, and follow the directions you see here.   amLODipine 10 MG tablet Commonly known as: NORVASC Take 1 tablet (10 mg total) by mouth daily. What changed:   medication strength  how much to take  Another medication with the same name was removed. Continue taking this medication, and follow the directions you see here.   atorvastatin 40 MG tablet Commonly known as: LIPITOR Take 1 tablet (40 mg total) by mouth daily.   calcitRIOL 0.25 MCG capsule Commonly known as: ROCALTROL Take 0.25 mcg by mouth daily.   cloNIDine 0.3 mg/24hr patch Commonly known as: CATAPRES - Dosed in mg/24 hr APPLY 1 PATCH TO SKIN EVERY 7 DAYS   dorzolamide-timolol 22.3-6.8 MG/ML ophthalmic solution Commonly known as: COSOPT Place 1 drop into both eyes 2 (two) times daily. What changed: Another medication with the same  name was removed. Continue taking this medication, and follow the directions you see here.   furosemide 10 MG/ML solution Commonly known as: LASIX Take 8 mLs by mouth in the morning and at bedtime. What changed: Another medication with the same name was removed. Continue taking this medication, and follow the directions you see here.   GaviLAX 17 GM/SCOOP powder Generic drug: polyethylene glycol powder Take 17 grams by mouth 2 times daily What changed: Another medication with the same name was removed. Continue taking this medication, and follow the directions you see here.   glucose blood test strip Commonly known as: Accu-Chek Aviva Use as instructed   glucose blood test strip Commonly known as: Accu-Chek Aviva Plus USE 1 TIME DAILY TO CHECK BLOOD SUGAR   hydrALAZINE 25 MG tablet Commonly known as: APRESOLINE Take 3 tablets (75 mg total) by mouth 3 (three) times daily. What changed: Another medication with the same name was removed. Continue taking this medication, and follow the directions you see here.   Januvia 25 MG tablet Generic drug: sitaGLIPtin TAKE 1 TABLET BY MOUTH EVERY DAY What changed: Another medication with the same name was removed. Continue taking this medication, and follow the directions you see here.   latanoprost 0.005 % ophthalmic solution Commonly known as: XALATAN Place 1 drop into both eyes at bedtime.   Lumigan 0.01 % Soln Generic drug: bimatoprost Place 1 drop into both eyes at bedtime. What changed: Another medication with the  same name was removed. Continue taking this medication, and follow the directions you see here.   Melatonin 1 MG/ML Liqd TAKE 3.5 mls BY MOUTH AT BEDTIME AS NEEDED What changed: Another medication with the same name was removed. Continue taking this medication, and follow the directions you see here.   warfarin 5 MG tablet Commonly known as: COUMADIN Take as directed. If you are unsure how to take this medication, talk  to your nurse or doctor. Original instructions: Take 5 mg by mouth daily. What changed: Another medication with the same name was removed. Continue taking this medication, and follow the directions you see here.            Durable Medical Equipment  (From admission, onward)         Start     Ordered   01/08/20 1115  DME 3-in-1  Once        01/08/20 1116          Disposition and follow-up:   Chelsea Mcdaniel was discharged from [facility] in Stable condition.  At the hospital follow up visit please address:  1. Acute right occipital CVA - Thought to be embolic in setting of sub-therapeutic INR on warfarin - C/w conversation w/ family regarding anti-coagulation risk (family request continuing for now) - Ensure f/u with neurology  2. HTN - Titrate anti-hypertensive regimen based on bp  3. Chronic Kidney Disease 4 - Check bmp  2.  Labs / imaging needed at time of follow-up: cbc, bmp  3.  Pending labs/ test needing follow-up: N/A  Follow-up Appointments:  Follow-up Information    Lorenso Courier, MD. Schedule an appointment as soon as possible for a visit.   Specialty: Internal Medicine Contact information: [address] [place] [address] [phone]               Hospital Course by problem list:  1. Acute right occipital CVA: Chelsea Mcdaniel is a 86 yo F w/ PMH of dementia, prior CVA, T2Dm and HTN presenting to [facility] after she fell out of her wheelchair due to its wheels not being locked. She was also found to have subtherapeutic INR at the time. She had a CT head performed showing acute right occipital infarct confirmed by MRI. Family requested continuing anti-coagulation at this time. She was discharged to SNF with recommendation to f/u with PCP to discuss continued anti-coagulation use.  2. Hypertension: Noted to have elevated blood pressure with systolic above 190s. Initially allowed to have permissive hypertension in setting of acute  stroke. On hospital day 2, started on home anti-hypertensive regimen with mild improvement. Discharged w/ recommendation to follow up with PCP.  3. Chronic Kidney Disease 4: Noted to have BUN 38, Creatinine 3.33 on admission. Chart review showed baseline renal function with CKD 4. Observed during hospitalization without acute change.  Discharge Vitals:   BP (!) 186/76 (BP Location: Right Arm)   Pulse 68   Temp 99 F (37.2 C) (Oral)   Resp 18   Wt 56.9 kg   SpO2 98%   BMI 20.87 kg/m   Pertinent Labs, Studies, and Procedures:  CBC Latest Ref Rng & Units 01/08/2020 01/07/2020 01/06/2020  WBC 4.0 - 10.5 K/uL 7.1 8.1 5.8  Hemoglobin 12.0 - 15.0 g/dL 11.9(L) 11.5(L) 10.7(L)  Hematocrit 36 - 46 % 36.6 36.1 32.8(L)  Platelets 150 - 400 K/uL 261 286 249   BMP Latest Ref Rng & Units 01/08/2020 01/07/2020 01/06/2020  Glucose 70 - 99  mg/dL 134(H) 138(H) 121(H)  BUN 8 - 23 mg/dL 38(H) 38(H) 34(H)  Creatinine 0.44 - 1.00 mg/dL 3.34(H) 3.51(H) 3.14(H)  BUN/Creat Ratio 12 - 28 - - -  Sodium 135 - 145 mmol/L 141 140 138  Potassium 3.5 - 5.1 mmol/L 3.4(L) 3.7 3.5  Chloride 98 - 111 mmol/L 101 102 101  CO2 22 - 32 mmol/L '25 24 24  '$ Calcium 8.9 - 10.3 mg/dL 9.1 8.9 8.6(L)   MRI HEAD WITHOUT CONTRAST FINDINGS: Brain: Confluent restricted diffusion and cytotoxic edema throughout much of the right PCA territory including involvement of the right hippocampal complex, mild involvement of the right thalamus (series 5, image 74).  Adjacent chronic encephalomalacia in the right lateral occipital lobe, also the right superior frontal and parietal lobes. Chronic hemosiderin in those areas, but no acute hemorrhage is evident.  No significant intracranial mass effect. No midline shift. Patchy chronic encephalomalacia also in the posterior left temporal lobe. Superimposed confluent bilateral cerebral white matter T2 and FLAIR hyperintensity, with chronic lacunar infarcts in the left deep gray nuclei. No  other restricted diffusion. No evidence of mass lesion, ventriculomegaly, extra-axial collection. Pituitary within normal limits.  Vascular: Major intracranial vascular flow voids are preserved.  Skull and upper cervical spine: Mild to moderate for age upper cervical spine degeneration with evidence of mild spinal stenosis at the cervicomedullary junction and C3-C4.  Sinuses/Orbits: Chronic postoperative changes to the left globe. Paranasal sinuses and mastoids are stable and well pneumatized.  Other: Negative scalp and face soft tissues.  IMPRESSION: 1. Positive for acute and confluent Right PCA territory infarct, superimposed on Chronic right MCA and PCA encephalomalacia. No acute hemorrhage. No significant mass effect.  2. Mild chronic blood products. Chronic left MCA infarct, and fairly extensive chronic small vessel disease.  3. Skull base and cervical spine degeneration.  TRANSTHORACIC ECHOCARDIOGRAM IMPRESSIONS  1. Left ventricular ejection fraction, by estimation, is 60 to 65%. The  left ventricle has normal function. The left ventricle has no regional  wall motion abnormalities. There is moderate left ventricular hypertrophy.  Left ventricular diastolic  parameters are consistent with Grade II diastolic dysfunction  (pseudonormalization). Elevated left atrial pressure.  2. Right ventricular systolic function is normal. The right ventricular  size is normal.  3. The mitral valve is degenerative. No evidence of mitral valve  regurgitation. Mild mitral stenosis. The mean mitral valve gradient is 5.0  mmHg.  4. The aortic valve was not well visualized. Aortic valve regurgitation  is not visualized. Moderate aortic valve stenosis. Aortic valve mean  gradient measures 29.0 mmHg. Aortic valve Vmax measures 3.20 m/s.  5. The inferior vena cava is normal in size with <50% respiratory  variability, suggesting right atrial pressure of 8 mmHg.   FINDINGS  Left  Ventricle: Left ventricular ejection fraction, by estimation, is 60  to 65%. The left ventricle has normal function. The left ventricle has no  regional wall motion abnormalities. The left ventricular internal cavity  size was normal in size. There is  moderate left ventricular hypertrophy. Left ventricular diastolic  parameters are consistent with Grade II diastolic dysfunction  (pseudonormalization). Elevated left atrial pressure.   Right Ventricle: The right ventricular size is normal. No increase in  right ventricular wall thickness. Right ventricular systolic function is  normal.   Left Atrium: Left atrial size was normal in size.   Right Atrium: Right atrial size was normal in size.   Pericardium: There is no evidence of pericardial effusion.   Mitral Valve: The  mitral valve is degenerative in appearance. There is  moderate thickening of the mitral valve leaflet(s). There is moderate  calcification of the mitral valve leaflet(s). Normal mobility of the  mitral valve leaflets. Severe mitral annular  calcification. No evidence of mitral valve regurgitation. Mild mitral  valve stenosis. MV peak gradient, 10.2 mmHg. The mean mitral valve  gradient is 5.0 mmHg.   Tricuspid Valve: The tricuspid valve is normal in structure. Tricuspid  valve regurgitation is mild . No evidence of tricuspid stenosis.   Aortic Valve: The aortic valve was not well visualized. . There is  moderate thickening and moderate calcification of the aortic valve. Aortic  valve regurgitation is not visualized. Moderate aortic stenosis is  present. There is moderate thickening of the  aortic valve. There is moderate calcification of the aortic valve. Aortic  valve mean gradient measures 29.0 mmHg. Aortic valve peak gradient  measures 41.0 mmHg. Aortic valve area, by VTI measures 1.41 cm.   Pulmonic Valve: The pulmonic valve was normal in structure. Pulmonic valve  regurgitation is not visualized. No  evidence of pulmonic stenosis.   Aorta: The aortic root is normal in size and structure.   Venous: The inferior vena cava is normal in size with less than 50%  respiratory variability, suggesting right atrial pressure of 8 mmHg.   IAS/Shunts: No atrial level shunt detected by color flow Doppler.   Discharge Instructions: Discharge Instructions    Call MD for:  difficulty breathing, headache or visual disturbances   Complete by: As directed    Call MD for:  extreme fatigue   Complete by: As directed    Call MD for:  persistant dizziness or light-headedness   Complete by: As directed    Call MD for:  persistant nausea and vomiting   Complete by: As directed    Call MD for:  severe uncontrolled pain   Complete by: As directed    Diet - low sodium heart healthy   Complete by: As directed    Discharge instructions   Complete by: As directed    Chelsea Mcdaniel  You came to [place] with weakness. We have determined this was caused by a stroke. Here are our recommendations for you at discharge:  Please make sure to follow up with your family doctor Please start atorvastatin '40mg'$  daily Please take your home medications as prescribed  Thank you for choosing [facility]   Increase activity slowly   Complete by: As directed       Signed: Harvie Heck, MD 01/08/2020, 11:17 AM   Pager: [phone]

## 2020-01-06 NOTE — Progress Notes (Signed)
ANTICOAGULATION CONSULT NOTE -follow up  Pharmacy Consult for Warfarin  Indication: stroke, history of DVT  Allergies  Allergen Reactions  . Diltiazem Other (See Comments)    Symptomatic bradaycardia  . Diltiazem     unknown  . Metoprolol Other (See Comments)    Symptomatic bradycardia  . Metoprolol     unknown    Patient Measurements: Weight: 58.1 kg Height: 65 inches  Vital Signs: Temp: 98 F (36.7 C) (06/13 0337) Temp Source: Oral (06/12 2335) BP: 185/69 (06/13 0624) Pulse Rate: 76 (06/13 0624)  Labs: Recent Labs    01/03/20 1626 01/03/20 1626 01/04/20 0718 01/04/20 0718 01/05/20 0341 01/06/20 0321  HGB 11.1*   < > 10.5*   < > 11.1* 10.7*  HCT 34.4*   < > 32.5*  --  34.3* 32.8*  PLT 291   < > 269  --  295 249  APTT 28  --   --   --   --   --   LABPROT 18.3*   < > 19.8*  --  21.2* 22.4*  INR 1.6*   < > 1.8*  --  1.9* 2.0*  CREATININE 3.50*   < > 3.42*  --  3.20* 3.14*   < > = values in this interval not displayed.    Estimated Creatinine Clearance: 10.7 mL/min (A) (by C-G formula based on SCr of 3.14 mg/dL (H)).   Medical History: Past Medical History:  Diagnosis Date  . Anemia     normocytic anemia with baseline hemoglobin 10-11  . Blindness of left eye     likely related to stroke, left cataract removed from that eye with complications  . Calculus gallbladder and bile duct with cholecystitis with obstruction 01/2015  . Chronic kidney disease 2006    left renal artery stenosis with probable hemodynamic significance, kidneys are normal in morphology without focal lesions or hydronephrosis this is based but cannot A. of the abdomen with and without contrast done on the 31st 2006  . CKD (chronic kidney disease), stage III   . CVA (cerebral vascular accident) ([place]) 1990's  . CVA (cerebrovascular accident) [facility])  October 2007    CT of the head atrophy with multiple remote insults noted but no definite acute findings  . Diabetes mellitus without  complication ([place])   . Glaucoma   . Gout   . Hyperlipidemia   . Hypertension   . Personal history of DVT (deep vein thrombosis) 08/11/2010   BLE  . Stroke ([place])   . Type II diabetes mellitus [facility])     Assessment: 86 yr old female with medical hx of dementia, prior CVA, T2DM, HTN, chronic anemia, and DJD presented to Internal Medicine Clinic 01/03/20 for follow up after recent ED visit following a fall. At ED visit, CT scan showed new acute-subacute infarct in occipital lobe (compared to CT scan a few days prior) and no acute intracranial hemorrhage. Pt takes warfarin 5 mg PO daily at home for hx of DVT (she is followed at [facility] clinic; last visit was on 12/31/19, when INR was 1.3).   INR 1.6 on admission 6/10 (unclear when pt took last dose of warfarin PTA).  6/10 PM MRI positive for acute and confluence R PCA territory infarct, superimposed on chronic R MCA and PCA encephalomalacia; no acute hemorrhage or mass effect. Neuro noted high suspicion for PAF.  Neuro said to continue warfarin. INR is therapeutic at 2 today. No bleeding reported. Hgb stable at 10.7 and PLTC is within  normal limits/stable.    Goal of Therapy:  INR 2-3 Monitor platelets by anticoagulation protocol: Yes   Plan:  Warfarin 5 mg PO X 1 today Monitor daily INR, CBC Monitor for signs/symptoms of bleeding  Sherren Kerns, PharmD PGY1 Acute Care Pharmacy Resident 01/06/2020,8:11 AM

## 2020-01-07 LAB — CBC
HCT: 36.1 % (ref 36.0–46.0)
Hemoglobin: 11.5 g/dL — ABNORMAL LOW (ref 12.0–15.0)
MCH: 30.7 pg (ref 26.0–34.0)
MCHC: 31.9 g/dL (ref 30.0–36.0)
MCV: 96.3 fL (ref 80.0–100.0)
Platelets: 286 10*3/uL (ref 150–400)
RBC: 3.75 MIL/uL — ABNORMAL LOW (ref 3.87–5.11)
RDW: 14.7 % (ref 11.5–15.5)
WBC: 8.1 10*3/uL (ref 4.0–10.5)
nRBC: 0 % (ref 0.0–0.2)

## 2020-01-07 LAB — GLUCOSE, CAPILLARY
Glucose-Capillary: 114 mg/dL — ABNORMAL HIGH (ref 70–99)
Glucose-Capillary: 128 mg/dL — ABNORMAL HIGH (ref 70–99)
Glucose-Capillary: 152 mg/dL — ABNORMAL HIGH (ref 70–99)
Glucose-Capillary: 149 mg/dL — ABNORMAL HIGH (ref 70–99)
Glucose-Capillary: 165 mg/dL — ABNORMAL HIGH (ref 70–99)
Glucose-Capillary: 111 mg/dL — ABNORMAL HIGH (ref 70–99)

## 2020-01-07 LAB — RENAL FUNCTION PANEL
Albumin: 3 g/dL — ABNORMAL LOW (ref 3.5–5.0)
Anion gap: 14 (ref 5–15)
BUN: 38 mg/dL — ABNORMAL HIGH (ref 8–23)
CO2: 24 mmol/L (ref 22–32)
Calcium: 8.9 mg/dL (ref 8.9–10.3)
Chloride: 102 mmol/L (ref 98–111)
Creatinine, Ser: 3.51 mg/dL — ABNORMAL HIGH (ref 0.44–1.00)
GFR calc Af Amer: 13 mL/min — ABNORMAL LOW (ref >60)
GFR calc non Af Amer: 11 mL/min — ABNORMAL LOW (ref >60)
Glucose, Bld: 138 mg/dL — ABNORMAL HIGH (ref 70–99)
Phosphorus: 4.5 mg/dL (ref 2.5–4.6)
Potassium: 3.7 mmol/L (ref 3.5–5.1)
Sodium: 140 mmol/L (ref 135–145)

## 2020-01-07 LAB — PROTIME-INR
INR: 2.4 — ABNORMAL HIGH (ref 0.8–1.2)
Prothrombin Time: 25.5 seconds — ABNORMAL HIGH (ref 11.4–15.2)

## 2020-01-07 MED ORDER — WARFARIN SODIUM 5 MG PO TABS
5.0000 mg | ORAL_TABLET | Freq: Once | ORAL | Status: AC
  Administered 2020-01-07: 5 mg via ORAL
  Filled 2020-01-07 (×2): qty 1

## 2020-01-07 NOTE — Progress Notes (Signed)
ANTICOAGULATION CONSULT NOTE -follow up  Pharmacy Consult for Warfarin  Indication: stroke, history of DVT  Allergies  Allergen Reactions  . Diltiazem Other (See Comments)    Symptomatic bradaycardia  . Diltiazem     unknown  . Metoprolol Other (See Comments)    Symptomatic bradycardia  . Metoprolol     unknown    Patient Measurements: Weight: 58.1 kg Height: 65 inches  Vital Signs: Temp: 99.1 F (37.3 C) (06/14 0749) Temp Source: Oral (06/14 0749) BP: 154/67 (06/14 0749) Pulse Rate: 74 (06/14 0749)  Labs: Recent Labs    01/05/20 0341 01/05/20 0341 01/06/20 0321 01/07/20 0930  HGB 11.1*   < > 10.7* 11.5*  HCT 34.3*  --  32.8* 36.1  PLT 295  --  249 286  LABPROT 21.2*  --  22.4* 25.5*  INR 1.9*  --  2.0* 2.4*  CREATININE 3.20*  --  3.14*  --    < > = values in this interval not displayed.    Estimated Creatinine Clearance: 10.7 mL/min (A) (by C-G formula based on SCr of 3.14 mg/dL (H)).   Medical History: Past Medical History:  Diagnosis Date  . Anemia     normocytic anemia with baseline hemoglobin 10-11  . Blindness of left eye     likely related to stroke, left cataract removed from that eye with complications  . Calculus gallbladder and bile duct with cholecystitis with obstruction 01/2015  . Chronic kidney disease 2006    left renal artery stenosis with probable hemodynamic significance, kidneys are normal in morphology without focal lesions or hydronephrosis this is based but cannot A. of the abdomen with and without contrast done on the 31st 2006  . CKD (chronic kidney disease), stage III   . CVA (cerebral vascular accident) ([place]) 1990's  . CVA (cerebrovascular accident) [facility])  October 2007    CT of the head atrophy with multiple remote insults noted but no definite acute findings  . Diabetes mellitus without complication ([place])   . Glaucoma   . Gout   . Hyperlipidemia   . Hypertension   . Personal history of DVT (deep vein thrombosis) 08/11/2010    BLE  . Stroke ([place])   . Type II diabetes mellitus [facility])     Assessment: 86 yr old female with medical hx of dementia, prior CVA, T2DM, HTN, chronic anemia, and DJD presented to Internal Medicine Clinic 01/03/20 for follow up after recent ED visit following a fall. At ED visit, CT scan showed new acute-subacute infarct in occipital lobe (compared to CT scan a few days prior) and no acute intracranial hemorrhage. Pt takes warfarin 5 mg PO daily at home for hx of DVT (she is followed at [facility] clinic; last visit was on 12/31/19, when INR was 1.3).   INR 1.6 on admission 6/10 (unclear when pt took last dose of warfarin PTA).  6/10 PM MRI positive for acute and confluence R PCA territory infarct, superimposed on chronic R MCA and PCA encephalomalacia; no acute hemorrhage or mass effect. Neuro noted high suspicion for PAF.  Neuro said to continue warfarin.  INR is therapeutic at 2.4 today. No bleeding reported. Hgb stable at 11.5 and PLTC is within normal limits/stable.    Goal of Therapy:  INR 2-3 Monitor platelets by anticoagulation protocol: Yes   Plan:  Warfarin 5 mg PO X 1 today Monitor daily INR, CBC Monitor for signs/symptoms of bleeding  Neeya Prigmore A. Levada Dy, PharmD, BCPS, [facility] Clinical Pharmacist [place] Please  utilize Amion for appropriate phone number to reach the unit pharmacist ([place])   01/07/2020,11:08 AM

## 2020-01-07 NOTE — Progress Notes (Addendum)
Physical Therapy Treatment Patient Details Name: Chelsea Mcdaniel MRN: [id number] DOB: 1928/04/22 Today's Date: 01/07/2020    History of Present Illness Pt is a 86 y.o. female with PMH of HTN, DM type II, carpal tunnel syndrome, OA of hands, dementia, CKD, hyperlipidemia, gout, DVT, R MCA infarct with L hemiparesis, L eye blindness. Presents to the emergency department after a fall from her wheelchair 2 days ago. She was taken to the emergency department and CT head was performed which demonstrated a subacute infarct in the right occipital lobe.    PT Comments     Pt was seen for mobility to side of bed and then to get to the chair.  Pt is demonstrating a limited ability to help stand up but was able to be assisted to chair aside the bed.  Pt was repositioned with elevated legs and to increase her postural support with pillows in chair.  Follow acutely to increase sitting and standing balance, LE strength and improve endurance for OOB activity.  Weak L hand grip required PT to stand her with direct support instead of w walker.  Follow Up Recommendations  SNF     Equipment Recommendations  None recommended by PT    Recommendations for Other Services       Precautions / Restrictions Precautions Precautions: Fall Precaution Comments: blind L eye  Restrictions Weight Bearing Restrictions: No    Mobility  Bed Mobility Overal bed mobility: Needs Assistance Bed Mobility: Supine to Sit     Supine to sit: Max assist        Transfers Overall transfer level: Needs assistance Equipment used: 1 person hand held assist Transfers: Sit to/from Stand Sit to Stand: Total assist         General transfer comment: very limited ability to power up   Ambulation/Gait                 Stairs             Wheelchair Mobility    Modified Rankin (Stroke Patients Only)       Balance Overall balance assessment: Needs assistance Sitting-balance support: Feet  supported Sitting balance-Leahy Scale: Fair       Standing balance-Leahy Scale: Poor                              Cognition Arousal/Alertness: Awake/alert Behavior During Therapy: WFL for tasks assessed/performed Overall Cognitive Status: No family/caregiver present to determine baseline cognitive functioning                                 General Comments: dementia      Exercises      General Comments        Pertinent Vitals/Pain Pain Assessment: Faces Faces Pain Scale: No hurt    Home Living                      Prior Function            PT Goals (current goals can now be found in the care plan section) Acute Rehab PT Goals Patient Stated Goal: none stated Progress towards PT goals: Progressing toward goals    Frequency    Min 4X/week      PT Plan Current plan remains appropriate    Co-evaluation  AM-PAC PT "6 Clicks" Mobility   Outcome Measure  Help needed turning from your back to your side while in a flat bed without using bedrails?: A Lot Help needed moving from lying on your back to sitting on the side of a flat bed without using bedrails?: A Lot Help needed moving to and from a bed to a chair (including a wheelchair)?: A Lot Help needed standing up from a chair using your arms (e.g., wheelchair or bedside chair)?: A Lot Help needed to walk in hospital room?: A Lot Help needed climbing 3-5 steps with a railing? : Total 6 Click Score: 11    End of Session Equipment Utilized During Treatment: Gait belt Activity Tolerance: Patient tolerated treatment well Patient left: in chair;with call bell/phone within reach;with chair alarm set Nurse Communication: Mobility status PT Visit Diagnosis: Unsteadiness on feet (R26.81);Other abnormalities of gait and mobility (R26.89);History of falling (Z91.81);Muscle weakness (generalized) (M62.81);Hemiplegia and hemiparesis;Difficulty in walking, not  elsewhere classified (R26.2) Hemiplegia - Right/Left: Left Hemiplegia - dominant/non-dominant: Non-dominant Hemiplegia - caused by: Cerebral infarction     Time: 1027-2536 PT Time Calculation (min) (ACUTE ONLY): 33 min  Charges:  $Therapeutic Activity: 23-37 mins                    Ramond Dial 01/07/2020, 10:09 PM  Mee Hives, PT MS Acute Rehab Dept. Number: [place]

## 2020-01-07 NOTE — Progress Notes (Signed)
Subjective: HD 4 No acute overnight events reported.  [name] was evaluated at bedside this morning. She is eating breakfast with two person assistance. She is oriented to self and place but states age as 38 and is unaware of her most recent stroke.   Discussed w/Chelsea Mcdaniel, her primary caregiver, regarding patient's status and recommendation for SNF. She expresses understanding and is agreeable with the plan. Also followed up regarding anti-coagulation. She notes that she would like to continue with current anti-coagulation at this time. All questions and concerns addressed.   Objective:  Vital signs in last 24 hours: Vitals:   01/06/20 2140 01/06/20 2344 01/07/20 0334 01/07/20 0450  BP: (!) 159/67 (!) 167/66 (!) 187/66 (!) 177/64  Pulse: 72 71 74 70  Resp:  18 20   Temp:  98.7 F (37.1 C) 98.6 F (37 C)   TempSrc:  Oral Oral   SpO2:  98% 99%   Weight:   59.7 kg    CBC Latest Ref Rng & Units 01/06/2020 01/05/2020 01/04/2020  WBC 4.0 - 10.5 K/uL 5.8 5.9 5.9  Hemoglobin 12.0 - 15.0 g/dL 10.7(L) 11.1(L) 10.5(L)  Hematocrit 36 - 46 % 32.8(L) 34.3(L) 32.5(L)  Platelets 150 - 400 K/uL 249 295 269   BMP Latest Ref Rng & Units 01/06/2020 01/05/2020 01/04/2020  Glucose 70 - 99 mg/dL 121(H) 118(H) 141(H)  BUN 8 - 23 mg/dL 34(H) 35(H) 37(H)  Creatinine 0.44 - 1.00 mg/dL 3.14(H) 3.20(H) 3.42(H)  BUN/Creat Ratio 12 - 28 - - -  Sodium 135 - 145 mmol/L 138 137 136  Potassium 3.5 - 5.1 mmol/L 3.5 3.1(L) 3.7  Chloride 98 - 111 mmol/L 101 101 101  CO2 22 - 32 mmol/L 24 25 24   Calcium 8.9 - 10.3 mg/dL 8.6(L) 8.9 8.7(L)   Physical Exam  Constitutional: Appears well-developed and well-nourished. No distress.  Head: Normocephalic and atraumatic - well healing scab on bridge of nose   Eyes: Conjunctivae are normal. EOMI  Cardiovascular: Normal rate, regular rhythm grade 3/6 systolic murmur at left sternal border Respiratory: Effort normal and breath sounds normal. No  respiratory distress. No wheezes, rales or rhonchi  GI: Soft. Bowel sounds are normal. No distension. There is no tenderness.  Neurological: AO to self and place. Notes age as 66, not oriented to situation; following commands appropriately; LUE 3/5, 5/5 in RUE Skin: Warm and dry, no erythema or lesions noted   Assessment/Plan:  Active Problems:   Occipital stroke [facility])  [name] is a 86 year old female with PMHx of dementia, prior CVA, type II DM, hypertension admitted for acute right PCA infarct.   Acute right occipital CVA: Patient presented with left sided hemiparesis, found to have right PCA territory infarct on MRI, likely cardioembolic. Stroke work up with carotid US and Echo without source of embolus. Patient is on warfarin therapy. At this time, will continue due to easy reversibility given her age and recurrent falls.  - Continue warfarin dosing per pharmacy, goal INR 2-3 - Continue atorvastatin 80mg  daily - Medically stable for discharge to SNF when available   CKD4: Patient with history of CKD4, not good candidate for dialysis due to age. She is on lasix 80mg  bid as outpatient for history of hypervolemia. She is currently continued on her home regimen. Noted to have 1.4L UOP and down 1.5kg from admission. Euvolemic on examination.  - Continue lasix 80mg  bid - Continue to monitor renal function - Strict I&O, daily weights - Avoid  nephrotoxic agents   Hypertension: Patient is on amlodipine 5mg  daily and hydralazine 75mg  tid and furosemide 80mg  bid. SBP 150-180s.  - Continue current regimen - IV labetalol 5mg  prn for SBP >180  Chronic RLE DVT on coumadin:  Stable. Continue warfarin dosing per pharmacy.   FEN/GI: Diet: Dysphagia 2 Fluids: None Electrolytes: Monitor and replete prn   DVT Prophylaxis: Warfarin Code: DNR/DNI  Prior to Admission Living Arrangement: Home Anticipated Discharge Location: SNF Barriers to Discharge: pending SNF placement/insurance  auth Dispo: Anticipated discharge in approximately 0-1 day(s).   Harvie Heck, MD  Internal Medicine, PGY-1 01/07/2020, 6:47 AM Pager: [phone] After 5pm on weekdays and 1pm on weekends: On Call pager [phone]

## 2020-01-07 NOTE — Progress Notes (Signed)
Internal Medicine Clinic Attending  Case discussed with Dr. Alexander at the time of the visit.  We reviewed the resident's history and exam and pertinent patient test results.  I agree with the assessment, diagnosis, and plan of care documented in the resident's note.  

## 2020-01-08 ENCOUNTER — Encounter: Payer: Medicare Other | Admitting: Internal Medicine

## 2020-01-08 DIAGNOSIS — R5381 Other malaise: Secondary | ICD-10-CM | POA: Diagnosis not present

## 2020-01-08 DIAGNOSIS — E43 Unspecified severe protein-calorie malnutrition: Secondary | ICD-10-CM | POA: Diagnosis not present

## 2020-01-08 DIAGNOSIS — R262 Difficulty in walking, not elsewhere classified: Secondary | ICD-10-CM | POA: Diagnosis not present

## 2020-01-08 DIAGNOSIS — R6889 Other general symptoms and signs: Secondary | ICD-10-CM | POA: Diagnosis not present

## 2020-01-08 DIAGNOSIS — R279 Unspecified lack of coordination: Secondary | ICD-10-CM | POA: Diagnosis not present

## 2020-01-08 DIAGNOSIS — Z7401 Bed confinement status: Secondary | ICD-10-CM | POA: Diagnosis not present

## 2020-01-08 DIAGNOSIS — R1319 Other dysphagia: Secondary | ICD-10-CM | POA: Diagnosis not present

## 2020-01-08 DIAGNOSIS — H04123 Dry eye syndrome of bilateral lacrimal glands: Secondary | ICD-10-CM | POA: Diagnosis not present

## 2020-01-08 DIAGNOSIS — D649 Anemia, unspecified: Secondary | ICD-10-CM | POA: Diagnosis not present

## 2020-01-08 DIAGNOSIS — Z5181 Encounter for therapeutic drug level monitoring: Secondary | ICD-10-CM | POA: Diagnosis not present

## 2020-01-08 DIAGNOSIS — I69954 Hemiplegia and hemiparesis following unspecified cerebrovascular disease affecting left non-dominant side: Secondary | ICD-10-CM | POA: Diagnosis not present

## 2020-01-08 DIAGNOSIS — I639 Cerebral infarction, unspecified: Secondary | ICD-10-CM | POA: Diagnosis not present

## 2020-01-08 DIAGNOSIS — E785 Hyperlipidemia, unspecified: Secondary | ICD-10-CM | POA: Diagnosis not present

## 2020-01-08 DIAGNOSIS — Z7901 Long term (current) use of anticoagulants: Secondary | ICD-10-CM | POA: Diagnosis not present

## 2020-01-08 DIAGNOSIS — G4700 Insomnia, unspecified: Secondary | ICD-10-CM | POA: Diagnosis not present

## 2020-01-08 DIAGNOSIS — M6281 Muscle weakness (generalized): Secondary | ICD-10-CM | POA: Diagnosis not present

## 2020-01-08 DIAGNOSIS — R2681 Unsteadiness on feet: Secondary | ICD-10-CM | POA: Diagnosis not present

## 2020-01-08 DIAGNOSIS — E119 Type 2 diabetes mellitus without complications: Secondary | ICD-10-CM | POA: Diagnosis not present

## 2020-01-08 DIAGNOSIS — R1312 Dysphagia, oropharyngeal phase: Secondary | ICD-10-CM | POA: Diagnosis not present

## 2020-01-08 DIAGNOSIS — I129 Hypertensive chronic kidney disease with stage 1 through stage 4 chronic kidney disease, or unspecified chronic kidney disease: Secondary | ICD-10-CM | POA: Diagnosis not present

## 2020-01-08 DIAGNOSIS — I110 Hypertensive heart disease with heart failure: Secondary | ICD-10-CM | POA: Diagnosis not present

## 2020-01-08 DIAGNOSIS — Z743 Need for continuous supervision: Secondary | ICD-10-CM | POA: Diagnosis not present

## 2020-01-08 DIAGNOSIS — H409 Unspecified glaucoma: Secondary | ICD-10-CM | POA: Diagnosis not present

## 2020-01-08 DIAGNOSIS — L720 Epidermal cyst: Secondary | ICD-10-CM | POA: Diagnosis not present

## 2020-01-08 DIAGNOSIS — R488 Other symbolic dysfunctions: Secondary | ICD-10-CM | POA: Diagnosis not present

## 2020-01-08 DIAGNOSIS — M109 Gout, unspecified: Secondary | ICD-10-CM | POA: Diagnosis not present

## 2020-01-08 DIAGNOSIS — N184 Chronic kidney disease, stage 4 (severe): Secondary | ICD-10-CM | POA: Diagnosis not present

## 2020-01-08 DIAGNOSIS — I10 Essential (primary) hypertension: Secondary | ICD-10-CM | POA: Diagnosis not present

## 2020-01-08 DIAGNOSIS — U071 COVID-19: Secondary | ICD-10-CM | POA: Diagnosis not present

## 2020-01-08 DIAGNOSIS — M2550 Pain in unspecified joint: Secondary | ICD-10-CM | POA: Diagnosis not present

## 2020-01-08 DIAGNOSIS — I82501 Chronic embolism and thrombosis of unspecified deep veins of right lower extremity: Secondary | ICD-10-CM | POA: Diagnosis not present

## 2020-01-08 LAB — CBC
HCT: 36.6 % (ref 36.0–46.0)
Hemoglobin: 11.9 g/dL — ABNORMAL LOW (ref 12.0–15.0)
MCH: 30.9 pg (ref 26.0–34.0)
MCHC: 32.5 g/dL (ref 30.0–36.0)
MCV: 95.1 fL (ref 80.0–100.0)
Platelets: 261 10*3/uL (ref 150–400)
RBC: 3.85 MIL/uL — ABNORMAL LOW (ref 3.87–5.11)
RDW: 14.5 % (ref 11.5–15.5)
WBC: 7.1 10*3/uL (ref 4.0–10.5)
nRBC: 0 % (ref 0.0–0.2)

## 2020-01-08 LAB — RENAL FUNCTION PANEL
Albumin: 3.1 g/dL — ABNORMAL LOW (ref 3.5–5.0)
Anion gap: 15 (ref 5–15)
BUN: 38 mg/dL — ABNORMAL HIGH (ref 8–23)
CO2: 25 mmol/L (ref 22–32)
Calcium: 9.1 mg/dL (ref 8.9–10.3)
Chloride: 101 mmol/L (ref 98–111)
Creatinine, Ser: 3.34 mg/dL — ABNORMAL HIGH (ref 0.44–1.00)
GFR calc Af Amer: 13 mL/min — ABNORMAL LOW (ref >60)
GFR calc non Af Amer: 12 mL/min — ABNORMAL LOW (ref >60)
Glucose, Bld: 134 mg/dL — ABNORMAL HIGH (ref 70–99)
Phosphorus: 4 mg/dL (ref 2.5–4.6)
Potassium: 3.4 mmol/L — ABNORMAL LOW (ref 3.5–5.1)
Sodium: 141 mmol/L (ref 135–145)

## 2020-01-08 LAB — PROTIME-INR
INR: 2.5 — ABNORMAL HIGH (ref 0.8–1.2)
Prothrombin Time: 26.3 seconds — ABNORMAL HIGH (ref 11.4–15.2)

## 2020-01-08 LAB — GLUCOSE, CAPILLARY
Glucose-Capillary: 138 mg/dL — ABNORMAL HIGH (ref 70–99)
Glucose-Capillary: 143 mg/dL — ABNORMAL HIGH (ref 70–99)

## 2020-01-08 MED ORDER — WARFARIN SODIUM 5 MG PO TABS: 5.0000 mg | ORAL_TABLET | Freq: Once | ORAL | Status: DC

## 2020-01-08 MED ORDER — POTASSIUM CHLORIDE 20 MEQ PO PACK
40.0000 meq | PACK | Freq: Two times a day (BID) | ORAL | Status: DC
  Filled 2020-01-08: qty 2

## 2020-01-08 MED ORDER — ATORVASTATIN CALCIUM 40 MG PO TABS: 40.0000 mg | ORAL_TABLET | Freq: Every day | ORAL | Status: AC

## 2020-01-08 MED ORDER — AMLODIPINE BESYLATE 10 MG PO TABS: 10.0000 mg | ORAL_TABLET | Freq: Every day | ORAL | Status: AC

## 2020-01-08 NOTE — Progress Notes (Signed)
ANTICOAGULATION CONSULT NOTE -follow up  Pharmacy Consult for Warfarin  Indication: stroke, history of DVT  Allergies  Allergen Reactions  . Diltiazem Other (See Comments)    Symptomatic bradaycardia  . Diltiazem     unknown  . Metoprolol Other (See Comments)    Symptomatic bradycardia  . Metoprolol     unknown    Patient Measurements: Weight: 58.1 kg Height: 65 inches  Vital Signs: Temp: 99.3 F (37.4 C) (06/15 0347) Temp Source: Oral (06/15 0347) BP: 169/78 (06/15 0347) Pulse Rate: 81 (06/15 0347)  Labs: Recent Labs    01/06/20 0321 01/06/20 0321 01/07/20 0930 01/08/20 0425  HGB 10.7*   < > 11.5* 11.9*  HCT 32.8*  --  36.1 36.6  PLT 249  --  286 261  LABPROT 22.4*  --  25.5* 26.3*  INR 2.0*  --  2.4* 2.5*  CREATININE 3.14*  --  3.51* 3.34*   < > = values in this interval not displayed.    Estimated Creatinine Clearance: 10.1 mL/min (A) (by C-G formula based on SCr of 3.34 mg/dL (H)).   Medical History: Past Medical History:  Diagnosis Date  . Anemia     normocytic anemia with baseline hemoglobin 10-11  . Blindness of left eye     likely related to stroke, left cataract removed from that eye with complications  . Calculus gallbladder and bile duct with cholecystitis with obstruction 01/2015  . Chronic kidney disease 2006    left renal artery stenosis with probable hemodynamic significance, kidneys are normal in morphology without focal lesions or hydronephrosis this is based but cannot A. of the abdomen with and without contrast done on the 31st 2006  . CKD (chronic kidney disease), stage III   . CVA (cerebral vascular accident) ([place]) 1990's  . CVA (cerebrovascular accident) [facility])  October 2007    CT of the head atrophy with multiple remote insults noted but no definite acute findings  . Diabetes mellitus without complication ([place])   . Glaucoma   . Gout   . Hyperlipidemia   . Hypertension   . Personal history of DVT (deep vein thrombosis) 08/11/2010    BLE  . Stroke ([place])   . Type II diabetes mellitus [facility])     Assessment: 86 yr old female with medical hx of dementia, prior CVA, T2DM, HTN, chronic anemia, and DJD presented to Internal Medicine Clinic 01/03/20 for follow up after recent ED visit following a fall. At ED visit, CT scan showed new acute-subacute infarct in occipital lobe (compared to CT scan a few days prior) and no acute intracranial hemorrhage. Pt takes warfarin 5 mg PO daily at home for hx of DVT (she is followed at [facility] clinic; last visit was on 12/31/19, when INR was 1.3).   INR 1.6 on admission 6/10 (unclear when pt took last dose of warfarin PTA).  6/10 PM MRI positive for acute and confluence R PCA territory infarct, superimposed on chronic R MCA and PCA encephalomalacia; no acute hemorrhage or mass effect. Neuro noted high suspicion for PAF.  Neuro said to continue warfarin.  INR is therapeutic at 2.5 today. No bleeding reported. Hgb stable at 11.9 and PLTC is within normal limits/stable.    Goal of Therapy:  INR 2-3 Monitor platelets by anticoagulation protocol: Yes   Plan:  Warfarin 5 mg PO X 1 today Monitor daily INR, CBC Monitor for signs/symptoms of bleeding  Waleed Dettman A. Levada Dy, PharmD, BCPS, FNKF Clinical Pharmacist [place] Please utilize Amion  for appropriate phone number to reach the unit pharmacist ([place])   01/08/2020,7:56 AM

## 2020-01-08 NOTE — Progress Notes (Signed)
Occupational Therapy Treatment Patient Details Name: Chelsea Mcdaniel MRN: [id number] DOB: [birth year] Today's Date: 01/08/2020    History of present illness Pt is a 86 y.o. female with PMH of HTN, DM type II, carpal tunnel syndrome, OA of hands, dementia, CKD, hyperlipidemia, gout, DVT, R MCA infarct with L hemiparesis, L eye blindness. Presents to the emergency department after a fall from her wheelchair 2 days ago. She was taken to the emergency department and CT head was performed which demonstrated a subacute infarct in the right occipital lobe.   OT comments  Pt seen in conjunction with PT to optimize pts activity tolerance and progress pt towards functional mobility goals. Pt continues to present with impaired balance, decreased activity tolerance, visual deficits, LUE weakness and decreased ROM and generalized weakness. Overall, pt requires MAX A +2 for all functional mobility as pt presents with decreased initiation. Pt completed stand pivot to recliner with MAX A +2 and HHA. Pt currently requires total A for all ADLs. Agree with DC plan below, will follow acutely per POC.   Follow Up Recommendations  Home health OT;Supervision/Assistance - 24 hour;Other (comment) (if doesn't have 24/7 A will need SNF)    Equipment Recommendations  3 in 1 bedside commode    Recommendations for Other Services Other (comment) (palliative care)    Precautions / Restrictions Precautions Precautions: Fall Precaution Comments: blind L eye  Restrictions Weight Bearing Restrictions: No       Mobility Bed Mobility Overal bed mobility: Needs Assistance Bed Mobility: Rolling;Sidelying to Sit Rolling: Max assist Sidelying to sit: Max assist;+2 for physical assistance;+2 for safety/equipment Supine to sit: Max assist     General bed mobility comments: pt with poor initiation needing MAX A +2 to complete sidelying>sit  Transfers Overall transfer level: Needs assistance Equipment used: Rolling walker  (2 wheeled) Transfers: Sit to/from [facility] Sit to Stand: Max assist;+2 physical assistance Stand pivot transfers: Max assist;+2 physical assistance       General transfer comment: pt with decreased initiation needing MAX A +2 to power into standing and initiate pivotal steps to recliner    Balance Overall balance assessment: Needs assistance Sitting-balance support: Feet supported;Single extremity supported Sitting balance-Leahy Scale: Fair Sitting balance - Comments: pt able to sit EOB unsupported with close supervision, however pt presented with R lateral lean once in recliner Postural control: Right lateral lean (in chair only) Standing balance support: Bilateral upper extremity supported Standing balance-Leahy Scale: Poor Standing balance comment: reliant UE and external  assistance for standing balance                           ADL either performed or assessed with clinical judgement   ADL Overall ADL's : Needs assistance/impaired             Lower Body Bathing: Total assistance;Bed level Lower Body Bathing Details (indicate cue type and reason): simulated via pericare         Toilet Transfer: +2 for physical assistance;+2 for safety/equipment;Stand-pivot;Maximal assistance Toilet Transfer Details (indicate cue type and reason): simulated to recliner  Toileting- Clothing Manipulation and Hygiene: Total assistance;Bed level       Functional mobility during ADLs: +2 for physical assistance;+2 for safety/equipment;Maximal assistance General ADL Comments: pt requries total A for ADLS and MAXA +2 for mobility this session.     Vision Baseline Vision/History: Legally blind (L eye) Patient Visual Report: Other (comment) (pt reports red roses are "black" however difficulty  to assess vision secondary to cog deficits) Vision Assessment?: Vision impaired- to be further tested in functional context Additional Comments: R gaze perference; unable  to track past midline   Perception     Praxis      Cognition Arousal/Alertness: Awake/alert Behavior During Therapy: WFL for tasks assessed/performed Overall Cognitive Status: History of cognitive impairments - at baseline                                 General Comments: pt very soft spoken; dementia at baseline        Exercises     Shoulder Instructions       General Comments pt noted to be soiled during session; NT and RN present to assist with pericare    Pertinent Vitals/ Pain       Pain Assessment: Faces Faces Pain Scale: No hurt  Home Living                                          Prior Functioning/Environment              Frequency  Min 2X/week        Progress Toward Goals  OT Goals(current goals can now be found in the care plan section)  Progress towards OT goals: Progressing toward goals  Acute Rehab OT Goals Patient Stated Goal: none stated OT Goal Formulation: With patient Time For Goal Achievement: 01/18/20 Potential to Achieve Goals: Good  Plan Discharge plan remains appropriate;Frequency remains appropriate    Co-evaluation    PT/OT/SLP Co-Evaluation/Treatment: Yes Reason for Co-Treatment: For patient/therapist safety;To address functional/ADL transfers;Necessary to address cognition/behavior during functional activity   OT goals addressed during session: ADL's and self-care      AM-PAC OT "6 Clicks" Daily Activity     Outcome Measure   Help from another person eating meals?: A Lot Help from another person taking care of personal grooming?: A Lot Help from another person toileting, which includes using toliet, bedpan, or urinal?: Total Help from another person bathing (including washing, rinsing, drying)?: A Lot Help from another person to put on and taking off regular upper body clothing?: A Lot Help from another person to put on and taking off regular lower body clothing?: Total 6 Click  Score: 10    End of Session Equipment Utilized During Treatment: Gait belt  OT Visit Diagnosis: Other abnormalities of gait and mobility (R26.89);Muscle weakness (generalized) (M62.81);Other symptoms and signs involving cognitive function   Activity Tolerance Patient tolerated treatment well   Patient Left in chair;with call bell/phone within reach;with chair alarm set   Nurse Communication Mobility status        Time: 5400-8676 OT Time Calculation (min): 37 min  Charges: OT General Charges $OT Visit: 1 Visit OT Treatments $Self Care/Home Management : 8-22 mins  Lanier Clam., COTA/L Acute Rehabilitation Services [phone]    Ihor Gully 01/08/2020, 10:53 AM

## 2020-01-08 NOTE — Progress Notes (Signed)
Physical Therapy Treatment Patient Details Name: Chelsea Mcdaniel MRN: [id number] DOB: [birth year] Today's Date: 01/08/2020    History of Present Illness Pt is a 86 y.o. female with PMH of HTN, DM type II, carpal tunnel syndrome, OA of hands, dementia, CKD, hyperlipidemia, gout, DVT, R MCA infarct with L hemiparesis, L eye blindness. Presents to the emergency department after a fall from her wheelchair 2 days ago. She was taken to the emergency department and CT head was performed which demonstrated a subacute infarct in the right occipital lobe.    PT Comments    Patient seen for mobility progression. Pt in bed upon arrival and agreeable to participate in therapy. Pt requires max A for bed mobility and max A +2 for OOB mobility. Pt able to stand pivot transfer bed to recliner. Continue to progress as tolerated with anticipated d/c to SNF for further skilled PT services.       Follow Up Recommendations  SNF     Equipment Recommendations  None recommended by PT    Recommendations for Other Services       Precautions / Restrictions Precautions Precautions: Fall Precaution Comments: blind L eye  Restrictions Weight Bearing Restrictions: No    Mobility  Bed Mobility Overal bed mobility: Needs Assistance Bed Mobility: Rolling;Sidelying to Sit Rolling: Max assist Sidelying to sit: Max assist;+2 for physical assistance;+2 for safety/equipment Supine to sit: Max assist     General bed mobility comments: assist for all bed mobility mobility with hand over hand assist and multimodal cues needed  Transfers Overall transfer level: Needs assistance Equipment used: Rolling walker (2 wheeled) Transfers: Sit to/from [facility] Sit to Stand: Max assist;+2 physical assistance Stand pivot transfers: Max assist;+2 physical assistance       General transfer comment: bed pad used to facilitate hip extension; +2 assist for power up into standing and then to weight  shift/balance and mobilize R LE while pivoting bed to chair; pt assisted in mobility once initiated by therapists  Ambulation/Gait                 Stairs             Wheelchair Mobility    Modified Rankin (Stroke Patients Only) Modified Rankin (Stroke Patients Only) Pre-Morbid Rankin Score: No symptoms Modified Rankin: Severe disability     Balance Overall balance assessment: Needs assistance Sitting-balance support: Feet supported;Single extremity supported Sitting balance-Leahy Scale: Fair Sitting balance - Comments: pt able to sit EOB unsupported with close supervision, however pt presented with R lateral lean once in recliner Postural control: Right lateral lean (in chair only) Standing balance support: Bilateral upper extremity supported Standing balance-Leahy Scale: Zero Standing balance comment: reliant UE and external  assistance for standing balance                            Cognition Arousal/Alertness: Awake/alert Behavior During Therapy: WFL for tasks assessed/performed Overall Cognitive Status: History of cognitive impairments - at baseline                                 General Comments: pt very soft spoken; dementia at baseline      Exercises      General Comments General comments (skin integrity, edema, etc.): pt noted to be soiled during session; NT and RN present to assist with pericare      Pertinent  Vitals/Pain Pain Assessment: Faces Faces Pain Scale: No hurt    Home Living                      Prior Function            PT Goals (current goals can now be found in the care plan section) Acute Rehab PT Goals Patient Stated Goal: none stated Progress towards PT goals: Progressing toward goals    Frequency    Min 3X/week      PT Plan Current plan remains appropriate    Co-evaluation PT/OT/SLP Co-Evaluation/Treatment: Yes Reason for Co-Treatment: Necessary to address  cognition/behavior during functional activity;For patient/therapist safety;To address functional/ADL transfers PT goals addressed during session: Mobility/safety with mobility;Balance OT goals addressed during session: ADL's and self-care      AM-PAC PT "6 Clicks" Mobility   Outcome Measure  Help needed turning from your back to your side while in a flat bed without using bedrails?: A Lot Help needed moving from lying on your back to sitting on the side of a flat bed without using bedrails?: A Lot Help needed moving to and from a bed to a chair (including a wheelchair)?: A Lot Help needed standing up from a chair using your arms (e.g., wheelchair or bedside chair)?: A Lot Help needed to walk in hospital room?: A Lot Help needed climbing 3-5 steps with a railing? : Total 6 Click Score: 11    End of Session Equipment Utilized During Treatment: Gait belt Activity Tolerance: Patient tolerated treatment well Patient left: in chair;with call bell/phone within reach;with chair alarm set Nurse Communication: Mobility status PT Visit Diagnosis: Unsteadiness on feet (R26.81);Other abnormalities of gait and mobility (R26.89);History of falling (Z91.81);Muscle weakness (generalized) (M62.81);Hemiplegia and hemiparesis;Difficulty in walking, not elsewhere classified (R26.2) Hemiplegia - Right/Left: Left Hemiplegia - dominant/non-dominant: Non-dominant Hemiplegia - caused by: Cerebral infarction     Time: 0321-2248 PT Time Calculation (min) (ACUTE ONLY): 38 min  Charges:  $Gait Training: 8-22 mins                     Earney Navy, PTA Acute Rehabilitation Services Pager: [phone] Office: [phone]     Darliss Cheney 01/08/2020, 11:24 AM

## 2020-01-08 NOTE — Progress Notes (Signed)
Subjective: HD 5 No acute overnight events reported.  [name] was evaluated at bedside this morning. She is sitting up in bedside chair. She does not appear to be in any acute distress. She is currently refusing antihypertensive medications. Discussed discharge planning to rehab facility. Patient expresses understanding.  Contacted [name] and updated on discharge to SNF today.   Objective:  Vital signs in last 24 hours: Vitals:   01/07/20 1700 01/07/20 1934 01/07/20 2302 01/08/20 0347  BP: (!) 166/79 (S) (!) 190/76 (!) 177/74 (!) 169/78  Pulse: 79 80 79 81  Resp:  18 18 18   Temp: 98.2 F (36.8 C) 98.6 F (37 C) 99 F (37.2 C) 99.3 F (37.4 C)  TempSrc: Oral Oral Axillary Oral  SpO2: 100% 100% 100% 100%  Weight:    56.9 kg   CBC Latest Ref Rng & Units 01/08/2020 01/07/2020 01/06/2020  WBC 4.0 - 10.5 K/uL 7.1 8.1 5.8  Hemoglobin 12.0 - 15.0 g/dL 11.9(L) 11.5(L) 10.7(L)  Hematocrit 36 - 46 % 36.6 36.1 32.8(L)  Platelets 150 - 400 K/uL 261 286 249   BMP Latest Ref Rng & Units 01/08/2020 01/07/2020 01/06/2020  Glucose 70 - 99 mg/dL 134(H) 138(H) 121(H)  BUN 8 - 23 mg/dL 38(H) 38(H) 34(H)  Creatinine 0.44 - 1.00 mg/dL 3.34(H) 3.51(H) 3.14(H)  BUN/Creat Ratio 12 - 28 - - -  Sodium 135 - 145 mmol/L 141 140 138  Potassium 3.5 - 5.1 mmol/L 3.4(L) 3.7 3.5  Chloride 98 - 111 mmol/L 101 102 101  CO2 22 - 32 mmol/L 25 24 24   Calcium 8.9 - 10.3 mg/dL 9.1 8.9 8.6(L)   Physical Exam  Constitutional: Appears well-developed and well-nourished. No distress.  Head: Normocephalic and atraumatic - well healing scab on bridge of nose   Eyes: Conjunctivae are normal. EOMI  Cardiovascular: Normal rate, regular rhythm grade 3/6 systolic murmur at left sternal border Respiratory: Effort normal and breath sounds normal. No respiratory distress. No wheezes, rales or rhonchi  GI: Soft. Bowel sounds are normal. No distension. There is no tenderness.  Neurological: AO to self and  location; intermittently following commands, continued 3/5 strength of LUE and 5/5 in RUE Skin: Warm and dry, no erythema or lesions noted  Assessment/Plan:  Active Problems:   Occipital stroke [facility])  [name] is a 86 year old female with PMHx of dementia, prior CVA, type II DM, hypertension admitted for acute right PCA infarct.   Acute right occipital CVA: Patient presented with left sided hemiparesis, found to have right PCA territory infarct on MRI, likely cardioembolic. Stroke work up with carotid US and Echo without source of embolus. Patient is on warfarin therapy. At this time, will continue due to easy reversibility given her age and recurrent falls.  - Continue warfarin dosing per pharmacy, goal INR 2-3 - Continue atorvastatin 80mg  daily - Medically stable for discharge to SNF  CKD4: Patient with history of CKD4, not good candidate for dialysis due to age. She is on lasix 80mg  bid as outpatient for history of hypervolemia. She is currently continued on her home regimen. Noted to have 1.4L UOP and down 1.5kg from admission. Euvolemic on examination.  - Continue lasix 80mg  bid - Continue to monitor renal function - Strict I&O, daily weights - Avoid nephrotoxic agents   Hypertension: Patient is on amlodipine 5mg  daily and hydralazine 75mg  tid and furosemide 80mg  bid.  - Continue current regimen - IV labetalol 5mg  prn for SBP >180  Chronic  RLE DVT on coumadin:  Stable. Continue warfarin dosing per pharmacy.   FEN/GI: Diet: Dysphagia 2 Fluids: None Electrolytes: Monitor and replete prn   DVT Prophylaxis: Warfarin Code: DNR/DNI  Prior to Admission Living Arrangement: Home Anticipated Discharge Location: SNF Barriers to Discharge: None Dispo: Anticipated discharge today.  Harvie Heck, MD  Internal Medicine, PGY-1 01/08/2020, 7:36 AM Pager: [phone] After 5pm on weekdays and 1pm on weekends: On Call pager [phone]

## 2020-01-08 NOTE — Progress Notes (Signed)
NURSING PROGRESS NOTE  Chelsea Mcdaniel [id number] Discharge Data: 01/08/2020 1:33 PM Attending Provider: Sid Falcon, MD TTS:[id number], Verne Spurr, MD     Jarrett Soho to be D/C'd to [facility] for rehabilitation per MD order.  After Visit Summary and placed in packet along with signed DNR. Report attempted three times without success.  All IV's discontinued with no bleeding noted. All belongings returned to patient for patient to take along to the facility. Pt transported via stretcher by [facility].   Last Vital Signs:  Blood pressure (!) 171/72, pulse 75, temperature 99.1 F (37.3 C), resp. rate 16, weight 56.9 kg, SpO2 100 %.  Discharge Medication List Allergies as of 01/08/2020      Reactions   Diltiazem Other (See Comments)   Symptomatic bradaycardia   Diltiazem    unknown   Metoprolol Other (See Comments)   Symptomatic bradycardia   Metoprolol    unknown      Medication List    STOP taking these medications   lidocaine 5 % Commonly known as: Lidoderm     TAKE these medications   Accu-Chek Aviva Plus w/Device Kit Check blood sugar 1 time a day   Accu-Chek Softclix Lancet Dev Kit Use to check blood sugar one time a day   Accu-Chek Softclix Lancets lancets Use to check blood sugar 3 times daily. DIAG CODE E11.9   allopurinol 100 MG tablet Commonly known as: ZYLOPRIM TAKE 1/2 TABLET BY MOUTH EVERY OTHER DAY What changed: Another medication with the same name was removed. Continue taking this medication, and follow the directions you see here.   amLODipine 10 MG tablet Commonly known as: NORVASC Take 1 tablet (10 mg total) by mouth daily. What changed:   medication strength  how much to take  Another medication with the same name was removed. Continue taking this medication, and follow the directions you see here.   atorvastatin 40 MG tablet Commonly known as: LIPITOR Take 1 tablet (40 mg total) by mouth daily.   calcitRIOL 0.25 MCG capsule Commonly known  as: ROCALTROL Take 0.25 mcg by mouth daily.   cloNIDine 0.3 mg/24hr patch Commonly known as: CATAPRES - Dosed in mg/24 hr APPLY 1 PATCH TO SKIN EVERY 7 DAYS   dorzolamide-timolol 22.3-6.8 MG/ML ophthalmic solution Commonly known as: COSOPT Place 1 drop into both eyes 2 (two) times daily. What changed: Another medication with the same name was removed. Continue taking this medication, and follow the directions you see here.   furosemide 10 MG/ML solution Commonly known as: LASIX Take 8 mLs by mouth in the morning and at bedtime. What changed: Another medication with the same name was removed. Continue taking this medication, and follow the directions you see here.   GaviLAX 17 GM/SCOOP powder Generic drug: polyethylene glycol powder Take 17 grams by mouth 2 times daily What changed: Another medication with the same name was removed. Continue taking this medication, and follow the directions you see here.   glucose blood test strip Commonly known as: Accu-Chek Aviva Use as instructed   glucose blood test strip Commonly known as: Accu-Chek Aviva Plus USE 1 TIME DAILY TO CHECK BLOOD SUGAR   hydrALAZINE 25 MG tablet Commonly known as: APRESOLINE Take 3 tablets (75 mg total) by mouth 3 (three) times daily. What changed: Another medication with the same name was removed. Continue taking this medication, and follow the directions you see here.   Januvia 25 MG tablet Generic drug: sitaGLIPtin TAKE 1 TABLET BY MOUTH EVERY  DAY What changed: Another medication with the same name was removed. Continue taking this medication, and follow the directions you see here.   latanoprost 0.005 % ophthalmic solution Commonly known as: XALATAN Place 1 drop into both eyes at bedtime.   Lumigan 0.01 % Soln Generic drug: bimatoprost Place 1 drop into both eyes at bedtime. What changed: Another medication with the same name was removed. Continue taking this medication, and follow the directions you  see here.   Melatonin 1 MG/ML Liqd TAKE 3.5 mls BY MOUTH AT BEDTIME AS NEEDED What changed: Another medication with the same name was removed. Continue taking this medication, and follow the directions you see here.   warfarin 5 MG tablet Commonly known as: COUMADIN Take as directed. If you are unsure how to take this medication, talk to your nurse or doctor. Original instructions: Take 5 mg by mouth daily. What changed: Another medication with the same name was removed. Continue taking this medication, and follow the directions you see here.            Durable Medical Equipment  (From admission, onward)         Start     Ordered   01/08/20 1115  DME 3-in-1  Once        01/08/20 1116

## 2020-01-08 NOTE — TOC Transition Note (Signed)
Transition of Care [facility]) - CM/SW Discharge Note   Patient Details  Name: Chelsea Mcdaniel MRN: [id number] Date of Birth: 02-01-1928  [facility] CM/SW Contact:  Geralynn Ochs, LCSW Phone Number: 01/08/2020, 11:53 AM   Clinical Narrative:   Nurse to call report to [phone].    Final next level of care: Skilled Nursing Facility Barriers to Discharge: Barriers Resolved   Patient Goals and CMS Choice Patient states their goals for this hospitalization and ongoing recovery are:: patient unable to participate in goal setting due to confusion CMS Medicare.gov Compare Post Acute Care list provided to:: Patient Represenative (must comment) Choice offered to / list presented to : Adult Children, [place] / [place]  Discharge Placement              [facility] Patient to be transferred to facility by: [place] Name of family member notified: Georgeanna Patient and family notified of of transfer: 01/08/20  Discharge Plan and Services     Post Acute Care Choice: [place]                               Social Determinants of Health (SDOH) Interventions     Readmission Risk Interventions No flowsheet data found.

## 2020-01-09 ENCOUNTER — Other Ambulatory Visit: Payer: Self-pay

## 2020-01-09 ENCOUNTER — Encounter (HOSPITAL_COMMUNITY)
Admission: RE | Admit: 2020-01-09 | Discharge: 2020-01-09 | Disposition: A | Discharge status: Home / Self Care | Payer: Medicare Other | Source: Ambulatory Visit | Attending: Nephrology | Admitting: Nephrology

## 2020-01-09 VITALS — BP 167/68 | HR 74 | Temp 98.0°F

## 2020-01-09 DIAGNOSIS — I639 Cerebral infarction, unspecified: Secondary | ICD-10-CM | POA: Diagnosis not present

## 2020-01-09 DIAGNOSIS — Z7901 Long term (current) use of anticoagulants: Secondary | ICD-10-CM | POA: Diagnosis not present

## 2020-01-09 DIAGNOSIS — I129 Hypertensive chronic kidney disease with stage 1 through stage 4 chronic kidney disease, or unspecified chronic kidney disease: Secondary | ICD-10-CM | POA: Diagnosis not present

## 2020-01-09 DIAGNOSIS — N184 Chronic kidney disease, stage 4 (severe): Secondary | ICD-10-CM | POA: Diagnosis not present

## 2020-01-09 LAB — POCT HEMOGLOBIN-HEMACUE: Hemoglobin: 13.4 g/dL (ref 12.0–15.0)

## 2020-01-09 MED ORDER — EPOETIN ALFA-EPBX 10000 UNIT/ML IJ SOLN: 10000.0000 [IU] | INTRAMUSCULAR | Status: DC

## 2020-01-10 DIAGNOSIS — I129 Hypertensive chronic kidney disease with stage 1 through stage 4 chronic kidney disease, or unspecified chronic kidney disease: Secondary | ICD-10-CM | POA: Diagnosis not present

## 2020-01-10 DIAGNOSIS — I10 Essential (primary) hypertension: Secondary | ICD-10-CM | POA: Diagnosis not present

## 2020-01-10 DIAGNOSIS — I639 Cerebral infarction, unspecified: Secondary | ICD-10-CM | POA: Diagnosis not present

## 2020-01-10 DIAGNOSIS — E119 Type 2 diabetes mellitus without complications: Secondary | ICD-10-CM | POA: Diagnosis not present

## 2020-01-10 DIAGNOSIS — N184 Chronic kidney disease, stage 4 (severe): Secondary | ICD-10-CM | POA: Diagnosis not present

## 2020-01-11 ENCOUNTER — Encounter: Payer: Self-pay | Admitting: *Deleted

## 2020-01-11 DIAGNOSIS — D649 Anemia, unspecified: Secondary | ICD-10-CM | POA: Diagnosis not present

## 2020-01-11 DIAGNOSIS — Z5181 Encounter for therapeutic drug level monitoring: Secondary | ICD-10-CM | POA: Diagnosis not present

## 2020-01-11 DIAGNOSIS — Z7901 Long term (current) use of anticoagulants: Secondary | ICD-10-CM | POA: Diagnosis not present

## 2020-01-11 DIAGNOSIS — I639 Cerebral infarction, unspecified: Secondary | ICD-10-CM | POA: Diagnosis not present

## 2020-01-14 ENCOUNTER — Ambulatory Visit: Payer: Medicare Other

## 2020-01-14 DIAGNOSIS — Z7901 Long term (current) use of anticoagulants: Secondary | ICD-10-CM | POA: Diagnosis not present

## 2020-01-14 DIAGNOSIS — Z5181 Encounter for therapeutic drug level monitoring: Secondary | ICD-10-CM | POA: Diagnosis not present

## 2020-01-15 DIAGNOSIS — N184 Chronic kidney disease, stage 4 (severe): Secondary | ICD-10-CM | POA: Diagnosis not present

## 2020-01-15 DIAGNOSIS — D649 Anemia, unspecified: Secondary | ICD-10-CM | POA: Diagnosis not present

## 2020-01-15 DIAGNOSIS — I129 Hypertensive chronic kidney disease with stage 1 through stage 4 chronic kidney disease, or unspecified chronic kidney disease: Secondary | ICD-10-CM | POA: Diagnosis not present

## 2020-01-16 DIAGNOSIS — N184 Chronic kidney disease, stage 4 (severe): Secondary | ICD-10-CM | POA: Diagnosis not present

## 2020-01-16 DIAGNOSIS — I129 Hypertensive chronic kidney disease with stage 1 through stage 4 chronic kidney disease, or unspecified chronic kidney disease: Secondary | ICD-10-CM | POA: Diagnosis not present

## 2020-01-17 ENCOUNTER — Encounter: Payer: Self-pay | Admitting: Internal Medicine

## 2020-01-17 DIAGNOSIS — Z5181 Encounter for therapeutic drug level monitoring: Secondary | ICD-10-CM | POA: Diagnosis not present

## 2020-01-17 DIAGNOSIS — Z7901 Long term (current) use of anticoagulants: Secondary | ICD-10-CM | POA: Diagnosis not present

## 2020-01-17 DIAGNOSIS — I69354 Hemiplegia and hemiparesis following cerebral infarction affecting left non-dominant side: Secondary | ICD-10-CM | POA: Insufficient documentation

## 2020-01-18 DIAGNOSIS — Z7901 Long term (current) use of anticoagulants: Secondary | ICD-10-CM | POA: Diagnosis not present

## 2020-01-18 DIAGNOSIS — I129 Hypertensive chronic kidney disease with stage 1 through stage 4 chronic kidney disease, or unspecified chronic kidney disease: Secondary | ICD-10-CM | POA: Diagnosis not present

## 2020-01-18 DIAGNOSIS — Z5181 Encounter for therapeutic drug level monitoring: Secondary | ICD-10-CM | POA: Diagnosis not present

## 2020-01-21 DIAGNOSIS — Z5181 Encounter for therapeutic drug level monitoring: Secondary | ICD-10-CM | POA: Diagnosis not present

## 2020-01-21 DIAGNOSIS — N184 Chronic kidney disease, stage 4 (severe): Secondary | ICD-10-CM | POA: Diagnosis not present

## 2020-01-21 DIAGNOSIS — I129 Hypertensive chronic kidney disease with stage 1 through stage 4 chronic kidney disease, or unspecified chronic kidney disease: Secondary | ICD-10-CM | POA: Diagnosis not present

## 2020-01-23 ENCOUNTER — Encounter (HOSPITAL_COMMUNITY): Payer: Medicare Other

## 2020-01-23 DIAGNOSIS — I129 Hypertensive chronic kidney disease with stage 1 through stage 4 chronic kidney disease, or unspecified chronic kidney disease: Secondary | ICD-10-CM | POA: Diagnosis not present

## 2020-01-23 DIAGNOSIS — Z5181 Encounter for therapeutic drug level monitoring: Secondary | ICD-10-CM | POA: Diagnosis not present

## 2020-01-23 DIAGNOSIS — H04123 Dry eye syndrome of bilateral lacrimal glands: Secondary | ICD-10-CM | POA: Diagnosis not present

## 2020-01-25 DIAGNOSIS — I129 Hypertensive chronic kidney disease with stage 1 through stage 4 chronic kidney disease, or unspecified chronic kidney disease: Secondary | ICD-10-CM | POA: Diagnosis not present

## 2020-01-25 DIAGNOSIS — Z5181 Encounter for therapeutic drug level monitoring: Secondary | ICD-10-CM | POA: Diagnosis not present

## 2020-01-25 DIAGNOSIS — N184 Chronic kidney disease, stage 4 (severe): Secondary | ICD-10-CM | POA: Diagnosis not present

## 2020-01-29 DIAGNOSIS — I129 Hypertensive chronic kidney disease with stage 1 through stage 4 chronic kidney disease, or unspecified chronic kidney disease: Secondary | ICD-10-CM | POA: Diagnosis not present

## 2020-01-29 DIAGNOSIS — Z5181 Encounter for therapeutic drug level monitoring: Secondary | ICD-10-CM | POA: Diagnosis not present

## 2020-01-30 DIAGNOSIS — Z5181 Encounter for therapeutic drug level monitoring: Secondary | ICD-10-CM | POA: Diagnosis not present

## 2020-01-30 DIAGNOSIS — Z7901 Long term (current) use of anticoagulants: Secondary | ICD-10-CM | POA: Diagnosis not present

## 2020-01-30 DIAGNOSIS — I82501 Chronic embolism and thrombosis of unspecified deep veins of right lower extremity: Secondary | ICD-10-CM | POA: Diagnosis not present

## 2020-01-31 DIAGNOSIS — Z5181 Encounter for therapeutic drug level monitoring: Secondary | ICD-10-CM | POA: Diagnosis not present

## 2020-01-31 DIAGNOSIS — Z7901 Long term (current) use of anticoagulants: Secondary | ICD-10-CM | POA: Diagnosis not present

## 2020-02-01 ENCOUNTER — Ambulatory Visit (HOSPITAL_COMMUNITY)
Admission: RE | Admit: 2020-02-01 | Discharge: 2020-02-01 | Disposition: A | Discharge status: Home / Self Care | Payer: Medicare Other | Source: Ambulatory Visit | Attending: Nephrology | Admitting: Nephrology

## 2020-02-01 ENCOUNTER — Other Ambulatory Visit: Payer: Self-pay

## 2020-02-01 VITALS — BP 160/69 | HR 65 | Temp 97.5°F | Resp 18

## 2020-02-01 DIAGNOSIS — I129 Hypertensive chronic kidney disease with stage 1 through stage 4 chronic kidney disease, or unspecified chronic kidney disease: Secondary | ICD-10-CM | POA: Diagnosis not present

## 2020-02-01 DIAGNOSIS — N184 Chronic kidney disease, stage 4 (severe): Secondary | ICD-10-CM | POA: Diagnosis not present

## 2020-02-01 LAB — IRON AND TIBC
Iron: 35 ug/dL (ref 28–170)
Saturation Ratios: 23 % (ref 10.4–31.8)
TIBC: 155 ug/dL — ABNORMAL LOW (ref 250–450)
UIBC: 120 ug/dL

## 2020-02-01 LAB — POCT HEMOGLOBIN-HEMACUE: Hemoglobin: 10.6 g/dL — ABNORMAL LOW (ref 12.0–15.0)

## 2020-02-01 LAB — FERRITIN: Ferritin: 681 ng/mL — ABNORMAL HIGH (ref 11–307)

## 2020-02-01 MED ORDER — EPOETIN ALFA-EPBX 10000 UNIT/ML IJ SOLN
INTRAMUSCULAR | Status: AC
  Administered 2020-02-01: 10000 [IU] via SUBCUTANEOUS
  Filled 2020-02-01: qty 1

## 2020-02-01 MED ORDER — EPOETIN ALFA-EPBX 10000 UNIT/ML IJ SOLN: 10000.0000 [IU] | INTRAMUSCULAR | Status: DC

## 2020-02-04 DIAGNOSIS — M6281 Muscle weakness (generalized): Secondary | ICD-10-CM | POA: Diagnosis not present

## 2020-02-04 DIAGNOSIS — R488 Other symbolic dysfunctions: Secondary | ICD-10-CM | POA: Diagnosis not present

## 2020-02-04 DIAGNOSIS — I110 Hypertensive heart disease with heart failure: Secondary | ICD-10-CM | POA: Diagnosis not present

## 2020-02-04 DIAGNOSIS — I639 Cerebral infarction, unspecified: Secondary | ICD-10-CM | POA: Diagnosis not present

## 2020-02-04 DIAGNOSIS — I69954 Hemiplegia and hemiparesis following unspecified cerebrovascular disease affecting left non-dominant side: Secondary | ICD-10-CM | POA: Diagnosis not present

## 2020-02-04 DIAGNOSIS — I129 Hypertensive chronic kidney disease with stage 1 through stage 4 chronic kidney disease, or unspecified chronic kidney disease: Secondary | ICD-10-CM | POA: Diagnosis not present

## 2020-02-04 DIAGNOSIS — R1312 Dysphagia, oropharyngeal phase: Secondary | ICD-10-CM | POA: Diagnosis not present

## 2020-02-04 DIAGNOSIS — Z5181 Encounter for therapeutic drug level monitoring: Secondary | ICD-10-CM | POA: Diagnosis not present

## 2020-02-04 DIAGNOSIS — R279 Unspecified lack of coordination: Secondary | ICD-10-CM | POA: Diagnosis not present

## 2020-02-04 DIAGNOSIS — R2681 Unsteadiness on feet: Secondary | ICD-10-CM | POA: Diagnosis not present

## 2020-02-04 DIAGNOSIS — D649 Anemia, unspecified: Secondary | ICD-10-CM | POA: Diagnosis not present

## 2020-02-04 DIAGNOSIS — R1319 Other dysphagia: Secondary | ICD-10-CM | POA: Diagnosis not present

## 2020-02-04 DIAGNOSIS — R262 Difficulty in walking, not elsewhere classified: Secondary | ICD-10-CM | POA: Diagnosis not present

## 2020-02-04 DIAGNOSIS — N184 Chronic kidney disease, stage 4 (severe): Secondary | ICD-10-CM | POA: Diagnosis not present

## 2020-02-05 DIAGNOSIS — E876 Hypokalemia: Secondary | ICD-10-CM | POA: Diagnosis not present

## 2020-02-05 DIAGNOSIS — B370 Candidal stomatitis: Secondary | ICD-10-CM | POA: Diagnosis not present

## 2020-02-05 DIAGNOSIS — N184 Chronic kidney disease, stage 4 (severe): Secondary | ICD-10-CM | POA: Diagnosis not present

## 2020-02-05 DIAGNOSIS — I129 Hypertensive chronic kidney disease with stage 1 through stage 4 chronic kidney disease, or unspecified chronic kidney disease: Secondary | ICD-10-CM | POA: Diagnosis not present

## 2020-02-06 ENCOUNTER — Encounter (HOSPITAL_COMMUNITY): Payer: Medicare Other

## 2020-02-07 DIAGNOSIS — Z7901 Long term (current) use of anticoagulants: Secondary | ICD-10-CM | POA: Diagnosis not present

## 2020-02-07 DIAGNOSIS — Z5181 Encounter for therapeutic drug level monitoring: Secondary | ICD-10-CM | POA: Diagnosis not present

## 2020-02-08 DIAGNOSIS — Z5181 Encounter for therapeutic drug level monitoring: Secondary | ICD-10-CM | POA: Diagnosis not present

## 2020-02-08 DIAGNOSIS — I129 Hypertensive chronic kidney disease with stage 1 through stage 4 chronic kidney disease, or unspecified chronic kidney disease: Secondary | ICD-10-CM | POA: Diagnosis not present

## 2020-02-11 DIAGNOSIS — D649 Anemia, unspecified: Secondary | ICD-10-CM | POA: Diagnosis not present

## 2020-02-11 DIAGNOSIS — E119 Type 2 diabetes mellitus without complications: Secondary | ICD-10-CM | POA: Diagnosis not present

## 2020-02-11 DIAGNOSIS — N184 Chronic kidney disease, stage 4 (severe): Secondary | ICD-10-CM | POA: Diagnosis not present

## 2020-02-11 DIAGNOSIS — I110 Hypertensive heart disease with heart failure: Secondary | ICD-10-CM | POA: Diagnosis not present

## 2020-02-11 DIAGNOSIS — Z7901 Long term (current) use of anticoagulants: Secondary | ICD-10-CM | POA: Diagnosis not present

## 2020-02-11 DIAGNOSIS — Z5181 Encounter for therapeutic drug level monitoring: Secondary | ICD-10-CM | POA: Diagnosis not present

## 2020-02-12 DIAGNOSIS — I4891 Unspecified atrial fibrillation: Secondary | ICD-10-CM | POA: Diagnosis not present

## 2020-02-12 DIAGNOSIS — Z7901 Long term (current) use of anticoagulants: Secondary | ICD-10-CM | POA: Diagnosis not present

## 2020-02-12 DIAGNOSIS — Z5181 Encounter for therapeutic drug level monitoring: Secondary | ICD-10-CM | POA: Diagnosis not present

## 2020-02-14 DIAGNOSIS — I4891 Unspecified atrial fibrillation: Secondary | ICD-10-CM | POA: Diagnosis not present

## 2020-02-14 DIAGNOSIS — Z5181 Encounter for therapeutic drug level monitoring: Secondary | ICD-10-CM | POA: Diagnosis not present

## 2020-02-14 DIAGNOSIS — M79606 Pain in leg, unspecified: Secondary | ICD-10-CM | POA: Diagnosis not present

## 2020-02-14 DIAGNOSIS — Z7901 Long term (current) use of anticoagulants: Secondary | ICD-10-CM | POA: Diagnosis not present

## 2020-02-18 DIAGNOSIS — Z7901 Long term (current) use of anticoagulants: Secondary | ICD-10-CM | POA: Diagnosis not present

## 2020-02-18 DIAGNOSIS — Z5181 Encounter for therapeutic drug level monitoring: Secondary | ICD-10-CM | POA: Diagnosis not present

## 2020-02-18 DIAGNOSIS — I82501 Chronic embolism and thrombosis of unspecified deep veins of right lower extremity: Secondary | ICD-10-CM | POA: Diagnosis not present

## 2020-02-21 DIAGNOSIS — Z7901 Long term (current) use of anticoagulants: Secondary | ICD-10-CM | POA: Diagnosis not present

## 2020-02-21 DIAGNOSIS — Z5181 Encounter for therapeutic drug level monitoring: Secondary | ICD-10-CM | POA: Diagnosis not present

## 2020-02-22 DIAGNOSIS — Z20822 Contact with and (suspected) exposure to covid-19: Secondary | ICD-10-CM | POA: Diagnosis not present

## 2020-02-29 DIAGNOSIS — Z03818 Encounter for observation for suspected exposure to other biological agents ruled out: Secondary | ICD-10-CM | POA: Diagnosis not present

## 2020-03-14 DIAGNOSIS — Z03818 Encounter for observation for suspected exposure to other biological agents ruled out: Secondary | ICD-10-CM | POA: Diagnosis not present

## 2020-03-17 DIAGNOSIS — L89153 Pressure ulcer of sacral region, stage 3: Secondary | ICD-10-CM | POA: Diagnosis not present

## 2020-03-19 DIAGNOSIS — I639 Cerebral infarction, unspecified: Secondary | ICD-10-CM | POA: Diagnosis not present

## 2020-03-19 DIAGNOSIS — Z7901 Long term (current) use of anticoagulants: Secondary | ICD-10-CM | POA: Diagnosis not present

## 2020-03-20 DIAGNOSIS — L89150 Pressure ulcer of sacral region, unstageable: Secondary | ICD-10-CM | POA: Diagnosis not present

## 2020-03-20 DIAGNOSIS — L89610 Pressure ulcer of right heel, unstageable: Secondary | ICD-10-CM | POA: Diagnosis not present

## 2020-03-21 DIAGNOSIS — H04123 Dry eye syndrome of bilateral lacrimal glands: Secondary | ICD-10-CM | POA: Diagnosis not present

## 2020-03-21 DIAGNOSIS — I69954 Hemiplegia and hemiparesis following unspecified cerebrovascular disease affecting left non-dominant side: Secondary | ICD-10-CM | POA: Diagnosis not present

## 2020-03-21 DIAGNOSIS — B370 Candidal stomatitis: Secondary | ICD-10-CM | POA: Diagnosis not present

## 2020-03-21 DIAGNOSIS — I82501 Chronic embolism and thrombosis of unspecified deep veins of right lower extremity: Secondary | ICD-10-CM | POA: Diagnosis not present

## 2020-03-24 DIAGNOSIS — I69954 Hemiplegia and hemiparesis following unspecified cerebrovascular disease affecting left non-dominant side: Secondary | ICD-10-CM | POA: Diagnosis not present

## 2020-03-24 DIAGNOSIS — R1319 Other dysphagia: Secondary | ICD-10-CM | POA: Diagnosis not present

## 2020-03-24 DIAGNOSIS — E119 Type 2 diabetes mellitus without complications: Secondary | ICD-10-CM | POA: Diagnosis not present

## 2020-03-24 DIAGNOSIS — I639 Cerebral infarction, unspecified: Secondary | ICD-10-CM | POA: Diagnosis not present

## 2020-03-24 DIAGNOSIS — R279 Unspecified lack of coordination: Secondary | ICD-10-CM | POA: Diagnosis not present

## 2020-03-24 DIAGNOSIS — M24562 Contracture, left knee: Secondary | ICD-10-CM | POA: Diagnosis not present

## 2020-03-24 DIAGNOSIS — R1312 Dysphagia, oropharyngeal phase: Secondary | ICD-10-CM | POA: Diagnosis not present

## 2020-03-24 DIAGNOSIS — R2681 Unsteadiness on feet: Secondary | ICD-10-CM | POA: Diagnosis not present

## 2020-03-24 DIAGNOSIS — R262 Difficulty in walking, not elsewhere classified: Secondary | ICD-10-CM | POA: Diagnosis not present

## 2020-03-24 DIAGNOSIS — R488 Other symbolic dysfunctions: Secondary | ICD-10-CM | POA: Diagnosis not present

## 2020-03-24 DIAGNOSIS — M6281 Muscle weakness (generalized): Secondary | ICD-10-CM | POA: Diagnosis not present

## 2020-03-25 DIAGNOSIS — M6281 Muscle weakness (generalized): Secondary | ICD-10-CM | POA: Diagnosis not present

## 2020-03-25 DIAGNOSIS — R488 Other symbolic dysfunctions: Secondary | ICD-10-CM | POA: Diagnosis not present

## 2020-03-25 DIAGNOSIS — R1312 Dysphagia, oropharyngeal phase: Secondary | ICD-10-CM | POA: Diagnosis not present

## 2020-03-25 DIAGNOSIS — R279 Unspecified lack of coordination: Secondary | ICD-10-CM | POA: Diagnosis not present

## 2020-03-25 DIAGNOSIS — R262 Difficulty in walking, not elsewhere classified: Secondary | ICD-10-CM | POA: Diagnosis not present

## 2020-03-25 DIAGNOSIS — M24562 Contracture, left knee: Secondary | ICD-10-CM | POA: Diagnosis not present

## 2020-03-25 DIAGNOSIS — R2681 Unsteadiness on feet: Secondary | ICD-10-CM | POA: Diagnosis not present

## 2020-03-25 DIAGNOSIS — R1319 Other dysphagia: Secondary | ICD-10-CM | POA: Diagnosis not present

## 2020-03-25 DIAGNOSIS — I639 Cerebral infarction, unspecified: Secondary | ICD-10-CM | POA: Diagnosis not present

## 2020-03-25 DIAGNOSIS — E119 Type 2 diabetes mellitus without complications: Secondary | ICD-10-CM | POA: Diagnosis not present

## 2020-03-25 DIAGNOSIS — I69954 Hemiplegia and hemiparesis following unspecified cerebrovascular disease affecting left non-dominant side: Secondary | ICD-10-CM | POA: Diagnosis not present

## 2020-03-26 DIAGNOSIS — M6281 Muscle weakness (generalized): Secondary | ICD-10-CM | POA: Diagnosis not present

## 2020-03-26 DIAGNOSIS — R2681 Unsteadiness on feet: Secondary | ICD-10-CM | POA: Diagnosis not present

## 2020-03-26 DIAGNOSIS — R279 Unspecified lack of coordination: Secondary | ICD-10-CM | POA: Diagnosis not present

## 2020-03-26 DIAGNOSIS — M62442 Contracture of muscle, left hand: Secondary | ICD-10-CM | POA: Diagnosis not present

## 2020-03-26 DIAGNOSIS — I69954 Hemiplegia and hemiparesis following unspecified cerebrovascular disease affecting left non-dominant side: Secondary | ICD-10-CM | POA: Diagnosis not present

## 2020-03-26 DIAGNOSIS — M24562 Contracture, left knee: Secondary | ICD-10-CM | POA: Diagnosis not present

## 2020-03-26 DIAGNOSIS — R262 Difficulty in walking, not elsewhere classified: Secondary | ICD-10-CM | POA: Diagnosis not present

## 2020-03-26 DIAGNOSIS — E119 Type 2 diabetes mellitus without complications: Secondary | ICD-10-CM | POA: Diagnosis not present

## 2020-03-27 DIAGNOSIS — R279 Unspecified lack of coordination: Secondary | ICD-10-CM | POA: Diagnosis not present

## 2020-03-27 DIAGNOSIS — M24562 Contracture, left knee: Secondary | ICD-10-CM | POA: Diagnosis not present

## 2020-03-27 DIAGNOSIS — R2681 Unsteadiness on feet: Secondary | ICD-10-CM | POA: Diagnosis not present

## 2020-03-27 DIAGNOSIS — R262 Difficulty in walking, not elsewhere classified: Secondary | ICD-10-CM | POA: Diagnosis not present

## 2020-03-27 DIAGNOSIS — E119 Type 2 diabetes mellitus without complications: Secondary | ICD-10-CM | POA: Diagnosis not present

## 2020-03-27 DIAGNOSIS — M6281 Muscle weakness (generalized): Secondary | ICD-10-CM | POA: Diagnosis not present

## 2020-03-27 DIAGNOSIS — M62442 Contracture of muscle, left hand: Secondary | ICD-10-CM | POA: Diagnosis not present

## 2020-03-27 DIAGNOSIS — I69954 Hemiplegia and hemiparesis following unspecified cerebrovascular disease affecting left non-dominant side: Secondary | ICD-10-CM | POA: Diagnosis not present

## 2020-03-28 DIAGNOSIS — R2681 Unsteadiness on feet: Secondary | ICD-10-CM | POA: Diagnosis not present

## 2020-03-28 DIAGNOSIS — E119 Type 2 diabetes mellitus without complications: Secondary | ICD-10-CM | POA: Diagnosis not present

## 2020-03-28 DIAGNOSIS — Z03818 Encounter for observation for suspected exposure to other biological agents ruled out: Secondary | ICD-10-CM | POA: Diagnosis not present

## 2020-03-28 DIAGNOSIS — M6281 Muscle weakness (generalized): Secondary | ICD-10-CM | POA: Diagnosis not present

## 2020-03-28 DIAGNOSIS — M62442 Contracture of muscle, left hand: Secondary | ICD-10-CM | POA: Diagnosis not present

## 2020-03-28 DIAGNOSIS — R262 Difficulty in walking, not elsewhere classified: Secondary | ICD-10-CM | POA: Diagnosis not present

## 2020-03-28 DIAGNOSIS — R279 Unspecified lack of coordination: Secondary | ICD-10-CM | POA: Diagnosis not present

## 2020-03-28 DIAGNOSIS — M24562 Contracture, left knee: Secondary | ICD-10-CM | POA: Diagnosis not present

## 2020-03-28 DIAGNOSIS — I69954 Hemiplegia and hemiparesis following unspecified cerebrovascular disease affecting left non-dominant side: Secondary | ICD-10-CM | POA: Diagnosis not present

## 2020-03-31 DIAGNOSIS — R2681 Unsteadiness on feet: Secondary | ICD-10-CM | POA: Diagnosis not present

## 2020-03-31 DIAGNOSIS — E119 Type 2 diabetes mellitus without complications: Secondary | ICD-10-CM | POA: Diagnosis not present

## 2020-03-31 DIAGNOSIS — M24562 Contracture, left knee: Secondary | ICD-10-CM | POA: Diagnosis not present

## 2020-03-31 DIAGNOSIS — R279 Unspecified lack of coordination: Secondary | ICD-10-CM | POA: Diagnosis not present

## 2020-03-31 DIAGNOSIS — M6281 Muscle weakness (generalized): Secondary | ICD-10-CM | POA: Diagnosis not present

## 2020-03-31 DIAGNOSIS — I69954 Hemiplegia and hemiparesis following unspecified cerebrovascular disease affecting left non-dominant side: Secondary | ICD-10-CM | POA: Diagnosis not present

## 2020-03-31 DIAGNOSIS — R262 Difficulty in walking, not elsewhere classified: Secondary | ICD-10-CM | POA: Diagnosis not present

## 2020-03-31 DIAGNOSIS — M62442 Contracture of muscle, left hand: Secondary | ICD-10-CM | POA: Diagnosis not present

## 2020-04-01 DIAGNOSIS — I69954 Hemiplegia and hemiparesis following unspecified cerebrovascular disease affecting left non-dominant side: Secondary | ICD-10-CM | POA: Diagnosis not present

## 2020-04-01 DIAGNOSIS — M6281 Muscle weakness (generalized): Secondary | ICD-10-CM | POA: Diagnosis not present

## 2020-04-01 DIAGNOSIS — E119 Type 2 diabetes mellitus without complications: Secondary | ICD-10-CM | POA: Diagnosis not present

## 2020-04-01 DIAGNOSIS — R279 Unspecified lack of coordination: Secondary | ICD-10-CM | POA: Diagnosis not present

## 2020-04-01 DIAGNOSIS — M62442 Contracture of muscle, left hand: Secondary | ICD-10-CM | POA: Diagnosis not present

## 2020-04-01 DIAGNOSIS — M24562 Contracture, left knee: Secondary | ICD-10-CM | POA: Diagnosis not present

## 2020-04-01 DIAGNOSIS — R2681 Unsteadiness on feet: Secondary | ICD-10-CM | POA: Diagnosis not present

## 2020-04-01 DIAGNOSIS — R262 Difficulty in walking, not elsewhere classified: Secondary | ICD-10-CM | POA: Diagnosis not present

## 2020-04-02 DIAGNOSIS — M62442 Contracture of muscle, left hand: Secondary | ICD-10-CM | POA: Diagnosis not present

## 2020-04-02 DIAGNOSIS — M24562 Contracture, left knee: Secondary | ICD-10-CM | POA: Diagnosis not present

## 2020-04-02 DIAGNOSIS — R279 Unspecified lack of coordination: Secondary | ICD-10-CM | POA: Diagnosis not present

## 2020-04-02 DIAGNOSIS — R2681 Unsteadiness on feet: Secondary | ICD-10-CM | POA: Diagnosis not present

## 2020-04-02 DIAGNOSIS — M6281 Muscle weakness (generalized): Secondary | ICD-10-CM | POA: Diagnosis not present

## 2020-04-02 DIAGNOSIS — R262 Difficulty in walking, not elsewhere classified: Secondary | ICD-10-CM | POA: Diagnosis not present

## 2020-04-02 DIAGNOSIS — I69954 Hemiplegia and hemiparesis following unspecified cerebrovascular disease affecting left non-dominant side: Secondary | ICD-10-CM | POA: Diagnosis not present

## 2020-04-02 DIAGNOSIS — E119 Type 2 diabetes mellitus without complications: Secondary | ICD-10-CM | POA: Diagnosis not present

## 2020-04-03 DIAGNOSIS — E119 Type 2 diabetes mellitus without complications: Secondary | ICD-10-CM | POA: Diagnosis not present

## 2020-04-03 DIAGNOSIS — R2681 Unsteadiness on feet: Secondary | ICD-10-CM | POA: Diagnosis not present

## 2020-04-03 DIAGNOSIS — I69954 Hemiplegia and hemiparesis following unspecified cerebrovascular disease affecting left non-dominant side: Secondary | ICD-10-CM | POA: Diagnosis not present

## 2020-04-03 DIAGNOSIS — M24562 Contracture, left knee: Secondary | ICD-10-CM | POA: Diagnosis not present

## 2020-04-03 DIAGNOSIS — R262 Difficulty in walking, not elsewhere classified: Secondary | ICD-10-CM | POA: Diagnosis not present

## 2020-04-03 DIAGNOSIS — R279 Unspecified lack of coordination: Secondary | ICD-10-CM | POA: Diagnosis not present

## 2020-04-03 DIAGNOSIS — M6281 Muscle weakness (generalized): Secondary | ICD-10-CM | POA: Diagnosis not present

## 2020-04-03 DIAGNOSIS — M62442 Contracture of muscle, left hand: Secondary | ICD-10-CM | POA: Diagnosis not present

## 2020-04-04 DIAGNOSIS — Z20822 Contact with and (suspected) exposure to covid-19: Secondary | ICD-10-CM | POA: Diagnosis not present

## 2020-04-04 DIAGNOSIS — E119 Type 2 diabetes mellitus without complications: Secondary | ICD-10-CM | POA: Diagnosis not present

## 2020-04-04 DIAGNOSIS — R279 Unspecified lack of coordination: Secondary | ICD-10-CM | POA: Diagnosis not present

## 2020-04-04 DIAGNOSIS — R2681 Unsteadiness on feet: Secondary | ICD-10-CM | POA: Diagnosis not present

## 2020-04-04 DIAGNOSIS — R262 Difficulty in walking, not elsewhere classified: Secondary | ICD-10-CM | POA: Diagnosis not present

## 2020-04-04 DIAGNOSIS — M62442 Contracture of muscle, left hand: Secondary | ICD-10-CM | POA: Diagnosis not present

## 2020-04-04 DIAGNOSIS — M24562 Contracture, left knee: Secondary | ICD-10-CM | POA: Diagnosis not present

## 2020-04-04 DIAGNOSIS — M6281 Muscle weakness (generalized): Secondary | ICD-10-CM | POA: Diagnosis not present

## 2020-04-04 DIAGNOSIS — I69954 Hemiplegia and hemiparesis following unspecified cerebrovascular disease affecting left non-dominant side: Secondary | ICD-10-CM | POA: Diagnosis not present

## 2020-04-07 DIAGNOSIS — M6281 Muscle weakness (generalized): Secondary | ICD-10-CM | POA: Diagnosis not present

## 2020-04-07 DIAGNOSIS — I120 Hypertensive chronic kidney disease with stage 5 chronic kidney disease or end stage renal disease: Secondary | ICD-10-CM | POA: Diagnosis not present

## 2020-04-07 DIAGNOSIS — N189 Chronic kidney disease, unspecified: Secondary | ICD-10-CM | POA: Diagnosis not present

## 2020-04-07 DIAGNOSIS — R279 Unspecified lack of coordination: Secondary | ICD-10-CM | POA: Diagnosis not present

## 2020-04-07 DIAGNOSIS — E119 Type 2 diabetes mellitus without complications: Secondary | ICD-10-CM | POA: Diagnosis not present

## 2020-04-07 DIAGNOSIS — M62442 Contracture of muscle, left hand: Secondary | ICD-10-CM | POA: Diagnosis not present

## 2020-04-07 DIAGNOSIS — R600 Localized edema: Secondary | ICD-10-CM | POA: Diagnosis not present

## 2020-04-07 DIAGNOSIS — M24562 Contracture, left knee: Secondary | ICD-10-CM | POA: Diagnosis not present

## 2020-04-07 DIAGNOSIS — D631 Anemia in chronic kidney disease: Secondary | ICD-10-CM | POA: Diagnosis not present

## 2020-04-07 DIAGNOSIS — N2581 Secondary hyperparathyroidism of renal origin: Secondary | ICD-10-CM | POA: Diagnosis not present

## 2020-04-07 DIAGNOSIS — N185 Chronic kidney disease, stage 5: Secondary | ICD-10-CM | POA: Diagnosis not present

## 2020-04-07 DIAGNOSIS — R2681 Unsteadiness on feet: Secondary | ICD-10-CM | POA: Diagnosis not present

## 2020-04-07 DIAGNOSIS — I69954 Hemiplegia and hemiparesis following unspecified cerebrovascular disease affecting left non-dominant side: Secondary | ICD-10-CM | POA: Diagnosis not present

## 2020-04-07 DIAGNOSIS — R262 Difficulty in walking, not elsewhere classified: Secondary | ICD-10-CM | POA: Diagnosis not present

## 2020-04-08 DIAGNOSIS — I69954 Hemiplegia and hemiparesis following unspecified cerebrovascular disease affecting left non-dominant side: Secondary | ICD-10-CM | POA: Diagnosis not present

## 2020-04-08 DIAGNOSIS — E119 Type 2 diabetes mellitus without complications: Secondary | ICD-10-CM | POA: Diagnosis not present

## 2020-04-08 DIAGNOSIS — R262 Difficulty in walking, not elsewhere classified: Secondary | ICD-10-CM | POA: Diagnosis not present

## 2020-04-08 DIAGNOSIS — R2681 Unsteadiness on feet: Secondary | ICD-10-CM | POA: Diagnosis not present

## 2020-04-08 DIAGNOSIS — M62442 Contracture of muscle, left hand: Secondary | ICD-10-CM | POA: Diagnosis not present

## 2020-04-08 DIAGNOSIS — M24562 Contracture, left knee: Secondary | ICD-10-CM | POA: Diagnosis not present

## 2020-04-08 DIAGNOSIS — M6281 Muscle weakness (generalized): Secondary | ICD-10-CM | POA: Diagnosis not present

## 2020-04-08 DIAGNOSIS — R279 Unspecified lack of coordination: Secondary | ICD-10-CM | POA: Diagnosis not present

## 2020-04-09 DIAGNOSIS — R279 Unspecified lack of coordination: Secondary | ICD-10-CM | POA: Diagnosis not present

## 2020-04-09 DIAGNOSIS — M24562 Contracture, left knee: Secondary | ICD-10-CM | POA: Diagnosis not present

## 2020-04-09 DIAGNOSIS — M62442 Contracture of muscle, left hand: Secondary | ICD-10-CM | POA: Diagnosis not present

## 2020-04-09 DIAGNOSIS — M6281 Muscle weakness (generalized): Secondary | ICD-10-CM | POA: Diagnosis not present

## 2020-04-09 DIAGNOSIS — R262 Difficulty in walking, not elsewhere classified: Secondary | ICD-10-CM | POA: Diagnosis not present

## 2020-04-09 DIAGNOSIS — E119 Type 2 diabetes mellitus without complications: Secondary | ICD-10-CM | POA: Diagnosis not present

## 2020-04-09 DIAGNOSIS — R2681 Unsteadiness on feet: Secondary | ICD-10-CM | POA: Diagnosis not present

## 2020-04-09 DIAGNOSIS — I69954 Hemiplegia and hemiparesis following unspecified cerebrovascular disease affecting left non-dominant side: Secondary | ICD-10-CM | POA: Diagnosis not present

## 2020-04-10 DIAGNOSIS — R2681 Unsteadiness on feet: Secondary | ICD-10-CM | POA: Diagnosis not present

## 2020-04-10 DIAGNOSIS — R262 Difficulty in walking, not elsewhere classified: Secondary | ICD-10-CM | POA: Diagnosis not present

## 2020-04-10 DIAGNOSIS — R279 Unspecified lack of coordination: Secondary | ICD-10-CM | POA: Diagnosis not present

## 2020-04-10 DIAGNOSIS — M6281 Muscle weakness (generalized): Secondary | ICD-10-CM | POA: Diagnosis not present

## 2020-04-10 DIAGNOSIS — E119 Type 2 diabetes mellitus without complications: Secondary | ICD-10-CM | POA: Diagnosis not present

## 2020-04-10 DIAGNOSIS — M24562 Contracture, left knee: Secondary | ICD-10-CM | POA: Diagnosis not present

## 2020-04-10 DIAGNOSIS — M62442 Contracture of muscle, left hand: Secondary | ICD-10-CM | POA: Diagnosis not present

## 2020-04-10 DIAGNOSIS — I69954 Hemiplegia and hemiparesis following unspecified cerebrovascular disease affecting left non-dominant side: Secondary | ICD-10-CM | POA: Diagnosis not present

## 2020-04-11 DIAGNOSIS — M24562 Contracture, left knee: Secondary | ICD-10-CM | POA: Diagnosis not present

## 2020-04-11 DIAGNOSIS — R2681 Unsteadiness on feet: Secondary | ICD-10-CM | POA: Diagnosis not present

## 2020-04-11 DIAGNOSIS — Z03818 Encounter for observation for suspected exposure to other biological agents ruled out: Secondary | ICD-10-CM | POA: Diagnosis not present

## 2020-04-11 DIAGNOSIS — M62442 Contracture of muscle, left hand: Secondary | ICD-10-CM | POA: Diagnosis not present

## 2020-04-11 DIAGNOSIS — R279 Unspecified lack of coordination: Secondary | ICD-10-CM | POA: Diagnosis not present

## 2020-04-11 DIAGNOSIS — I69954 Hemiplegia and hemiparesis following unspecified cerebrovascular disease affecting left non-dominant side: Secondary | ICD-10-CM | POA: Diagnosis not present

## 2020-04-11 DIAGNOSIS — E119 Type 2 diabetes mellitus without complications: Secondary | ICD-10-CM | POA: Diagnosis not present

## 2020-04-11 DIAGNOSIS — M6281 Muscle weakness (generalized): Secondary | ICD-10-CM | POA: Diagnosis not present

## 2020-04-11 DIAGNOSIS — R262 Difficulty in walking, not elsewhere classified: Secondary | ICD-10-CM | POA: Diagnosis not present

## 2020-04-12 DIAGNOSIS — R279 Unspecified lack of coordination: Secondary | ICD-10-CM | POA: Diagnosis not present

## 2020-04-12 DIAGNOSIS — R262 Difficulty in walking, not elsewhere classified: Secondary | ICD-10-CM | POA: Diagnosis not present

## 2020-04-12 DIAGNOSIS — E119 Type 2 diabetes mellitus without complications: Secondary | ICD-10-CM | POA: Diagnosis not present

## 2020-04-12 DIAGNOSIS — R2681 Unsteadiness on feet: Secondary | ICD-10-CM | POA: Diagnosis not present

## 2020-04-12 DIAGNOSIS — M24562 Contracture, left knee: Secondary | ICD-10-CM | POA: Diagnosis not present

## 2020-04-12 DIAGNOSIS — M6281 Muscle weakness (generalized): Secondary | ICD-10-CM | POA: Diagnosis not present

## 2020-04-12 DIAGNOSIS — I69954 Hemiplegia and hemiparesis following unspecified cerebrovascular disease affecting left non-dominant side: Secondary | ICD-10-CM | POA: Diagnosis not present

## 2020-04-12 DIAGNOSIS — M62442 Contracture of muscle, left hand: Secondary | ICD-10-CM | POA: Diagnosis not present

## 2020-04-13 DIAGNOSIS — R262 Difficulty in walking, not elsewhere classified: Secondary | ICD-10-CM | POA: Diagnosis not present

## 2020-04-13 DIAGNOSIS — R2681 Unsteadiness on feet: Secondary | ICD-10-CM | POA: Diagnosis not present

## 2020-04-13 DIAGNOSIS — M6281 Muscle weakness (generalized): Secondary | ICD-10-CM | POA: Diagnosis not present

## 2020-04-13 DIAGNOSIS — E119 Type 2 diabetes mellitus without complications: Secondary | ICD-10-CM | POA: Diagnosis not present

## 2020-04-13 DIAGNOSIS — I69954 Hemiplegia and hemiparesis following unspecified cerebrovascular disease affecting left non-dominant side: Secondary | ICD-10-CM | POA: Diagnosis not present

## 2020-04-13 DIAGNOSIS — M62442 Contracture of muscle, left hand: Secondary | ICD-10-CM | POA: Diagnosis not present

## 2020-04-13 DIAGNOSIS — R279 Unspecified lack of coordination: Secondary | ICD-10-CM | POA: Diagnosis not present

## 2020-04-13 DIAGNOSIS — M24562 Contracture, left knee: Secondary | ICD-10-CM | POA: Diagnosis not present

## 2020-04-14 DIAGNOSIS — M6281 Muscle weakness (generalized): Secondary | ICD-10-CM | POA: Diagnosis not present

## 2020-04-14 DIAGNOSIS — M24562 Contracture, left knee: Secondary | ICD-10-CM | POA: Diagnosis not present

## 2020-04-14 DIAGNOSIS — R279 Unspecified lack of coordination: Secondary | ICD-10-CM | POA: Diagnosis not present

## 2020-04-14 DIAGNOSIS — R2681 Unsteadiness on feet: Secondary | ICD-10-CM | POA: Diagnosis not present

## 2020-04-14 DIAGNOSIS — E119 Type 2 diabetes mellitus without complications: Secondary | ICD-10-CM | POA: Diagnosis not present

## 2020-04-14 DIAGNOSIS — M62442 Contracture of muscle, left hand: Secondary | ICD-10-CM | POA: Diagnosis not present

## 2020-04-14 DIAGNOSIS — I69954 Hemiplegia and hemiparesis following unspecified cerebrovascular disease affecting left non-dominant side: Secondary | ICD-10-CM | POA: Diagnosis not present

## 2020-04-14 DIAGNOSIS — R262 Difficulty in walking, not elsewhere classified: Secondary | ICD-10-CM | POA: Diagnosis not present

## 2020-04-15 DIAGNOSIS — R2681 Unsteadiness on feet: Secondary | ICD-10-CM | POA: Diagnosis not present

## 2020-04-15 DIAGNOSIS — M24562 Contracture, left knee: Secondary | ICD-10-CM | POA: Diagnosis not present

## 2020-04-15 DIAGNOSIS — M62442 Contracture of muscle, left hand: Secondary | ICD-10-CM | POA: Diagnosis not present

## 2020-04-15 DIAGNOSIS — I69954 Hemiplegia and hemiparesis following unspecified cerebrovascular disease affecting left non-dominant side: Secondary | ICD-10-CM | POA: Diagnosis not present

## 2020-04-15 DIAGNOSIS — R262 Difficulty in walking, not elsewhere classified: Secondary | ICD-10-CM | POA: Diagnosis not present

## 2020-04-15 DIAGNOSIS — E119 Type 2 diabetes mellitus without complications: Secondary | ICD-10-CM | POA: Diagnosis not present

## 2020-04-15 DIAGNOSIS — R279 Unspecified lack of coordination: Secondary | ICD-10-CM | POA: Diagnosis not present

## 2020-04-15 DIAGNOSIS — M6281 Muscle weakness (generalized): Secondary | ICD-10-CM | POA: Diagnosis not present

## 2020-04-16 ENCOUNTER — Other Ambulatory Visit: Payer: Self-pay

## 2020-04-16 ENCOUNTER — Non-Acute Institutional Stay: Payer: Medicare Other | Admitting: Adult Health Nurse Practitioner

## 2020-04-16 DIAGNOSIS — M24562 Contracture, left knee: Secondary | ICD-10-CM | POA: Diagnosis not present

## 2020-04-16 DIAGNOSIS — R2681 Unsteadiness on feet: Secondary | ICD-10-CM | POA: Diagnosis not present

## 2020-04-16 DIAGNOSIS — Z515 Encounter for palliative care: Secondary | ICD-10-CM | POA: Diagnosis not present

## 2020-04-16 DIAGNOSIS — L89616 Pressure-induced deep tissue damage of right heel: Secondary | ICD-10-CM | POA: Diagnosis not present

## 2020-04-16 DIAGNOSIS — M6281 Muscle weakness (generalized): Secondary | ICD-10-CM | POA: Diagnosis not present

## 2020-04-16 DIAGNOSIS — F0150 Vascular dementia without behavioral disturbance: Secondary | ICD-10-CM

## 2020-04-16 DIAGNOSIS — L89153 Pressure ulcer of sacral region, stage 3: Secondary | ICD-10-CM | POA: Diagnosis not present

## 2020-04-16 DIAGNOSIS — I69954 Hemiplegia and hemiparesis following unspecified cerebrovascular disease affecting left non-dominant side: Secondary | ICD-10-CM | POA: Diagnosis not present

## 2020-04-16 DIAGNOSIS — R262 Difficulty in walking, not elsewhere classified: Secondary | ICD-10-CM | POA: Diagnosis not present

## 2020-04-16 DIAGNOSIS — M62442 Contracture of muscle, left hand: Secondary | ICD-10-CM | POA: Diagnosis not present

## 2020-04-16 DIAGNOSIS — R279 Unspecified lack of coordination: Secondary | ICD-10-CM | POA: Diagnosis not present

## 2020-04-16 DIAGNOSIS — E119 Type 2 diabetes mellitus without complications: Secondary | ICD-10-CM | POA: Diagnosis not present

## 2020-04-16 DIAGNOSIS — L89626 Pressure-induced deep tissue damage of left heel: Secondary | ICD-10-CM | POA: Diagnosis not present

## 2020-04-16 NOTE — Progress Notes (Signed)
[place] Consult Note Telephone: [phone]  Fax: [phone]  PATIENT NAME: Chelsea Mcdaniel DOB: May 31, 1928 MRN: [id number]  PRIMARY CARE PROVIDER:  Dr. Maryella Shivers  REFERRING PROVIDER:  Roddie Mc PA  RESPONSIBLE PARTY:   Marleen Moret, DIL [phone] Yaniah Thiemann, son  [phone]    RECOMMENDATIONS and PLAN:  1.  Advanced care planning.  Patient is DNR.  Left VM with DIL as she is listed as primary contact.  Left contact info.  2.  Functional status.  Patient is wheelchair and bed bound.  Requires assistance with ADLS.  Is incontinent of B&B.  Patient sleepy during visit today but easily arousable. Continue supportive care at facility  3.  Nutritional status. Patient with fair appetite.  Patient losing weight. Weighed 131 pounds in March of this year and last recorded weight is 114.  Patient does have pressure wounds to bilateral heels and sacrum being followed by wound nurse.  Continue supportive care at facility.    Patient possibly eligible for hospice. She has had more than 10% weight loss over the past 6 months and has had decline in PPS from 50% in March this year to now 30%. Will continue to reach out to family to have this conversation.  Will follow up in 2-4 weeks.  I spent 30 minutes providing this consultation,  from 9:30 to 10:00 including time spent with patient/family, chart review, provider coordination, documentation. More than 50% of the time in this consultation was spent coordinating communication.   HISTORY OF PRESENT ILLNESS:  Chelsea Mcdaniel is a 86 y.o. year old female with multiple medical problems including dementia, CKD IV, DMT2, HTN, h/o CVA., has chronic DVT of right lower extremity and is on coumadin. Palliative Care was asked to help address goals of care. Patient was in ER on 12/31/19 for dehydration and on 01/02/20 for fall when she tipped out of her wheelchair and hit head.  She had hospital  stay from  6/10-6/15/21 for acute right occipital CVA.  Patient is not talkative today and HPI/ROS unobtainable.    CODE STATUS: DNR  PPS: 30% HOSPICE ELIGIBILITY/DIAGNOSIS: TBD  PHYSICAL EXAM:  HR 52  O2 90% on RA General: NAD, frail appearing, thin Cardiovascular: regular rate and rhythm Pulmonary: clear ant fields; normal respiratory effort Abdomen: soft, nontender, + bowel sounds GU: no suprapubic tenderness Extremities: no edema, no joint deformities Skin: no rashes on exposed skin Neurological: Weakness; patient alert but kept falling asleep during visit.  Easily arousable with voice    PAST MEDICAL HISTORY:  Past Medical History:  Diagnosis Date  . Anemia     normocytic anemia with baseline hemoglobin 10-11  . Blindness of left eye     likely related to stroke, left cataract removed from that eye with complications  . Calculus gallbladder and bile duct with cholecystitis with obstruction 01/2015  . Chronic kidney disease 2006    left renal artery stenosis with probable hemodynamic significance, kidneys are normal in morphology without focal lesions or hydronephrosis this is based but cannot A. of the abdomen with and without contrast done on the 31st 2006  . CKD (chronic kidney disease), stage III   . CVA (cerebral vascular accident) ([place]) 1990's  . CVA (cerebrovascular accident) [facility])  October 2007    CT of the head atrophy with multiple remote insults noted but no definite acute findings  . Diabetes mellitus without complication ([place])   . Glaucoma   . Gout   .  Hyperlipidemia   . Hypertension   . Personal history of DVT (deep vein thrombosis) 08/11/2010   BLE  . Stroke ([place])   . Type II diabetes mellitus ([place])     SOCIAL HX:  Social History   Tobacco Use  . Smoking status: Never Smoker  . Smokeless tobacco: Never Used  Substance Use Topics  . Alcohol use: Not Currently    ALLERGIES:  Allergies  Allergen Reactions  . Diltiazem Other (See Comments)     Symptomatic bradaycardia  . Diltiazem     unknown  . Metoprolol Other (See Comments)    Symptomatic bradycardia  . Metoprolol     unknown     PERTINENT MEDICATIONS:  Outpatient Encounter Medications as of 04/16/2020  Medication Sig  . Accu-Chek Softclix Lancets lancets Use to check blood sugar 3 times daily. DIAG CODE E11.9  . allopurinol (ZYLOPRIM) 100 MG tablet TAKE 1/2 TABLET BY MOUTH EVERY OTHER DAY  . amLODipine (NORVASC) 10 MG tablet Take 1 tablet (10 mg total) by mouth daily.  Marland Kitchen atorvastatin (LIPITOR) 40 MG tablet Take 1 tablet (40 mg total) by mouth daily.  . Blood Glucose Monitoring Suppl (ACCU-CHEK AVIVA PLUS) w/Device KIT Check blood sugar 1 time a day  . calcitRIOL (ROCALTROL) 0.25 MCG capsule Take 0.25 mcg by mouth daily.  . cloNIDine (CATAPRES - DOSED IN MG/24 HR) 0.3 mg/24hr patch APPLY 1 PATCH TO SKIN EVERY 7 DAYS  . dorzolamide-timolol (COSOPT) 22.3-6.8 MG/ML ophthalmic solution Place 1 drop into both eyes 2 (two) times daily.  . furosemide (LASIX) 10 MG/ML solution Take 8 mLs by mouth in the morning and at bedtime.  Marland Kitchen GAVILAX 17 GM/SCOOP powder Take 17 grams by mouth 2 times daily  . glucose blood (ACCU-CHEK AVIVA PLUS) test strip USE 1 TIME DAILY TO CHECK BLOOD SUGAR  . glucose blood (ACCU-CHEK AVIVA) test strip Use as instructed  . hydrALAZINE (APRESOLINE) 25 MG tablet Take 3 tablets (75 mg total) by mouth 3 (three) times daily.  Marland Kitchen JANUVIA 25 MG tablet TAKE 1 TABLET BY MOUTH EVERY DAY  . Lancets Misc. (ACCU-CHEK SOFTCLIX LANCET DEV) KIT Use to check blood sugar one time a day  . latanoprost (XALATAN) 0.005 % ophthalmic solution Place 1 drop into both eyes at bedtime.  Marland Kitchen LUMIGAN 0.01 % SOLN Place 1 drop into both eyes at bedtime.   . Melatonin 1 MG/ML LIQD TAKE 3.5 mls BY MOUTH AT BEDTIME AS NEEDED  . warfarin (COUMADIN) 5 MG tablet Take 5 mg by mouth daily.   No facility-administered encounter medications on file as of 04/16/2020.     Ivy Meriwether Jenetta Downer, NP

## 2020-04-17 DIAGNOSIS — M6281 Muscle weakness (generalized): Secondary | ICD-10-CM | POA: Diagnosis not present

## 2020-04-17 DIAGNOSIS — R2681 Unsteadiness on feet: Secondary | ICD-10-CM | POA: Diagnosis not present

## 2020-04-17 DIAGNOSIS — R262 Difficulty in walking, not elsewhere classified: Secondary | ICD-10-CM | POA: Diagnosis not present

## 2020-04-17 DIAGNOSIS — I69954 Hemiplegia and hemiparesis following unspecified cerebrovascular disease affecting left non-dominant side: Secondary | ICD-10-CM | POA: Diagnosis not present

## 2020-04-17 DIAGNOSIS — M62442 Contracture of muscle, left hand: Secondary | ICD-10-CM | POA: Diagnosis not present

## 2020-04-17 DIAGNOSIS — E119 Type 2 diabetes mellitus without complications: Secondary | ICD-10-CM | POA: Diagnosis not present

## 2020-04-17 DIAGNOSIS — M24562 Contracture, left knee: Secondary | ICD-10-CM | POA: Diagnosis not present

## 2020-04-17 DIAGNOSIS — R279 Unspecified lack of coordination: Secondary | ICD-10-CM | POA: Diagnosis not present

## 2020-04-18 DIAGNOSIS — R2681 Unsteadiness on feet: Secondary | ICD-10-CM | POA: Diagnosis not present

## 2020-04-18 DIAGNOSIS — E119 Type 2 diabetes mellitus without complications: Secondary | ICD-10-CM | POA: Diagnosis not present

## 2020-04-18 DIAGNOSIS — M62442 Contracture of muscle, left hand: Secondary | ICD-10-CM | POA: Diagnosis not present

## 2020-04-18 DIAGNOSIS — M24562 Contracture, left knee: Secondary | ICD-10-CM | POA: Diagnosis not present

## 2020-04-18 DIAGNOSIS — R279 Unspecified lack of coordination: Secondary | ICD-10-CM | POA: Diagnosis not present

## 2020-04-18 DIAGNOSIS — I69954 Hemiplegia and hemiparesis following unspecified cerebrovascular disease affecting left non-dominant side: Secondary | ICD-10-CM | POA: Diagnosis not present

## 2020-04-18 DIAGNOSIS — R262 Difficulty in walking, not elsewhere classified: Secondary | ICD-10-CM | POA: Diagnosis not present

## 2020-04-18 DIAGNOSIS — M6281 Muscle weakness (generalized): Secondary | ICD-10-CM | POA: Diagnosis not present

## 2020-04-19 DIAGNOSIS — R262 Difficulty in walking, not elsewhere classified: Secondary | ICD-10-CM | POA: Diagnosis not present

## 2020-04-19 DIAGNOSIS — M6281 Muscle weakness (generalized): Secondary | ICD-10-CM | POA: Diagnosis not present

## 2020-04-19 DIAGNOSIS — M24562 Contracture, left knee: Secondary | ICD-10-CM | POA: Diagnosis not present

## 2020-04-19 DIAGNOSIS — M62442 Contracture of muscle, left hand: Secondary | ICD-10-CM | POA: Diagnosis not present

## 2020-04-19 DIAGNOSIS — E119 Type 2 diabetes mellitus without complications: Secondary | ICD-10-CM | POA: Diagnosis not present

## 2020-04-19 DIAGNOSIS — I69954 Hemiplegia and hemiparesis following unspecified cerebrovascular disease affecting left non-dominant side: Secondary | ICD-10-CM | POA: Diagnosis not present

## 2020-04-19 DIAGNOSIS — R2681 Unsteadiness on feet: Secondary | ICD-10-CM | POA: Diagnosis not present

## 2020-04-19 DIAGNOSIS — R279 Unspecified lack of coordination: Secondary | ICD-10-CM | POA: Diagnosis not present

## 2020-04-20 DIAGNOSIS — I69954 Hemiplegia and hemiparesis following unspecified cerebrovascular disease affecting left non-dominant side: Secondary | ICD-10-CM | POA: Diagnosis not present

## 2020-04-20 DIAGNOSIS — M6281 Muscle weakness (generalized): Secondary | ICD-10-CM | POA: Diagnosis not present

## 2020-04-20 DIAGNOSIS — R2681 Unsteadiness on feet: Secondary | ICD-10-CM | POA: Diagnosis not present

## 2020-04-20 DIAGNOSIS — R279 Unspecified lack of coordination: Secondary | ICD-10-CM | POA: Diagnosis not present

## 2020-04-20 DIAGNOSIS — R262 Difficulty in walking, not elsewhere classified: Secondary | ICD-10-CM | POA: Diagnosis not present

## 2020-04-20 DIAGNOSIS — M24562 Contracture, left knee: Secondary | ICD-10-CM | POA: Diagnosis not present

## 2020-04-20 DIAGNOSIS — E119 Type 2 diabetes mellitus without complications: Secondary | ICD-10-CM | POA: Diagnosis not present

## 2020-04-20 DIAGNOSIS — M62442 Contracture of muscle, left hand: Secondary | ICD-10-CM | POA: Diagnosis not present

## 2020-04-21 DIAGNOSIS — R279 Unspecified lack of coordination: Secondary | ICD-10-CM | POA: Diagnosis not present

## 2020-04-21 DIAGNOSIS — R2681 Unsteadiness on feet: Secondary | ICD-10-CM | POA: Diagnosis not present

## 2020-04-21 DIAGNOSIS — E119 Type 2 diabetes mellitus without complications: Secondary | ICD-10-CM | POA: Diagnosis not present

## 2020-04-21 DIAGNOSIS — M6281 Muscle weakness (generalized): Secondary | ICD-10-CM | POA: Diagnosis not present

## 2020-04-21 DIAGNOSIS — M62442 Contracture of muscle, left hand: Secondary | ICD-10-CM | POA: Diagnosis not present

## 2020-04-21 DIAGNOSIS — I69954 Hemiplegia and hemiparesis following unspecified cerebrovascular disease affecting left non-dominant side: Secondary | ICD-10-CM | POA: Diagnosis not present

## 2020-04-21 DIAGNOSIS — M24562 Contracture, left knee: Secondary | ICD-10-CM | POA: Diagnosis not present

## 2020-04-21 DIAGNOSIS — R262 Difficulty in walking, not elsewhere classified: Secondary | ICD-10-CM | POA: Diagnosis not present

## 2020-04-22 DIAGNOSIS — R262 Difficulty in walking, not elsewhere classified: Secondary | ICD-10-CM | POA: Diagnosis not present

## 2020-04-22 DIAGNOSIS — M6281 Muscle weakness (generalized): Secondary | ICD-10-CM | POA: Diagnosis not present

## 2020-04-22 DIAGNOSIS — M24562 Contracture, left knee: Secondary | ICD-10-CM | POA: Diagnosis not present

## 2020-04-22 DIAGNOSIS — R279 Unspecified lack of coordination: Secondary | ICD-10-CM | POA: Diagnosis not present

## 2020-04-22 DIAGNOSIS — H1130 Conjunctival hemorrhage, unspecified eye: Secondary | ICD-10-CM | POA: Diagnosis not present

## 2020-04-22 DIAGNOSIS — E119 Type 2 diabetes mellitus without complications: Secondary | ICD-10-CM | POA: Diagnosis not present

## 2020-04-22 DIAGNOSIS — M62442 Contracture of muscle, left hand: Secondary | ICD-10-CM | POA: Diagnosis not present

## 2020-04-22 DIAGNOSIS — R609 Edema, unspecified: Secondary | ICD-10-CM | POA: Diagnosis not present

## 2020-04-22 DIAGNOSIS — R2681 Unsteadiness on feet: Secondary | ICD-10-CM | POA: Diagnosis not present

## 2020-04-22 DIAGNOSIS — I69954 Hemiplegia and hemiparesis following unspecified cerebrovascular disease affecting left non-dominant side: Secondary | ICD-10-CM | POA: Diagnosis not present

## 2020-04-23 DIAGNOSIS — I69954 Hemiplegia and hemiparesis following unspecified cerebrovascular disease affecting left non-dominant side: Secondary | ICD-10-CM | POA: Diagnosis not present

## 2020-04-23 DIAGNOSIS — R279 Unspecified lack of coordination: Secondary | ICD-10-CM | POA: Diagnosis not present

## 2020-04-23 DIAGNOSIS — R262 Difficulty in walking, not elsewhere classified: Secondary | ICD-10-CM | POA: Diagnosis not present

## 2020-04-23 DIAGNOSIS — M24562 Contracture, left knee: Secondary | ICD-10-CM | POA: Diagnosis not present

## 2020-04-23 DIAGNOSIS — R2681 Unsteadiness on feet: Secondary | ICD-10-CM | POA: Diagnosis not present

## 2020-04-23 DIAGNOSIS — M6281 Muscle weakness (generalized): Secondary | ICD-10-CM | POA: Diagnosis not present

## 2020-04-23 DIAGNOSIS — M62442 Contracture of muscle, left hand: Secondary | ICD-10-CM | POA: Diagnosis not present

## 2020-04-23 DIAGNOSIS — E119 Type 2 diabetes mellitus without complications: Secondary | ICD-10-CM | POA: Diagnosis not present

## 2020-04-24 DIAGNOSIS — M62442 Contracture of muscle, left hand: Secondary | ICD-10-CM | POA: Diagnosis not present

## 2020-04-24 DIAGNOSIS — I69954 Hemiplegia and hemiparesis following unspecified cerebrovascular disease affecting left non-dominant side: Secondary | ICD-10-CM | POA: Diagnosis not present

## 2020-04-24 DIAGNOSIS — E119 Type 2 diabetes mellitus without complications: Secondary | ICD-10-CM | POA: Diagnosis not present

## 2020-04-24 DIAGNOSIS — R262 Difficulty in walking, not elsewhere classified: Secondary | ICD-10-CM | POA: Diagnosis not present

## 2020-04-24 DIAGNOSIS — R2681 Unsteadiness on feet: Secondary | ICD-10-CM | POA: Diagnosis not present

## 2020-04-24 DIAGNOSIS — M24562 Contracture, left knee: Secondary | ICD-10-CM | POA: Diagnosis not present

## 2020-04-24 DIAGNOSIS — M6281 Muscle weakness (generalized): Secondary | ICD-10-CM | POA: Diagnosis not present

## 2020-04-24 DIAGNOSIS — R279 Unspecified lack of coordination: Secondary | ICD-10-CM | POA: Diagnosis not present

## 2020-04-25 DIAGNOSIS — M62442 Contracture of muscle, left hand: Secondary | ICD-10-CM | POA: Diagnosis not present

## 2020-04-25 DIAGNOSIS — M6281 Muscle weakness (generalized): Secondary | ICD-10-CM | POA: Diagnosis not present

## 2020-04-25 DIAGNOSIS — R279 Unspecified lack of coordination: Secondary | ICD-10-CM | POA: Diagnosis not present

## 2020-04-25 DIAGNOSIS — R1312 Dysphagia, oropharyngeal phase: Secondary | ICD-10-CM | POA: Diagnosis not present

## 2020-04-25 DIAGNOSIS — M2560 Stiffness of unspecified joint, not elsewhere classified: Secondary | ICD-10-CM | POA: Diagnosis not present

## 2020-04-25 DIAGNOSIS — M24562 Contracture, left knee: Secondary | ICD-10-CM | POA: Diagnosis not present

## 2020-04-25 DIAGNOSIS — I69954 Hemiplegia and hemiparesis following unspecified cerebrovascular disease affecting left non-dominant side: Secondary | ICD-10-CM | POA: Diagnosis not present

## 2020-04-25 DIAGNOSIS — Z03818 Encounter for observation for suspected exposure to other biological agents ruled out: Secondary | ICD-10-CM | POA: Diagnosis not present

## 2020-04-25 DIAGNOSIS — R1319 Other dysphagia: Secondary | ICD-10-CM | POA: Diagnosis not present

## 2020-04-25 DIAGNOSIS — R488 Other symbolic dysfunctions: Secondary | ICD-10-CM | POA: Diagnosis not present

## 2020-04-25 DIAGNOSIS — R2681 Unsteadiness on feet: Secondary | ICD-10-CM | POA: Diagnosis not present

## 2020-04-25 DIAGNOSIS — E119 Type 2 diabetes mellitus without complications: Secondary | ICD-10-CM | POA: Diagnosis not present

## 2020-04-25 DIAGNOSIS — R262 Difficulty in walking, not elsewhere classified: Secondary | ICD-10-CM | POA: Diagnosis not present

## 2020-04-26 DIAGNOSIS — I69954 Hemiplegia and hemiparesis following unspecified cerebrovascular disease affecting left non-dominant side: Secondary | ICD-10-CM | POA: Diagnosis not present

## 2020-04-26 DIAGNOSIS — M24562 Contracture, left knee: Secondary | ICD-10-CM | POA: Diagnosis not present

## 2020-04-26 DIAGNOSIS — M6281 Muscle weakness (generalized): Secondary | ICD-10-CM | POA: Diagnosis not present

## 2020-04-26 DIAGNOSIS — R279 Unspecified lack of coordination: Secondary | ICD-10-CM | POA: Diagnosis not present

## 2020-04-26 DIAGNOSIS — R1319 Other dysphagia: Secondary | ICD-10-CM | POA: Diagnosis not present

## 2020-04-26 DIAGNOSIS — R2681 Unsteadiness on feet: Secondary | ICD-10-CM | POA: Diagnosis not present

## 2020-04-26 DIAGNOSIS — R1312 Dysphagia, oropharyngeal phase: Secondary | ICD-10-CM | POA: Diagnosis not present

## 2020-04-26 DIAGNOSIS — M62442 Contracture of muscle, left hand: Secondary | ICD-10-CM | POA: Diagnosis not present

## 2020-04-26 DIAGNOSIS — E119 Type 2 diabetes mellitus without complications: Secondary | ICD-10-CM | POA: Diagnosis not present

## 2020-04-26 DIAGNOSIS — R488 Other symbolic dysfunctions: Secondary | ICD-10-CM | POA: Diagnosis not present

## 2020-04-26 DIAGNOSIS — M2560 Stiffness of unspecified joint, not elsewhere classified: Secondary | ICD-10-CM | POA: Diagnosis not present

## 2020-04-26 DIAGNOSIS — R262 Difficulty in walking, not elsewhere classified: Secondary | ICD-10-CM | POA: Diagnosis not present

## 2020-04-27 DIAGNOSIS — R1312 Dysphagia, oropharyngeal phase: Secondary | ICD-10-CM | POA: Diagnosis not present

## 2020-04-27 DIAGNOSIS — M24562 Contracture, left knee: Secondary | ICD-10-CM | POA: Diagnosis not present

## 2020-04-27 DIAGNOSIS — R279 Unspecified lack of coordination: Secondary | ICD-10-CM | POA: Diagnosis not present

## 2020-04-27 DIAGNOSIS — M2560 Stiffness of unspecified joint, not elsewhere classified: Secondary | ICD-10-CM | POA: Diagnosis not present

## 2020-04-27 DIAGNOSIS — R1319 Other dysphagia: Secondary | ICD-10-CM | POA: Diagnosis not present

## 2020-04-27 DIAGNOSIS — I69954 Hemiplegia and hemiparesis following unspecified cerebrovascular disease affecting left non-dominant side: Secondary | ICD-10-CM | POA: Diagnosis not present

## 2020-04-27 DIAGNOSIS — E119 Type 2 diabetes mellitus without complications: Secondary | ICD-10-CM | POA: Diagnosis not present

## 2020-04-27 DIAGNOSIS — M62442 Contracture of muscle, left hand: Secondary | ICD-10-CM | POA: Diagnosis not present

## 2020-04-27 DIAGNOSIS — R262 Difficulty in walking, not elsewhere classified: Secondary | ICD-10-CM | POA: Diagnosis not present

## 2020-04-27 DIAGNOSIS — R488 Other symbolic dysfunctions: Secondary | ICD-10-CM | POA: Diagnosis not present

## 2020-04-27 DIAGNOSIS — R2681 Unsteadiness on feet: Secondary | ICD-10-CM | POA: Diagnosis not present

## 2020-04-27 DIAGNOSIS — M6281 Muscle weakness (generalized): Secondary | ICD-10-CM | POA: Diagnosis not present

## 2020-04-28 DIAGNOSIS — R1319 Other dysphagia: Secondary | ICD-10-CM | POA: Diagnosis not present

## 2020-04-28 DIAGNOSIS — R279 Unspecified lack of coordination: Secondary | ICD-10-CM | POA: Diagnosis not present

## 2020-04-28 DIAGNOSIS — M6281 Muscle weakness (generalized): Secondary | ICD-10-CM | POA: Diagnosis not present

## 2020-04-28 DIAGNOSIS — M62442 Contracture of muscle, left hand: Secondary | ICD-10-CM | POA: Diagnosis not present

## 2020-04-28 DIAGNOSIS — M2560 Stiffness of unspecified joint, not elsewhere classified: Secondary | ICD-10-CM | POA: Diagnosis not present

## 2020-04-28 DIAGNOSIS — E119 Type 2 diabetes mellitus without complications: Secondary | ICD-10-CM | POA: Diagnosis not present

## 2020-04-28 DIAGNOSIS — I69954 Hemiplegia and hemiparesis following unspecified cerebrovascular disease affecting left non-dominant side: Secondary | ICD-10-CM | POA: Diagnosis not present

## 2020-04-28 DIAGNOSIS — R1312 Dysphagia, oropharyngeal phase: Secondary | ICD-10-CM | POA: Diagnosis not present

## 2020-04-28 DIAGNOSIS — M24562 Contracture, left knee: Secondary | ICD-10-CM | POA: Diagnosis not present

## 2020-04-28 DIAGNOSIS — R262 Difficulty in walking, not elsewhere classified: Secondary | ICD-10-CM | POA: Diagnosis not present

## 2020-04-28 DIAGNOSIS — R2681 Unsteadiness on feet: Secondary | ICD-10-CM | POA: Diagnosis not present

## 2020-04-28 DIAGNOSIS — R488 Other symbolic dysfunctions: Secondary | ICD-10-CM | POA: Diagnosis not present

## 2020-04-29 DIAGNOSIS — R1319 Other dysphagia: Secondary | ICD-10-CM | POA: Diagnosis not present

## 2020-04-29 DIAGNOSIS — I69954 Hemiplegia and hemiparesis following unspecified cerebrovascular disease affecting left non-dominant side: Secondary | ICD-10-CM | POA: Diagnosis not present

## 2020-04-29 DIAGNOSIS — M24562 Contracture, left knee: Secondary | ICD-10-CM | POA: Diagnosis not present

## 2020-04-29 DIAGNOSIS — E119 Type 2 diabetes mellitus without complications: Secondary | ICD-10-CM | POA: Diagnosis not present

## 2020-04-29 DIAGNOSIS — R279 Unspecified lack of coordination: Secondary | ICD-10-CM | POA: Diagnosis not present

## 2020-04-29 DIAGNOSIS — R488 Other symbolic dysfunctions: Secondary | ICD-10-CM | POA: Diagnosis not present

## 2020-04-29 DIAGNOSIS — R1312 Dysphagia, oropharyngeal phase: Secondary | ICD-10-CM | POA: Diagnosis not present

## 2020-04-29 DIAGNOSIS — M6281 Muscle weakness (generalized): Secondary | ICD-10-CM | POA: Diagnosis not present

## 2020-04-29 DIAGNOSIS — R262 Difficulty in walking, not elsewhere classified: Secondary | ICD-10-CM | POA: Diagnosis not present

## 2020-04-29 DIAGNOSIS — M62442 Contracture of muscle, left hand: Secondary | ICD-10-CM | POA: Diagnosis not present

## 2020-04-29 DIAGNOSIS — R2681 Unsteadiness on feet: Secondary | ICD-10-CM | POA: Diagnosis not present

## 2020-04-29 DIAGNOSIS — M2560 Stiffness of unspecified joint, not elsewhere classified: Secondary | ICD-10-CM | POA: Diagnosis not present

## 2020-04-30 DIAGNOSIS — E119 Type 2 diabetes mellitus without complications: Secondary | ICD-10-CM | POA: Diagnosis not present

## 2020-04-30 DIAGNOSIS — R279 Unspecified lack of coordination: Secondary | ICD-10-CM | POA: Diagnosis not present

## 2020-04-30 DIAGNOSIS — R488 Other symbolic dysfunctions: Secondary | ICD-10-CM | POA: Diagnosis not present

## 2020-04-30 DIAGNOSIS — M2560 Stiffness of unspecified joint, not elsewhere classified: Secondary | ICD-10-CM | POA: Diagnosis not present

## 2020-04-30 DIAGNOSIS — M24562 Contracture, left knee: Secondary | ICD-10-CM | POA: Diagnosis not present

## 2020-04-30 DIAGNOSIS — M62442 Contracture of muscle, left hand: Secondary | ICD-10-CM | POA: Diagnosis not present

## 2020-04-30 DIAGNOSIS — R1312 Dysphagia, oropharyngeal phase: Secondary | ICD-10-CM | POA: Diagnosis not present

## 2020-04-30 DIAGNOSIS — M6281 Muscle weakness (generalized): Secondary | ICD-10-CM | POA: Diagnosis not present

## 2020-04-30 DIAGNOSIS — I69954 Hemiplegia and hemiparesis following unspecified cerebrovascular disease affecting left non-dominant side: Secondary | ICD-10-CM | POA: Diagnosis not present

## 2020-04-30 DIAGNOSIS — R1319 Other dysphagia: Secondary | ICD-10-CM | POA: Diagnosis not present

## 2020-04-30 DIAGNOSIS — R2681 Unsteadiness on feet: Secondary | ICD-10-CM | POA: Diagnosis not present

## 2020-04-30 DIAGNOSIS — R262 Difficulty in walking, not elsewhere classified: Secondary | ICD-10-CM | POA: Diagnosis not present

## 2020-05-01 DIAGNOSIS — R279 Unspecified lack of coordination: Secondary | ICD-10-CM | POA: Diagnosis not present

## 2020-05-01 DIAGNOSIS — R1319 Other dysphagia: Secondary | ICD-10-CM | POA: Diagnosis not present

## 2020-05-01 DIAGNOSIS — R262 Difficulty in walking, not elsewhere classified: Secondary | ICD-10-CM | POA: Diagnosis not present

## 2020-05-01 DIAGNOSIS — M62442 Contracture of muscle, left hand: Secondary | ICD-10-CM | POA: Diagnosis not present

## 2020-05-01 DIAGNOSIS — R2681 Unsteadiness on feet: Secondary | ICD-10-CM | POA: Diagnosis not present

## 2020-05-01 DIAGNOSIS — M6281 Muscle weakness (generalized): Secondary | ICD-10-CM | POA: Diagnosis not present

## 2020-05-01 DIAGNOSIS — R488 Other symbolic dysfunctions: Secondary | ICD-10-CM | POA: Diagnosis not present

## 2020-05-01 DIAGNOSIS — E119 Type 2 diabetes mellitus without complications: Secondary | ICD-10-CM | POA: Diagnosis not present

## 2020-05-01 DIAGNOSIS — M2560 Stiffness of unspecified joint, not elsewhere classified: Secondary | ICD-10-CM | POA: Diagnosis not present

## 2020-05-01 DIAGNOSIS — I69954 Hemiplegia and hemiparesis following unspecified cerebrovascular disease affecting left non-dominant side: Secondary | ICD-10-CM | POA: Diagnosis not present

## 2020-05-01 DIAGNOSIS — R1312 Dysphagia, oropharyngeal phase: Secondary | ICD-10-CM | POA: Diagnosis not present

## 2020-05-01 DIAGNOSIS — M24562 Contracture, left knee: Secondary | ICD-10-CM | POA: Diagnosis not present

## 2020-05-02 DIAGNOSIS — M24562 Contracture, left knee: Secondary | ICD-10-CM | POA: Diagnosis not present

## 2020-05-02 DIAGNOSIS — R1319 Other dysphagia: Secondary | ICD-10-CM | POA: Diagnosis not present

## 2020-05-02 DIAGNOSIS — R262 Difficulty in walking, not elsewhere classified: Secondary | ICD-10-CM | POA: Diagnosis not present

## 2020-05-02 DIAGNOSIS — R1312 Dysphagia, oropharyngeal phase: Secondary | ICD-10-CM | POA: Diagnosis not present

## 2020-05-02 DIAGNOSIS — R279 Unspecified lack of coordination: Secondary | ICD-10-CM | POA: Diagnosis not present

## 2020-05-02 DIAGNOSIS — I69954 Hemiplegia and hemiparesis following unspecified cerebrovascular disease affecting left non-dominant side: Secondary | ICD-10-CM | POA: Diagnosis not present

## 2020-05-02 DIAGNOSIS — R488 Other symbolic dysfunctions: Secondary | ICD-10-CM | POA: Diagnosis not present

## 2020-05-02 DIAGNOSIS — E119 Type 2 diabetes mellitus without complications: Secondary | ICD-10-CM | POA: Diagnosis not present

## 2020-05-02 DIAGNOSIS — M6281 Muscle weakness (generalized): Secondary | ICD-10-CM | POA: Diagnosis not present

## 2020-05-02 DIAGNOSIS — M2560 Stiffness of unspecified joint, not elsewhere classified: Secondary | ICD-10-CM | POA: Diagnosis not present

## 2020-05-02 DIAGNOSIS — M62442 Contracture of muscle, left hand: Secondary | ICD-10-CM | POA: Diagnosis not present

## 2020-05-02 DIAGNOSIS — R2681 Unsteadiness on feet: Secondary | ICD-10-CM | POA: Diagnosis not present

## 2020-05-03 DIAGNOSIS — M2560 Stiffness of unspecified joint, not elsewhere classified: Secondary | ICD-10-CM | POA: Diagnosis not present

## 2020-05-03 DIAGNOSIS — R262 Difficulty in walking, not elsewhere classified: Secondary | ICD-10-CM | POA: Diagnosis not present

## 2020-05-03 DIAGNOSIS — M24562 Contracture, left knee: Secondary | ICD-10-CM | POA: Diagnosis not present

## 2020-05-03 DIAGNOSIS — M62442 Contracture of muscle, left hand: Secondary | ICD-10-CM | POA: Diagnosis not present

## 2020-05-03 DIAGNOSIS — E119 Type 2 diabetes mellitus without complications: Secondary | ICD-10-CM | POA: Diagnosis not present

## 2020-05-03 DIAGNOSIS — R2681 Unsteadiness on feet: Secondary | ICD-10-CM | POA: Diagnosis not present

## 2020-05-03 DIAGNOSIS — R488 Other symbolic dysfunctions: Secondary | ICD-10-CM | POA: Diagnosis not present

## 2020-05-03 DIAGNOSIS — M6281 Muscle weakness (generalized): Secondary | ICD-10-CM | POA: Diagnosis not present

## 2020-05-03 DIAGNOSIS — R1312 Dysphagia, oropharyngeal phase: Secondary | ICD-10-CM | POA: Diagnosis not present

## 2020-05-03 DIAGNOSIS — I69954 Hemiplegia and hemiparesis following unspecified cerebrovascular disease affecting left non-dominant side: Secondary | ICD-10-CM | POA: Diagnosis not present

## 2020-05-03 DIAGNOSIS — R279 Unspecified lack of coordination: Secondary | ICD-10-CM | POA: Diagnosis not present

## 2020-05-03 DIAGNOSIS — R1319 Other dysphagia: Secondary | ICD-10-CM | POA: Diagnosis not present

## 2020-05-04 DIAGNOSIS — M24562 Contracture, left knee: Secondary | ICD-10-CM | POA: Diagnosis not present

## 2020-05-04 DIAGNOSIS — I69954 Hemiplegia and hemiparesis following unspecified cerebrovascular disease affecting left non-dominant side: Secondary | ICD-10-CM | POA: Diagnosis not present

## 2020-05-04 DIAGNOSIS — R1312 Dysphagia, oropharyngeal phase: Secondary | ICD-10-CM | POA: Diagnosis not present

## 2020-05-04 DIAGNOSIS — M2560 Stiffness of unspecified joint, not elsewhere classified: Secondary | ICD-10-CM | POA: Diagnosis not present

## 2020-05-04 DIAGNOSIS — E119 Type 2 diabetes mellitus without complications: Secondary | ICD-10-CM | POA: Diagnosis not present

## 2020-05-04 DIAGNOSIS — R1319 Other dysphagia: Secondary | ICD-10-CM | POA: Diagnosis not present

## 2020-05-04 DIAGNOSIS — R262 Difficulty in walking, not elsewhere classified: Secondary | ICD-10-CM | POA: Diagnosis not present

## 2020-05-04 DIAGNOSIS — M6281 Muscle weakness (generalized): Secondary | ICD-10-CM | POA: Diagnosis not present

## 2020-05-04 DIAGNOSIS — M62442 Contracture of muscle, left hand: Secondary | ICD-10-CM | POA: Diagnosis not present

## 2020-05-04 DIAGNOSIS — R2681 Unsteadiness on feet: Secondary | ICD-10-CM | POA: Diagnosis not present

## 2020-05-04 DIAGNOSIS — R279 Unspecified lack of coordination: Secondary | ICD-10-CM | POA: Diagnosis not present

## 2020-05-04 DIAGNOSIS — R488 Other symbolic dysfunctions: Secondary | ICD-10-CM | POA: Diagnosis not present

## 2020-05-05 DIAGNOSIS — R1319 Other dysphagia: Secondary | ICD-10-CM | POA: Diagnosis not present

## 2020-05-05 DIAGNOSIS — R2681 Unsteadiness on feet: Secondary | ICD-10-CM | POA: Diagnosis not present

## 2020-05-05 DIAGNOSIS — R488 Other symbolic dysfunctions: Secondary | ICD-10-CM | POA: Diagnosis not present

## 2020-05-05 DIAGNOSIS — E119 Type 2 diabetes mellitus without complications: Secondary | ICD-10-CM | POA: Diagnosis not present

## 2020-05-05 DIAGNOSIS — R262 Difficulty in walking, not elsewhere classified: Secondary | ICD-10-CM | POA: Diagnosis not present

## 2020-05-05 DIAGNOSIS — M2560 Stiffness of unspecified joint, not elsewhere classified: Secondary | ICD-10-CM | POA: Diagnosis not present

## 2020-05-05 DIAGNOSIS — R1312 Dysphagia, oropharyngeal phase: Secondary | ICD-10-CM | POA: Diagnosis not present

## 2020-05-05 DIAGNOSIS — M24562 Contracture, left knee: Secondary | ICD-10-CM | POA: Diagnosis not present

## 2020-05-05 DIAGNOSIS — I69954 Hemiplegia and hemiparesis following unspecified cerebrovascular disease affecting left non-dominant side: Secondary | ICD-10-CM | POA: Diagnosis not present

## 2020-05-05 DIAGNOSIS — R279 Unspecified lack of coordination: Secondary | ICD-10-CM | POA: Diagnosis not present

## 2020-05-05 DIAGNOSIS — M62442 Contracture of muscle, left hand: Secondary | ICD-10-CM | POA: Diagnosis not present

## 2020-05-05 DIAGNOSIS — M6281 Muscle weakness (generalized): Secondary | ICD-10-CM | POA: Diagnosis not present

## 2020-05-06 DIAGNOSIS — R262 Difficulty in walking, not elsewhere classified: Secondary | ICD-10-CM | POA: Diagnosis not present

## 2020-05-06 DIAGNOSIS — R2681 Unsteadiness on feet: Secondary | ICD-10-CM | POA: Diagnosis not present

## 2020-05-06 DIAGNOSIS — M24562 Contracture, left knee: Secondary | ICD-10-CM | POA: Diagnosis not present

## 2020-05-06 DIAGNOSIS — I69954 Hemiplegia and hemiparesis following unspecified cerebrovascular disease affecting left non-dominant side: Secondary | ICD-10-CM | POA: Diagnosis not present

## 2020-05-06 DIAGNOSIS — M2560 Stiffness of unspecified joint, not elsewhere classified: Secondary | ICD-10-CM | POA: Diagnosis not present

## 2020-05-06 DIAGNOSIS — R488 Other symbolic dysfunctions: Secondary | ICD-10-CM | POA: Diagnosis not present

## 2020-05-06 DIAGNOSIS — M6281 Muscle weakness (generalized): Secondary | ICD-10-CM | POA: Diagnosis not present

## 2020-05-06 DIAGNOSIS — R1319 Other dysphagia: Secondary | ICD-10-CM | POA: Diagnosis not present

## 2020-05-06 DIAGNOSIS — E119 Type 2 diabetes mellitus without complications: Secondary | ICD-10-CM | POA: Diagnosis not present

## 2020-05-06 DIAGNOSIS — R279 Unspecified lack of coordination: Secondary | ICD-10-CM | POA: Diagnosis not present

## 2020-05-06 DIAGNOSIS — R1312 Dysphagia, oropharyngeal phase: Secondary | ICD-10-CM | POA: Diagnosis not present

## 2020-05-06 DIAGNOSIS — M62442 Contracture of muscle, left hand: Secondary | ICD-10-CM | POA: Diagnosis not present

## 2020-05-07 DIAGNOSIS — E119 Type 2 diabetes mellitus without complications: Secondary | ICD-10-CM | POA: Diagnosis not present

## 2020-05-07 DIAGNOSIS — M6281 Muscle weakness (generalized): Secondary | ICD-10-CM | POA: Diagnosis not present

## 2020-05-07 DIAGNOSIS — R2681 Unsteadiness on feet: Secondary | ICD-10-CM | POA: Diagnosis not present

## 2020-05-07 DIAGNOSIS — I69954 Hemiplegia and hemiparesis following unspecified cerebrovascular disease affecting left non-dominant side: Secondary | ICD-10-CM | POA: Diagnosis not present

## 2020-05-07 DIAGNOSIS — M62442 Contracture of muscle, left hand: Secondary | ICD-10-CM | POA: Diagnosis not present

## 2020-05-07 DIAGNOSIS — R262 Difficulty in walking, not elsewhere classified: Secondary | ICD-10-CM | POA: Diagnosis not present

## 2020-05-07 DIAGNOSIS — R1312 Dysphagia, oropharyngeal phase: Secondary | ICD-10-CM | POA: Diagnosis not present

## 2020-05-07 DIAGNOSIS — R1319 Other dysphagia: Secondary | ICD-10-CM | POA: Diagnosis not present

## 2020-05-07 DIAGNOSIS — R279 Unspecified lack of coordination: Secondary | ICD-10-CM | POA: Diagnosis not present

## 2020-05-07 DIAGNOSIS — M24562 Contracture, left knee: Secondary | ICD-10-CM | POA: Diagnosis not present

## 2020-05-07 DIAGNOSIS — M2560 Stiffness of unspecified joint, not elsewhere classified: Secondary | ICD-10-CM | POA: Diagnosis not present

## 2020-05-07 DIAGNOSIS — R488 Other symbolic dysfunctions: Secondary | ICD-10-CM | POA: Diagnosis not present

## 2020-05-08 DIAGNOSIS — M6281 Muscle weakness (generalized): Secondary | ICD-10-CM | POA: Diagnosis not present

## 2020-05-08 DIAGNOSIS — I69954 Hemiplegia and hemiparesis following unspecified cerebrovascular disease affecting left non-dominant side: Secondary | ICD-10-CM | POA: Diagnosis not present

## 2020-05-08 DIAGNOSIS — M24562 Contracture, left knee: Secondary | ICD-10-CM | POA: Diagnosis not present

## 2020-05-08 DIAGNOSIS — R1319 Other dysphagia: Secondary | ICD-10-CM | POA: Diagnosis not present

## 2020-05-08 DIAGNOSIS — M62442 Contracture of muscle, left hand: Secondary | ICD-10-CM | POA: Diagnosis not present

## 2020-05-08 DIAGNOSIS — R1312 Dysphagia, oropharyngeal phase: Secondary | ICD-10-CM | POA: Diagnosis not present

## 2020-05-08 DIAGNOSIS — R488 Other symbolic dysfunctions: Secondary | ICD-10-CM | POA: Diagnosis not present

## 2020-05-08 DIAGNOSIS — M2560 Stiffness of unspecified joint, not elsewhere classified: Secondary | ICD-10-CM | POA: Diagnosis not present

## 2020-05-08 DIAGNOSIS — R262 Difficulty in walking, not elsewhere classified: Secondary | ICD-10-CM | POA: Diagnosis not present

## 2020-05-08 DIAGNOSIS — R279 Unspecified lack of coordination: Secondary | ICD-10-CM | POA: Diagnosis not present

## 2020-05-08 DIAGNOSIS — E119 Type 2 diabetes mellitus without complications: Secondary | ICD-10-CM | POA: Diagnosis not present

## 2020-05-08 DIAGNOSIS — R2681 Unsteadiness on feet: Secondary | ICD-10-CM | POA: Diagnosis not present

## 2020-05-09 DIAGNOSIS — I69954 Hemiplegia and hemiparesis following unspecified cerebrovascular disease affecting left non-dominant side: Secondary | ICD-10-CM | POA: Diagnosis not present

## 2020-05-09 DIAGNOSIS — R1319 Other dysphagia: Secondary | ICD-10-CM | POA: Diagnosis not present

## 2020-05-09 DIAGNOSIS — R1312 Dysphagia, oropharyngeal phase: Secondary | ICD-10-CM | POA: Diagnosis not present

## 2020-05-09 DIAGNOSIS — M6281 Muscle weakness (generalized): Secondary | ICD-10-CM | POA: Diagnosis not present

## 2020-05-09 DIAGNOSIS — M2560 Stiffness of unspecified joint, not elsewhere classified: Secondary | ICD-10-CM | POA: Diagnosis not present

## 2020-05-09 DIAGNOSIS — R488 Other symbolic dysfunctions: Secondary | ICD-10-CM | POA: Diagnosis not present

## 2020-05-09 DIAGNOSIS — R2681 Unsteadiness on feet: Secondary | ICD-10-CM | POA: Diagnosis not present

## 2020-05-09 DIAGNOSIS — R279 Unspecified lack of coordination: Secondary | ICD-10-CM | POA: Diagnosis not present

## 2020-05-09 DIAGNOSIS — R262 Difficulty in walking, not elsewhere classified: Secondary | ICD-10-CM | POA: Diagnosis not present

## 2020-05-09 DIAGNOSIS — M24562 Contracture, left knee: Secondary | ICD-10-CM | POA: Diagnosis not present

## 2020-05-09 DIAGNOSIS — M62442 Contracture of muscle, left hand: Secondary | ICD-10-CM | POA: Diagnosis not present

## 2020-05-09 DIAGNOSIS — Z03818 Encounter for observation for suspected exposure to other biological agents ruled out: Secondary | ICD-10-CM | POA: Diagnosis not present

## 2020-05-09 DIAGNOSIS — E119 Type 2 diabetes mellitus without complications: Secondary | ICD-10-CM | POA: Diagnosis not present

## 2020-05-10 DIAGNOSIS — I69954 Hemiplegia and hemiparesis following unspecified cerebrovascular disease affecting left non-dominant side: Secondary | ICD-10-CM | POA: Diagnosis not present

## 2020-05-10 DIAGNOSIS — E119 Type 2 diabetes mellitus without complications: Secondary | ICD-10-CM | POA: Diagnosis not present

## 2020-05-10 DIAGNOSIS — R488 Other symbolic dysfunctions: Secondary | ICD-10-CM | POA: Diagnosis not present

## 2020-05-10 DIAGNOSIS — R1312 Dysphagia, oropharyngeal phase: Secondary | ICD-10-CM | POA: Diagnosis not present

## 2020-05-10 DIAGNOSIS — M24562 Contracture, left knee: Secondary | ICD-10-CM | POA: Diagnosis not present

## 2020-05-10 DIAGNOSIS — M6281 Muscle weakness (generalized): Secondary | ICD-10-CM | POA: Diagnosis not present

## 2020-05-10 DIAGNOSIS — R1319 Other dysphagia: Secondary | ICD-10-CM | POA: Diagnosis not present

## 2020-05-10 DIAGNOSIS — M2560 Stiffness of unspecified joint, not elsewhere classified: Secondary | ICD-10-CM | POA: Diagnosis not present

## 2020-05-10 DIAGNOSIS — R262 Difficulty in walking, not elsewhere classified: Secondary | ICD-10-CM | POA: Diagnosis not present

## 2020-05-10 DIAGNOSIS — R279 Unspecified lack of coordination: Secondary | ICD-10-CM | POA: Diagnosis not present

## 2020-05-10 DIAGNOSIS — R2681 Unsteadiness on feet: Secondary | ICD-10-CM | POA: Diagnosis not present

## 2020-05-10 DIAGNOSIS — M62442 Contracture of muscle, left hand: Secondary | ICD-10-CM | POA: Diagnosis not present

## 2020-05-11 DIAGNOSIS — M24562 Contracture, left knee: Secondary | ICD-10-CM | POA: Diagnosis not present

## 2020-05-11 DIAGNOSIS — I69954 Hemiplegia and hemiparesis following unspecified cerebrovascular disease affecting left non-dominant side: Secondary | ICD-10-CM | POA: Diagnosis not present

## 2020-05-11 DIAGNOSIS — R1319 Other dysphagia: Secondary | ICD-10-CM | POA: Diagnosis not present

## 2020-05-11 DIAGNOSIS — R488 Other symbolic dysfunctions: Secondary | ICD-10-CM | POA: Diagnosis not present

## 2020-05-11 DIAGNOSIS — R1312 Dysphagia, oropharyngeal phase: Secondary | ICD-10-CM | POA: Diagnosis not present

## 2020-05-11 DIAGNOSIS — R2681 Unsteadiness on feet: Secondary | ICD-10-CM | POA: Diagnosis not present

## 2020-05-11 DIAGNOSIS — R279 Unspecified lack of coordination: Secondary | ICD-10-CM | POA: Diagnosis not present

## 2020-05-11 DIAGNOSIS — M6281 Muscle weakness (generalized): Secondary | ICD-10-CM | POA: Diagnosis not present

## 2020-05-11 DIAGNOSIS — M2560 Stiffness of unspecified joint, not elsewhere classified: Secondary | ICD-10-CM | POA: Diagnosis not present

## 2020-05-11 DIAGNOSIS — M62442 Contracture of muscle, left hand: Secondary | ICD-10-CM | POA: Diagnosis not present

## 2020-05-11 DIAGNOSIS — E119 Type 2 diabetes mellitus without complications: Secondary | ICD-10-CM | POA: Diagnosis not present

## 2020-05-11 DIAGNOSIS — R262 Difficulty in walking, not elsewhere classified: Secondary | ICD-10-CM | POA: Diagnosis not present

## 2020-05-12 DIAGNOSIS — M6281 Muscle weakness (generalized): Secondary | ICD-10-CM | POA: Diagnosis not present

## 2020-05-12 DIAGNOSIS — R2681 Unsteadiness on feet: Secondary | ICD-10-CM | POA: Diagnosis not present

## 2020-05-12 DIAGNOSIS — R279 Unspecified lack of coordination: Secondary | ICD-10-CM | POA: Diagnosis not present

## 2020-05-12 DIAGNOSIS — E119 Type 2 diabetes mellitus without complications: Secondary | ICD-10-CM | POA: Diagnosis not present

## 2020-05-12 DIAGNOSIS — R1319 Other dysphagia: Secondary | ICD-10-CM | POA: Diagnosis not present

## 2020-05-12 DIAGNOSIS — I69954 Hemiplegia and hemiparesis following unspecified cerebrovascular disease affecting left non-dominant side: Secondary | ICD-10-CM | POA: Diagnosis not present

## 2020-05-12 DIAGNOSIS — R488 Other symbolic dysfunctions: Secondary | ICD-10-CM | POA: Diagnosis not present

## 2020-05-12 DIAGNOSIS — M2560 Stiffness of unspecified joint, not elsewhere classified: Secondary | ICD-10-CM | POA: Diagnosis not present

## 2020-05-12 DIAGNOSIS — R262 Difficulty in walking, not elsewhere classified: Secondary | ICD-10-CM | POA: Diagnosis not present

## 2020-05-12 DIAGNOSIS — D508 Other iron deficiency anemias: Secondary | ICD-10-CM | POA: Diagnosis not present

## 2020-05-12 DIAGNOSIS — R1312 Dysphagia, oropharyngeal phase: Secondary | ICD-10-CM | POA: Diagnosis not present

## 2020-05-12 DIAGNOSIS — M62442 Contracture of muscle, left hand: Secondary | ICD-10-CM | POA: Diagnosis not present

## 2020-05-12 DIAGNOSIS — M24562 Contracture, left knee: Secondary | ICD-10-CM | POA: Diagnosis not present

## 2020-05-12 DIAGNOSIS — I110 Hypertensive heart disease with heart failure: Secondary | ICD-10-CM | POA: Diagnosis not present

## 2020-05-12 DIAGNOSIS — L89626 Pressure-induced deep tissue damage of left heel: Secondary | ICD-10-CM | POA: Diagnosis not present

## 2020-05-13 DIAGNOSIS — R262 Difficulty in walking, not elsewhere classified: Secondary | ICD-10-CM | POA: Diagnosis not present

## 2020-05-13 DIAGNOSIS — R1319 Other dysphagia: Secondary | ICD-10-CM | POA: Diagnosis not present

## 2020-05-13 DIAGNOSIS — I69954 Hemiplegia and hemiparesis following unspecified cerebrovascular disease affecting left non-dominant side: Secondary | ICD-10-CM | POA: Diagnosis not present

## 2020-05-13 DIAGNOSIS — N184 Chronic kidney disease, stage 4 (severe): Secondary | ICD-10-CM | POA: Diagnosis not present

## 2020-05-13 DIAGNOSIS — M62442 Contracture of muscle, left hand: Secondary | ICD-10-CM | POA: Diagnosis not present

## 2020-05-13 DIAGNOSIS — M2560 Stiffness of unspecified joint, not elsewhere classified: Secondary | ICD-10-CM | POA: Diagnosis not present

## 2020-05-13 DIAGNOSIS — M24562 Contracture, left knee: Secondary | ICD-10-CM | POA: Diagnosis not present

## 2020-05-13 DIAGNOSIS — Z86718 Personal history of other venous thrombosis and embolism: Secondary | ICD-10-CM | POA: Diagnosis not present

## 2020-05-13 DIAGNOSIS — R488 Other symbolic dysfunctions: Secondary | ICD-10-CM | POA: Diagnosis not present

## 2020-05-13 DIAGNOSIS — E119 Type 2 diabetes mellitus without complications: Secondary | ICD-10-CM | POA: Diagnosis not present

## 2020-05-13 DIAGNOSIS — R279 Unspecified lack of coordination: Secondary | ICD-10-CM | POA: Diagnosis not present

## 2020-05-13 DIAGNOSIS — M6281 Muscle weakness (generalized): Secondary | ICD-10-CM | POA: Diagnosis not present

## 2020-05-13 DIAGNOSIS — R2681 Unsteadiness on feet: Secondary | ICD-10-CM | POA: Diagnosis not present

## 2020-05-13 DIAGNOSIS — R532 Functional quadriplegia: Secondary | ICD-10-CM | POA: Diagnosis not present

## 2020-05-13 DIAGNOSIS — R1312 Dysphagia, oropharyngeal phase: Secondary | ICD-10-CM | POA: Diagnosis not present

## 2020-05-13 DIAGNOSIS — I110 Hypertensive heart disease with heart failure: Secondary | ICD-10-CM | POA: Diagnosis not present

## 2020-05-14 DIAGNOSIS — R2681 Unsteadiness on feet: Secondary | ICD-10-CM | POA: Diagnosis not present

## 2020-05-14 DIAGNOSIS — I69954 Hemiplegia and hemiparesis following unspecified cerebrovascular disease affecting left non-dominant side: Secondary | ICD-10-CM | POA: Diagnosis not present

## 2020-05-14 DIAGNOSIS — M6281 Muscle weakness (generalized): Secondary | ICD-10-CM | POA: Diagnosis not present

## 2020-05-14 DIAGNOSIS — R262 Difficulty in walking, not elsewhere classified: Secondary | ICD-10-CM | POA: Diagnosis not present

## 2020-05-14 DIAGNOSIS — M2560 Stiffness of unspecified joint, not elsewhere classified: Secondary | ICD-10-CM | POA: Diagnosis not present

## 2020-05-14 DIAGNOSIS — R279 Unspecified lack of coordination: Secondary | ICD-10-CM | POA: Diagnosis not present

## 2020-05-14 DIAGNOSIS — M24562 Contracture, left knee: Secondary | ICD-10-CM | POA: Diagnosis not present

## 2020-05-14 DIAGNOSIS — E119 Type 2 diabetes mellitus without complications: Secondary | ICD-10-CM | POA: Diagnosis not present

## 2020-05-14 DIAGNOSIS — R1319 Other dysphagia: Secondary | ICD-10-CM | POA: Diagnosis not present

## 2020-05-14 DIAGNOSIS — R488 Other symbolic dysfunctions: Secondary | ICD-10-CM | POA: Diagnosis not present

## 2020-05-14 DIAGNOSIS — M62442 Contracture of muscle, left hand: Secondary | ICD-10-CM | POA: Diagnosis not present

## 2020-05-14 DIAGNOSIS — R1312 Dysphagia, oropharyngeal phase: Secondary | ICD-10-CM | POA: Diagnosis not present

## 2020-05-15 ENCOUNTER — Other Ambulatory Visit: Payer: Self-pay

## 2020-05-15 ENCOUNTER — Non-Acute Institutional Stay: Payer: Medicare Other | Admitting: Adult Health Nurse Practitioner

## 2020-05-15 DIAGNOSIS — Z515 Encounter for palliative care: Secondary | ICD-10-CM

## 2020-05-15 DIAGNOSIS — M2560 Stiffness of unspecified joint, not elsewhere classified: Secondary | ICD-10-CM | POA: Diagnosis not present

## 2020-05-15 DIAGNOSIS — R1319 Other dysphagia: Secondary | ICD-10-CM | POA: Diagnosis not present

## 2020-05-15 DIAGNOSIS — R2681 Unsteadiness on feet: Secondary | ICD-10-CM | POA: Diagnosis not present

## 2020-05-15 DIAGNOSIS — R1312 Dysphagia, oropharyngeal phase: Secondary | ICD-10-CM | POA: Diagnosis not present

## 2020-05-15 DIAGNOSIS — R488 Other symbolic dysfunctions: Secondary | ICD-10-CM | POA: Diagnosis not present

## 2020-05-15 DIAGNOSIS — R262 Difficulty in walking, not elsewhere classified: Secondary | ICD-10-CM | POA: Diagnosis not present

## 2020-05-15 DIAGNOSIS — R279 Unspecified lack of coordination: Secondary | ICD-10-CM | POA: Diagnosis not present

## 2020-05-15 DIAGNOSIS — M62442 Contracture of muscle, left hand: Secondary | ICD-10-CM | POA: Diagnosis not present

## 2020-05-15 DIAGNOSIS — E119 Type 2 diabetes mellitus without complications: Secondary | ICD-10-CM | POA: Diagnosis not present

## 2020-05-15 DIAGNOSIS — F0150 Vascular dementia without behavioral disturbance: Secondary | ICD-10-CM

## 2020-05-15 DIAGNOSIS — M24562 Contracture, left knee: Secondary | ICD-10-CM | POA: Diagnosis not present

## 2020-05-15 DIAGNOSIS — I69954 Hemiplegia and hemiparesis following unspecified cerebrovascular disease affecting left non-dominant side: Secondary | ICD-10-CM | POA: Diagnosis not present

## 2020-05-15 DIAGNOSIS — M6281 Muscle weakness (generalized): Secondary | ICD-10-CM | POA: Diagnosis not present

## 2020-05-15 NOTE — Progress Notes (Signed)
[place] Consult Note Telephone: [phone]  Fax: [phone]  PATIENT NAME: Chelsea Mcdaniel DOB: 1928/03/08 MRN: [id number]  PRIMARY CARE PROVIDER:   Dr. Maryella Shivers  REFERRING PROVIDER:  Roddie Mc PA  RESPONSIBLE PARTY:   Yunuen Mordan, DIL [phone] Nguyen Butler, son  [phone]    RECOMMENDATIONS and PLAN:  1.  Advanced care planning.  Patient is DNR.  Left VM with DIL as she is listed as primary contact.  Left contact info.  2.  Functional status.  Patient is bed bound and requires total care including feeding.  Staff does state that some days she is more alert than others and will sit up in wheelchair.  PT assists with stretching and positioning of her joints to relieve contractures. Contractures of legs have improved with their care. Continue supportive care at facility  3.  Goals of care.  Spoke with daughter in law about patient's decline.  Discussed hospice services versus comfort measures at the facility.  She wants to discuss with rest of family before making a decision.  Encouraged to call with any questions.  Will follow up in one week.  I spent 30 minutes providing this consultation,  from 9:30 to 10:00 including time spent with patient/family, chart review, provider coordination, documentation. More than 50% of the time in this consultation was spent coordinating communication.   HISTORY OF PRESENT ILLNESS:  Chelsea Mcdaniel is a 86 y.o. year old female with multiple medical problems including dementia, CKD IV, DMT2, HTN, h/o CVA., has chronic DVT of right lower extremity and is on coumadin. Palliative Care was asked to help address goals of care. ROS unobtainable secondary to dementia.    CODE STATUS: DNR  PPS: 30% HOSPICE ELIGIBILITY/DIAGNOSIS: TBD  PHYSICAL EXAM:   General: NAD, frail appearing, thin Cardiovascular: regular rate and rhythm Pulmonary: clear ant fields; normal respiratory  effort Abdomen: soft, nontender, + bowel sounds GU: no suprapubic tenderness Extremities: no edema,contractures of legs and hands. Skin: pressure injury of various stages to sacrum, right gluteal fold, and bilateral heels being followed by wound nurse at facility Neurological: Weakness; patient alert but sleeps a lot; easily arousable   PAST MEDICAL HISTORY:  Past Medical History:  Diagnosis Date  . Anemia     normocytic anemia with baseline hemoglobin 10-11  . Blindness of left eye     likely related to stroke, left cataract removed from that eye with complications  . Calculus gallbladder and bile duct with cholecystitis with obstruction 01/2015  . Chronic kidney disease 2006    left renal artery stenosis with probable hemodynamic significance, kidneys are normal in morphology without focal lesions or hydronephrosis this is based but cannot A. of the abdomen with and without contrast done on the 31st 2006  . CKD (chronic kidney disease), stage III   . CVA (cerebral vascular accident) ([place]) 1990's  . CVA (cerebrovascular accident) [facility])  October 2007    CT of the head atrophy with multiple remote insults noted but no definite acute findings  . Diabetes mellitus without complication ([place])   . Glaucoma   . Gout   . Hyperlipidemia   . Hypertension   . Personal history of DVT (deep vein thrombosis) 08/11/2010   BLE  . Stroke ([place])   . Type II diabetes mellitus ([place])     SOCIAL HX:  Social History   Tobacco Use  . Smoking status: Never Smoker  . Smokeless tobacco: Never Used  Substance Use  Topics  . Alcohol use: Not Currently    ALLERGIES:  Allergies  Allergen Reactions  . Diltiazem Other (See Comments)    Symptomatic bradaycardia  . Diltiazem     unknown  . Metoprolol Other (See Comments)    Symptomatic bradycardia  . Metoprolol     unknown     PERTINENT MEDICATIONS:  Outpatient Encounter Medications as of 05/15/2020  Medication Sig  . Accu-Chek Softclix Lancets  lancets Use to check blood sugar 3 times daily. DIAG CODE E11.9  . allopurinol (ZYLOPRIM) 100 MG tablet TAKE 1/2 TABLET BY MOUTH EVERY OTHER DAY  . amLODipine (NORVASC) 10 MG tablet Take 1 tablet (10 mg total) by mouth daily.  Marland Kitchen atorvastatin (LIPITOR) 40 MG tablet Take 1 tablet (40 mg total) by mouth daily.  . Blood Glucose Monitoring Suppl (ACCU-CHEK AVIVA PLUS) w/Device KIT Check blood sugar 1 time a day  . calcitRIOL (ROCALTROL) 0.25 MCG capsule Take 0.25 mcg by mouth daily.  . cloNIDine (CATAPRES - DOSED IN MG/24 HR) 0.3 mg/24hr patch APPLY 1 PATCH TO SKIN EVERY 7 DAYS  . dorzolamide-timolol (COSOPT) 22.3-6.8 MG/ML ophthalmic solution Place 1 drop into both eyes 2 (two) times daily.  . furosemide (LASIX) 10 MG/ML solution Take 8 mLs by mouth in the morning and at bedtime.  Marland Kitchen GAVILAX 17 GM/SCOOP powder Take 17 grams by mouth 2 times daily  . glucose blood (ACCU-CHEK AVIVA PLUS) test strip USE 1 TIME DAILY TO CHECK BLOOD SUGAR  . glucose blood (ACCU-CHEK AVIVA) test strip Use as instructed  . hydrALAZINE (APRESOLINE) 25 MG tablet Take 3 tablets (75 mg total) by mouth 3 (three) times daily.  Marland Kitchen JANUVIA 25 MG tablet TAKE 1 TABLET BY MOUTH EVERY DAY  . Lancets Misc. (ACCU-CHEK SOFTCLIX LANCET DEV) KIT Use to check blood sugar one time a day  . latanoprost (XALATAN) 0.005 % ophthalmic solution Place 1 drop into both eyes at bedtime.  Marland Kitchen LUMIGAN 0.01 % SOLN Place 1 drop into both eyes at bedtime.   . Melatonin 1 MG/ML LIQD TAKE 3.5 mls BY MOUTH AT BEDTIME AS NEEDED  . warfarin (COUMADIN) 5 MG tablet Take 5 mg by mouth daily.   No facility-administered encounter medications on file as of 05/15/2020.     Chasten Blaze Jenetta Downer, NP

## 2020-05-16 DIAGNOSIS — E119 Type 2 diabetes mellitus without complications: Secondary | ICD-10-CM | POA: Diagnosis not present

## 2020-05-16 DIAGNOSIS — R262 Difficulty in walking, not elsewhere classified: Secondary | ICD-10-CM | POA: Diagnosis not present

## 2020-05-16 DIAGNOSIS — R2681 Unsteadiness on feet: Secondary | ICD-10-CM | POA: Diagnosis not present

## 2020-05-16 DIAGNOSIS — R1312 Dysphagia, oropharyngeal phase: Secondary | ICD-10-CM | POA: Diagnosis not present

## 2020-05-16 DIAGNOSIS — M2560 Stiffness of unspecified joint, not elsewhere classified: Secondary | ICD-10-CM | POA: Diagnosis not present

## 2020-05-16 DIAGNOSIS — M6281 Muscle weakness (generalized): Secondary | ICD-10-CM | POA: Diagnosis not present

## 2020-05-16 DIAGNOSIS — I69815 Cognitive social or emotional deficit following other cerebrovascular disease: Secondary | ICD-10-CM | POA: Diagnosis not present

## 2020-05-16 DIAGNOSIS — R488 Other symbolic dysfunctions: Secondary | ICD-10-CM | POA: Diagnosis not present

## 2020-05-16 DIAGNOSIS — R1319 Other dysphagia: Secondary | ICD-10-CM | POA: Diagnosis not present

## 2020-05-16 DIAGNOSIS — M62442 Contracture of muscle, left hand: Secondary | ICD-10-CM | POA: Diagnosis not present

## 2020-05-16 DIAGNOSIS — M24562 Contracture, left knee: Secondary | ICD-10-CM | POA: Diagnosis not present

## 2020-05-16 DIAGNOSIS — R279 Unspecified lack of coordination: Secondary | ICD-10-CM | POA: Diagnosis not present

## 2020-05-16 DIAGNOSIS — I110 Hypertensive heart disease with heart failure: Secondary | ICD-10-CM | POA: Diagnosis not present

## 2020-05-16 DIAGNOSIS — I69954 Hemiplegia and hemiparesis following unspecified cerebrovascular disease affecting left non-dominant side: Secondary | ICD-10-CM | POA: Diagnosis not present

## 2020-05-16 DIAGNOSIS — E43 Unspecified severe protein-calorie malnutrition: Secondary | ICD-10-CM | POA: Diagnosis not present

## 2020-05-18 DIAGNOSIS — M6281 Muscle weakness (generalized): Secondary | ICD-10-CM | POA: Diagnosis not present

## 2020-05-18 DIAGNOSIS — R279 Unspecified lack of coordination: Secondary | ICD-10-CM | POA: Diagnosis not present

## 2020-05-18 DIAGNOSIS — M62442 Contracture of muscle, left hand: Secondary | ICD-10-CM | POA: Diagnosis not present

## 2020-05-18 DIAGNOSIS — I69954 Hemiplegia and hemiparesis following unspecified cerebrovascular disease affecting left non-dominant side: Secondary | ICD-10-CM | POA: Diagnosis not present

## 2020-05-18 DIAGNOSIS — R1312 Dysphagia, oropharyngeal phase: Secondary | ICD-10-CM | POA: Diagnosis not present

## 2020-05-18 DIAGNOSIS — R262 Difficulty in walking, not elsewhere classified: Secondary | ICD-10-CM | POA: Diagnosis not present

## 2020-05-18 DIAGNOSIS — M24562 Contracture, left knee: Secondary | ICD-10-CM | POA: Diagnosis not present

## 2020-05-18 DIAGNOSIS — R1319 Other dysphagia: Secondary | ICD-10-CM | POA: Diagnosis not present

## 2020-05-18 DIAGNOSIS — R488 Other symbolic dysfunctions: Secondary | ICD-10-CM | POA: Diagnosis not present

## 2020-05-18 DIAGNOSIS — R2681 Unsteadiness on feet: Secondary | ICD-10-CM | POA: Diagnosis not present

## 2020-05-18 DIAGNOSIS — E119 Type 2 diabetes mellitus without complications: Secondary | ICD-10-CM | POA: Diagnosis not present

## 2020-05-18 DIAGNOSIS — M2560 Stiffness of unspecified joint, not elsewhere classified: Secondary | ICD-10-CM | POA: Diagnosis not present

## 2020-05-19 DIAGNOSIS — M62442 Contracture of muscle, left hand: Secondary | ICD-10-CM | POA: Diagnosis not present

## 2020-05-19 DIAGNOSIS — M6281 Muscle weakness (generalized): Secondary | ICD-10-CM | POA: Diagnosis not present

## 2020-05-19 DIAGNOSIS — R488 Other symbolic dysfunctions: Secondary | ICD-10-CM | POA: Diagnosis not present

## 2020-05-19 DIAGNOSIS — E119 Type 2 diabetes mellitus without complications: Secondary | ICD-10-CM | POA: Diagnosis not present

## 2020-05-19 DIAGNOSIS — R2681 Unsteadiness on feet: Secondary | ICD-10-CM | POA: Diagnosis not present

## 2020-05-19 DIAGNOSIS — R1312 Dysphagia, oropharyngeal phase: Secondary | ICD-10-CM | POA: Diagnosis not present

## 2020-05-19 DIAGNOSIS — R262 Difficulty in walking, not elsewhere classified: Secondary | ICD-10-CM | POA: Diagnosis not present

## 2020-05-19 DIAGNOSIS — R1319 Other dysphagia: Secondary | ICD-10-CM | POA: Diagnosis not present

## 2020-05-19 DIAGNOSIS — R279 Unspecified lack of coordination: Secondary | ICD-10-CM | POA: Diagnosis not present

## 2020-05-19 DIAGNOSIS — I69954 Hemiplegia and hemiparesis following unspecified cerebrovascular disease affecting left non-dominant side: Secondary | ICD-10-CM | POA: Diagnosis not present

## 2020-05-19 DIAGNOSIS — M2560 Stiffness of unspecified joint, not elsewhere classified: Secondary | ICD-10-CM | POA: Diagnosis not present

## 2020-05-19 DIAGNOSIS — M24562 Contracture, left knee: Secondary | ICD-10-CM | POA: Diagnosis not present

## 2020-05-20 DIAGNOSIS — R279 Unspecified lack of coordination: Secondary | ICD-10-CM | POA: Diagnosis not present

## 2020-05-20 DIAGNOSIS — L89626 Pressure-induced deep tissue damage of left heel: Secondary | ICD-10-CM | POA: Diagnosis not present

## 2020-05-20 DIAGNOSIS — M24562 Contracture, left knee: Secondary | ICD-10-CM | POA: Diagnosis not present

## 2020-05-20 DIAGNOSIS — M2560 Stiffness of unspecified joint, not elsewhere classified: Secondary | ICD-10-CM | POA: Diagnosis not present

## 2020-05-20 DIAGNOSIS — R488 Other symbolic dysfunctions: Secondary | ICD-10-CM | POA: Diagnosis not present

## 2020-05-20 DIAGNOSIS — M62442 Contracture of muscle, left hand: Secondary | ICD-10-CM | POA: Diagnosis not present

## 2020-05-20 DIAGNOSIS — R262 Difficulty in walking, not elsewhere classified: Secondary | ICD-10-CM | POA: Diagnosis not present

## 2020-05-20 DIAGNOSIS — M6281 Muscle weakness (generalized): Secondary | ICD-10-CM | POA: Diagnosis not present

## 2020-05-20 DIAGNOSIS — R1319 Other dysphagia: Secondary | ICD-10-CM | POA: Diagnosis not present

## 2020-05-20 DIAGNOSIS — E119 Type 2 diabetes mellitus without complications: Secondary | ICD-10-CM | POA: Diagnosis not present

## 2020-05-20 DIAGNOSIS — R2681 Unsteadiness on feet: Secondary | ICD-10-CM | POA: Diagnosis not present

## 2020-05-20 DIAGNOSIS — R1312 Dysphagia, oropharyngeal phase: Secondary | ICD-10-CM | POA: Diagnosis not present

## 2020-05-20 DIAGNOSIS — I69954 Hemiplegia and hemiparesis following unspecified cerebrovascular disease affecting left non-dominant side: Secondary | ICD-10-CM | POA: Diagnosis not present

## 2020-05-21 ENCOUNTER — Other Ambulatory Visit: Payer: Self-pay

## 2020-05-21 ENCOUNTER — Non-Acute Institutional Stay: Payer: Medicare Other | Admitting: Adult Health Nurse Practitioner

## 2020-05-21 DIAGNOSIS — M24562 Contracture, left knee: Secondary | ICD-10-CM | POA: Diagnosis not present

## 2020-05-21 DIAGNOSIS — R1319 Other dysphagia: Secondary | ICD-10-CM | POA: Diagnosis not present

## 2020-05-21 DIAGNOSIS — M6281 Muscle weakness (generalized): Secondary | ICD-10-CM | POA: Diagnosis not present

## 2020-05-21 DIAGNOSIS — R488 Other symbolic dysfunctions: Secondary | ICD-10-CM | POA: Diagnosis not present

## 2020-05-21 DIAGNOSIS — F0150 Vascular dementia without behavioral disturbance: Secondary | ICD-10-CM

## 2020-05-21 DIAGNOSIS — E119 Type 2 diabetes mellitus without complications: Secondary | ICD-10-CM | POA: Diagnosis not present

## 2020-05-21 DIAGNOSIS — M62442 Contracture of muscle, left hand: Secondary | ICD-10-CM | POA: Diagnosis not present

## 2020-05-21 DIAGNOSIS — R262 Difficulty in walking, not elsewhere classified: Secondary | ICD-10-CM | POA: Diagnosis not present

## 2020-05-21 DIAGNOSIS — Z515 Encounter for palliative care: Secondary | ICD-10-CM | POA: Diagnosis not present

## 2020-05-21 DIAGNOSIS — R1312 Dysphagia, oropharyngeal phase: Secondary | ICD-10-CM | POA: Diagnosis not present

## 2020-05-21 DIAGNOSIS — R279 Unspecified lack of coordination: Secondary | ICD-10-CM | POA: Diagnosis not present

## 2020-05-21 DIAGNOSIS — R2681 Unsteadiness on feet: Secondary | ICD-10-CM | POA: Diagnosis not present

## 2020-05-21 DIAGNOSIS — I69954 Hemiplegia and hemiparesis following unspecified cerebrovascular disease affecting left non-dominant side: Secondary | ICD-10-CM | POA: Diagnosis not present

## 2020-05-21 DIAGNOSIS — M2560 Stiffness of unspecified joint, not elsewhere classified: Secondary | ICD-10-CM | POA: Diagnosis not present

## 2020-05-21 NOTE — Progress Notes (Signed)
[place] Consult Note Telephone: [phone]  Fax: [phone]  PATIENT NAME: Chelsea Mcdaniel DOB: [birth year] MRN: [id number]  PRIMARY CARE PROVIDER:  Dr. Maryella Shivers  REFERRING PROVIDER:David Herring PA  RESPONSIBLE PARTY:Georgeanna [place], DIL [phone] Georganne Siple, son [phone]   RECOMMENDATIONS and PLAN: 1.Advanced care planning. Patient is DNR. Left VM with DIL as she is listed as primary contact. Left contact info.  2.  Functional status.  Patient is bed bound and requires total care including feeding.  Staff does state that some days she is more alert than others and will sit up in wheelchair. She eats well when fed. No falls.  Continue supportive care at the facility  Patient is stable at this time and appears comfortable.  Will continue conversation with family about hospice versus comfort measures.  Palliative will continue to monitor for symptom management/decline and make recommendations as needed.  Follow up in 4-6 weeks.  I spent 30 minutes providing this consultation,  from 10:30 to 11:00 including time spent with patient, chart review, provider coordination, documentation. More than 50% of the time in this consultation was spent coordinating communication.   HISTORY OF PRESENT ILLNESS:  Chelsea GLASPY is a 86 y.o. year old female with multiple medical problems including dementia, CKD IV, DMT2, HTN, h/o CVA., has chronic DVT of right lower extremity and is on coumadin. Palliative Care was asked to help address goals of care. ROS unobtainable secondary to dementia.    CODE STATUS: DNR  PPS: 30% HOSPICE ELIGIBILITY/DIAGNOSIS: TBD  PHYSICAL EXAM:   General: NAD, frail appearing, thin Cardiovascular: regular rate and rhythm Pulmonary: clear ant fields; normal respiratory effort Abdomen: soft, nontender, + bowel sounds GU: no suprapubic tenderness Extremities: no edema,contractures of  legs and hands. Skin: pressure injury of various stages to sacrum, right gluteal fold, and bilateral heels being followed by wound nurse at facility Neurological: Weakness; patient alert but sleeps a lot; easily arousable  PAST MEDICAL HISTORY:  Past Medical History:  Diagnosis Date   Anemia     normocytic anemia with baseline hemoglobin 10-11   Blindness of left eye     likely related to stroke, left cataract removed from that eye with complications   Calculus gallbladder and bile duct with cholecystitis with obstruction 01/2015   Chronic kidney disease 2006    left renal artery stenosis with probable hemodynamic significance, kidneys are normal in morphology without focal lesions or hydronephrosis this is based but cannot A. of the abdomen with and without contrast done on the 31st 2006   CKD (chronic kidney disease), stage III    CVA (cere[facility] vascular accident) [facility]) 1990's   CVA (cerebrovascular accident) [facility])  October 2007    CT of the head atrophy with multiple remote insults noted but no definite acute findings   Diabetes mellitus without complication (HCC)    Glaucoma    Gout    Hyperlipidemia    Hypertension    Personal history of DVT (deep vein thrombosis) 08/11/2010   BLE   Stroke ([place])    Type II diabetes mellitus ([place])     SOCIAL HX:  Social History   Tobacco Use   Smoking status: Never Smoker   Smokeless tobacco: Never Used  Substance Use Topics   Alcohol use: Not Currently    ALLERGIES:  Allergies  Allergen Reactions   Diltiazem Other (See Comments)    Symptomatic bradaycardia   Diltiazem     unknown   Metoprolol  Other (See Comments)    Symptomatic bradycardia   Metoprolol     unknown     PERTINENT MEDICATIONS:  Outpatient Encounter Medications as of 05/21/2020  Medication Sig   Accu-Chek Softclix Lancets lancets Use to check blood sugar 3 times daily. DIAG CODE E11.9   allopurinol (ZYLOPRIM) 100 MG tablet TAKE 1/2 TABLET BY  MOUTH EVERY OTHER DAY   amLODipine (NORVASC) 10 MG tablet Take 1 tablet (10 mg total) by mouth daily.   atorvastatin (LIPITOR) 40 MG tablet Take 1 tablet (40 mg total) by mouth daily.   Blood Glucose Monitoring Suppl (ACCU-CHEK AVIVA PLUS) w/Device KIT Check blood sugar 1 time a day   calcitRIOL (ROCALTROL) 0.25 MCG capsule Take 0.25 mcg by mouth daily.   cloNIDine (CATAPRES - DOSED IN MG/24 HR) 0.3 mg/24hr patch APPLY 1 PATCH TO SKIN EVERY 7 DAYS   dorzolamide-timolol (COSOPT) 22.3-6.8 MG/ML ophthalmic solution Place 1 drop into both eyes 2 (two) times daily.   furosemide (LASIX) 10 MG/ML solution Take 8 mLs by mouth in the morning and at bedtime.   GAVILAX 17 GM/SCOOP powder Take 17 grams by mouth 2 times daily   glucose blood (ACCU-CHEK AVIVA PLUS) test strip USE 1 TIME DAILY TO CHECK BLOOD SUGAR   glucose blood (ACCU-CHEK AVIVA) test strip Use as instructed   hydrALAZINE (APRESOLINE) 25 MG tablet Take 3 tablets (75 mg total) by mouth 3 (three) times daily.   JANUVIA 25 MG tablet TAKE 1 TABLET BY MOUTH EVERY DAY   Lancets Misc. (ACCU-CHEK SOFTCLIX LANCET DEV) KIT Use to check blood sugar one time a day   latanoprost (XALATAN) 0.005 % ophthalmic solution Place 1 drop into both eyes at bedtime.   LUMIGAN 0.01 % SOLN Place 1 drop into both eyes at bedtime.    Melatonin 1 MG/ML LIQD TAKE 3.5 mls BY MOUTH AT BEDTIME AS NEEDED   warfarin (COUMADIN) 5 MG tablet Take 5 mg by mouth daily.   No facility-administered encounter medications on file as of 05/21/2020.     Julia Alkhatib Jenetta Downer, NP

## 2020-05-22 DIAGNOSIS — S31819D Unspecified open wound of right buttock, subsequent encounter: Secondary | ICD-10-CM | POA: Diagnosis not present

## 2020-05-22 DIAGNOSIS — R262 Difficulty in walking, not elsewhere classified: Secondary | ICD-10-CM | POA: Diagnosis not present

## 2020-05-22 DIAGNOSIS — M6281 Muscle weakness (generalized): Secondary | ICD-10-CM | POA: Diagnosis not present

## 2020-05-22 DIAGNOSIS — I69954 Hemiplegia and hemiparesis following unspecified cerebrovascular disease affecting left non-dominant side: Secondary | ICD-10-CM | POA: Diagnosis not present

## 2020-05-22 DIAGNOSIS — M2560 Stiffness of unspecified joint, not elsewhere classified: Secondary | ICD-10-CM | POA: Diagnosis not present

## 2020-05-22 DIAGNOSIS — M24562 Contracture, left knee: Secondary | ICD-10-CM | POA: Diagnosis not present

## 2020-05-22 DIAGNOSIS — R488 Other symbolic dysfunctions: Secondary | ICD-10-CM | POA: Diagnosis not present

## 2020-05-22 DIAGNOSIS — R279 Unspecified lack of coordination: Secondary | ICD-10-CM | POA: Diagnosis not present

## 2020-05-22 DIAGNOSIS — M62442 Contracture of muscle, left hand: Secondary | ICD-10-CM | POA: Diagnosis not present

## 2020-05-22 DIAGNOSIS — R1319 Other dysphagia: Secondary | ICD-10-CM | POA: Diagnosis not present

## 2020-05-22 DIAGNOSIS — L89610 Pressure ulcer of right heel, unstageable: Secondary | ICD-10-CM | POA: Diagnosis not present

## 2020-05-22 DIAGNOSIS — E119 Type 2 diabetes mellitus without complications: Secondary | ICD-10-CM | POA: Diagnosis not present

## 2020-05-22 DIAGNOSIS — R2681 Unsteadiness on feet: Secondary | ICD-10-CM | POA: Diagnosis not present

## 2020-05-22 DIAGNOSIS — L89619 Pressure ulcer of right heel, unspecified stage: Secondary | ICD-10-CM | POA: Diagnosis not present

## 2020-05-22 DIAGNOSIS — R1312 Dysphagia, oropharyngeal phase: Secondary | ICD-10-CM | POA: Diagnosis not present

## 2020-05-23 DIAGNOSIS — R2681 Unsteadiness on feet: Secondary | ICD-10-CM | POA: Diagnosis not present

## 2020-05-23 DIAGNOSIS — R262 Difficulty in walking, not elsewhere classified: Secondary | ICD-10-CM | POA: Diagnosis not present

## 2020-05-23 DIAGNOSIS — E119 Type 2 diabetes mellitus without complications: Secondary | ICD-10-CM | POA: Diagnosis not present

## 2020-05-23 DIAGNOSIS — R1312 Dysphagia, oropharyngeal phase: Secondary | ICD-10-CM | POA: Diagnosis not present

## 2020-05-23 DIAGNOSIS — M62442 Contracture of muscle, left hand: Secondary | ICD-10-CM | POA: Diagnosis not present

## 2020-05-23 DIAGNOSIS — R279 Unspecified lack of coordination: Secondary | ICD-10-CM | POA: Diagnosis not present

## 2020-05-23 DIAGNOSIS — I69954 Hemiplegia and hemiparesis following unspecified cerebrovascular disease affecting left non-dominant side: Secondary | ICD-10-CM | POA: Diagnosis not present

## 2020-05-23 DIAGNOSIS — M24562 Contracture, left knee: Secondary | ICD-10-CM | POA: Diagnosis not present

## 2020-05-23 DIAGNOSIS — R1319 Other dysphagia: Secondary | ICD-10-CM | POA: Diagnosis not present

## 2020-05-23 DIAGNOSIS — R488 Other symbolic dysfunctions: Secondary | ICD-10-CM | POA: Diagnosis not present

## 2020-05-23 DIAGNOSIS — M2560 Stiffness of unspecified joint, not elsewhere classified: Secondary | ICD-10-CM | POA: Diagnosis not present

## 2020-05-23 DIAGNOSIS — M6281 Muscle weakness (generalized): Secondary | ICD-10-CM | POA: Diagnosis not present

## 2020-05-24 DIAGNOSIS — E119 Type 2 diabetes mellitus without complications: Secondary | ICD-10-CM | POA: Diagnosis not present

## 2020-05-24 DIAGNOSIS — R2681 Unsteadiness on feet: Secondary | ICD-10-CM | POA: Diagnosis not present

## 2020-05-24 DIAGNOSIS — R262 Difficulty in walking, not elsewhere classified: Secondary | ICD-10-CM | POA: Diagnosis not present

## 2020-05-24 DIAGNOSIS — R1319 Other dysphagia: Secondary | ICD-10-CM | POA: Diagnosis not present

## 2020-05-24 DIAGNOSIS — M62442 Contracture of muscle, left hand: Secondary | ICD-10-CM | POA: Diagnosis not present

## 2020-05-24 DIAGNOSIS — R1312 Dysphagia, oropharyngeal phase: Secondary | ICD-10-CM | POA: Diagnosis not present

## 2020-05-24 DIAGNOSIS — R279 Unspecified lack of coordination: Secondary | ICD-10-CM | POA: Diagnosis not present

## 2020-05-24 DIAGNOSIS — M24562 Contracture, left knee: Secondary | ICD-10-CM | POA: Diagnosis not present

## 2020-05-24 DIAGNOSIS — I69954 Hemiplegia and hemiparesis following unspecified cerebrovascular disease affecting left non-dominant side: Secondary | ICD-10-CM | POA: Diagnosis not present

## 2020-05-24 DIAGNOSIS — M6281 Muscle weakness (generalized): Secondary | ICD-10-CM | POA: Diagnosis not present

## 2020-05-24 DIAGNOSIS — R488 Other symbolic dysfunctions: Secondary | ICD-10-CM | POA: Diagnosis not present

## 2020-05-24 DIAGNOSIS — M2560 Stiffness of unspecified joint, not elsewhere classified: Secondary | ICD-10-CM | POA: Diagnosis not present

## 2020-05-25 DIAGNOSIS — R1319 Other dysphagia: Secondary | ICD-10-CM | POA: Diagnosis not present

## 2020-05-25 DIAGNOSIS — R262 Difficulty in walking, not elsewhere classified: Secondary | ICD-10-CM | POA: Diagnosis not present

## 2020-05-25 DIAGNOSIS — R488 Other symbolic dysfunctions: Secondary | ICD-10-CM | POA: Diagnosis not present

## 2020-05-25 DIAGNOSIS — M24562 Contracture, left knee: Secondary | ICD-10-CM | POA: Diagnosis not present

## 2020-05-25 DIAGNOSIS — M62442 Contracture of muscle, left hand: Secondary | ICD-10-CM | POA: Diagnosis not present

## 2020-05-25 DIAGNOSIS — I69954 Hemiplegia and hemiparesis following unspecified cerebrovascular disease affecting left non-dominant side: Secondary | ICD-10-CM | POA: Diagnosis not present

## 2020-05-25 DIAGNOSIS — M6281 Muscle weakness (generalized): Secondary | ICD-10-CM | POA: Diagnosis not present

## 2020-05-25 DIAGNOSIS — R279 Unspecified lack of coordination: Secondary | ICD-10-CM | POA: Diagnosis not present

## 2020-05-25 DIAGNOSIS — E119 Type 2 diabetes mellitus without complications: Secondary | ICD-10-CM | POA: Diagnosis not present

## 2020-05-25 DIAGNOSIS — R1312 Dysphagia, oropharyngeal phase: Secondary | ICD-10-CM | POA: Diagnosis not present

## 2020-05-25 DIAGNOSIS — R2681 Unsteadiness on feet: Secondary | ICD-10-CM | POA: Diagnosis not present

## 2020-05-25 DIAGNOSIS — M2560 Stiffness of unspecified joint, not elsewhere classified: Secondary | ICD-10-CM | POA: Diagnosis not present

## 2020-05-27 DIAGNOSIS — M2560 Stiffness of unspecified joint, not elsewhere classified: Secondary | ICD-10-CM | POA: Diagnosis not present

## 2020-05-27 DIAGNOSIS — R1312 Dysphagia, oropharyngeal phase: Secondary | ICD-10-CM | POA: Diagnosis not present

## 2020-05-27 DIAGNOSIS — R2681 Unsteadiness on feet: Secondary | ICD-10-CM | POA: Diagnosis not present

## 2020-05-27 DIAGNOSIS — T148XXA Other injury of unspecified body region, initial encounter: Secondary | ICD-10-CM | POA: Diagnosis not present

## 2020-05-27 DIAGNOSIS — L89626 Pressure-induced deep tissue damage of left heel: Secondary | ICD-10-CM | POA: Diagnosis not present

## 2020-05-27 DIAGNOSIS — L89616 Pressure-induced deep tissue damage of right heel: Secondary | ICD-10-CM | POA: Diagnosis not present

## 2020-05-27 DIAGNOSIS — M62442 Contracture of muscle, left hand: Secondary | ICD-10-CM | POA: Diagnosis not present

## 2020-05-27 DIAGNOSIS — I69954 Hemiplegia and hemiparesis following unspecified cerebrovascular disease affecting left non-dominant side: Secondary | ICD-10-CM | POA: Diagnosis not present

## 2020-05-27 DIAGNOSIS — R488 Other symbolic dysfunctions: Secondary | ICD-10-CM | POA: Diagnosis not present

## 2020-05-27 DIAGNOSIS — R1319 Other dysphagia: Secondary | ICD-10-CM | POA: Diagnosis not present

## 2020-05-27 DIAGNOSIS — M6281 Muscle weakness (generalized): Secondary | ICD-10-CM | POA: Diagnosis not present

## 2020-05-27 DIAGNOSIS — M24562 Contracture, left knee: Secondary | ICD-10-CM | POA: Diagnosis not present

## 2020-05-27 DIAGNOSIS — R279 Unspecified lack of coordination: Secondary | ICD-10-CM | POA: Diagnosis not present

## 2020-05-27 DIAGNOSIS — R262 Difficulty in walking, not elsewhere classified: Secondary | ICD-10-CM | POA: Diagnosis not present

## 2020-05-27 DIAGNOSIS — E119 Type 2 diabetes mellitus without complications: Secondary | ICD-10-CM | POA: Diagnosis not present

## 2020-05-28 DIAGNOSIS — M62442 Contracture of muscle, left hand: Secondary | ICD-10-CM | POA: Diagnosis not present

## 2020-05-28 DIAGNOSIS — R2681 Unsteadiness on feet: Secondary | ICD-10-CM | POA: Diagnosis not present

## 2020-05-28 DIAGNOSIS — E119 Type 2 diabetes mellitus without complications: Secondary | ICD-10-CM | POA: Diagnosis not present

## 2020-05-28 DIAGNOSIS — R488 Other symbolic dysfunctions: Secondary | ICD-10-CM | POA: Diagnosis not present

## 2020-05-28 DIAGNOSIS — M2560 Stiffness of unspecified joint, not elsewhere classified: Secondary | ICD-10-CM | POA: Diagnosis not present

## 2020-05-28 DIAGNOSIS — R1312 Dysphagia, oropharyngeal phase: Secondary | ICD-10-CM | POA: Diagnosis not present

## 2020-05-28 DIAGNOSIS — R1319 Other dysphagia: Secondary | ICD-10-CM | POA: Diagnosis not present

## 2020-05-28 DIAGNOSIS — M24562 Contracture, left knee: Secondary | ICD-10-CM | POA: Diagnosis not present

## 2020-05-28 DIAGNOSIS — R262 Difficulty in walking, not elsewhere classified: Secondary | ICD-10-CM | POA: Diagnosis not present

## 2020-05-28 DIAGNOSIS — R279 Unspecified lack of coordination: Secondary | ICD-10-CM | POA: Diagnosis not present

## 2020-05-28 DIAGNOSIS — M6281 Muscle weakness (generalized): Secondary | ICD-10-CM | POA: Diagnosis not present

## 2020-05-28 DIAGNOSIS — I69954 Hemiplegia and hemiparesis following unspecified cerebrovascular disease affecting left non-dominant side: Secondary | ICD-10-CM | POA: Diagnosis not present

## 2020-05-29 DIAGNOSIS — R1312 Dysphagia, oropharyngeal phase: Secondary | ICD-10-CM | POA: Diagnosis not present

## 2020-05-29 DIAGNOSIS — R262 Difficulty in walking, not elsewhere classified: Secondary | ICD-10-CM | POA: Diagnosis not present

## 2020-05-29 DIAGNOSIS — E119 Type 2 diabetes mellitus without complications: Secondary | ICD-10-CM | POA: Diagnosis not present

## 2020-05-29 DIAGNOSIS — R488 Other symbolic dysfunctions: Secondary | ICD-10-CM | POA: Diagnosis not present

## 2020-05-29 DIAGNOSIS — M2560 Stiffness of unspecified joint, not elsewhere classified: Secondary | ICD-10-CM | POA: Diagnosis not present

## 2020-05-29 DIAGNOSIS — I69954 Hemiplegia and hemiparesis following unspecified cerebrovascular disease affecting left non-dominant side: Secondary | ICD-10-CM | POA: Diagnosis not present

## 2020-05-29 DIAGNOSIS — R2681 Unsteadiness on feet: Secondary | ICD-10-CM | POA: Diagnosis not present

## 2020-05-29 DIAGNOSIS — M62442 Contracture of muscle, left hand: Secondary | ICD-10-CM | POA: Diagnosis not present

## 2020-05-29 DIAGNOSIS — R279 Unspecified lack of coordination: Secondary | ICD-10-CM | POA: Diagnosis not present

## 2020-05-29 DIAGNOSIS — M24562 Contracture, left knee: Secondary | ICD-10-CM | POA: Diagnosis not present

## 2020-05-29 DIAGNOSIS — M6281 Muscle weakness (generalized): Secondary | ICD-10-CM | POA: Diagnosis not present

## 2020-05-29 DIAGNOSIS — R1319 Other dysphagia: Secondary | ICD-10-CM | POA: Diagnosis not present

## 2020-05-30 DIAGNOSIS — M2560 Stiffness of unspecified joint, not elsewhere classified: Secondary | ICD-10-CM | POA: Diagnosis not present

## 2020-05-30 DIAGNOSIS — I69954 Hemiplegia and hemiparesis following unspecified cerebrovascular disease affecting left non-dominant side: Secondary | ICD-10-CM | POA: Diagnosis not present

## 2020-05-30 DIAGNOSIS — R2681 Unsteadiness on feet: Secondary | ICD-10-CM | POA: Diagnosis not present

## 2020-05-30 DIAGNOSIS — R1312 Dysphagia, oropharyngeal phase: Secondary | ICD-10-CM | POA: Diagnosis not present

## 2020-05-30 DIAGNOSIS — M24562 Contracture, left knee: Secondary | ICD-10-CM | POA: Diagnosis not present

## 2020-05-30 DIAGNOSIS — R1319 Other dysphagia: Secondary | ICD-10-CM | POA: Diagnosis not present

## 2020-05-30 DIAGNOSIS — R488 Other symbolic dysfunctions: Secondary | ICD-10-CM | POA: Diagnosis not present

## 2020-05-30 DIAGNOSIS — M6281 Muscle weakness (generalized): Secondary | ICD-10-CM | POA: Diagnosis not present

## 2020-05-30 DIAGNOSIS — R279 Unspecified lack of coordination: Secondary | ICD-10-CM | POA: Diagnosis not present

## 2020-05-30 DIAGNOSIS — E119 Type 2 diabetes mellitus without complications: Secondary | ICD-10-CM | POA: Diagnosis not present

## 2020-05-30 DIAGNOSIS — M62442 Contracture of muscle, left hand: Secondary | ICD-10-CM | POA: Diagnosis not present

## 2020-05-30 DIAGNOSIS — R262 Difficulty in walking, not elsewhere classified: Secondary | ICD-10-CM | POA: Diagnosis not present

## 2020-06-01 DIAGNOSIS — R1319 Other dysphagia: Secondary | ICD-10-CM | POA: Diagnosis not present

## 2020-06-01 DIAGNOSIS — M6281 Muscle weakness (generalized): Secondary | ICD-10-CM | POA: Diagnosis not present

## 2020-06-01 DIAGNOSIS — R262 Difficulty in walking, not elsewhere classified: Secondary | ICD-10-CM | POA: Diagnosis not present

## 2020-06-01 DIAGNOSIS — I69954 Hemiplegia and hemiparesis following unspecified cerebrovascular disease affecting left non-dominant side: Secondary | ICD-10-CM | POA: Diagnosis not present

## 2020-06-01 DIAGNOSIS — R1312 Dysphagia, oropharyngeal phase: Secondary | ICD-10-CM | POA: Diagnosis not present

## 2020-06-01 DIAGNOSIS — M2560 Stiffness of unspecified joint, not elsewhere classified: Secondary | ICD-10-CM | POA: Diagnosis not present

## 2020-06-01 DIAGNOSIS — E119 Type 2 diabetes mellitus without complications: Secondary | ICD-10-CM | POA: Diagnosis not present

## 2020-06-01 DIAGNOSIS — R279 Unspecified lack of coordination: Secondary | ICD-10-CM | POA: Diagnosis not present

## 2020-06-01 DIAGNOSIS — R488 Other symbolic dysfunctions: Secondary | ICD-10-CM | POA: Diagnosis not present

## 2020-06-01 DIAGNOSIS — M62442 Contracture of muscle, left hand: Secondary | ICD-10-CM | POA: Diagnosis not present

## 2020-06-01 DIAGNOSIS — M24562 Contracture, left knee: Secondary | ICD-10-CM | POA: Diagnosis not present

## 2020-06-01 DIAGNOSIS — R2681 Unsteadiness on feet: Secondary | ICD-10-CM | POA: Diagnosis not present

## 2020-06-02 DIAGNOSIS — M62442 Contracture of muscle, left hand: Secondary | ICD-10-CM | POA: Diagnosis not present

## 2020-06-02 DIAGNOSIS — M6281 Muscle weakness (generalized): Secondary | ICD-10-CM | POA: Diagnosis not present

## 2020-06-02 DIAGNOSIS — M2560 Stiffness of unspecified joint, not elsewhere classified: Secondary | ICD-10-CM | POA: Diagnosis not present

## 2020-06-02 DIAGNOSIS — R1319 Other dysphagia: Secondary | ICD-10-CM | POA: Diagnosis not present

## 2020-06-02 DIAGNOSIS — E119 Type 2 diabetes mellitus without complications: Secondary | ICD-10-CM | POA: Diagnosis not present

## 2020-06-02 DIAGNOSIS — E46 Unspecified protein-calorie malnutrition: Secondary | ICD-10-CM | POA: Diagnosis not present

## 2020-06-02 DIAGNOSIS — R2681 Unsteadiness on feet: Secondary | ICD-10-CM | POA: Diagnosis not present

## 2020-06-02 DIAGNOSIS — R262 Difficulty in walking, not elsewhere classified: Secondary | ICD-10-CM | POA: Diagnosis not present

## 2020-06-02 DIAGNOSIS — R1312 Dysphagia, oropharyngeal phase: Secondary | ICD-10-CM | POA: Diagnosis not present

## 2020-06-02 DIAGNOSIS — I69954 Hemiplegia and hemiparesis following unspecified cerebrovascular disease affecting left non-dominant side: Secondary | ICD-10-CM | POA: Diagnosis not present

## 2020-06-02 DIAGNOSIS — R488 Other symbolic dysfunctions: Secondary | ICD-10-CM | POA: Diagnosis not present

## 2020-06-02 DIAGNOSIS — M24562 Contracture, left knee: Secondary | ICD-10-CM | POA: Diagnosis not present

## 2020-06-02 DIAGNOSIS — G4700 Insomnia, unspecified: Secondary | ICD-10-CM | POA: Diagnosis not present

## 2020-06-02 DIAGNOSIS — R279 Unspecified lack of coordination: Secondary | ICD-10-CM | POA: Diagnosis not present

## 2020-06-02 DIAGNOSIS — Z515 Encounter for palliative care: Secondary | ICD-10-CM | POA: Diagnosis not present

## 2020-06-03 DIAGNOSIS — L89626 Pressure-induced deep tissue damage of left heel: Secondary | ICD-10-CM | POA: Diagnosis not present

## 2020-06-03 DIAGNOSIS — L89616 Pressure-induced deep tissue damage of right heel: Secondary | ICD-10-CM | POA: Diagnosis not present

## 2020-06-03 DIAGNOSIS — T148XXA Other injury of unspecified body region, initial encounter: Secondary | ICD-10-CM | POA: Diagnosis not present

## 2020-06-25 DEATH — deceased
